# Patient Record
Sex: Male | Born: 1965 | Race: White | Hispanic: No | Marital: Married | Smoking: Former smoker
Health system: Southern US, Community
[De-identification: ages and names within clinical notes are randomized; demographics above are authoritative.]

## PROBLEM LIST (undated history)

## (undated) DIAGNOSIS — D649 Anemia, unspecified: Secondary | ICD-10-CM

## (undated) DIAGNOSIS — G4730 Sleep apnea, unspecified: Secondary | ICD-10-CM

## (undated) DIAGNOSIS — I10 Essential (primary) hypertension: Secondary | ICD-10-CM

## (undated) DIAGNOSIS — M549 Dorsalgia, unspecified: Secondary | ICD-10-CM

## (undated) DIAGNOSIS — M109 Gout, unspecified: Secondary | ICD-10-CM

## (undated) DIAGNOSIS — K5790 Diverticulosis of intestine, part unspecified, without perforation or abscess without bleeding: Secondary | ICD-10-CM

## (undated) DIAGNOSIS — F32A Depression, unspecified: Secondary | ICD-10-CM

## (undated) DIAGNOSIS — J449 Chronic obstructive pulmonary disease, unspecified: Secondary | ICD-10-CM

## (undated) DIAGNOSIS — K922 Gastrointestinal hemorrhage, unspecified: Secondary | ICD-10-CM

## (undated) DIAGNOSIS — R609 Edema, unspecified: Secondary | ICD-10-CM

## (undated) DIAGNOSIS — K219 Gastro-esophageal reflux disease without esophagitis: Secondary | ICD-10-CM

## (undated) DIAGNOSIS — E119 Type 2 diabetes mellitus without complications: Secondary | ICD-10-CM

## (undated) DIAGNOSIS — E785 Hyperlipidemia, unspecified: Secondary | ICD-10-CM

## (undated) DIAGNOSIS — D509 Iron deficiency anemia, unspecified: Secondary | ICD-10-CM

## (undated) DIAGNOSIS — F419 Anxiety disorder, unspecified: Secondary | ICD-10-CM

## (undated) DIAGNOSIS — M7989 Other specified soft tissue disorders: Secondary | ICD-10-CM

## (undated) DIAGNOSIS — F17200 Nicotine dependence, unspecified, uncomplicated: Secondary | ICD-10-CM

## (undated) HISTORY — PX: FOOT FASCIOTOMY: SHX1659

## (undated) HISTORY — DX: Dorsalgia, unspecified: M54.9

## (undated) HISTORY — DX: Edema, unspecified: R60.9

## (undated) HISTORY — DX: Hyperlipidemia, unspecified: E78.5

## (undated) HISTORY — DX: Other specified soft tissue disorders: M79.89

## (undated) HISTORY — DX: Anemia, unspecified: D64.9

## (undated) HISTORY — PX: COLONOSCOPY: SHX174

## (undated) HISTORY — DX: Depression, unspecified: F32.A

---

## 2005-06-02 HISTORY — PX: ORIF RADIUS & ULNA FRACTURES: SHX2129

## 2005-07-11 ENCOUNTER — Emergency Department (HOSPITAL_COMMUNITY): Admission: EM | Admit: 2005-07-11 | Discharge: 2005-07-11 | Payer: Self-pay | Admitting: Emergency Medicine

## 2005-08-21 ENCOUNTER — Emergency Department (HOSPITAL_COMMUNITY): Admission: EM | Admit: 2005-08-21 | Discharge: 2005-08-22 | Payer: Self-pay | Admitting: Emergency Medicine

## 2011-04-01 ENCOUNTER — Emergency Department (HOSPITAL_COMMUNITY): Payer: Self-pay

## 2011-04-01 ENCOUNTER — Emergency Department (HOSPITAL_COMMUNITY)
Admission: EM | Admit: 2011-04-01 | Discharge: 2011-04-01 | Disposition: A | Discharge status: Home / Self Care | Payer: Self-pay | Attending: Emergency Medicine | Admitting: Emergency Medicine

## 2011-04-01 DIAGNOSIS — R0989 Other specified symptoms and signs involving the circulatory and respiratory systems: Secondary | ICD-10-CM | POA: Insufficient documentation

## 2011-04-01 DIAGNOSIS — R141 Gas pain: Secondary | ICD-10-CM | POA: Insufficient documentation

## 2011-04-01 DIAGNOSIS — E119 Type 2 diabetes mellitus without complications: Secondary | ICD-10-CM | POA: Insufficient documentation

## 2011-04-01 DIAGNOSIS — F17200 Nicotine dependence, unspecified, uncomplicated: Secondary | ICD-10-CM | POA: Insufficient documentation

## 2011-04-01 DIAGNOSIS — Z79899 Other long term (current) drug therapy: Secondary | ICD-10-CM | POA: Insufficient documentation

## 2011-04-01 DIAGNOSIS — R142 Eructation: Secondary | ICD-10-CM | POA: Insufficient documentation

## 2011-04-01 DIAGNOSIS — I10 Essential (primary) hypertension: Secondary | ICD-10-CM | POA: Insufficient documentation

## 2011-04-01 DIAGNOSIS — R0609 Other forms of dyspnea: Secondary | ICD-10-CM | POA: Insufficient documentation

## 2011-04-01 DIAGNOSIS — R0602 Shortness of breath: Secondary | ICD-10-CM | POA: Insufficient documentation

## 2011-04-01 DIAGNOSIS — J449 Chronic obstructive pulmonary disease, unspecified: Secondary | ICD-10-CM | POA: Insufficient documentation

## 2011-04-01 DIAGNOSIS — J4489 Other specified chronic obstructive pulmonary disease: Secondary | ICD-10-CM | POA: Insufficient documentation

## 2011-04-01 LAB — COMPREHENSIVE METABOLIC PANEL
Albumin: 3.5 g/dL (ref 3.5–5.2)
BUN: 15 mg/dL (ref 6–23)
CO2: 23 mEq/L (ref 19–32)
Calcium: 9.2 mg/dL (ref 8.4–10.5)
Chloride: 93 mEq/L — ABNORMAL LOW (ref 96–112)
Creatinine, Ser: 0.71 mg/dL (ref 0.50–1.35)
GFR calc non Af Amer: >90 mL/min (ref >90)
Total Bilirubin: 0.2 mg/dL — ABNORMAL LOW (ref 0.3–1.2)

## 2011-04-01 LAB — DIFFERENTIAL
Basophils Absolute: 0.1 10*3/uL (ref 0.0–0.1)
Basophils Relative: 1 % (ref 0–1)
Eosinophils Absolute: 0.6 10*3/uL (ref 0.0–0.7)
Lymphocytes Relative: 21 % (ref 12–46)
Lymphs Abs: 2.3 10*3/uL (ref 0.7–4.0)
Monocytes Absolute: 0.7 10*3/uL (ref 0.1–1.0)
Monocytes Relative: 6 % (ref 3–12)
Neutro Abs: 7.3 10*3/uL (ref 1.7–7.7)
Neutrophils Relative %: 67 % (ref 43–77)

## 2011-04-01 LAB — CBC
Hemoglobin: 14.8 g/dL (ref 13.0–17.0)
MCV: 94.7 fL (ref 78.0–100.0)
WBC: 10.9 10*3/uL — ABNORMAL HIGH (ref 4.0–10.5)

## 2011-04-01 LAB — CK TOTAL AND CKMB (NOT AT ARMC): CK, MB: 2.3 ng/mL (ref 0.3–4.0)

## 2011-07-18 DIAGNOSIS — E119 Type 2 diabetes mellitus without complications: Secondary | ICD-10-CM | POA: Insufficient documentation

## 2014-03-01 ENCOUNTER — Emergency Department (HOSPITAL_COMMUNITY): Payer: 59

## 2014-03-01 ENCOUNTER — Emergency Department (HOSPITAL_COMMUNITY)
Admission: EM | Admit: 2014-03-01 | Discharge: 2014-03-01 | Disposition: A | Discharge status: Home / Self Care | Payer: 59 | Attending: Emergency Medicine | Admitting: Emergency Medicine

## 2014-03-01 ENCOUNTER — Encounter (HOSPITAL_COMMUNITY): Payer: Self-pay | Admitting: Emergency Medicine

## 2014-03-01 DIAGNOSIS — Z79899 Other long term (current) drug therapy: Secondary | ICD-10-CM | POA: Insufficient documentation

## 2014-03-01 DIAGNOSIS — F17200 Nicotine dependence, unspecified, uncomplicated: Secondary | ICD-10-CM | POA: Diagnosis not present

## 2014-03-01 DIAGNOSIS — I10 Essential (primary) hypertension: Secondary | ICD-10-CM | POA: Diagnosis not present

## 2014-03-01 DIAGNOSIS — E119 Type 2 diabetes mellitus without complications: Secondary | ICD-10-CM | POA: Insufficient documentation

## 2014-03-01 DIAGNOSIS — Z7982 Long term (current) use of aspirin: Secondary | ICD-10-CM | POA: Insufficient documentation

## 2014-03-01 DIAGNOSIS — M109 Gout, unspecified: Secondary | ICD-10-CM | POA: Diagnosis not present

## 2014-03-01 DIAGNOSIS — R51 Headache: Secondary | ICD-10-CM | POA: Insufficient documentation

## 2014-03-01 DIAGNOSIS — J0100 Acute maxillary sinusitis, unspecified: Secondary | ICD-10-CM | POA: Insufficient documentation

## 2014-03-01 HISTORY — DX: Essential (primary) hypertension: I10

## 2014-03-01 HISTORY — DX: Gout, unspecified: M10.9

## 2014-03-01 HISTORY — DX: Type 2 diabetes mellitus without complications: E11.9

## 2014-03-01 LAB — CBC WITH DIFFERENTIAL/PLATELET
BASOS ABS: 0.1 10*3/uL (ref 0.0–0.1)
BASOS PCT: 0 % (ref 0–1)
Eosinophils Absolute: 0.4 10*3/uL (ref 0.0–0.7)
Eosinophils Relative: 3 % (ref 0–5)
HEMATOCRIT: 40.1 % (ref 39.0–52.0)
Hemoglobin: 13.8 g/dL (ref 13.0–17.0)
Lymphocytes Relative: 12 % (ref 12–46)
Lymphs Abs: 1.8 10*3/uL (ref 0.7–4.0)
MCH: 30.9 pg (ref 26.0–34.0)
MCHC: 34.4 g/dL (ref 30.0–36.0)
MCV: 89.9 fL (ref 78.0–100.0)
MONOS PCT: 10 % (ref 3–12)
Monocytes Absolute: 1.4 10*3/uL — ABNORMAL HIGH (ref 0.1–1.0)
Neutro Abs: 11.1 10*3/uL — ABNORMAL HIGH (ref 1.7–7.7)
Neutrophils Relative %: 75 % (ref 43–77)
PLATELETS: 321 10*3/uL (ref 150–400)
RBC: 4.46 MIL/uL (ref 4.22–5.81)
RDW: 13.9 % (ref 11.5–15.5)
WBC: 14.8 10*3/uL — AB (ref 4.0–10.5)

## 2014-03-01 LAB — BASIC METABOLIC PANEL
ANION GAP: 16 — AB (ref 5–15)
BUN: 8 mg/dL (ref 6–23)
CALCIUM: 8.6 mg/dL (ref 8.4–10.5)
CHLORIDE: 95 meq/L — AB (ref 96–112)
CO2: 26 meq/L (ref 19–32)
Creatinine, Ser: 0.67 mg/dL (ref 0.50–1.35)
GFR calc Af Amer: >90 mL/min (ref >90)
GFR calc non Af Amer: >90 mL/min (ref >90)
Glucose, Bld: 166 mg/dL — ABNORMAL HIGH (ref 70–99)
POTASSIUM: 3.2 meq/L — AB (ref 3.7–5.3)
Sodium: 137 mEq/L (ref 137–147)

## 2014-03-01 MED ORDER — DIPHENHYDRAMINE HCL 50 MG/ML IJ SOLN
12.5000 mg | Freq: Once | INTRAMUSCULAR | Status: AC
  Administered 2014-03-01: 12.5 mg via INTRAVENOUS
  Filled 2014-03-01: qty 1

## 2014-03-01 MED ORDER — AMLODIPINE BESYLATE 5 MG PO TABS
5.0000 mg | ORAL_TABLET | Freq: Once | ORAL | Status: AC
  Administered 2014-03-01: 5 mg via ORAL
  Filled 2014-03-01: qty 1

## 2014-03-01 MED ORDER — PROCHLORPERAZINE EDISYLATE 5 MG/ML IJ SOLN
10.0000 mg | Freq: Once | INTRAMUSCULAR | Status: AC
  Administered 2014-03-01: 10 mg via INTRAVENOUS
  Filled 2014-03-01: qty 2

## 2014-03-01 MED ORDER — MORPHINE SULFATE 4 MG/ML IJ SOLN
4.0000 mg | Freq: Once | INTRAMUSCULAR | Status: AC
  Administered 2014-03-01: 4 mg via INTRAVENOUS
  Filled 2014-03-01: qty 1

## 2014-03-01 MED ORDER — HYDROCODONE-ACETAMINOPHEN 5-325 MG PO TABS: 1.0000 | ORAL_TABLET | ORAL | Status: DC | PRN

## 2014-03-01 MED ORDER — AMOXICILLIN-POT CLAVULANATE 875-125 MG PO TABS: 1.0000 | ORAL_TABLET | Freq: Two times a day (BID) | ORAL | Status: DC

## 2014-03-01 NOTE — ED Provider Notes (Signed)
CSN: [id number]     Arrival date & time 03/01/14  1907 History   First MD Initiated Contact with Patient 03/01/14 1941     Chief Complaint  Patient presents with  . Headache      HPI Patient presents to emergency room with complaints of a headache started several days ago. The headache is on the left side of his face. It was gradual in onset and has been increasing in severity. The pain is aching and intense like a toothache. Following his nose seems to make the pain worse. He has not had any fevers. He denies any neck pain. He denies any numbness or weakness. Patient does have a history of hypertension. He did take his medications today. Past Medical History  Diagnosis Date  . Hypertension   . Gout   . Diabetes mellitus without complication    History reviewed. No pertinent past surgical history. History reviewed. No pertinent family history. History  Substance Use Topics  . Smoking status: Current Every Day Smoker  . Smokeless tobacco: Not on file  . Alcohol Use: Yes    Review of Systems  All other systems reviewed and are negative.     Allergies  Review of patient's allergies indicates no known allergies.  Home Medications   Prior to Admission medications   Medication Sig Start Date End Date Taking? Authorizing Provider  acetaminophen-codeine (TYLENOL #3) 300-30 MG per tablet Take 1-2 tablets by mouth every 4 (four) hours as needed for moderate pain.   Yes Historical Provider, MD  allopurinol (ZYLOPRIM) 300 MG tablet Take 300 mg by mouth daily.   Yes Historical Provider, MD  ALPRAZolam Prudy Feeler) 0.5 MG tablet Take 0.5 mg by mouth at bedtime as needed for anxiety.   Yes Historical Provider, MD  amLODipine-atorvastatin (CADUET) 5-20 MG per tablet Take 1 tablet by mouth 2 (two) times daily.   Yes Historical Provider, MD  aspirin 81 MG tablet Take 81 mg by mouth daily.   Yes Historical Provider, MD  atorvastatin (LIPITOR) 10 MG tablet Take 10 mg by mouth daily.   Yes  Historical Provider, MD  azithromycin (ZITHROMAX) 250 MG tablet Take 250 mg by mouth See admin instructions. For 4 days. Completed on 02/25/14   Yes Historical Provider, MD  carvedilol (COREG) 25 MG tablet Take 25 mg by mouth 2 (two) times daily with a meal.   Yes Historical Provider, MD  furosemide (LASIX) 80 MG tablet Take 80 mg by mouth 2 (two) times daily.   Yes Historical Provider, MD  gabapentin (NEURONTIN) 600 MG tablet Take 600 mg by mouth at bedtime.   Yes Historical Provider, MD  glipiZIDE (GLUCOTROL) 5 MG tablet Take 5 mg by mouth 2 (two) times daily.   Yes Historical Provider, MD  losartan (COZAAR) 50 MG tablet Take 50 mg by mouth daily.   Yes Historical Provider, MD  metFORMIN (GLUCOPHAGE) 500 MG tablet Take 500 mg by mouth every morning.   Yes Historical Provider, MD  omeprazole (PRILOSEC) 40 MG capsule Take 40 mg by mouth daily.   Yes Historical Provider, MD  potassium chloride SA (K-DUR,KLOR-CON) 20 MEQ tablet Take 20 mEq by mouth 2 (two) times daily.   Yes Historical Provider, MD  amoxicillin-clavulanate (AUGMENTIN) 875-125 MG per tablet Take 1 tablet by mouth 2 (two) times daily. 03/01/14   Linwood Dibbles, MD  HYDROcodone-acetaminophen (NORCO/VICODIN) 5-325 MG per tablet Take 1-2 tablets by mouth every 4 (four) hours as needed. 03/01/14   Linwood Dibbles, MD   BP  208/87  Pulse 96  Temp(Src) 99.5 F (37.5 C) (Oral)  Resp 17  SpO2 97% Physical Exam  Nursing note and vitals reviewed. Constitutional: He appears well-developed and well-nourished. No distress.  Obese  HENT:  Head: Normocephalic and atraumatic.  Right Ear: External ear normal.  Left Ear: External ear normal.  No sinus tenderness  Eyes: Conjunctivae are normal. Right eye exhibits no discharge. Left eye exhibits no discharge. No scleral icterus.  Neck: Neck supple. No tracheal deviation present.  Cardiovascular: Normal rate, regular rhythm and intact distal pulses.   Pulmonary/Chest: Effort normal and breath sounds normal.  No stridor. No respiratory distress. He has no wheezes. He has no rales.  Abdominal: Soft. Bowel sounds are normal. He exhibits no distension. There is no tenderness. There is no rebound and no guarding.  Musculoskeletal: He exhibits no edema and no tenderness.  Neurological: He is alert. He has normal strength. No cranial nerve deficit (no facial droop, extraocular movements intact, no slurred speech) or sensory deficit. He exhibits normal muscle tone. He displays no seizure activity. Coordination normal.  Skin: Skin is warm and dry. No rash noted.  Psychiatric: He has a normal mood and affect.    ED Course  Procedures (including critical care time) Labs Review Labs Reviewed  CBC WITH DIFFERENTIAL - Abnormal; Notable for the following:    WBC 14.8 (*)    Neutro Abs 11.1 (*)    Monocytes Absolute 1.4 (*)    All other components within normal limits  BASIC METABOLIC PANEL - Abnormal; Notable for the following:    Potassium 3.2 (*)    Chloride 95 (*)    Glucose, Bld 166 (*)    Anion gap 16 (*)    All other components within normal limits    Imaging Review Ct Head Wo Contrast  03/01/2014   CLINICAL DATA:  Left-sided headache with sensation of facial pressure  EXAM: CT HEAD WITHOUT CONTRAST  TECHNIQUE: Contiguous axial images were obtained from the base of the skull through the vertex without intravenous contrast.  COMPARISON:  None.  FINDINGS: The ventricles are normal in size and position. There is no intracranial hemorrhage nor intracranial mass effect. There is subtle decreased density at the gray - white matter junction in the right frontal lobe demonstrated on images 21 through 23 which likely reflects chronic small vessel ischemic change. There is no acute intracranial hemorrhage. The cerebellum and brainstem are unremarkable.  There is a large amount of fluid filling the left maxillary and left ethmoid sinus cells. There is mucoperiosteal thickening within the left sphenoid sinus. The  frontal sinuses are clear. The mastoid air cells are well pneumatized. The calvarium is intact.  IMPRESSION: 1. Acute left maxillary, ethmoid, and sphenoid sinusitis. No bony erosive changes are demonstrated. 2. There is no acute intracranial abnormality. Hypodensity in the deep white matter of the right frontal lobe is consistent with chronic small vessel ischemic change which Vincelette be related to the patient's hypertension.   Electronically Signed   By: Arron  Swazil[place]   On: 03/01/2014 21:15    Medications  prochlorperazine (COMPAZINE) injection 10 mg (10 mg Intravenous Given 03/01/14 2027)  morphine 4 MG/ML injection 4 mg (4 mg Intravenous Given 03/01/14 2027)  amLODipine (NORVASC) tablet 5 mg (5 mg Oral Given 03/01/14 2038)  diphenhydrAMINE (BENADRYL) injection 12.5 mg (12.5 mg Intravenous Given 03/01/14 2130)     MDM   Final diagnoses:  Acute maxillary sinusitis, recurrence not specified    CT scan shows  acute sinusitis which correlates with his symptoms.  Pt has been on azithro and pcn recently for a dental infection.  Will dc home on augmentin and pain meds.    Linwood Dibbles, MD 03/01/14 2145

## 2014-03-01 NOTE — Discharge Instructions (Signed)

## 2014-03-01 NOTE — ED Notes (Signed)
Two weeks ago patient had an infected tooth, was treated with antibiotics and had a root canal, now he has head cold sx and is short of breath, pt's blood pressure is elevated in triage.

## 2014-03-01 NOTE — ED Notes (Signed)
Patient transported to CT 

## 2014-04-12 ENCOUNTER — Emergency Department (HOSPITAL_COMMUNITY)
Admission: EM | Admit: 2014-04-12 | Discharge: 2014-04-13 | Disposition: A | Discharge status: Home / Self Care | Payer: 59 | Attending: Emergency Medicine | Admitting: Emergency Medicine

## 2014-04-12 ENCOUNTER — Encounter (HOSPITAL_COMMUNITY): Payer: Self-pay

## 2014-04-12 DIAGNOSIS — E669 Obesity, unspecified: Secondary | ICD-10-CM | POA: Insufficient documentation

## 2014-04-12 DIAGNOSIS — M79605 Pain in left leg: Secondary | ICD-10-CM | POA: Diagnosis present

## 2014-04-12 DIAGNOSIS — Z720 Tobacco use: Secondary | ICD-10-CM | POA: Insufficient documentation

## 2014-04-12 DIAGNOSIS — I10 Essential (primary) hypertension: Secondary | ICD-10-CM | POA: Diagnosis not present

## 2014-04-12 DIAGNOSIS — M109 Gout, unspecified: Secondary | ICD-10-CM | POA: Insufficient documentation

## 2014-04-12 DIAGNOSIS — Z7982 Long term (current) use of aspirin: Secondary | ICD-10-CM | POA: Insufficient documentation

## 2014-04-12 DIAGNOSIS — I82402 Acute embolism and thrombosis of unspecified deep veins of left lower extremity: Secondary | ICD-10-CM | POA: Diagnosis not present

## 2014-04-12 DIAGNOSIS — Z79899 Other long term (current) drug therapy: Secondary | ICD-10-CM | POA: Diagnosis not present

## 2014-04-12 DIAGNOSIS — E119 Type 2 diabetes mellitus without complications: Secondary | ICD-10-CM | POA: Diagnosis not present

## 2014-04-12 DIAGNOSIS — Z792 Long term (current) use of antibiotics: Secondary | ICD-10-CM | POA: Insufficient documentation

## 2014-04-12 MED ORDER — ENOXAPARIN SODIUM 80 MG/0.8ML ~~LOC~~ SOLN
80.0000 mg | Freq: Once | SUBCUTANEOUS | Status: AC
  Administered 2014-04-13: 80 mg via SUBCUTANEOUS
  Filled 2014-04-12: qty 0.8

## 2014-04-12 MED ORDER — HYDROCODONE-ACETAMINOPHEN 5-325 MG PO TABS
2.0000 | ORAL_TABLET | Freq: Once | ORAL | Status: AC
  Administered 2014-04-12: 2 via ORAL
  Filled 2014-04-12: qty 2

## 2014-04-12 MED ORDER — HYDROCODONE-ACETAMINOPHEN 5-325 MG PO TABS: 1.0000 | ORAL_TABLET | Freq: Two times a day (BID) | ORAL | Status: DC | PRN

## 2014-04-12 MED ORDER — ENOXAPARIN SODIUM 150 MG/ML ~~LOC~~ SOLN: 1.0000 mg/kg | Freq: Once | SUBCUTANEOUS | Status: DC

## 2014-04-12 MED ORDER — ENOXAPARIN SODIUM 100 MG/ML ~~LOC~~ SOLN
85.0000 mg | Freq: Once | SUBCUTANEOUS | Status: AC
  Administered 2014-04-13: 85 mg via SUBCUTANEOUS
  Filled 2014-04-12: qty 1

## 2014-04-12 NOTE — ED Notes (Signed)
Pt complains of pain behind his left knee sown his leg, he's had a Baker's cyst before and says it feels the same, this am he states it was hard to apply pressure on that leg.

## 2014-04-12 NOTE — Discharge Instructions (Signed)
Raymond [name] Raymond [name], you were seen today for leg pain and swelling. You Wildey have a blood clot and need to follow-up with radiology tomorrow for an ultrasound. [facility] for the study. This [name] also be a Raymond's [name] which will be seen on ultrasound. If you have any worsening symptoms, chest pain or shortness of breath come back to emergency department and medially for repeat evaluation. Thank you. A Raymond [name] is a sac-like structure that forms in the back of the knee. It is filled with the same fluid that is located in your knee. This fluid lubricates the bones and cartilage of the knee and allows them to move over each other more easily. CAUSES  When the knee becomes injured or inflamed, increased fluid forms in the knee. When this happens, the joint lining is pushed out behind the knee and forms the Raymond [name]. This [name] also be caused by inflammation from arthritic conditions and infections. SIGNS AND SYMPTOMS  A Raymond [name] usually has no symptoms. When the [name] is substantially enlarged:  You [name] feel pressure behind the knee, stiffness in the knee, or a mass in the area behind the knee.  You [name] develop pain, redness, and swelling in the calf. This can suggest a blood clot and requires evaluation by your health care provider. DIAGNOSIS  A Raymond [name] is most often found during an ultrasound exam. This exam Eastmond have been performed for other reasons, and the [name] was found incidentally. Sometimes an MRI is used. This picks up other problems within a joint that an ultrasound exam [name] not. If the Raymond [name] developed immediately after an injury, X-ray exams [name] be used to diagnose the [name]. TREATMENT  The treatment depends on the cause of the [name]. Anti-inflammatory medicines and rest often will be prescribed. If the [name] is caused by a bacterial infection, antibiotic medicines [name] be prescribed.  HOME CARE INSTRUCTIONS   If the [name] was caused by an  injury, for the first 24 hours, keep the injured leg elevated on 2 pillows while lying down.  For the first 24 hours while you are awake, apply ice to the injured area:  Put ice in a plastic bag.  Place a towel between your skin and the bag.  Leave the ice on for 20 minutes, 2-3 times a day.  Only take over-the-counter or prescription medicines for pain, discomfort, or fever as directed by your health care provider.  Only take antibiotic medicine as directed. Make sure to finish it even if you start to feel better. MAKE SURE YOU:   Understand these instructions.  Will watch your condition.  Will get help right away if you are not doing well or get worse. Document Released: 05/19/2005 Document Revised: 03/09/2013 Document Reviewed: 12/29/2012 [facility] Patient Information 2015 [place], [place]. This information is not intended to replace advice given to you by your health care provider. Make sure you discuss any questions you have with your health care provider. Deep Vein Thrombosis A deep vein thrombosis (DVT) is a blood clot that develops in the deep, larger veins of the leg, arm, or pelvis. These are more dangerous than clots that might form in veins near the surface of the body. A DVT can lead to serious and even life-threatening complications if the clot breaks off and travels in the bloodstream to the lungs.  A DVT can damage the valves in your leg veins so that instead of flowing upward,  the blood pools in the lower leg. This is called post-thrombotic syndrome, and it can result in pain, swelling, discoloration, and sores on the leg. CAUSES Usually, several things contribute to the formation of blood clots. Contributing factors include:  The flow of blood slows down.  The inside of the vein is damaged in some way.  You have a condition that makes blood clot more easily. RISK FACTORS Some people are more likely than others to develop blood clots. Risk factors include:    Smoking.  Being overweight (obese).  Sitting or lying still for a long time. This includes long-distance travel, paralysis, or recovery from an illness or surgery. Other factors that increase risk are:   Older age, especially over 50 years of age.  Having a family history of blood clots or if you have already had a blot clot.  Having major or lengthy surgery. This is especially true for surgery on the hip, knee, or belly (abdomen). Hip surgery is particularly high risk.  Having a long, thin tube (catheter) placed inside a vein during a medical procedure.  Breaking a hip or leg.  Having cancer or cancer treatment.  Pregnancy and childbirth.  Hormone changes make the blood clot more easily during pregnancy.  The fetus puts pressure on the veins of the pelvis.  There is a risk of injury to veins during delivery or a caesarean delivery. The risk is highest just after childbirth.  Medicines containing the male hormone estrogen. This includes birth control pills and hormone replacement therapy.  Other circulation or heart problems.  SIGNS AND SYMPTOMS When a clot forms, it can either partially or totally block the blood flow in that vein. Symptoms of a DVT can include:  Swelling of the leg or arm, especially if one side is much worse.  Warmth and redness of the leg or arm, especially if one side is much worse.  Pain in an arm or leg. If the clot is in the leg, symptoms Deleonardis be more noticeable or worse when standing or walking. The symptoms of a DVT that has traveled to the lungs (pulmonary embolism, PE) usually start suddenly and include:  Shortness of breath.  Coughing.  Coughing up blood or blood-tinged mucus.  Chest pain. The chest pain is often worse with deep breaths.  Rapid heartbeat. Anyone with these symptoms should get emergency medical treatment right away. Do not wait to see if the symptoms will go away. Call your local emergency services (911 in the [place]  if you have these symptoms. Do not drive yourself to the hospital. DIAGNOSIS If a DVT is suspected, your health care provider will take a full medical history and perform a physical exam. Tests that also Towner be required include:  Blood tests, including studies of the clotting properties of the blood.  Ultrasound to see if you have clots in your legs or lungs.  X-rays to show the flow of blood when dye is injected into the veins (venogram).  Studies of your lungs if you have any chest symptoms. PREVENTION  Exercise the legs regularly. Take a brisk 30-minute walk every day.  Maintain a weight that is appropriate for your height.  Avoid sitting or lying in bed for long periods of time without moving your legs.  Women, particularly those over the age of 35 years, should consider the risks and benefits of taking estrogen medicines, including birth control pills.  Do not smoke, especially if you take estrogen medicines.  Long-distance travel can increase your  risk of DVT. You should exercise your legs by walking or pumping the muscles every hour.  Many of the risk factors above relate to situations that exist with hospitalization, either for illness, injury, or elective surgery. Prevention Jaggers include medical and nonmedical measures.  Your health care provider will assess you for the need for venous thromboembolism prevention when you are admitted to the hospital. If you are having surgery, your surgeon will assess you the day of or day after surgery. TREATMENT Once identified, a DVT can be treated. It can also be prevented in some circumstances. Once you have had a DVT, you Maler be at increased risk for a DVT in the future. The most common treatment for DVT is blood-thinning (anticoagulant) medicine, which reduces the blood's tendency to clot. Anticoagulants can stop new blood clots from forming and stop old clots from growing. They cannot dissolve existing clots. Your body does this by itself  over time. Anticoagulants can be given by mouth, through an IV tube, or by injection. Your health care provider will determine the best program for you. Other medicines or treatments that Rylander be used are:  Heparin or related medicines (low molecular weight heparin) are often the first treatment for a blood clot. They act quickly. However, they cannot be taken orally and must be given either in shot form or by IV tube.  Heparin can cause a fall in a component of blood that stops bleeding and forms blood clots (platelets). You will be monitored with blood tests to be sure this does not occur.  Warfarin is an anticoagulant that can be swallowed. It takes a few days to start working, so usually heparin or related medicines are used in combination. Once warfarin is working, heparin is usually stopped.  Factor Xa inhibitor medicines, such as rivaroxaban and apixaban, also reduce blood clotting. These medicines are taken orally and can often be used without heparin or related medicines.  Less commonly, clot dissolving drugs (thrombolytics) are used to dissolve a DVT. They carry a high risk of bleeding, so they are used mainly in severe cases where your life or a part of your body is threatened.  Very rarely, a blood clot in the leg needs to be removed surgically.  If you are unable to take anticoagulants, your health care provider Flener arrange for you to have a filter placed in a main vein in your abdomen. This filter prevents clots from traveling to your lungs. HOME CARE INSTRUCTIONS  Take all medicines as directed by your health care provider.  Learn as much as you can about DVT.  Wear a medical alert bracelet or carry a medical alert card.  Ask your health care provider how soon you can go back to normal activities. It is important to stay active to prevent blood clots. If you are on anticoagulant medicine, avoid contact sports.  It is very important to exercise. This is especially important  while traveling, sitting, or standing for long periods of time. Exercise your legs by walking or by tightening and relaxing your leg muscles regularly. Take frequent walks.  You Wrubel need to wear compression stockings. These are tight elastic stockings that apply pressure to the lower legs. This pressure can help keep the blood in the legs from clotting. Taking Warfarin Warfarin is a daily medicine that is taken by mouth. Your health care provider will advise you on the length of treatment (usually 3-6 months, sometimes lifelong). If you take warfarin:  Understand how to take warfarin  and foods that can affect how warfarin works in Public relations account executive.  Too much and too little warfarin are both dangerous. Too much warfarin increases the risk of bleeding. Too little warfarin continues to allow the risk for blood clots. Warfarin and Regular Blood Testing While taking warfarin, you will need to have regular blood tests to measure your blood clotting time. These blood tests usually include both the prothrombin time (PT) and international normalized ratio (INR) tests. The PT and INR results allow your health care provider to adjust your dose of warfarin. It is very important that you have your PT and INR tested as often as directed by your health care provider.  Warfarin and Your Diet Avoid major changes in your diet, or notify your health care provider before changing your diet. Arrange a visit with a registered dietitian to answer your questions. Many foods, especially foods high in vitamin K, can interfere with warfarin and affect the PT and INR results. You should eat a consistent amount of foods high in vitamin K. Foods high in vitamin K include:   Spinach, kale, broccoli, cabbage, collard and turnip greens, Brussels sprouts, peas, cauliflower, seaweed, and parsley.  Beef and pork liver.  Green tea.  Soybean oil. Warfarin with Other Medicines Many medicines can interfere with warfarin and affect the PT  and INR results. You must:  Tell your health care provider about any and all medicines, vitamins, and supplements you take, including aspirin and other over-the-counter anti-inflammatory medicines. Be especially cautious with aspirin and anti-inflammatory medicines. Ask your health care provider before taking these.  Do not take or discontinue any prescribed or over-the-counter medicine except on the advice of your health care provider or pharmacist. Warfarin Side Effects Warfarin can have side effects, such as easy bruising and difficulty stopping bleeding. Ask your health care provider or pharmacist about other side effects of warfarin. You will need to:  Hold pressure over cuts for longer than usual.  Notify your dentist and other health care providers that you are taking warfarin before you undergo any procedures where bleeding Krzywicki occur. Warfarin with Alcohol and Tobacco   Drinking alcohol frequently can increase the effect of warfarin, leading to excess bleeding. It is best to avoid alcoholic drinks or to consume only very small amounts while taking warfarin. Notify your health care provider if you change your alcohol intake.   Do not use any tobacco products including cigarettes, chewing tobacco, or electronic cigarettes. If you smoke, quit. Ask your health care provider for help with quitting smoking. Alternative Medicines to Warfarin: Factor Xa Inhibitor Medicines  These blood-thinning medicines are taken by mouth, usually for several weeks or longer. It is important to take the medicine every single day at the same time each day.  There are no regular blood tests required when using these medicines.  There are fewer food and drug interactions than with warfarin.  The side effects of this class of medicine are similar to those of warfarin, including excessive bruising or bleeding. Ask your health care provider or pharmacist about other potential side effects. SEEK MEDICAL CARE  IF:  You notice a rapid heartbeat.  You feel weaker or more tired than usual.  You feel faint.  You notice increased bruising.  You feel your symptoms are not getting better in the time expected.  You believe you are having side effects of medicine. SEEK IMMEDIATE MEDICAL CARE IF:  You have chest pain.  You have trouble breathing.  You have new or  increased swelling or pain in one leg.  You cough up blood.  You notice blood in vomit, in a bowel movement, or in urine. MAKE SURE YOU:  Understand these instructions.  Will watch your condition.  Will get help right away if you are not doing well or get worse. Document Released: 05/19/2005 Document Revised: 10/03/2013 Document Reviewed: 01/24/2013 [facility] Patient Information 2015 [place], [place]. This information is not intended to replace advice given to you by your health care provider. Make sure you discuss any questions you have with your health care provider.

## 2014-04-12 NOTE — ED Provider Notes (Signed)
CSN: [id number]     Arrival date & time 04/12/14  2217 History   First MD Initiated Contact with Patient 04/12/14 2258     Chief Complaint  Patient presents with  . Leg Pain     (Consider location/radiation/quality/duration/timing/severity/associated sxs/prior Treatment) HPI  Raymond [name] is a 48 y.o. male with past medical history of hypertension, diabetes coming in with left leg pain. Patient states it is gradually gotten worse over the past week. He describes pain in and around his knee. He denies any trauma. He states he's had a Baker's cyst 3 or 4 years ago and this feels similar. 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 He recently just returned from [place] this morning. He is tried Advil and Goody powder without any relief. Patient is denying any chest pain or shortness of breath. He has no further complaints.  10 Systems reviewed and are negative for acute change except as noted in the HPI.     Past Medical History  Diagnosis Date  . Hypertension   . Gout   . Diabetes mellitus without complication    History reviewed. No pertinent past surgical history. History reviewed. No pertinent family history. History  Substance Use Topics  . Smoking status: Current Every Day Smoker  . Smokeless tobacco: Not on file  . Alcohol Use: Yes    Review of Systems    Allergies  Review of patient's allergies indicates no known allergies.  Home Medications   Prior to Admission medications   Medication Sig Start Date End Date Taking? Authorizing Provider  acetaminophen-codeine (TYLENOL #3) 300-30 MG per tablet Take 1-2 tablets by mouth every 4 (four) hours as needed for moderate pain.    Historical Provider, MD  allopurinol (ZYLOPRIM) 300 MG tablet Take 300 mg by mouth daily.    Historical Provider, MD  ALPRAZolam Prudy Feeler(XANAX) 0.5 MG tablet Take 0.5 mg by mouth at bedtime as needed for anxiety.    Historical Provider, MD  amLODipine-atorvastatin  (CADUET) 5-20 MG per tablet Take 1 tablet by mouth 2 (two) times daily.    Historical Provider, MD  amoxicillin-clavulanate (AUGMENTIN) 875-125 MG per tablet Take 1 tablet by mouth 2 (two) times daily. 03/01/14   Linwood DibblesJon Knapp, MD  aspirin 81 MG tablet Take 81 mg by mouth daily.    Historical Provider, MD  atorvastatin (LIPITOR) 10 MG tablet Take 10 mg by mouth daily.    Historical Provider, MD  azithromycin (ZITHROMAX) 250 MG tablet Take 250 mg by mouth See admin instructions. For 4 days. Completed on 02/25/14    Historical Provider, MD  carvedilol (COREG) 25 MG tablet Take 25 mg by mouth 2 (two) times daily with a meal.    Historical Provider, MD  furosemide (LASIX) 80 MG tablet Take 80 mg by mouth 2 (two) times daily.    Historical Provider, MD  gabapentin (NEURONTIN) 600 MG tablet Take 600 mg by mouth at bedtime.    Historical Provider, MD  glipiZIDE (GLUCOTROL) 5 MG tablet Take 5 mg by mouth 2 (two) times daily.    Historical Provider, MD  HYDROcodone-acetaminophen (NORCO/VICODIN) 5-325 MG per tablet Take 1-2 tablets by mouth every 4 (four) hours as needed. 03/01/14   Linwood DibblesJon Knapp, MD  losartan (COZAAR) 50 MG tablet Take 50 mg by mouth daily.    Historical Provider, MD  metFORMIN (GLUCOPHAGE) 500 MG tablet Take 500 mg by mouth every morning.    Historical Provider, MD  omeprazole (PRILOSEC) 40  MG capsule Take 40 mg by mouth daily.    Historical Provider, MD  potassium chloride SA (K-DUR,KLOR-CON) 20 MEQ tablet Take 20 mEq by mouth 2 (two) times daily.    Historical Provider, MD   BP 173/87 mmHg  Pulse 80  Temp(Src) 98.9 F (37.2 C) (Oral)  Resp 16  Ht 5\' 11"  (1.803 m)  Wt 365 lb (165.563 kg)  BMI 50.93 kg/m2  SpO2 97% Physical Exam  Constitutional: He is oriented to person, place, and time. Vital signs are normal. He appears well-developed and well-nourished.  Non-toxic appearance. He does not appear ill. No distress.  Obese male  HENT:  Head: Normocephalic and atraumatic.  Nose: Nose normal.   Mouth/Throat: Oropharynx is clear and moist. No oropharyngeal exudate.  Eyes: Conjunctivae and EOM are normal. Pupils are equal, round, and reactive to light. No scleral icterus.  Neck: Normal range of motion. Neck supple. No tracheal deviation, no edema, no erythema and normal range of motion present. No thyroid mass and no thyromegaly present.  Cardiovascular: Normal rate, regular rhythm, S1 normal, S2 normal, normal heart sounds, intact distal pulses and normal pulses.  Exam reveals no gallop and no friction rub.   No murmur heard. Pulses:      Radial pulses are 2+ on the right side, and 2+ on the left side.       Dorsalis pedis pulses are 2+ on the right side, and 2+ on the left side.  Pulmonary/Chest: Effort normal and breath sounds normal. No respiratory distress. He has no wheezes. He has no rhonchi. He has no rales.  Abdominal: Soft. Normal appearance and bowel sounds are normal. He exhibits no distension, no ascites and no mass. There is no hepatosplenomegaly. There is no tenderness. There is no rebound, no guarding and no CVA tenderness.  Musculoskeletal: Normal range of motion. He exhibits edema and tenderness.  Left lower extremity with 2+ edema to the knee. Positive calf tenderness.  Left knee is not warm, swollen.  Lymphadenopathy:    He has no cervical adenopathy.  Neurological: He is alert and oriented to person, place, and time. He has normal strength. No cranial nerve deficit or sensory deficit. GCS eye subscore is 4. GCS verbal subscore is 5. GCS motor subscore is 6.  Skin: Skin is warm, dry and intact. No petechiae and no rash noted. He is not diaphoretic. No erythema. No pallor.  Psychiatric: He has a normal mood and affect. His behavior is normal. Judgment normal.  Nursing note and vitals reviewed.   ED Course  Procedures (including critical care time) Labs Review Labs Reviewed - No data to display  Imaging Review No results found.   EKG Interpretation None       MDM   Final diagnoses:  None    Patient since emergency department for left foot show any pain and swelling. Although this Mimnaugh be a Baker's cyst, DVT is in the differential as well. 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 He was given Lovenox in the emergency department and advised to follow-up tomorrow for an ultrasound study.  I have low concern for septic knee as the joint is not hot or swollen. There is no significant tenderness to palpation.  His vital signs remain within normal limits and he is safe for discharge.    Tomasita CrumbleAdeleke , MD 04/12/14 [phone]

## 2014-04-13 ENCOUNTER — Ambulatory Visit (HOSPITAL_COMMUNITY)
Admission: RE | Admit: 2014-04-13 | Discharge: 2014-04-13 | Disposition: A | Discharge status: Home / Self Care | Payer: 59 | Source: Ambulatory Visit | Attending: Surgery | Admitting: Surgery

## 2014-04-13 DIAGNOSIS — M7122 Synovial cyst of popliteal space [Baker], left knee: Secondary | ICD-10-CM | POA: Diagnosis not present

## 2014-04-13 DIAGNOSIS — M7989 Other specified soft tissue disorders: Secondary | ICD-10-CM | POA: Diagnosis present

## 2014-04-13 DIAGNOSIS — R609 Edema, unspecified: Secondary | ICD-10-CM

## 2014-04-13 NOTE — Progress Notes (Signed)
VASCULAR LAB PRELIMINARY  PRELIMINARY  PRELIMINARY  PRELIMINARY  Left lower extremity venous duplex completed.    Preliminary report:  Left:  No evidence of DVT or superficial thrombosis.  Bakers cyst noted in the popliteal fossa and into the proximal calf.  Lymph node noted in the left groin.  , , RVT 04/13/2014, 3:24 PM

## 2014-04-16 ENCOUNTER — Encounter (HOSPITAL_COMMUNITY): Payer: Self-pay | Admitting: Emergency Medicine

## 2014-04-16 ENCOUNTER — Emergency Department (HOSPITAL_COMMUNITY)
Admission: EM | Admit: 2014-04-16 | Discharge: 2014-04-16 | Disposition: A | Discharge status: Home / Self Care | Payer: 59 | Attending: Emergency Medicine | Admitting: Emergency Medicine

## 2014-04-16 DIAGNOSIS — E119 Type 2 diabetes mellitus without complications: Secondary | ICD-10-CM | POA: Diagnosis not present

## 2014-04-16 DIAGNOSIS — Z79899 Other long term (current) drug therapy: Secondary | ICD-10-CM | POA: Insufficient documentation

## 2014-04-16 DIAGNOSIS — Z7982 Long term (current) use of aspirin: Secondary | ICD-10-CM | POA: Diagnosis not present

## 2014-04-16 DIAGNOSIS — I10 Essential (primary) hypertension: Secondary | ICD-10-CM | POA: Insufficient documentation

## 2014-04-16 DIAGNOSIS — M7122 Synovial cyst of popliteal space [Baker], left knee: Secondary | ICD-10-CM

## 2014-04-16 DIAGNOSIS — Z792 Long term (current) use of antibiotics: Secondary | ICD-10-CM | POA: Diagnosis not present

## 2014-04-16 DIAGNOSIS — M109 Gout, unspecified: Secondary | ICD-10-CM | POA: Insufficient documentation

## 2014-04-16 DIAGNOSIS — M25562 Pain in left knee: Secondary | ICD-10-CM | POA: Diagnosis present

## 2014-04-16 DIAGNOSIS — Z720 Tobacco use: Secondary | ICD-10-CM | POA: Diagnosis not present

## 2014-04-16 MED ORDER — OXYCODONE-ACETAMINOPHEN 5-325 MG PO TABS: 2.0000 | ORAL_TABLET | ORAL | Status: DC | PRN

## 2014-04-16 NOTE — Discharge Instructions (Signed)
Raymond [name] °A Raymond [name] is a sac-like structure that forms in the back of the knee. It is filled with the same fluid that is located in your knee. This fluid lubricates the bones and cartilage of the knee and allows them to move over each other more easily. °CAUSES  °When the knee becomes injured or inflamed, increased fluid forms in the knee. When this happens, the joint lining is pushed out behind the knee and forms the Raymond [name]. This [name] Mechling also be caused by inflammation from arthritic conditions and infections. °SIGNS AND SYMPTOMS  °A Raymond [name] usually has no symptoms. When the [name] is substantially enlarged: °· You Grave feel pressure behind the knee, stiffness in the knee, or a mass in the area behind the knee. °· You Hanrahan develop pain, redness, and swelling in the calf.  This can suggest a blood clot and requires evaluation by your health care provider. °DIAGNOSIS  °A Raymond [name] is most often found during an ultrasound exam. This exam Deblanc have been performed for other reasons, and the [name] was found incidentally. Sometimes an MRI is used. This picks up other problems within a joint that an ultrasound exam Zeiss not. If the Raymond [name] developed immediately after an injury, X-ray exams Sivertsen be used to diagnose the [name]. °TREATMENT  °The treatment depends on the cause of the [name]. Anti-inflammatory medicines and rest often will be prescribed. If the [name] is caused by a bacterial infection, antibiotic medicines Man be prescribed.  °HOME CARE INSTRUCTIONS  °· If the [name] was caused by an injury, for the first 24 hours, keep the injured leg elevated on 2 pillows while lying down. °· For the first 24 hours while you are awake, apply ice to the injured area: °¨ Put ice in a plastic bag. °¨ Place a towel between your skin and the bag. °¨ Leave the ice on for 20 minutes, 2-3 times a day. °· Only take over-the-counter or prescription medicines for pain, discomfort, or fever as directed by your health care  provider. °· Only take antibiotic medicine as directed. Make sure to finish it even if you start to feel better. °MAKE SURE YOU:  °· Understand these instructions. °· Will watch your condition. °· Will get help right away if you are not doing well or get worse. °Document Released: 05/19/2005 Document Revised: 03/09/2013 Document Reviewed: 12/29/2012 °[employer]® Patient Information ©2015 [employer]. This information is not intended to replace advice given to you by your health care provider. Make sure you discuss any questions you have with your health care provider. ° °

## 2014-04-16 NOTE — ED Notes (Signed)
Pt reports that he had an ultrasound on r/leg. Dx of "Bakers Cyst" . Pt has been staying off the leg since Friday.

## 2014-04-16 NOTE — ED Notes (Signed)
Pt ambulatory with crutches upon DC. He was advised to follow up with orthopedics. Pt left with prescription in hand

## 2014-04-16 NOTE — ED Provider Notes (Signed)
CSN: [id number]     Arrival date & time 04/16/14  1136 History   First MD Initiated Contact with Patient 04/16/14 1141     Chief Complaint  Patient presents with  . Cyst    cyst on l/leg , behind the knee   HPI  Patient is a 48 y.o. Male with PMH of HTN, Gout, and DM who presents to the ED with left knee pain.  Patient states that the left knee and into the left calf has been bothering him for approximately 1.5 weeks.  He describes the pain as an 8/10 achey stabbing pain that is worse with walking and movement.  Patient states that this feels like when he had a bakers cyst in the past on his other leg.  Patient had an ultrasound of the left leg on 04/13/14 which diagnosed bakers cyst.  Patient had no evidence of DVT at that time.  Patient has been taking hydrocodone 2 5/325 mg hydrocodone every 4-6 hours.    Past Medical History  Diagnosis Date  . Hypertension   . Gout   . Diabetes mellitus without complication    Past Surgical History  Procedure Laterality Date  . Arm surgery     Family History  Problem Relation Age of Onset  . Diabetes Mother   . Hypertension Mother   . Diabetes Father   . Hypertension Father   . Cancer Other    History  Substance Use Topics  . Smoking status: Current Every Day Smoker  . Smokeless tobacco: Not on file  . Alcohol Use: Yes    Review of Systems  Constitutional: Negative for fever, chills and fatigue.  Gastrointestinal: Negative for nausea and vomiting.  Musculoskeletal: Positive for joint swelling, arthralgias and gait problem. Negative for myalgias, back pain, neck pain and neck stiffness.  Skin: Negative for rash.  All other systems reviewed and are negative.     Allergies  Review of patient's allergies indicates no known allergies.  Home Medications   Prior to Admission medications   Medication Sig Start Date End Date Taking? Authorizing Provider  acetaminophen-codeine (TYLENOL #3) 300-30 MG per tablet Take 1-2 tablets by mouth  every 4 (four) hours as needed for moderate pain.    Historical Provider, MD  allopurinol (ZYLOPRIM) 300 MG tablet Take 300 mg by mouth daily.    Historical Provider, MD  ALPRAZolam Prudy Feeler) 0.5 MG tablet Take 0.5 mg by mouth at bedtime as needed for anxiety.    Historical Provider, MD  amLODipine-atorvastatin (CADUET) 5-20 MG per tablet Take 1 tablet by mouth 2 (two) times daily.    Historical Provider, MD  amoxicillin-clavulanate (AUGMENTIN) 875-125 MG per tablet Take 1 tablet by mouth 2 (two) times daily. 03/01/14   Linwood Dibbles, MD  aspirin 81 MG tablet Take 81 mg by mouth daily.    Historical Provider, MD  Aspirin-Acetaminophen-Caffeine [phone] MG PACK Take 1 packet by mouth every 4 (four) hours as needed (pain).    Historical Provider, MD  atorvastatin (LIPITOR) 10 MG tablet Take 10 mg by mouth at bedtime.     Historical Provider, MD  azithromycin (ZITHROMAX) 250 MG tablet Take 250 mg by mouth See admin instructions. For 4 days. Completed on 02/25/14    Historical Provider, MD  carvedilol (COREG) 25 MG tablet Take 25 mg by mouth 2 (two) times daily with a meal.    Historical Provider, MD  furosemide (LASIX) 80 MG tablet Take 80 mg by mouth 2 (two) times daily.  Historical Provider, MD  gabapentin (NEURONTIN) 600 MG tablet Take 600 mg by mouth at bedtime.    Historical Provider, MD  glipiZIDE (GLUCOTROL) 5 MG tablet Take 5 mg by mouth 2 (two) times daily.    Historical Provider, MD  losartan (COZAAR) 50 MG tablet Take 50 mg by mouth daily.    Historical Provider, MD  metFORMIN (GLUCOPHAGE) 500 MG tablet Take 1,000 mg by mouth every morning.     Historical Provider, MD  omeprazole (PRILOSEC) 40 MG capsule Take 40 mg by mouth daily.    Historical Provider, MD  oxyCODONE-acetaminophen (PERCOCET) 5-325 MG per tablet Take 2 tablets by mouth every 4 (four) hours as needed. 04/16/14    A Forcucci, PA-C  potassium chloride SA (K-DUR,KLOR-CON) 20 MEQ tablet Take 20 mEq by mouth 2 (two) times  daily.    Historical Provider, MD   BP 152/84 mmHg  Pulse 70  Temp(Src) 98.1 F (36.7 C) (Oral)  Resp 20  Wt 365 lb (165.563 kg)  SpO2 96% Physical Exam  Constitutional: He is oriented to person, place, and time. He appears well-developed and well-nourished. No distress.  HENT:  Head: Normocephalic and atraumatic.  Mouth/Throat: Oropharynx is clear and moist. No oropharyngeal exudate.  Eyes: Conjunctivae and EOM are normal. Pupils are equal, round, and reactive to light. No scleral icterus.  Neck: Normal range of motion. Neck supple. No JVD present. No thyromegaly present.  Cardiovascular: Normal rate, regular rhythm, normal heart sounds and intact distal pulses.  Exam reveals no gallop and no friction rub.   No murmur heard. Pulses:      Dorsalis pedis pulses are 2+ on the right side, and 2+ on the left side.       Posterior tibial pulses are 2+ on the right side, and 2+ on the left side.  Pulmonary/Chest: Effort normal and breath sounds normal. No respiratory distress. He has no wheezes. He has no rales. He exhibits no tenderness.  Musculoskeletal:       Left knee: He exhibits swelling and effusion. He exhibits normal range of motion, no ecchymosis, no deformity, no laceration, no erythema, normal alignment, no LCL laxity, normal patellar mobility, no bony tenderness, normal meniscus and no MCL laxity. Tenderness found. Medial joint line and lateral joint line tenderness noted. No MCL, no LCL and no patellar tendon tenderness noted.       Left ankle: Normal.       Legs: Lymphadenopathy:    He has no cervical adenopathy.  Neurological: He is alert and oriented to person, place, and time.  Skin: Skin is warm and dry. He is not diaphoretic.  Psychiatric: He has a normal mood and affect. His behavior is normal. Judgment and thought content normal.  Nursing note and vitals reviewed.   ED Course  Procedures (including critical care time) Labs Review Labs Reviewed - No data to  display  Imaging Review No results found.   EKG Interpretation None      MDM   Final diagnoses:  Baker's cyst, left   Patient is a 48 y.o. Male who presents to the ED with left knee pain.  Physical exam reveals a non-toxic appearing male with normal vital signs.  Left knee has full painful ROM with joint line tenderness and posterior tenderness.  Doubt septic joint.  Patient does have diagnosis of bakers cyst from ultrasound on 04/13/14.  Suspect that this is the likely cause of pain.  There is no sign of infection at this time.  Will  discharge the patient home with percocet and crutches and will have him follow-up with Dr. Lequita HaltAluisio his orthopedist. Patient to return for septic joint symptoms.  Patient states understanding and agreement.  Patient is stable for discharge.       Eben Burowourtney A Forcucci, PA-C 04/16/14 1223  Vida RollerBrian D Miller, MD 04/16/14 [phone]

## 2014-06-02 ENCOUNTER — Emergency Department (HOSPITAL_COMMUNITY)
Admission: EM | Admit: 2014-06-02 | Discharge: 2014-06-02 | Disposition: A | Discharge status: Home / Self Care | Payer: PRIVATE HEALTH INSURANCE | Source: Home / Self Care | Attending: Emergency Medicine | Admitting: Emergency Medicine

## 2014-06-02 ENCOUNTER — Encounter (HOSPITAL_COMMUNITY): Payer: Self-pay | Admitting: Emergency Medicine

## 2014-06-02 ENCOUNTER — Ambulatory Visit (HOSPITAL_COMMUNITY): Payer: 59 | Attending: Emergency Medicine

## 2014-06-02 DIAGNOSIS — J209 Acute bronchitis, unspecified: Secondary | ICD-10-CM

## 2014-06-02 DIAGNOSIS — R05 Cough: Secondary | ICD-10-CM

## 2014-06-02 DIAGNOSIS — R059 Cough, unspecified: Secondary | ICD-10-CM

## 2014-06-02 DIAGNOSIS — R079 Chest pain, unspecified: Secondary | ICD-10-CM | POA: Insufficient documentation

## 2014-06-02 LAB — POCT I-STAT, CHEM 8
BUN: 10 mg/dL (ref 6–23)
CREATININE: 0.9 mg/dL (ref 0.50–1.35)
Calcium, Ion: 0.98 mmol/L — ABNORMAL LOW (ref 1.12–1.23)
Chloride: 99 mEq/L (ref 96–112)
GLUCOSE: 130 mg/dL — AB (ref 70–99)
HCT: 51 % (ref 39.0–52.0)
HEMOGLOBIN: 17.3 g/dL — AB (ref 13.0–17.0)
Potassium: 3.7 mmol/L (ref 3.5–5.1)
SODIUM: 139 mmol/L (ref 135–145)
TCO2: 25 mmol/L (ref 0–100)

## 2014-06-02 MED ORDER — PREDNISONE 20 MG PO TABS: 20.0000 mg | ORAL_TABLET | Freq: Two times a day (BID) | ORAL | Status: DC

## 2014-06-02 MED ORDER — DOXYCYCLINE HYCLATE 100 MG PO TABS: 100.0000 mg | ORAL_TABLET | Freq: Two times a day (BID) | ORAL | Status: DC

## 2014-06-02 MED ORDER — HYDROCOD POLST-CHLORPHEN POLST 10-8 MG/5ML PO LQCR: 5.0000 mL | Freq: Two times a day (BID) | ORAL | Status: DC | PRN

## 2014-06-02 MED ORDER — METHYLPREDNISOLONE ACETATE 80 MG/ML IJ SUSP
80.0000 mg | Freq: Once | INTRAMUSCULAR | Status: AC
  Administered 2014-06-02: 80 mg via INTRAMUSCULAR

## 2014-06-02 MED ORDER — ALBUTEROL SULFATE HFA 108 (90 BASE) MCG/ACT IN AERS: 1.0000 | INHALATION_SPRAY | Freq: Four times a day (QID) | RESPIRATORY_TRACT | Status: DC | PRN

## 2014-06-02 MED ORDER — METHYLPREDNISOLONE ACETATE 80 MG/ML IJ SUSP
INTRAMUSCULAR | Status: AC
  Filled 2014-06-02: qty 1

## 2014-06-02 NOTE — ED Notes (Signed)
Patient transported to X-ray 

## 2014-06-02 NOTE — ED Provider Notes (Signed)
Chief Complaint   URI   History of Present Illness   Raymond [name] is a 49 year old male who has had a 2 to three-week history of cough productive yellow-green sputum, tightness, wheezing, aching in his chest and upper back, chills, sweats, nasal congestion, and rhinorrhea. He's also had a headache, congested ears, watery eyes, posttussive vomiting, and diarrhea.  Review of Systems   Other than as noted above, the patient denies any of the following symptoms: Systemic:  No fevers, chills, sweats, or myalgias. Eye:  No redness or discharge. ENT:  No ear pain, headache, nasal congestion, drainage, sinus pressure, or sore throat. Neck:  No neck pain, stiffness, or swollen glands. Lungs:  No cough, sputum production, hemoptysis, wheezing, chest tightness, shortness of breath or chest pain. GI:  No abdominal pain, nausea, vomiting or diarrhea.  PMFSH   Past medical history, family history, social history, meds, and allergies were reviewed. He has no medication allergies. He has high blood pressure, diabetes, and sleep apnea. He smokes about 2 packs of cigarettes a day. Current meds include allopurinol, Xanax, aspirin, Lipitor, carvedilol, furosemide, gabapentin, glipizide, losartan, metformin, omeprazole, and potassium chloride.  Physical exam   Vital signs:  BP 158/81 mmHg  Pulse 92  Temp(Src) 98.5 F (36.9 C) (Oral)  Resp 18  SpO2 95% General:  Alert and oriented.  In no distress.  Skin warm and dry. Eye:  Slight conjunctival injection but no drainage. Lids were normal. ENT:  TMs and canals were normal, without erythema or inflammation.  Nasal mucosa was clear and uncongested, without drainage.  Mucous membranes were moist.  Pharynx was erythematous with no exudate or drainage.  There were no oral ulcerations or lesions. Neck:  Supple, no adenopathy, tenderness or mass. Lungs:  No respiratory distress.  Has scattered inspiratory wheezes bilaterally, no rales or rhonchi, good air  movement.  Heart:  Regular rhythm, without gallops, murmers or rubs. Skin:  Clear, warm, and dry, without rash or lesions.  Labs   Results for orders placed or performed during the hospital encounter of 06/02/14  I-STAT, chem 8  Result Value Ref Range   Sodium 139 135 - 145 mmol/L   Potassium 3.7 3.5 - 5.1 mmol/L   Chloride 99 96 - 112 mEq/L   BUN 10 6 - 23 mg/dL   Creatinine, Ser 1.61 0.50 - 1.35 mg/dL   Glucose, Bld 096 (H) 70 - 99 mg/dL   Calcium, Ion 0.45 (L) 1.12 - 1.23 mmol/L   TCO2 25 0 - 100 mmol/L   Hemoglobin 17.3 (H) 13.0 - 17.0 g/dL   HCT 40.9 81.1 - 91.4 %     Radiology   Dg Chest 2 View  06/02/2014   CLINICAL DATA:  Three week history of cough and intermittent left-sided chest pain.  EXAM: CHEST  2 VIEW  COMPARISON:  Portable chest x-ray 04/01/2011. Two-view chest x-ray 07/11/2005.  FINDINGS: Cardiomediastinal silhouette unremarkable, unchanged. Prominent bronchovascular markings diffusely and moderate to marked central peribronchial thickening, more so than on the prior examinations. Linear atelectasis or scarring in the lingula. Lungs otherwise clear. No localized airspace consolidation. No pleural effusions. No pneumothorax. Normal pulmonary vascularity. Visualized bony thorax intact.  IMPRESSION: Moderate to marked changes of acute bronchitis and/or asthma without focal airspace pneumonia. Linear atelectasis or scarring in the lingula.   Electronically Signed   By: Hulan Saas M.D.   On: 06/02/2014 18:43     Course in Urgent Care Center   The following medications were given:  Medications  methylPREDNISolone acetate (DEPO-MEDROL) injection 80 mg    Assessment     The primary encounter diagnosis was Acute bronchitis, unspecified organism. A diagnosis of Cough was also pertinent to this visit.  There is no evidence of pneumonia, strep throat, sinusitis, otitis media.    Plan    1.  Meds:  The following meds were prescribed:   New Prescriptions    ALBUTEROL (PROVENTIL HFA;VENTOLIN HFA) 108 (90 BASE) MCG/ACT INHALER    Inhale 1-2 puffs into the lungs every 6 (six) hours as needed for wheezing or shortness of breath.   CHLORPHENIRAMINE-HYDROCODONE (TUSSIONEX) 10-8 MG/5ML LQCR    Take 5 mLs by mouth every 12 (twelve) hours as needed for cough.   DOXYCYCLINE (VIBRA-TABS) 100 MG TABLET    Take 1 tablet (100 mg total) by mouth 2 (two) times daily.   PREDNISONE (DELTASONE) 20 MG TABLET    Take 1 tablet (20 mg total) by mouth 2 (two) times daily.    2.  Patient Education/Counseling:  The patient was given appropriate handouts, self care instructions, and instructed in symptomatic relief.  Instructed to get extra fluids and extra rest.  Strongly encouraged to quit cigarette smoking.  3.  Follow up:  The patient was told to follow up here if no better in 3 to 4 days, or sooner if becoming worse in any way, and given some red flag symptoms such as increasing fever, difficulty breathing, chest pain, or persistent vomiting which would prompt immediate return.       Reuben Likes, MD 06/02/14 [phone]

## 2014-06-02 NOTE — Discharge Instructions (Signed)
Acute Bronchitis °Bronchitis is inflammation of the airways that extend from the windpipe into the lungs (bronchi). The inflammation often causes mucus to develop. This leads to a cough, which is the most common symptom of bronchitis.  °In acute bronchitis, the condition usually develops suddenly and goes away over time, usually in a couple weeks. Smoking, allergies, and asthma can make bronchitis worse. Repeated episodes of bronchitis Antwine cause further lung problems.  °CAUSES °Acute bronchitis is most often caused by the same virus that causes a cold. The virus can spread from person to person (contagious) through coughing, sneezing, and touching contaminated objects. °SIGNS AND SYMPTOMS  °1. Cough.   °2. Fever.   °3. Coughing up mucus.   °4. Body aches.   °5. Chest congestion.   °6. Chills.   °7. Shortness of breath.   °8. Sore throat.   °DIAGNOSIS  °Acute bronchitis is usually diagnosed through a physical exam. Your health care provider will also ask you questions about your medical history. Tests, such as chest X-rays, are sometimes done to rule out other conditions.  °TREATMENT  °Acute bronchitis usually goes away in a couple weeks. Oftentimes, no medical treatment is necessary. Medicines are sometimes given for relief of fever or cough. Antibiotic medicines are usually not needed but Ollis be prescribed in certain situations. In some cases, an inhaler Dross be recommended to help reduce shortness of breath and control the cough. A cool mist vaporizer Haro also be used to help thin bronchial secretions and make it easier to clear the chest.  °HOME CARE INSTRUCTIONS °1. Get plenty of rest.   °2. Drink enough fluids to keep your urine clear or pale yellow (unless you have a medical condition that requires fluid restriction). Increasing fluids Denny help thin your respiratory secretions (sputum) and reduce chest congestion, and it will prevent dehydration.   °3. Take medicines only as directed by your health care  provider. °4. If you were prescribed an antibiotic medicine, finish it all even if you start to feel better. °5. Avoid smoking and secondhand smoke. Exposure to cigarette smoke or irritating chemicals will make bronchitis worse. If you are a smoker, consider using nicotine gum or skin patches to help control withdrawal symptoms. Quitting smoking will help your lungs heal faster.   °6. Reduce the chances of another bout of acute bronchitis by washing your hands frequently, avoiding people with cold symptoms, and trying not to touch your hands to your mouth, nose, or eyes.   °7. Keep all follow-up visits as directed by your health care provider.   °SEEK MEDICAL CARE IF: °Your symptoms do not improve after 1 week of treatment.  °SEEK IMMEDIATE MEDICAL CARE IF: °· You develop an increased fever or chills.   °· You have chest pain.   °· You have severe shortness of breath. °· You have bloody sputum.   °· You develop dehydration. °· You faint or repeatedly feel like you are going to pass out. °· You develop repeated vomiting. °· You develop a severe headache. °MAKE SURE YOU:  °· Understand these instructions. °· Will watch your condition. °· Will get help right away if you are not doing well or get worse. °Document Released: 06/26/2004 Document Revised: 10/03/2013 Document Reviewed: 11/09/2012 °ExitCare® Patient Information ©2015 [employer]. This information is not intended to replace advice given to you by your health care provider. Make sure you discuss any questions you have with your health care provider. ° °How to Use an Inhaler °Proper inhaler technique is very important. Good technique ensures that the medicine reaches the lungs. Poor technique results   in depositing the medicine on the tongue and back of the throat rather than in the airways. If you do not use the inhaler with good technique, the medicine will not help you. °STEPS TO FOLLOW IF USING AN INHALER WITHOUT AN EXTENSION TUBE °9. Remove the cap from  the inhaler. °10. If you are using the inhaler for the first time, you will need to prime it. Shake the inhaler for 5 seconds and release four puffs into the air, away from your face. Ask your health care provider or pharmacist if you have questions about priming your inhaler. °11. Shake the inhaler for 5 seconds before each breath in (inhalation). °12. Position the inhaler so that the top of the canister faces up. °13. Put your index finger on the top of the medicine canister. Your thumb supports the bottom of the inhaler. °14. Open your mouth. °15. Either place the inhaler between your teeth and place your lips tightly around the mouthpiece, or hold the inhaler 1-2 inches away from your open mouth. If you are unsure of which technique to use, ask your health care provider. °16. Breathe out (exhale) normally and as completely as possible. °17. Press the canister down with your index finger to release the medicine. °18. At the same time as the canister is pressed, inhale deeply and slowly until your lungs are completely filled. This should take 4-6 seconds. Keep your tongue down. °19. Hold the medicine in your lungs for 5-10 seconds (10 seconds is best). This helps the medicine get into the small airways of your lungs. °20. Breathe out slowly, through pursed lips. Whistling is an example of pursed lips. °21. Wait at least 15-30 seconds between puffs. Continue with the above steps until you have taken the number of puffs your health care provider has ordered. Do not use the inhaler more than your health care provider tells you. °22. Replace the cap on the inhaler. °23. Follow the directions from your health care provider or the inhaler insert for cleaning the inhaler. °STEPS TO FOLLOW IF USING AN INHALER WITH AN EXTENSION (SPACER) °8. Remove the cap from the inhaler. °9. If you are using the inhaler for the first time, you will need to prime it. Shake the inhaler for 5 seconds and release four puffs into the air,  away from your face. Ask your health care provider or pharmacist if you have questions about priming your inhaler. °10. Shake the inhaler for 5 seconds before each breath in (inhalation). °11. Place the open end of the spacer onto the mouthpiece of the inhaler. °12. Position the inhaler so that the top of the canister faces up and the spacer mouthpiece faces you. °13. Put your index finger on the top of the medicine canister. Your thumb supports the bottom of the inhaler and the spacer. °14. Breathe out (exhale) normally and as completely as possible. °15. Immediately after exhaling, place the spacer between your teeth and into your mouth. Close your lips tightly around the spacer. °16. Press the canister down with your index finger to release the medicine. °17. At the same time as the canister is pressed, inhale deeply and slowly until your lungs are completely filled. This should take 4-6 seconds. Keep your tongue down and out of the way. °18. Hold the medicine in your lungs for 5-10 seconds (10 seconds is best). This helps the medicine get into the small airways of your lungs. Exhale. °19. Repeat inhaling deeply through the spacer mouthpiece. Again hold that breath   for up to 10 seconds (10 seconds is best). Exhale slowly. If it is difficult to take this second deep breath through the spacer, breathe normally several times through the spacer. Remove the spacer from your mouth. °20. Wait at least 15-30 seconds between puffs. Continue with the above steps until you have taken the number of puffs your health care provider has ordered. Do not use the inhaler more than your health care provider tells you. °21. Remove the spacer from the inhaler, and place the cap on the inhaler. °22. Follow the directions from your health care provider or the inhaler insert for cleaning the inhaler and spacer. °If you are using different kinds of inhalers, use your quick relief medicine to open the airways 10-15 minutes before using a  steroid if instructed to do so by your health care provider. If you are unsure which inhalers to use and the order of using them, ask your health care provider, nurse, or respiratory therapist. °If you are using a steroid inhaler, always rinse your mouth with water after your last puff, then gargle and spit out the water. Do not swallow the water. °AVOID: °· Inhaling before or after starting the spray of medicine. It takes practice to coordinate your breathing with triggering the spray. °· Inhaling through the nose (rather than the mouth) when triggering the spray. °HOW TO DETERMINE IF YOUR INHALER IS FULL OR NEARLY EMPTY °You cannot know when an inhaler is empty by shaking it. A few inhalers are now being made with dose counters. Ask your health care provider for a prescription that has a dose counter if you feel you need that extra help. If your inhaler does not have a counter, ask your health care provider to help you determine the date you need to refill your inhaler. Write the refill date on a calendar or your inhaler canister. Refill your inhaler 7-10 days before it runs out. Be sure to keep an adequate supply of medicine. This includes making sure it is not expired, and that you have a spare inhaler.  °SEEK MEDICAL CARE IF:  °· Your symptoms are only partially relieved with your inhaler. °· You are having trouble using your inhaler. °· You have some increase in phlegm. °SEEK IMMEDIATE MEDICAL CARE IF:  °· You feel little or no relief with your inhalers. You are still wheezing and are feeling shortness of breath or tightness in your chest or both. °· You have dizziness, headaches, or a fast heart rate. °· You have chills, fever, or night sweats. °· You have a noticeable increase in phlegm production, or there is blood in the phlegm. °MAKE SURE YOU:  °· Understand these instructions. °· Will watch your condition. °· Will get help right away if you are not doing well or get worse. °Document Released: 05/16/2000  Document Revised: 03/09/2013 Document Reviewed: 12/16/2012 °ExitCare® Patient Information ©2015 [employer]. This information is not intended to replace advice given to you by your health care provider. Make sure you discuss any questions you have with your health care provider. ° °How to Quit Smoking ° °According to the U.[place]. Surgeon General, about 440,000 people in the [place] alone die from complications related to tobacco use.  More deaths occur due to cigarette smoking than illegal drug use, AIDS, car accidents, alcohol-related deaths, suicide and homicide combined.  Smoking accounts for about 30% of all cancer related deaths, including more than 80% of lung cancer deaths. Smoking has also been linked as the cause of   many other diseases like heart disease, bronchitis, emphysema, stroke, and complications of pneumonia as well as causing an increased risk of miscarriage, premature births, stillbirth, infant death, and low birth weight in infants. ° °For this reason, the U.[place]. Surgeon General recommends: ° °"Smoking cessation (stopping smoking) represents the single most important step that smokers can take to enhance the length and quality of their lives." ° °Why is it so hard to Quit? ° °Tobacco products contain Nicotine which is highly addictive - probably as addictive as heroin or cocaine.  Over time, your body becomes both physically and psychologically dependent on it. Finally, attempts to quit smoking are complicated by withdrawal reactions like depression, irritability, trouble sleeping, trouble concentrating, restlessness, headaches, weight gain, and excessive fatigue as well as a lack of support.  These symptoms can last from a few days to several weeks. ° °Why should you Quit? °· Live longer and healthier °· Can improves the health of your housemates (children, spouse) °· Increases your energy and breathing ability °· Lowers risk of heart attack, stroke and cancer °· Saves money - For example, if  you smoke a pack of cigarettes a day and each pack costs about $3.00, then you will save about $1,100.00 per year, about $5,500. in 5 years and $11,000.00 in 10 years. ° °What you can do: °1. Talk to your health care provider - There are many smoking cessations aids available, both prescription or over-the-counter.  Check with your doctor and pharmacist before taking any of these products to see which one is best for you.  Develop a plan with your healthcare provider which Alvidrez include nicotine replacement, prescription medication and/or counseling. ° °2. Get Started - Mark a start date on your calendar.  Remove cigarettes and ashtrays from your home, car, and office.  Don’t be around other smokers.  Stop smoking…not even a puff! ° °3. Support - Talk to family, friends, and co-workers about your plan to stop smoking.  Ask them not to smoke around you. ° °4. Coping Strategies ° °The four “A”s to help during tough times: ° °a. Avoid - Avoid other smokers or places where smoking is commonplace.  Avoid alcoholic beverages as these Blossom increase your desire to smoke °b. Alter - Change your routine.  Drink water/juices instead of alcohol or coffee.  Take a walk or visit with someone during your coffee break.  Change your route to work. °c. Alternatives - Substitute raw vegetables like carrot sticks or celery, sugarless candy or gum for the habit of having a cigarette. °d. Activities - Start an exercise program (talk to your doctor prior to beginning any exercise program). Try out a new “hands on” hobby to distract you from smoking and to keep your hands busy like woodworking or needlepoint. ° ° Additional tips for specific withdrawal symptoms: ° ° Cravings for tobacco:  — Distract yourself  °     — Deep-breathing exercises  °     — Remember that cravings are brief  ° ° Irritability:   — Take a few slow, deep breaths °     — Soak in a hot bath °  ° Insomnia:   — Take a walk several hours before bedtime °     — Avoid  caffeinated beverages after noon °     — Read a book °     — Take a warm bath °     — Banana or warm milk °  ° Increased appetite:  — Drink   water or low-calorie drinks °     — Make a survival Kit: Include straws, cinnamon        sticks,  °     — coffee stirrers, licorice, toothpicks, gum, or fresh °        vegetables °  ° Inability to concentrate: — Take a brisk walk °     — Lighten your schedule for a couple of days °     — Take more breaks ° ° Fatigue:   — Get a good night’s sleep °     — Take a nap °     — Don’t overdo it for 2-4 weeks °  ° Constipation, gas, ° stomach pain:   — Drink plenty of fluids °     — Increase fiber: fruit, raw vegetables, whole grain        cereals °     — Talk to your doctor about diet changes ° ° ° °5. Dealing with Relapses - Most relapses occur within the first 2-3 months.  This is common so don’t be discouraged.  Some people Hassey take several attempts before they can quit smoking completely. ° °6. Reward yourself - Set-up a rewards program for every milestone, like 1st month after quitting, 3rd month after quitting, and 6th month after quitting to keep you motivated. ° °What your doctor can do: °Perform a physical exam and order diagnostic tests like laboratory blood work and a chest X-ray.  This will help to identify health related conditions that might benefit from smoking cessation. °Review your health history to make sure there are no contraindications with specific smoking cessation aids like allergies to medications or ingredients in these medications or conflicts with your current medications. °Prescribe smoking cessation aids such as: °Over-the-counter aid:  Nicotine gum, Nicotine patch °Prescription aids:  Nicotine spray, Nicotine inhaler, or Bupropion SR (non-nicotine). °Offer or recommend individual or group counseling to support you during the initial quitting and maintenance phase of your smoking cessation program. °Offer or recommend other alternative treatments like  hypnosis or acupuncture. ° °What you can expect: °Benefits from quitting smoking: °· Improved Physical Appearance - Minimizes or stops premature wrinkling of skin, bad breath, stained teeth, gum disease, clothes/hair “smoke” odors, and yellow fingernails °· Improved Daily Activities - Food tastes better, sense of smell improves, and decreases shortness of breath during ordinary activities like climbing stairs, walking, and performing light housework °· Decreased Financial Cost - From both no longer purchasing cigarettes and the health care cost for medical treatment of conditions caused by smoking. °· Health of Others - Decreases risk of exposing others to the effects of second hand smoke.  Sets an example for youth. °Benefits of Quitting according to research from the U.S. Surgeon General: °· 20 minutes after:  Blood pressure lowers & Body temperature normalizes °· 8 hours after: Carbon monoxide levels begins to normalize  °· 24 hours after: Heart attack risk decreases °· 2 weeks to 3 months after:  Blood flow improves & lung function increases  °· 1 to 9 months after:  Coughing, sinus congestion, fatigue, shortness of breath decrease  °· 1 year after: Risk of developing coronary heart disease is half that of a smoker °· 5 years after: Risk of a stroke decreases to that of a nonsmoker °· 10 years after: Risk of death due to lung cancer death is halved.  Risk of oral, throat, esophagus, bladder, kidney, and pancreatic cancer decreases. °· 15 years after: Risk of developing   coronary heart disease is half that of a nonsmoker ° ° ° ° ° °References: ° °Here are references that can provide additional information and support: ° °American Cancer Society     American Heart Association °(800) ACS-2345       (800) 242-1793 or (800) 242-1793 °www.cancer.org       www.amhrt.org ° °American Lung Association    American Academy of Medical Acupuncture °(800) 586-4872 or (212) 315-8700  (800) 521-2262 or (323)  937-5514 °www.lungusa.org       www.medicalacupuncture.org ° °National Cancer Institute     Office on Smoking & Health °Cancer Information Service    Centers for Disease Control and Prevention °(800) 4-CANCER or (800) 422-6237  (770) 448-5705 °www.cancer.gov       www.cdc.gov/tobacco ° °Nicotine Anonymous      Smokefree.gov °(877) TRY-NICA (877-879-6422)   (877) 44U-QUIT (877-448-7848) °www.nicotine-anonymous.org   www.smokefree.gov ° °Contact your doctor or pharmacist if you have specific questions about starting a smoking cessation program. ° ° °

## 2014-06-02 NOTE — ED Notes (Signed)
C/o cold sx onset 2-3 weeks Sx include: prod cough, back pain due to cough, SOB, wheezing, v/d Denies fevers Taking OTC cold meds w/no relief Alert, no signs of acute distress.

## 2014-06-26 ENCOUNTER — Encounter (HOSPITAL_BASED_OUTPATIENT_CLINIC_OR_DEPARTMENT_OTHER): Payer: Self-pay | Admitting: *Deleted

## 2014-06-26 NOTE — Progress Notes (Signed)
To seeing pcp today for clearance-will see if they can do ekg-bmet-pt to bring meds and cpap

## 2014-06-26 NOTE — Progress Notes (Signed)
Called pt pcp-if they do labs ekg-give him copy-he has to come by for airway ck with bmi 50.9

## 2014-06-27 ENCOUNTER — Ambulatory Visit: Payer: Self-pay | Admitting: Physician Assistant

## 2014-06-27 NOTE — H&P (Signed)
Raymond [name] is an 48 y.o. male.   Chief Complaint: left knee pain  HPI: Patient is a 48 year-old male who we have seen previously with left knee pain.  He did have some medial compartment narrowing and we had elected to treat with Corticosteroid injection.  He states he received good relief, but it only lasted approximately one month.  Also now with right knee complaints.  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  Patient is at his wits end.  He describes occasional locking sensation with the left knee.  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  Past Medical History  Diagnosis Date  . Hypertension   . Gout   . Diabetes mellitus without complication   . Sleep apnea     uses a cpap  . Heavy smoker   . COPD (chronic obstructive pulmonary disease)   . GERD (gastroesophageal reflux disease)   . Anxiety     Past Surgical History  Procedure Laterality Date  . Orif radius & ulna fractures  2007    left  . Colonoscopy    . Foot fasciotomy      age 10-rt    Family History  Problem Relation Age of Onset  . Diabetes Mother   . Hypertension Mother   . Diabetes Father   . Hypertension Father   . Cancer Other    Social History:  reports that he has been smoking.  He does not have any smokeless tobacco history on file. He reports that he drinks alcohol. He reports that he does not use illicit drugs.  Allergies: No Known Allergies   (Not in a hospital admission)  No results found for this or any previous visit (from the past 48 hour(s)). No results found.  Review of Systems  Constitutional: Negative.   HENT: Positive for tinnitus. Negative for congestion, ear discharge, ear pain, hearing loss, nosebleeds and sore throat.   Eyes: Negative.   Respiratory: Negative.  Negative for stridor.    Cardiovascular: Negative.   Gastrointestinal: Negative.   Genitourinary: Negative.   Musculoskeletal: Positive for joint pain.  Skin: Negative.   Neurological: Negative.  Negative for headaches.  Endo/Heme/Allergies: Negative.   Psychiatric/Behavioral: Negative.     There were no vitals taken for this visit. Physical Exam  Constitutional: He is oriented to person, place, and time. He appears well-developed and well-nourished. No distress.  HENT:  Head: Normocephalic and atraumatic.  Nose: Nose normal.  Eyes: Conjunctivae and EOM are normal. Pupils are equal, round, and reactive to light.  Neck: Normal range of motion. Neck supple.  Cardiovascular: Normal rate and intact distal pulses.   Respiratory: Effort normal. No respiratory distress.  GI: Soft. He exhibits no distension. There is no tenderness.  obese  Musculoskeletal:       Left knee: He exhibits decreased range of motion and swelling. He exhibits no erythema, no LCL laxity and no MCL laxity. Tenderness found. Medial joint line and lateral joint line tenderness noted.  Lymphadenopathy:    He has no cervical adenopathy.  Neurological: He is alert and oriented to person, place, and time. No cranial nerve deficit.  Skin: Skin is warm and dry. No rash noted. No erythema.  Psychiatric: He has a normal mood and affect. His behavior is normal.       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  Will set up as soon as possible.  ,  06/27/2014, 3:01 PM    

## 2014-06-27 NOTE — Pre-Procedure Instructions (Signed)
Pt. was seen by Dr. Michelle Piperssey for anesthesia airway evaluation; pt. OK to come for surgery.

## 2014-06-28 ENCOUNTER — Ambulatory Visit (HOSPITAL_BASED_OUTPATIENT_CLINIC_OR_DEPARTMENT_OTHER): Payer: 59 | Admitting: Anesthesiology

## 2014-06-28 ENCOUNTER — Encounter (HOSPITAL_BASED_OUTPATIENT_CLINIC_OR_DEPARTMENT_OTHER)
Admission: RE | Disposition: A | Discharge status: Home / Self Care | Payer: Self-pay | Source: Ambulatory Visit | Attending: Orthopedic Surgery

## 2014-06-28 ENCOUNTER — Ambulatory Visit (HOSPITAL_BASED_OUTPATIENT_CLINIC_OR_DEPARTMENT_OTHER)
Admission: RE | Admit: 2014-06-28 | Discharge: 2014-06-28 | Disposition: A | Discharge status: Home / Self Care | Payer: 59 | Source: Ambulatory Visit | Attending: Orthopedic Surgery | Admitting: Orthopedic Surgery

## 2014-06-28 ENCOUNTER — Encounter (HOSPITAL_BASED_OUTPATIENT_CLINIC_OR_DEPARTMENT_OTHER): Payer: Self-pay | Admitting: *Deleted

## 2014-06-28 DIAGNOSIS — E119 Type 2 diabetes mellitus without complications: Secondary | ICD-10-CM | POA: Insufficient documentation

## 2014-06-28 DIAGNOSIS — Y998 Other external cause status: Secondary | ICD-10-CM | POA: Insufficient documentation

## 2014-06-28 DIAGNOSIS — S83282A Other tear of lateral meniscus, current injury, left knee, initial encounter: Secondary | ICD-10-CM | POA: Insufficient documentation

## 2014-06-28 DIAGNOSIS — F17210 Nicotine dependence, cigarettes, uncomplicated: Secondary | ICD-10-CM | POA: Diagnosis not present

## 2014-06-28 DIAGNOSIS — G4730 Sleep apnea, unspecified: Secondary | ICD-10-CM | POA: Insufficient documentation

## 2014-06-28 DIAGNOSIS — I10 Essential (primary) hypertension: Secondary | ICD-10-CM | POA: Insufficient documentation

## 2014-06-28 DIAGNOSIS — Y9389 Activity, other specified: Secondary | ICD-10-CM | POA: Insufficient documentation

## 2014-06-28 DIAGNOSIS — X58XXXA Exposure to other specified factors, initial encounter: Secondary | ICD-10-CM | POA: Insufficient documentation

## 2014-06-28 DIAGNOSIS — K219 Gastro-esophageal reflux disease without esophagitis: Secondary | ICD-10-CM | POA: Insufficient documentation

## 2014-06-28 DIAGNOSIS — J449 Chronic obstructive pulmonary disease, unspecified: Secondary | ICD-10-CM | POA: Insufficient documentation

## 2014-06-28 DIAGNOSIS — Y9289 Other specified places as the place of occurrence of the external cause: Secondary | ICD-10-CM | POA: Insufficient documentation

## 2014-06-28 DIAGNOSIS — F419 Anxiety disorder, unspecified: Secondary | ICD-10-CM | POA: Diagnosis not present

## 2014-06-28 DIAGNOSIS — M109 Gout, unspecified: Secondary | ICD-10-CM | POA: Insufficient documentation

## 2014-06-28 DIAGNOSIS — M2242 Chondromalacia patellae, left knee: Secondary | ICD-10-CM | POA: Diagnosis not present

## 2014-06-28 DIAGNOSIS — S83242A Other tear of medial meniscus, current injury, left knee, initial encounter: Secondary | ICD-10-CM | POA: Insufficient documentation

## 2014-06-28 HISTORY — DX: Nicotine dependence, unspecified, uncomplicated: F17.200

## 2014-06-28 HISTORY — DX: Chronic obstructive pulmonary disease, unspecified: J44.9

## 2014-06-28 HISTORY — DX: Anxiety disorder, unspecified: F41.9

## 2014-06-28 HISTORY — DX: Gastro-esophageal reflux disease without esophagitis: K21.9

## 2014-06-28 HISTORY — PX: KNEE ARTHROSCOPY WITH LATERAL MENISECTOMY: SHX6193

## 2014-06-28 HISTORY — DX: Sleep apnea, unspecified: G47.30

## 2014-06-28 HISTORY — PX: KNEE ARTHROSCOPY WITH MEDIAL MENISECTOMY: SHX5651

## 2014-06-28 HISTORY — PX: CHONDROPLASTY: SHX5177

## 2014-06-28 LAB — POCT HEMOGLOBIN-HEMACUE: Hemoglobin: 14.3 g/dL (ref 13.0–17.0)

## 2014-06-28 LAB — GLUCOSE, CAPILLARY
GLUCOSE-CAPILLARY: 155 mg/dL — AB (ref 70–99)
Glucose-Capillary: 137 mg/dL — ABNORMAL HIGH (ref 70–99)

## 2014-06-28 SURGERY — ARTHROSCOPY, KNEE, WITH MEDIAL MENISCECTOMY
Anesthesia: Regional | Site: Knee | Laterality: Left

## 2014-06-28 MED ORDER — PROPOFOL 10 MG/ML IV BOLUS
INTRAVENOUS | Status: AC
  Filled 2014-06-28: qty 20

## 2014-06-28 MED ORDER — ROPIVACAINE HCL 5 MG/ML IJ SOLN
INTRAMUSCULAR | Status: DC | PRN
  Administered 2014-06-28: 20 mL via PERINEURAL

## 2014-06-28 MED ORDER — BUPIVACAINE-EPINEPHRINE 0.5% -1:200000 IJ SOLN
INTRAMUSCULAR | Status: DC | PRN
  Administered 2014-06-28: 10 mL

## 2014-06-28 MED ORDER — FENTANYL CITRATE 0.05 MG/ML IJ SOLN
INTRAMUSCULAR | Status: AC
  Filled 2014-06-28: qty 2

## 2014-06-28 MED ORDER — LACTATED RINGERS IV SOLN
INTRAVENOUS | Status: DC
  Administered 2014-06-28 (×2): via INTRAVENOUS

## 2014-06-28 MED ORDER — METHOCARBAMOL 500 MG PO TABS: 500.0000 mg | ORAL_TABLET | Freq: Four times a day (QID) | ORAL | Status: DC | PRN

## 2014-06-28 MED ORDER — SODIUM CHLORIDE 0.9 % IV SOLN: INTRAVENOUS | Status: DC

## 2014-06-28 MED ORDER — DEXTROSE 5 % IV SOLN: 3.0000 g | INTRAVENOUS | Status: DC

## 2014-06-28 MED ORDER — BUPIVACAINE-EPINEPHRINE (PF) 0.25% -1:200000 IJ SOLN
INTRAMUSCULAR | Status: AC
  Filled 2014-06-28: qty 30

## 2014-06-28 MED ORDER — FENTANYL CITRATE 0.05 MG/ML IJ SOLN
INTRAMUSCULAR | Status: DC | PRN
  Administered 2014-06-28: 50 ug via INTRAVENOUS

## 2014-06-28 MED ORDER — DEXAMETHASONE SODIUM PHOSPHATE 10 MG/ML IJ SOLN
INTRAMUSCULAR | Status: DC | PRN
  Administered 2014-06-28: 5 mg via INTRAVENOUS

## 2014-06-28 MED ORDER — FENTANYL CITRATE 0.05 MG/ML IJ SOLN
50.0000 ug | INTRAMUSCULAR | Status: DC | PRN
  Administered 2014-06-28: 100 ug via INTRAVENOUS

## 2014-06-28 MED ORDER — MEPERIDINE HCL 25 MG/ML IJ SOLN: 6.2500 mg | INTRAMUSCULAR | Status: DC | PRN

## 2014-06-28 MED ORDER — PROMETHAZINE HCL 25 MG/ML IJ SOLN: 6.2500 mg | INTRAMUSCULAR | Status: DC | PRN

## 2014-06-28 MED ORDER — MIDAZOLAM HCL 2 MG/2ML IJ SOLN
INTRAMUSCULAR | Status: AC
  Filled 2014-06-28: qty 2

## 2014-06-28 MED ORDER — OXYCODONE HCL 5 MG/5ML PO SOLN: 5.0000 mg | Freq: Once | ORAL | Status: DC | PRN

## 2014-06-28 MED ORDER — LIDOCAINE HCL (CARDIAC) 20 MG/ML IV SOLN
INTRAVENOUS | Status: DC | PRN
  Administered 2014-06-28: 100 mg via INTRAVENOUS

## 2014-06-28 MED ORDER — ONDANSETRON HCL 4 MG/2ML IJ SOLN
INTRAMUSCULAR | Status: DC | PRN
  Administered 2014-06-28: 4 mg via INTRAVENOUS

## 2014-06-28 MED ORDER — OXYCODONE HCL 5 MG PO TABS: 5.0000 mg | ORAL_TABLET | Freq: Once | ORAL | Status: DC | PRN

## 2014-06-28 MED ORDER — METOCLOPRAMIDE HCL 5 MG/ML IJ SOLN: 5.0000 mg | Freq: Three times a day (TID) | INTRAMUSCULAR | Status: DC | PRN

## 2014-06-28 MED ORDER — DEXTROSE 5 % IV SOLN
3.0000 g | INTRAVENOUS | Status: DC | PRN
  Administered 2014-06-28: 3 g via INTRAVENOUS

## 2014-06-28 MED ORDER — CHLORHEXIDINE GLUCONATE 4 % EX LIQD: 60.0000 mL | Freq: Once | CUTANEOUS | Status: DC

## 2014-06-28 MED ORDER — ONDANSETRON HCL 4 MG/2ML IJ SOLN: 4.0000 mg | Freq: Four times a day (QID) | INTRAMUSCULAR | Status: DC | PRN

## 2014-06-28 MED ORDER — ONDANSETRON HCL 4 MG PO TABS: 4.0000 mg | ORAL_TABLET | Freq: Four times a day (QID) | ORAL | Status: DC | PRN

## 2014-06-28 MED ORDER — KETOROLAC TROMETHAMINE 30 MG/ML IJ SOLN
INTRAMUSCULAR | Status: DC | PRN
  Administered 2014-06-28: 30 mg via INTRAVENOUS

## 2014-06-28 MED ORDER — METHYLPREDNISOLONE ACETATE 80 MG/ML IJ SUSP
INTRAMUSCULAR | Status: AC
  Filled 2014-06-28: qty 1

## 2014-06-28 MED ORDER — FENTANYL CITRATE 0.05 MG/ML IJ SOLN
INTRAMUSCULAR | Status: AC
  Filled 2014-06-28: qty 4

## 2014-06-28 MED ORDER — CEFAZOLIN SODIUM 1-5 GM-% IV SOLN
INTRAVENOUS | Status: AC
  Filled 2014-06-28: qty 150

## 2014-06-28 MED ORDER — PROPOFOL 10 MG/ML IV BOLUS
INTRAVENOUS | Status: DC | PRN
  Administered 2014-06-28: 300 mg via INTRAVENOUS

## 2014-06-28 MED ORDER — METOCLOPRAMIDE HCL 5 MG PO TABS: 5.0000 mg | ORAL_TABLET | Freq: Three times a day (TID) | ORAL | Status: DC | PRN

## 2014-06-28 MED ORDER — SODIUM CHLORIDE 0.9 % IR SOLN
Status: DC | PRN
  Administered 2014-06-28: 3000 mL

## 2014-06-28 MED ORDER — METHYLPREDNISOLONE ACETATE 80 MG/ML IJ SUSP
INTRAMUSCULAR | Status: DC | PRN
  Administered 2014-06-28: 80 mg

## 2014-06-28 MED ORDER — OXYCODONE-ACETAMINOPHEN 5-325 MG PO TABS: 1.0000 | ORAL_TABLET | ORAL | Status: DC | PRN

## 2014-06-28 MED ORDER — DOCUSATE SODIUM 100 MG PO CAPS: 100.0000 mg | ORAL_CAPSULE | Freq: Two times a day (BID) | ORAL | Status: DC

## 2014-06-28 MED ORDER — HYDROMORPHONE HCL 1 MG/ML IJ SOLN: 0.2500 mg | INTRAMUSCULAR | Status: DC | PRN

## 2014-06-28 MED ORDER — HYDROMORPHONE HCL 1 MG/ML IJ SOLN: 0.5000 mg | INTRAMUSCULAR | Status: DC | PRN

## 2014-06-28 MED ORDER — METHOCARBAMOL 1000 MG/10ML IJ SOLN: 500.0000 mg | Freq: Four times a day (QID) | INTRAVENOUS | Status: DC | PRN

## 2014-06-28 MED ORDER — MIDAZOLAM HCL 2 MG/2ML IJ SOLN
1.0000 mg | INTRAMUSCULAR | Status: DC | PRN
  Administered 2014-06-28: 2 mg via INTRAVENOUS

## 2014-06-28 SURGICAL SUPPLY — 41 items
BANDAGE ELASTIC 6 VELCRO ST LF (GAUZE/BANDAGES/DRESSINGS) ×2 IMPLANT
BANDAGE ESMARK 6X9 LF (GAUZE/BANDAGES/DRESSINGS) IMPLANT
BLADE 4.2CUDA (BLADE) ×2 IMPLANT
BLADE CUDA GRT WHITE 3.5 (BLADE) ×2 IMPLANT
BNDG CMPR 9X6 STRL LF SNTH (GAUZE/BANDAGES/DRESSINGS)
BNDG ESMARK 6X9 LF (GAUZE/BANDAGES/DRESSINGS)
BNDG GAUZE ELAST 4 BULKY (GAUZE/BANDAGES/DRESSINGS) ×3 IMPLANT
BRUSH SCRUB EZ PLAIN DRY (MISCELLANEOUS) ×1 IMPLANT
CANISTER SUCT 3000ML (MISCELLANEOUS) IMPLANT
DRAPE ARTHROSCOPY W/POUCH 114 (DRAPES) ×3 IMPLANT
DRSG EMULSION OIL 3X3 NADH (GAUZE/BANDAGES/DRESSINGS) ×3 IMPLANT
DURAPREP 26ML APPLICATOR (WOUND CARE) ×3 IMPLANT
GAUZE SPONGE 4X4 12PLY STRL (GAUZE/BANDAGES/DRESSINGS) ×3 IMPLANT
GLOVE BIO SURGEON STRL SZ7.5 (GLOVE) ×3 IMPLANT
GLOVE BIOGEL PI IND STRL 7.0 (GLOVE) IMPLANT
GLOVE BIOGEL PI IND STRL 8 (GLOVE) ×2 IMPLANT
GLOVE BIOGEL PI INDICATOR 7.0 (GLOVE) ×4
GLOVE BIOGEL PI INDICATOR 8 (GLOVE) ×4
GLOVE ECLIPSE 6.5 STRL STRAW (GLOVE) ×4 IMPLANT
GLOVE EXAM NITRILE MD LF STRL (GLOVE) ×2 IMPLANT
GLOVE SURG ORTHO 8.0 STRL STRW (GLOVE) ×3 IMPLANT
GOWN STRL REUS W/ TWL LRG LVL3 (GOWN DISPOSABLE) ×1 IMPLANT
GOWN STRL REUS W/ TWL XL LVL3 (GOWN DISPOSABLE) ×1 IMPLANT
GOWN STRL REUS W/TWL LRG LVL3 (GOWN DISPOSABLE) ×6
GOWN STRL REUS W/TWL XL LVL3 (GOWN DISPOSABLE) ×3
HOLDER KNEE FOAM BLUE (MISCELLANEOUS) ×1 IMPLANT
KNEE WRAP E Z 3 GEL PACK (MISCELLANEOUS) ×3 IMPLANT
MANIFOLD NEPTUNE II (INSTRUMENTS) ×2 IMPLANT
NDL SAFETY ECLIPSE 18X1.5 (NEEDLE) ×1 IMPLANT
NEEDLE HYPO 18GX1.5 SHARP (NEEDLE)
PACK ARTHROSCOPY DSU (CUSTOM PROCEDURE TRAY) ×3 IMPLANT
PACK BASIN DAY SURGERY FS (CUSTOM PROCEDURE TRAY) ×3 IMPLANT
SET ARTHROSCOPY TUBING (MISCELLANEOUS) ×3
SET ARTHROSCOPY TUBING LN (MISCELLANEOUS) ×1 IMPLANT
SUT ETHILON 4 0 PS 2 18 (SUTURE) ×3 IMPLANT
SYR 5ML LL (SYRINGE) ×1 IMPLANT
TOWEL OR 17X24 6PK STRL BLUE (TOWEL DISPOSABLE) ×3 IMPLANT
WAND 3.0 CAPSURE 30 DEG W/CORD (SURGICAL WAND) IMPLANT
WAND 30 DEG SABER W/CORD (SURGICAL WAND) IMPLANT
WAND STAR VAC 90 (SURGICAL WAND) IMPLANT
WATER STERILE IRR 1000ML POUR (IV SOLUTION) ×3 IMPLANT

## 2014-06-28 NOTE — Anesthesia Preprocedure Evaluation (Signed)
Anesthesia Evaluation  Patient identified by MRN, date of birth, ID band Patient awake    Reviewed: Allergy & Precautions, NPO status , Patient's Chart, lab work & pertinent test results  Airway Mallampati: II  TM Distance: >3 FB Neck ROM: Full    Dental no notable dental hx.    Pulmonary sleep apnea , COPDCurrent Smoker,  breath sounds clear to auscultation  Pulmonary exam normal       Cardiovascular hypertension, Pt. on medications Rhythm:Regular Rate:Normal     Neuro/Psych negative neurological ROS  negative psych ROS   GI/Hepatic Neg liver ROS, GERD-  ,  Endo/Other  negative endocrine ROSdiabetes  Renal/GU negative Renal ROS     Musculoskeletal negative musculoskeletal ROS (+)   Abdominal   Peds  Hematology negative hematology ROS (+)   Anesthesia Other Findings   Reproductive/Obstetrics negative OB ROS                             Anesthesia Physical Anesthesia Plan  ASA: III  Anesthesia Plan: General and Regional   Post-op Pain Management:    Induction: Intravenous  Airway Management Planned: LMA  Additional Equipment: None  Intra-op Plan:   Post-operative Plan: Extubation in OR  Informed Consent: I have reviewed the patients History and Physical, chart, labs and discussed the procedure including the risks, benefits and alternatives for the proposed anesthesia with the patient or authorized representative who has indicated his/her understanding and acceptance.   Dental advisory given  Plan Discussed with: CRNA  Anesthesia Plan Comments:         Anesthesia Quick Evaluation

## 2014-06-28 NOTE — Interval H&P Note (Signed)
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 After consideration of risks, benefits and other options for treatment, the patient has consented to  Procedure(s): LEFT KNEE ARTHROSCOPY CHONDROPLASTY/WITH MEDIAL/LATERAL MENISECTOMIES (Left) as a surgical intervention .  The patient's history has been reviewed, patient examined, no change in status, stable for surgery.  I have reviewed the patient's chart and labs.  Questions were answered to the patient's satisfaction.      JR,W D

## 2014-06-28 NOTE — Progress Notes (Signed)
AssistedDr. Germeroth with left, ultrasound guided, adductor canal block. Side rails up, monitors on throughout procedure. See vital signs in flow sheet. Tolerated Procedure well.  

## 2014-06-28 NOTE — Brief Op Note (Signed)
06/28/2014  1:09 PM  PATIENT:  Raymond [name]  48 y.o. 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 CURRENT INJURY LEFT KNEE/OTHER TEAR OF LATERAL MENISCUS CURRENT INJURY LEFT KNEE  POST-OPERATIVE DIAGNOSIS:  Right Medial and Lateral Meniscus Tear,  PROCEDURE:  Procedure(s): LEFT KNEE ARTHROSCOPY CHONDROPLASTY/WITH MEDIAL/LATERAL MENISECTOMIES (Left) KNEE ARTHROSCOPY WITH LATERAL MENISECTOMY (Left) CHONDROPLASTY (Left)  SURGEON:  Surgeon(s) and Role:    * W D Carloyn Manneraffrey Jr., MD - Primary  PHYSICIAN ASSISTANT: Margart SicklesJoshua , PA-C  ASSISTANTS:   ANESTHESIA:   regional and IV sedation  EBL:  Total I/O In: 800 [I.V.:800] Out: -   BLOOD ADMINISTERED:none  DRAINS: none   LOCAL MEDICATIONS USED:  MARCAINE     SPECIMEN:  No Specimen  DISPOSITION OF SPECIMEN:  N/A  COUNTS:  YES  TOURNIQUET:  * No tourniquets in log *  DICTATION: .Other Dictation: Dictation Number unknown  PLAN OF CARE: per anesthesia rec's if stable [place] d/c home from ortho standpoint  PATIENT DISPOSITION:  PACU - hemodynamically stable.   Delay start of Pharmacological VTE agent (>24hrs) due to surgical blood loss or risk of bleeding: not applicable

## 2014-06-28 NOTE — Discharge Instructions (Signed)
Diet: As you were doing prior to hospitalization   Activity: Increase activity slowly as tolerated  No lifting or driving today  Shower: Oxley shower without a dressing on post op day #2, NO SOAKING in tub   Dressing: You Okun change your dressing on post op day #2.  Then change the dressing daily with sterile 4"x4"s gauze dressing or bandaids  Weight Bearing: weight bearing as tolerated  To prevent constipation: you [place]stone use a stool softener such as -  Colace ( over the counter) 100 mg by mouth twice a day  Drink plenty of fluids ( prune juice Farquhar be helpful) and high fiber foods  Miralax ( over the counter) for constipation as needed.   Precautions: If you experience chest pain or shortness of breath - call 911 immediately For transfer to the hospital emergency department!!  If you develop a fever greater that 101 F, purulent drainage from wound, increased redness or drainage from wound, or calf pain -- Call the office   Follow- Up Appointment: Please call for an appointment to be seen in 1 week  [place] - [phone]  Post Anesthesia Home Care Instructions  Activity: Get plenty of rest for the remainder of the day. A responsible adult should stay with you for 24 hours following the procedure.  For the next 24 hours, DO NOT: -Drive a car -Advertising copywriterperate machinery -Drink alcoholic beverages -Take any medication unless instructed by your physician -Make any legal decisions or sign important papers.  Meals: Start with liquid foods such as gelatin or soup. Progress to regular foods as tolerated. Avoid greasy, spicy, heavy foods. If nausea and/or vomiting occur, drink only clear liquids until the nausea and/or vomiting subsides. Call your physician if vomiting continues.  Special Instructions/Symptoms: Your throat Dimalanta feel dry or sore from the anesthesia or the breathing tube placed in your throat during surgery. If this causes discomfort, gargle with warm salt water. The discomfort  should disappear within 24 hours.   Regional Anesthesia Blocks  1. Numbness or the inability to move the "blocked" extremity [place] last from 3-48 hours after placement. The length of time depends on the medication injected and your individual response to the medication. If the numbness is not going away after 48 hours, call your surgeon.  2. The extremity that is blocked will need to be protected until the numbness is gone and the  Strength has returned. Because you cannot feel it, you will need to take extra care to avoid injury. Because it Senseney be weak, you [place] have difficulty moving it or using it. You Funez not know what position it is in without looking at it while the block is in effect.  3. For blocks in the legs and feet, returning to weight bearing and walking needs to be done carefully. You will need to wait until the numbness is entirely gone and the strength has returned. You should be able to move your leg and foot normally before you try and bear weight or walk. You will need someone to be with you when you first try to ensure you do not fall and possibly risk injury.  4. Bruising and tenderness at the needle site are common side effects and will resolve in a few days.  5. Persistent numbness or new problems with movement should be communicated to the surgeon or the [facility] [phone])/ [facility] [phone]).

## 2014-06-28 NOTE — Anesthesia Procedure Notes (Addendum)
Anesthesia Regional Block:  Adductor canal block  Pre-Anesthetic Checklist: ,, timeout performed, Correct Patient, Correct Site, Correct Laterality, Correct Procedure, Correct Position, site marked, Risks and benefits discussed,  Surgical consent,  Pre-op evaluation,  At surgeon's request and post-op pain management  Laterality: Left  Prep: chloraprep       Needles:  Injection technique: Single-shot  Needle Type: Stimulator Needle - 80     Needle Length: 9cm 9 cm Needle Gauge: 22 and 22 G  Needle insertion depth: 6 cm   Additional Needles:  Procedures: ultrasound guided (picture in chart) Adductor canal block  Nerve Stimulator or Paresthesia:  Response: Twitch elicited, 0.8 mA,   Additional Responses:   Narrative:   Events: blood aspirated  Performed by: Personally   Additional Notes: Target deep due to pt habitus. One positive aspiration. Redirected needle. Negative aspiration. Good perineural/subfascial spread. Pt tolerated well.    Procedure Name: LMA Insertion Date/Time: 06/28/2014 12:23 PM Performed by: Burna CashONRAD,  C Pre-anesthesia Checklist: Patient identified, Emergency Drugs available, Suction available and Patient being monitored Patient Re-evaluated:Patient Re-evaluated prior to inductionOxygen Delivery Method: Circle System Utilized Preoxygenation: Pre-oxygenation with 100% oxygen Intubation Type: IV induction Ventilation: Mask ventilation without difficulty LMA: LMA inserted LMA Size: 5.0 Number of attempts: 1 Airway Equipment and Method: Bite block Placement Confirmation: positive ETCO2 Tube secured with: Tape Dental Injury: Teeth and Oropharynx as per pre-operative assessment

## 2014-06-28 NOTE — Anesthesia Postprocedure Evaluation (Signed)
Anesthesia Post Note  Patient: Raymond [name]  Procedure(s) Performed: Procedure(s) (LRB): LEFT KNEE ARTHROSCOPY CHONDROPLASTY/WITH MEDIAL/LATERAL MENISECTOMIES (Left) KNEE ARTHROSCOPY WITH LATERAL MENISECTOMY (Left) CHONDROPLASTY (Left)  Anesthesia type: General  Patient location: PACU  Post pain: Pain level controlled  Post assessment: Post-op Vital signs reviewed  Last Vitals: BP 159/94 mmHg  Pulse 76  Temp(Src) 36.7 C (Oral)  Resp 16  Ht 5\' 11"  (1.803 m)  Wt 371 lb 4 oz (168.398 kg)  BMI 51.80 kg/m2  SpO2 96%  Post vital signs: Reviewed  Level of consciousness: sedated  Assessment: Good analgesia from adductor canal block. 5/5 strength in L quad. Ambulating well.   Complications: No apparent anesthesia complications

## 2014-06-28 NOTE — H&P (View-Only) (Signed)
Dineen KidDavid C Radabaugh is an 49 y.o. male.   Chief Complaint: left knee pain  HPI: Patient is a 49 year-old male who we have seen previously with left knee pain.  He did have some medial compartment narrowing and we had elected to treat with Corticosteroid injection.  He states he received good relief, but it only lasted approximately one month.  Also now with right knee complaints.  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  Patient is at his wits end.  He describes occasional locking sensation with the left knee.  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  Past Medical History  Diagnosis Date  . Hypertension   . Gout   . Diabetes mellitus without complication   . Sleep apnea     uses a cpap  . Heavy smoker   . COPD (chronic obstructive pulmonary disease)   . GERD (gastroesophageal reflux disease)   . Anxiety     Past Surgical History  Procedure Laterality Date  . Orif radius & ulna fractures  2007    left  . Colonoscopy    . Foot fasciotomy      age 49-rt    Family History  Problem Relation Age of Onset  . Diabetes Mother   . Hypertension Mother   . Diabetes Father   . Hypertension Father   . Cancer Other    Social History:  reports that he has been smoking.  He does not have any smokeless tobacco history on file. He reports that he drinks alcohol. He reports that he does not use illicit drugs.  Allergies: No Known Allergies   (Not in a hospital admission)  No results found for this or any previous visit (from the past 48 hour(s)). No results found.  Review of Systems  Constitutional: Negative.   HENT: Positive for tinnitus. Negative for congestion, ear discharge, ear pain, hearing loss, nosebleeds and sore throat.   Eyes: Negative.   Respiratory: Negative.  Negative for stridor.    Cardiovascular: Negative.   Gastrointestinal: Negative.   Genitourinary: Negative.   Musculoskeletal: Positive for joint pain.  Skin: Negative.   Neurological: Negative.  Negative for headaches.  Endo/Heme/Allergies: Negative.   Psychiatric/Behavioral: Negative.     There were no vitals taken for this visit. Physical Exam  Constitutional: He is oriented to person, place, and time. He appears well-developed and well-nourished. No distress.  HENT:  Head: Normocephalic and atraumatic.  Nose: Nose normal.  Eyes: Conjunctivae and EOM are normal. Pupils are equal, round, and reactive to light.  Neck: Normal range of motion. Neck supple.  Cardiovascular: Normal rate and intact distal pulses.   Respiratory: Effort normal. No respiratory distress.  GI: Soft. He exhibits no distension. There is no tenderness.  obese  Musculoskeletal:       Left knee: He exhibits decreased range of motion and swelling. He exhibits no erythema, no LCL laxity and no MCL laxity. Tenderness found. Medial joint line and lateral joint line tenderness noted.  Lymphadenopathy:    He has no cervical adenopathy.  Neurological: He is alert and oriented to person, place, and time. No cranial nerve deficit.  Skin: Skin is warm and dry. No rash noted. No erythema.  Psychiatric: He has a normal mood and affect. His behavior is normal.  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  Will set up as soon as possible.  Margart Sickles 06/27/2014, 3:01 PM

## 2014-06-28 NOTE — Transfer of Care (Signed)
Immediate Anesthesia Transfer of Care Note  Patient: Raymond [name]  Procedure(s) Performed: Procedure(s): LEFT KNEE ARTHROSCOPY CHONDROPLASTY/WITH MEDIAL/LATERAL MENISECTOMIES (Left) KNEE ARTHROSCOPY WITH LATERAL MENISECTOMY (Left) CHONDROPLASTY (Left)  Patient Location: PACU  Anesthesia Type:GA combined with regional for post-op pain  Level of Consciousness: awake, alert  and oriented  Airway & Oxygen Therapy: Patient Spontanous Breathing and Patient connected to face mask oxygen  Post-op Assessment: Report given to PACU RN and Post -op Vital signs reviewed and stable  Post vital signs: Reviewed and stable  Complications: No apparent anesthesia complications

## 2014-06-29 ENCOUNTER — Encounter (HOSPITAL_BASED_OUTPATIENT_CLINIC_OR_DEPARTMENT_OTHER): Payer: Self-pay | Admitting: Orthopedic Surgery

## 2014-06-29 NOTE — Op Note (Signed)
NAME:  Floria RavelingMAY, Ashur                   ACCOUNT NO.:  [id number]  MEDICAL RECORD NO.:  [id number]  LOCATION:                               FACILITY:  [facility]  PHYSICIAN:  Dyke BrackettW. D. , M.D.    DATE OF BIRTH:  03/19/1966  DATE OF PROCEDURE:  06/28/2014 DATE OF DISCHARGE:  06/28/2014                              OPERATIVE REPORT   INDICATIONS:  A 49 year old, MRI proven left knee symptomatic medial meniscus tear, lateral meniscus tear, OA of the knee, thought to be amenable to outpatient surgery.  PREOPERATIVE DIAGNOSES: 1. Torn medial lateral meniscus, left knee. 2. Osteoarthritis, tricompartmental.  POSTOPERATIVE DIAGNOSES: 1. Torn medial lateral meniscus, left knee. 2. Osteoarthritis, tricompartmental.  OPERATION: 1. Partial medial and lateral meniscectomies. 2. Debridement chondroplasty of the patellofemoral joint and medial     lateral compartment.  SURGEON:  Dyke BrackettW. D. , M.D.  ASSISTANT:  Margart SicklesJoshua Chadwell, PA-C.  DESCRIPTION OF PROCEDURE:  The patient's high BMI only necessitated the surgical assistance.  He had an adductor block, general anesthetic with local supplementation, inferomedial, inferolateral portals created, manipulation of the knee to obtain access.  In the patellofemoral area, mild degenerative changes of synovitis noted.  We debrided it.  Lateral compartment showed minimal change with a small radial tear of the lateral meniscus, currently a small amount of meniscus we resected.  The complex tear of the medial meniscus was encountered with resection of the posterior horn. Additionally, chondral irregularity particularly on the tibia; weightbearing axis was somewhat deviated towards the medial side of the knee leaving the chondral irregularity probably at 2 or 3 cm grade 3 lesion of the condyle with relatively sparing of the tibia.  This was aggressively debrided but no grade 4 changes were appreciated.  Again, extensive medial meniscectomy carried  out. Knee drained free of fluid.  Portals closed with nylon, infiltrated with 4 to 5 mL of Marcaine with 80 mg Depo-Medrol.  Taken to the recovery room in a stable condition.     Dyke BrackettW. D. , M.D.     WDC/MEDQ  D:  06/28/2014  T:  06/29/2014  Job:  [id number]

## 2014-07-19 ENCOUNTER — Encounter (HOSPITAL_COMMUNITY): Payer: Self-pay | Admitting: *Deleted

## 2014-07-19 ENCOUNTER — Emergency Department (HOSPITAL_COMMUNITY)
Admission: EM | Admit: 2014-07-19 | Discharge: 2014-07-20 | Disposition: A | Discharge status: Home / Self Care | Payer: 59 | Attending: Emergency Medicine | Admitting: Emergency Medicine

## 2014-07-19 ENCOUNTER — Emergency Department (HOSPITAL_COMMUNITY): Payer: 59

## 2014-07-19 DIAGNOSIS — E669 Obesity, unspecified: Secondary | ICD-10-CM | POA: Diagnosis not present

## 2014-07-19 DIAGNOSIS — M109 Gout, unspecified: Secondary | ICD-10-CM | POA: Insufficient documentation

## 2014-07-19 DIAGNOSIS — J449 Chronic obstructive pulmonary disease, unspecified: Secondary | ICD-10-CM | POA: Insufficient documentation

## 2014-07-19 DIAGNOSIS — Z720 Tobacco use: Secondary | ICD-10-CM | POA: Insufficient documentation

## 2014-07-19 DIAGNOSIS — I10 Essential (primary) hypertension: Secondary | ICD-10-CM | POA: Insufficient documentation

## 2014-07-19 DIAGNOSIS — L03115 Cellulitis of right lower limb: Secondary | ICD-10-CM

## 2014-07-19 DIAGNOSIS — Z7982 Long term (current) use of aspirin: Secondary | ICD-10-CM | POA: Diagnosis not present

## 2014-07-19 DIAGNOSIS — Z79899 Other long term (current) drug therapy: Secondary | ICD-10-CM | POA: Diagnosis not present

## 2014-07-19 DIAGNOSIS — F419 Anxiety disorder, unspecified: Secondary | ICD-10-CM | POA: Diagnosis not present

## 2014-07-19 DIAGNOSIS — K219 Gastro-esophageal reflux disease without esophagitis: Secondary | ICD-10-CM | POA: Diagnosis not present

## 2014-07-19 DIAGNOSIS — E119 Type 2 diabetes mellitus without complications: Secondary | ICD-10-CM | POA: Diagnosis not present

## 2014-07-19 DIAGNOSIS — G4730 Sleep apnea, unspecified: Secondary | ICD-10-CM | POA: Diagnosis not present

## 2014-07-19 DIAGNOSIS — Z9981 Dependence on supplemental oxygen: Secondary | ICD-10-CM | POA: Insufficient documentation

## 2014-07-19 LAB — COMPREHENSIVE METABOLIC PANEL
ALT: 32 U/L (ref 0–53)
AST: 27 U/L (ref 0–37)
Albumin: 3.7 g/dL (ref 3.5–5.2)
Alkaline Phosphatase: 100 U/L (ref 39–117)
Anion gap: 6 (ref 5–15)
BUN: 6 mg/dL (ref 6–23)
CO2: 31 mmol/L (ref 19–32)
CREATININE: 0.87 mg/dL (ref 0.50–1.35)
Calcium: 8.8 mg/dL (ref 8.4–10.5)
Chloride: 99 mmol/L (ref 96–112)
GFR calc Af Amer: >90 mL/min (ref >90)
Glucose, Bld: 151 mg/dL — ABNORMAL HIGH (ref 70–99)
POTASSIUM: 3.4 mmol/L — AB (ref 3.5–5.1)
Sodium: 136 mmol/L (ref 135–145)
TOTAL PROTEIN: 7.6 g/dL (ref 6.0–8.3)
Total Bilirubin: 0.4 mg/dL (ref 0.3–1.2)

## 2014-07-19 LAB — CBC WITH DIFFERENTIAL/PLATELET
Basophils Absolute: 0 10*3/uL (ref 0.0–0.1)
Basophils Relative: 1 % (ref 0–1)
Eosinophils Absolute: 0.5 10*3/uL (ref 0.0–0.7)
Eosinophils Relative: 5 % (ref 0–5)
HEMATOCRIT: 42.9 % (ref 39.0–52.0)
HEMOGLOBIN: 14.3 g/dL (ref 13.0–17.0)
LYMPHS ABS: 1.7 10*3/uL (ref 0.7–4.0)
Lymphocytes Relative: 19 % (ref 12–46)
MCH: 31 pg (ref 26.0–34.0)
MCHC: 33.3 g/dL (ref 30.0–36.0)
MCV: 92.9 fL (ref 78.0–100.0)
MONOS PCT: 8 % (ref 3–12)
Monocytes Absolute: 0.7 10*3/uL (ref 0.1–1.0)
NEUTROS PCT: 67 % (ref 43–77)
Neutro Abs: 5.9 10*3/uL (ref 1.7–7.7)
Platelets: 260 10*3/uL (ref 150–400)
RBC: 4.62 MIL/uL (ref 4.22–5.81)
RDW: 15.3 % (ref 11.5–15.5)
WBC: 8.9 10*3/uL (ref 4.0–10.5)

## 2014-07-19 MED ORDER — LORAZEPAM 1 MG PO TABS
1.0000 mg | ORAL_TABLET | Freq: Once | ORAL | Status: AC
  Administered 2014-07-19: 1 mg via ORAL
  Filled 2014-07-19: qty 1

## 2014-07-19 MED ORDER — VANCOMYCIN HCL 10 G IV SOLR
2000.0000 mg | Freq: Once | INTRAVENOUS | Status: AC
  Administered 2014-07-19: 2000 mg via INTRAVENOUS
  Filled 2014-07-19: qty 2000

## 2014-07-19 MED ORDER — VANCOMYCIN HCL 10 G IV SOLR: 1500.0000 mg | Freq: Two times a day (BID) | INTRAVENOUS | Status: DC

## 2014-07-19 MED ORDER — SULFAMETHOXAZOLE-TRIMETHOPRIM 800-160 MG PO TABS: 1.0000 | ORAL_TABLET | Freq: Two times a day (BID) | ORAL | Status: DC

## 2014-07-19 NOTE — ED Provider Notes (Signed)
CSN: [id number]     Arrival date & time 07/19/14  9562 History   First MD Initiated Contact with Patient 07/19/14 2138     Chief Complaint  Patient presents with  . Cellulitis     (Consider location/radiation/quality/duration/timing/severity/associated sxs/prior Treatment) HPI.... Patient complains of erythema in his right lower extremity for the past 2 weeks. He was seen by a his orthopedic surgeon 2 days ago and placed on Keflex. No fever, sweats, chills. Patient is obese and diabetic. No extension of rash proximally. Severity is moderate. Nothing makes symptoms better or worse.  Past Medical History  Diagnosis Date  . Hypertension   . Gout   . Diabetes mellitus without complication   . Sleep apnea     uses a cpap  . Heavy smoker   . COPD (chronic obstructive pulmonary disease)   . GERD (gastroesophageal reflux disease)   . Anxiety    Past Surgical History  Procedure Laterality Date  . Orif radius & ulna fractures  2007    left  . Colonoscopy    . Foot fasciotomy      age 49-rt  . Knee arthroscopy with medial menisectomy Left 06/28/2014    Procedure: LEFT KNEE ARTHROSCOPY CHONDROPLASTY/WITH MEDIAL/LATERAL MENISECTOMIES;  Surgeon: Thera Flake., MD;  Location: [facility];  Service: Orthopedics;  Laterality: Left;  . Knee arthroscopy with lateral menisectomy Left 06/28/2014    Procedure: KNEE ARTHROSCOPY WITH LATERAL MENISECTOMY;  Surgeon: Thera Flake., MD;  Location: [facility];  Service: Orthopedics;  Laterality: Left;  . Chondroplasty Left 06/28/2014    Procedure: CHONDROPLASTY;  Surgeon: Thera Flake., MD;  Location: [facility];  Service: Orthopedics;  Laterality: Left;   Family History  Problem Relation Age of Onset  . Diabetes Mother   . Hypertension Mother   . Diabetes Father   . Hypertension Father   . Cancer Other    History  Substance Use Topics  . Smoking status: Current Every Day Smoker -- 2.00 packs/day  .  Smokeless tobacco: Not on file  . Alcohol Use: Yes     Comment: occ    Review of Systems  All other systems reviewed and are negative.     Allergies  Review of patient's allergies indicates no known allergies.  Home Medications   Prior to Admission medications   Medication Sig Start Date End Date Taking? Authorizing Provider  albuterol (PROVENTIL HFA;VENTOLIN HFA) 108 (90 BASE) MCG/ACT inhaler Inhale 1-2 puffs into the lungs every 6 (six) hours as needed for wheezing or shortness of breath. 06/02/14   Reuben Likes, MD  allopurinol (ZYLOPRIM) 300 MG tablet Take 300 mg by mouth daily.    Historical Provider, MD  ALPRAZolam Prudy Feeler) 0.5 MG tablet Take 0.5 mg by mouth at bedtime as needed for anxiety.    Historical Provider, MD  amLODipine-atorvastatin (CADUET) 5-20 MG per tablet Take 1 tablet by mouth 2 (two) times daily.    Historical Provider, MD  aspirin 81 MG tablet Take 81 mg by mouth daily.    Historical Provider, MD  atorvastatin (LIPITOR) 10 MG tablet Take 10 mg by mouth at bedtime.     Historical Provider, MD  carvedilol (COREG) 25 MG tablet Take 25 mg by mouth 2 (two) times daily with a meal.    Historical Provider, MD  furosemide (LASIX) 80 MG tablet Take 80 mg by mouth 2 (two) times daily.    Historical Provider, MD  gabapentin (NEURONTIN)  600 MG tablet Take 600 mg by mouth at bedtime.    Historical Provider, MD  glipiZIDE (GLUCOTROL) 5 MG tablet Take 5 mg by mouth 2 (two) times daily.    Historical Provider, MD  losartan (COZAAR) 50 MG tablet Take 50 mg by mouth daily.    Historical Provider, MD  metFORMIN (GLUCOPHAGE) 500 MG tablet Take 1,000 mg by mouth every morning.     Historical Provider, MD  omeprazole (PRILOSEC) 40 MG capsule Take 40 mg by mouth daily.    Historical Provider, MD  oxyCODONE-acetaminophen (PERCOCET) 5-325 MG per tablet Take 2 tablets by mouth every 4 (four) hours as needed. 04/16/14   Courtney A Forcucci, PA-C  potassium chloride SA (K-DUR,KLOR-CON) 20  MEQ tablet Take 20 mEq by mouth 2 (two) times daily.    Historical Provider, MD  sulfamethoxazole-trimethoprim (SEPTRA DS) 800-160 MG per tablet Take 1 tablet by mouth 2 (two) times daily. 07/19/14   Donnetta HutchingBrian , MD   BP 116/47 mmHg  Pulse 75  Temp(Src) 98.1 F (36.7 C) (Oral)  Resp 18  SpO2 96% Physical Exam  Constitutional: He is oriented to person, place, and time. He appears well-developed and well-nourished.  Obese, no acute distress  HENT:  Head: Normocephalic and atraumatic.  Eyes: Conjunctivae and EOM are normal. Pupils are equal, round, and reactive to light.  Neck: Normal range of motion. Neck supple.  Cardiovascular: Normal rate and regular rhythm.   Pulmonary/Chest: Effort normal and breath sounds normal.  Abdominal: Soft. Bowel sounds are normal.  Musculoskeletal: Normal range of motion.  Neurological: He is alert and oriented to person, place, and time.  Skin:  Right lower extremity: Area of erythema in the middle third circumferentially of the tibia region  Psychiatric: He has a normal mood and affect. His behavior is normal.  Nursing note and vitals reviewed.   ED Course  Procedures (including critical care time) Labs Review Labs Reviewed  COMPREHENSIVE METABOLIC PANEL - Abnormal; Notable for the following:    Potassium 3.4 (*)    Glucose, Bld 151 (*)    All other components within normal limits  CBC WITH DIFFERENTIAL/PLATELET    Imaging Review Dg Tibia/fibula Right  07/19/2014   CLINICAL DATA:  Right lower leg pain for the past 3 weeks. No known injury.  EXAM: RIGHT TIBIA AND FIBULA - 2 VIEW  COMPARISON:  None.  FINDINGS: Diffuse subcutaneous edema. Moderate medial knee joint space narrowing and associated spur formation. Otherwise, unremarkable bones.  IMPRESSION: 1. Diffuse subcutaneous edema. 2. Medial knee degenerative changes.   Electronically Signed   By: Beckie SaltsSteven  Reid M.D.   On: 07/19/2014 19:39     EKG Interpretation None      MDM   Final  diagnoses:  Cellulitis of right lower extremity    Patient is nontoxic-appearing. White count normal. Glucose within reasonable control. IV vancomycin given in the emergency department. Will add Septra DS twice a day to his antibiotic regimen. Discussed with patient and his wife. He will return if worse.    Donnetta HutchingBrian , MD 07/19/14 [phone]

## 2014-07-19 NOTE — Discharge Instructions (Signed)
Start new antibiotic tomorrow morning. Continue with your other antibiotic. Can shower with your leg. Elevate. Do not put compression hose on. Return if worse.

## 2014-07-19 NOTE — ED Notes (Signed)
Pt states he has had a red place on his rt leg since feb 1st after dropping a log on his leg, was placed on medications 3 days ago for cellulitis but states pain has gotten worse and area has grown. Pt denies fever at home, alert, oriented, nad

## 2014-07-19 NOTE — ED Notes (Signed)
The pt ic c/o  Redness and swelling to his rt lower leg for 3 weeks.  He was diagnosed with cellulitis of that leg on Monday.  Huis keg pain has increased sine then and he has difficulty walking

## 2014-07-19 NOTE — Progress Notes (Signed)
ANTIBIOTIC CONSULT NOTE - INITIAL  Pharmacy Consult for vancomycin Indication: cellulitis  No Known Allergies  Patient Measurements:   Adjusted Body Weight:   Vital Signs: Temp: 98.1 F (36.7 C) (02/17 2049) Temp Source: Oral (02/17 2049) BP: 153/78 mmHg (02/17 2049) Pulse Rate: 73 (02/17 2049) Intake/Output from previous day:   Intake/Output from this shift:    Labs:  Recent Labs  07/19/14 1846  WBC 8.9  HGB 14.3  PLT 260  CREATININE 0.87   CrCl cannot be calculated (Unknown ideal weight.). No results for input(s): VANCOTROUGH, VANCOPEAK, VANCORANDOM, GENTTROUGH, GENTPEAK, GENTRANDOM, TOBRATROUGH, TOBRAPEAK, TOBRARND, AMIKACINPEAK, AMIKACINTROU, AMIKACIN in the last 72 hours.   Microbiology: No results found for this or any previous visit (from the past 720 hour(s)).  Medical History: Past Medical History  Diagnosis Date  . Hypertension   . Gout   . Diabetes mellitus without complication   . Sleep apnea     uses a cpap  . Heavy smoker   . COPD (chronic obstructive pulmonary disease)   . GERD (gastroesophageal reflux disease)   . Anxiety     Medications:  Anti-infectives    Start     Dose/Rate Route Frequency Ordered Stop   07/20/14 1100  vancomycin (VANCOCIN) 1,500 mg in sodium chloride 0.9 % 500 mL IVPB     1,500 mg 250 mL/hr over 120 Minutes Intravenous Every 12 hours 07/19/14 2204     07/19/14 2215  vancomycin (VANCOCIN) 2,000 mg in sodium chloride 0.9 % 500 mL IVPB     2,000 mg 250 mL/hr over 120 Minutes Intravenous  Once 07/19/14 2204       Assessment: 48 yom presented to the ED with redness and swelling of RLE x 3 weeks. To start IV vancomycin for cellulitis. Pt is afebrile, WBC is WNL and SCr is WNL.   Vanc 2/17>>  Goal of Therapy:  Vancomycin trough level 10-15 mcg/ml  Plan:  1. Vancomycin 2gm IV x 1 then 1500mg  IV Q12H 2. F/u renal fxn, C&S, clinical status and trough at [facility]  , Drake Leachachel Lynn 07/19/2014,10:05 PM

## 2014-08-25 ENCOUNTER — Ambulatory Visit: Payer: 59 | Admitting: Cardiology

## 2014-10-05 ENCOUNTER — Encounter: Payer: Self-pay | Admitting: Cardiology

## 2014-10-05 ENCOUNTER — Ambulatory Visit (INDEPENDENT_AMBULATORY_CARE_PROVIDER_SITE_OTHER): Payer: 59 | Admitting: Cardiology

## 2014-10-05 ENCOUNTER — Telehealth: Payer: Self-pay | Admitting: Cardiology

## 2014-10-05 VITALS — BP 147/83 | HR 82 | Ht 71.0 in | Wt 377.4 lb

## 2014-10-05 DIAGNOSIS — R079 Chest pain, unspecified: Secondary | ICD-10-CM

## 2014-10-05 DIAGNOSIS — R0602 Shortness of breath: Secondary | ICD-10-CM | POA: Diagnosis not present

## 2014-10-05 DIAGNOSIS — R600 Localized edema: Secondary | ICD-10-CM

## 2014-10-05 DIAGNOSIS — R609 Edema, unspecified: Secondary | ICD-10-CM | POA: Diagnosis not present

## 2014-10-05 DIAGNOSIS — I10 Essential (primary) hypertension: Secondary | ICD-10-CM | POA: Diagnosis not present

## 2014-10-05 LAB — CBC WITH DIFFERENTIAL/PLATELET
Basophils Absolute: 0 10*3/uL (ref 0.0–0.1)
Basophils Relative: 0.3 % (ref 0.0–3.0)
EOS PCT: 2.6 % (ref 0.0–5.0)
Eosinophils Absolute: 0.4 10*3/uL (ref 0.0–0.7)
HEMATOCRIT: 42.2 % (ref 39.0–52.0)
Hemoglobin: 14.3 g/dL (ref 13.0–17.0)
LYMPHS ABS: 2 10*3/uL (ref 0.7–4.0)
Lymphocytes Relative: 14.1 % (ref 12.0–46.0)
MCHC: 34 g/dL (ref 30.0–36.0)
MCV: 89 fl (ref 78.0–100.0)
MONO ABS: 0.4 10*3/uL (ref 0.1–1.0)
Monocytes Relative: 2.7 % — ABNORMAL LOW (ref 3.0–12.0)
Neutro Abs: 11.1 10*3/uL — ABNORMAL HIGH (ref 1.4–7.7)
Neutrophils Relative %: 80.3 % — ABNORMAL HIGH (ref 43.0–77.0)
Platelets: 311 10*3/uL (ref 150.0–400.0)
RBC: 4.74 Mil/uL (ref 4.22–5.81)
RDW: 15.9 % — ABNORMAL HIGH (ref 11.5–15.5)
WBC: 13.9 10*3/uL — ABNORMAL HIGH (ref 4.0–10.5)

## 2014-10-05 LAB — BASIC METABOLIC PANEL
BUN: 12 mg/dL (ref 6–23)
CO2: 30 mEq/L (ref 19–32)
CREATININE: 0.92 mg/dL (ref 0.40–1.50)
Calcium: 9.2 mg/dL (ref 8.4–10.5)
Chloride: 98 mEq/L (ref 96–112)
GFR: 92.19 mL/min (ref >60.00)
Glucose, Bld: 158 mg/dL — ABNORMAL HIGH (ref 70–99)
POTASSIUM: 4.4 meq/L (ref 3.5–5.1)
Sodium: 138 mEq/L (ref 135–145)

## 2014-10-05 LAB — TSH: TSH: 1.41 u[IU]/mL (ref 0.35–4.50)

## 2014-10-05 LAB — D-DIMER, QUANTITATIVE: D-Dimer, Quant: 1.48 ug/mL-FEU — ABNORMAL HIGH (ref 0.00–0.48)

## 2014-10-05 NOTE — Progress Notes (Signed)
Cardiology Office Note   Date:  10/05/2014   ID:  Raymond [name], DOB 03/09/1966, MRN [id number]  PCP:  Raymond [name],ANTHONY, Raymond [name]    Chief Complaint  Patient presents with  . New Evaluation    hypertension      History of Present Illness: Raymond [name] is a 49 y.o. male who presents for evaluation of LE edema and SOB.  The patient is morbidly obese with HTN, bout, DM and OSA on CPAP.  He has a long history of alcohol and tobacco use.  He drinks up to a case of beer in a day on the weekends.  He has smoked 3ppd of cigarettes since he was 13.  He says that for years he has had LE edema but since January has gotten worse.  He has LE venous dopplers by his PCP in February that were normal.  He has had SOB for 2-3 years with severity fluctuating depending on his weight.  It improved when his weight was down.  He has had problems with BP control as well.  His wife says his BP is better when he drinks ETOH.  He has been having some chest discomfort across his chest like a rubber band for about a year that is nonexertional and usually occurs when sitting.  He was placed on Lasix 80mg  2 tablets BID by his PCP which has helped.      Past Medical History  Diagnosis Date  . Hypertension   . Gout   . Diabetes mellitus without complication   . Sleep apnea     uses a cpap  . Heavy smoker   . COPD (chronic obstructive pulmonary disease)   . GERD (gastroesophageal reflux disease)   . Anxiety   . Edema   . Hyperlipidemia     Past Surgical History  Procedure Laterality Date  . Orif radius & ulna fractures  2007    left  . Colonoscopy    . Foot fasciotomy      age 8-rt  . Knee arthroscopy with medial menisectomy Left 06/28/2014    Procedure: LEFT KNEE ARTHROSCOPY CHONDROPLASTY/WITH MEDIAL/LATERAL MENISECTOMIES;  Surgeon: Thera FlakeW D Caffrey Jr., Raymond [name];  Location: [name];  Service: Orthopedics;  Laterality: Left;  . Knee arthroscopy with lateral menisectomy Left 06/28/2014    Procedure: KNEE  ARTHROSCOPY WITH LATERAL MENISECTOMY;  Surgeon: Thera FlakeW D Caffrey Jr., Raymond [name];  Location: [facility];  Service: Orthopedics;  Laterality: Left;  . Chondroplasty Left 06/28/2014    Procedure: CHONDROPLASTY;  Surgeon: Thera FlakeW D Caffrey Jr., Raymond [name];  Location: [facility];  Service: Orthopedics;  Laterality: Left;     Current Outpatient Prescriptions  Medication Sig Dispense Refill  . albuterol (PROVENTIL HFA;VENTOLIN HFA) 108 (90 BASE) MCG/ACT inhaler Inhale 1-2 puffs into the lungs every 6 (six) hours as needed for wheezing or shortness of breath. 1 Inhaler 0  . allopurinol (ZYLOPRIM) 300 MG tablet Take 300 mg by mouth daily.    [name]. ALPRAZolam (XANAX) 0.5 MG tablet Take 0.5 mg by mouth at bedtime as needed for anxiety.    [name]. amLODipine-benazepril (LOTREL) 5-20 MG per capsule Take 1 capsule by mouth 2 (two) times daily.    [name]. aspirin 81 MG tablet Take 81 mg by mouth daily.    [name]. atorvastatin (LIPITOR) 10 MG tablet Take 10 mg by mouth at bedtime.     . carvedilol (COREG) 25 MG tablet Take 25 mg by mouth 2 (two) times daily with a meal.    .  doxazosin (CARDURA) 2 MG tablet Take 2 mg by mouth at bedtime.    . furosemide (LASIX) 80 MG tablet Take 80 mg by mouth 2 (two) times daily. Take 2 tablets by mouth twice a day    . gabapentin (NEURONTIN) 600 MG tablet Take 600 mg by mouth at bedtime.    [name]. glipiZIDE (GLUCOTROL) 5 MG tablet Take 5 mg by mouth 2 (two) times daily.    [name]. LISINOPRIL PO Take by mouth.    . losartan (COZAAR) 50 MG tablet Take 50 mg by mouth daily.    . metFORMIN (GLUCOPHAGE-XR) 500 MG 24 hr tablet Take 500 mg by mouth daily. Take 2 tablets by mouth in the morning    . omeprazole (PRILOSEC) 40 MG capsule Take 40 mg by mouth daily.    . potassium chloride SA (K-DUR,KLOR-CON) 20 MEQ tablet Take 20 mEq by mouth 2 (two) times daily.    . traMADol (ULTRAM) 50 MG tablet Take 50 mg by mouth as needed. For pain     No current facility-administered medications for this visit.     Allergies:   Sulfa antibiotics    Social History:  The patient  reports that he has been smoking.  He does not have any smokeless tobacco history on file. He reports that he drinks alcohol. He reports that he does not use illicit drugs.   Family History:  The patient's family history includes Cancer in his other; Diabetes in his father and mother; Hypertension in his father and mother.    ROS:  Please see the history of present illness.   Otherwise, review of systems are positive for none.   All other systems are reviewed and negative.    PHYSICAL EXAM: VS:  BP 147/83 mmHg  Pulse 82  Ht 5\' 11"  (1.803 m)  Wt 377 lb 6.4 oz (171.188 kg)  BMI 52.66 kg/m2  SpO2 94% , BMI Body mass index is 52.66 kg/(m^2). GEN: Well nourished, well developed, in no acute distress HEENT: normal Neck: no JVD, carotid bruits, or masses Cardiac: RRR; no murmurs, rubs, or gallops,no edema  Respiratory:  clear to auscultation bilaterally, normal work of breathing GI: soft, nontender, nondistended, + BS MS: no deformity or atrophy Skin: warm and dry, no rash Neuro:  Strength and sensation are intact Psych: euthymic mood, full affect   EKG:  EKG was not  ordered today.    Recent Labs: 07/19/2014: ALT 32; BUN 6; Creatinine 0.87; Hemoglobin 14.3; Platelets 260; Potassium 3.4*; Sodium 136    Lipid Panel No results found for: CHOL, TRIG, HDL, CHOLHDL, VLDL, LDLCALC, LDLDIRECT    Wt Readings from Last 3 Encounters:  10/05/14 377 lb 6.4 oz (171.188 kg)  06/28/14 371 lb 4 oz (168.398 kg)  04/16/14 365 lb (165.563 kg)        ASSESSMENT AND PLAN:  1.  Chronic LE edema which is most likely related to chronic venous insufficiency from morbid obesity.  Given his significant history of alcohol abuse will get an echo to rule out alcoholic DCM.  Continue Lasix. 2.  Chronic SOB that seems to be related to weight gain.  Diff Dx includes obesity, COPD, CHF from DCM (ETOH induced or CAD) or chronic PE.   Will get a stat D-Dimer.  Will get a 2 day Lexiscan myoview to rule out ischemia.  Check 2D echo to assess LVF. 3.  Morbid obesity 4.  Chest pain somewhat atypical and is nonexertional - he has a long history of tobacco  abuse as well as HTN, obesity and DM.  I will get a 2 day Lexiscan myoview to rule out ischemia.     Current medicines are reviewed at length with the patient today.  The patient does not have concerns regarding medicines.  The following changes have been made:  no change  Labs/ tests ordered today include: see above assessment and plan No orders of the defined types were placed in this encounter.     Disposition:   FU with me PRN pending results of studies  Signed, Quintella Reichert, Raymond [name]  10/05/2014 2:11 PM    [facility] [address] [place], [place], [place]  [address] Phone: [phone]; Fax: [phone]

## 2014-10-05 NOTE — Patient Instructions (Signed)
Medication Instructions:  Your physician recommends that you continue on your current medications as directed. Please refer to the Current Medication list given to you today.   Labwork: TODAY: BMET, CBC, TSH, DDimer  Testing/Procedures: Your physician has requested that you have an echocardiogram. Echocardiography is a painless test that uses sound waves to create images of your heart. It provides your doctor with information about the size and shape of your heart and how well your heart's chambers and valves are working. This procedure takes approximately one hour. There are no restrictions for this procedure.  Dr. Mayford Knifeurner recommends you have an EXERCISE MYOVIEW.  Follow-Up: Your physician recommends that you schedule a follow-up appointment AS NEEDED with Dr. Mayford Knifeurner pending your test results.   Any Other Special Instructions Will Be Listed Below (If Applicable).

## 2014-10-05 NOTE — Telephone Encounter (Signed)
Patient notified that D-Dimer is elevated.  I have instructed the patient to [facility] to get Chest CT angio to rule out PE as etiology of SOB.  He agrees to proceed to ER tonight.

## 2014-10-06 ENCOUNTER — Ambulatory Visit (HOSPITAL_COMMUNITY)
Admission: RE | Admit: 2014-10-06 | Discharge: 2014-10-06 | Disposition: A | Discharge status: Home / Self Care | Payer: 59 | Source: Ambulatory Visit | Attending: Cardiology | Admitting: Cardiology

## 2014-10-06 ENCOUNTER — Encounter (HOSPITAL_COMMUNITY)
Admission: RE | Admit: 2014-10-06 | Discharge: 2014-10-06 | Disposition: A | Discharge status: Home / Self Care | Payer: 59 | Source: Ambulatory Visit | Attending: Cardiology | Admitting: Cardiology

## 2014-10-06 ENCOUNTER — Emergency Department (HOSPITAL_COMMUNITY)
Admission: EM | Admit: 2014-10-06 | Discharge: 2014-10-06 | Disposition: A | Discharge status: Home / Self Care | Payer: 59 | Attending: Emergency Medicine | Admitting: Emergency Medicine

## 2014-10-06 ENCOUNTER — Emergency Department (HOSPITAL_COMMUNITY): Payer: 59

## 2014-10-06 ENCOUNTER — Telehealth: Payer: Self-pay | Admitting: Cardiology

## 2014-10-06 ENCOUNTER — Ambulatory Visit (HOSPITAL_COMMUNITY)
Admission: RE | Admit: 2014-10-06 | Discharge: 2014-10-06 | Disposition: A | Discharge status: Home / Self Care | Payer: 59 | Source: Ambulatory Visit

## 2014-10-06 ENCOUNTER — Encounter (HOSPITAL_COMMUNITY): Payer: Self-pay | Admitting: *Deleted

## 2014-10-06 DIAGNOSIS — M109 Gout, unspecified: Secondary | ICD-10-CM | POA: Insufficient documentation

## 2014-10-06 DIAGNOSIS — K219 Gastro-esophageal reflux disease without esophagitis: Secondary | ICD-10-CM | POA: Diagnosis not present

## 2014-10-06 DIAGNOSIS — R2243 Localized swelling, mass and lump, lower limb, bilateral: Secondary | ICD-10-CM | POA: Diagnosis not present

## 2014-10-06 DIAGNOSIS — Z79899 Other long term (current) drug therapy: Secondary | ICD-10-CM | POA: Insufficient documentation

## 2014-10-06 DIAGNOSIS — E785 Hyperlipidemia, unspecified: Secondary | ICD-10-CM | POA: Diagnosis not present

## 2014-10-06 DIAGNOSIS — R0602 Shortness of breath: Secondary | ICD-10-CM

## 2014-10-06 DIAGNOSIS — Z9981 Dependence on supplemental oxygen: Secondary | ICD-10-CM | POA: Diagnosis not present

## 2014-10-06 DIAGNOSIS — R600 Localized edema: Secondary | ICD-10-CM

## 2014-10-06 DIAGNOSIS — F419 Anxiety disorder, unspecified: Secondary | ICD-10-CM | POA: Diagnosis not present

## 2014-10-06 DIAGNOSIS — I10 Essential (primary) hypertension: Secondary | ICD-10-CM | POA: Insufficient documentation

## 2014-10-06 DIAGNOSIS — E119 Type 2 diabetes mellitus without complications: Secondary | ICD-10-CM | POA: Insufficient documentation

## 2014-10-06 DIAGNOSIS — Z720 Tobacco use: Secondary | ICD-10-CM | POA: Diagnosis not present

## 2014-10-06 DIAGNOSIS — Z7982 Long term (current) use of aspirin: Secondary | ICD-10-CM | POA: Diagnosis not present

## 2014-10-06 DIAGNOSIS — G4730 Sleep apnea, unspecified: Secondary | ICD-10-CM | POA: Diagnosis not present

## 2014-10-06 DIAGNOSIS — R079 Chest pain, unspecified: Secondary | ICD-10-CM | POA: Diagnosis not present

## 2014-10-06 DIAGNOSIS — J441 Chronic obstructive pulmonary disease with (acute) exacerbation: Secondary | ICD-10-CM | POA: Insufficient documentation

## 2014-10-06 LAB — BRAIN NATRIURETIC PEPTIDE: B NATRIURETIC PEPTIDE 5: 60.3 pg/mL (ref 0.0–100.0)

## 2014-10-06 MED ORDER — IOHEXOL 350 MG/ML SOLN
100.0000 mL | Freq: Once | INTRAVENOUS | Status: AC | PRN
  Administered 2014-10-06: 100 mL via INTRAVENOUS

## 2014-10-06 MED ORDER — TECHNETIUM TC 99M DIETHYLENETRIAME-PENTAACETIC ACID: 40.0000 | Freq: Once | INTRAVENOUS | Status: AC | PRN

## 2014-10-06 MED ORDER — TECHNETIUM TO 99M ALBUMIN AGGREGATED
6.0000 | Freq: Once | INTRAVENOUS | Status: AC | PRN
  Administered 2014-10-06: 6 via INTRAVENOUS

## 2014-10-06 MED ORDER — ALBUTEROL SULFATE (2.5 MG/3ML) 0.083% IN NEBU
5.0000 mg | INHALATION_SOLUTION | Freq: Once | RESPIRATORY_TRACT | Status: AC
  Administered 2014-10-06: 5 mg via RESPIRATORY_TRACT
  Filled 2014-10-06: qty 6

## 2014-10-06 NOTE — Discharge Instructions (Signed)
Shortness of Breath  Shortness of breath means you have trouble breathing. It could also mean that you have a medical problem. You should get immediate medical care for shortness of breath.  CAUSES   · Not enough oxygen in the air such as with high altitudes or a smoke-filled room.  · Certain lung diseases, infections, or problems.  · Heart disease or conditions, such as angina or heart failure.  · Low red blood cells (anemia).  · Poor physical fitness, which can cause shortness of breath when you exercise.  · Chest or back injuries or stiffness.  · Being overweight.  · Smoking.  · Anxiety, which can make you feel like you are not getting enough air.  DIAGNOSIS   Serious medical problems can often be found during your physical exam. Tests Hoffman also be done to determine why you are having shortness of breath. Tests [place] include:  · Chest X-rays.  · Lung function tests.  · Blood tests.  · An electrocardiogram (ECG).  · An ambulatory electrocardiogram. An ambulatory ECG records your heartbeat patterns over a 24-hour period.  · Exercise testing.  · A transthoracic echocardiogram (TTE). During echocardiography, sound waves are used to evaluate how blood flows through your heart.  · A transesophageal echocardiogram (TEE).  · Imaging scans.  Your health care provider Halter not be able to find a cause for your shortness of breath after your exam. In this case, it is important to have a follow-up exam with your health care provider as directed.   TREATMENT   Treatment for shortness of breath depends on the cause of your symptoms and can vary greatly.  HOME CARE INSTRUCTIONS   · Do not smoke. Smoking is a common cause of shortness of breath. If you smoke, ask for help to quit.  · Avoid being around chemicals or things that Leib bother your breathing, such as paint fumes and dust.  · Rest as needed. Slowly resume your usual activities.  · If medicines were prescribed, take them as directed for the full length of time directed. This  includes oxygen and any inhaled medicines.  · Keep all follow-up appointments as directed by your health care provider.  SEEK MEDICAL CARE IF:   · Your condition does not improve in the time expected.  · You have a hard time doing your normal activities even with rest.  · You have any new symptoms.  SEEK IMMEDIATE MEDICAL CARE IF:   · Your shortness of breath gets worse.  · You feel light-headed, faint, or develop a cough not controlled with medicines.  · You start coughing up blood.  · You have pain with breathing.  · You have chest pain or pain in your arms, shoulders, or abdomen.  · You have a fever.  · You are unable to walk up stairs or exercise the way you normally do.  MAKE SURE YOU:  · Understand these instructions.  · Will watch your condition.  · Will get help right away if you are not doing well or get worse.  Document Released: 02/11/2001 Document Revised: 05/24/2013 Document Reviewed: 08/04/2011  ExitCare® Patient Information ©2015 [employer]. This information is not intended to replace advice given to you by your health care provider. Make sure you discuss any questions you have with your health care provider.

## 2014-10-06 NOTE — Addendum Note (Signed)
Addended by: Gunnar FusiKEMP,  A on: 10/06/2014 11:16 AM   Modules accepted: Orders

## 2014-10-06 NOTE — ED Provider Notes (Signed)
CSN: [id number]     Arrival date & time 10/06/14  0010 History   This chart was scribed for Pricilla LovelessScott , MD by Freida Busmaniana Omoyeni, ED Scribe. This patient was seen in room D35C/D35C and the patient's care was started 12:23 AM.    Chief Complaint  Patient presents with  . Shortness of Breath   The history is provided by the patient and the spouse. No language interpreter was used.     HPI Comments:  Raymond [name] is a 49 y.o. male with a h/o COPD, Sleep Apnea and HTN who presents to the Emergency Department complaining of chronic SOB and productive cough. He notes he has been experiencing these symptoms for years but they have gradually worsened over the course of the last 3 months. He reports associated chest tightness and  BLE swelling. He notes at this time the swelling to his lower extremities is better than usual. He denies CP. Pt saw Dr. Mayford Knifeurner today for regular check up and had blood work done. Pt received a phone call this evening advising the pt to come to the ED due to abnormal D-dimer and to get a CT scan. Pt is currently on lasix and compliant with dose. He is also currently a smoker. No alleviating factors noted.   Past Medical History  Diagnosis Date  . Hypertension   . Gout   . Diabetes mellitus without complication   . Sleep apnea     uses a cpap  . Heavy smoker   . COPD (chronic obstructive pulmonary disease)   . GERD (gastroesophageal reflux disease)   . Anxiety   . Edema   . Hyperlipidemia    Past Surgical History  Procedure Laterality Date  . Orif radius & ulna fractures  2007    left  . Colonoscopy    . Foot fasciotomy      age 35-rt  . Knee arthroscopy with medial menisectomy Left 06/28/2014    Procedure: LEFT KNEE ARTHROSCOPY CHONDROPLASTY/WITH MEDIAL/LATERAL MENISECTOMIES;  Surgeon: Thera FlakeW D Caffrey Jr., MD;  Location: [facility];  Service: Orthopedics;  Laterality: Left;  . Knee arthroscopy with lateral menisectomy Left 06/28/2014    Procedure: KNEE  ARTHROSCOPY WITH LATERAL MENISECTOMY;  Surgeon: Thera FlakeW D Caffrey Jr., MD;  Location: [facility];  Service: Orthopedics;  Laterality: Left;  . Chondroplasty Left 06/28/2014    Procedure: CHONDROPLASTY;  Surgeon: Thera FlakeW D Caffrey Jr., MD;  Location: [facility];  Service: Orthopedics;  Laterality: Left;   Family History  Problem Relation Age of Onset  . Diabetes Mother   . Hypertension Mother   . Diabetes Father   . Hypertension Father   . Cancer Other    History  Substance Use Topics  . Smoking status: Current Every Day Smoker -- 2.00 packs/day  . Smokeless tobacco: Not on file  . Alcohol Use: Yes     Comment: occ    Review of Systems  Constitutional: Negative for fever.  Respiratory: Positive for cough, chest tightness and shortness of breath.   Cardiovascular: Positive for leg swelling. Negative for chest pain.  Gastrointestinal: Negative for vomiting.  All other systems reviewed and are negative.     Allergies  Sulfa antibiotics  Home Medications   Prior to Admission medications   Medication Sig Start Date End Date Taking? Authorizing Provider  albuterol (PROVENTIL HFA;VENTOLIN HFA) 108 (90 BASE) MCG/ACT inhaler Inhale 1-2 puffs into the lungs every 6 (six) hours as needed for wheezing or shortness of  breath. 06/02/14   Reuben Likes, MD  allopurinol (ZYLOPRIM) 300 MG tablet Take 300 mg by mouth daily.    Historical Provider, MD  ALPRAZolam Prudy Feeler) 0.5 MG tablet Take 0.5 mg by mouth at bedtime as needed for anxiety.    Historical Provider, MD  amLODipine-benazepril (LOTREL) 5-20 MG per capsule Take 1 capsule by mouth 2 (two) times daily. 10/03/14   Historical Provider, MD  aspirin 81 MG tablet Take 81 mg by mouth daily.    Historical Provider, MD  atorvastatin (LIPITOR) 10 MG tablet Take 10 mg by mouth at bedtime.     Historical Provider, MD  carvedilol (COREG) 25 MG tablet Take 25 mg by mouth 2 (two) times daily with a meal.    Historical Provider, MD   doxazosin (CARDURA) 2 MG tablet Take 2 mg by mouth at bedtime. 07/25/14   Historical Provider, MD  furosemide (LASIX) 80 MG tablet Take 80 mg by mouth 2 (two) times daily. Take 2 tablets by mouth twice a day    Historical Provider, MD  gabapentin (NEURONTIN) 600 MG tablet Take 600 mg by mouth at bedtime.    Historical Provider, MD  glipiZIDE (GLUCOTROL) 5 MG tablet Take 5 mg by mouth 2 (two) times daily.    Historical Provider, MD  LISINOPRIL PO Take by mouth.    Historical Provider, MD  losartan (COZAAR) 50 MG tablet Take 50 mg by mouth daily.    Historical Provider, MD  metFORMIN (GLUCOPHAGE-XR) 500 MG 24 hr tablet Take 500 mg by mouth daily. Take 2 tablets by mouth in the morning 09/27/14   Historical Provider, MD  omeprazole (PRILOSEC) 40 MG capsule Take 40 mg by mouth daily.    Historical Provider, MD  potassium chloride SA (K-DUR,KLOR-CON) 20 MEQ tablet Take 20 mEq by mouth 2 (two) times daily.    Historical Provider, MD  traMADol (ULTRAM) 50 MG tablet Take 50 mg by mouth as needed. For pain 09/26/14   Historical Provider, MD   BP 173/88 mmHg  Pulse 97  Temp(Src) 97.9 F (36.6 C) (Oral)  Resp 26  SpO2 94% Physical Exam  Constitutional: He is oriented to person, place, and time. He appears well-developed and well-nourished. No distress.  Morbidly obese   HENT:  Head: Normocephalic and atraumatic.  Right Ear: External ear normal.  Left Ear: External ear normal.  Nose: Nose normal.  Eyes: Right eye exhibits no discharge. Left eye exhibits no discharge.  Neck: Neck supple.  Cardiovascular: Normal rate, regular rhythm, normal heart sounds and intact distal pulses.   Pulmonary/Chest: Effort normal. Wheezes: Mild expiratory; intermittent.  Abdominal: Soft. There is no tenderness.  Musculoskeletal: He exhibits edema (Bilateral pitting edema to lower extremities).  Neurological: He is alert and oriented to person, place, and time.  Skin: Skin is warm and dry. He is not diaphoretic.   Nursing note and vitals reviewed.   ED Course  Procedures   DIAGNOSTIC STUDIES:  Oxygen Saturation is 94% on RA, adequate by my interpretation.    COORDINATION OF CARE:  12:27 AM Discussed treatment plan with pt at bedside and pt agreed to plan.  Labs Review Labs Reviewed  BRAIN NATRIURETIC PEPTIDE    Imaging Review Ct Angio Chest Pe W/cm &/or Wo Cm  10/06/2014   CLINICAL DATA:  Chest pain with shortness of breath. Elevated D-dimer. Progressive worsening of shortness breath.  EXAM: CT ANGIOGRAPHY CHEST WITH CONTRAST  TECHNIQUE: Multidetector CT imaging of the chest was performed using the standard protocol during  bolus administration of intravenous contrast. Multiplanar CT image reconstructions and MIPs were obtained to evaluate the vascular anatomy.  CONTRAST:  100mL OMNIPAQUE IOHEXOL 350 MG/ML SOLN  COMPARISON:  Radiograph 09/16/2014  FINDINGS: Exam is suboptimal due to patient body habitus which results in attenuation of the CT beam.  Mediastinum/Nodes: No filling defects within the proximal pulmonary arteries to suggest acute pulmonary embolism. The subsegmental pulmonary arteries are poorly evaluated due to quantum mottle secondary patient body habitus.  No acute findings aorta great vessels. No pericardial fluid. Esophagus is normal. No mediastinal adenopathy.  Lungs/Pleura: Review of the lung parenchyma demonstrates no airspace disease or edema. No pleural fluid or pneumothorax.  Upper abdomen: Limited view of the liver, kidneys, pancreas are unremarkable. Normal adrenal glands.  Musculoskeletal: No aggressive osseous lesion.  Review of the MIP images confirms the above findings.  IMPRESSION: 1. No large central pulmonary embolism. The segmental pulmonary arteries are poorly evaluated secondary to patient body habitus. This patient Martenson be better assessed with V/Q scan for evaluation of pulmonary embolism in the future. 2. No acute pulmonary parenchymal findings.   Electronically Signed    By: Genevive BiStewart  Edmunds M.D.   On: 10/06/2014 02:33     EKG Interpretation   Date/Time:  Friday [place] 06 2016 00:31:54 EDT Ventricular Rate:  94 PR Interval:  176 QRS Duration: 104 QT Interval:  404 QTC Calculation: 505 R Axis:   73 Text Interpretation:  Normal sinus rhythm Low voltage QRS Cannot rule out  Anterior infarct , age undetermined Prolonged QT Abnormal ECG no  significant change since 2012 Confirmed by   MD,  ([id number]) on  10/06/2014 1:19:57 AM      MDM   Final diagnoses:  Shortness of breath    Patient CT scan shows no obvious pulmonary embolism. Limited due to obesity. However patient has been having these symptoms for quite some time and I have low suspicion for a pulmonary embolism. Has peripheral edema on exam but BNP is normal and no signs of pulmonary edema on CT. Patient's other labs from earlier in the day are reviewed and unremarkable. This seems to be a more chronic problem is likely more lung related given his smoking history. Will refer back to Dr. Mayford Knifeurner, stable for discharge at this time.   I personally performed the services described in this documentation, which was scribed in my presence. The recorded information has been reviewed and is accurate.     Pricilla LovelessScott , MD 10/06/14 [phone]

## 2014-10-06 NOTE — Telephone Encounter (Signed)
Left message to call back.  VQ scan ordered for scheduling.

## 2014-10-06 NOTE — Telephone Encounter (Signed)
Confirmed with patient orders and he agrees with treatment plan. [facility].

## 2014-10-06 NOTE — Telephone Encounter (Signed)
Patient had chest CT angio that did not show any central PE but could not evaluate adequately peripherally.  Please order a VQ scan to rule out PE today.

## 2014-10-06 NOTE — ED Notes (Signed)
Pt arrives from home. Pt went to see Dr Mayford Knifeurner today and had blood work done. Dr Mayford Knifeurner reported a d-dimer of 1.48 and sent pt to ED. Pt c/o SOB and denies CP.

## 2014-10-16 ENCOUNTER — Telehealth (HOSPITAL_COMMUNITY): Payer: Self-pay | Admitting: *Deleted

## 2014-10-16 NOTE — Telephone Encounter (Signed)
Patient given detailed instructions per Myocardial Perfusion Study Information Sheet for test on 10/17/14 at 0730. Patient verbalized understanding. , Adelene IdlerCynthia W

## 2014-10-17 ENCOUNTER — Ambulatory Visit (HOSPITAL_COMMUNITY): Payer: 59 | Attending: Cardiovascular Disease

## 2014-10-17 ENCOUNTER — Ambulatory Visit (HOSPITAL_COMMUNITY): Payer: 59

## 2014-10-17 DIAGNOSIS — R0602 Shortness of breath: Secondary | ICD-10-CM

## 2014-10-17 MED ORDER — REGADENOSON 0.4 MG/5ML IV SOLN
0.4000 mg | Freq: Once | INTRAVENOUS | Status: AC
  Administered 2014-10-17: 0.4 mg via INTRAVENOUS

## 2014-10-17 MED ORDER — TECHNETIUM TC 99M SESTAMIBI GENERIC - CARDIOLITE
33.0000 | Freq: Once | INTRAVENOUS | Status: AC | PRN
  Administered 2014-10-17: 33 via INTRAVENOUS

## 2014-10-19 ENCOUNTER — Telehealth: Payer: Self-pay | Admitting: Cardiology

## 2014-10-19 ENCOUNTER — Ambulatory Visit (HOSPITAL_COMMUNITY): Payer: 59 | Attending: Cardiology

## 2014-10-19 ENCOUNTER — Ambulatory Visit (HOSPITAL_BASED_OUTPATIENT_CLINIC_OR_DEPARTMENT_OTHER): Payer: 59

## 2014-10-19 ENCOUNTER — Other Ambulatory Visit: Payer: Self-pay

## 2014-10-19 ENCOUNTER — Other Ambulatory Visit: Payer: Self-pay | Admitting: Cardiology

## 2014-10-19 DIAGNOSIS — E119 Type 2 diabetes mellitus without complications: Secondary | ICD-10-CM | POA: Diagnosis not present

## 2014-10-19 DIAGNOSIS — R0602 Shortness of breath: Secondary | ICD-10-CM | POA: Diagnosis not present

## 2014-10-19 DIAGNOSIS — E785 Hyperlipidemia, unspecified: Secondary | ICD-10-CM | POA: Diagnosis not present

## 2014-10-19 DIAGNOSIS — J449 Chronic obstructive pulmonary disease, unspecified: Secondary | ICD-10-CM | POA: Insufficient documentation

## 2014-10-19 DIAGNOSIS — Z6841 Body Mass Index (BMI) 40.0 and over, adult: Secondary | ICD-10-CM | POA: Diagnosis not present

## 2014-10-19 DIAGNOSIS — I10 Essential (primary) hypertension: Secondary | ICD-10-CM | POA: Diagnosis not present

## 2014-10-19 DIAGNOSIS — R600 Localized edema: Secondary | ICD-10-CM | POA: Diagnosis not present

## 2014-10-19 LAB — MYOCARDIAL PERFUSION IMAGING
CHL CUP NUCLEAR SDS: 2
CHL CUP NUCLEAR SRS: 1
CHL CUP NUCLEAR SSS: 3
CHL CUP STRESS STAGE 1 HR: 78 {beats}/min
CHL CUP STRESS STAGE 2 GRADE: 0 %
CHL CUP STRESS STAGE 3 GRADE: 0 %
CHL CUP STRESS STAGE 3 HR: 88 {beats}/min
CHL CUP STRESS STAGE 3 SBP: 157 mmHg
CHL CUP STRESS STAGE 4 GRADE: 0 %
CHL CUP STRESS STAGE 5 GRADE: 0 %
CHL CUP STRESS STAGE 5 SBP: 159 mmHg
CHL CUP STRESS STAGE 5 SPEED: 0 mph
CHL CUP STRESS STAGE 6 DBP: 87 mmHg
CHL CUP STRESS STAGE 6 GRADE: 0 %
CSEPPHR: 94 {beats}/min
CSEPPMHR: 53 %
LV dias vol: 188 mL
LV sys vol: 76 mL
NUC STRESS TID: 1.03
Nuc Stress EF: 59 %
RATE: 0.36
Rest HR: 71 {beats}/min
Stage 1 DBP: 86 mmHg
Stage 1 Grade: 0 %
Stage 1 SBP: 149 mmHg
Stage 1 Speed: 0 mph
Stage 2 HR: 78 {beats}/min
Stage 2 Speed: 0 mph
Stage 3 DBP: 89 mmHg
Stage 3 Speed: 0 mph
Stage 4 HR: 90 {beats}/min
Stage 4 Speed: 0 mph
Stage 5 DBP: 88 mmHg
Stage 5 HR: 82 {beats}/min
Stage 6 HR: 83 {beats}/min
Stage 6 SBP: 151 mmHg
Stage 6 Speed: 0 mph

## 2014-10-19 MED ORDER — TECHNETIUM TC 99M SESTAMIBI GENERIC - CARDIOLITE
33.0000 | Freq: Once | INTRAVENOUS | Status: AC | PRN
  Administered 2014-10-19: 33 via INTRAVENOUS

## 2014-10-19 MED ORDER — PERFLUTREN LIPID MICROSPHERE
3.0000 mL | Freq: Once | INTRAVENOUS | Status: AC
  Administered 2014-10-19: 3 mL via INTRAVENOUS

## 2014-10-19 NOTE — Telephone Encounter (Signed)
New message    Patient calling wanted to know what time is his appt todaya    C/o not sleeping at night.  Up / down all night   145/100 this am blood pressure .    Everything hurts right now.

## 2014-10-19 NOTE — Telephone Encounter (Signed)
Confirmed with patient his test starts at 10:30 this AM. Patient st he is not sleeping since he came for his OV.  He only sleeps 3 or so hours a night and he is so exhausted he does not feel well.  Patient does not complain of chest pain - but does complain of generalized aching and fatigue and tiredness. Patient st he does not know if he wants to keep his scheduled testing today because he is too tired. Informed the patient that it seems he Forst not be sleeping because he is stressed out since his last OV.  Patient agrees that he has been really worried. Instructed the patient to keep his appointments because getting test results Breiner help him relieve some stress. Patient agrees with treatment plan and st he will be here for his appointments today.

## 2014-10-25 ENCOUNTER — Telehealth: Payer: Self-pay | Admitting: Cardiology

## 2014-10-25 NOTE — Telephone Encounter (Signed)
Called patient back about his recent test results and next step. In Dr. Norris Crossurner's note, patient is to follow up as needed, since test results were fine. Informed patient that diet and exercise can help with his blood pressure. Patient asked about drinking alcohol and smoking cigarettes. Informed patient that neither one of these things would help his health. Encouraged patient to cut back on alcohol and cigarettes.  Patient verbalized understanding.

## 2014-10-25 NOTE — Telephone Encounter (Signed)
New message    Patient calling stating he has not heard anything regarding test results or next step.  Would like a call back today

## 2015-08-12 ENCOUNTER — Encounter (HOSPITAL_COMMUNITY): Payer: Self-pay | Admitting: Emergency Medicine

## 2015-08-12 ENCOUNTER — Emergency Department (HOSPITAL_COMMUNITY)
Admission: EM | Admit: 2015-08-12 | Discharge: 2015-08-12 | Disposition: A | Discharge status: Home / Self Care | Payer: 59 | Attending: Emergency Medicine | Admitting: Emergency Medicine

## 2015-08-12 ENCOUNTER — Emergency Department (HOSPITAL_COMMUNITY): Payer: 59

## 2015-08-12 DIAGNOSIS — J441 Chronic obstructive pulmonary disease with (acute) exacerbation: Secondary | ICD-10-CM | POA: Diagnosis not present

## 2015-08-12 DIAGNOSIS — Z7984 Long term (current) use of oral hypoglycemic drugs: Secondary | ICD-10-CM | POA: Diagnosis not present

## 2015-08-12 DIAGNOSIS — Z791 Long term (current) use of non-steroidal anti-inflammatories (NSAID): Secondary | ICD-10-CM | POA: Insufficient documentation

## 2015-08-12 DIAGNOSIS — E119 Type 2 diabetes mellitus without complications: Secondary | ICD-10-CM | POA: Insufficient documentation

## 2015-08-12 DIAGNOSIS — G4730 Sleep apnea, unspecified: Secondary | ICD-10-CM | POA: Insufficient documentation

## 2015-08-12 DIAGNOSIS — Z9981 Dependence on supplemental oxygen: Secondary | ICD-10-CM | POA: Diagnosis not present

## 2015-08-12 DIAGNOSIS — F419 Anxiety disorder, unspecified: Secondary | ICD-10-CM | POA: Insufficient documentation

## 2015-08-12 DIAGNOSIS — L259 Unspecified contact dermatitis, unspecified cause: Secondary | ICD-10-CM | POA: Diagnosis not present

## 2015-08-12 DIAGNOSIS — M109 Gout, unspecified: Secondary | ICD-10-CM | POA: Diagnosis not present

## 2015-08-12 DIAGNOSIS — K219 Gastro-esophageal reflux disease without esophagitis: Secondary | ICD-10-CM | POA: Diagnosis not present

## 2015-08-12 DIAGNOSIS — F17210 Nicotine dependence, cigarettes, uncomplicated: Secondary | ICD-10-CM | POA: Insufficient documentation

## 2015-08-12 DIAGNOSIS — I10 Essential (primary) hypertension: Secondary | ICD-10-CM | POA: Diagnosis not present

## 2015-08-12 DIAGNOSIS — E785 Hyperlipidemia, unspecified: Secondary | ICD-10-CM | POA: Insufficient documentation

## 2015-08-12 DIAGNOSIS — R0602 Shortness of breath: Secondary | ICD-10-CM | POA: Diagnosis present

## 2015-08-12 DIAGNOSIS — Z79899 Other long term (current) drug therapy: Secondary | ICD-10-CM | POA: Diagnosis not present

## 2015-08-12 MED ORDER — PREDNISONE 20 MG PO TABS
60.0000 mg | ORAL_TABLET | Freq: Once | ORAL | Status: AC
  Administered 2015-08-12: 60 mg via ORAL
  Filled 2015-08-12: qty 3

## 2015-08-12 MED ORDER — PREDNISONE 20 MG PO TABS: ORAL_TABLET | ORAL | Status: DC

## 2015-08-12 MED ORDER — HYDROXYZINE HCL 25 MG PO TABS: 25.0000 mg | ORAL_TABLET | Freq: Four times a day (QID) | ORAL | Status: DC

## 2015-08-12 MED ORDER — HYDROXYZINE HCL 25 MG PO TABS
25.0000 mg | ORAL_TABLET | Freq: Once | ORAL | Status: AC
  Administered 2015-08-12: 25 mg via ORAL
  Filled 2015-08-12: qty 1

## 2015-08-12 MED ORDER — ALBUTEROL SULFATE (2.5 MG/3ML) 0.083% IN NEBU
2.5000 mg | INHALATION_SOLUTION | RESPIRATORY_TRACT | Status: DC | PRN
  Administered 2015-08-12: 2.5 mg via RESPIRATORY_TRACT
  Filled 2015-08-12: qty 3

## 2015-08-12 NOTE — ED Provider Notes (Addendum)
CSN: [id number]     Arrival date & time 08/12/15  1938 History   First MD Initiated Contact with Patient 08/12/15 2002     Chief Complaint  Patient presents with  . Shortness of Breath  . Numbness    bilateral, hx of same     HPI  Patient presents evaluation of a rash on his legs and abdomen.  He also states that he became short of breath and admits to get somewhat anxious at home before coming here tonight.  Patient is a lifelong smoker. Has a history of COPD. Has nebulizer, and inhalers at home. States he "never uses them".  Also history of hypertension, NIDDM-2.    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 He states he stays in hotels often. About 3 weeks ago he was "developed a rash on the buttocks. Seen by physician and prescribed permethrin for scabies. They cleaned there home cleaning the truck. Used the permethrin. He had no reaction on first application. They reapplied it about 8 days ago. 2-3 days later he started noticing redness and itching on his legs and arms abdominal wall. This is worsened. His legs are somewhat swollen.  Past Medical History  Diagnosis Date  . Hypertension   . Gout   . Diabetes mellitus without complication (HCC)   . Sleep apnea     uses a cpap  . Heavy smoker   . COPD (chronic obstructive pulmonary disease) (HCC)   . GERD (gastroesophageal reflux disease)   . Anxiety   . Edema   . Hyperlipidemia    Past Surgical History  Procedure Laterality Date  . Orif radius & ulna fractures  2007    left  . Colonoscopy    . Foot fasciotomy      age 36-rt  . Knee arthroscopy with medial menisectomy Left 06/28/2014    Procedure: LEFT KNEE ARTHROSCOPY CHONDROPLASTY/WITH MEDIAL/LATERAL MENISECTOMIES;  Surgeon: Thera FlakeW D Caffrey Jr., MD;  Location: [facility];  Service: Orthopedics;  Laterality: Left;  . Knee arthroscopy with lateral menisectomy Left 06/28/2014    Procedure: KNEE ARTHROSCOPY WITH LATERAL MENISECTOMY;  Surgeon: Thera FlakeW D Caffrey Jr.,  MD;  Location: [facility];  Service: Orthopedics;  Laterality: Left;  . Chondroplasty Left 06/28/2014    Procedure: CHONDROPLASTY;  Surgeon: Thera FlakeW D Caffrey Jr., MD;  Location: [facility];  Service: Orthopedics;  Laterality: Left;   Family History  Problem Relation Age of Onset  . Diabetes Mother   . Hypertension Mother   . Diabetes Father   . Hypertension Father   . Cancer Other    Social History  Substance Use Topics  . Smoking status: Current Every Day Smoker -- 2.00 packs/day    Types: Cigarettes  . Smokeless tobacco: None  . Alcohol Use: Yes     Comment: weekends     Review of Systems  Constitutional: Negative for fever, chills, diaphoresis, appetite change and fatigue.  HENT: Negative for mouth sores, sore throat and trouble swallowing.   Eyes: Negative for visual disturbance.  Respiratory: Positive for cough and shortness of breath. Negative for chest tightness and wheezing.   Cardiovascular: Negative for chest pain.  Gastrointestinal: Negative for nausea, vomiting, abdominal pain, diarrhea and abdominal distention.  Endocrine: Negative for polydipsia, polyphagia and polyuria.  Genitourinary: Negative for dysuria, frequency and hematuria.  Musculoskeletal: Negative for gait problem.  Skin: Positive for rash. Negative for color change and pallor.       Itching.  Neurological:  Negative for dizziness, syncope, light-headedness and headaches.  Hematological: Does not bruise/bleed easily.  Psychiatric/Behavioral: Negative for behavioral problems and confusion.      Allergies  Sulfa antibiotics  Home Medications   Prior to Admission medications   Medication Sig Start Date End Date Taking? Authorizing Provider  albuterol (PROVENTIL HFA;VENTOLIN HFA) 108 (90 BASE) MCG/ACT inhaler Inhale 1-2 puffs into the lungs every 6 (six) hours as needed for wheezing or shortness of breath. 06/02/14  Yes Reuben Likes, MD  albuterol (PROVENTIL) (2.5 MG/3ML)  0.083% nebulizer solution Inhale 3 mLs into the lungs daily as needed for wheezing or shortness of breath.  06/25/15  Yes Historical Provider, MD  allopurinol (ZYLOPRIM) 300 MG tablet Take 300 mg by mouth daily.   Yes Historical Provider, MD  ALPRAZolam Prudy Feeler) 0.5 MG tablet Take 0.5 mg by mouth at bedtime as needed for anxiety.   Yes Historical Provider, MD  amLODipine-benazepril (LOTREL) 5-20 MG per capsule Take 1 capsule by mouth 2 (two) times daily. 10/03/14  Yes Historical Provider, MD  Aspirin-Acetaminophen-Caffeine (GOODY HEADACHE PO) Take 1 packet by mouth 2 (two) times daily as needed (pain).   Yes Historical Provider, MD  atorvastatin (LIPITOR) 10 MG tablet Take 10 mg by mouth at bedtime.    Yes Historical Provider, MD  carvedilol (COREG) 25 MG tablet Take 25 mg by mouth 2 (two) times daily with a meal.   Yes Historical Provider, MD  doxazosin (CARDURA) 2 MG tablet Take 2 mg by mouth at bedtime. 07/25/14  Yes Historical Provider, MD  furosemide (LASIX) 80 MG tablet Take 160 mg by mouth 2 (two) times daily.    Yes Historical Provider, MD  gabapentin (NEURONTIN) 600 MG tablet Take 600 mg by mouth at bedtime.   Yes Historical Provider, MD  glipiZIDE (GLUCOTROL) 5 MG tablet Take 5 mg by mouth 2 (two) times daily.   Yes Historical Provider, MD  lisinopril (PRINIVIL,ZESTRIL) 20 MG tablet Take 20 mg by mouth 2 (two) times daily.   Yes Historical Provider, MD  losartan (COZAAR) 50 MG tablet Take 50 mg by mouth daily.   Yes Historical Provider, MD  meloxicam (MOBIC) 7.5 MG tablet Take 7.5 mg by mouth daily.   Yes Historical Provider, MD  metFORMIN (GLUCOPHAGE-XR) 500 MG 24 hr tablet Take 1,000 mg by mouth daily with breakfast.  09/27/14  Yes Historical Provider, MD  omeprazole (PRILOSEC) 40 MG capsule Take 40 mg by mouth daily.   Yes Historical Provider, MD  potassium chloride SA (K-DUR,KLOR-CON) 20 MEQ tablet Take 20 mEq by mouth 2 (two) times daily.   Yes Historical Provider, MD  pramipexole (MIRAPEX)  0.125 MG tablet Take 0.125 mg by mouth daily. 07/27/15  Yes Historical Provider, MD  traMADol (ULTRAM) 50 MG tablet Take 50 mg by mouth every 6 (six) hours as needed for moderate pain. For pain 09/26/14  Yes Historical Provider, MD  nystatin-triamcinolone (MYCOLOG II) cream Apply 1 application topically daily as needed. 08/06/15   Historical Provider, MD   BP 170/131 mmHg  Pulse 112  Temp(Src) 98.2 F (36.8 C) (Oral)  Resp 22  Ht  (1.803 m)  Wt 370 lb (167.831 kg)  BMI 51.63 kg/m2  SpO2 94% Physical Exam  Constitutional: He is oriented to person, place, and time. He appears well-developed and well-nourished. No distress.  HENT:  Head: Normocephalic.  Eyes: Conjunctivae are normal. Pupils are equal, round, and reactive to light. No scleral icterus.  Neck: Normal range of motion. Neck supple. No thyromegaly  present.  Cardiovascular: Normal rate and regular rhythm.  Exam reveals no gallop and no friction rub.   No murmur heard. Pulmonary/Chest: Effort normal. No respiratory distress. He has wheezes in the right upper field, the right middle field, the right lower field, the left upper field, the left middle field and the left lower field. He has no rales.  Wheezing and prolongation in all fields. No crackles. No JVD. No S3 or 4 gallop.  Abdominal: Soft. Bowel sounds are normal. He exhibits no distension. There is no tenderness. There is no rebound.  Musculoskeletal: Normal range of motion.  Neurological: He is alert and oriented to person, place, and time.  Skin: Skin is warm and dry. No rash noted.  Erythematous rash. Linear. Assume associated skin scaling. No sign of infection.  Psychiatric: He has a normal mood and affect. His behavior is normal.    ED Course  Procedures (including critical care time) Labs Review Labs Reviewed - No data to display  Imaging Review Dg Chest 2 View  08/12/2015  CLINICAL DATA:  Acute onset of shortness of breath and bilateral foot numbness.  Initial encounter. EXAM: CHEST  2 VIEW COMPARISON:  Chest radiograph performed 10/06/2014 FINDINGS: The lungs are well-aerated. Vascular congestion is noted, without significant pulmonary edema. There is no evidence of focal opacification, pleural effusion or pneumothorax. Chronic right lateral pleural thickening is noted. The heart is borderline enlarged. No acute osseous abnormalities are seen. IMPRESSION: Vascular congestion and borderline cardiomegaly. Lungs remain grossly clear. Electronically Signed   By: Roanna Raider M.D.   On: 08/12/2015 20:11   I have personally reviewed and evaluated these images and lab results as part of my medical decision-making.   EKG Interpretation None      MDM   Final diagnoses:  Contact dermatitis  COPD exacerbation (HCC)    Nebulized up ago. Symptoms are consistent with contact dermatitis secondary to his permethrin. Plan will be prednisone. Given albuterol neb here. He has albuterol at home. 10 day course prednisone. PCP follow up.    Rolland Porter, MD 08/12/15 1610  Rolland Porter, MD 08/12/15 2051

## 2015-08-12 NOTE — Discharge Instructions (Signed)
Contact Dermatitis Dermatitis is redness, soreness, and swelling (inflammation) of the skin. Contact dermatitis is a reaction to certain substances that touch the skin. You either touched something that irritated your skin, or you have allergies to something you touched.  HOME CARE  Skin Care  Moisturize your skin as needed.  Apply cool compresses to the affected areas.   Try taking a bath with:   Epsom salts. Follow the instructions on the package. You can get these at a pharmacy or grocery store.   Baking soda. Pour a small amount into the bath as told by your doctor.   Colloidal oatmeal. Follow the instructions on the package. You can get this at a pharmacy or grocery store.   Try applying baking soda paste to your skin. Stir water into baking soda until it looks like paste.  Do not scratch your skin.   Bathe less often.  Bathe in lukewarm water. Avoid using hot water.  Medicines  Take or apply over-the-counter and prescription medicines only as told by your doctor.   If you were prescribed an antibiotic medicine, take or apply your antibiotic as told by your doctor. Do not stop taking the antibiotic even if your condition starts to get better. General Instructions  Keep all follow-up visits as told by your doctor. This is important.   Avoid the substance that caused your reaction. If you do not know what caused it, keep a journal to try to track what caused it. Write down:   What you eat.   What cosmetic products you use.   What you drink.   What you wear in the affected area. This includes jewelry.   If you were given a bandage (dressing), take care of it as told by your doctor. This includes when to change and remove it.  GET HELP IF:   You do not get better with treatment.   Your condition gets worse.   You have signs of infection such as:  Swelling.  Tenderness.  Redness.  Soreness.  Warmth.   You have a fever.   You have new  symptoms.  GET HELP RIGHT AWAY IF:   You have a very bad headache.  You have neck pain.  Your neck is stiff.   You throw up (vomit).   You feel very sleepy.   You see red streaks coming from the affected area.   Your bone or joint underneath the affected area becomes painful after the skin has healed.   The affected area turns darker.   You have trouble breathing.  Chronic Obstructive Pulmonary Disease Exacerbation Chronic obstructive pulmonary disease (COPD) is a common lung condition in which airflow from the lungs is limited. COPD is a general term that can be used to describe many different lung problems that limit airflow, including chronic bronchitis and emphysema. COPD exacerbations are episodes when breathing symptoms become much worse and require extra treatment. Without treatment, COPD exacerbations can be life threatening, and frequent COPD exacerbations can cause further damage to your lungs. CAUSES Respiratory infections. Exposure to smoke. Exposure to air pollution, chemical fumes, or dust. Sometimes there is no apparent cause or trigger. RISK FACTORS Smoking cigarettes. Older age. Frequent prior COPD exacerbations. SIGNS AND SYMPTOMS Increased coughing. Increased thick spit (sputum) production. Increased wheezing. Increased shortness of breath. Rapid breathing. Chest tightness. DIAGNOSIS Your medical history, a physical exam, and tests will help your health care provider make a diagnosis. Tests Hilgers include: A chest X-ray. Basic lab tests. Sputum testing.  An arterial blood gas test. TREATMENT Depending on the severity of your COPD exacerbation, you Mcneal need to be admitted to a hospital for treatment. Some of the treatments commonly used to treat COPD exacerbations are:  Antibiotic medicines. Bronchodilators. These are drugs that expand the air passages. They Hagemeister be given with an inhaler or nebulizer. Spacer devices Gerard be needed to help improve  drug delivery. Corticosteroid medicines. Supplemental oxygen therapy. Airway clearing techniques, such as noninvasive ventilation (NIV) and positive expiratory pressure (PEP). These provide respiratory support through a mask or other noninvasive device. HOME CARE INSTRUCTIONS Do not smoke. Quitting smoking is very important to prevent COPD from getting worse and exacerbations from happening as often. Avoid exposure to all substances that irritate the airway, especially to tobacco smoke. If you were prescribed an antibiotic medicine, finish it all even if you start to feel better. Take all medicines as directed by your health care provider.It is important to use correct technique with inhaled medicines. Drink enough fluids to keep your urine clear or pale yellow (unless you have a medical condition that requires fluid restriction). Use a cool mist vaporizer. This makes it easier to clear your chest when you cough. If you have a home nebulizer and oxygen, continue to use them as directed. Maintain all necessary vaccinations to prevent infections. Exercise regularly. Eat a healthy diet. Keep all follow-up appointments as directed by your health care provider. SEEK IMMEDIATE MEDICAL CARE IF: You have worsening shortness of breath. You have trouble talking. You have severe chest pain. You have blood in your sputum. You have a fever. You have weakness, vomit repeatedly, or faint. You feel confused. You continue to get worse. MAKE SURE YOU: Understand these instructions. Will watch your condition. Will get help right away if you are not doing well or get worse.   This information is not intended to replace advice given to you by your health care provider. Make sure you discuss any questions you have with your health care provider.   Document Released: 03/16/2007 Document Revised: 06/09/2014 Document Reviewed: 01/21/2013 Elsevier Interactive Patient Education [employer]    This  information is not intended to replace advice given to you by your health care provider. Make sure you discuss any questions you have with your health care provider.   Document Released: 03/16/2009 Document Revised: 02/07/2015 Document Reviewed: 10/04/2014 Elsevier Interactive Patient Education [employer]

## 2015-08-12 NOTE — ED Notes (Signed)
Patient c/o SOB and numbness to his feet. Patient states this has been going on for about 6 weeks. Patient states he has scabies and was treated for that. Patient states he has swelling and numbness to feet at baseline. Patient able to speak in complete sentences. Patient with reddened and scaly patches and non pitting edema to both lower legs.

## 2016-05-14 DIAGNOSIS — G4733 Obstructive sleep apnea (adult) (pediatric): Secondary | ICD-10-CM | POA: Insufficient documentation

## 2016-05-14 DIAGNOSIS — J984 Other disorders of lung: Secondary | ICD-10-CM | POA: Insufficient documentation

## 2016-05-14 DIAGNOSIS — J449 Chronic obstructive pulmonary disease, unspecified: Secondary | ICD-10-CM | POA: Insufficient documentation

## 2016-05-14 DIAGNOSIS — F17210 Nicotine dependence, cigarettes, uncomplicated: Secondary | ICD-10-CM | POA: Insufficient documentation

## 2016-05-22 IMAGING — CT CT HEAD W/O CM
2 series · 15 of 30 positions shown, 19 images · non-contrast
Comparison: None.

CLINICAL DATA: Left-sided headache with sensation of facial
pressure

EXAM:
CT HEAD WITHOUT CONTRAST
TECHNIQUE: Contiguous axial images were obtained from the base of the skull
through the vertex without intravenous contrast.

[Series 2: head w/o · axial · non-contrast · 0.48mm/px · z∈[-157,-12]mm · 13 of 35 slices shown, 17 images]
[im 3/35  brain]
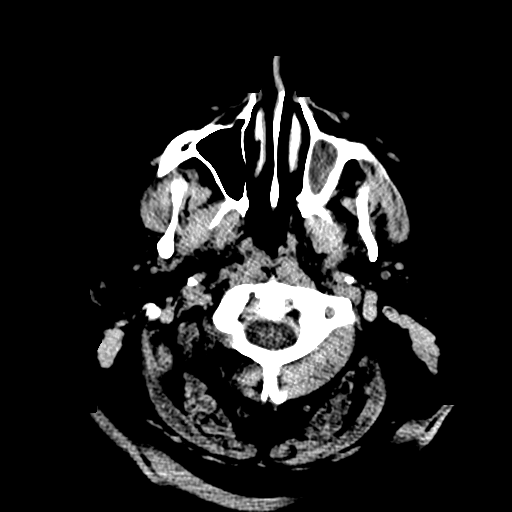
[im 3/35  bone]
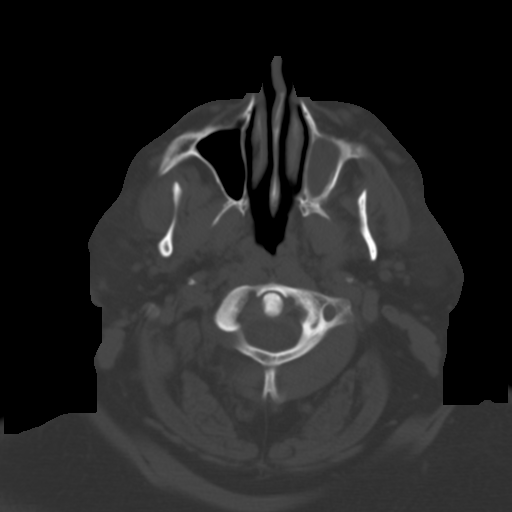
[im 5/35  brain]
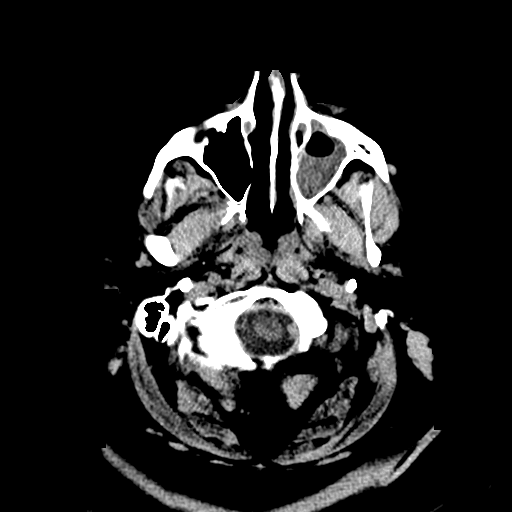
[im 8/35  brain]
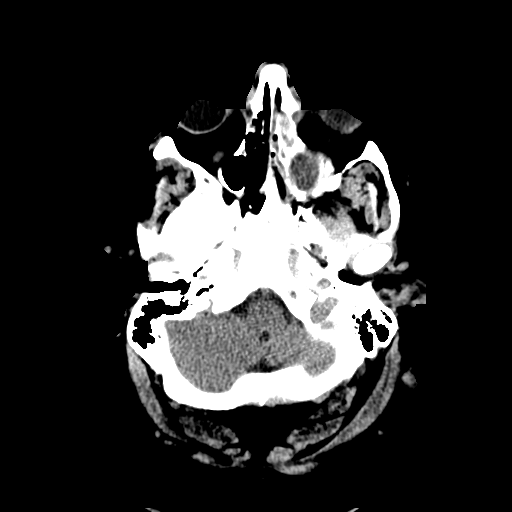
[im 10/35  brain]
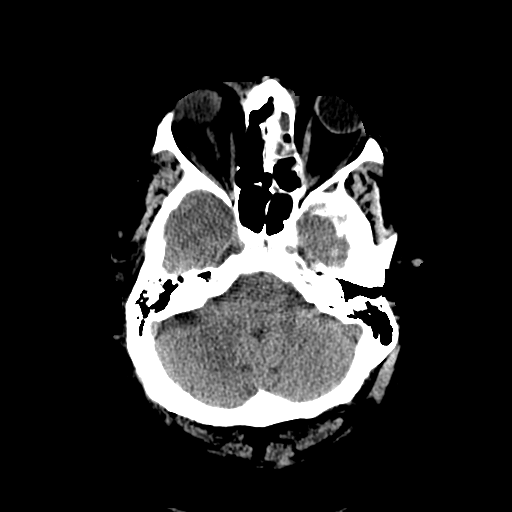
[im 13/35  brain]
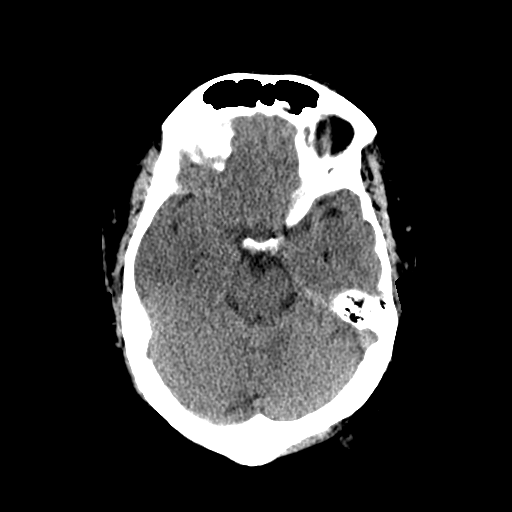
[im 13/35  bone]
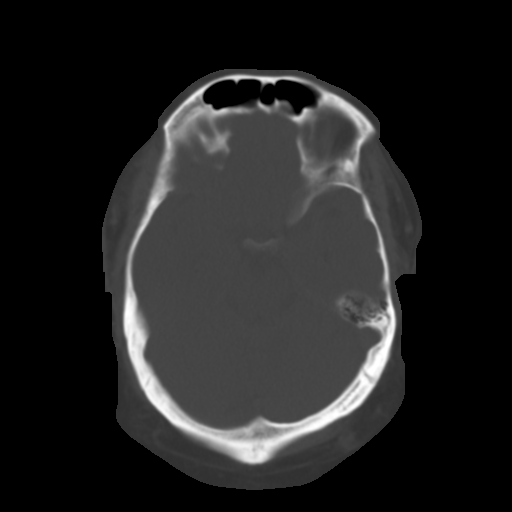
[im 15/35  brain]
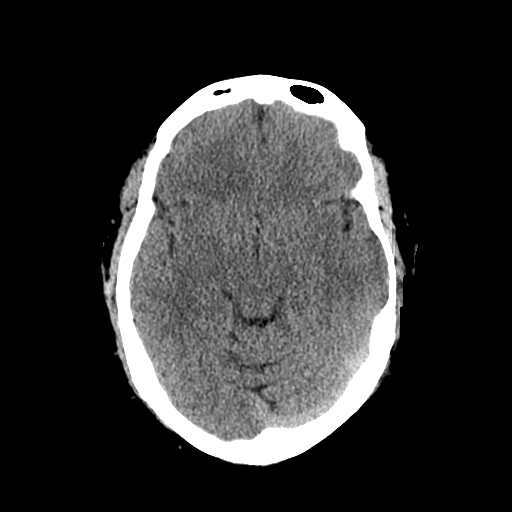
[im 18/35  brain]
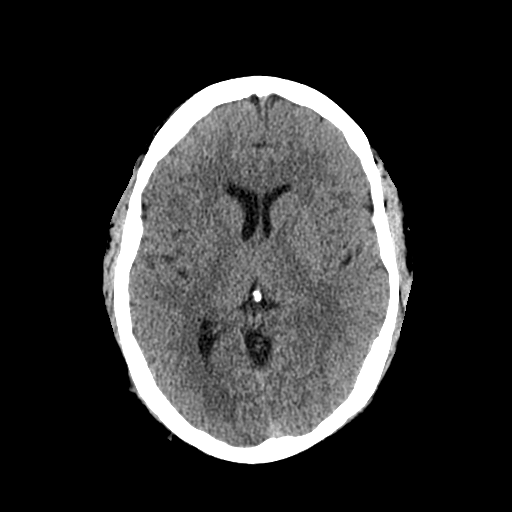
[im 20/35  brain]
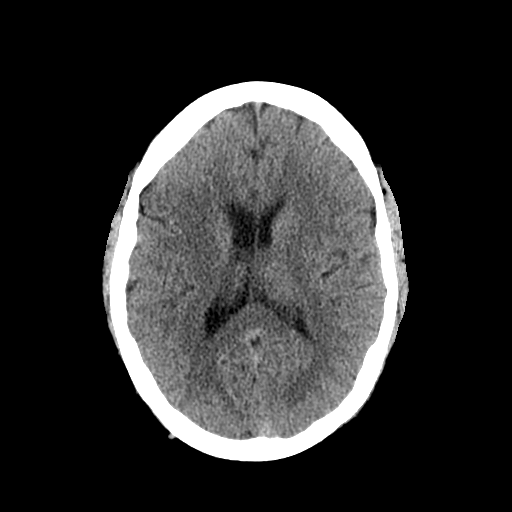
[im 22/35  brain]
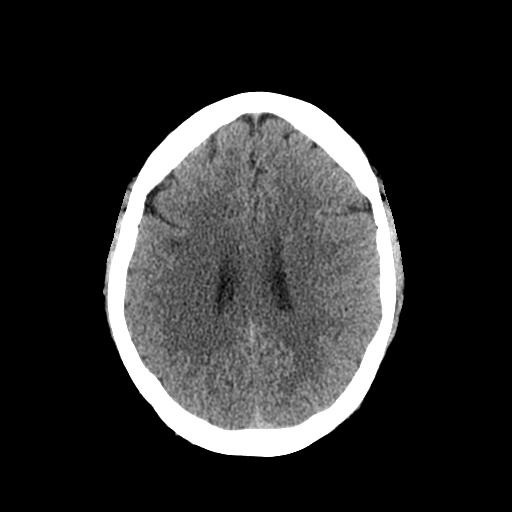
[im 22/35  bone]
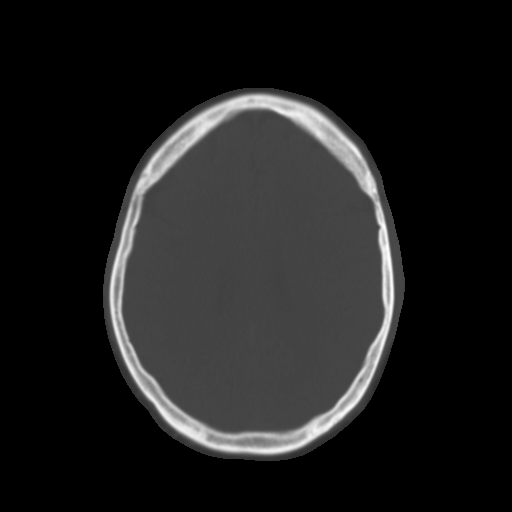
[im 25/35  brain]
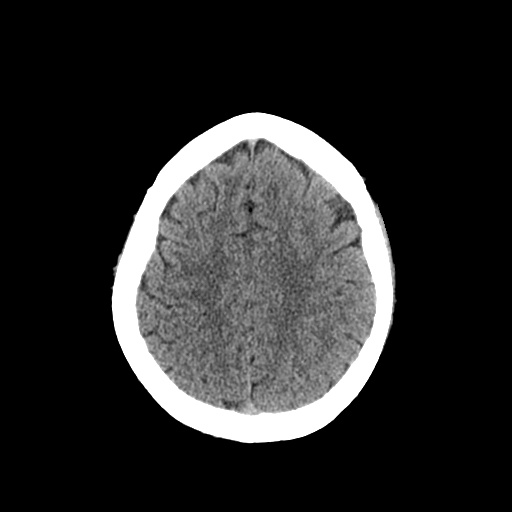
[im 27/35  brain]
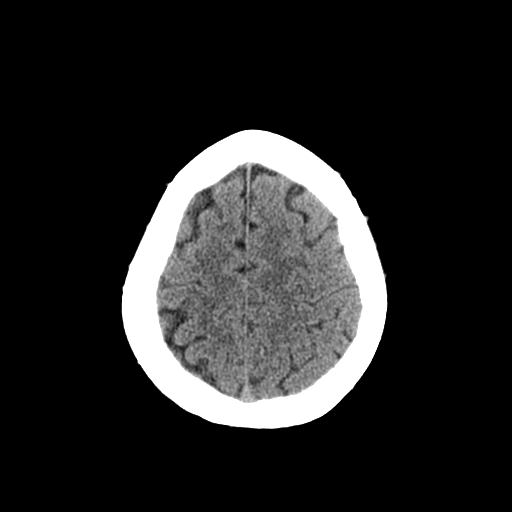
[im 30/35  brain]
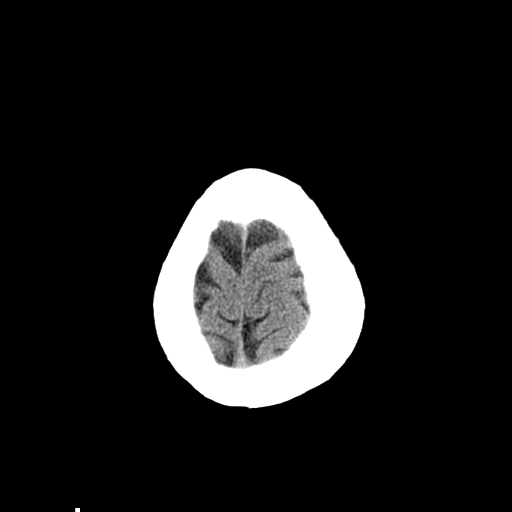
[im 32/35  brain]
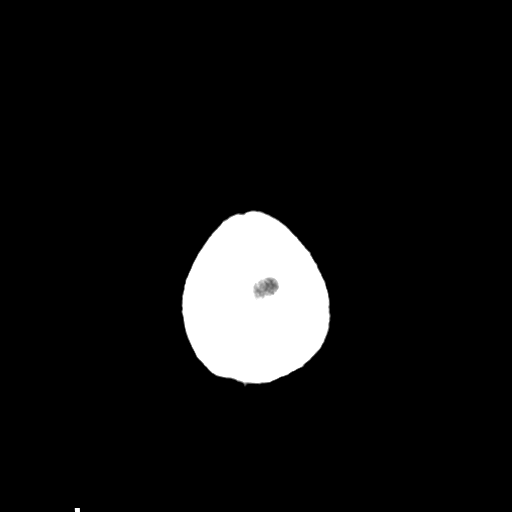
[im 32/35  bone]
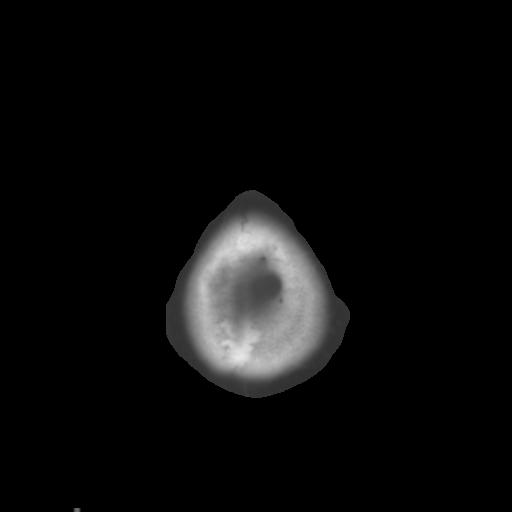

[Series 3: bone windows · axial · 0.48mm/px · z∈[-157,-132]mm · 2 of 35 slices shown]
[im 3/35  bone]
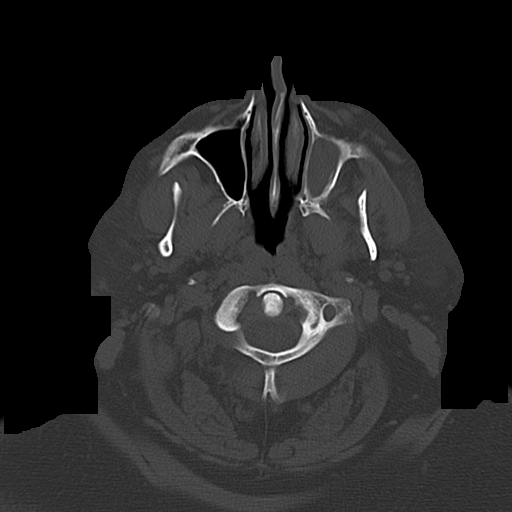
[im 8/35  bone]
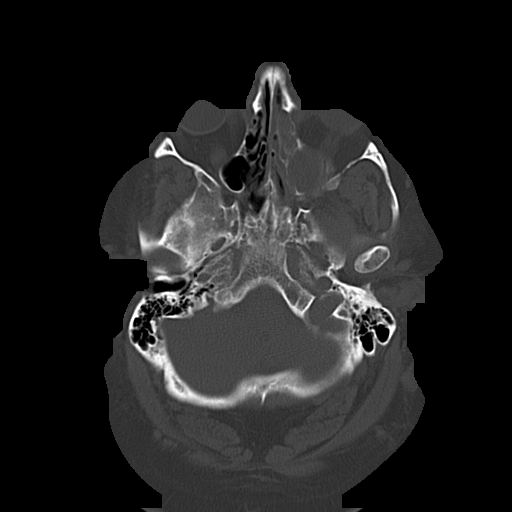

[15 of 30 positions shown; findings below may reference images not displayed]

FINDINGS: The ventricles are normal in size and position. There is no
intracranial hemorrhage nor intracranial mass effect. There is
subtle decreased density at the gray - white matter junction in the
right frontal lobe demonstrated on images 21 through 23 which likely
reflects chronic small vessel ischemic change. There is no acute
intracranial hemorrhage. The cerebellum and brainstem are
unremarkable.

There is a large amount of fluid filling the left maxillary and left
ethmoid sinus cells. There is mucoperiosteal thickening within the
left sphenoid sinus. The frontal sinuses are clear. The mastoid air
cells are well pneumatized. The calvarium is intact.
IMPRESSION: 1. Acute left maxillary, ethmoid, and sphenoid sinusitis. No bony
erosive changes are demonstrated.
2. There is no acute intracranial abnormality. Hypodensity in the
deep white matter of the right frontal lobe is consistent with
chronic small vessel ischemic change which may be related to the
patient's hypertension.

## 2016-06-16 ENCOUNTER — Emergency Department (HOSPITAL_COMMUNITY)
Admission: EM | Admit: 2016-06-16 | Discharge: 2016-06-16 | Disposition: A | Discharge status: Home / Self Care | Payer: 59 | Attending: Dermatology | Admitting: Dermatology

## 2016-06-16 ENCOUNTER — Encounter (HOSPITAL_COMMUNITY): Payer: Self-pay | Admitting: Emergency Medicine

## 2016-06-16 DIAGNOSIS — Z5321 Procedure and treatment not carried out due to patient leaving prior to being seen by health care provider: Secondary | ICD-10-CM | POA: Diagnosis not present

## 2016-06-16 DIAGNOSIS — R252 Cramp and spasm: Secondary | ICD-10-CM | POA: Insufficient documentation

## 2016-06-16 NOTE — ED Notes (Signed)
Name called to lobby with no response. 

## 2016-06-16 NOTE — ED Triage Notes (Signed)
Patient reports occasional muscle "jerking" for 3-4 years. Patient was seen by his PCP and got a "perfect report." Patient was dx with restless leg syndrome. Patient normally has involuntary muscle spasms at night, however, the patient is now having them during the day today. Patient is conscious, alert, oriented.

## 2016-06-16 NOTE — ED Notes (Signed)
Name called to lobby with no response x3. 

## 2016-06-16 NOTE — ED Notes (Addendum)
Name called to lobby with no response x2. 

## 2016-08-23 IMAGING — DX DG CHEST 2V
2 series · 2 of 2 positions shown · non-contrast
Comparison: Portable chest x-ray 04/01/2011. Two-view chest x-ray
07/11/2005.

CLINICAL DATA: Three week history of cough and intermittent
left-sided chest pain.

EXAM:
CHEST  2 VIEW

[chest pa]
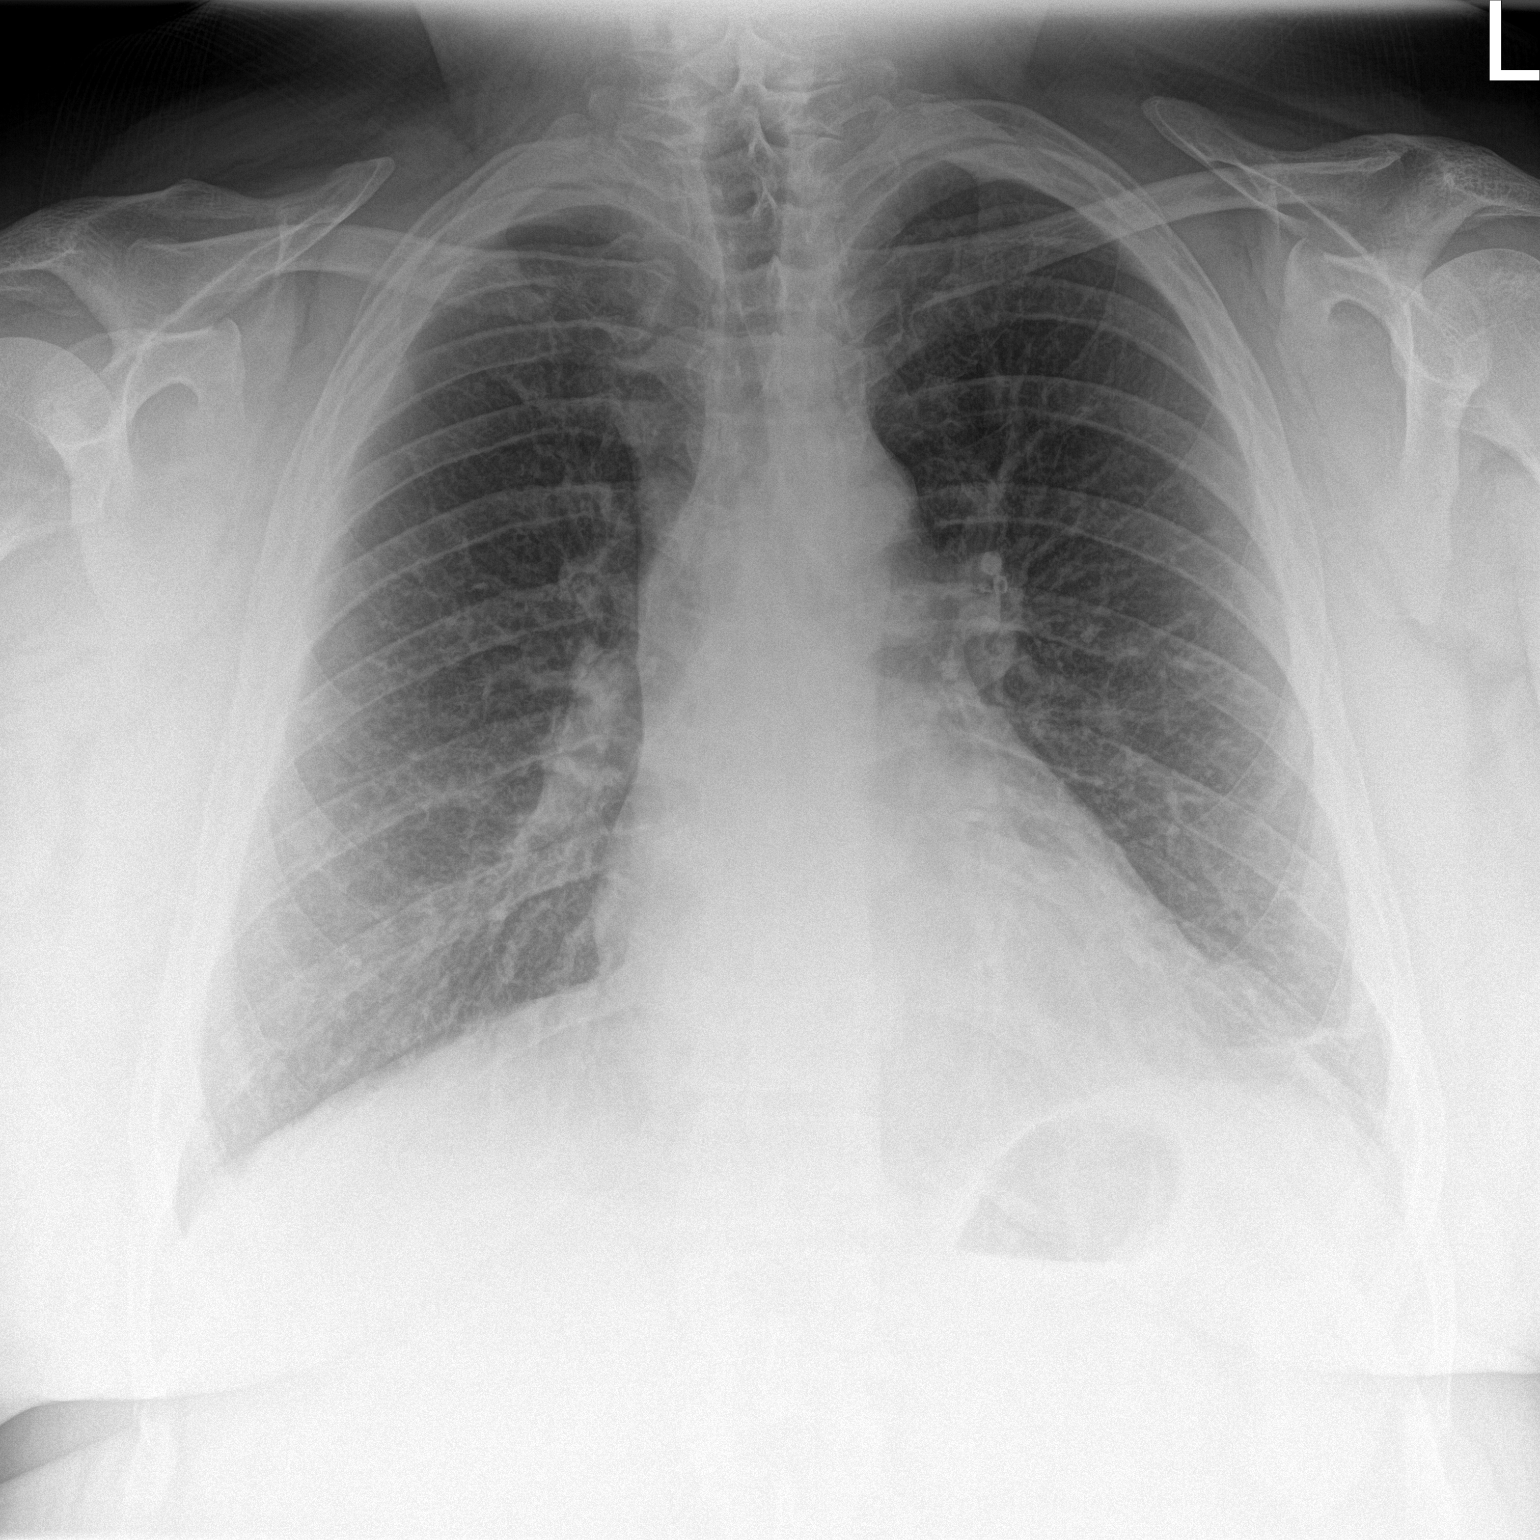

[chest lat]
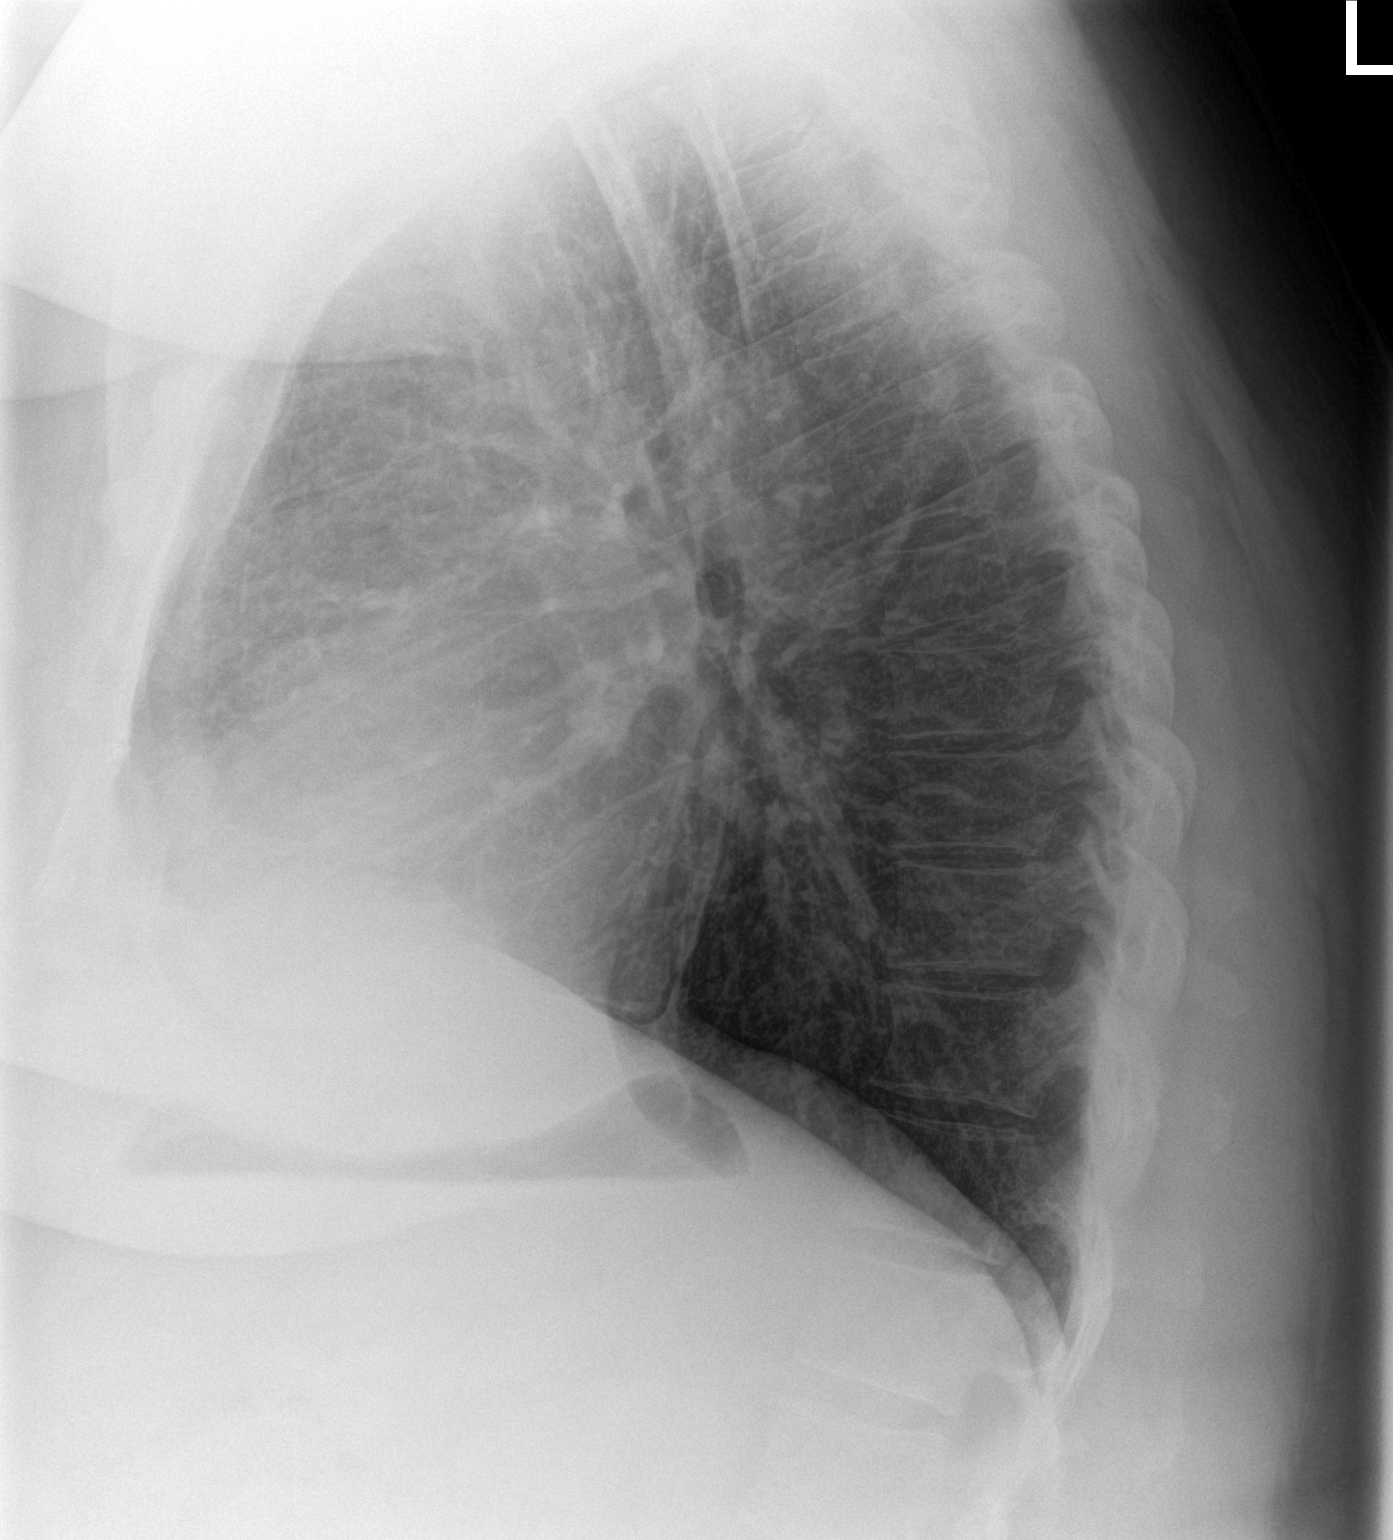

[2 of 2 positions shown; findings below may reference images not displayed]

FINDINGS: Cardiomediastinal silhouette unremarkable, unchanged. Prominent
bronchovascular markings diffusely and moderate to marked central
peribronchial thickening, more so than on the prior examinations.
Linear atelectasis or scarring in the lingula. Lungs otherwise
clear. No localized airspace consolidation. No pleural effusions. No
pneumothorax. Normal pulmonary vascularity. Visualized bony thorax
intact.
IMPRESSION: Moderate to marked changes of acute bronchitis and/or asthma without
focal airspace pneumonia. Linear atelectasis or scarring in the
lingula.

## 2016-09-06 ENCOUNTER — Emergency Department (HOSPITAL_BASED_OUTPATIENT_CLINIC_OR_DEPARTMENT_OTHER)
Admission: EM | Admit: 2016-09-06 | Discharge: 2016-09-06 | Disposition: A | Discharge status: Home / Self Care | Payer: 59 | Attending: Emergency Medicine | Admitting: Emergency Medicine

## 2016-09-06 ENCOUNTER — Emergency Department (HOSPITAL_BASED_OUTPATIENT_CLINIC_OR_DEPARTMENT_OTHER): Payer: 59

## 2016-09-06 ENCOUNTER — Encounter (HOSPITAL_BASED_OUTPATIENT_CLINIC_OR_DEPARTMENT_OTHER): Payer: Self-pay | Admitting: Emergency Medicine

## 2016-09-06 DIAGNOSIS — Z7984 Long term (current) use of oral hypoglycemic drugs: Secondary | ICD-10-CM | POA: Diagnosis not present

## 2016-09-06 DIAGNOSIS — J441 Chronic obstructive pulmonary disease with (acute) exacerbation: Secondary | ICD-10-CM | POA: Diagnosis not present

## 2016-09-06 DIAGNOSIS — F17210 Nicotine dependence, cigarettes, uncomplicated: Secondary | ICD-10-CM | POA: Insufficient documentation

## 2016-09-06 DIAGNOSIS — Z79899 Other long term (current) drug therapy: Secondary | ICD-10-CM | POA: Insufficient documentation

## 2016-09-06 DIAGNOSIS — J069 Acute upper respiratory infection, unspecified: Secondary | ICD-10-CM | POA: Diagnosis not present

## 2016-09-06 DIAGNOSIS — I10 Essential (primary) hypertension: Secondary | ICD-10-CM | POA: Diagnosis not present

## 2016-09-06 DIAGNOSIS — R05 Cough: Secondary | ICD-10-CM | POA: Diagnosis present

## 2016-09-06 DIAGNOSIS — E119 Type 2 diabetes mellitus without complications: Secondary | ICD-10-CM | POA: Diagnosis not present

## 2016-09-06 MED ORDER — DEXAMETHASONE SODIUM PHOSPHATE 10 MG/ML IJ SOLN
10.0000 mg | Freq: Once | INTRAMUSCULAR | Status: AC
  Administered 2016-09-06: 10 mg via INTRAMUSCULAR
  Filled 2016-09-06: qty 1

## 2016-09-06 MED ORDER — IPRATROPIUM-ALBUTEROL 0.5-2.5 (3) MG/3ML IN SOLN
3.0000 mL | Freq: Once | RESPIRATORY_TRACT | Status: AC
  Administered 2016-09-06: 3 mL via RESPIRATORY_TRACT
  Filled 2016-09-06: qty 3

## 2016-09-06 MED ORDER — ALBUTEROL SULFATE HFA 108 (90 BASE) MCG/ACT IN AERS
2.0000 | INHALATION_SPRAY | Freq: Once | RESPIRATORY_TRACT | Status: AC
  Administered 2016-09-06: 2 via RESPIRATORY_TRACT
  Filled 2016-09-06: qty 6.7

## 2016-09-06 MED ORDER — ALBUTEROL SULFATE (2.5 MG/3ML) 0.083% IN NEBU
2.5000 mg | INHALATION_SOLUTION | Freq: Once | RESPIRATORY_TRACT | Status: AC
  Administered 2016-09-06: 2.5 mg via RESPIRATORY_TRACT
  Filled 2016-09-06: qty 3

## 2016-09-06 MED ORDER — ALBUTEROL SULFATE (2.5 MG/3ML) 0.083% IN NEBU: 3.0000 mL | INHALATION_SOLUTION | RESPIRATORY_TRACT | 0 refills | Status: DC | PRN

## 2016-09-06 NOTE — ED Triage Notes (Signed)
Pt presents to ed with c/o cough for 3 weeks

## 2016-09-06 NOTE — ED Notes (Signed)
Pt given d/c instructions as per chart. Rx x 1. Verbalizes understanding. No questions. 

## 2016-09-06 NOTE — ED Provider Notes (Signed)
[facility] Provider Note   CSN: [id number] Arrival date & time: 09/06/16  7829  By signing my name below, I, Nelwyn Salisbury, attest that this documentation has been prepared under the direction and in the presence of Rolan Bucco, MD . Electronically Signed: Nelwyn Salisbury, Scribe. 09/06/2016. 8:46 PM.  History   Chief Complaint Chief Complaint  Patient presents with  . Cough   The history is provided by the patient. No language interpreter was used.    HPI Comments:  Raymond [name] is a 51 y.o. male with pmhx of COPD, DM, HTN and HLD who presents to the Emergency Department complaining of occasional, unchanged cough productive of sputum onset 3 weeks. He reports associated headache, sinus pressure, wheezes, congestion and diarrhea. He has tried dayquil and nyquil at home with no relief of symptoms. A breathing treatment was started in the ED which gave him some relief of symptoms. Pt is prescribed an inhaler for his symptoms but he has been out of his medications for a long time. He denies any vomiting or any other symptoms. Pt is an everyday smoker.   Past Medical History:  Diagnosis Date  . Anxiety   . COPD (chronic obstructive pulmonary disease) (HCC)   . Diabetes mellitus without complication (HCC)   . Edema   . GERD (gastroesophageal reflux disease)   . Gout   . Heavy smoker   . Hyperlipidemia   . Hypertension   . Sleep apnea    uses a cpap    Patient Active Problem List   Diagnosis Date Noted  . SOB (shortness of breath) 10/06/2014  . Edema extremities 10/06/2014  . Benign essential HTN 10/06/2014    Past Surgical History:  Procedure Laterality Date  . CHONDROPLASTY Left 06/28/2014   Procedure: CHONDROPLASTY;  Surgeon: Thera Flake., MD;  Location: [facility];  Service: Orthopedics;  Laterality: Left;  . COLONOSCOPY    . FOOT FASCIOTOMY     age 54-rt  . KNEE ARTHROSCOPY WITH LATERAL MENISECTOMY Left 06/28/2014   Procedure: KNEE  ARTHROSCOPY WITH LATERAL MENISECTOMY;  Surgeon: Thera Flake., MD;  Location: [facility];  Service: Orthopedics;  Laterality: Left;  . KNEE ARTHROSCOPY WITH MEDIAL MENISECTOMY Left 06/28/2014   Procedure: LEFT KNEE ARTHROSCOPY CHONDROPLASTY/WITH MEDIAL/LATERAL MENISECTOMIES;  Surgeon: Thera Flake., MD;  Location: [facility];  Service: Orthopedics;  Laterality: Left;  . ORIF RADIUS & ULNA FRACTURES  2007   left       Home Medications    Prior to Admission medications   Medication Sig Start Date End Date Taking? Authorizing Provider  albuterol (PROVENTIL) (2.5 MG/3ML) 0.083% nebulizer solution Inhale 3 mLs into the lungs every 4 (four) hours as needed for wheezing or shortness of breath. 09/06/16   Rolan Bucco, MD  allopurinol (ZYLOPRIM) 300 MG tablet Take 300 mg by mouth daily.    Historical Provider, MD  ALPRAZolam Prudy Feeler) 0.5 MG tablet Take 0.5 mg by mouth at bedtime as needed for anxiety.    Historical Provider, MD  amLODipine-benazepril (LOTREL) 5-20 MG per capsule Take 1 capsule by mouth 2 (two) times daily. 10/03/14   Historical Provider, MD  Aspirin-Acetaminophen-Caffeine (GOODY HEADACHE PO) Take 1 packet by mouth 2 (two) times daily as needed (pain).    Historical Provider, MD  atorvastatin (LIPITOR) 10 MG tablet Take 10 mg by mouth at bedtime.     Historical Provider, MD  carvedilol (COREG) 25 MG tablet Take 25 mg  by mouth 2 (two) times daily with a meal.    Historical Provider, MD  doxazosin (CARDURA) 2 MG tablet Take 2 mg by mouth at bedtime. 07/25/14   Historical Provider, MD  furosemide (LASIX) 80 MG tablet Take 160 mg by mouth 2 (two) times daily.     Historical Provider, MD  gabapentin (NEURONTIN) 600 MG tablet Take 600 mg by mouth at bedtime.    Historical Provider, MD  glipiZIDE (GLUCOTROL) 5 MG tablet Take 5 mg by mouth 2 (two) times daily.    Historical Provider, MD  hydrOXYzine (ATARAX/VISTARIL) 25 MG tablet Take 1 tablet (25 mg total) by  mouth every 6 (six) hours. 08/12/15   Rolland Porter, MD  lisinopril (PRINIVIL,ZESTRIL) 20 MG tablet Take 20 mg by mouth 2 (two) times daily.    Historical Provider, MD  losartan (COZAAR) 50 MG tablet Take 50 mg by mouth daily.    Historical Provider, MD  meloxicam (MOBIC) 7.5 MG tablet Take 7.5 mg by mouth daily.    Historical Provider, MD  metFORMIN (GLUCOPHAGE-XR) 500 MG 24 hr tablet Take 1,000 mg by mouth daily with breakfast.  09/27/14   Historical Provider, MD  nystatin-triamcinolone (MYCOLOG II) cream Apply 1 application topically daily as needed (itch).  08/06/15   Historical Provider, MD  omeprazole (PRILOSEC) 40 MG capsule Take 40 mg by mouth daily.    Historical Provider, MD  potassium chloride SA (K-DUR,KLOR-CON) 20 MEQ tablet Take 20 mEq by mouth 2 (two) times daily.    Historical Provider, MD  pramipexole (MIRAPEX) 0.125 MG tablet Take 0.125 mg by mouth daily. 07/27/15   Historical Provider, MD  predniSONE (DELTASONE) 20 MG tablet 2 by mouth daily 5 days, then one by mouth daily 5 days. 08/12/15   Rolland Porter, MD  traMADol (ULTRAM) 50 MG tablet Take 50 mg by mouth every 6 (six) hours as needed for moderate pain. For pain 09/26/14   Historical Provider, MD    Family History Family History  Problem Relation Age of Onset  . Diabetes Mother   . Hypertension Mother   . Diabetes Father   . Hypertension Father   . Cancer Other     Social History Social History  Substance Use Topics  . Smoking status: Current Every Day Smoker    Packs/day: 2.00    Types: Cigarettes  . Smokeless tobacco: Never Used  . Alcohol use Yes     Comment: weekends      Allergies   Sulfa antibiotics   Review of Systems Review of Systems  Constitutional: Negative for chills, diaphoresis, fatigue and fever.  HENT: Positive for congestion and sinus pressure. Negative for rhinorrhea and sneezing.   Eyes: Negative.   Respiratory: Positive for wheezing. Negative for cough, chest tightness and shortness of  breath.   Cardiovascular: Negative for chest pain and leg swelling.  Gastrointestinal: Positive for diarrhea. Negative for abdominal pain, blood in stool, nausea and vomiting.  Genitourinary: Negative for difficulty urinating, flank pain, frequency and hematuria.  Musculoskeletal: Negative for arthralgias and back pain.  Skin: Negative for rash.  Neurological: Positive for headaches. Negative for dizziness, speech difficulty, weakness and numbness.     Physical Exam Updated Vital Signs BP (!) 189/102 (BP Location: Left Arm)   Pulse 76   Temp 98.2 F (36.8 C) (Oral)   Resp 20   Ht  (1.803 m)   Wt (!) 339 lb (153.8 kg)   SpO2 100%   BMI 47.28 kg/m   Physical Exam  Constitutional: He is oriented to person, place, and time. He appears well-developed and well-nourished.  HENT:  Head: Normocephalic and atraumatic.  Right Ear: Tympanic membrane normal.  Left Ear: Tympanic membrane normal.  Eyes: Pupils are equal, round, and reactive to light.  Neck: Normal range of motion. Neck supple.  Cardiovascular: Normal rate, regular rhythm and normal heart sounds.   Pulmonary/Chest: Effort normal and breath sounds normal. No respiratory distress. He has no wheezes. He has no rales. He exhibits no tenderness.  Abdominal: Soft. Bowel sounds are normal. There is no tenderness. There is no rebound and no guarding.  Musculoskeletal: Normal range of motion. He exhibits no edema.  Trace edema in lower extremities  Lymphadenopathy:    He has no cervical adenopathy.  Neurological: He is alert and oriented to person, place, and time.  Skin: Skin is warm and dry. No rash noted.  Psychiatric: He has a normal mood and affect.     ED Treatments / Results  DIAGNOSTIC STUDIES:  Oxygen Saturation is 100% on RA, normal by my interpretation.    COORDINATION OF CARE:  8:55 PM Discussed treatment plan with pt at bedside which includes IM steroids and rescue inhaler and pt agreed to  plan.  Labs (all labs ordered are listed, but only abnormal results are displayed) Labs Reviewed - No data to display  EKG  EKG Interpretation None       Radiology Dg Chest 2 View  Result Date: 09/06/2016 CLINICAL DATA:  Cough x3 weeks EXAM: CHEST  2 VIEW COMPARISON:  08/12/2015 FINDINGS: Heart is top normal. No aortic aneurysm seen. Mild interstitial prominence is noted bilaterally which Kosar reflect chronic bronchitic change. No pneumonic consolidation, effusion or pneumothorax. Minimal scarring or atelectasis at the left costophrenic angle. IMPRESSION: Mild interstitial prominence which Ury reflect chronic bronchitic change. Minimal atelectasis and/or scarring at the left lung base. No pneumonic consolidation or CHF. Electronically Signed   By: Tollie Eth M.D.   On: 09/06/2016 19:43    Procedures Procedures (including critical care time)  Medications Ordered in ED Medications  albuterol (PROVENTIL HFA;VENTOLIN HFA) 108 (90 Base) MCG/ACT inhaler 2 puff (not administered)  dexamethasone (DECADRON) injection 10 mg (not administered)  ipratropium-albuterol (DUONEB) 0.5-2.5 (3) MG/3ML nebulizer solution 3 mL (3 mLs Nebulization Given 09/06/16 1940)  albuterol (PROVENTIL) (2.5 MG/3ML) 0.083% nebulizer solution 2.5 mg (2.5 mg Nebulization Given 09/06/16 1940)     Initial Impression / Assessment and Plan / ED Course  I have reviewed the triage vital signs and the nursing notes.  Pertinent labs & imaging results that were available during my care of the patient were reviewed by me and considered in my medical decision making (see chart for details).     Patient presents with worsening cough and wheezing. He has yellow-green sputum production but he says it's draining from the sinuses rather than coming from his lungs. He states he's had multiple sinus infections in the past and typically responds to a shot of steroids. He denies any fevers. There is no evidence of pneumonia. He was given a  nebulizer treatment in the ED and is feeling much better. On my exam his lungs are clear. He has no hypoxia. No increased work of breathing. He was given a shot of Decadron in the ED. He was dispensed an albuterol MDI as well as given a prescription for a refill on his nebulizer solution. He was encouraged to follow-up with his PCP. Return precautions were given.  Final Clinical Impressions(s) / ED  Diagnoses   Final diagnoses:  COPD exacerbation (HCC)  Viral upper respiratory tract infection    New Prescriptions Current Discharge Medication List    I personally performed the services described in this documentation, which was scribed in my presence.  The recorded information has been reviewed and considered.      Rolan Bucco, MD 09/06/16 2118

## 2016-09-06 NOTE — Progress Notes (Signed)
Patient states that he is out of his inhaler.

## 2017-03-11 DIAGNOSIS — F1911 Other psychoactive substance abuse, in remission: Secondary | ICD-10-CM | POA: Insufficient documentation

## 2017-03-11 DIAGNOSIS — N401 Enlarged prostate with lower urinary tract symptoms: Secondary | ICD-10-CM | POA: Insufficient documentation

## 2017-03-11 DIAGNOSIS — G2581 Restless legs syndrome: Secondary | ICD-10-CM | POA: Insufficient documentation

## 2017-07-16 ENCOUNTER — Encounter (INDEPENDENT_AMBULATORY_CARE_PROVIDER_SITE_OTHER): Payer: 59

## 2017-07-30 ENCOUNTER — Ambulatory Visit (INDEPENDENT_AMBULATORY_CARE_PROVIDER_SITE_OTHER): Payer: 59 | Admitting: Family Medicine

## 2018-03-18 DIAGNOSIS — Z720 Tobacco use: Secondary | ICD-10-CM | POA: Insufficient documentation

## 2021-06-01 ENCOUNTER — Emergency Department (HOSPITAL_COMMUNITY): Payer: Commercial Managed Care - PPO

## 2021-06-01 ENCOUNTER — Encounter (HOSPITAL_COMMUNITY): Payer: Self-pay | Admitting: Emergency Medicine

## 2021-06-01 ENCOUNTER — Emergency Department (HOSPITAL_COMMUNITY)
Admission: EM | Admit: 2021-06-01 | Discharge: 2021-06-01 | Disposition: A | Discharge status: Home / Self Care | Payer: Commercial Managed Care - PPO | Attending: Emergency Medicine | Admitting: Emergency Medicine

## 2021-06-01 ENCOUNTER — Other Ambulatory Visit: Payer: Self-pay

## 2021-06-01 DIAGNOSIS — U071 COVID-19: Secondary | ICD-10-CM | POA: Insufficient documentation

## 2021-06-01 DIAGNOSIS — Z5321 Procedure and treatment not carried out due to patient leaving prior to being seen by health care provider: Secondary | ICD-10-CM | POA: Diagnosis not present

## 2021-06-01 DIAGNOSIS — R0602 Shortness of breath: Secondary | ICD-10-CM | POA: Diagnosis present

## 2021-06-01 LAB — BASIC METABOLIC PANEL
Anion gap: 11 (ref 5–15)
BUN: 20 mg/dL (ref 6–20)
CO2: 23 mmol/L (ref 22–32)
Calcium: 8.5 mg/dL — ABNORMAL LOW (ref 8.9–10.3)
Chloride: 101 mmol/L (ref 98–111)
Creatinine, Ser: 1.17 mg/dL (ref 0.61–1.24)
GFR, Estimated: >60 mL/min (ref >60)
Glucose, Bld: 237 mg/dL — ABNORMAL HIGH (ref 70–99)
Potassium: 4.4 mmol/L (ref 3.5–5.1)
Sodium: 135 mmol/L (ref 135–145)

## 2021-06-01 LAB — BRAIN NATRIURETIC PEPTIDE: B Natriuretic Peptide: 58.4 pg/mL (ref 0.0–100.0)

## 2021-06-01 LAB — RESP PANEL BY RT-PCR (FLU A&B, COVID) ARPGX2
Influenza A by PCR: NEGATIVE
Influenza B by PCR: NEGATIVE
SARS Coronavirus 2 by RT PCR: POSITIVE — AB

## 2021-06-01 LAB — CBG MONITORING, ED: Glucose-Capillary: 247 mg/dL — ABNORMAL HIGH (ref 70–99)

## 2021-06-01 LAB — LACTIC ACID, PLASMA: Lactic Acid, Venous: 1.7 mmol/L (ref 0.5–1.9)

## 2021-06-01 NOTE — ED Triage Notes (Signed)
Pt was sent from UC w/ c/o SHOB, body aches, foot pain, pneumonia.  Pt reports having covid on christmas eve and has been worsening since. Was on a steroid and it made CBG high so stopped taking it. Pt reports "covid toes."  Hx diabetes.

## 2021-06-01 NOTE — ED Notes (Addendum)
CBG 247 

## 2021-06-01 NOTE — ED Provider Notes (Signed)
Emergency Medicine Provider Triage Evaluation Note  Raymond [name] , a 55 y.o. male  was evaluated in triage.  Pt complains of SOB.  Review of Systems  Positive: Cough, sob, foot/toes pain Negative: Fever, chills, abd pain  Physical Exam  BP (!) 162/92    Pulse (!) 112    Temp 98.1 F (36.7 C) (Oral)    Resp 18    SpO2 95%  Gen:   Awake, no distress   Resp:  Normal effort  MSK:   Moves extremities without difficulty  Other:    Medical Decision Making  Medically screening exam initiated at 10:49 AM.  Appropriate orders placed.  Raymond [name] was informed that the remainder of the evaluation will be completed by another provider, this initial triage assessment does not replace that evaluation, and the importance of remaining in the ED until their evaluation is complete.  Pt report being diagnosed with COVID a week ago, was placed on steroid.  However, endorse increase SOB, also having pain to several of his toes on both feet that was thought to be covid-toes.  Seen at [facility] today and had CXR which shows pna.  Sent here for further care. Was on steroid but it spiked his CBG.    Fayrene Helper, PA-C 06/01/21 1055    Lorre Nick, MD 06/02/21 [phone]

## 2021-07-09 ENCOUNTER — Emergency Department (HOSPITAL_BASED_OUTPATIENT_CLINIC_OR_DEPARTMENT_OTHER)
Admission: EM | Admit: 2021-07-09 | Discharge: 2021-07-09 | Disposition: A | Discharge status: Home / Self Care | Payer: Commercial Managed Care - PPO | Attending: Emergency Medicine | Admitting: Emergency Medicine

## 2021-07-09 ENCOUNTER — Emergency Department (HOSPITAL_BASED_OUTPATIENT_CLINIC_OR_DEPARTMENT_OTHER): Payer: Commercial Managed Care - PPO

## 2021-07-09 ENCOUNTER — Encounter (HOSPITAL_BASED_OUTPATIENT_CLINIC_OR_DEPARTMENT_OTHER): Payer: Self-pay | Admitting: *Deleted

## 2021-07-09 ENCOUNTER — Ambulatory Visit: Payer: Self-pay

## 2021-07-09 ENCOUNTER — Other Ambulatory Visit: Payer: Self-pay

## 2021-07-09 DIAGNOSIS — R609 Edema, unspecified: Secondary | ICD-10-CM

## 2021-07-09 DIAGNOSIS — F17200 Nicotine dependence, unspecified, uncomplicated: Secondary | ICD-10-CM | POA: Insufficient documentation

## 2021-07-09 DIAGNOSIS — Z79899 Other long term (current) drug therapy: Secondary | ICD-10-CM | POA: Insufficient documentation

## 2021-07-09 DIAGNOSIS — R600 Localized edema: Secondary | ICD-10-CM | POA: Diagnosis not present

## 2021-07-09 DIAGNOSIS — M79669 Pain in unspecified lower leg: Secondary | ICD-10-CM

## 2021-07-09 DIAGNOSIS — R0602 Shortness of breath: Secondary | ICD-10-CM | POA: Insufficient documentation

## 2021-07-09 DIAGNOSIS — I10 Essential (primary) hypertension: Secondary | ICD-10-CM | POA: Diagnosis not present

## 2021-07-09 DIAGNOSIS — Z20822 Contact with and (suspected) exposure to covid-19: Secondary | ICD-10-CM | POA: Insufficient documentation

## 2021-07-09 DIAGNOSIS — J449 Chronic obstructive pulmonary disease, unspecified: Secondary | ICD-10-CM | POA: Insufficient documentation

## 2021-07-09 DIAGNOSIS — Z7982 Long term (current) use of aspirin: Secondary | ICD-10-CM | POA: Diagnosis not present

## 2021-07-09 LAB — BASIC METABOLIC PANEL
Anion gap: 12 (ref 5–15)
BUN: 26 mg/dL — ABNORMAL HIGH (ref 6–20)
CO2: 28 mmol/L (ref 22–32)
Calcium: 8.9 mg/dL (ref 8.9–10.3)
Chloride: 98 mmol/L (ref 98–111)
Creatinine, Ser: 1.48 mg/dL — ABNORMAL HIGH (ref 0.61–1.24)
GFR, Estimated: 55 mL/min — ABNORMAL LOW (ref >60)
Glucose, Bld: 122 mg/dL — ABNORMAL HIGH (ref 70–99)
Potassium: 4 mmol/L (ref 3.5–5.1)
Sodium: 138 mmol/L (ref 135–145)

## 2021-07-09 LAB — BRAIN NATRIURETIC PEPTIDE: B Natriuretic Peptide: 85.5 pg/mL (ref 0.0–100.0)

## 2021-07-09 LAB — CBC WITH DIFFERENTIAL/PLATELET
Abs Immature Granulocytes: 0.06 10*3/uL (ref 0.00–0.07)
Basophils Absolute: 0.1 10*3/uL (ref 0.0–0.1)
Basophils Relative: 1 %
Eosinophils Absolute: 0.4 10*3/uL (ref 0.0–0.5)
Eosinophils Relative: 4 %
HCT: 41.3 % (ref 39.0–52.0)
Hemoglobin: 13.5 g/dL (ref 13.0–17.0)
Immature Granulocytes: 1 %
Lymphocytes Relative: 20 %
Lymphs Abs: 2.2 10*3/uL (ref 0.7–4.0)
MCH: 29.5 pg (ref 26.0–34.0)
MCHC: 32.7 g/dL (ref 30.0–36.0)
MCV: 90.2 fL (ref 80.0–100.0)
Monocytes Absolute: 0.8 10*3/uL (ref 0.1–1.0)
Monocytes Relative: 7 %
Neutro Abs: 7.6 10*3/uL (ref 1.7–7.7)
Neutrophils Relative %: 67 %
Platelets: 299 10*3/uL (ref 150–400)
RBC: 4.58 MIL/uL (ref 4.22–5.81)
RDW: 15.4 % (ref 11.5–15.5)
WBC: 11.2 10*3/uL — ABNORMAL HIGH (ref 4.0–10.5)
nRBC: 0 % (ref 0.0–0.2)

## 2021-07-09 LAB — RESP PANEL BY RT-PCR (FLU A&B, COVID) ARPGX2
Influenza A by PCR: NEGATIVE
Influenza B by PCR: NEGATIVE
SARS Coronavirus 2 by RT PCR: NEGATIVE

## 2021-07-09 LAB — PROTIME-INR
INR: 1 (ref 0.8–1.2)
Prothrombin Time: 12.8 seconds (ref 11.4–15.2)

## 2021-07-09 LAB — OCCULT BLOOD X 1 CARD TO LAB, STOOL: Fecal Occult Bld: NEGATIVE

## 2021-07-09 MED ORDER — IOHEXOL 350 MG/ML SOLN
100.0000 mL | Freq: Once | INTRAVENOUS | Status: AC | PRN
  Administered 2021-07-09: 100 mL via INTRAVENOUS

## 2021-07-09 NOTE — ED Notes (Signed)
Per RN Shaun pt can have ice water. Pt given the same and family requesting coke. Family given the same.

## 2021-07-09 NOTE — Discharge Instructions (Addendum)
You were seen in the emergency department for worsening leg pain and swelling along with increased shortness of breath.  You had lab work EKG chest x-ray and a CAT scan of your chest that did not show any serious findings.  No evidence of blood clot in your legs or chest.  Your renal function is slightly elevated at 1.48 and will need to be followed.  Please continue your diuretics.  Low-salt diet.  Leg elevation.  Follow-up with your primary care doctor.  Return to the emergency department if any worsening or concerning symptoms.

## 2021-07-09 NOTE — Telephone Encounter (Signed)
°  Chief Complaint: SOB Symptoms: SOB, heart flutters, swelling, black tarry stools, O2 95% Frequency: 2 weeks Pertinent Negatives: NA Disposition: [x] ED /[] Urgent Care (no appt availability in office) / [] Appointment(In office/virtual)/ []  [facility]/ [] Home Care/ [] Refused Recommended Disposition /[]  Mobile Bus/ []  Follow-up with PCP Additional Notes: Pt talked to PCP and was advised to go to ED last week for possible blood clot but pt refused and then called about black tarry stools and was advised by PCP Dado have bleeding ulcer. Advised pt with symptoms presenting to go to ED to be evaluated.    Reason for Disposition  [1] MILD difficulty breathing (e.g., minimal/no SOB at rest, SOB with walking, pulse <100) AND [2] NEW-onset or WORSE than normal  Answer Assessment - Initial Assessment Questions 1. RESPIRATORY STATUS: "Describe your breathing?" (e.g., wheezing, shortness of breath, unable to speak, severe coughing)      SOB 2. ONSET: "When did this breathing problem begin?"      2 weeks 3. PATTERN "Does the difficult breathing come and go, or has it been constant since it started?"      Comes and goes 4. SEVERITY: "How bad is your breathing?" (e.g., mild, moderate, severe)    - MILD: No SOB at rest, mild SOB with walking, speaks normally in sentences, can lie down, no retractions, pulse < 100.    - MODERATE: SOB at rest, SOB with minimal exertion and prefers to sit, cannot lie down flat, speaks in phrases, mild retractions, audible wheezing, pulse 100-120.    - SEVERE: Very SOB at rest, speaks in single words, struggling to breathe, sitting hunched forward, retractions, pulse > 120      moderate 6. CARDIAC HISTORY: "Do you have any history of heart disease?" (e.g., heart attack, angina, bypass surgery, angioplasty)      HTN DM 7. LUNG HISTORY: "Do you have any history of lung disease?"  (e.g., pulmonary embolus, asthma, emphysema)     Not yet 9. OTHER SYMPTOMS:  "Do you have any other symptoms? (e.g., dizziness, runny nose, cough, chest pain, fever)     Heart flutters 10. O2 SATURATION MONITOR:  "Do you use an oxygen saturation monitor (pulse oximeter) at home?" If Yes, "What is your reading (oxygen level) today?" "What is your usual oxygen saturation reading?" (e.g., 95%)       95  Protocols used: Breathing Difficulty-A-AH

## 2021-07-09 NOTE — ED Provider Notes (Signed)
TubesPatient briefly assessed in ER waiting room.  He has had bilateral leg swelling for the past 2 months seems that he is having worse swelling in his left leg and some pain in his calf.  Will obtain ultrasound   Gailen Shelter, [place] 07/09/21 1716    Terrilee Files, MD 07/10/21 [phone]

## 2021-07-09 NOTE — ED Notes (Signed)
Iv placement unsuccessful, attempted ultrasound iv, attempt unsuccessful, IV will not thread, blood obtained during unsuccessful attempt.

## 2021-07-09 NOTE — ED Triage Notes (Signed)
Swelling to his left leg since having Covid in December.  He broke his foots and wears a wooden shoe.  Edema per wife. His MD told him 4 days ago he has blood in his stools but did not give him a plan.

## 2021-07-09 NOTE — ED Provider Notes (Signed)
[facility] EMERGENCY DEPARTMENT Provider Note   CSN: [id number] Arrival date & time: 07/09/21  1634     History  Chief Complaint  Patient presents with   Leg Swelling    Raymond [name] is a 56 y.o. male.  He has a history of COPD and continues to smoke.  He is also had peripheral edema and is on Lasix.  He said he caught COVID at the end of December and is slowly getting better.  For the last 4 to 5 days he has had worsening shortness of breath.  Cough productive of clear sputum.  He is also noticed some increased swelling in his legs and pain in his feet when walking.[name]  He saw his PCP about 5 days ago and they found his blood count to be low.  They recommended him to get an ultrasound which he declined.  Since then he has been watching his stools and noticed some dark stool.  Doctor told him to stop using aspirin and Goody powders.  No chest pain or abdominal pain.  No fevers or chills.  The history is provided by the patient and the spouse.  Shortness of Breath Severity:  Moderate Onset quality:  Gradual Duration:  5 days Timing:  Constant Progression:  Unchanged Chronicity:  Recurrent Relieved by:  Nothing Worsened by:  Activity Ineffective treatments:  Rest Associated symptoms: cough and sputum production   Associated symptoms: no abdominal pain, no chest pain, no fever, no headaches, no hemoptysis, no sore throat and no vomiting   Risk factors: obesity and tobacco use       Home Medications Prior to Admission medications   Medication Sig Start Date End Date Taking? Authorizing Provider  albuterol (PROVENTIL) (2.5 MG/3ML) 0.083% nebulizer solution Inhale 3 mLs into the lungs every 4 (four) hours as needed for wheezing or shortness of breath. 09/06/16   Malvin Johns, MD  allopurinol (ZYLOPRIM) 300 MG tablet Take 300 mg by mouth daily.    [provider]  ALPRAZolam Duanne Moron) 0.5 MG tablet Take 0.5 mg by mouth at bedtime as needed for anxiety.    [provider]  amLODipine-benazepril (LOTREL) 5-20 MG per capsule Take 1 capsule by mouth 2 (two) times daily. 10/03/14   [provider]  Aspirin-Acetaminophen-Caffeine (GOODY HEADACHE PO) Take 1 packet by mouth 2 (two) times daily as needed (pain).    [provider]  atorvastatin (LIPITOR) 10 MG tablet Take 10 mg by mouth at bedtime.     [provider]  carvedilol (COREG) 25 MG tablet Take 25 mg by mouth 2 (two) times daily with a meal.    [provider]  doxazosin (CARDURA) 2 MG tablet Take 2 mg by mouth at bedtime. 07/25/14   [provider]  furosemide (LASIX) 80 MG tablet Take 160 mg by mouth 2 (two) times daily.     [provider]  gabapentin (NEURONTIN) 600 MG tablet Take 600 mg by mouth at bedtime.    [provider]  glipiZIDE (GLUCOTROL) 5 MG tablet Take 5 mg by mouth 2 (two) times daily.    [provider]  hydrOXYzine (ATARAX/VISTARIL) 25 MG tablet Take 1 tablet (25 mg total) by mouth every 6 (six) hours. 08/12/15   Tanna Furry, MD  lisinopril (PRINIVIL,ZESTRIL) 20 MG tablet Take 20 mg by mouth 2 (two) times daily.    [provider]  losartan (COZAAR) 50 MG tablet Take 50 mg by mouth daily.    [provider]  meloxicam (MOBIC) 7.5 MG tablet Take 7.5 mg by mouth daily.    [provider]  metFORMIN (GLUCOPHAGE-XR) 500 MG 24 hr tablet Take 1,000 mg by mouth daily with breakfast.  09/27/14   [provider]  nystatin-triamcinolone (MYCOLOG II) cream Apply 1 application topically daily as needed (itch).  08/06/15   [provider]  omeprazole (PRILOSEC) 40 MG capsule Take 40 mg by mouth daily.    [provider]  potassium chloride SA (K-DUR,KLOR-CON) 20 MEQ tablet Take 20 mEq by mouth 2 (two) times daily.    [provider]  pramipexole (MIRAPEX) 0.125 MG tablet Take 0.125 mg by mouth daily. 07/27/15   [provider]  predniSONE (DELTASONE) 20 MG  tablet 2 by mouth daily 5 days, then one by mouth daily 5 days. 08/12/15   Tanna Furry, MD  traMADol (ULTRAM) 50 MG tablet Take 50 mg by mouth every 6 (six) hours as needed for moderate pain. For pain 09/26/14   [provider]      Allergies    Sulfa antibiotics    Review of Systems   Review of Systems  Constitutional:  Negative for fever.  HENT:  Negative for sore throat.   Eyes:  Negative for visual disturbance.  Respiratory:  Positive for cough, sputum production and shortness of breath. Negative for hemoptysis.   Cardiovascular:  Positive for leg swelling. Negative for chest pain.  Gastrointestinal:  Negative for abdominal pain and vomiting.  Genitourinary:  Negative for dysuria.  Musculoskeletal:  Positive for gait problem.  Neurological:  Negative for headaches.   Physical Exam Updated Vital Signs BP (!) 227/105 (BP Location: Right Arm)    Pulse (!) 108    Temp 98.6 F (37 C) (Oral)    Resp 20    Ht 5\' 11"  (1.803 m)    Wt (!) 181.4 kg    SpO2 97%    BMI 55.79 kg/m  Physical Exam Vitals and nursing note reviewed.  Constitutional:      General: He is not in acute distress.    Appearance: Normal appearance. He is well-developed. He is obese.  HENT:     Head: Normocephalic and atraumatic.  Eyes:     Conjunctiva/sclera: Conjunctivae normal.  Cardiovascular:     Rate and Rhythm: Normal rate and regular rhythm.     Heart sounds: No murmur heard. Pulmonary:     Effort: Accessory muscle usage present. No respiratory distress.     Breath sounds: Wheezing (scattered) present.  Abdominal:     Palpations: Abdomen is soft.     Tenderness: There is no abdominal tenderness. There is no guarding or rebound.  Musculoskeletal:        General: No tenderness.     Cervical back: Neck supple.     Right lower leg: Edema present.     Left lower leg: Edema present.  Skin:    General: Skin is warm and dry.     Capillary Refill: Capillary refill takes less than 2 seconds.   Neurological:     General: No focal deficit present.     Mental Status: He is alert.    ED Results / Procedures / Treatments   Labs (all labs ordered are listed, but only abnormal results are displayed) Labs Reviewed  BASIC METABOLIC PANEL - Abnormal; Notable for the following components:      Result Value   Glucose, Bld 122 (*)    BUN 26 (*)    Creatinine, Ser  1.48 (*)    GFR, Estimated 55 (*)    All other components within normal limits  CBC WITH DIFFERENTIAL/PLATELET - Abnormal; Notable for the following components:   WBC 11.2 (*)    All other components within normal limits  RESP PANEL BY RT-PCR (FLU A&B, COVID) ARPGX2  BRAIN NATRIURETIC PEPTIDE  OCCULT BLOOD X 1 CARD TO LAB, STOOL  PROTIME-INR    EKG EKG Interpretation  Date/Time:  Tuesday July 09 2021 17:33:53 EST Ventricular Rate:  95 PR Interval:  156 QRS Duration: 94 QT Interval:  362 QTC Calculation: 454 R Axis:   74 Text Interpretation: Normal sinus rhythm with sinus arrhythmia Low voltage QRS Cannot rule out Anterior infarct , age undetermined Abnormal ECG When compared with ECG of 01-Jun-2021 10:47, No significant change since last tracing Confirmed by Aletta Edouard [phone]) on 07/09/2021 5:37:38 PM  Radiology CT Angio Chest PE W/Cm &/Or Wo Cm  Result Date: 07/09/2021 CLINICAL DATA:  Bilateral leg swelling EXAM: CT ANGIOGRAPHY CHEST WITH CONTRAST TECHNIQUE: Multidetector CT imaging of the chest was performed using the standard protocol during bolus administration of intravenous contrast. Multiplanar CT image reconstructions and MIPs were obtained to evaluate the vascular anatomy. RADIATION DOSE REDUCTION: This exam was performed according to the departmental dose-optimization program which includes automated exposure control, adjustment of the mA and/or kV according to patient size and/or use of iterative reconstruction technique. CONTRAST:  118mL OMNIPAQUE IOHEXOL 350 MG/ML SOLN COMPARISON:  Chest x-ray  07/09/2021, CT chest 10/06/2014 FINDINGS: Cardiovascular: Suboptimal opacification. Limited by habitus. No definite acute pulmonary embolus is seen. Nonaneurysmal aorta. Mild cardiomegaly. No pericardial effusion Mediastinum/Nodes: No enlarged mediastinal, hilar, or axillary lymph nodes. Thyroid gland, trachea, and esophagus demonstrate no significant findings. Lungs/Pleura: Lungs are clear. No pleural effusion or pneumothorax. Upper Abdomen: No acute abnormality. Musculoskeletal: No chest wall abnormality. No acute or significant osseous findings. Review of the MIP images confirms the above findings. IMPRESSION: 1. Slightly suboptimal study secondary to habitus and limited contrast bolus. No definite acute pulmonary embolus is seen. 2. Cardiomegaly 3. Clear lung fields Electronically Signed   By: Donavan Foil M.D.   On: 07/09/2021 20:33   US Venous Img Lower Bilateral (DVT)  Result Date: 07/09/2021 CLINICAL DATA:  Bilateral leg swell EXAM: BILATERAL LOWER EXTREMITY VENOUS DOPPLER ULTRASOUND TECHNIQUE: Gray-scale sonography with graded compression, as well as color Doppler and duplex ultrasound were performed to evaluate the lower extremity deep venous systems from the level of the common femoral vein and including the common femoral, femoral, profunda femoral, popliteal and calf veins including the posterior tibial, peroneal and gastrocnemius veins when visible. The superficial great saphenous vein was also interrogated. Spectral Doppler was utilized to evaluate flow at rest and with distal augmentation maneuvers in the common femoral, femoral and popliteal veins. COMPARISON:  None. FINDINGS: RIGHT LOWER EXTREMITY Common Femoral Vein: No evidence of thrombus. Normal compressibility, respiratory phasicity and response to augmentation. Saphenofemoral Junction: No evidence of thrombus. Normal compressibility and flow on color Doppler imaging. Profunda Femoral Vein: No evidence of thrombus. Normal compressibility  and flow on color Doppler imaging. Femoral Vein: No evidence of thrombus. Normal compressibility, respiratory phasicity and response to augmentation. Popliteal Vein: No evidence of thrombus. Normal compressibility, respiratory phasicity and response to augmentation. Calf Veins: No evidence of thrombus. Normal compressibility and flow on color Doppler imaging. Superficial Great Saphenous Vein: No evidence of thrombus. Normal compressibility. Venous Reflux:  None. Other Findings:  None. LEFT LOWER EXTREMITY Common Femoral Vein: No evidence of thrombus.  Normal compressibility, respiratory phasicity and response to augmentation. Saphenofemoral Junction: No evidence of thrombus. Normal compressibility and flow on color Doppler imaging. Profunda Femoral Vein: No evidence of thrombus. Normal compressibility and flow on color Doppler imaging. Femoral Vein: No evidence of thrombus. Normal compressibility, respiratory phasicity and response to augmentation. Popliteal Vein: No evidence of thrombus. Normal compressibility, respiratory phasicity and response to augmentation. Calf Veins: No evidence of thrombus. Normal compressibility and flow on color Doppler imaging. Superficial Great Saphenous Vein: No evidence of thrombus. Normal compressibility. Venous Reflux:  None. Other Findings:  None. IMPRESSION: No evidence of deep venous thrombosis in either lower extremity. Electronically Signed   By: Franchot Gallo M.D.   On: 07/09/2021 19:43   DG Chest Port 1 View  Result Date: 07/09/2021 CLINICAL DATA:  Shortness of breath, left leg swelling. EXAM: PORTABLE CHEST 1 VIEW COMPARISON:  June 01, 2021 FINDINGS: The heart size and mediastinal contours are within normal limits. No focal consolidation. No visible pleural effusion or pneumothorax. The visualized skeletal structures are unremarkable. IMPRESSION: No acute cardiopulmonary process. Electronically Signed   By: Dahlia Bailiff M.D.   On: 07/09/2021 18:35     Procedures Procedures    Medications Ordered in ED Medications  iohexol (OMNIPAQUE) 350 MG/ML injection 100 mL (100 mLs Intravenous Contrast Given 07/09/21 2009)    ED Course/ Medical Decision Making/ A&P Clinical Course as of 07/10/21 0916  Tue Jul 09, 2021  1818 Chest x-ray interpreted by me as cardiomegaly poor inspiratory effort.  No clear infiltrate seen.  Awaiting radiology reading. [MB]  [id number] Rectal exam done with tech as chaperone.  Normal tone no masses.  Sample sent to lab for guaiac. [MB]  1959 Patient's work-up so far has been fairly unremarkable.  No evidence of DVT COVID and flu negative.  BNP normal Bamba be falsely negative due to patient's size.  Does have a mild elevation in his creatinine.  Heme-negative from below.  We will put him in for a chest angio to exclude PE and get a better look at his lungs. [MB]  2054 CT does not show any signs of PE.  This Widen be left over remnant from his prior COVID.  Currently no indications for admission.  Recommending continuing his diuretic and close follow-up with his treating providers.  Explained that we will need repeat chemistry to follow renal function. [MB]    Clinical Course User Index [MB] Hayden Rasmussen, MD                           Medical Decision Making Amount and/or Complexity of Data Reviewed Labs: ordered. Radiology: ordered.  Risk Prescription drug management.  Raymond [name] was evaluated in Emergency Department on 07/09/2021 for the symptoms described in the history of present illness. He was evaluated in the context of the global COVID-19 pandemic, which necessitated consideration that the patient might be at risk for infection with the SARS-CoV-2 virus that causes COVID-19. Institutional protocols and algorithms that pertain to the evaluation of patients at risk for COVID-19 are in a state of rapid change based on information released by regulatory bodies including the CDC and federal and state organizations. These  policies and algorithms were followed during the patient's care in the ED.  This patient complains of leg swelling and pain, shortness of breath, dark stools; this involves an extensive number of treatment Options and is a complaint that carries with it a high risk of complications  and Morbidity. The differential includes PE, DVT, peripheral edema, CHF, cellulitis, AKI, GI bleed  I ordered, reviewed and interpreted labs, which included CBC with mildly elevated white count stable hemoglobin, chemistries with elevated BUN/creatinine, BNP not elevated, fecal occult negative, COVID and flu negative I ordered imaging studies which included chest x-ray, duplex bilateral lower extremities, CT angiogram and I independently    visualized and interpreted imaging which showed no acute findings Additional history obtained from patient's wife Previous records obtained and reviewed in epic including prior PCP visits  After the interventions stated above, I reevaluated the patient and found patient to be hypertensive but satting well on room air.  No indications for admission at this time.  Recommended continuing diuresis and close follow-up with his treating providers.  Return instructions discussed          Final Clinical Impression(s) / ED Diagnoses Final diagnoses:  Peripheral edema  SOB (shortness of breath)  Primary hypertension    Rx / DC Orders ED Discharge Orders     None         Hayden Rasmussen, MD 07/10/21 [phone]

## 2022-12-06 ENCOUNTER — Emergency Department (HOSPITAL_BASED_OUTPATIENT_CLINIC_OR_DEPARTMENT_OTHER)
Admission: EM | Admit: 2022-12-06 | Discharge: 2022-12-06 | Disposition: A | Discharge status: Home / Self Care | Payer: Managed Care, Other (non HMO) | Attending: Emergency Medicine | Admitting: Emergency Medicine

## 2022-12-06 ENCOUNTER — Emergency Department (HOSPITAL_BASED_OUTPATIENT_CLINIC_OR_DEPARTMENT_OTHER): Payer: Managed Care, Other (non HMO)

## 2022-12-06 ENCOUNTER — Encounter (HOSPITAL_BASED_OUTPATIENT_CLINIC_OR_DEPARTMENT_OTHER): Payer: Self-pay

## 2022-12-06 ENCOUNTER — Emergency Department (HOSPITAL_BASED_OUTPATIENT_CLINIC_OR_DEPARTMENT_OTHER): Payer: Managed Care, Other (non HMO) | Admitting: Radiology

## 2022-12-06 ENCOUNTER — Other Ambulatory Visit: Payer: Self-pay

## 2022-12-06 DIAGNOSIS — M170 Bilateral primary osteoarthritis of knee: Secondary | ICD-10-CM | POA: Diagnosis not present

## 2022-12-06 DIAGNOSIS — Z79899 Other long term (current) drug therapy: Secondary | ICD-10-CM | POA: Diagnosis not present

## 2022-12-06 DIAGNOSIS — M25561 Pain in right knee: Secondary | ICD-10-CM | POA: Diagnosis present

## 2022-12-06 DIAGNOSIS — M25461 Effusion, right knee: Secondary | ICD-10-CM

## 2022-12-06 DIAGNOSIS — Z7984 Long term (current) use of oral hypoglycemic drugs: Secondary | ICD-10-CM | POA: Diagnosis not present

## 2022-12-06 DIAGNOSIS — R52 Pain, unspecified: Secondary | ICD-10-CM

## 2022-12-06 DIAGNOSIS — N1832 Chronic kidney disease, stage 3b: Secondary | ICD-10-CM | POA: Insufficient documentation

## 2022-12-06 LAB — CBC WITH DIFFERENTIAL/PLATELET
Abs Immature Granulocytes: 0.08 10*3/uL — ABNORMAL HIGH (ref 0.00–0.07)
Basophils Absolute: 0.1 10*3/uL (ref 0.0–0.1)
Basophils Relative: 1 %
Eosinophils Absolute: 0.5 10*3/uL (ref 0.0–0.5)
Eosinophils Relative: 4 %
HCT: 36.1 % — ABNORMAL LOW (ref 39.0–52.0)
Hemoglobin: 12 g/dL — ABNORMAL LOW (ref 13.0–17.0)
Immature Granulocytes: 1 %
Lymphocytes Relative: 18 %
Lymphs Abs: 2.6 10*3/uL (ref 0.7–4.0)
MCH: 29.8 pg (ref 26.0–34.0)
MCHC: 33.2 g/dL (ref 30.0–36.0)
MCV: 89.6 fL (ref 80.0–100.0)
Monocytes Absolute: 1.4 10*3/uL — ABNORMAL HIGH (ref 0.1–1.0)
Monocytes Relative: 9 %
Neutro Abs: 9.7 10*3/uL — ABNORMAL HIGH (ref 1.7–7.7)
Neutrophils Relative %: 67 %
Platelets: 323 10*3/uL (ref 150–400)
RBC: 4.03 MIL/uL — ABNORMAL LOW (ref 4.22–5.81)
RDW: 15.2 % (ref 11.5–15.5)
WBC: 14.3 10*3/uL — ABNORMAL HIGH (ref 4.0–10.5)
nRBC: 0 % (ref 0.0–0.2)

## 2022-12-06 LAB — COMPREHENSIVE METABOLIC PANEL
ALT: 15 U/L (ref 0–44)
AST: 13 U/L — ABNORMAL LOW (ref 15–41)
Albumin: 4.1 g/dL (ref 3.5–5.0)
Alkaline Phosphatase: 88 U/L (ref 38–126)
Anion gap: 9 (ref 5–15)
BUN: 48 mg/dL — ABNORMAL HIGH (ref 6–20)
CO2: 36 mmol/L — ABNORMAL HIGH (ref 22–32)
Calcium: 10.1 mg/dL (ref 8.9–10.3)
Chloride: 92 mmol/L — ABNORMAL LOW (ref 98–111)
Creatinine, Ser: 1.97 mg/dL — ABNORMAL HIGH (ref 0.61–1.24)
GFR, Estimated: 38 mL/min — ABNORMAL LOW (ref >60)
Glucose, Bld: 96 mg/dL (ref 70–99)
Potassium: 3.3 mmol/L — ABNORMAL LOW (ref 3.5–5.1)
Sodium: 137 mmol/L (ref 135–145)
Total Bilirubin: 0.4 mg/dL (ref 0.3–1.2)
Total Protein: 7.7 g/dL (ref 6.5–8.1)

## 2022-12-06 MED ORDER — OXYCODONE HCL 5 MG PO TABS
5.0000 mg | ORAL_TABLET | Freq: Once | ORAL | Status: AC
  Administered 2022-12-06: 5 mg via ORAL
  Filled 2022-12-06: qty 1

## 2022-12-06 MED ORDER — OXYCODONE HCL 5 MG PO TABS: ORAL_TABLET | ORAL | 0 refills | Status: DC

## 2022-12-06 NOTE — ED Provider Notes (Signed)
[place] EMERGENCY DEPARTMENT AT [facility] Provider Note   CSN: [id number] Arrival date & time: 12/06/22  1158     History {Add pertinent medical, surgical, social history, OB history to HPI:1} Chief Complaint  Patient presents with   Knee Pain    Raymond [name] is a 57 y.o. male who presents emergency department for bilateral knee pain.  Patient has a longstanding history of bilateral osteoarthritis and is due for knee replacement.  He has been on Mounjaro for weight loss and still has about 40 pounds to get which is preventing his surgery.  Patient reports that yesterday he had increased pain in both knees and his knees "just gave out from underneath me."  His wife who is at bedside states he can even stand to pull his own pants up we had to use a cat litter box as a bedpan just to help and today he could not even move to get himself on the box to use the bathroom.  She states that she is unable to help him because he is too heavy for her to lift when he falls.  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  His right is in greater pain than the left.  He has not had any injections in the knees in several months and is due for 1 in September.  He rates his pain at 10 out of 10 when he tries to bear weight.  He called his orthopedist, Dr. Greig Right office today and they told him to come to the emergency room.  He feels like his knees are unstable.  He also had an increase in his peripheral edema and denies a history of heart failure.  He denies shortness of breath.  He has been taking Tylenol without relief of his pain   Knee Pain      Home Medications Prior to Admission medications   Medication Sig Start Date End Date Taking? Authorizing Provider  albuterol (PROVENTIL) (2.5 MG/3ML) 0.083% nebulizer solution Inhale 3 mLs into the lungs every 4 (four) hours as needed for wheezing or shortness of breath. 09/06/16   Rolan Bucco, MD   allopurinol (ZYLOPRIM) 300 MG tablet Take 300 mg by mouth daily.    [provider]  ALPRAZolam Prudy Feeler) 0.5 MG tablet Take 0.5 mg by mouth at bedtime as needed for anxiety.    [provider]  amLODipine-benazepril (LOTREL) 5-20 MG per capsule Take 1 capsule by mouth 2 (two) times daily. 10/03/14   [provider]  Aspirin-Acetaminophen-Caffeine (GOODY HEADACHE PO) Take 1 packet by mouth 2 (two) times daily as needed (pain).    [provider]  atorvastatin (LIPITOR) 10 MG tablet Take 10 mg by mouth at bedtime.     [provider]  carvedilol (COREG) 25 MG tablet Take 25 mg by mouth 2 (two) times daily with a meal.    [provider]  doxazosin (CARDURA) 2 MG tablet Take 2 mg by mouth at bedtime. 07/25/14   [provider]  furosemide (LASIX) 80 MG tablet Take 160 mg by mouth 2 (two) times daily.     [provider]  gabapentin (NEURONTIN) 600 MG tablet Take 600 mg by mouth at bedtime.    [provider]  glipiZIDE (GLUCOTROL) 5 MG tablet Take 5 mg by mouth 2 (two) times daily.    [provider]  hydrOXYzine (ATARAX/VISTARIL) 25 MG tablet Take 1 tablet (25 mg  total) by mouth every 6 (six) hours. 08/12/15   Rolland Porter, MD  lisinopril (PRINIVIL,ZESTRIL) 20 MG tablet Take 20 mg by mouth 2 (two) times daily.    [provider]  losartan (COZAAR) 50 MG tablet Take 50 mg by mouth daily.    [provider]  meloxicam (MOBIC) 7.5 MG tablet Take 7.5 mg by mouth daily.    [provider]  metFORMIN (GLUCOPHAGE-XR) 500 MG 24 hr tablet Take 1,000 mg by mouth daily with breakfast.  09/27/14   [provider]  nystatin-triamcinolone (MYCOLOG II) cream Apply 1 application topically daily as needed (itch).  08/06/15   [provider]  omeprazole (PRILOSEC) 40 MG capsule Take 40 mg by mouth daily.    [provider]  potassium chloride SA (K-DUR,KLOR-CON) 20 MEQ tablet Take 20  mEq by mouth 2 (two) times daily.    [provider]  pramipexole (MIRAPEX) 0.125 MG tablet Take 0.125 mg by mouth daily. 07/27/15   [provider]  predniSONE (DELTASONE) 20 MG tablet 2 by mouth daily 5 days, then one by mouth daily 5 days. 08/12/15   Rolland Porter, MD  traMADol (ULTRAM) 50 MG tablet Take 50 mg by mouth every 6 (six) hours as needed for moderate pain. For pain 09/26/14   [provider]      Allergies    Sulfa antibiotics    Review of Systems   Review of Systems  Physical Exam Updated Vital Signs BP (!) 141/78   Pulse 92   Temp 98.4 F (36.9 C) (Oral)   Resp 18   Wt (!) 155.6 kg   SpO2 98%   BMI 47.84 kg/m  Physical Exam Vitals and nursing note reviewed.  Constitutional:      General: He is not in acute distress.    Appearance: He is well-developed. He is not diaphoretic.  HENT:     Head: Normocephalic and atraumatic.  Eyes:     General: No scleral icterus.    Conjunctiva/sclera: Conjunctivae normal.  Cardiovascular:     Rate and Rhythm: Normal rate and regular rhythm.     Heart sounds: Normal heart sounds.  Pulmonary:     Effort: Pulmonary effort is normal. No respiratory distress.     Breath sounds: Normal breath sounds.  Abdominal:     Palpations: Abdomen is soft.     Tenderness: There is no abdominal tenderness.  Musculoskeletal:     Cervical back: Normal range of motion and neck supple.     Comments: Bilateral knee effusions, tender to palpation.  Range of motion is limited due to pain.  Skin:    General: Skin is warm and dry.  Neurological:     Mental Status: He is alert.  Psychiatric:        Behavior: Behavior normal.     ED Results / Procedures / Treatments   Labs (all labs ordered are listed, but only abnormal results are displayed) Labs Reviewed  COMPREHENSIVE METABOLIC PANEL  CBC WITH DIFFERENTIAL/PLATELET    EKG None  Radiology No results found.  Procedures Procedures  {Document cardiac  monitor, telemetry assessment procedure when appropriate:1}  Medications Ordered in ED Medications  oxyCODONE (Oxy IR/ROXICODONE) immediate release tablet 5 mg (has no administration in time range)    ED Course/ Medical Decision Making/ A&P   {   Click here for ABCD2, HEART and other calculatorsREFRESH Note before signing :1}  Medical Decision Making Amount and/or Complexity of Data Reviewed Labs: ordered. Radiology: ordered.  Risk Prescription drug management.   ***  {Document critical care time when appropriate:1} {Document review of labs and clinical decision tools ie heart score, Chads2Vasc2 etc:1}  {Document your independent review of radiology images, and any outside records:1} {Document your discussion with family members, caretakers, and with consultants:1} {Document social determinants of health affecting pt's care:1} {Document your decision making why or why not admission, treatments were needed:1} Final Clinical Impression(s) / ED Diagnoses Final diagnoses:  None    Rx / DC Orders ED Discharge Orders     None

## 2022-12-06 NOTE — ED Triage Notes (Signed)
Pt BIBA from home. Pt took a fall yesterday due to knees giving out. Pt denies hitting head. Pt states he needs double knee replacement. Advised to come by ortho.  251 CBG 120 palp BP 93% RA

## 2022-12-06 NOTE — Discharge Instructions (Addendum)
1) stop taking your Mobic until you speak with your primary care doctor. You Pawelski take tylenol up to 3000 mg/day 2) Call Dr. Greig Right office [facility] Monday morning to schedule a follow up appointment in the office. 3) make sure to take a bowel regimen including daily Colace MiraLAX and 25 to 30 g of fiber daily along with 64 ounces of water Ice your knees up to 5 times a day. Use your knee sleeves Use your walker at all times when trying to walk.  Contact a health care provider if: You have redness, swelling, or a feeling of warmth in a joint that gets worse. You have a fever along with joint or muscle aches. You develop a rash. You have trouble doing your normal activities. You have pain that gets worse and is not relieved by pain medicine.  IMPORTANT INFORMATION ABOUT YOUR OPIOID PRESCRIPTION    What are Opioids? How Can I Safely Take Opioids? Where Can I Get Help?  Opioids are narcotic pain medications used for moderate-to-severe pain, often after surgery or injury. Evidence shows there Desmarais be better and safer options for long term pain from back pain, fibromyalgia, and nerve pain other than opioids. Do not take more tablets or capsules than prescribed by your doctor or more often than prescribed. Do not crush long-acting tablets. Do not share or sell your prescription. Store medication in a safe, secure, dry location away from pets, children, family, and visitors. Naloxone, a medication that can reverse opioid-induced oversedation, can be purchased at some pharmacies and kept on hand for emergency use. IF YOU NEED HELP for substance abuse problems, talk with your primary care provider and contact a local resource: [facility] [phone] [facility] [phone] CURE (Community United in Response to an Epidemic)     [facility] [phone] Orlean Bradford [facility] Solution to the Opioid Problem)   [phone] [facility] [phone] Narcotics Anonymous [email or web address]  [facility] [phone] [facility]  [phone] [facility] National Helpline 1-800-662-HELP or visit [email or web address] to find a treatment center near you [facility] [phone] [facility] [phone] Updated 12/22/2017  What are the Major  Risks of Opioid Use?    Opioid use can have serious risk of addiction and overdose. Overdose is often seen as slow breathing and can cause sudden death. Other risks include tolerance (needing more for the same relief) and dependence (withdrawal when stopped).     How Do I Get Rid of Unused Opioids?    Safely dispose of all opioids as soon as you no longer need them. To dispose at home, mix the drug with an undesirable substance like coffee grounds or cat litter then throw them away. Opioids can be dropped off at local sheriff's offices or police departments. If unable to safely dispose of them as above, opioids Auten be flushed.   What are Some Ways to Help Decrease Major Risk for Overdose?    Avoid alcohol, other opioids, sedating antihistamines (Benadryl, etc.) drugs for sleep/anxiety (Xanax, Ativan, Valium, Ambien, etc.), and muscle relaxants (Flexeril, Soma, Skelaxin, etc.).

## 2022-12-21 ENCOUNTER — Emergency Department (HOSPITAL_COMMUNITY)
Admission: EM | Admit: 2022-12-21 | Discharge: 2022-12-21 | Disposition: A | Discharge status: Home / Self Care | Payer: Managed Care, Other (non HMO) | Source: Home / Self Care

## 2022-12-21 ENCOUNTER — Emergency Department (HOSPITAL_COMMUNITY): Payer: Managed Care, Other (non HMO)

## 2022-12-21 DIAGNOSIS — I130 Hypertensive heart and chronic kidney disease with heart failure and stage 1 through stage 4 chronic kidney disease, or unspecified chronic kidney disease: Secondary | ICD-10-CM | POA: Diagnosis not present

## 2022-12-21 DIAGNOSIS — Z79899 Other long term (current) drug therapy: Secondary | ICD-10-CM | POA: Insufficient documentation

## 2022-12-21 DIAGNOSIS — R2243 Localized swelling, mass and lump, lower limb, bilateral: Secondary | ICD-10-CM | POA: Insufficient documentation

## 2022-12-21 DIAGNOSIS — G8929 Other chronic pain: Secondary | ICD-10-CM | POA: Insufficient documentation

## 2022-12-21 DIAGNOSIS — E119 Type 2 diabetes mellitus without complications: Secondary | ICD-10-CM | POA: Insufficient documentation

## 2022-12-21 DIAGNOSIS — W06XXXA Fall from bed, initial encounter: Secondary | ICD-10-CM | POA: Insufficient documentation

## 2022-12-21 DIAGNOSIS — M25562 Pain in left knee: Secondary | ICD-10-CM | POA: Insufficient documentation

## 2022-12-21 DIAGNOSIS — M25561 Pain in right knee: Secondary | ICD-10-CM | POA: Insufficient documentation

## 2022-12-21 DIAGNOSIS — J449 Chronic obstructive pulmonary disease, unspecified: Secondary | ICD-10-CM | POA: Insufficient documentation

## 2022-12-21 DIAGNOSIS — I5022 Chronic systolic (congestive) heart failure: Secondary | ICD-10-CM | POA: Diagnosis not present

## 2022-12-21 DIAGNOSIS — M25571 Pain in right ankle and joints of right foot: Secondary | ICD-10-CM | POA: Insufficient documentation

## 2022-12-21 DIAGNOSIS — Z7984 Long term (current) use of oral hypoglycemic drugs: Secondary | ICD-10-CM | POA: Insufficient documentation

## 2022-12-21 DIAGNOSIS — I10 Essential (primary) hypertension: Secondary | ICD-10-CM | POA: Insufficient documentation

## 2022-12-21 DIAGNOSIS — S8264XD Nondisplaced fracture of lateral malleolus of right fibula, subsequent encounter for closed fracture with routine healing: Secondary | ICD-10-CM | POA: Insufficient documentation

## 2022-12-21 DIAGNOSIS — S8261XD Displaced fracture of lateral malleolus of right fibula, subsequent encounter for closed fracture with routine healing: Secondary | ICD-10-CM

## 2022-12-21 LAB — CBC
HCT: 35.8 % — ABNORMAL LOW (ref 39.0–52.0)
Hemoglobin: 11.3 g/dL — ABNORMAL LOW (ref 13.0–17.0)
MCH: 29.4 pg (ref 26.0–34.0)
MCHC: 31.6 g/dL (ref 30.0–36.0)
MCV: 93.2 fL (ref 80.0–100.0)
Platelets: 296 10*3/uL (ref 150–400)
RBC: 3.84 MIL/uL — ABNORMAL LOW (ref 4.22–5.81)
RDW: 15.7 % — ABNORMAL HIGH (ref 11.5–15.5)
WBC: 10.1 10*3/uL (ref 4.0–10.5)
nRBC: 0 % (ref 0.0–0.2)

## 2022-12-21 LAB — BASIC METABOLIC PANEL
Anion gap: 7 (ref 5–15)
BUN: 29 mg/dL — ABNORMAL HIGH (ref 6–20)
CO2: 27 mmol/L (ref 22–32)
Calcium: 8.6 mg/dL — ABNORMAL LOW (ref 8.9–10.3)
Chloride: 103 mmol/L (ref 98–111)
Creatinine, Ser: 1.52 mg/dL — ABNORMAL HIGH (ref 0.61–1.24)
GFR, Estimated: 52 mL/min — ABNORMAL LOW (ref >60)
Glucose, Bld: 90 mg/dL (ref 70–99)
Potassium: 3.7 mmol/L (ref 3.5–5.1)
Sodium: 137 mmol/L (ref 135–145)

## 2022-12-21 MED ORDER — OXYCODONE-ACETAMINOPHEN 5-325 MG PO TABS
1.0000 | ORAL_TABLET | Freq: Once | ORAL | Status: AC
  Administered 2022-12-21: 1 via ORAL
  Filled 2022-12-21: qty 1

## 2022-12-21 MED ORDER — FUROSEMIDE 10 MG/ML IJ SOLN
40.0000 mg | Freq: Once | INTRAMUSCULAR | Status: AC
  Administered 2022-12-21: 40 mg via INTRAVENOUS
  Filled 2022-12-21: qty 4

## 2022-12-21 MED ORDER — OXYCODONE-ACETAMINOPHEN 5-325 MG PO TABS: 1.0000 | ORAL_TABLET | Freq: Three times a day (TID) | ORAL | 0 refills | Status: AC | PRN

## 2022-12-21 MED ORDER — NAPROXEN 500 MG PO TABS
500.0000 mg | ORAL_TABLET | Freq: Once | ORAL | Status: AC
  Administered 2022-12-21: 500 mg via ORAL
  Filled 2022-12-21: qty 1

## 2022-12-21 NOTE — ED Provider Notes (Signed)
[facility] EMERGENCY DEPARTMENT AT [facility] Provider Note   CSN: [id number] Arrival date & time: 12/21/22  1340     History  Chief Complaint  Patient presents with   Knee Pain    Raymond [name] is a 57 y.o. male with PMHx COPD, DM, LE edema, HLD, HTN who presents to ED concerned for right ankle and right knee pain. Patient was getting out of bed earlier today when he tripped and fell onto the right ankle. Also complaining of his LE edema today. Patient denies hx of CHF but states that he has been on lasix x10years for LE edema.   Denies fever, chest pain, dyspnea, abdominal pain, nausea, vomiting.   Knee Pain      Home Medications Prior to Admission medications   Medication Sig Start Date End Date Taking? Authorizing Provider  oxyCODONE-acetaminophen (PERCOCET/ROXICET) 5-325 MG tablet Take 1 tablet by mouth every 8 (eight) hours as needed for up to 2 days for severe pain. 12/21/22 12/23/22 Yes , Charlotte Sanes F, PA-C  albuterol (PROVENTIL) (2.5 MG/3ML) 0.083% nebulizer solution Inhale 3 mLs into the lungs every 4 (four) hours as needed for wheezing or shortness of breath. 09/06/16   Rolan Bucco, MD  allopurinol (ZYLOPRIM) 300 MG tablet Take 300 mg by mouth daily.    [provider]  ALPRAZolam Prudy Feeler) 0.5 MG tablet Take 0.5 mg by mouth at bedtime as needed for anxiety.    [provider]  amLODipine-benazepril (LOTREL) 5-20 MG per capsule Take 1 capsule by mouth 2 (two) times daily. 10/03/14   [provider]  atorvastatin (LIPITOR) 10 MG tablet Take 10 mg by mouth at bedtime.     [provider]  carvedilol (COREG) 25 MG tablet Take 25 mg by mouth 2 (two) times daily with a meal.    [provider]  doxazosin (CARDURA) 2 MG tablet Take 2 mg by mouth at bedtime. 07/25/14   [provider]  furosemide (LASIX) 80 MG tablet Take 160 mg by mouth 2 (two) times daily.     [provider]  gabapentin (NEURONTIN)  600 MG tablet Take 600 mg by mouth at bedtime.    [provider]  glipiZIDE (GLUCOTROL) 5 MG tablet Take 5 mg by mouth 2 (two) times daily.    [provider]  hydrOXYzine (ATARAX/VISTARIL) 25 MG tablet Take 1 tablet (25 mg total) by mouth every 6 (six) hours. 08/12/15   Rolland Porter, MD  lisinopril (PRINIVIL,ZESTRIL) 20 MG tablet Take 20 mg by mouth 2 (two) times daily.    [provider]  losartan (COZAAR) 50 MG tablet Take 50 mg by mouth daily.    [provider]  meloxicam (MOBIC) 7.5 MG tablet Take 7.5 mg by mouth daily.    [provider]  metFORMIN (GLUCOPHAGE-XR) 500 MG 24 hr tablet Take 1,000 mg by mouth daily with breakfast.  09/27/14   [provider]  nystatin-triamcinolone (MYCOLOG II) cream Apply 1 application topically daily as needed (itch).  08/06/15   [provider]  omeprazole (PRILOSEC) 40 MG capsule Take 40 mg by mouth daily.    [provider]  oxyCODONE (ROXICODONE) 5 MG immediate release tablet Take 0.5 or 1 tablet (2.5-5 mg) every 4 to 6 hours as needed for severe pain 12/06/22   Arthor Captain, PA-C  potassium chloride SA (K-DUR,KLOR-CON) 20 MEQ tablet Take 20 mEq by mouth 2 (two) times daily.    [provider]  pramipexole (MIRAPEX) 0.125 MG  tablet Take 0.125 mg by mouth daily. 07/27/15   [provider]  predniSONE (DELTASONE) 20 MG tablet 2 by mouth daily 5 days, then one by mouth daily 5 days. 08/12/15   Rolland Porter, MD  traMADol (ULTRAM) 50 MG tablet Take 50 mg by mouth every 6 (six) hours as needed for moderate pain. For pain 09/26/14   [provider]      Allergies    Sulfa antibiotics    Review of Systems   Review of Systems  Musculoskeletal:        Ankle/knee pain    Physical Exam Updated Vital Signs BP (!) 185/78 (BP Location: Right Wrist)   Pulse 80   Temp 97.6 F (36.4 C) (Oral)   Resp 18   SpO2 99%  Physical Exam Vitals and nursing note reviewed.   Constitutional:      General: He is not in acute distress. HENT:     Head: Normocephalic and atraumatic.     Mouth/Throat:     Mouth: Mucous membranes are moist.     Pharynx: No oropharyngeal exudate or posterior oropharyngeal erythema.  Eyes:     General: No scleral icterus.       Right eye: No discharge.        Left eye: No discharge.     Conjunctiva/sclera: Conjunctivae normal.  Cardiovascular:     Rate and Rhythm: Normal rate.     Pulses: Normal pulses.     Heart sounds: No murmur heard. Pulmonary:     Effort: Pulmonary effort is normal. No respiratory distress.     Breath sounds: No wheezing, rhonchi or rales.  Abdominal:     Tenderness: There is no abdominal tenderness.  Musculoskeletal:     Right lower leg: No edema.     Left lower leg: No edema.     Comments: Tenderness to palpation of right knee and right ankle. No unilateral swelling, warmth, erythema, or fluctuance. +3 pitting edema of BL LE. Pedal pulse difficult to palpate bilaterally d/t swelling, but both feet with brisk capillary refill and sensation to light touch intact. Legs and feet non-tense bilaterally. Normal active ROM of right knee. Active ROM of right ankle mildly restricted d/t pain.  Skin:    General: Skin is warm and dry.     Findings: No rash.  Neurological:     General: No focal deficit present.     Mental Status: He is alert. Mental status is at baseline.  Psychiatric:        Mood and Affect: Mood normal.     ED Results / Procedures / Treatments   Labs (all labs ordered are listed, but only abnormal results are displayed) Labs Reviewed  CBC - Abnormal; Notable for the following components:      Result Value   RBC 3.84 (*)    Hemoglobin 11.3 (*)    HCT 35.8 (*)    RDW 15.7 (*)    All other components within normal limits  BASIC METABOLIC PANEL - Abnormal; Notable for the following components:   BUN 29 (*)    Creatinine, Ser 1.52 (*)    Calcium 8.6 (*)    GFR, Estimated 52 (*)    All  other components within normal limits    EKG None  Radiology DG Ankle 2 Views Right  Result Date: 12/21/2022 CLINICAL DATA:  Ankle pain. EXAM: RIGHT ANKLE - 2 VIEW COMPARISON:  None Available. FINDINGS: There is an acute fracture of the lateral malleolus. No  ankle joint dislocation. There is associated soft tissue swelling. IMPRESSION: Acute fracture of the lateral malleolus (Weber type b). Electronically Signed   By: Romona Curls M.D.   On: 12/21/2022 15:47   DG Knee 2 Views Right  Result Date: 12/21/2022 CLINICAL DATA:  Ankle pain and knee pain. EXAM: RIGHT KNEE - 1-2 VIEW COMPARISON:  None Available. FINDINGS: No evidence of fracture or dislocation. There is a moderate to large knee joint effusion. Severe tricompartmental osteoarthritis is noted. No radiopaque foreign body. There is soft tissue swelling around the knee. IMPRESSION: Moderate to large knee joint effusion. No acute osseous injury. Electronically Signed   By: Romona Curls M.D.   On: 12/21/2022 15:46    Procedures Procedures    Medications Ordered in ED Medications  oxyCODONE-acetaminophen (PERCOCET/ROXICET) 5-325 MG per tablet 1 tablet (1 tablet Oral Given 12/21/22 1443)  furosemide (LASIX) injection 40 mg (40 mg Intravenous Given 12/21/22 1748)    ED Course/ Medical Decision Making/ A&P                             Medical Decision Making Amount and/or Complexity of Data Reviewed Labs: ordered. Radiology: ordered.  Risk Prescription drug management.   This patient presents to the ED for concern of right knee/ankle pain, this involves an extensive number of treatment options, and is a complaint that carries with it a high risk of complications and morbidity.  The differential diagnosis includes hemarthrosis, gout, septic joint, fracture, compartment syndrome   Co morbidities that complicate the patient evaluation  COPD, DM, LE edema, HLD, HTN    Lab Tests:  I Ordered, and personally interpreted labs.   The pertinent results include:   - BMP: BUN/Cr mildly elevated but improved from past values CBC: mild anemia; no leukocytosis   Imaging Studies ordered:  I ordered imaging studies including Xray - knee xray: negative - ankle xray: acute fracture of lateral malleolus I independently visualized and interpreted imaging Shared findings with patient I agree with the radiologist interpretation    Problem List / ED Course / Critical interventions / Medication management  Patient presents to emergency room concern for right knee and ankle pain after mechanical fall earlier today.  Diagnosed with ankle fracture approximately 1 week ago and has been following up with orthopedics.  Patient stating that orthopedics has not yet decided if they want to perform surgery on patient's ankle.  Patient discharged from outpatient orthopedics with a boot. Patient afebrile with stable vitals.  Provided patient with Percocet which helped his pain.  Will provide patient with 2 days worth of percocet since he fell on his broken ankle today. Physical exam showing that the right leg is neurovascularly intact.  Brisk capillary refill and sensation to light touch intact.  Area is nontense.  No erythema, unilateral swelling, or warmth. Small abrasion on later right lower leg from patient's boot. No bleeding or purulence appreciated. Placed pressure dressing over abrasion. Ankle x-ray showing nondisplaced fracture of the lateral malleolus.  No changes from last ankle x-ray.  Knee x-ray without concern for fracture or dislocation.  Knee X-ray showing joint effusion-patient stating that this is chronic for him and that he is following up with orthopedics for this concern. Upon discharging patient, wife expressed concerns for patient's worsening bilateral lower extremity edema.  Patient is taking 80 mg Lasix twice daily.  I provided patient with 40 mg Lasix IV to help with diuresis of his lower  extremity edema.  Patient denying  any alarming symptoms of CHF such as chest pain or dyspnea. Educated patient on symptoms of hypotension. I have reviewed the patients home medicines and have made adjustments as needed Patient afebrile with stable vitals.  Provided return precautions.  Patient and family verbally endorsed understanding of plan.  Discharged in good condition.   Ddx these are considered less likely due to history of present illness and physical exam -hemarthrosis: joint without swelling; ROM intact -gout: no warmth or erythema; ROM intact  -septic joint: afebrile; no warmth or erythema; no skin changes; ROM intact  -compartment syndrome: area not tense; neurovascularly intact   Social Determinants of Health:  none          Final Clinical Impression(s) / ED Diagnoses Final diagnoses:  Chronic pain of both knees  Closed fracture of proximal lateral malleolus of right fibula with routine healing, subsequent encounter    Rx / DC Orders ED Discharge Orders          Ordered    PR EXTRA HEAVY DUTY WHEELCHAIR        12/21/22 1710    oxyCODONE-acetaminophen (PERCOCET/ROXICET) 5-325 MG tablet  Every 8 hours PRN        12/21/22 1755              Dorthy Cooler, PA-C 12/21/22 1758    Coral Spikes, DO 12/21/22 2025

## 2022-12-21 NOTE — Discharge Instructions (Addendum)
It was a pleasure caring for you today.  Ankle x-ray showed your right ankle fracture. You will need to follow up with orthopedics during your appointment later this week.  Seek emergency care if experiencing any new or worsening symptoms.

## 2022-12-21 NOTE — ED Triage Notes (Signed)
Pt arrived via [facility] from home for knee and ankle pain. He said that he was getting up and tripped and fell on the already bruised right ankle. He also has an open sore on right ankle from the rubbing of the boot. He has a hx of CHF and is on lasix.  Patient states pain is a 9 on a scale of 0-10   BP 153/69 HR 78 O2 100RA

## 2022-12-21 NOTE — ED Notes (Signed)
Scanned Bladder and got a volume of 288 and 

## 2022-12-24 ENCOUNTER — Inpatient Hospital Stay (HOSPITAL_COMMUNITY): Payer: Managed Care, Other (non HMO)

## 2022-12-24 ENCOUNTER — Encounter (HOSPITAL_COMMUNITY): Payer: Self-pay | Admitting: Internal Medicine

## 2022-12-24 ENCOUNTER — Other Ambulatory Visit: Payer: Self-pay

## 2022-12-24 ENCOUNTER — Encounter: Payer: Self-pay | Admitting: Internal Medicine

## 2022-12-24 ENCOUNTER — Inpatient Hospital Stay (HOSPITAL_COMMUNITY)
Admission: RE | Admit: 2022-12-24 | Discharge: 2023-01-12 | DRG: 292 | Disposition: A | Discharge status: Skilled Nursing Facility | Payer: Managed Care, Other (non HMO) | Attending: Internal Medicine | Admitting: Internal Medicine

## 2022-12-24 ENCOUNTER — Encounter (HOSPITAL_COMMUNITY): Payer: Self-pay

## 2022-12-24 DIAGNOSIS — M170 Bilateral primary osteoarthritis of knee: Secondary | ICD-10-CM | POA: Diagnosis present

## 2022-12-24 DIAGNOSIS — M7989 Other specified soft tissue disorders: Secondary | ICD-10-CM | POA: Diagnosis present

## 2022-12-24 DIAGNOSIS — N1831 Chronic kidney disease, stage 3a: Secondary | ICD-10-CM | POA: Diagnosis present

## 2022-12-24 DIAGNOSIS — Z79899 Other long term (current) drug therapy: Secondary | ICD-10-CM

## 2022-12-24 DIAGNOSIS — R627 Adult failure to thrive: Secondary | ICD-10-CM | POA: Diagnosis present

## 2022-12-24 DIAGNOSIS — R600 Localized edema: Secondary | ICD-10-CM | POA: Diagnosis present

## 2022-12-24 DIAGNOSIS — Z833 Family history of diabetes mellitus: Secondary | ICD-10-CM

## 2022-12-24 DIAGNOSIS — E876 Hypokalemia: Secondary | ICD-10-CM | POA: Diagnosis not present

## 2022-12-24 DIAGNOSIS — W010XXA Fall on same level from slipping, tripping and stumbling without subsequent striking against object, initial encounter: Secondary | ICD-10-CM | POA: Diagnosis present

## 2022-12-24 DIAGNOSIS — R601 Generalized edema: Secondary | ICD-10-CM | POA: Diagnosis not present

## 2022-12-24 DIAGNOSIS — M48061 Spinal stenosis, lumbar region without neurogenic claudication: Secondary | ICD-10-CM | POA: Diagnosis present

## 2022-12-24 DIAGNOSIS — Z882 Allergy status to sulfonamides status: Secondary | ICD-10-CM

## 2022-12-24 DIAGNOSIS — E1122 Type 2 diabetes mellitus with diabetic chronic kidney disease: Secondary | ICD-10-CM | POA: Diagnosis present

## 2022-12-24 DIAGNOSIS — R292 Abnormal reflex: Secondary | ICD-10-CM | POA: Diagnosis present

## 2022-12-24 DIAGNOSIS — G4730 Sleep apnea, unspecified: Secondary | ICD-10-CM | POA: Diagnosis present

## 2022-12-24 DIAGNOSIS — K219 Gastro-esophageal reflux disease without esophagitis: Secondary | ICD-10-CM | POA: Diagnosis present

## 2022-12-24 DIAGNOSIS — F17210 Nicotine dependence, cigarettes, uncomplicated: Secondary | ICD-10-CM | POA: Diagnosis present

## 2022-12-24 DIAGNOSIS — Z6841 Body Mass Index (BMI) 40.0 and over, adult: Secondary | ICD-10-CM

## 2022-12-24 DIAGNOSIS — K5900 Constipation, unspecified: Secondary | ICD-10-CM | POA: Diagnosis not present

## 2022-12-24 DIAGNOSIS — F419 Anxiety disorder, unspecified: Secondary | ICD-10-CM | POA: Diagnosis present

## 2022-12-24 DIAGNOSIS — I5021 Acute systolic (congestive) heart failure: Secondary | ICD-10-CM | POA: Diagnosis not present

## 2022-12-24 DIAGNOSIS — R200 Anesthesia of skin: Secondary | ICD-10-CM | POA: Diagnosis present

## 2022-12-24 DIAGNOSIS — M109 Gout, unspecified: Secondary | ICD-10-CM | POA: Diagnosis present

## 2022-12-24 DIAGNOSIS — E559 Vitamin D deficiency, unspecified: Secondary | ICD-10-CM | POA: Diagnosis present

## 2022-12-24 DIAGNOSIS — J449 Chronic obstructive pulmonary disease, unspecified: Secondary | ICD-10-CM | POA: Diagnosis present

## 2022-12-24 DIAGNOSIS — I130 Hypertensive heart and chronic kidney disease with heart failure and stage 1 through stage 4 chronic kidney disease, or unspecified chronic kidney disease: Principal | ICD-10-CM | POA: Diagnosis present

## 2022-12-24 DIAGNOSIS — S8261XA Displaced fracture of lateral malleolus of right fibula, initial encounter for closed fracture: Secondary | ICD-10-CM | POA: Diagnosis present

## 2022-12-24 DIAGNOSIS — I5022 Chronic systolic (congestive) heart failure: Secondary | ICD-10-CM | POA: Diagnosis present

## 2022-12-24 DIAGNOSIS — G8929 Other chronic pain: Secondary | ICD-10-CM | POA: Diagnosis present

## 2022-12-24 DIAGNOSIS — Z791 Long term (current) use of non-steroidal anti-inflammatories (NSAID): Secondary | ICD-10-CM

## 2022-12-24 DIAGNOSIS — E119 Type 2 diabetes mellitus without complications: Secondary | ICD-10-CM

## 2022-12-24 DIAGNOSIS — I10 Essential (primary) hypertension: Principal | ICD-10-CM

## 2022-12-24 DIAGNOSIS — R0989 Other specified symptoms and signs involving the circulatory and respiratory systems: Secondary | ICD-10-CM | POA: Diagnosis not present

## 2022-12-24 DIAGNOSIS — Z7401 Bed confinement status: Secondary | ICD-10-CM

## 2022-12-24 DIAGNOSIS — Z8249 Family history of ischemic heart disease and other diseases of the circulatory system: Secondary | ICD-10-CM

## 2022-12-24 DIAGNOSIS — E785 Hyperlipidemia, unspecified: Secondary | ICD-10-CM | POA: Diagnosis present

## 2022-12-24 DIAGNOSIS — M62838 Other muscle spasm: Secondary | ICD-10-CM | POA: Diagnosis present

## 2022-12-24 DIAGNOSIS — Z7984 Long term (current) use of oral hypoglycemic drugs: Secondary | ICD-10-CM

## 2022-12-24 DIAGNOSIS — Z720 Tobacco use: Secondary | ICD-10-CM | POA: Diagnosis present

## 2022-12-24 LAB — URINALYSIS, COMPLETE (UACMP) WITH MICROSCOPIC
Bacteria, UA: NONE SEEN
Bilirubin Urine: NEGATIVE
Glucose, UA: NEGATIVE mg/dL
Hgb urine dipstick: NEGATIVE
Ketones, ur: NEGATIVE mg/dL
Leukocytes,Ua: NEGATIVE
Nitrite: NEGATIVE
Protein, ur: 30 mg/dL — AB
Specific Gravity, Urine: 1.01 (ref 1.005–1.030)
pH: 6 (ref 5.0–8.0)

## 2022-12-24 LAB — COMPREHENSIVE METABOLIC PANEL
ALT: 20 U/L (ref 0–44)
AST: 17 U/L (ref 15–41)
Albumin: 3.2 g/dL — ABNORMAL LOW (ref 3.5–5.0)
Alkaline Phosphatase: 101 U/L (ref 38–126)
Anion gap: 11 (ref 5–15)
BUN: 23 mg/dL — ABNORMAL HIGH (ref 6–20)
CO2: 27 mmol/L (ref 22–32)
Calcium: 9 mg/dL (ref 8.9–10.3)
Chloride: 100 mmol/L (ref 98–111)
Creatinine, Ser: 1.62 mg/dL — ABNORMAL HIGH (ref 0.61–1.24)
GFR, Estimated: 49 mL/min — ABNORMAL LOW (ref >60)
Glucose, Bld: 113 mg/dL — ABNORMAL HIGH (ref 70–99)
Potassium: 3.8 mmol/L (ref 3.5–5.1)
Sodium: 138 mmol/L (ref 135–145)
Total Bilirubin: 0.5 mg/dL (ref 0.3–1.2)
Total Protein: 7.3 g/dL (ref 6.5–8.1)

## 2022-12-24 LAB — CBC WITH DIFFERENTIAL/PLATELET
Abs Immature Granulocytes: 0.09 10*3/uL — ABNORMAL HIGH (ref 0.00–0.07)
Basophils Absolute: 0.1 10*3/uL (ref 0.0–0.1)
Basophils Relative: 1 %
Eosinophils Absolute: 0.5 10*3/uL (ref 0.0–0.5)
Eosinophils Relative: 4 %
HCT: 35.1 % — ABNORMAL LOW (ref 39.0–52.0)
Hemoglobin: 11.2 g/dL — ABNORMAL LOW (ref 13.0–17.0)
Immature Granulocytes: 1 %
Lymphocytes Relative: 14 %
Lymphs Abs: 1.7 10*3/uL (ref 0.7–4.0)
MCH: 30.4 pg (ref 26.0–34.0)
MCHC: 31.9 g/dL (ref 30.0–36.0)
MCV: 95.1 fL (ref 80.0–100.0)
Monocytes Absolute: 0.9 10*3/uL (ref 0.1–1.0)
Monocytes Relative: 8 %
Neutro Abs: 8.7 10*3/uL — ABNORMAL HIGH (ref 1.7–7.7)
Neutrophils Relative %: 72 %
Platelets: 292 10*3/uL (ref 150–400)
RBC: 3.69 MIL/uL — ABNORMAL LOW (ref 4.22–5.81)
RDW: 16.1 % — ABNORMAL HIGH (ref 11.5–15.5)
WBC: 12.1 10*3/uL — ABNORMAL HIGH (ref 4.0–10.5)
nRBC: 0 % (ref 0.0–0.2)

## 2022-12-24 LAB — SODIUM, URINE, RANDOM: Sodium, Ur: 61 mmol/L

## 2022-12-24 LAB — APTT: aPTT: 28 seconds (ref 24–36)

## 2022-12-24 LAB — GLUCOSE, CAPILLARY
Glucose-Capillary: 131 mg/dL — ABNORMAL HIGH (ref 70–99)
Glucose-Capillary: 156 mg/dL — ABNORMAL HIGH (ref 70–99)

## 2022-12-24 LAB — ABO/RH: ABO/RH(D): O POS

## 2022-12-24 LAB — TYPE AND SCREEN
ABO/RH(D): O POS
Antibody Screen: NEGATIVE

## 2022-12-24 LAB — PROTIME-INR
INR: 1 (ref 0.8–1.2)
Prothrombin Time: 13.8 seconds (ref 11.4–15.2)

## 2022-12-24 LAB — TROPONIN I (HIGH SENSITIVITY): Troponin I (High Sensitivity): 8 ng/L (ref <18)

## 2022-12-24 LAB — PROTEIN / CREATININE RATIO, URINE
Creatinine, Urine: 47 mg/dL
Protein Creatinine Ratio: 1.06 mg/mg{Cre} — ABNORMAL HIGH (ref 0.00–0.15)
Total Protein, Urine: 50 mg/dL

## 2022-12-24 LAB — BRAIN NATRIURETIC PEPTIDE: B Natriuretic Peptide: 179 pg/mL — ABNORMAL HIGH (ref 0.0–100.0)

## 2022-12-24 MED ORDER — ALLOPURINOL 300 MG PO TABS
300.0000 mg | ORAL_TABLET | Freq: Every day | ORAL | Status: DC
  Administered 2022-12-24 – 2023-01-12 (×20): 300 mg via ORAL
  Filled 2022-12-24 (×20): qty 1

## 2022-12-24 MED ORDER — LOSARTAN POTASSIUM 50 MG PO TABS
50.0000 mg | ORAL_TABLET | Freq: Every day | ORAL | Status: DC
  Administered 2022-12-24 – 2023-01-12 (×20): 50 mg via ORAL
  Filled 2022-12-24 (×20): qty 1

## 2022-12-24 MED ORDER — INSULIN ASPART 100 UNIT/ML IJ SOLN
0.0000 [IU] | Freq: Three times a day (TID) | INTRAMUSCULAR | Status: DC
  Administered 2022-12-25 (×2): 2 [IU] via SUBCUTANEOUS
  Administered 2022-12-25: 3 [IU] via SUBCUTANEOUS
  Administered 2022-12-26 (×3): 2 [IU] via SUBCUTANEOUS
  Administered 2022-12-27: 5 [IU] via SUBCUTANEOUS
  Administered 2022-12-27 – 2022-12-28 (×2): 2 [IU] via SUBCUTANEOUS
  Administered 2022-12-28 (×2): 3 [IU] via SUBCUTANEOUS
  Administered 2022-12-29 – 2022-12-30 (×6): 2 [IU] via SUBCUTANEOUS
  Administered 2022-12-31 (×2): 3 [IU] via SUBCUTANEOUS
  Administered 2022-12-31 – 2023-01-02 (×5): 2 [IU] via SUBCUTANEOUS
  Administered 2023-01-02 – 2023-01-03 (×3): 3 [IU] via SUBCUTANEOUS
  Administered 2023-01-03: 2 [IU] via SUBCUTANEOUS
  Administered 2023-01-03 – 2023-01-04 (×2): 3 [IU] via SUBCUTANEOUS
  Administered 2023-01-04: 2 [IU] via SUBCUTANEOUS
  Administered 2023-01-04 – 2023-01-05 (×2): 3 [IU] via SUBCUTANEOUS
  Administered 2023-01-05 (×2): 2 [IU] via SUBCUTANEOUS
  Administered 2023-01-06 (×2): 3 [IU] via SUBCUTANEOUS
  Administered 2023-01-07 (×2): 2 [IU] via SUBCUTANEOUS
  Administered 2023-01-07 – 2023-01-08 (×2): 3 [IU] via SUBCUTANEOUS
  Administered 2023-01-08: 5 [IU] via SUBCUTANEOUS
  Administered 2023-01-09 – 2023-01-10 (×3): 3 [IU] via SUBCUTANEOUS
  Administered 2023-01-10 – 2023-01-11 (×4): 2 [IU] via SUBCUTANEOUS
  Administered 2023-01-11: 5 [IU] via SUBCUTANEOUS
  Administered 2023-01-12: 2 [IU] via SUBCUTANEOUS
  Administered 2023-01-12: 3 [IU] via SUBCUTANEOUS

## 2022-12-24 MED ORDER — OXYCODONE HCL 5 MG PO TABS
5.0000 mg | ORAL_TABLET | ORAL | Status: AC
  Administered 2022-12-24: 5 mg via ORAL
  Filled 2022-12-24: qty 1

## 2022-12-24 MED ORDER — ATORVASTATIN CALCIUM 10 MG PO TABS
10.0000 mg | ORAL_TABLET | Freq: Every day | ORAL | Status: DC
  Administered 2022-12-24 – 2023-01-11 (×19): 10 mg via ORAL
  Filled 2022-12-24 (×19): qty 1

## 2022-12-24 MED ORDER — FUROSEMIDE 40 MG PO TABS
40.0000 mg | ORAL_TABLET | Freq: Every day | ORAL | Status: DC
  Administered 2022-12-25: 40 mg via ORAL
  Filled 2022-12-24: qty 1

## 2022-12-24 MED ORDER — FUROSEMIDE 10 MG/ML IJ SOLN: 40.0000 mg | Freq: Once | INTRAMUSCULAR | Status: DC

## 2022-12-24 MED ORDER — INSULIN ASPART 100 UNIT/ML IJ SOLN
0.0000 [IU] | Freq: Every day | INTRAMUSCULAR | Status: DC
  Administered 2023-01-06: 2 [IU] via SUBCUTANEOUS

## 2022-12-24 MED ORDER — SODIUM CHLORIDE 0.9% FLUSH
3.0000 mL | Freq: Two times a day (BID) | INTRAVENOUS | Status: DC
  Administered 2022-12-25 – 2023-01-11 (×36): 3 mL via INTRAVENOUS

## 2022-12-24 MED ORDER — ALBUTEROL SULFATE (2.5 MG/3ML) 0.083% IN NEBU: 3.0000 mL | INHALATION_SOLUTION | RESPIRATORY_TRACT | Status: DC | PRN

## 2022-12-24 MED ORDER — SENNOSIDES-DOCUSATE SODIUM 8.6-50 MG PO TABS
1.0000 | ORAL_TABLET | Freq: Two times a day (BID) | ORAL | Status: DC
  Administered 2022-12-25 – 2023-01-12 (×36): 1 via ORAL
  Filled 2022-12-24 (×37): qty 1

## 2022-12-24 MED ORDER — ENOXAPARIN SODIUM 40 MG/0.4ML IJ SOSY: 40.0000 mg | PREFILLED_SYRINGE | INTRAMUSCULAR | Status: DC

## 2022-12-24 MED ORDER — DOXAZOSIN MESYLATE 1 MG PO TABS
2.0000 mg | ORAL_TABLET | Freq: Every day | ORAL | Status: DC
  Administered 2022-12-24 – 2023-01-11 (×19): 2 mg via ORAL
  Filled 2022-12-24 (×19): qty 2

## 2022-12-24 MED ORDER — NICOTINE 14 MG/24HR TD PT24
14.0000 mg | MEDICATED_PATCH | Freq: Every day | TRANSDERMAL | Status: DC
  Administered 2022-12-29: 14 mg via TRANSDERMAL
  Filled 2022-12-24 (×11): qty 1

## 2022-12-24 MED ORDER — PANTOPRAZOLE SODIUM 20 MG PO TBEC
20.0000 mg | DELAYED_RELEASE_TABLET | Freq: Every day | ORAL | Status: DC
  Administered 2022-12-24 – 2023-01-12 (×20): 20 mg via ORAL
  Filled 2022-12-24 (×20): qty 1

## 2022-12-24 MED ORDER — ACETAMINOPHEN 650 MG RE SUPP: 650.0000 mg | Freq: Four times a day (QID) | RECTAL | Status: DC | PRN

## 2022-12-24 MED ORDER — ACETAMINOPHEN 325 MG PO TABS
650.0000 mg | ORAL_TABLET | ORAL | Status: AC
  Administered 2022-12-24 – 2022-12-27 (×14): 650 mg via ORAL
  Filled 2022-12-24 (×14): qty 2

## 2022-12-24 MED ORDER — POLYETHYLENE GLYCOL 3350 17 G PO PACK
17.0000 g | PACK | Freq: Every day | ORAL | Status: DC | PRN
  Administered 2022-12-29: 17 g via ORAL
  Filled 2022-12-24: qty 1

## 2022-12-24 MED ORDER — ALPRAZOLAM 0.5 MG PO TABS
0.5000 mg | ORAL_TABLET | Freq: Two times a day (BID) | ORAL | Status: DC | PRN
  Administered 2022-12-24 – 2023-01-11 (×13): 0.5 mg via ORAL
  Filled 2022-12-24 (×15): qty 1

## 2022-12-24 MED ORDER — NICOTINE POLACRILEX 2 MG MT GUM: 2.0000 mg | CHEWING_GUM | OROMUCOSAL | Status: DC | PRN

## 2022-12-24 MED ORDER — ACETAMINOPHEN 325 MG PO TABS
650.0000 mg | ORAL_TABLET | Freq: Four times a day (QID) | ORAL | Status: DC | PRN
  Administered 2022-12-24: 650 mg via ORAL
  Filled 2022-12-24: qty 2

## 2022-12-24 MED ORDER — FUROSEMIDE 40 MG PO TABS: 40.0000 mg | ORAL_TABLET | Freq: Every day | ORAL | Status: DC

## 2022-12-24 MED ORDER — FUROSEMIDE 10 MG/ML IJ SOLN
80.0000 mg | Freq: Once | INTRAMUSCULAR | Status: AC
  Administered 2022-12-24: 80 mg via INTRAVENOUS
  Filled 2022-12-24: qty 8

## 2022-12-24 MED ORDER — DULOXETINE HCL 60 MG PO CPEP
60.0000 mg | ORAL_CAPSULE | Freq: Every day | ORAL | Status: DC
  Administered 2022-12-24 – 2023-01-12 (×20): 60 mg via ORAL
  Filled 2022-12-24 (×20): qty 1

## 2022-12-24 MED ORDER — ENOXAPARIN SODIUM 40 MG/0.4ML IJ SOSY
40.0000 mg | PREFILLED_SYRINGE | INTRAMUSCULAR | Status: DC
  Administered 2022-12-24: 40 mg via SUBCUTANEOUS
  Filled 2022-12-24: qty 0.4

## 2022-12-24 MED ORDER — OXYCODONE HCL 5 MG PO TABS
5.0000 mg | ORAL_TABLET | ORAL | Status: DC | PRN
  Administered 2022-12-24 – 2023-01-12 (×49): 5 mg via ORAL
  Filled 2022-12-24 (×49): qty 1

## 2022-12-24 NOTE — Assessment & Plan Note (Signed)
This is manifesting mainly as decline in patient's functional status.  Based on my evaluation my principal concern is that the patient is having so much lower extremity edema that the weight of his extremities is impairing his gait.,  See lower extremity edema problem below.  Aside from this patient is reporting right lateral thigh numbness.  At this time I will spot start with radiologic survey of the patient's spine to see if we are missing an occult fracture, there is no focal tenderness there however.  Similarly patient is reporting chronic right knee pain and his knee was x-rayed as recently as July 21 after a fall.  However an acute fracture can possibly be based missed on x-ray and therefore I will repeat an x-ray now that it has been over 72 hours to see and ensure that there is no fracture there.  Please follow-up x-rays as ordered of the right lower extremity.  Patient will need physical therapy evaluation as well

## 2022-12-24 NOTE — Assessment & Plan Note (Signed)
sMocking cessation counseling provided, RN to provide smoking cessation education material.  Nicotine replacement therapy ordered

## 2022-12-24 NOTE — Assessment & Plan Note (Signed)
Insulin therapy ordered. Hold oral agents

## 2022-12-24 NOTE — H&P (Signed)
History and Physical    Patient: Raymond [name]:[id number] DOB: [birth year] DOA: 12/24/2022 DOS: the patient was seen and examined on 12/24/2022 PCP: Venetia Constable, MD  Patient coming from:  sent from outpatinet clinic.  Chief Complaint: No chief complaint on file.  HPI: Raymond [name] is a 57 y.o. male with medical history significant of obesity and other medical problems as listed below.  Patient was in his usual state of health till approximately December 06, 2022 when patient describes having chronic leg weakness with buckling of legs below him.  Patient actually presented to ER affiliated with [facility] health.  An x-ray of the right ankle revealed acute fracture of the lateral malleolus.  I am advised by the wife and patient that there is no skin injury overlying the fracture.  Patient was treated in the ER and discharged home with a brace.  Patient subsequently followed up with outpatient orthopedic surgeon on December 14, 2022.  I do not have access to office records from the orthopedic visit on December 14, 2022.  However the wife advises that the plan was for serial x-ray monitoring of the fracture, conservative management with a brace for about 6 to 8 weeks.  Unfortunately on December 21, 2022, 3 days ago, patient had another fall that is attributed to instability of gait due to right ankle fracture.  Patient has since then been reporting numbness on the right lateral thigh.  Patient does not report any new focal weakness since then nor any back pain or bladder or bowel changes.  However patient does report having progressive leg swelling bilateral since then.  Which is worse than his chronic leg swelling.  Patient was evaluated at ER of [facility] on December 21, 2022 subsequent to his fall.  Patient is also evaluated at the same time on December 21, 2022 for right knee pain.  Patient had x-ray testing done of areas of concern, no new acute fracture was found, patient was discharged home.  However wife describes that  the patient has pretty much been bedbound since his discharge from [facility] ER on December 21, 2022.  Patient is not able to bear weight on the right lower extremity.  Patient ascribes this to right knee pain.  Patient was seen at outpatient orthopedic clinic today, and given his poor functional status, transferred to [facility] inpatient at [facility] long.  Patient is now seen at [facility] long hospital in the inpatient unit on a telemetry bed.  As mentioned above patient has developed worsening lower extremity swelling over the last 2 to 3 weeks.  There is no chest pain or shortness of breath.  Patient has developed progressive worsening gait instability with pretty much bedbound status for the last 3 days.  Patient's only complaint of pain is really in the right knee that he attributes as chronic.  Patient claims that the right ankle is not really hurting that much.  Patient does not report any back pain or any bladder or bowel changes.  No nausea vomiting diarrhea syncope or new focal weakness is reported by patient.  Patient does report of numbness sensation of the right lateral thigh. Review of Systems: As mentioned in the history of present illness. All other systems reviewed and are negative. Past Medical History:  Diagnosis Date   Anxiety    COPD (chronic obstructive pulmonary disease) (HCC)    Diabetes mellitus without complication (HCC)    Edema    GERD (gastroesophageal reflux disease)    Gout  Heavy smoker    Hyperlipidemia    Hypertension    Sleep apnea    uses a cpap   Past Surgical History:  Procedure Laterality Date   CHONDROPLASTY Left 06/28/2014   Procedure: CHONDROPLASTY;  Surgeon: Thera Flake., MD;  Location: [facility];  Service: Orthopedics;  Laterality: Left;   COLONOSCOPY     FOOT FASCIOTOMY     age 66-rt   KNEE ARTHROSCOPY WITH LATERAL MENISECTOMY Left 06/28/2014   Procedure: KNEE ARTHROSCOPY WITH LATERAL MENISECTOMY;  Surgeon: Thera Flake., MD;  Location: [facility];  Service: Orthopedics;  Laterality: Left;   KNEE ARTHROSCOPY WITH MEDIAL MENISECTOMY Left 06/28/2014   Procedure: LEFT KNEE ARTHROSCOPY CHONDROPLASTY/WITH MEDIAL/LATERAL MENISECTOMIES;  Surgeon: Thera Flake., MD;  Location: [facility];  Service: Orthopedics;  Laterality: Left;   ORIF RADIUS & ULNA FRACTURES  2007   left   Social History:  reports that he has been smoking cigarettes. He has never used smokeless tobacco. He reports current alcohol use. He reports that he does not use drugs. Advied to quit tobacco by auther Allergies  Allergen Reactions   Sulfa Antibiotics Hives    Family History  Problem Relation Age of Onset   Diabetes Mother    Hypertension Mother    Diabetes Father    Hypertension Father    Cancer Other     Prior to Admission medications   Medication Sig Start Date End Date Taking? Authorizing Provider  albuterol (PROVENTIL) (2.5 MG/3ML) 0.083% nebulizer solution Inhale 3 mLs into the lungs every 4 (four) hours as needed for wheezing or shortness of breath. 09/06/16   Rolan Bucco, MD  allopurinol (ZYLOPRIM) 300 MG tablet Take 300 mg by mouth daily.    [provider]  ALPRAZolam Prudy Feeler) 0.5 MG tablet Take 0.5 mg by mouth at bedtime as needed for anxiety.    [provider]  amLODipine-benazepril (LOTREL) 5-20 MG per capsule Take 1 capsule by mouth 2 (two) times daily. 10/03/14   [provider]  atorvastatin (LIPITOR) 10 MG tablet Take 10 mg by mouth at bedtime.     [provider]  carvedilol (COREG) 25 MG tablet Take 25 mg by mouth 2 (two) times daily with a meal.    [provider]  doxazosin (CARDURA) 2 MG tablet Take 2 mg by mouth at bedtime. 07/25/14   [provider]  furosemide (LASIX) 80 MG tablet Take 160 mg by mouth 2 (two) times daily.     [provider]  gabapentin (NEURONTIN) 600 MG tablet Take 600 mg by mouth at bedtime.    [provider]   glipiZIDE (GLUCOTROL) 5 MG tablet Take 5 mg by mouth 2 (two) times daily.    [provider]  hydrOXYzine (ATARAX/VISTARIL) 25 MG tablet Take 1 tablet (25 mg total) by mouth every 6 (six) hours. 08/12/15   Rolland Porter, MD  lisinopril (PRINIVIL,ZESTRIL) 20 MG tablet Take 20 mg by mouth 2 (two) times daily.    [provider]  losartan (COZAAR) 50 MG tablet Take 50 mg by mouth daily.    [provider]  meloxicam (MOBIC) 7.5 MG tablet Take 7.5 mg by mouth daily.    [provider]  metFORMIN (GLUCOPHAGE-XR) 500 MG 24 hr tablet Take 1,000 mg by mouth daily with breakfast.  09/27/14   [provider]  nystatin-triamcinolone (MYCOLOG II) cream Apply 1 application topically daily as needed (itch).  08/06/15  [provider]  omeprazole (PRILOSEC) 40 MG capsule Take 40 mg by mouth daily.    [provider]  oxyCODONE (ROXICODONE) 5 MG immediate release tablet Take 0.5 or 1 tablet (2.5-5 mg) every 4 to 6 hours as needed for severe pain 12/06/22   Arthor Captain, PA-C  potassium chloride SA (K-DUR,KLOR-CON) 20 MEQ tablet Take 20 mEq by mouth 2 (two) times daily.    [provider]  pramipexole (MIRAPEX) 0.125 MG tablet Take 0.125 mg by mouth daily. 07/27/15   [provider]  predniSONE (DELTASONE) 20 MG tablet 2 by mouth daily 5 days, then one by mouth daily 5 days. 08/12/15   Rolland Porter, MD  traMADol (ULTRAM) 50 MG tablet Take 50 mg by mouth every 6 (six) hours as needed for moderate pain. For pain 09/26/14   [provider]   Wife is not 100% sure about patient's home medications, I have done a verbal history with the wife and patient and ordered medications according to the best understanding.  However I have encouraged the wife to bring the actual pill bottles from home tomorrow and we will proceed accordingly Physical Exam: Vitals:   12/24/22 1519  Weight: (!) 164.4 kg  Height: 5\' 11"  (1.803 m)   general: Obese  gentleman, does not appear to be in any immediate distress patient is tolerating oral diet, cooperative with exam and pleasant overall Respiratory exam: Bilateral intravesicular Cardiovascular exam S1-S2 normal Abdomen all quadrants soft nontender Extremities: Right lower extremity is in immobilization brace from the knee down.  There is 2+ edema up to the mid thigh bilaterally.  Distal function is intact.  I did not remove the brace, I did not appreciate any focal tenderness of the right knee movement is preserved, on the right knee, on the hip joint on the right side with flexion and extension being pain-free.  Similarly no focal deficit on the left lower extremity or either upper extremities noted.  Although patient is reporting numbness, on the right lateral thigh, there is not any skin changes or complete anesthesia of that site Data Reviewed:  Labs on Admission:  No results found for this or any previous visit (from the past 24 hour(s)). Basic Metabolic Panel: Recent Labs  Lab 12/21/22 1500  NA 137  K 3.7  CL 103  CO2 27  GLUCOSE 90  BUN 29*  CREATININE 1.52*  CALCIUM 8.6*   Liver Function Tests: No results for input(s): "AST", "ALT", "ALKPHOS", "BILITOT", "PROT", "ALBUMIN" in the last 168 hours. No results for input(s): "LIPASE", "AMYLASE" in the last 168 hours. No results for input(s): "AMMONIA" in the last 168 hours. CBC: Recent Labs  Lab 12/21/22 1500  WBC 10.1  HGB 11.3*  HCT 35.8*  MCV 93.2  PLT 296   Cardiac Enzymes: No results for input(s): "CKTOTAL", "CKMB", "CKMBINDEX", "TROPONINIHS" in the last 168 hours.  BNP (last 3 results) No results for input(s): "PROBNP" in the last 8760 hours. CBG: No results for input(s): "GLUCAP" in the last 168 hours.  Radiological Exams on Admission:  No results found.  EKG: pending   Assessment and Plan: * Failure to thrive in adult This is manifesting mainly as decline in patient's functional status.  Based on my  evaluation my principal concern is that the patient is having so much lower extremity edema that the weight of his extremities is impairing his gait.,  See lower extremity edema problem below.  Aside from this patient is reporting right lateral thigh numbness.  At this time I will spot start with radiologic survey of the patient's spine to see if we are missing an occult fracture, there is no focal tenderness there however.  Similarly patient is reporting chronic right knee pain and his knee was x-rayed as recently as July 21 after a fall.  However an acute fracture can possibly be based missed on x-ray and therefore I will repeat an x-ray now that it has been over 72 hours to see and ensure that there is no fracture there.  Please follow-up x-rays as ordered of the right lower extremity.  Patient will need physical therapy evaluation as well  Tobacco abuse sMocking cessation counseling provided, RN to provide smoking cessation education material.  Nicotine replacement therapy ordered  Type 2 diabetes mellitus, without long-term current use of insulin (HCC) Insulin therapy ordered. Hold oral agents  Edema of extremities Patient's edema is reportedly worse over the last 3 weeks as per wife and patient.  It is most consistent in a pattern of fluid overload, however we will we will do lower extremity Dopplers to make sure were not dealing with a occult DVT situation.  I will give empiric Lasix 40 mg today, check daily weights intake and output, start the patient on p.o. Lasix daily.  Please note apparently patient is on 160 mg of Lasix twice a day, which is a little bit of an atypical dose, therefore I will start with 40 till we actually verify what the patient takes at home with pill bottles.  I will check urine protein with creatinine ratio, congestive heart failure screening with echo, troponin, EKG.  Check LFTs and BMP today   DVT prophylaxis with Lovenox ordered, please follow-up with patient's wife  who plans to bring patient's pill bottles to the hospital soon.    Advance Care Planning:   Code Status: Full Code   Consults: Patient sent into the hospital from the orthopedic office, I anticipate orthopedic will follow-up in the morning with the patient.  However plan from orthopedic thus far has been conservative management of patient's right ankle fracture.  Family Communication: Wife at the bedside for this encounter  Severity of Illness: The appropriate patient status for this patient is INPATIENT. Inpatient status is judged to be reasonable and necessary in order to provide the required intensity of service to ensure the patient's safety. The patient's presenting symptoms, physical exam findings, and initial radiographic and laboratory data in the context of their chronic comorbidities is felt to place them at high risk for further clinical deterioration. Furthermore, it is not anticipated that the patient will be medically stable for discharge from the hospital within 2 midnights of admission.   * I certify that at the point of admission it is my clinical judgment that the patient will require inpatient hospital care spanning beyond 2 midnights from the point of admission due to high intensity of service, high risk for further deterioration and high frequency of surveillance required.*  Author: Nolberto Hanlon, MD 12/24/2022 4:50 PM  For on call review [email or web address].

## 2022-12-24 NOTE — Progress Notes (Signed)
Set pt up on CPAP for night rest tolerating well.

## 2022-12-24 NOTE — Assessment & Plan Note (Signed)
Patient's edema is reportedly worse over the last 3 weeks as per wife and patient.  It is most consistent in a pattern of fluid overload, however we will we will do lower extremity Dopplers to make sure were not dealing with a occult DVT situation.  I will give empiric Lasix 40 mg today, check daily weights intake and output, start the patient on p.o. Lasix daily.  Please note apparently patient is on 160 mg of Lasix twice a day, which is a little bit of an atypical dose, therefore I will start with 40 till we actually verify what the patient takes at home with pill bottles.  I will check urine protein with creatinine ratio, congestive heart failure screening with echo, troponin, EKG.  Check LFTs and BMP today

## 2022-12-25 ENCOUNTER — Inpatient Hospital Stay (HOSPITAL_COMMUNITY): Payer: Managed Care, Other (non HMO)

## 2022-12-25 DIAGNOSIS — M7989 Other specified soft tissue disorders: Secondary | ICD-10-CM

## 2022-12-25 DIAGNOSIS — I5021 Acute systolic (congestive) heart failure: Secondary | ICD-10-CM | POA: Diagnosis not present

## 2022-12-25 DIAGNOSIS — R627 Adult failure to thrive: Secondary | ICD-10-CM

## 2022-12-25 LAB — GLUCOSE, CAPILLARY
Glucose-Capillary: 121 mg/dL — ABNORMAL HIGH (ref 70–99)
Glucose-Capillary: 152 mg/dL — ABNORMAL HIGH (ref 70–99)
Glucose-Capillary: 131 mg/dL — ABNORMAL HIGH (ref 70–99)
Glucose-Capillary: 92 mg/dL (ref 70–99)

## 2022-12-25 LAB — ECHOCARDIOGRAM COMPLETE
S' Lateral: 4.9 cm
Weight: 5520 oz

## 2022-12-25 LAB — HIV ANTIBODY (ROUTINE TESTING W REFLEX): HIV Screen 4th Generation wRfx: NONREACTIVE

## 2022-12-25 LAB — BASIC METABOLIC PANEL
Anion gap: 9 (ref 5–15)
BUN: 24 mg/dL — ABNORMAL HIGH (ref 6–20)
CO2: 31 mmol/L (ref 22–32)
Calcium: 9 mg/dL (ref 8.9–10.3)
Chloride: 97 mmol/L — ABNORMAL LOW (ref 98–111)
Creatinine, Ser: 1.38 mg/dL — ABNORMAL HIGH (ref 0.61–1.24)
Glucose, Bld: 122 mg/dL — ABNORMAL HIGH (ref 70–99)
Potassium: 3.5 mmol/L (ref 3.5–5.1)
Sodium: 137 mmol/L (ref 135–145)

## 2022-12-25 LAB — HEMOGLOBIN A1C
Hgb A1c MFr Bld: 6.3 % — ABNORMAL HIGH (ref 4.8–5.6)
Mean Plasma Glucose: 134.11 mg/dL

## 2022-12-25 MED ORDER — PERFLUTREN LIPID MICROSPHERE
1.0000 mL | INTRAVENOUS | Status: AC | PRN
  Administered 2022-12-25: 4 mL via INTRAVENOUS

## 2022-12-25 MED ORDER — FUROSEMIDE 10 MG/ML IJ SOLN
40.0000 mg | Freq: Two times a day (BID) | INTRAMUSCULAR | Status: DC
  Administered 2022-12-26 – 2022-12-28 (×6): 40 mg via INTRAVENOUS
  Filled 2022-12-25 (×6): qty 4

## 2022-12-25 MED ORDER — ENOXAPARIN SODIUM 40 MG/0.4ML IJ SOSY
40.0000 mg | PREFILLED_SYRINGE | Freq: Two times a day (BID) | INTRAMUSCULAR | Status: DC
  Administered 2022-12-25 – 2023-01-12 (×36): 40 mg via SUBCUTANEOUS
  Filled 2022-12-25 (×35): qty 0.4

## 2022-12-25 NOTE — Progress Notes (Signed)
   12/25/22 2100  BiPAP/CPAP/SIPAP  BiPAP/CPAP/SIPAP Pt Type Adult (Patient prefers self placement when ready for bed.)  BiPAP/CPAP/SIPAP DREAMSTATIOND  Mask Type Full face mask  Mask Size Large  FiO2 (%) 21 %  Patient Home Equipment No  Auto Titrate Yes

## 2022-12-25 NOTE — Consult Note (Signed)
ORTHOPAEDIC CONSULTATION  REQUESTING PHYSICIAN: Jerald Kief, MD  Chief Complaint: failure to thrive in adult  HPI: Raymond [name] is a 57 y.o. male who complains of an insidious onset of worsening bilateral knee weakness for 1 month. He has known severe OA of both knees and has been treated with repeated cortisone and gel injections in our office which typically help with his pain. This weakness is new however. It has caused multiple falls in the past month and several ER visits. A fall last week resulted in a right distal fibula fracture. Since it appeared minimally displaced and he has multiple high risk co-morbidities, we would like to treat this non-operatively at all costs. He presented to our office yesterday with his wife but was unable to get out of the car and come inside. He reports he hasn't been able to walk for over a week now. Has been very immobile. He hasn't been able to get up and use the bathroom or care for himself. His wife is unable to physically lift him and feels very overwhelmed.   Imaging shows slight worsening of the alignment of the distal fibula fracture components on 12/24/22 when compared to previous images.     No history of MI, CVA, DVT, PE.  Previously ambulatory before this weakness began 1 month ago.  The patient is living at home with his wife. .    Past Medical History:  Diagnosis Date   Anxiety    COPD (chronic obstructive pulmonary disease) (HCC)    Diabetes mellitus without complication (HCC)    Edema    GERD (gastroesophageal reflux disease)    Gout    Heavy smoker    Hyperlipidemia    Hypertension    Sleep apnea    uses a cpap   Past Surgical History:  Procedure Laterality Date   CHONDROPLASTY Left 06/28/2014   Procedure: CHONDROPLASTY;  Surgeon: Thera Flake., MD;  Location: [facility];  Service: Orthopedics;  Laterality: Left;   COLONOSCOPY     FOOT FASCIOTOMY     age 6-rt   KNEE ARTHROSCOPY WITH LATERAL  MENISECTOMY Left 06/28/2014   Procedure: KNEE ARTHROSCOPY WITH LATERAL MENISECTOMY;  Surgeon: Thera Flake., MD;  Location: [facility];  Service: Orthopedics;  Laterality: Left;   KNEE ARTHROSCOPY WITH MEDIAL MENISECTOMY Left 06/28/2014   Procedure: LEFT KNEE ARTHROSCOPY CHONDROPLASTY/WITH MEDIAL/LATERAL MENISECTOMIES;  Surgeon: Thera Flake., MD;  Location: [facility];  Service: Orthopedics;  Laterality: Left;   ORIF RADIUS & ULNA FRACTURES  2007   left   Social History   Socioeconomic History   Marital status: Single    Spouse name: Not on file   Number of children: Not on file   Years of education: Not on file   Highest education level: Not on file  Occupational History   Not on file  Tobacco Use   Smoking status: Every Day    Current packs/day: 2.00    Types: Cigarettes   Smokeless tobacco: Never  Vaping Use   Vaping status: Not on file  Substance and Sexual Activity   Alcohol use: Yes    Comment: rare   Drug use: No   Sexual activity: Not on file  Other Topics Concern   Not on file  Social History Narrative   Not on file   Social Determinants of Health   Financial Resource Strain: Not on file  Food Insecurity: No Food Insecurity (  12/24/2022)   Hunger Vital Sign    Worried About Running Out of Food in the Last Year: Never true    Ran Out of Food in the Last Year: Never true  Transportation Needs: No Transportation Needs (12/24/2022)   PRAPARE - Administrator, Civil Service (Medical): No    Lack of Transportation (Non-Medical): No  Physical Activity: Not on file  Stress: No Stress Concern Present (03/21/2022)   Received from [facility], [facility] of Occupational Health - Occupational Stress Questionnaire    Feeling of Stress : Not at all  Social Connections: Unknown (01/10/2022)   Received from [facility], [facility]   Social Network    Social Network: Not on file   Family History   Problem Relation Age of Onset   Diabetes Mother    Hypertension Mother    Diabetes Father    Hypertension Father    Cancer Other    Allergies  Allergen Reactions   Sulfa Antibiotics Hives   Prior to Admission medications   Medication Sig Start Date End Date Taking? Authorizing Provider  albuterol (PROVENTIL) (2.5 MG/3ML) 0.083% nebulizer solution Inhale 3 mLs into the lungs every 4 (four) hours as needed for wheezing or shortness of breath. 09/06/16  Yes Rolan Bucco, MD  allopurinol (ZYLOPRIM) 300 MG tablet Take 300 mg by mouth daily.   Yes [provider]  ALPRAZolam Prudy Feeler) 0.5 MG tablet Take 0.5 mg by mouth at bedtime as needed for anxiety.   Yes [provider]  amLODipine-benazepril (LOTREL) 5-20 MG per capsule Take 1 capsule by mouth 2 (two) times daily. 10/03/14  Yes [provider]  atorvastatin (LIPITOR) 40 MG tablet Take 40 mg by mouth daily.   Yes [provider]  bumetanide (BUMEX) 2 MG tablet Take 4 mg by mouth 2 (two) times daily.   Yes [provider]  carvedilol (COREG) 25 MG tablet Take 25 mg by mouth 2 (two) times daily with a meal.   Yes [provider]  doxazosin (CARDURA) 2 MG tablet Take 2 mg by mouth at bedtime. 07/25/14  Yes [provider]  DULoxetine (CYMBALTA) 60 MG capsule Take 60 mg by mouth daily.   Yes [provider]  glipiZIDE (GLUCOTROL) 10 MG tablet Take 10 mg by mouth daily before breakfast.   Yes [provider]  lisinopril (PRINIVIL,ZESTRIL) 20 MG tablet Take 20 mg by mouth 2 (two) times daily.   Yes [provider]  losartan (COZAAR) 50 MG tablet Take 50 mg by mouth daily.   Yes [provider]  meloxicam (MOBIC) 15 MG tablet Take 15 mg by mouth daily.   Yes [provider]  metFORMIN (GLUCOPHAGE-XR) 500 MG 24 hr tablet Take 1,000 mg by mouth daily with breakfast.  09/27/14  Yes [provider]  nystatin-triamcinolone (MYCOLOG II) cream  Apply 1 application topically daily as needed (itch).  08/06/15  Yes [provider]  omeprazole (PRILOSEC) 40 MG capsule Take 40 mg by mouth daily.   Yes [provider]  oxyCODONE (ROXICODONE) 5 MG immediate release tablet Take 0.5 or 1 tablet (2.5-5 mg) every 4 to 6 hours as needed for severe pain Patient taking differently: Take 5 mg by mouth every 6 (six) hours as needed for moderate pain. 12/06/22  Yes Harris, Abigail, PA-C  potassium chloride SA (K-DUR,KLOR-CON) 20 MEQ tablet Take 30 mEq by mouth 2 (two) times daily.   Yes [provider]  pramipexole (  MIRAPEX) 0.5 MG tablet Take 0.5 mg by mouth daily.   Yes [provider]  hydrOXYzine (ATARAX/VISTARIL) 25 MG tablet Take 1 tablet (25 mg total) by mouth every 6 (six) hours. Patient not taking: Reported on 12/24/2022 08/12/15   Rolland Porter, MD  predniSONE (DELTASONE) 20 MG tablet 2 by mouth daily 5 days, then one by mouth daily 5 days. Patient not taking: Reported on 12/24/2022 08/12/15   Rolland Porter, MD   ECHOCARDIOGRAM COMPLETE  Result Date: 12/25/2022    ECHOCARDIOGRAM REPORT   Patient Name:   [place] Salena Saner Prouse Date of Exam: 12/25/2022 Medical Rec #:  [id number]   Height:       71.0 in Accession #:    [id number]  Weight:       345.0 lb Date of Birth:  11/06/1965    BSA:          2.658 m Patient Age:    57 years    BP:           171/94 mmHg Patient Gender: M           HR:           100 bpm. Exam Location:  Inpatient Procedure: 2D Echo, Cardiac Doppler, Color Doppler and Intracardiac            Opacification Agent Indications:    I50.21 Acute systolic (congestive) heart failure  History:        Patient has prior history of Echocardiogram examinations, most                 recent 10/19/2014. COPD; Risk Factors:Diabetes, Dyslipidemia and                 Hypertension.  Sonographer:    Harriette Bouillon RDCS Referring Phys: [id number] [facility] GOEL IMPRESSIONS  1. Technically difficult study with very limited visualization of cardiac  structures.  2. Left ventricular ejection fraction, by estimation, is 55 to 60%. The left ventricle has normal function. The left ventricle has no regional wall motion abnormalities. Left ventricular diastolic parameters were normal.  3. Right ventricular systolic function is normal. The right ventricular size is normal.  4. The mitral valve was not well visualized. No evidence of mitral valve regurgitation. No evidence of mitral stenosis.  5. The aortic valve was not well visualized. Aortic valve regurgitation is not visualized. No aortic stenosis is present.  6. The inferior vena cava is dilated in size with <50% respiratory variability, suggesting right atrial pressure of 15 mmHg. FINDINGS  Left Ventricle: Left ventricular ejection fraction, by estimation, is 55 to 60%. The left ventricle has normal function. The left ventricle has no regional wall motion abnormalities. Definity contrast agent was given IV to delineate the left ventricular  endocardial borders. The left ventricular internal cavity size was normal in size. There is no left ventricular hypertrophy. Left ventricular diastolic parameters were normal. Right Ventricle: The right ventricular size is normal. No increase in right ventricular wall thickness. Right ventricular systolic function is normal. Left Atrium: Left atrial size was normal in size. Right Atrium: Right atrial size was normal in size. Pericardium: There is no evidence of pericardial effusion. Mitral Valve: The mitral valve was not well visualized. No evidence of mitral valve regurgitation. No evidence of mitral valve stenosis. Tricuspid Valve: The tricuspid valve is not well visualized. Tricuspid valve regurgitation is not demonstrated. No evidence of tricuspid stenosis. Aortic Valve: The aortic valve was not well visualized. Aortic valve regurgitation is not visualized.  No aortic stenosis is present. Pulmonic Valve: The pulmonic valve was not assessed. Pulmonic valve regurgitation is  not visualized. No evidence of pulmonic stenosis. Aorta: The aortic root is normal in size and structure. Venous: The inferior vena cava is dilated in size with less than 50% respiratory variability, suggesting right atrial pressure of 15 mmHg. IAS/Shunts: No atrial level shunt detected by color flow Doppler.  LEFT VENTRICLE PLAX 2D LVIDd:         6.10 cm LVIDs:         4.90 cm LV PW:         1.10 cm LV IVS:        1.10 cm LVOT diam:     2.30 cm LV SV:         106 LV SV Index:   40 LVOT Area:     4.15 cm  IVC IVC diam: 3.00 cm LEFT ATRIUM              Index        RIGHT ATRIUM           Index LA diam:        4.60 cm  1.73 cm/m   RA Area:     12.50 cm LA Vol (A2C):   75.8 ml  28.51 ml/m  RA Volume:   25.90 ml  9.74 ml/m LA Vol (A4C):   112.0 ml 42.13 ml/m LA Biplane Vol: 97.3 ml  36.60 ml/m  AORTIC VALVE LVOT Vmax:   124.00 cm/s LVOT Vmean:  78.200 cm/s LVOT VTI:    0.256 m  AORTA Ao Root diam: 3.10 cm Ao Asc diam:  3.00 cm  SHUNTS Systemic VTI:  0.26 m Systemic Diam: 2.30 cm Aditya Sabharwal Electronically signed by Dorthula Nettles Signature Date/Time: 12/25/2022/3:43:25 PM    Final    CT HEAD WO CONTRAST ( )  Result Date: 12/25/2022 CLINICAL DATA:  Head trauma, abnormal mental status (Age 72-64y) EXAM: CT HEAD WITHOUT CONTRAST TECHNIQUE: Contiguous axial images were obtained from the base of the skull through the vertex without intravenous contrast. RADIATION DOSE REDUCTION: This exam was performed according to the departmental dose-optimization program which includes automated exposure control, adjustment of the mA and/or kV according to patient size and/or use of iterative reconstruction technique. COMPARISON:  None Available. FINDINGS: Brain: No evidence of acute infarction, hemorrhage, hydrocephalus, extra-axial collection or mass lesion/mass effect. Vascular: No hyperdense vessel. Skull: No acute fracture. Sinuses/Orbits: Moderate left mastoid effusion. Otherwise, clear sinuses. No acute findings.  Other: No mastoid effusions. IMPRESSION: No evidence of acute intracranial abnormality. Electronically Signed   By: Feliberto Harts M.D.   On: 12/25/2022 15:37   VAS [place] LOWER EXTREMITY VENOUS (DVT)  Result Date: 12/25/2022  Lower Venous DVT Study Patient Name:  Jamall Salena Saner Signer  Date of Exam:   12/25/2022 Medical Rec #: [id number]    Accession #:    [id number] Date of Birth: [birth year]     Patient Gender: M Patient Age:   60 years Exam Location[facility]  [facility] Procedure:      VAS [place] LOWER EXTREMITY VENOUS (DVT) Referring Phys: [facility] GOEL --------------------------------------------------------------------------------  Indications: Bilateral lower extremity edema, acute on chronic, s/p falls and right ankle fracture.  Limitations: Poor ultrasound/tissue interface, body habitus and involuntary patient movement. Comparison Study: 07-09-2021 Bilateral lower extremity venous study was negative                   for DVT. Performing Technologist: Jean Rosenthal RDMS,  RVT  Examination Guidelines: A complete evaluation includes B-mode imaging, spectral Doppler, color Doppler, and power Doppler as needed of all accessible portions of each vessel. Bilateral testing is considered an integral part of a complete examination. Limited examinations for reoccurring indications Heuring be performed as noted. The reflux portion of the exam is performed with the patient in reverse Trendelenburg.  +---------+---------------+---------+-----------+----------+-------------------+ RIGHT    CompressibilityPhasicitySpontaneityPropertiesThrombus Aging      +---------+---------------+---------+-----------+----------+-------------------+ CFV      Full           Yes      Yes                                      +---------+---------------+---------+-----------+----------+-------------------+ SFJ      Full                                                              +---------+---------------+---------+-----------+----------+-------------------+ FV Prox  Full                                                             +---------+---------------+---------+-----------+----------+-------------------+ FV Mid   Full                                                             +---------+---------------+---------+-----------+----------+-------------------+ FV Distal               Yes      Yes                                      +---------+---------------+---------+-----------+----------+-------------------+ PFV      Full                                                             +---------+---------------+---------+-----------+----------+-------------------+ POP      Full           Yes      Yes                                      +---------+---------------+---------+-----------+----------+-------------------+ PTV      Full                                                             +---------+---------------+---------+-----------+----------+-------------------+ PERO  Not well visualized +---------+---------------+---------+-----------+----------+-------------------+   +---------+---------------+---------+-----------+----------+--------------+ LEFT     CompressibilityPhasicitySpontaneityPropertiesThrombus Aging +---------+---------------+---------+-----------+----------+--------------+ CFV      Full           Yes      Yes                                 +---------+---------------+---------+-----------+----------+--------------+ SFJ      Full                                                        +---------+---------------+---------+-----------+----------+--------------+ FV Prox  Full                                                        +---------+---------------+---------+-----------+----------+--------------+ FV Mid   Full                                                         +---------+---------------+---------+-----------+----------+--------------+ FV DistalFull                                                        +---------+---------------+---------+-----------+----------+--------------+ PFV      Full                                                        +---------+---------------+---------+-----------+----------+--------------+ POP      Full           Yes      Yes                                 +---------+---------------+---------+-----------+----------+--------------+ PTV      Full                                                        +---------+---------------+---------+-----------+----------+--------------+ PERO     Full                                                        +---------+---------------+---------+-----------+----------+--------------+    Summary: RIGHT: - There is no evidence of deep vein thrombosis in the lower extremity. However, portions of this examination were limited- see technologist comments above.  - A heterogenous collection is found in the popliteal fossa.  LEFT: - There is no evidence of deep vein thrombosis in the lower extremity.  - No cystic structure found in the popliteal fossa.  *See table(s) above for measurements and observations.    Preliminary    DG FEMUR PORT, MIN 2 VIEWS RIGHT  Result Date: 12/24/2022 CLINICAL DATA:  Trauma, frequent falls EXAM: RIGHT FEMUR PORTABLE 2 VIEW COMPARISON:  None Available. FINDINGS: No recent displaced fracture is seen in right femur. Degenerative changes are noted in right knee. Effusion is seen in suprapatellar bursa in the right knee. IMPRESSION: No recent fracture or dislocation is seen in right femur. Degenerative changes and effusion are noted in right knee. Electronically Signed   By: Ernie Avena M.D.   On: 12/24/2022 20:11   DG Foot 2 Views Right  Result Date: 12/24/2022 CLINICAL DATA:  Trauma, frequent falls EXAM: RIGHT  FOOT - 2 VIEW COMPARISON:  None Available. FINDINGS: Fracture is seen distal fibula. No other definite recent fracture is noted. Deformity in the shaft of fifth metatarsal suggests old healed fracture. There is soft tissue swelling over the dorsum. IMPRESSION: Fracture is seen in distal right fibula. No other recent fractures are seen. Old healed fracture is seen in right fifth metatarsal. Electronically Signed   By: Ernie Avena M.D.   On: 12/24/2022 20:10   DG KNEE 3 VIEW RIGHT  Result Date: 12/24/2022 CLINICAL DATA:  Trauma, frequent falls EXAM: RIGHT KNEE - 3 VIEW COMPARISON:  None Available. FINDINGS: No recent fracture or dislocation is seen. Marked degenerative changes are noted with bony spurs in medial, lateral and patellofemoral compartments. Medial tibial plateau is slightly lower in comparison to the lateral tibial plateau. Sclerotic changes are noted in the medial tibial plateau. There is no demonstrable break in the cortical margins. There is soft tissue fullness in suprapatellar bursa suggesting effusion. There is edema in subcutaneous plane. IMPRESSION: No definite recent fracture or dislocation is seen. Medial tibial plateau shows sclerosis and is slightly lower in position in relation to the lateral tibial plateau. These changes Montminy be due to previous injury and degenerative arthritis. If there are continued symptoms, follow-up CT [place] be considered. Possible moderate effusion is seen in suprapatellar bursa. Electronically Signed   By: Ernie Avena M.D.   On: 12/24/2022 20:08   DG Tibia/Fibula Right  Result Date: 12/24/2022 CLINICAL DATA:  Trauma, frequent falls EXAM: RIGHT TIBIA AND FIBULA - 2 VIEW COMPARISON:  None Available. FINDINGS: There is oblique fracture in the distal metaphyseal region and distal shaft of right fibula. There is 3 mm offset in alignment of lateral cortical margins of the fracture the no other fracture is seen. Degenerative changes are noted in right  knee. There is diffuse edema in subcutaneous plane in right lower leg. IMPRESSION: Recent, slightly displaced fracture is seen in the distal right fibula. Electronically Signed   By: Ernie Avena M.D.   On: 12/24/2022 20:05   DG Thoracic Spine 2 View  Result Date: 12/24/2022 CLINICAL DATA:  Frequent falls EXAM: THORACIC SPINE 2 VIEWS COMPARISON:  None Available. FINDINGS: Lateral view is technically less than optimal, possibly due to patient's body habitus. In the AP view, there is no definite decrease in height of thoracic vertebral bodies. Degenerative changes are noted with disc space narrowing and bony spurs in lower thoracic spine. Paraspinal soft tissues are unremarkable. IMPRESSION: Technically limited study.  No definite recent fracture is seen. Electronically Signed   By: Ernie Avena M.D.   On: 12/24/2022 20:03   DG  Lumbar Spine 2-3 Views  Result Date: 12/24/2022 CLINICAL DATA:  Frequent falls EXAM: LUMBAR SPINE - 2-3 VIEW COMPARISON:  None Available. FINDINGS: Examination is technically difficult due to patient's body habitus. Lateral views are less than optimal. As far as seen, no definite recent fracture is seen. Alignment of posterior margins of vertebral bodies appears intact. Paraspinal soft tissues are unremarkable. IMPRESSION: Technically limited study.  No definite recent fracture is seen. Electronically Signed   By: Ernie Avena M.D.   On: 12/24/2022 20:02    Positive ROS: All other systems have been reviewed and were otherwise negative with the exception of those mentioned in the HPI and as above.  Objective: Labs cbc Recent Labs    12/24/22 1647  WBC 12.1*  HGB 11.2*  HCT 35.1*  PLT 292    Labs inflam No results for input(s): "CRP" in the last 72 hours.  Invalid input(s): "ESR"  Labs coag Recent Labs    12/24/22 1647  INR 1.0    Recent Labs    12/24/22 1647 12/25/22 0525  NA 138 137  K 3.8 3.5  CL 100 97*  CO2 27 31  GLUCOSE 113*  122*  BUN 23* 24*  CREATININE 1.62* 1.38*  CALCIUM 9.0 9.0    Physical Exam: Vitals:   12/25/22 0457 12/25/22 1337  BP: (!) 171/94 (!) 198/119  Pulse: 70 (!) 58  Resp: 18 13  Temp: 97.8 F (36.6 C) 97.8 F (36.6 C)  SpO2: 93% 96%   General: Alert, no acute distress.  Morbidly obese male. Laying supine in bed, calm. Legs not elevated Mental status: Alert and Oriented x3 Neurologic: Speech Clear and organized, no gross focal findings or movement disorder appreciated. Respiratory: No cyanosis, no use of accessory musculature Cardiovascular: no m/r/g GI: Abdomen is soft and non-tender, non-distended. Skin: Warm, B/L lower leg erythema and wounds to the lateral compartment of right lower leg, one spot has some serosanguinous drainage Extremities: severe pitting edema B/L legs Psychiatric: Patient is competent for consent with normal mood and affect  MUSCULOSKELETAL:  TTP lateral malleolus of right ankle, very limited ankle ROM tolerated d/t pain, can move toes some, ecchymosis present to ankle and foot, severe pitting edema present  Other extremities are atraumatic with painless ROM and NVI.  Assessment / Plan: Principal Problem:   Failure to thrive in adult Active Problems:   Edema of extremities   Type 2 diabetes mellitus, without long-term current use of insulin (HCC)   Tobacco abuse    Difficult situation as patient has severe B/L knee OA but has not lost enough weight to meet the BMI cut off of 40 to qualify for surgery. New knee weakness causing falls resulting in the right ankle fracture.   Although it has become slightly more displaced, we are still wanting to treat this non-operatively because of the extreme risks of doing surgery on him in this state.   His lower leg edema does not seem to be improving despite the use of Lasix. He has no SCDs or compression socks on currently and his legs are not currently elevated at all. None of this is helpful to his situation.  Recommend elevating the legs especially the right ankle to help with pain and swelling. Might be worth giving antibiotics for possible cellulitis of the lower legs too as his redness is very TTP and he has a wound oozing slightly.   We encourage working with PT/OT to get stronger and able to mobilize again. He certainly  cannot return home in this same state as he will continue to rapidly decline.     Weightbearing: WBAT RLE in CAM boot, NWB RLE if out of boot Orthopedic device(s): CAM boot Showering: ok VTE prophylaxis:  per primary   , needs some kind of compression on B/L lower legs to help with swelling, ambulation Pain control: PRN Follow - up plan: 2 weeks to check ankle Contact information:  Margarita Rana MD, [facility] PA-C   Jenne Pane PA-C Office [phone] 12/25/2022 4:30 PM

## 2022-12-25 NOTE — Progress Notes (Signed)
Progress Note   Patient: Raymond [name] HKV:[id number] DOB: [birth year] DOA: 12/24/2022     1 DOS: the patient was seen and examined on 12/25/2022   Brief hospital course: 57 y.o. male with medical history significant of obesity and other medical problems as listed below.  Patient was in his usual state of health till approximately December 06, 2022 when patient describes having chronic leg weakness with buckling of legs below him.  Patient actually presented to ER affiliated with [facility] health.  An x-ray of the right ankle revealed acute fracture of the lateral malleolus.  I am advised by the wife and patient that there is no skin injury overlying the fracture.  Patient was treated in the ER and discharged home with a brace.  Patient subsequently followed up with outpatient orthopedic surgeon on December 14, 2022.  I do not have access to office records from the orthopedic visit on December 14, 2022.  However the wife advises that the plan was for serial x-ray monitoring of the fracture, conservative management with a brace for about 6 to 8 weeks.   Unfortunately on December 21, 2022, 3 days ago, patient had another fall that is attributed to instability of gait due to right ankle fracture.  Patient has since then been reporting numbness on the right lateral thigh.  Patient does not report any new focal weakness since then nor any back pain or bladder or bowel changes.  However patient does report having progressive leg swelling bilateral since then.  Which is worse than his chronic leg swelling.  Patient was evaluated at ER of [facility] on December 21, 2022 subsequent to his fall.  Patient is also evaluated at the same time on December 21, 2022 for right knee pain.  Patient had x-ray testing done of areas of concern, no new acute fracture was found, patient was discharged home.   However wife describes that the patient has pretty much been bedbound since his discharge from [facility] ER on December 21, 2022.  Patient is not able to bear weight on the  right lower extremity.  Patient ascribes this to right knee pain.  Patient was seen at outpatient orthopedic clinic today, and given his poor functional status, transferred to [facility] inpatient at [facility] long.   Patient is now seen at [facility] long hospital in the inpatient unit on a telemetry bed.  As mentioned above patient has developed worsening lower extremity swelling over the last 2 to 3 weeks.  There is no chest pain or shortness of breath.  Patient has developed progressive worsening gait instability with pretty much bedbound status for the last 3 days.  Assessment and Plan: Displaced R Fibular fracture -recent fall with lateral malleolar fracture now with slighly displaced fibular fx -Cont with analgesia as tolerated -Have consulted Orthopedic Surgery. Recommendations for conservative management and PT noted -Pt did report fall with injury to head and back. Ordered and reviewed CT thoracic, lumbar spine and CT head. Unremarkable -PT/OT following  Tobacco abuse -cessation done this visit -Continue with nicotine patch as needed   Type 2 diabetes mellitus, without long-term current use of insulin (HCC) -cont hold oral meds while in hospital -cont with SSI as needed   Anasarca with BLE edema -Patient's edema is reportedly worse over the last 3 weeks as per wife and patient.   -LE dopplers reviewed, neg for DVT -Currently on PO lasix. Will transition to 40mg  BID IV lasix -Monitor I/O and daily wts -2d echo reviewed, normal LVEF with normal  RF function      Subjective: Complaining of back pain and difficulty ambulating  Physical Exam: Vitals:   12/24/22 2227 12/24/22 2341 12/25/22 0457 12/25/22 1337  BP: (!) 185/87 (!) 177/63 (!) 171/94 (!) 198/119  Pulse: 98 92 70 (!) 58  Resp: 16 18 18 13   Temp:  98.3 F (36.8 C) 97.8 F (36.6 C) 97.8 F (36.6 C)  TempSrc:  Oral Oral Oral  SpO2: 94% 94% 93% 96%  Weight:      Height:       General exam: Awake, laying in bed, in  nad Respiratory system: Normal respiratory effort, no wheezing Cardiovascular system: regular rate, s1, s2 Gastrointestinal system: Soft, nondistended, positive BS Central nervous system: CN2-12 grossly intact, strength intact Extremities: BLE edema L>R Skin: Normal skin turgor, no notable skin lesions seen Psychiatry: Mood normal // no visual hallucinations   Data Reviewed:  Labs reviewed: Na 137, K 3.5, Cr 1.38,   Family Communication: Pt in room, family at bedside  Disposition: Status is: Inpatient Remains inpatient appropriate because: severity of illness  Planned Discharge Destination:  Pending PT eval     Author: Rickey Barbara, MD 12/25/2022 7:19 PM  For on call review [email or web address].

## 2022-12-25 NOTE — Progress Notes (Signed)
Lower extremity venous bilateral study completed.   Please see CV Proc for preliminary results.   Rachel Hodge, RDMS, RVT  

## 2022-12-25 NOTE — Progress Notes (Signed)
  Echocardiogram 2D Echocardiogram has been performed.  Leda Roys RDCS 12/25/2022, 11:16 AM

## 2022-12-25 NOTE — Hospital Course (Addendum)
Raymond [name] is a 57 y.o. male with a history of obesity, hypertension, CKD, tobacco use, diabetes mellitus.  Patient presented secondary to poor functional status and worsening lower extremity edema.  Patient seen by orthopedic surgery with recommendation for conservative management of right fibril fracture.  Patient underwent diuresis for lower extremity edema.  Patient evaluated by physical and Occupational Therapy with recommendation for SNF on discharge.

## 2022-12-25 NOTE — Progress Notes (Signed)
   12/25/22 1624  TOC Brief Assessment  Insurance and Status Reviewed  Patient has primary care physician Yes  Home environment has been reviewed Yes  Prior level of function: Independent  Prior/Current Home Services No current home services  Social Determinants of Health Reivew SDOH reviewed no interventions necessary  Readmission risk has been reviewed Yes  Transition of care needs no transition of care needs at this time

## 2022-12-25 NOTE — Progress Notes (Signed)
PT Cancellation Note  Patient Details Name: Raymond [name] MRN: [id number] DOB: 12-20-1965   Cancelled Treatment:     PT order received but eval deferred at request of hospitalist pending ortho consult.  Will follow   , 12/25/2022, 12:46 PM

## 2022-12-26 DIAGNOSIS — R627 Adult failure to thrive: Secondary | ICD-10-CM | POA: Diagnosis not present

## 2022-12-26 LAB — GLUCOSE, CAPILLARY
Glucose-Capillary: 135 mg/dL — ABNORMAL HIGH (ref 70–99)
Glucose-Capillary: 130 mg/dL — ABNORMAL HIGH (ref 70–99)
Glucose-Capillary: 129 mg/dL — ABNORMAL HIGH (ref 70–99)
Glucose-Capillary: 136 mg/dL — ABNORMAL HIGH (ref 70–99)

## 2022-12-26 LAB — MAGNESIUM: Magnesium: 1.8 mg/dL (ref 1.7–2.4)

## 2022-12-26 MED ORDER — METHOCARBAMOL 500 MG PO TABS
500.0000 mg | ORAL_TABLET | Freq: Once | ORAL | Status: AC
  Administered 2022-12-26: 500 mg via ORAL
  Filled 2022-12-26: qty 1

## 2022-12-26 MED ORDER — POTASSIUM CHLORIDE CRYS ER 20 MEQ PO TBCR
40.0000 meq | EXTENDED_RELEASE_TABLET | ORAL | Status: AC
  Administered 2022-12-26 (×2): 40 meq via ORAL
  Filled 2022-12-26 (×2): qty 2

## 2022-12-26 NOTE — Plan of Care (Signed)

## 2022-12-26 NOTE — Progress Notes (Signed)
Pt c/o 10/10 pain and cramping in lower extremities, states that his legs are moving out of his control, legs remain very swollen and edematous, warm to touch.  Liana Crocker NP made aware, new orders received and implemented.  Pt found to have legs down and continuing to state that is legs are going to "bust open" with fluid.  Explained to patient pathophysiology of fluid overload and importance to keep legs elevated to encourage fluid to return to the heart and circulation.  Pt very frustrated and refuses to put legs up in bed, even after explaining that this will make his leg pain worst.  Will continue to monitor.

## 2022-12-26 NOTE — Progress Notes (Signed)
Progress Note   Patient: Raymond [name]:[id number] DOB: Tague 17, 1967 DOA: 12/24/2022     2 DOS: the patient was seen and examined on 12/26/2022   Brief hospital course: 57 y.o. male with medical history significant of obesity and other medical problems as listed below.  Patient was in his usual state of health till approximately December 06, 2022 when patient describes having chronic leg weakness with buckling of legs below him.  Patient actually presented to ER affiliated with [facility] health.  An x-ray of the right ankle revealed acute fracture of the lateral malleolus.  I am advised by the wife and patient that there is no skin injury overlying the fracture.  Patient was treated in the ER and discharged home with a brace.  Patient subsequently followed up with outpatient orthopedic surgeon on December 14, 2022.  I do not have access to office records from the orthopedic visit on December 14, 2022.  However the wife advises that the plan was for serial x-ray monitoring of the fracture, conservative management with a brace for about 6 to 8 weeks.   Unfortunately on December 21, 2022, 3 days ago, patient had another fall that is attributed to instability of gait due to right ankle fracture.  Patient has since then been reporting numbness on the right lateral thigh.  Patient does not report any new focal weakness since then nor any back pain or bladder or bowel changes.  However patient does report having progressive leg swelling bilateral since then.  Which is worse than his chronic leg swelling.  Patient was evaluated at ER of [facility] on December 21, 2022 subsequent to his fall.  Patient is also evaluated at the same time on December 21, 2022 for right knee pain.  Patient had x-ray testing done of areas of concern, no new acute fracture was found, patient was discharged home.   However wife describes that the patient has pretty much been bedbound since his discharge from [facility] ER on December 21, 2022.  Patient is not able to bear weight on the  right lower extremity.  Patient ascribes this to right knee pain.  Patient was seen at outpatient orthopedic clinic today, and given his poor functional status, transferred to [facility] inpatient at [facility] long.   Patient is now seen at [facility] long hospital in the inpatient unit on a telemetry bed.  As mentioned above patient has developed worsening lower extremity swelling over the last 2 to 3 weeks.  There is no chest pain or shortness of breath.  Patient has developed progressive worsening gait instability with pretty much bedbound status for the last 3 days.  Assessment and Plan: Displaced R Fibular fracture -recent fall with lateral malleolar fracture now with slighly displaced fibular fx -Cont with analgesia as tolerated -Have consulted Orthopedic Surgery. Recommendations for conservative management and PT noted -Pt did report fall with injury to head and back. Ordered and reviewed CT thoracic, lumbar spine and CT head. Unremarkable -PT/OT is following. Recs for SNF noted   Tobacco abuse -cessation done this visit -Continue with nicotine patch as needed   Type 2 diabetes mellitus, without long-term current use of insulin (HCC) -cont hold oral meds while in hospital -cont with SSI as needed   Anasarca with BLE edema -Patient's edema is reportedly worse over the last 3 weeks as per wife and patient.   -LE dopplers reviewed, neg for DVT -Monitor I/O and daily wts -2d echo reviewed, normal LVEF with normal RF function -very good results  with transition to 40mg  IV BID lasix -ordered TEDS  Hypokalemia -replaced     Subjective: Reports feeling somewhat better today  Physical Exam: Vitals:   12/25/22 2109 12/26/22 0511 12/26/22 1001 12/26/22 1432  BP: (!) 175/92 (!) 169/82  (!) 168/130  Pulse: 84 (!) 59  76  Resp: 18 18  20   Temp: 97.8 F (36.6 C) 97.9 F (36.6 C)  97.6 F (36.4 C)  TempSrc: Oral Oral  Oral  SpO2: 95% 95%  96%  Weight:   (!) 140.8 kg   Height:       General  exam: Conversant, in no acute distress Respiratory system: normal chest rise, clear, no audible wheezing Cardiovascular system: regular rhythm, s1-s2 Gastrointestinal system: Nondistended, nontender, pos BS Central nervous system: No seizures, no tremors Extremities: No cyanosis, no joint deformities, BLE edema Skin: No rashes, no pallor Psychiatry: Affect normal // no auditory hallucinations   Data Reviewed:  Labs reviewed: Na 135, K 3.1, Cr 1.25, WBC 8.8, hgb 10.8, Plts 264   Family Communication: Pt in room, family at bedside  Disposition: Status is: Inpatient Remains inpatient appropriate because: severity of illness  Planned Discharge Destination: Skilled nursing facility     Author: Rickey Barbara, MD 12/26/2022 4:07 PM  For on call review [email or web address].

## 2022-12-26 NOTE — Evaluation (Signed)
Physical Therapy Evaluation Patient Details Name: Raymond [name] MRN: [id number] DOB: 06-30-1965 Today's Date: 12/26/2022  History of Present Illness  57 yo male presents to therapy following hospital admission on 12/24/2022 due to progressive R LE pain and B LE weakness and edema impacting functional mobility. Pt sustained several recent falls and once of which resulted in R lateral malleolus fx 12/06/2022 pt presented to ED and d/c home with brace. Pt had follow up OP with orthopedic 7/14 and continued recommendation for conservative management and R LE WBAT with CAM boot donned for 6-8 weeks. Pt subsequently fell again on 7/21 resulting in increased LE pain and edema and presented to ED no acute fx and d/c home. Pt returned to ED on 7/24 indicating he has been bed bound since 7/21 due to inability to WB through R LE with worsening B knee pain R > L.and new onset of LE weakness. Pt R lateral malleolar fx is now slightly displaced distal fibular fx and orthopedics consulted with ongoing recommendation for conservative management. DVT negative. Pt also found to have anasarca with B LE edema and suspect for cellulitis with wound weeping. CT of head and spine negative for acute findings. Pt has PMH of DM II, tobacco abuse, COPD, gout, HLD, HTN, OSA on CPAP.  Clinical Impression     Pt admitted with above diagnosis.  Pt currently with functional limitations due to the deficits listed below (see PT Problem List). Pt had orthopedic consultation 7/25 and continues to recommend conservative management of R ankle fx with R LE WBAT with CAM boot donned. Pt in bed when PT arrived. Wife present. Pt indicated that his R ankle was not hurting him and reports B knee pain. Pt and wife reported significant fluid loss over the past 24 hrs and improving B LE edema. Pt required total assist to don R CAM boot and L standard shoe. Pt is S for supine <> sit. Pt required A x 2 for safety for sit to stand  from elevated EOB and exhibited  progression with standing tolerance and initiated gait tasks with side step to the L with RW and A x 2. Pt returned to bed, shoe and CAM boot doffed, visitors present and all needs in place. Wife indicated she is unable to assist pt in home setting and required assist from fire department for pt to enter/exit home prior to most recent hospitalization. Patient will benefit from continued inpatient follow up therapy, <3 hours/day. Pt will benefit from acute skilled PT to increase their independence and safety with mobility to allow discharge.         Assistance Recommended at Discharge Intermittent Supervision/Assistance  If plan is discharge home, recommend the following:  Can travel by private vehicle  Two people to help with walking and/or transfers;A lot of help with bathing/dressing/bathroom;Assistance with cooking/housework;Assist for transportation        Equipment Recommendations None recommended by PT (pt reports DME in home setting)  Recommendations for Other Services       Functional Status Assessment Patient has had a recent decline in their functional status and demonstrates the ability to make significant improvements in function in a reasonable and predictable amount of time.     Precautions / Restrictions Precautions Precautions: Fall Restrictions Weight Bearing Restrictions: Yes RLE Weight Bearing: Weight bearing as tolerated Other Position/Activity Restrictions: RLE WBAT with CAM boot donned      Mobility  Bed Mobility Overal bed mobility: Needs Assistance Bed Mobility: Supine to Sit, Sit  to Supine     Supine to sit: Supervision Sit to supine: Supervision   General bed mobility comments: HOB elvated and use of bed rails    Transfers Overall transfer level: Needs assistance Equipment used: Rolling walker (2 wheels) Transfers: Sit to/from Stand Sit to Stand: +2 physical assistance, +2 safety/equipment, From elevated surface, Mod assist            General transfer comment: PT donned R CAM boot and L standard shoe. first trail with sit to stand from EOB with pt electing to pull to stand PT unable to assist pt to upright standing, tech arrived and mod A x 1 and min A x 1 for sit to stand from elevated EOB and min A x 2 to maintain balance with cues for extension posture and encouragement, pt able to progress from 7s standing tolerance to 20 s standing tolerance and final sit to stand from EOB min A x 2.    Ambulation/Gait               General Gait Details: pt able to demonstrate ability to side step to L toward [facility] with heavy reliance on RW and min A x 2 ~ 2 feet with multimodal cues no apparent LE instability noted  Stairs            Wheelchair Mobility     Tilt Bed    Modified Rankin (Stroke Patients Only)       Balance Overall balance assessment: History of Falls, Needs assistance Sitting-balance support: Feet supported Sitting balance-Leahy Scale: Good     Standing balance support: Bilateral upper extremity supported, During functional activity, Reliant on assistive device for balance Standing balance-Leahy Scale: Poor                               Pertinent Vitals/Pain Pain Assessment Pain Assessment: 0-10 Pain Score: 6  Pain Location: B LE, knees no reports of R ankle pain Pain Descriptors / Indicators: Constant, Discomfort Pain Intervention(s): Limited activity within patient's tolerance, Monitored during session, Repositioned    Home Living Family/patient expects to be discharged to:: Private residence Living Arrangements: Spouse/significant other Available Help at Discharge: Family Type of Home: House Home Access: Ramped entrance       Home Layout: One level Home Equipment: Agricultural consultant (2 wheels);BSC/3in1;Wheelchair - manual      Prior Function Prior Level of Function : Driving;Working/employed;History of Falls (last six months);Independent/Modified Independent (pt reports  a minimum of 10 falls in the past 6 months)             Mobility Comments: 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 following R ankle fx pt has become progressivly weaker an has been unable to get OOB since 7/21. ADLs Comments: pt reports taking sponge baths     Hand Dominance        Extremity/Trunk Assessment        Lower Extremity Assessment Lower Extremity Assessment: Generalized weakness;RLE deficits/detail RLE Deficits / Details: ankle NT due to fx RLE Sensation: WNL (pt denies abn sensation B LE)    Cervical / Trunk Assessment Cervical / Trunk Assessment:  (head forward)  Communication   Communication: No difficulties  Cognition Arousal/Alertness: Awake/alert Behavior During Therapy: WFL for tasks assessed/performed Overall Cognitive Status: Within Functional Limits for tasks assessed  General Comments General comments (skin integrity, edema, etc.): B LE edema, pitting L dorsal surface of foot 15s    Exercises     Assessment/Plan    PT Assessment Patient needs continued PT services  PT Problem List Decreased strength;Decreased range of motion;Decreased activity tolerance;Decreased balance;Decreased mobility;Decreased coordination;Pain       PT Treatment Interventions DME instruction;Gait training;Functional mobility training;Therapeutic activities;Therapeutic exercise;Balance training;Neuromuscular re-education;Patient/family education    PT Goals (Current goals can be found in the Care Plan section)  Acute Rehab PT Goals 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 PT Goal Formulation: With patient Time For Goal Achievement: 01/16/23 Potential to Achieve Goals: Good    Frequency Min 1X/week     Co-evaluation               AM-PAC PT "6 Clicks" Mobility  Outcome Measure Help needed turning from your back to your side while in a flat bed without  using bedrails?: None Help needed moving from lying on your back to sitting on the side of a flat bed without using bedrails?: None Help needed moving to and from a bed to a chair (including a wheelchair)?: A Lot Help needed standing up from a chair using your arms (e.g., wheelchair or bedside chair)?: Total Help needed to walk in hospital room?: Total Help needed climbing 3-5 steps with a railing? : Total 6 Click Score: 13    End of Session Equipment Utilized During Treatment: Gait belt Activity Tolerance: Patient limited by fatigue Patient left: in bed;with call bell/phone within reach;with family/visitor present Nurse Communication: Mobility status;Need for lift equipment (total lift to get OOB at this time) PT Visit Diagnosis: Unsteadiness on feet (R26.81);Other abnormalities of gait and mobility (R26.89);Repeated falls (R29.6);Difficulty in walking, not elsewhere classified (R26.2);Pain Pain - Right/Left:  (B) Pain - part of body: Knee;Leg    Time: 6045-4098 PT Time Calculation (min) (ACUTE ONLY): 26 min   Charges:   PT Evaluation $PT Eval Low Complexity: 1 Low PT Treatments $Therapeutic Activity: 8-22 mins PT General Charges $$ ACUTE PT VISIT: 1 Visit         Johnny Bridge, PT Acute Rehab   Jacqualyn Posey 12/26/2022, 2:22 PM

## 2022-12-27 DIAGNOSIS — R627 Adult failure to thrive: Secondary | ICD-10-CM | POA: Diagnosis not present

## 2022-12-27 LAB — COMPREHENSIVE METABOLIC PANEL
ALT: 26 U/L (ref 0–44)
AST: 24 U/L (ref 15–41)
Albumin: 3.2 g/dL — ABNORMAL LOW (ref 3.5–5.0)
Alkaline Phosphatase: 84 U/L (ref 38–126)
Anion gap: 11 (ref 5–15)
BUN: 22 mg/dL — ABNORMAL HIGH (ref 6–20)
CO2: 27 mmol/L (ref 22–32)
Calcium: 9 mg/dL (ref 8.9–10.3)
Chloride: 100 mmol/L (ref 98–111)
Creatinine, Ser: 1.36 mg/dL — ABNORMAL HIGH (ref 0.61–1.24)
GFR, Estimated: 60 mL/min — ABNORMAL LOW (ref >60)
Glucose, Bld: 139 mg/dL — ABNORMAL HIGH (ref 70–99)
Potassium: 4 mmol/L (ref 3.5–5.1)
Sodium: 138 mmol/L (ref 135–145)
Total Bilirubin: 0.4 mg/dL (ref 0.3–1.2)
Total Protein: 6.7 g/dL (ref 6.5–8.1)

## 2022-12-27 LAB — GLUCOSE, CAPILLARY
Glucose-Capillary: 140 mg/dL — ABNORMAL HIGH (ref 70–99)
Glucose-Capillary: 207 mg/dL — ABNORMAL HIGH (ref 70–99)
Glucose-Capillary: 106 mg/dL — ABNORMAL HIGH (ref 70–99)
Glucose-Capillary: 139 mg/dL — ABNORMAL HIGH (ref 70–99)

## 2022-12-27 NOTE — Progress Notes (Signed)
Mobility Specialist - Progress Note   12/27/22 1500  Mobility  Activity Stood at bedside  Level of Assistance +2 (takes two people) (Min A)  Location manager Ambulated (ft) 3 ft  Range of Motion/Exercises Active  Mobility Referral Yes  $Mobility charge 1 Mobility  Mobility Specialist Start Time (ACUTE ONLY) 1450  Mobility Specialist Stop Time (ACUTE ONLY) 1505  Mobility Specialist Time Calculation (min) (ACUTE ONLY) 15 min   Pt received in bed and assisted with OT Sit to stands.   Min A using momentum to stand up, stood up twice for ~20 seconds each, second time took side steps towards head of bed where pt returned to bed with all needs met.  Marilynne Halsted Mobility Specialist

## 2022-12-27 NOTE — Progress Notes (Signed)
   12/26/22 2200  BiPAP/CPAP/SIPAP  BiPAP/CPAP/SIPAP Pt Type Adult  BiPAP/CPAP/SIPAP DREAMSTATIOND  Mask Type Full face mask  Mask Size Large  FiO2 (%) 21 %  Patient Home Equipment No  Auto Titrate Yes

## 2022-12-27 NOTE — Progress Notes (Signed)
Mobility Specialist - Progress Note   12/27/22 0945  Mobility  Activity Stood at bedside  Level of Assistance Maximum assist, patient does 25-49%  Assistive Device Front wheel walker  Distance Ambulated (ft) 0 ft  Range of Motion/Exercises Active  RLE Weight Bearing WBAT  Activity Response Tolerated well  Mobility Referral Yes  $Mobility charge 1 Mobility  Mobility Specialist Start Time (ACUTE ONLY) 0945  Mobility Specialist Stop Time (ACUTE ONLY) 0955  Mobility Specialist Time Calculation (min) (ACUTE ONLY) 10 min   Pt received in bed and agreed to mobility. Was already EOB, pt and I attempted two sit to stands, both unsuccessful. After 2 minute break attempted again.  Pt stood for ~25 seconds than sat back down, stood again for ~25 seconds and sat back down.   Pt was left EOB with all needs met. will return this afternoon if time permits.   Marilynne Halsted Mobility Specialist

## 2022-12-27 NOTE — Plan of Care (Signed)

## 2022-12-27 NOTE — Progress Notes (Signed)
   12/27/22 2206  BiPAP/CPAP/SIPAP  BiPAP/CPAP/SIPAP Pt Type Adult  BiPAP/CPAP/SIPAP DREAMSTATIOND  Mask Type Full face mask  Mask Size Large  FiO2 (%) 21 %  Patient Home Equipment No  Auto Titrate Yes

## 2022-12-27 NOTE — Progress Notes (Addendum)
Progress Note   Patient: Raymond [name] UEA:[id number] DOB: Aug 11, 1965 DOA: 12/24/2022     3 DOS: the patient was seen and examined on 12/27/2022   Brief hospital course: 57 y.o. male with medical history significant of obesity and other medical problems as listed below.  Patient was in his usual state of health till approximately December 06, 2022 when patient describes having chronic leg weakness with buckling of legs below him.  Patient actually presented to ER affiliated with [facility] health.  An x-ray of the right ankle revealed acute fracture of the lateral malleolus.  I am advised by the wife and patient that there is no skin injury overlying the fracture.  Patient was treated in the ER and discharged home with a brace.  Patient subsequently followed up with outpatient orthopedic surgeon on December 14, 2022.  I do not have access to office records from the orthopedic visit on December 14, 2022.  However the wife advises that the plan was for serial x-ray monitoring of the fracture, conservative management with a brace for about 6 to 8 weeks.   Unfortunately on December 21, 2022, 3 days ago, patient had another fall that is attributed to instability of gait due to right ankle fracture.  Patient has since then been reporting numbness on the right lateral thigh.  Patient does not report any new focal weakness since then nor any back pain or bladder or bowel changes.  However patient does report having progressive leg swelling bilateral since then.  Which is worse than his chronic leg swelling.  Patient was evaluated at ER of [facility] on December 21, 2022 subsequent to his fall.  Patient is also evaluated at the same time on December 21, 2022 for right knee pain.  Patient had x-ray testing done of areas of concern, no new acute fracture was found, patient was discharged home.   However wife describes that the patient has pretty much been bedbound since his discharge from [facility] ER on December 21, 2022.  Patient is not able to bear weight on the  right lower extremity.  Patient ascribes this to right knee pain.  Patient was seen at outpatient orthopedic clinic today, and given his poor functional status, transferred to [facility] inpatient at [facility] long.   Patient is now seen at [facility] long hospital in the inpatient unit on a telemetry bed.  As mentioned above patient has developed worsening lower extremity swelling over the last 2 to 3 weeks.  There is no chest pain or shortness of breath.  Patient has developed progressive worsening gait instability with pretty much bedbound status for the last 3 days.  Assessment and Plan: Displaced R Fibular fracture -recent fall with lateral malleolar fracture now with slighly displaced fibular fx -Cont with analgesia as tolerated -Have consulted Orthopedic Surgery. Recommendations for conservative management and PT noted -Pt did report fall with injury to head and back. Ordered and reviewed CT thoracic, lumbar spine and CT head. Unremarkable -PT/OT is following. Recs for SNF noted. TOC following  Tobacco abuse -cessation done this visit -Continue with nicotine patch as needed   Type 2 diabetes mellitus, without long-term current use of insulin (HCC) -cont hold oral meds while in hospital -cont with SSI as needed -glycemic trends stable   Anasarca with BLE edema -Patient's edema is reportedly worse over the last 3 weeks as per wife and patient.   -LE dopplers reviewed, neg for DVT -Monitor I/O and daily wts -2d echo reviewed, normal LVEF with normal RF  function -continues to have very good results with transition to 40mg  IV BID lasix -Peak wt 164.4kg -Wt today 138.8kg  Hypokalemia -normalized     Subjective: Feeling better today, feels less swollen  Physical Exam: Vitals:   12/26/22 2055 12/27/22 0449 12/27/22 0500 12/27/22 1324  BP: (!) 156/97 (!) 152/90  (!) 182/81  Pulse: (!) 110 (!) 103  (!) 108  Resp: 20 18  18   Temp: 97.8 F (36.6 C) 98.2 F (36.8 C)  98.4 F (36.9 C)   TempSrc: Oral Oral  Oral  SpO2: 95% 95%  95%  Weight:   (!) 138.8 kg   Height:       .trhs  Data Reviewed:  Labs reviewed: Na 138, K 4.0, Cr 1.36   Family Communication: Pt in room, family at bedside  Disposition: Status is: Inpatient Remains inpatient appropriate because: severity of illness  Planned Discharge Destination: Skilled nursing facility     Author: Rickey Barbara, MD 12/27/2022 4:50 PM  For on call review [email or web address].

## 2022-12-27 NOTE — Evaluation (Signed)
Occupational Therapy Evaluation Patient Details Name: Raymond [name] MRN: [id number] DOB: 04-Feb-1966 Today's Date: 12/27/2022   History of Present Illness 57 yo male presents to therapy following hospital admission on 12/24/2022 due to progressive R LE pain and B LE weakness and edema impacting functional mobility. Pt sustained several recent falls and once of which resulted in R lateral malleolus fx 12/06/2022 pt presented to ED and d/c home with brace. Pt had follow up OP with orthopedic 7/14 and continued recommendation for conservative management and R LE WBAT with CAM boot donned for 6-8 weeks. Pt subsequently fell again on 7/21 resulting in increased LE pain and edema and presented to ED no acute fx and d/c home. Pt returned to ED on 7/24 indicating he has been bed bound since 7/21 due to inability to WB through R LE with worsening B knee pain R > L.and new onset of LE weakness. Pt R lateral malleolar fx is now slightly displaced distal fibular fx and orthopedics consulted with ongoing recommendation for conservative management. DVT negative. Pt also found to have anasarca with B LE edema and suspect for cellulitis with wound weeping. CT of head and spine negative for acute findings. Pt has PMH of DM II, tobacco abuse, COPD, gout, HLD, HTN, OSA on CPAP.   Clinical Impression   Prior to early July, pt was functioning independently and working as a Naval architect. He has become increasingly weaker requiring extensive assistance for standing and ADLs by his wife and friends. Pt presents with generalized weakness, decreased activity tolerance and impaired standing balance. Pt is mod I in bed mobility with use of rail. He is able to stand with momentum from elevated surface with + min assist and take 2 side steps along EOB with RW. Decreased control of descent when sitting. Pt needs set up to total assist for ADLs. Patient will benefit from continued inpatient follow up therapy, <3 hours/day.     Recommendations  for follow up therapy are one component of a multi-disciplinary discharge planning process, led by the attending physician.  Recommendations Trainer be updated based on patient status, additional functional criteria and insurance authorization.   Assistance Recommended at Discharge Frequent or constant Supervision/Assistance  Patient can return home with the following Two people to help with walking and/or transfers;Two people to help with bathing/dressing/bathroom;Assist for transportation;Help with stairs or ramp for entrance    Functional Status Assessment  Patient has had a recent decline in their functional status and demonstrates the ability to make significant improvements in function in a reasonable and predictable amount of time.  Equipment Recommendations  Other (comment) (defer)    Recommendations for Other Services       Precautions / Restrictions Precautions Precautions: Fall Required Braces or Orthoses: Other Brace Other Brace: R CAM boot Restrictions Weight Bearing Restrictions: Yes RLE Weight Bearing: Weight bearing as tolerated      Mobility Bed Mobility Overal bed mobility: Modified Independent             General bed mobility comments: uses rail and momentum    Transfers Overall transfer level: Needs assistance Equipment used: Rolling walker (2 wheels) Transfers: Sit to/from Stand Sit to Stand: +2 physical assistance, Min assist, From elevated surface           General transfer comment: use of momentum, assist to rise and steady      Balance Overall balance assessment: History of Falls, Needs assistance Sitting-balance support: Feet supported Sitting balance-Leahy Scale: Good  Standing balance support: Bilateral upper extremity supported, During functional activity, Reliant on assistive device for balance Standing balance-Leahy Scale: Poor                             ADL either performed or assessed with clinical judgement    ADL Overall ADL's : Needs assistance/impaired Eating/Feeding: Independent;Sitting;Bed level   Grooming: Set up;Sitting   Upper Body Bathing: Minimal assistance;Sitting   Lower Body Bathing: Total assistance;Sit to/from stand;+2 for physical assistance   Upper Body Dressing : Set up;Sitting   Lower Body Dressing: +2 for physical assistance;Total assistance;Sit to/from stand;Bed level Lower Body Dressing Details (indicate cue type and reason): donned cam boot and shoe in supine     Toileting- Clothing Manipulation and Hygiene: Total assistance;+2 for physical assistance;Sit to/from stand       Functional mobility during ADLs: Minimal assistance;+2 for physical assistance;Rolling walker (2 wheels) (took 2 side step along EOB)       Vision Baseline Vision/History: 0 No visual deficits Ability to See in Adequate Light: 0 Adequate Patient Visual Report: No change from baseline       Perception     Praxis      Pertinent Vitals/Pain Pain Assessment Pain Assessment: Faces Faces Pain Scale: Hurts little more Pain Location: B LEs Pain Descriptors / Indicators: Cramping Pain Intervention(s): Monitored during session, Repositioned     Hand Dominance Right   Extremity/Trunk Assessment Upper Extremity Assessment Upper Extremity Assessment: Overall WFL for tasks assessed   Lower Extremity Assessment Lower Extremity Assessment: Defer to PT evaluation   Cervical / Trunk Assessment Cervical / Trunk Assessment: Other exceptions (increased body habitus)   Communication Communication Communication: No difficulties   Cognition Arousal/Alertness: Awake/alert Behavior During Therapy: WFL for tasks assessed/performed Overall Cognitive Status: Within Functional Limits for tasks assessed                                       General Comments       Exercises     Shoulder Instructions      Home Living Family/patient expects to be discharged to:: Private  residence Living Arrangements: Spouse/significant other Available Help at Discharge: Family;Available 24 hours/day Type of Home: House Home Access: Ramped entrance     Home Layout: One level     Bathroom Shower/Tub: Chief Strategy Officer: Standard     Home Equipment: Agricultural consultant (2 wheels);BSC/3in1;Wheelchair - Careers adviser (comment) (lift chair)          Prior Functioning/Environment Prior Level of Function : Driving;Working/employed;History of Falls (last six months);Independent/Modified Independent (truck driver)             Mobility Comments: 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 following R ankle fx pt has become progressively weaker an has been unable to get OOB since 7/21. ADLs Comments: pt reports taking sponge baths, using BSC, assist for pericare of wife        OT Problem List: Decreased strength;Impaired balance (sitting and/or standing);Decreased knowledge of use of DME or AE;Obesity      OT Treatment/Interventions: Self-care/ADL training;DME and/or AE instruction;Therapeutic activities;Patient/family education;Balance training    OT Goals(Current goals can be found in the care plan section) Acute Rehab OT Goals OT Goal Formulation: With patient/family Time For Goal Achievement: 01/10/23 Potential to Achieve Goals: Good ADL Goals Pt  Will Perform Grooming: with min assist;standing (one activity) Pt Will Perform Lower Body Bathing: with mod assist;sit to/from stand;with adaptive equipment Pt Will Perform Lower Body Dressing: with mod assist;sit to/from stand;sitting/lateral leans;with adaptive equipment Pt Will Transfer to Toilet: with min assist;with +2 assist;ambulating;bedside commode Pt Will Perform Toileting - Clothing Manipulation and hygiene: with mod assist;sit to/from stand  OT Frequency: Min 1X/week    Co-evaluation              AM-PAC OT "6 Clicks" Daily Activity     Outcome Measure Help from  another person eating meals?: None Help from another person taking care of personal grooming?: A Little Help from another person toileting, which includes using toliet, bedpan, or urinal?: Total Help from another person bathing (including washing, rinsing, drying)?: A Lot Help from another person to put on and taking off regular upper body clothing?: A Little Help from another person to put on and taking off regular lower body clothing?: Total 6 Click Score: 14   End of Session Equipment Utilized During Treatment: Rolling walker (2 wheels);Gait belt  Activity Tolerance: Patient tolerated treatment well Patient left: in bed;with call bell/phone within reach;with family/visitor present  OT Visit Diagnosis: Unsteadiness on feet (R26.81);Other abnormalities of gait and mobility (R26.89);Muscle weakness (generalized) (M62.81)                Time: 3244-0102 OT Time Calculation (min): 26 min Charges:  OT General Charges $OT Visit: 1 Visit OT Evaluation $OT Eval Moderate Complexity: 1 Mod OT Treatments $Therapeutic Activity: 8-22 mins  Berna Spare, OTR/L Acute Rehabilitation Services Office: [phone]  Shlok, Eno 12/27/2022, 3:28 PM

## 2022-12-28 DIAGNOSIS — R627 Adult failure to thrive: Secondary | ICD-10-CM | POA: Diagnosis not present

## 2022-12-28 LAB — GLUCOSE, CAPILLARY
Glucose-Capillary: 127 mg/dL — ABNORMAL HIGH (ref 70–99)
Glucose-Capillary: 182 mg/dL — ABNORMAL HIGH (ref 70–99)
Glucose-Capillary: 153 mg/dL — ABNORMAL HIGH (ref 70–99)
Glucose-Capillary: 122 mg/dL — ABNORMAL HIGH (ref 70–99)

## 2022-12-28 LAB — COMPREHENSIVE METABOLIC PANEL WITH GFR
ALT: 27 U/L (ref 0–44)
AST: 25 U/L (ref 15–41)
Albumin: 3 g/dL — ABNORMAL LOW (ref 3.5–5.0)
Alkaline Phosphatase: 73 U/L (ref 38–126)
Anion gap: 9 (ref 5–15)
BUN: 19 mg/dL (ref 6–20)
CO2: 30 mmol/L (ref 22–32)
Calcium: 8.9 mg/dL (ref 8.9–10.3)
Chloride: 97 mmol/L — ABNORMAL LOW (ref 98–111)
Creatinine, Ser: 1.33 mg/dL — ABNORMAL HIGH (ref 0.61–1.24)
GFR, Estimated: >60 mL/min (ref >60)
Glucose, Bld: 120 mg/dL — ABNORMAL HIGH (ref 70–99)
Potassium: 3 mmol/L — ABNORMAL LOW (ref 3.5–5.1)
Sodium: 136 mmol/L (ref 135–145)
Total Bilirubin: 0.6 mg/dL (ref 0.3–1.2)
Total Protein: 6.8 g/dL (ref 6.5–8.1)

## 2022-12-28 MED ORDER — POTASSIUM CHLORIDE CRYS ER 20 MEQ PO TBCR
60.0000 meq | EXTENDED_RELEASE_TABLET | ORAL | Status: AC
  Administered 2022-12-28 (×2): 60 meq via ORAL
  Filled 2022-12-28 (×2): qty 3

## 2022-12-28 MED ORDER — FUROSEMIDE 10 MG/ML IJ SOLN
60.0000 mg | Freq: Two times a day (BID) | INTRAMUSCULAR | Status: DC
  Administered 2022-12-29 – 2022-12-30 (×3): 60 mg via INTRAVENOUS
  Filled 2022-12-28 (×3): qty 6

## 2022-12-28 MED ORDER — METHOCARBAMOL 500 MG PO TABS
500.0000 mg | ORAL_TABLET | Freq: Four times a day (QID) | ORAL | Status: DC | PRN
  Administered 2022-12-28 – 2022-12-31 (×5): 500 mg via ORAL
  Filled 2022-12-28 (×6): qty 1

## 2022-12-28 NOTE — Progress Notes (Signed)
Mobility Specialist - Progress Note   12/28/22 1023  Mobility  Activity Stood at bedside  Level of Assistance +2 (takes two people)  Nurse, learning disability of Motion/Exercises Active Assistive  Activity Response Tolerated well  Mobility Referral Yes  $Mobility charge 1 Mobility  Mobility Specialist Start Time (ACUTE ONLY) 1005  Mobility Specialist Stop Time (ACUTE ONLY) 1023  Mobility Specialist Time Calculation (min) (ACUTE ONLY) 18 min   Pt was found in bed and agreeable to practice standing. NT assisted with session. Placed R boot on pt for session. Pt had x1 failed STS and x3 successful with times listed below.  After x1 failed STS cued pt on feet and body placement. At EOS was left in bed with wife and call bell in reach.  35 seconds 15 seconds 17 seconds  Billey Chang Mobility Specialist

## 2022-12-28 NOTE — Progress Notes (Signed)
Orthopaedic Trauma Progress Note  SUBJECTIVE: Doing okay today.  Feeling somewhat better over the last few days.  Patient with swelling throughout bilateral lower extremities, ultrasound was negative for DVT.  Patient was able to stand at bedside with mobility specialist 2 times yesterday for about 25 seconds each.  This is a big improvement in terms of mobility from when he was initially admitted.  Biggest concern currently is the continued swelling throughout bilateral legs.  Also notes some muscle spasms throughout the right knee and lower leg.  Will add a muscle relaxer today.  Right knee is bothering him more than the right ankle at this point.  OBJECTIVE:  Vitals:   12/27/22 2012 12/28/22 0518  BP: (!) 180/81 (!) 151/79  Pulse: (!) 104 (!) 101  Resp: 16 15  Temp: 99 F (37.2 C) 98.1 F (36.7 C)  SpO2: 91% (!) 87%    General: Sitting up on edge of bed, no acute distress.  Eating breakfast Respiratory: No increased work of breathing.  Right lower extremity: Swelling throughout extremity, equal to contralateral limb.  SCD in place.  Tenderness over the medial and lateral joint line of the knee.  No significant tenderness with palpation over the distal fibula or medial malleolus.  Notes discomfort with knee motion due to baseline arthritis.  Tolerates gentle ankle range of motion.  Able to wiggle toes.  Decreased sensation through the foot and toes, this is baseline.  Foot warm and well-perfused.  IMAGING: AP and lateral views of the left tibia/fibula show slightly displaced fracture of the right distal fibula.  Overall imaging stable.  LABS:  Results for orders placed or performed during the hospital encounter of 12/24/22 (from the past 24 hour(s))  Glucose, capillary     Status: Abnormal   Collection Time: 12/27/22 11:15 AM  Result Value Ref Range   Glucose-Capillary 207 (H) 70 - 99 mg/dL  Glucose, capillary     Status: Abnormal   Collection Time: 12/27/22  4:25 PM  Result Value Ref  Range   Glucose-Capillary 106 (H) 70 - 99 mg/dL  Glucose, capillary     Status: Abnormal   Collection Time: 12/27/22  8:13 PM  Result Value Ref Range   Glucose-Capillary 139 (H) 70 - 99 mg/dL  Glucose, capillary     Status: Abnormal   Collection Time: 12/28/22  8:00 AM  Result Value Ref Range   Glucose-Capillary 127 (H) 70 - 99 mg/dL    ASSESSMENT: Raymond [name] is a 57 y.o. male with right distal fibula fracture, admitted for failure to thrive  CV/Blood loss: Hemoglobin 10.8 on 12/26/2022.  PLAN: Weightbearing: WBAT RLE in CAM boot ROM: Unrestricted motion as tolerated Showering: Okay to shower Orthopedic device(s): CAM boot RLE when OOB Pain management: Continue current pain control.  Will add muscle relaxer today as needed VTE prophylaxis: Lovenox, SCDs Foley/Lines:  No foley, KVO IVFs Impediments to Fracture Healing: DM. Vit D level pending, will start supplementation as indicated. Dispo: Therapy evaluations ongoing.  Patient will require SNF.  Continue working on swelling bilateral lower extremities.  Okay for discharge from ortho standpoint once cleared by medicine team and therapies  Follow - up plan: 2 weeks with Dr. Eulah Pont for repeat x-rays   Contact information:  Truitt Merle MD, Thyra Breed PA-C. After hours and holidays please check [email or web address] for group call information for [facility]   Thompson Caul, PA-C [phone] (office) [email or web address]

## 2022-12-28 NOTE — Plan of Care (Signed)

## 2022-12-28 NOTE — Progress Notes (Signed)
   12/28/22 2300  BiPAP/CPAP/SIPAP  $ Non-Invasive Home Ventilator  Subsequent  BiPAP/CPAP/SIPAP Pt Type Adult  BiPAP/CPAP/SIPAP DREAMSTATIOND  Mask Type Full face mask  Mask Size Large  FiO2 (%) 21 %  Patient Home Equipment No  Auto Titrate Yes

## 2022-12-28 NOTE — Progress Notes (Signed)
Progress Note   Patient: Raymond [name] ZOX:[id number] DOB: 31-May-1966 DOA: 12/24/2022     4 DOS: the patient was seen and examined on 12/28/2022   Brief hospital course: 57 y.o. male with medical history significant of obesity and other medical problems as listed below.  Patient was in his usual state of health till approximately December 06, 2022 when patient describes having chronic leg weakness with buckling of legs below him.  Patient actually presented to ER affiliated with [facility] health.  An x-ray of the right ankle revealed acute fracture of the lateral malleolus.  I am advised by the wife and patient that there is no skin injury overlying the fracture.  Patient was treated in the ER and discharged home with a brace.  Patient subsequently followed up with outpatient orthopedic surgeon on December 14, 2022.  I do not have access to office records from the orthopedic visit on December 14, 2022.  However the wife advises that the plan was for serial x-ray monitoring of the fracture, conservative management with a brace for about 6 to 8 weeks.   Unfortunately on December 21, 2022, 3 days ago, patient had another fall that is attributed to instability of gait due to right ankle fracture.  Patient has since then been reporting numbness on the right lateral thigh.  Patient does not report any new focal weakness since then nor any back pain or bladder or bowel changes.  However patient does report having progressive leg swelling bilateral since then.  Which is worse than his chronic leg swelling.  Patient was evaluated at ER of [facility] on December 21, 2022 subsequent to his fall.  Patient is also evaluated at the same time on December 21, 2022 for right knee pain.  Patient had x-ray testing done of areas of concern, no new acute fracture was found, patient was discharged home.   However wife describes that the patient has pretty much been bedbound since his discharge from [facility] ER on December 21, 2022.  Patient is not able to bear weight on the  right lower extremity.  Patient ascribes this to right knee pain.  Patient was seen at outpatient orthopedic clinic today, and given his poor functional status, transferred to [facility] inpatient at [facility] long.   Patient is now seen at [facility] long hospital in the inpatient unit on a telemetry bed.  As mentioned above patient has developed worsening lower extremity swelling over the last 2 to 3 weeks.  There is no chest pain or shortness of breath.  Patient has developed progressive worsening gait instability with pretty much bedbound status for the last 3 days.  Assessment and Plan: Displaced R Fibular fracture -recent fall with lateral malleolar fracture now with slighly displaced fibular fx -Cont with analgesia as tolerated -Have consulted Orthopedic Surgery. Recommendations for conservative management and PT noted -Pt did report fall with injury to head and back. Ordered and reviewed CT thoracic, lumbar spine and CT head. Unremarkable -PT/OT is following. Recs for SNF noted. TOC was consulted  Tobacco abuse -cessation done this visit -Continue with nicotine patch as needed   Type 2 diabetes mellitus, without long-term current use of insulin (HCC) -cont hold oral meds while in hospital -cont with SSI as needed -glycemic trends stable   Anasarca with BLE edema -Patient's edema is reportedly worse over the last 3 weeks as per wife and patient.   -LE dopplers reviewed, neg for DVT -Monitor I/O and daily wts -recommend elevate legs at rest -2d echo  reviewed, normal LVEF with normal RF function -continues to have very good results with IV lasix, increase dose to 60mg  today -Peak wt 164.4kg -Wt today 139.4kg  Hypokalemia -will replace     Subjective: States feeling better  Physical Exam: Vitals:   12/28/22 0518 12/28/22 0700 12/28/22 1322 12/28/22 1511  BP: (!) 151/79  (!) 165/77 (!) 156/89  Pulse: (!) 101  97 73  Resp: 15  18   Temp: 98.1 F (36.7 C)  98 F (36.7 C)   TempSrc:  Oral  Oral   SpO2: (!) 87%  95%   Weight:  (!) 139.4 kg    Height:       General exam: Conversant, in no acute distress Respiratory system: normal chest rise, clear, no audible wheezing Cardiovascular system: regular rhythm, s1-s2 Gastrointestinal system: Nondistended, nontender, pos BS Central nervous system: No seizures, no tremors Extremities: No cyanosis, no joint deformities, BLE edema Skin: No rashes, no pallor Psychiatry: Affect normal // no auditory hallucinations   Data Reviewed:  Labs reviewed: Na 136, K 3.0, Cr 1.33   Family Communication: Pt in room, family at bedside  Disposition: Status is: Inpatient Remains inpatient appropriate because: severity of illness  Planned Discharge Destination: Skilled nursing facility     Author: Rickey Barbara, MD 12/28/2022 5:33 PM  For on call review [email or web address].

## 2022-12-29 DIAGNOSIS — R627 Adult failure to thrive: Secondary | ICD-10-CM | POA: Diagnosis not present

## 2022-12-29 LAB — GLUCOSE, CAPILLARY
Glucose-Capillary: 152 mg/dL — ABNORMAL HIGH (ref 70–99)
Glucose-Capillary: 139 mg/dL — ABNORMAL HIGH (ref 70–99)
Glucose-Capillary: 136 mg/dL — ABNORMAL HIGH (ref 70–99)
Glucose-Capillary: 161 mg/dL — ABNORMAL HIGH (ref 70–99)

## 2022-12-29 MED ORDER — VITAMIN D 25 MCG (1000 UNIT) PO TABS
1000.0000 [IU] | ORAL_TABLET | Freq: Every day | ORAL | Status: DC
  Administered 2022-12-29 – 2023-01-12 (×15): 1000 [IU] via ORAL
  Filled 2022-12-29 (×15): qty 1

## 2022-12-29 NOTE — Progress Notes (Signed)
Physical Therapy Treatment Patient Details Name: Raymond [name] MRN: [id number] DOB: [birth year] Today's Date: 12/29/2022   History of Present Illness 57 yo male presents to therapy following hospital admission on 12/24/2022 due to progressive R LE pain and B LE weakness and edema impacting functional mobility. Pt sustained several recent falls and once of which resulted in R lateral malleolus fx 12/06/2022 pt presented to ED and d/c home with brace. Pt had follow up OP with orthopedic 7/14 and continued recommendation for conservative management and R LE WBAT with CAM boot donned for 6-8 weeks. Pt subsequently fell again on 7/21 resulting in increased LE pain and edema and presented to ED no acute fx and d/c home. Pt returned to ED on 7/24 indicating he has been bed bound since 7/21 due to inability to WB through R LE with worsening B knee pain R > L.and new onset of LE weakness. Pt R lateral malleolar fx is now slightly displaced distal fibular fx and orthopedics consulted with ongoing recommendation for conservative management. DVT negative. Pt also found to have anasarca with B LE edema and suspect for cellulitis with wound weeping. CT of head and spine negative for acute findings. Pt has PMH of DM II, tobacco abuse, COPD, gout, HLD, HTN, OSA on CPAP.    PT Comments   Pt admitted with above diagnosis.  Pt currently with functional limitations due to the deficits listed below (see PT Problem List). PT reviewed orthopedic noted from 7/28 indicating pt is able to perform gentle R ankle ROM to pt tolerance. Pt seated EOB when PT arrived, pt did not have R CAM boot or L standard shoe donned. PT assisted pt. Noted decreased B LE edema vs at time of eval R < L. Pt reported R knee pain > R ankle pain. PT attempted to assist pt to standing from EOB and unable with A x 1. Rehab tech arrived and mod A x 2 for sit to stand  from elevated EOB, pt requires min A for static standing balance and encouragement for increasing  standing tolerance 35s, 40s and 60s. Pt exhibits poor eccentric control to EOB reporting that his knees just give out on him. Pt demonstrated ability to side step to L toward [facility] with min A x 1 and min guard x 1 with improved management of RW. Pt has not been able to progress at this time toward SPT or anterior ambulation tasks and is unsafe to return home with wife at this time. Patient will benefit from continued inpatient follow up therapy, <3 hours/day. Pt will benefit from acute skilled PT to increase their independence and safety with mobility to allow discharge.      If plan is discharge home, recommend the following: Two people to help with walking and/or transfers;A lot of help with bathing/dressing/bathroom;Assistance with cooking/housework;Assist for transportation   Can travel by private vehicle        Equipment Recommendations  None recommended by PT (pt reports DME in home setting)    Recommendations for Other Services       Precautions / Restrictions Precautions Precautions: Fall Required Braces or Orthoses: Other Brace Other Brace: R CAM boot Restrictions Weight Bearing Restrictions: Yes RLE Weight Bearing: Weight bearing as tolerated Other Position/Activity Restrictions: RLE WBAT with CAM boot donned     Mobility  Bed Mobility Overal bed mobility: Modified Independent             General bed mobility comments: pt seated EOB when PT arrived  Transfers Overall transfer level: Needs assistance Equipment used: Rolling walker (2 wheels) Transfers: Sit to/from Stand Sit to Stand: +2 physical assistance, From elevated surface, Mod assist           General transfer comment: use of momentum, assist to rise and steady with cues for extension posture in standing and pt able to progress to min A to maintain standing balance with B UE support at RW, pt exhibits poor eccentric control to return to EOB    Ambulation/Gait               General Gait  Details: pt able to demonstrate ability to side step to L toward [facility] with heavy reliance on RW and min A x 1 and min gaurd x 1 ~ 2 feet with multimodal cues no apparent LE instability noted and not reports of increased pain   Stairs             Wheelchair Mobility     Tilt Bed    Modified Rankin (Stroke Patients Only)       Balance Overall balance assessment: History of Falls, Needs assistance Sitting-balance support: Feet supported Sitting balance-Leahy Scale: Good     Standing balance support: Bilateral upper extremity supported, During functional activity, Reliant on assistive device for balance Standing balance-Leahy Scale: Poor                              Cognition Arousal/Alertness: Awake/alert Behavior During Therapy: WFL for tasks assessed/performed Overall Cognitive Status: Within Functional Limits for tasks assessed                                          Exercises      General Comments General comments (skin integrity, edema, etc.): B LE edema L >R at time of intervention      Pertinent Vitals/Pain Pain Assessment Pain Assessment: Faces Faces Pain Scale: Hurts a little bit Breathing: normal Pain Location: B LEs, R knee > R ankle Pain Descriptors / Indicators: Cramping, Constant, Discomfort Pain Intervention(s): Limited activity within patient's tolerance, Monitored during session, Repositioned    Home Living Family/patient expects to be discharged to:: Private residence Living Arrangements: Spouse/significant other Available Help at Discharge: Family;Available 24 hours/day Type of Home: House Home Access: Ramped entrance       Home Layout: One level Home Equipment: Agricultural consultant (2 wheels);BSC/3in1;Wheelchair - Careers adviser (comment) (lift chair)      Prior Function            PT Goals (current goals can now be found in the care plan section) Acute Rehab PT Goals 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 PT Goal Formulation: With patient Time For Goal Achievement: 01/16/23 Potential to Achieve Goals: Good Progress towards PT goals: Progressing toward goals    Frequency    Min 1X/week      PT Plan Current plan remains appropriate    Co-evaluation              AM-PAC PT "6 Clicks" Mobility   Outcome Measure  Help needed turning from your back to your side while in a flat bed without using bedrails?: None Help needed moving from lying on your back to sitting on the side of a flat bed without using bedrails?: None Help needed moving to and  from a bed to a chair (including a wheelchair)?: A Lot Help needed standing up from a chair using your arms (e.g., wheelchair or bedside chair)?: Total Help needed to walk in hospital room?: Total Help needed climbing 3-5 steps with a railing? : Total 6 Click Score: 13    End of Session Equipment Utilized During Treatment: Gait belt Activity Tolerance: Patient limited by fatigue;No increased pain Patient left: in bed;with call bell/phone within reach;with family/visitor present (seated EOB for lunch) Nurse Communication: Mobility status;Need for lift equipment (total lift to get OOB at this time) PT Visit Diagnosis: Unsteadiness on feet (R26.81);Other abnormalities of gait and mobility (R26.89);Repeated falls (R29.6);Difficulty in walking, not elsewhere classified (R26.2);Pain Pain - Right/Left:  (B) Pain - part of body: Knee;Leg     Time: 1610-9604 PT Time Calculation (min) (ACUTE ONLY): 29 min  Charges:    $Therapeutic Activity: 23-37 mins PT General Charges $$ ACUTE PT VISIT: 1 Visit                     Johnny Bridge, PT Acute Rehab    Jacqualyn Posey 12/29/2022, 2:55 PM

## 2022-12-29 NOTE — Progress Notes (Signed)
Progress Note   Patient: Raymond [name] QVZ:[id number] DOB: 1965/06/10 DOA: 12/24/2022     5 DOS: the patient was seen and examined on 12/29/2022   Brief hospital course: 57 y.o. male with medical history significant of obesity and other medical problems as listed below.  Patient was in his usual state of health till approximately December 06, 2022 when patient describes having chronic leg weakness with buckling of legs below him.  Patient actually presented to ER affiliated with [facility] health.  An x-ray of the right ankle revealed acute fracture of the lateral malleolus.  I am advised by the wife and patient that there is no skin injury overlying the fracture.  Patient was treated in the ER and discharged home with a brace.  Patient subsequently followed up with outpatient orthopedic surgeon on December 14, 2022.  I do not have access to office records from the orthopedic visit on December 14, 2022.  However the wife advises that the plan was for serial x-ray monitoring of the fracture, conservative management with a brace for about 6 to 8 weeks.   Unfortunately on December 21, 2022, 3 days ago, patient had another fall that is attributed to instability of gait due to right ankle fracture.  Patient has since then been reporting numbness on the right lateral thigh.  Patient does not report any new focal weakness since then nor any back pain or bladder or bowel changes.  However patient does report having progressive leg swelling bilateral since then.  Which is worse than his chronic leg swelling.  Patient was evaluated at ER of [facility] on December 21, 2022 subsequent to his fall.  Patient is also evaluated at the same time on December 21, 2022 for right knee pain.  Patient had x-ray testing done of areas of concern, no new acute fracture was found, patient was discharged home.   However wife describes that the patient has pretty much been bedbound since his discharge from [facility] ER on December 21, 2022.  Patient is not able to bear weight on the  right lower extremity.  Patient ascribes this to right knee pain.  Patient was seen at outpatient orthopedic clinic today, and given his poor functional status, transferred to [facility] inpatient at [facility] long.   Patient is now seen at [facility] long hospital in the inpatient unit on a telemetry bed.  As mentioned above patient has developed worsening lower extremity swelling over the last 2 to 3 weeks.  There is no chest pain or shortness of breath.  Patient has developed progressive worsening gait instability with pretty much bedbound status for the last 3 days.  Assessment and Plan: Displaced R Fibular fracture -recent fall with lateral malleolar fracture now with slighly displaced fibular fx -Cont with analgesia as tolerated -Have consulted Orthopedic Surgery. Recommendations for conservative management and PT noted -Pt did report fall with injury to head and back. Ordered and reviewed CT thoracic, lumbar spine and CT head. Unremarkable -PT/OT is following. Recs for SNF noted. TOC following  Tobacco abuse -cessation done this visit -Continue with nicotine patch as needed   Type 2 diabetes mellitus, without long-term current use of insulin (HCC) -cont hold oral meds while in hospital -cont with SSI as needed -glycemic trends stable   Anasarca with BLE edema -Patient's edema is reportedly worse over the last 3 weeks as per wife and patient.   -LE dopplers reviewed, neg for DVT -Monitor I/O and daily wts -recommend elevate legs at rest -2d echo reviewed,  normal LVEF with normal RF function -continues to have very good results with IV lasix, continue lasix -recheck bmet in AM -Peak wt 164.4kg  Hypokalemia -normalized  Low vitamin D -vit d of 13.9 -will order supplement     Subjective Reports feeling better  Physical Exam: Vitals:   12/28/22 1511 12/28/22 2142 12/29/22 0538 12/29/22 1414  BP: (!) 156/89 (!) 158/80 (!) 154/67 (!) 170/82  Pulse: 73 92 99 100  Resp:  14 14 16    Temp:  98.3 F (36.8 C) 97.7 F (36.5 C) 97.9 F (36.6 C)  TempSrc:  Oral Oral Oral  SpO2:  94% (!) 89% 97%  Weight:      Height:       General exam: Awake, laying in bed, in nad Respiratory system: Normal respiratory effort, no wheezing Cardiovascular system: regular rate, s1, s2 Gastrointestinal system: Soft, nondistended, positive BS Central nervous system: CN2-12 grossly intact, strength intact Extremities: Perfused, no clubbing Skin: Normal skin turgor, no notable skin lesions seen Psychiatry: Mood normal // no visual hallucinations   Data Reviewed:  Labs reviewed: Na 135, K 3.6, Cr 1.30   Family Communication: Pt in room, family at bedside  Disposition: Status is: Inpatient Remains inpatient appropriate because: severity of illness  Planned Discharge Destination: Skilled nursing facility     Author: Rickey Barbara, MD 12/29/2022 5:56 PM  For on call review [email or web address].

## 2022-12-29 NOTE — Progress Notes (Signed)
   12/29/22 2008  BiPAP/CPAP/SIPAP  BiPAP/CPAP/SIPAP Pt Type Adult  BiPAP/CPAP/SIPAP DREAMSTATIOND  Mask Type Full face mask  Mask Size Large  FiO2 (%) 21 %  Patient Home Equipment No  Auto Titrate Yes    Pt stated he can place self on/off cpap

## 2022-12-30 DIAGNOSIS — R627 Adult failure to thrive: Secondary | ICD-10-CM | POA: Diagnosis not present

## 2022-12-30 LAB — GLUCOSE, CAPILLARY
Glucose-Capillary: 146 mg/dL — ABNORMAL HIGH (ref 70–99)
Glucose-Capillary: 149 mg/dL — ABNORMAL HIGH (ref 70–99)
Glucose-Capillary: 144 mg/dL — ABNORMAL HIGH (ref 70–99)
Glucose-Capillary: 130 mg/dL — ABNORMAL HIGH (ref 70–99)

## 2022-12-30 MED ORDER — FUROSEMIDE 10 MG/ML IJ SOLN
80.0000 mg | Freq: Two times a day (BID) | INTRAMUSCULAR | Status: DC
  Administered 2022-12-30 – 2023-01-06 (×14): 80 mg via INTRAVENOUS
  Filled 2022-12-30 (×14): qty 8

## 2022-12-30 MED ORDER — POLYETHYLENE GLYCOL 3350 17 G PO PACK
17.0000 g | PACK | Freq: Two times a day (BID) | ORAL | Status: DC
  Administered 2022-12-30 – 2023-01-12 (×23): 17 g via ORAL
  Filled 2022-12-30 (×25): qty 1

## 2022-12-30 MED ORDER — POTASSIUM CHLORIDE CRYS ER 20 MEQ PO TBCR
60.0000 meq | EXTENDED_RELEASE_TABLET | ORAL | Status: DC
Start: 2022-12-30 — End: 2022-12-30

## 2022-12-30 MED ORDER — DOCUSATE SODIUM 100 MG PO CAPS
100.0000 mg | ORAL_CAPSULE | Freq: Two times a day (BID) | ORAL | Status: DC
  Administered 2022-12-30 – 2023-01-12 (×25): 100 mg via ORAL
  Filled 2022-12-30 (×25): qty 1

## 2022-12-30 MED ORDER — POTASSIUM CHLORIDE CRYS ER 20 MEQ PO TBCR
60.0000 meq | EXTENDED_RELEASE_TABLET | Freq: Once | ORAL | Status: AC
  Administered 2022-12-30: 60 meq via ORAL
  Filled 2022-12-30: qty 3

## 2022-12-30 NOTE — Plan of Care (Signed)

## 2022-12-30 NOTE — Progress Notes (Signed)
Occupational Therapy Treatment Patient Details Name: Raymond [name] MRN: [id number] DOB: 05/17/1966 Today's Date: 12/30/2022   History of present illness 57 yo male presents to therapy following hospital admission on 12/24/2022 due to progressive R LE pain and B LE weakness and edema impacting functional mobility. Pt sustained several recent falls and once of which resulted in R lateral malleolus fx 12/06/2022 pt presented to ED and d/c home with brace. Pt had follow up OP with orthopedic 7/14 and continued recommendation for conservative management and R LE WBAT with CAM boot donned for 6-8 weeks. Pt subsequently fell again on 7/21 resulting in increased LE pain and edema and presented to ED no acute fx and d/c home. Pt returned to ED on 7/24 indicating he has been bed bound since 7/21 due to inability to WB through R LE with worsening B knee pain R > L.and new onset of LE weakness. Pt R lateral malleolar fx is now slightly displaced distal fibular fx and orthopedics consulted with ongoing recommendation for conservative management. DVT negative. Pt also found to have anasarca with B LE edema and suspect for cellulitis with wound weeping. CT of head and spine negative for acute findings. Pt has PMH of DM II, tobacco abuse, COPD, gout, HLD, HTN, OSA on CPAP.   OT comments  Patient was noted to have pain of 8/10 in R ankle with patient declining to take pain medications. Patient and wife were educated on importance of pain management in the therapeutic process. Patient verbalized understanding. Patient unable to stand longer than 30 seconds with +2 A with multiple attempts made during session. Patient was able to engage in grooming and UB hygiene tasks sitting EOB with increased time and encouragement. Patient's discharge plan remains appropriate at this time. OT will continue to follow acutely.     Recommendations for follow up therapy are one component of a multi-disciplinary discharge planning process, led by  the attending physician.  Recommendations [place] be updated based on patient status, additional functional criteria and insurance authorization.    Assistance Recommended at Discharge Frequent or constant Supervision/Assistance  Patient can return home with the following  Two people to help with walking and/or transfers;Two people to help with bathing/dressing/bathroom;Assist for transportation;Help with stairs or ramp for entrance   Equipment Recommendations  None recommended by OT       Precautions / Restrictions Precautions Precautions: Fall Required Braces or Orthoses: Other Brace Other Brace: R CAM boot Restrictions Weight Bearing Restrictions: Yes RLE Weight Bearing: Weight bearing as tolerated Other Position/Activity Restrictions: RLE WBAT with CAM boot donned, R ankle ROM as tolerated       Mobility Bed Mobility               General bed mobility comments: patient was sitting EOB upon arrival and left in same position at end of session            Balance Overall balance assessment: History of Falls, Needs assistance Sitting-balance support: Feet supported Sitting balance-Leahy Scale: Good     Standing balance support: Bilateral upper extremity supported, During functional activity, Reliant on assistive device for balance Standing balance-Leahy Scale: Zero Standing balance comment: pt exhibited minimal abilty to assist to maintain static standing today                           ADL either performed or assessed with clinical judgement   ADL Overall ADL's : Needs assistance/impaired  Grooming: Set up;Sitting;Wash/dry face;Wash/dry hands;Applying deodorant;Brushing hair Grooming Details (indicate cue type and reason): EOB completing shampoo cap Upper Body Bathing: Minimal assistance;Sitting Upper Body Bathing Details (indicate cue type and reason): EOB     Upper Body Dressing : Set up;Sitting       Toilet Transfer: +2 for physical  assistance;+2 for safety/equipment Toilet Transfer Details (indicate cue type and reason): attempts to stand to complete weight on scale v.s. bed scale. patient unable to stand past 30 seconds to complete task. Toileting- Clothing Manipulation and Hygiene: Total assistance;+2 for physical assistance;Sit to/from stand Toileting - Clothing Manipulation Details (indicate cue type and reason): with multiple standing attempts made to complete hygiene and linen change.              Cognition Arousal/Alertness: Awake/alert Behavior During Therapy: WFL for tasks assessed/performed Overall Cognitive Status: Within Functional Limits for tasks assessed         General Comments: patients wife was present during session as well.              General Comments B LE edema    Pertinent Vitals/ Pain       Pain Assessment Pain Assessment: 0-10 Pain Score: 8  Pain Location: B LEs, R knee > R ankle Pain Descriptors / Indicators: Cramping, Constant, Discomfort Pain Intervention(s): Limited activity within patient's tolerance, Monitored during session (education on importance of pain management for progress in therapy.)         Frequency  Min 1X/week        Progress Toward Goals  OT Goals(current goals can now be found in the care plan section)  Progress towards OT goals: Progressing toward goals     Plan Discharge plan remains appropriate    Co-evaluation    PT/OT/SLP Co-Evaluation/Treatment: Yes Reason for Co-Treatment: Complexity of the patient's impairments (multi-system involvement);For patient/therapist safety;To address functional/ADL transfers PT goals addressed during session: Mobility/safety with mobility;Balance;Proper use of DME;Strengthening/ROM OT goals addressed during session: ADL's and self-care;Proper use of Adaptive equipment and DME      AM-PAC OT "6 Clicks" Daily Activity     Outcome Measure   Help from another person eating meals?: None Help from  another person taking care of personal grooming?: A Little Help from another person toileting, which includes using toliet, bedpan, or urinal?: Total Help from another person bathing (including washing, rinsing, drying)?: A Lot Help from another person to put on and taking off regular upper body clothing?: A Little Help from another person to put on and taking off regular lower body clothing?: Total 6 Click Score: 14    End of Session Equipment Utilized During Treatment: Rolling walker (2 wheels);Gait belt  OT Visit Diagnosis: Unsteadiness on feet (R26.81);Other abnormalities of gait and mobility (R26.89);Muscle weakness (generalized) (M62.81)   Activity Tolerance Patient tolerated treatment well;Patient limited by pain   Patient Left with call bell/phone within reach;with family/visitor present;in bed   Nurse Communication Mobility status        Time: 4098-1191 OT Time Calculation (min): 32 min  Charges: OT General Charges $OT Visit: 1 Visit OT Treatments $Self Care/Home Management : 8-22 mins  Rosalio Loud, MS Acute Rehabilitation Department Office# [phone]   Selinda Flavin 12/30/2022, 12:52 PM

## 2022-12-30 NOTE — Progress Notes (Signed)
Physical Therapy Treatment Patient Details Name: Raymond [name] Weare MRN: [id number] DOB: September 11, 1965 Today's Date: 12/30/2022   History of Present Illness 57 yo male presents to therapy following hospital admission on 12/24/2022 due to progressive R LE pain and B LE weakness and edema impacting functional mobility. Pt sustained several recent falls and once of which resulted in R lateral malleolus fx 12/06/2022 pt presented to ED and d/c home with brace. Pt had follow up OP with orthopedic 7/14 and continued recommendation for conservative management and R LE WBAT with CAM boot donned for 6-8 weeks. Pt subsequently fell again on 7/21 resulting in increased LE pain and edema and presented to ED no acute fx and d/c home. Pt returned to ED on 7/24 indicating he has been bed bound since 7/21 due to inability to WB through R LE with worsening B knee pain R > L.and new onset of LE weakness. Pt R lateral malleolar fx is now slightly displaced distal fibular fx and orthopedics consulted with ongoing recommendation for conservative management. DVT negative. Pt also found to have anasarca with B LE edema and suspect for cellulitis with wound weeping. CT of head and spine negative for acute findings. Pt has PMH of DM II, tobacco abuse, COPD, gout, HLD, HTN, OSA on CPAP.    PT Comments   Pt admitted with above diagnosis.  Pt currently with functional limitations due to the deficits listed below (see PT Problem List). Pt seated EOB with therapist arrived. Pt agreeable to therapy intervention. Pt indicated increased R LE pain today R knee > R ankle. Pt required max A x 2 for all sit to stand  trails from elevated EOB with limited standing tolerance. Pt and family ed provided on recommendation for need for increased supervision and assist at time of d/c and patient will benefit from continued inpatient follow up therapy, <3 hours/day. Pt left seated EOB per pt preference and all needs in place. PT able to provide pt, family and NT ed  on R LE WBAT with CAM boot donned only. Pt will benefit from acute skilled PT to increase their independence and safety with mobility to allow discharge.      If plan is discharge home, recommend the following: Two people to help with walking and/or transfers;A lot of help with bathing/dressing/bathroom;Assistance with cooking/housework;Assist for transportation   Can travel by private vehicle        Equipment Recommendations  None recommended by PT (pt reports DME in home setting)    Recommendations for Other Services       Precautions / Restrictions Precautions Precautions: Fall Required Braces or Orthoses: Other Brace Other Brace: R CAM boot Restrictions Weight Bearing Restrictions: Yes RLE Weight Bearing: Weight bearing as tolerated Other Position/Activity Restrictions: RLE WBAT with CAM boot donned, R ankle ROM as tolerated     Mobility  Bed Mobility Overal bed mobility: Modified Independent             General bed mobility comments: pt seated EOB when PT arrived    Transfers Overall transfer level: Needs assistance Equipment used: Rolling walker (2 wheels) Transfers: Sit to/from Stand Sit to Stand: +2 physical assistance, From elevated surface, Max assist           General transfer comment: pt indicated feeling like his knees were crunchy today R > L. L standard shoe and R CAM boot donned with total assist. PT and OT and NT attempted to have pt stand on weight scale and with 2  attepmts and max A x 2 and pull to stand unable to obtian standing weight. pt required therapist to block R knee due to instabiltiy and indciated 8/10 pain, subsequent trails with pull to stand to RW from elevated EOB with max A x 2 and with 2 trials pt unable to maintain static assisted standing wtih mod A x 2 for . 30s exhibiting a decline from prior interventions. PT able to remove RW and stand anteriorly to pt and OT to R affected side blocking R knee able to assist to standing for NT to  perfom hygiene ~ 45s    Ambulation/Gait               General Gait Details: NT pt was requriing increased physical assist and not safe for pt or therapist to trail gait tasks   Stairs             Wheelchair Mobility     Tilt Bed    Modified Rankin (Stroke Patients Only)       Balance Overall balance assessment: History of Falls, Needs assistance Sitting-balance support: Feet supported Sitting balance-Leahy Scale: Good     Standing balance support: Bilateral upper extremity supported, During functional activity, Reliant on assistive device for balance Standing balance-Leahy Scale: Zero Standing balance comment: pt exhibited minimal abilty to assist to maintain static standing today                            Cognition Arousal/Alertness: Awake/alert Behavior During Therapy: WFL for tasks assessed/performed Overall Cognitive Status: Within Functional Limits for tasks assessed                                          Exercises General Exercises - Lower Extremity Ankle Circles/Pumps: AROM, Right, Seated, 10 reps (PT guided pt through R ankle ROM DF/PF and inversion and eversion to pt tolerance)    General Comments General comments (skin integrity, edema, etc.): B LE edema      Pertinent Vitals/Pain Pain Assessment Pain Assessment: 0-10 Pain Score: 8  Pain Location: B LEs, R knee > R ankle Pain Descriptors / Indicators: Cramping, Constant, Discomfort Pain Intervention(s): Limited activity within patient's tolerance, Monitored during session (ed provided on importance of pain management)    Home Living                          Prior Function            PT Goals (current goals can now be found in the care plan section) Acute Rehab PT Goals 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 PT Goal Formulation: With patient Time For Goal Achievement: 01/16/23 Potential to Achieve Goals:  Good Progress towards PT goals: Progressing toward goals (limited progress demonstrated today pt limited by pain and fatigue)    Frequency    Min 1X/week      PT Plan Current plan remains appropriate    Co-evaluation PT/OT/SLP Co-Evaluation/Treatment: Yes Reason for Co-Treatment: Complexity of the patient's impairments (multi-system involvement);For patient/therapist safety;To address functional/ADL transfers PT goals addressed during session: Mobility/safety with mobility;Balance;Proper use of DME;Strengthening/ROM OT goals addressed during session: ADL's and self-care;Proper use of Adaptive equipment and DME      AM-PAC PT "6 Clicks" Mobility   Outcome Measure  Help needed  turning from your back to your side while in a flat bed without using bedrails?: None Help needed moving from lying on your back to sitting on the side of a flat bed without using bedrails?: None Help needed moving to and from a bed to a chair (including a wheelchair)?: Total Help needed standing up from a chair using your arms (e.g., wheelchair or bedside chair)?: Total Help needed to walk in hospital room?: Total Help needed climbing 3-5 steps with a railing? : Total 6 Click Score: 12    End of Session Equipment Utilized During Treatment: Gait belt Activity Tolerance: Patient limited by fatigue;No increased pain Patient left: in bed;with call bell/phone within reach;with family/visitor present (seated EOB for lunch) Nurse Communication: Mobility status;Need for lift equipment (total lift to get OOB at this time) PT Visit Diagnosis: Unsteadiness on feet (R26.81);Other abnormalities of gait and mobility (R26.89);Repeated falls (R29.6);Difficulty in walking, not elsewhere classified (R26.2);Pain Pain - Right/Left:  (B) Pain - part of body: Knee;Leg     Time: 6063-0160 PT Time Calculation (min) (ACUTE ONLY): 33 min  Charges:    $Therapeutic Activity: 8-22 mins PT General Charges $$ ACUTE PT VISIT:  1 Visit                     Johnny Bridge, PT Acute Rehab    Jacqualyn Posey 12/30/2022, 12:05 PM

## 2022-12-30 NOTE — Progress Notes (Signed)
Progress Note   Patient: Raymond [name] VOJ:[id number] DOB: 1966-04-09 DOA: 12/24/2022     6 DOS: the patient was seen and examined on 12/30/2022   Brief hospital course: 57 y.o. male with medical history significant of obesity and other medical problems as listed below.  Patient was in his usual state of health till approximately December 06, 2022 when patient describes having chronic leg weakness with buckling of legs below him.  Patient actually presented to ER affiliated with [facility] health.  An x-ray of the right ankle revealed acute fracture of the lateral malleolus.  I am advised by the wife and patient that there is no skin injury overlying the fracture.  Patient was treated in the ER and discharged home with a brace.  Patient subsequently followed up with outpatient orthopedic surgeon on December 14, 2022.  I do not have access to office records from the orthopedic visit on December 14, 2022.  However the wife advises that the plan was for serial x-ray monitoring of the fracture, conservative management with a brace for about 6 to 8 weeks.   Unfortunately on December 21, 2022, 3 days ago, patient had another fall that is attributed to instability of gait due to right ankle fracture.  Patient has since then been reporting numbness on the right lateral thigh.  Patient does not report any new focal weakness since then nor any back pain or bladder or bowel changes.  However patient does report having progressive leg swelling bilateral since then.  Which is worse than his chronic leg swelling.  Patient was evaluated at ER of [facility] on December 21, 2022 subsequent to his fall.  Patient is also evaluated at the same time on December 21, 2022 for right knee pain.  Patient had x-ray testing done of areas of concern, no new acute fracture was found, patient was discharged home.   However wife describes that the patient has pretty much been bedbound since his discharge from [facility] ER on December 21, 2022.  Patient is not able to bear weight on the  right lower extremity.  Patient ascribes this to right knee pain.  Patient was seen at outpatient orthopedic clinic today, and given his poor functional status, transferred to [facility] inpatient at [facility] long.   Patient is now seen at [facility] long hospital in the inpatient unit on a telemetry bed.  As mentioned above patient has developed worsening lower extremity swelling over the last 2 to 3 weeks.  There is no chest pain or shortness of breath.  Patient has developed progressive worsening gait instability with pretty much bedbound status for the last 3 days.  Assessment and Plan: Displaced R Fibular fracture -recent fall with lateral malleolar fracture now with slighly displaced fibular fx -Cont with analgesia as tolerated -Have consulted Orthopedic Surgery. Recommendations for conservative management and PT noted -Pt did report fall with injury to head and back. Ordered and reviewed CT thoracic, lumbar spine and CT head. Unremarkable -PT/OT is following. Recs for SNF noted. TOC following  Tobacco abuse -cessation done this visit -Continue with nicotine patch as needed   Type 2 diabetes mellitus, without long-term current use of insulin (HCC) -cont hold oral meds while in hospital -cont with SSI as needed -glycemic trends   Anasarca with BLE edema -Patient's edema is reportedly worse over the last 3 weeks as per wife and patient.   -LE dopplers reviewed, neg for DVT -Monitor I/O and daily wts -recommend elevate legs at rest -2d echo reviewed, normal  LVEF with normal RF function -continues to have very good results with IV lasix, LE remains edematous, albeit improved -Increase lasix to 80mg  IV bid -Peak wt 164.4kg  Hypokalemia -will replace -recheck bmet in AM  Low vitamin D -vit d of 13.9 -continue vit D supplement     Subjective States feeling better today  Physical Exam: Vitals:   12/29/22 0538 12/29/22 1414 12/29/22 2036 12/30/22 1312  BP: (!) 154/67 (!) 170/82 (!)  169/87 (!) 148/87  Pulse: 99 100 99 94  Resp: 14 16 16 18   Temp: 97.7 F (36.5 C) 97.9 F (36.6 C) 98.3 F (36.8 C) 99.1 F (37.3 C)  TempSrc: Oral Oral Oral Oral  SpO2: (!) 89% 97% 96% 96%  Weight:      Height:       General exam: Conversant, in no acute distress Respiratory system: normal chest rise, clear, no audible wheezing Cardiovascular system: regular rhythm, s1-s2 Gastrointestinal system: Nondistended, nontender, pos BS Central nervous system: No seizures, no tremors Extremities: No cyanosis, no joint deformities Skin: No rashes, no pallor Psychiatry: Affect normal // no auditory hallucinations   Data Reviewed:  Labs reviewed: Na 136, K 3.3, Cr 1.48, WBC 10.4, Hgb 9.5   Family Communication: Pt in room, family at bedside  Disposition: Status is: Inpatient Remains inpatient appropriate because: severity of illness  Planned Discharge Destination: Skilled nursing facility     Author: Rickey Barbara, MD 12/30/2022 3:46 PM  For on call review [email or web address].

## 2022-12-30 NOTE — TOC Progression Note (Signed)
Transition of Care [facility]) - Progression Note    Patient Details  Name: Shawntel Linde Moise MRN: [id number] Date of Birth: September 14, 1965  Transition of Care [facility]) CM/SW Contact  Beckie Busing, RN Phone [phone]  12/30/2022, 4:01 PM  Clinical Narrative:    [facility] acknowledges recommendations for SNF. Workup in progress. Note to follow.      Barriers to Discharge: Continued Medical Work up  Expected Discharge Plan and Services                                               Social Determinants of Health (SDOH) Interventions SDOH Screenings   Food Insecurity: No Food Insecurity (12/24/2022)  Housing: Low Risk  (12/24/2022)  Transportation Needs: No Transportation Needs (12/24/2022)  Utilities: Not At Risk (12/24/2022)  Social Connections: Unknown (01/10/2022)   Received from [facility], [facility]  Stress: No Stress Concern Present (03/21/2022)   Received from [facility], [facility]  Tobacco Use: High Risk (12/24/2022)    Readmission Risk Interventions    12/25/2022    4:24 PM  Readmission Risk Prevention Plan  Post Dischage Appt Complete  Medication Screening Complete  Transportation Screening Complete

## 2022-12-31 ENCOUNTER — Inpatient Hospital Stay (HOSPITAL_COMMUNITY): Payer: Managed Care, Other (non HMO)

## 2022-12-31 DIAGNOSIS — R627 Adult failure to thrive: Secondary | ICD-10-CM | POA: Diagnosis not present

## 2022-12-31 LAB — GLUCOSE, CAPILLARY
Glucose-Capillary: 139 mg/dL — ABNORMAL HIGH (ref 70–99)
Glucose-Capillary: 166 mg/dL — ABNORMAL HIGH (ref 70–99)
Glucose-Capillary: 167 mg/dL — ABNORMAL HIGH (ref 70–99)
Glucose-Capillary: 148 mg/dL — ABNORMAL HIGH (ref 70–99)
Glucose-Capillary: 153 mg/dL — ABNORMAL HIGH (ref 70–99)

## 2022-12-31 MED ORDER — SORBITOL 70 % SOLN
960.0000 mL | TOPICAL_OIL | Freq: Once | ORAL | Status: AC
  Administered 2023-01-01: 960 mL via RECTAL
  Filled 2022-12-31: qty 240

## 2022-12-31 MED ORDER — METHOCARBAMOL 500 MG PO TABS
750.0000 mg | ORAL_TABLET | Freq: Four times a day (QID) | ORAL | Status: DC | PRN
  Administered 2022-12-31 – 2023-01-12 (×22): 750 mg via ORAL
  Filled 2022-12-31 (×22): qty 2

## 2022-12-31 NOTE — Progress Notes (Signed)
Subjective: Patient reports pain as severe, worse in the knees than R ankle that is fractured. Tolerating diet. Fluid is being restricted. Measuring I&Os. Getting IV Lasix to try and decrease B/L leg edema. Somewhat better but still very swollen. Urinating. No CP, SOB.  Has tried to work with Pt/OT on mobilizing OOB but very limited and trouble standing >30 secs. Says R leg has felt numb since his fall in the beginning of July when he hit his head and broke his ankle.  Objective:   VITALS:   Vitals:   12/31/22 0500 12/31/22 0516 12/31/22 1144 12/31/22 1327  BP:  (!) 154/77  (!) 174/93  Pulse:  82  65  Resp:  20  18  Temp:  98.3 F (36.8 C)    TempSrc:  Oral    SpO2:  94%  93%  Weight: (!) 159.9 kg  (!) 155 kg   Height:          Latest Ref Rng & Units 12/31/2022    6:08 AM 12/30/2022    5:39 AM 12/26/2022    5:32 AM  CBC  WBC 4.0 - 10.5 K/uL 9.5  10.4  8.8   Hemoglobin 13.0 - 17.0 g/dL 9.9  9.5  29.5   Hematocrit 39.0 - 52.0 % 30.7  29.6  33.3   Platelets 150 - 400 K/uL 305  277  264       Latest Ref Rng & Units 12/31/2022    6:08 AM 12/30/2022    5:39 AM 12/29/2022    7:42 AM  BMP  Glucose 70 - 99 mg/dL 621  308  657   BUN 6 - 20 mg/dL 23  22  20    Creatinine 0.61 - 1.24 mg/dL 8.46  9.62  9.52   Sodium 135 - 145 mmol/L 139  136  135   Potassium 3.5 - 5.1 mmol/L 3.6  3.3  3.6   Chloride 98 - 111 mmol/L 100  98  98   CO2 22 - 32 mmol/L 28  27  25    Calcium 8.9 - 10.3 mg/dL 9.5  9.3  9.3    Intake/Output      07/30 0701 07/31 0700 07/31 0701 08/01 0700   P.O. 240 600   Total Intake(mL/kg) 240 (1.5) 600 (3.9)   Urine (mL/kg/hr) 2000 (0.5) 1450 (1.2)   Total Output 2000 1450   Net -1760 -850           Physical Exam: General: NAD.  Sitting up on edge of bed, calm Resp: No increased wob Cardio: regular rate and rhythm ABD soft Neurologically intact MSK Neurovascularly intact Sensation decreased distally Pitting edema B/L lower legs L>R with areas of erythema  and evidence of venostasis with skin wounds Dorsiflexion/Plantar flexion decreased in R ankle  TTP lateral aspect of R ankle Several areas of ecchymosis and scabs on upper arms Decreased B/L knee ROM d/t pain    Assessment:    RIGHT distal fibula fracture Severe B/L knee OA  Principal Problem:   Failure to thrive in adult Active Problems:   Edema of extremities   Type 2 diabetes mellitus, without long-term current use of insulin (HCC)   Tobacco abuse    Plan: B/L knee injections  Up with therapy Incentive Spirometry Elevate and Apply ice  Weightbearing: WBAT RLE in CAM boot Orthopedic device(s): CAM boot Showering: Keep dressing dry VTE prophylaxis: Lovenox 40mg  qd , SCDs, ambulation Pain control: PRN Follow - up plan:  TBD Contact  information:  Margarita Rana MD, Levester Fresh PA-C  Dispo:  TBD. PT hasn't been able to do much with him because of pain in the knees and leg swelling. I will inject his B/L knees today with 2cc Marcaine and 1cc Kenolog  to each knee to hopefully help decrease the knee pain and help him mobilize better. While the Laxis is helping, I think he needs even more aggressive measure to get the swelling down as it is holding him back from progressing with PT. Leg wrapping for severe edema Florea be an option. Unsure if wound care does that.      Jenne Pane, PA-C Office [phone] 12/31/2022, 2:39 PM

## 2022-12-31 NOTE — NC FL2 (Signed)
[place] MEDICAID [id number] LEVEL OF CARE FORM     IDENTIFICATION  Patient Name: Raymond [name] Birthdate: 05-21-1966 Sex: male Admission Date (Current Location): 12/24/2022  [facility] and [place] Number:  Producer, television/film/video and Address:  [facility]  [address] [place], [place] [address]      Provider Number: [id number]  Attending Physician Name and Address:  Narda Bonds, MD  Relative Name and Phone Number:  Tiney Rouge [phone]    Current Level of Care: Hospital Recommended Level of Care: Skilled Nursing Facility Prior Approval Number:    Date Approved/Denied:   PASRR Number:    Discharge Plan: SNF    Current Diagnoses: Patient Active Problem List   Diagnosis Date Noted   Hyperlipidemia 12/24/2022   Failure to thrive in adult 12/24/2022   Tobacco abuse 03/18/2018   BPH associated with nocturia 03/11/2017   History of substance abuse (HCC) 03/11/2017   Restless leg syndrome 03/11/2017   Restrictive lung disease 05/14/2016   OSA on CPAP 05/14/2016   Moderate COPD (chronic obstructive pulmonary disease) (HCC) 05/14/2016   Smoking greater than 40 pack years 05/14/2016   SOB (shortness of breath) 10/06/2014   Edema of extremities 10/06/2014   Benign essential HTN 10/06/2014   Type 2 diabetes mellitus, without long-term current use of insulin (HCC) 07/18/2011    Orientation RESPIRATION BLADDER Height & Weight     Self, Time, Situation, Place  Normal, Other (Comment) (Dyspnea with exertion) Continent Weight: (!) 159.9 kg Height:  5\' 11"  (180.3 cm)  BEHAVIORAL SYMPTOMS/MOOD NEUROLOGICAL BOWEL NUTRITION STATUS      Continent Diet  AMBULATORY STATUS COMMUNICATION OF NEEDS Skin   Extensive Assist   Normal                       Personal Care Assistance Level of Assistance  Bathing, Feeding, Dressing, Total care Bathing Assistance: Maximum assistance Feeding assistance: Independent Dressing Assistance: Limited assistance Total Care Assistance:  Limited assistance   Functional Limitations Info  Sight, Hearing, Speech Sight Info: Adequate Hearing Info: Adequate Speech Info: Adequate    SPECIAL CARE FACTORS FREQUENCY  PT (By licensed PT), OT (By licensed OT)     PT Frequency: PT X 5 per week OT Frequency: OT x 5 per week            Contractures Contractures Info: Not present    Additional Factors Info  Allergies   Allergies Info: Sulfa antibiotics           Current Medications (12/31/2022):  This is the current hospital active medication list Current Facility-Administered Medications  Medication Dose Route Frequency Provider Last Rate Last Admin   albuterol (PROVENTIL) (2.5 MG/3ML) 0.083% nebulizer solution 3 mL  3 mL Inhalation Q4H PRN Nolberto Hanlon, MD       allopurinol (ZYLOPRIM) tablet 300 mg  300 mg Oral Daily Nolberto Hanlon, MD   300 mg at 12/31/22 0272   ALPRAZolam Prudy Feeler) tablet 0.5 mg  0.5 mg Oral BID PRN Nolberto Hanlon, MD   0.5 mg at 12/30/22 2011   atorvastatin (LIPITOR) tablet 10 mg  10 mg Oral Sherene Sires, MD   10 mg at 12/30/22 2125   cholecalciferol (VITAMIN D3) 25 MCG (1000 UNIT) tablet 1,000 Units  1,000 Units Oral Daily Jerald Kief, MD   1,000 Units at 12/31/22 5366   docusate sodium (COLACE) capsule 100 mg  100 mg Oral BID Jerald Kief, MD   100 mg  at 12/31/22 0811   doxazosin (CARDURA) tablet 2 mg  2 mg Oral QHS Nolberto Hanlon, MD   2 mg at 12/30/22 2125   DULoxetine (CYMBALTA) DR capsule 60 mg  60 mg Oral Daily Nolberto Hanlon, MD   60 mg at 12/31/22 0813   enoxaparin (LOVENOX) injection 40 mg  40 mg Subcutaneous Q12H Jerald Kief, MD   40 mg at 12/31/22 1610   furosemide (LASIX) injection 80 mg  80 mg Intravenous BID Jerald Kief, MD   80 mg at 12/31/22 0810   insulin aspart (novoLOG) injection 0-15 Units  0-15 Units Subcutaneous TID WC Nolberto Hanlon, MD   2 Units at 12/31/22 0810   insulin aspart (novoLOG) injection 0-5 Units  0-5 Units Subcutaneous QHS Nolberto Hanlon, MD       losartan (COZAAR)  tablet 50 mg  50 mg Oral Daily Nolberto Hanlon, MD   50 mg at 12/31/22 9604   methocarbamol (ROBAXIN) tablet 500 mg  500 mg Oral Q6H PRN West Bali, PA-C   500 mg at 12/31/22 0130   nicotine (NICODERM CQ - dosed in mg/24 hours) patch 14 mg  14 mg Transdermal Daily Nolberto Hanlon, MD   14 mg at 12/29/22 1005   nicotine polacrilex (NICORETTE) gum 2 mg  2 mg Oral PRN Nolberto Hanlon, MD       oxyCODONE (Oxy IR/ROXICODONE) immediate release tablet 5 mg  5 mg Oral Q4H PRN Nolberto Hanlon, MD   5 mg at 12/31/22 0811   pantoprazole (PROTONIX) EC tablet 20 mg  20 mg Oral Daily Nolberto Hanlon, MD   20 mg at 12/31/22 5409   polyethylene glycol (MIRALAX / GLYCOLAX) packet 17 g  17 g Oral BID Jerald Kief, MD   17 g at 12/31/22 8119   senna-docusate (Senokot-S) tablet 1 tablet  1 tablet Oral BID Nolberto Hanlon, MD   1 tablet at 12/31/22 1478   sodium chloride flush (NS) 0.9 % injection 3 mL  3 mL Intravenous Conchita Paris, MD   3 mL at 12/31/22 0813     Discharge Medications: Please see discharge summary for a list of discharge medications.  Relevant Imaging Results:  Relevant Lab Results:   Additional Information    Harriett Sine, RN

## 2022-12-31 NOTE — Plan of Care (Signed)

## 2022-12-31 NOTE — TOC Initial Note (Signed)
Transition of Care [facility]) - Initial/Assessment Note    Patient Details  Name: Raymond [name] MRN: [id number] Date of Birth: 04-09-1966  Transition of Care [facility]) CM/SW Contact:    Beckie Busing, RN Phone [phone]  12/31/2022, 4:01 PM  Clinical Narrative:                 TOC following patient with SNF recommendations. CM at bedside to discuss disposition plan and to offer choice. Patient states that he is from home with his significant other where he normally functions independently. Patient states that he is eager to get back on his feet and to his baseline. Patient states that he does have a PCP and he follows up with on a regular basis. Patient does have access to medications and states that they are affordable. Patient states that he has transportation to appointments. Patient states that he has DME at home (wheelchair, walker). Patient is agreeable to SNF for short term rehab and does not have a preference at this time. CM made patient aware that CM would provide a list once we have bed offers. FL2 has been completed  and faxed out for bed offers. TOC will continue to follow for disposition planning.     Barriers to Discharge: Continued Medical Work up   Patient Goals and CMS Choice            Expected Discharge Plan and Services                                              Prior Living Arrangements/Services                       Activities of Daily Living Home Assistive Devices/Equipment: Blood pressure cuff, CBG Meter ADL Screening (condition at time of admission) Patient's cognitive ability adequate to safely complete daily activities?: Yes Is the patient deaf or have difficulty hearing?: No Does the patient have difficulty seeing, even when wearing glasses/contacts?: No Does the patient have difficulty concentrating, remembering, or making decisions?: No Patient able to express need for assistance with ADLs?: Yes Does the patient have  difficulty dressing or bathing?: Yes Independently performs ADLs?: No Communication: Independent Dressing (OT): Needs assistance Is this a change from baseline?: Change from baseline, expected to last >3 days Grooming: Independent Feeding: Independent Bathing: Needs assistance Is this a change from baseline?: Change from baseline, expected to last <3 days Toileting: Needs assistance Is this a change from baseline?: Change from baseline, expected to last <3 days In/Out Bed: Needs assistance Is this a change from baseline?: Change from baseline, expected to last <3 days Walks in Home: Dependent, Needs assistance Is this a change from baseline?: Change from baseline, expected to last <3 days Does the patient have difficulty walking or climbing stairs?: Yes Weakness of Legs: Both Weakness of Arms/Hands: None  Permission Sought/Granted                  Emotional Assessment              Admission diagnosis:  Failure to thrive in adult [R62.7] Patient Active Problem List   Diagnosis Date Noted   Hyperlipidemia 12/24/2022   Failure to thrive in adult 12/24/2022   Tobacco abuse 03/18/2018   BPH associated with nocturia 03/11/2017   History of substance abuse (HCC) 03/11/2017   Restless  leg syndrome 03/11/2017   Restrictive lung disease 05/14/2016   OSA on CPAP 05/14/2016   Moderate COPD (chronic obstructive pulmonary disease) (HCC) 05/14/2016   Smoking greater than 40 pack years 05/14/2016   SOB (shortness of breath) 10/06/2014   Edema of extremities 10/06/2014   Benign essential HTN 10/06/2014   Type 2 diabetes mellitus, without long-term current use of insulin (HCC) 07/18/2011   PCP:  Venetia Constable, MD Pharmacy:   [facility] [address], [place] - [address] [place] [address] Phone: [phone] Fax: [phone]     Social Determinants of Health (SDOH) Social History: SDOH Screenings   Food  Insecurity: No Food Insecurity (12/24/2022)  Housing: Low Risk  (12/24/2022)  Transportation Needs: No Transportation Needs (12/24/2022)  Utilities: Not At Risk (12/24/2022)  Social Connections: Unknown (01/10/2022)   Received from [facility]  Stress: No Stress Concern Present (03/21/2022)   Received from [facility], [facility]  Tobacco Use: High Risk (12/24/2022)   SDOH Interventions:     Readmission Risk Interventions    12/25/2022    4:24 PM  Readmission Risk Prevention Plan  Post Dischage Appt Complete  Medication Screening Complete  Transportation Screening Complete

## 2022-12-31 NOTE — Plan of Care (Signed)

## 2022-12-31 NOTE — Progress Notes (Signed)
PROGRESS NOTE    Raymond [name]  WUJ:[id number] DOB: 07/15/1965 DOA: 12/24/2022 PCP: Venetia Constable, MD   Brief Narrative: Raymond [name] is a 57 y.o. male with a history of obesity, hypertension, CKD, tobacco use, diabetes mellitus.  Patient presented secondary to poor functional status and worsening lower extremity edema.  Patient seen by orthopedic surgery with recommendation for conservative management of right fibril fracture.  Patient underwent diuresis for lower extremity edema.  Patient evaluated by physical and Occupational Therapy with recommendation for SNF on discharge.   Assessment and Plan:  Displaced right fibular fracture Slightly displaced fibular fracture noted on imaging in setting of recent fall.  Orthopedic surgery was consulted with recommendation for conservative management.  Per orthopedic surgery, patient is weightbearing as tolerated to right lower extremity in cam boot and is nonweightbearing to right lower extremity out of cam boot.  Anasarca Progressively worsening.  Significant lower extremity edema.  No extremity venous duplex negative for DVT.  Transthoracic echocardiogram significant for normal LVEF with normal RV function.  Patient started on IV Lasix for diuresis with good response.  Creatinine appears to be trending upwards, although baseline appears to be around 1.5. -Continue Lasix IV -BMP in a.m.  Diabetes mellitus type 2 -Continue SSI  Tobacco use Patient received cessation counseling this admission. -Continue nicotine patch  Hypokalemia Likely related to IV diuresis.  Potassium repleted as needed.  Hypertension -Continue losartan for now, Graffeo need to discontinue while diuresing  CKD stage IIIa Baseline creatinine appears to be around 1.5.  Right lower extremity numbness Per patient and his wife, patient started having symptoms after a fall involving a fall on his back.  No associated back pain.  CT scan of lumbar spine significant  for foraminal stenosis greatest at L4-5 with right greater than left in addition to moderate foraminal stenosis bilaterally at L3-4.  Right leg is hyperreflexic. -Check MRI lumbar spine  Vitamin D deficiency -Continue vitamin D supplementation  Constipation -Continue -SMOG enema   DVT prophylaxis: Lovenox Code Status:   Code Status: Full Code Family Communication: Wife at bedside Disposition Plan: Discharge to SNF once transition to oral regimen for diuresis if needed   Consultants:  Orthopedic surgery  Procedures:  7/25: Transthoracic echocardiogram  Antimicrobials: None   Subjective: Patient reports feeling very well today.  [name] is felt this admission.  He is eager to have his knees injected by orthopedic surgery today and he thinks this will help with his ambulation.  Patient reports right lower extremity numbness that starts from his thigh and goes down past his knee mostly anteriorly no associated focal weakness noted.  Per wife, she has noticed this issue since a fall prior to admission.  Objective: BP (!) 174/93 (BP Location: Left Arm)   Pulse 65   Temp 98.3 F (36.8 C) (Oral)   Resp 18   Ht 5\' 11"  (1.803 m)   Wt (!) 155 kg   SpO2 93%   BMI 47.66 kg/m   Examination:  General exam: Appears calm and comfortable Respiratory system: Clear to auscultation. Respiratory effort normal. Cardiovascular system: S1 & S2 heard, RRR. Gastrointestinal system: Abdomen is nondistended, soft and nontender. Normal bowel sounds heard. Central nervous system: Alert and oriented.  Equal bilateral lower extremity strength.  2+ left patellar reflex compared to 3+ right patellar reflex without clonus Musculoskeletal: Significant bilateral lower extremity 2+ edema. No calf tenderness Psychiatry: Judgement and insight appear normal. Mood & affect appropriate.    Data  Reviewed: I have personally reviewed following labs and imaging studies  CBC Lab Results  Component  Value Date   WBC 9.5 12/31/2022   RBC 3.22 (L) 12/31/2022   HGB 9.9 (L) 12/31/2022   HCT 30.7 (L) 12/31/2022   MCV 95.3 12/31/2022   MCH 30.7 12/31/2022   PLT 305 12/31/2022   MCHC 32.2 12/31/2022   RDW 16.0 (H) 12/31/2022   LYMPHSABS 1.7 12/24/2022   MONOABS 0.9 12/24/2022   EOSABS 0.5 12/24/2022   BASOSABS 0.1 12/24/2022     Last metabolic panel Lab Results  Component Value Date   NA 139 12/31/2022   K 3.6 12/31/2022   CL 100 12/31/2022   CO2 28 12/31/2022   BUN 23 (H) 12/31/2022   CREATININE 1.53 (H) 12/31/2022   GLUCOSE 147 (H) 12/31/2022   GFRNONAA 52 (L) 12/31/2022   GFRAA >90 07/19/2014   CALCIUM 9.5 12/31/2022   PROT 7.2 12/31/2022   ALBUMIN 3.3 (L) 12/31/2022   BILITOT 0.7 12/31/2022   ALKPHOS 81 12/31/2022   AST 20 12/31/2022   ALT 28 12/31/2022   ANIONGAP 11 12/31/2022    GFR: Estimated Creatinine Clearance: 78.8 mL/min (A) (by C-G formula based on SCr of 1.53 mg/dL (H)).  No results found for this or any previous visit (from the past 240 hour(s)).    Radiology Studies: No results found.    LOS: 7 days    Jacquelin Hawking, MD [facility] 12/31/2022, 3:51 PM   If 7PM-7AM, please contact night-coverage [email or web address]

## 2023-01-01 DIAGNOSIS — R627 Adult failure to thrive: Secondary | ICD-10-CM | POA: Diagnosis not present

## 2023-01-01 LAB — GLUCOSE, CAPILLARY
Glucose-Capillary: 141 mg/dL — ABNORMAL HIGH (ref 70–99)
Glucose-Capillary: 146 mg/dL — ABNORMAL HIGH (ref 70–99)
Glucose-Capillary: 142 mg/dL — ABNORMAL HIGH (ref 70–99)
Glucose-Capillary: 144 mg/dL — ABNORMAL HIGH (ref 70–99)

## 2023-01-01 LAB — BASIC METABOLIC PANEL
Anion gap: 11 (ref 5–15)
BUN: 26 mg/dL — ABNORMAL HIGH (ref 6–20)
CO2: 27 mmol/L (ref 22–32)
Calcium: 9.4 mg/dL (ref 8.9–10.3)
Chloride: 97 mmol/L — ABNORMAL LOW (ref 98–111)
Creatinine, Ser: 1.49 mg/dL — ABNORMAL HIGH (ref 0.61–1.24)
GFR, Estimated: 54 mL/min — ABNORMAL LOW (ref >60)
Glucose, Bld: 150 mg/dL — ABNORMAL HIGH (ref 70–99)
Potassium: 3.3 mmol/L — ABNORMAL LOW (ref 3.5–5.1)
Sodium: 135 mmol/L (ref 135–145)

## 2023-01-01 MED ORDER — AMLODIPINE BESYLATE 5 MG PO TABS
5.0000 mg | ORAL_TABLET | Freq: Every day | ORAL | Status: DC
  Administered 2023-01-01 – 2023-01-03 (×3): 5 mg via ORAL
  Filled 2023-01-01 (×3): qty 1

## 2023-01-01 NOTE — Plan of Care (Signed)

## 2023-01-01 NOTE — Plan of Care (Signed)
  Problem: Education: Goal: Knowledge of General Education information will improve Description Including pain rating scale, medication(s)/side effects and non-pharmacologic comfort measures Outcome: Progressing   Problem: Health Behavior/Discharge Planning: Goal: Ability to manage health-related needs will improve Outcome: Progressing   

## 2023-01-01 NOTE — Progress Notes (Signed)
   01/01/23 2105  BiPAP/CPAP/SIPAP  BiPAP/CPAP/SIPAP Pt Type Adult (Patient prefers self placement when ready for his CPAP and was already wearing his CPAP when I checked on him.)  BiPAP/CPAP/SIPAP DREAMSTATIOND  Mask Type Full face mask  Mask Size Large  FiO2 (%) 21 %  Patient Home Equipment No  Auto Titrate Yes

## 2023-01-01 NOTE — Progress Notes (Signed)
PROGRESS NOTE    Raymond [name]  BJY:[id number] DOB: 04-13-1966 DOA: 12/24/2022 PCP: Venetia Constable, MD   Brief Narrative: Raymond [name] is a 57 y.o. male with a history of obesity, hypertension, CKD, tobacco use, diabetes mellitus.  Patient presented secondary to poor functional status and worsening lower extremity edema.  Patient seen by orthopedic surgery with recommendation for conservative management of right fibril fracture.  Patient underwent diuresis for lower extremity edema.  Patient evaluated by physical and Occupational Therapy with recommendation for SNF on discharge.   Assessment and Plan:  Displaced right fibular fracture Slightly displaced fibular fracture noted on imaging in setting of recent fall.  Orthopedic surgery was consulted with recommendation for conservative management.  Per orthopedic surgery, patient is weightbearing as tolerated to right lower extremity in cam boot and is nonweightbearing to right lower extremity out of cam boot.  Anasarca Progressively worsening.  Significant lower extremity edema.  No extremity venous duplex negative for DVT.  Transthoracic echocardiogram significant for normal LVEF with normal RV function.  Patient started on IV Lasix for diuresis with good response.  Creatinine appears to be trending upwards, although baseline appears to be around 1.5. -Continue Lasix IV -BMP in a.m.  Diabetes mellitus type 2 -Continue SSI  Tobacco use Patient received cessation counseling this admission. -Continue nicotine patch  Hypokalemia Likely related to IV diuresis.  Potassium repleted as needed.  Hypertension Uncontrolled. Asymptomatic.  -Continue losartan -Add home amlodipine  CKD stage IIIa Baseline creatinine appears to be around 1.5.  Right lower extremity numbness Per patient and his wife, patient started having symptoms after a fall involving a fall on his back.  No associated back pain.  CT scan of lumbar spine significant  for foraminal stenosis greatest at L4-5 with right greater than left in addition to moderate foraminal stenosis bilaterally at L3-4.  Right leg is hyperreflexic. MRI without critical pathology. Patient should follow-up outpatient with orthopedic surgery.  Vitamin D deficiency -Continue vitamin D supplementation  Constipation -Continue MiraLAX   DVT prophylaxis: Lovenox Code Status:   Code Status: Full Code Family Communication: Friend at bedside Disposition Plan: Discharge to SNF once bed is available and transition to oral regimen for diuresis if needed   Consultants:  Orthopedic surgery  Procedures:  7/25: Transthoracic echocardiogram  Antimicrobials: None   Subjective: Patient with no specific issues overnight.  Patient with continued diuresis.  Objective: BP (!) 173/109 (BP Location: Right Arm)   Pulse 79   Temp 98.2 F (36.8 C) (Oral)   Resp 18   Ht 5\' 11"  (1.803 m)   Wt 111.2 kg   SpO2 95%   BMI 34.19 kg/m   Examination:  General exam: Appears calm and comfortable Respiratory system: Clear to auscultation. Respiratory effort normal. Cardiovascular system: S1 & S2 heard, RRR. Gastrointestinal system: Abdomen is nondistended, soft and nontender. Normal bowel sounds heard. Central nervous system: Alert and oriented. No focal neurological deficits. Musculoskeletal: Significant bilateral lower extremity pitting edema. No calf tenderness Psychiatry: Judgement and insight appear normal. Mood & affect appropriate.    Data Reviewed: I have personally reviewed following labs and imaging studies  CBC Lab Results  Component Value Date   WBC 9.5 12/31/2022   RBC 3.22 (L) 12/31/2022   HGB 9.9 (L) 12/31/2022   HCT 30.7 (L) 12/31/2022   MCV 95.3 12/31/2022   MCH 30.7 12/31/2022   PLT 305 12/31/2022   MCHC 32.2 12/31/2022   RDW 16.0 (H) 12/31/2022   LYMPHSABS 1.7  12/24/2022   MONOABS 0.9 12/24/2022   EOSABS 0.5 12/24/2022   BASOSABS 0.1 12/24/2022     Last  metabolic panel Lab Results  Component Value Date   NA 135 01/01/2023   K 3.3 (L) 01/01/2023   CL 97 (L) 01/01/2023   CO2 27 01/01/2023   BUN 26 (H) 01/01/2023   CREATININE 1.49 (H) 01/01/2023   GLUCOSE 150 (H) 01/01/2023   GFRNONAA 54 (L) 01/01/2023   GFRAA >90 07/19/2014   CALCIUM 9.4 01/01/2023   PROT 7.2 12/31/2022   ALBUMIN 3.3 (L) 12/31/2022   BILITOT 0.7 12/31/2022   ALKPHOS 81 12/31/2022   AST 20 12/31/2022   ALT 28 12/31/2022   ANIONGAP 11 01/01/2023    GFR: Estimated Creatinine Clearance: 67.7 mL/min (A) (by C-G formula based on SCr of 1.49 mg/dL (H)).  No results found for this or any previous visit (from the past 240 hour(s)).    Radiology Studies: MR LUMBAR SPINE WO CONTRAST  Result Date: 01/01/2023 CLINICAL DATA:  Initial evaluation for lumbar radiculopathy. Trauma. EXAM: MRI LUMBAR SPINE WITHOUT CONTRAST TECHNIQUE: Multiplanar, multisequence MR imaging of the lumbar spine was performed. No intravenous contrast was administered. COMPARISON:  Prior CT from 12/25/2022. FINDINGS: Segmentation:  Examination moderately degraded by motion. Standard segmentation. Lowest well-formed disc space labeled the L5-S1 level. Alignment: Vertebral bodies normally aligned with preservation of the normal lumbar lordosis. Trace degenerative retrolisthesis of L2 on L3. Vertebrae: Vertebral body height maintained without acute or chronic fracture. Bone marrow signal intensity within normal limits. Small benign hemangiomata noted within the L2 and L3 vertebral bodies. No worrisome osseous lesions. No abnormal marrow edema. Conus medullaris and cauda equina: Conus extends to the T12-L1 level. Conus and cauda equina appear normal. Paraspinal and other soft tissues: Paraspinous soft tissues demonstrate no significant finding. Apparent mildly increased STIR signal intensity within the posterior paraspinous region noted, favored to be technical and/or artifactual on this exam. Disc levels: T12-L1:  Disc desiccation with mild disc bulge. No canal or foraminal stenosis. L1-2: Disc desiccation without significant disc bulge. Mild facet hypertrophy. No stenosis. L2-3: Disc desiccation without significant disc bulge. Minimal facet spurring. No spinal stenosis. Foramina remain patent. L3-4: Disc desiccation with mild disc bulge. Mild bilateral facet hypertrophy. No significant spinal stenosis. Mild bilateral L3 foraminal narrowing. L4-5: Disc desiccation with diffuse disc bulge. Superimposed endplate Schmorl's node deformities with mild reactive endplate spurring noted. Superimposed left subarticular to foraminal disc protrusion (series 8, image 31). Mild to moderate bilateral facet hypertrophy. Associated trace joint effusions noted. Resultant mild right with moderate left lateral recess stenosis. Central canal remains patent. Moderate right with severe left L4 foraminal narrowing. L5-S1: Tiny central disc protrusion minimally indents the ventral thecal sac. Mild bilateral facet hypertrophy with associated trace joint effusions. No spinal stenosis. Mild bilateral L5 foraminal narrowing. IMPRESSION: 1. No acute abnormality within the lumbar spine. 2. Degenerative spondylosis with left subarticular to foraminal disc protrusion at L4-5, resulting in moderate left lateral recess stenosis, with severe left and moderate right L4 foraminal narrowing. 3. Additional mild noncompressive disc bulging and facet hypertrophy at L3-4 and L5-S1 with resultant mild bilateral L3 and L5 foraminal stenosis. Electronically Signed   By: Rise Mu M.D.   On: 01/01/2023 06:31      LOS: 8 days    Jacquelin Hawking, MD [facility] 01/01/2023, 2:02 PM   If 7PM-7AM, please contact night-coverage [email or web address]

## 2023-01-01 NOTE — Progress Notes (Signed)
   01/01/23 0130  BiPAP/CPAP/SIPAP  BiPAP/CPAP/SIPAP Pt Type Adult  BiPAP/CPAP/SIPAP DREAMSTATIOND  Mask Type Full face mask  Mask Size Large  FiO2 (%) 21 %  Patient Home Equipment No  Auto Titrate Yes

## 2023-01-02 DIAGNOSIS — R627 Adult failure to thrive: Secondary | ICD-10-CM | POA: Diagnosis not present

## 2023-01-02 LAB — GLUCOSE, CAPILLARY
Glucose-Capillary: 167 mg/dL — ABNORMAL HIGH (ref 70–99)
Glucose-Capillary: 198 mg/dL — ABNORMAL HIGH (ref 70–99)
Glucose-Capillary: 123 mg/dL — ABNORMAL HIGH (ref 70–99)
Glucose-Capillary: 125 mg/dL — ABNORMAL HIGH (ref 70–99)

## 2023-01-02 LAB — BASIC METABOLIC PANEL
Anion gap: 11 (ref 5–15)
BUN: 24 mg/dL — ABNORMAL HIGH (ref 6–20)
CO2: 28 mmol/L (ref 22–32)
Calcium: 9.3 mg/dL (ref 8.9–10.3)
Chloride: 96 mmol/L — ABNORMAL LOW (ref 98–111)
Creatinine, Ser: 1.45 mg/dL — ABNORMAL HIGH (ref 0.61–1.24)
GFR, Estimated: 56 mL/min — ABNORMAL LOW (ref >60)
Glucose, Bld: 164 mg/dL — ABNORMAL HIGH (ref 70–99)
Potassium: 3.1 mmol/L — ABNORMAL LOW (ref 3.5–5.1)
Sodium: 135 mmol/L (ref 135–145)

## 2023-01-02 MED ORDER — POTASSIUM CHLORIDE CRYS ER 20 MEQ PO TBCR
40.0000 meq | EXTENDED_RELEASE_TABLET | ORAL | Status: AC
  Administered 2023-01-02 (×2): 40 meq via ORAL
  Filled 2023-01-02 (×2): qty 2

## 2023-01-02 NOTE — Progress Notes (Signed)
PROGRESS NOTE    Raymond [name]  WGN:[id number] DOB: 07/09/1965 DOA: 12/24/2022 PCP: Venetia Constable, MD   Brief Narrative: Raymond [name] is a 57 y.o. male with a history of obesity, hypertension, CKD, tobacco use, diabetes mellitus.  Patient presented secondary to poor functional status and worsening lower extremity edema.  Patient seen by orthopedic surgery with recommendation for conservative management of right fibril fracture.  Patient underwent diuresis for lower extremity edema.  Patient evaluated by physical and Occupational Therapy with recommendation for SNF on discharge.   Assessment and Plan:  Displaced right fibular fracture Slightly displaced fibular fracture noted on imaging in setting of recent fall.  Orthopedic surgery was consulted with recommendation for conservative management.  Per orthopedic surgery, patient is weightbearing as tolerated to right lower extremity in cam boot and is nonweightbearing to right lower extremity out of cam boot.  Anasarca Significant lower extremity edema.  No extremity venous duplex negative for DVT.  Transthoracic echocardiogram significant for normal LVEF with normal RV function.  Patient started on IV Lasix for diuresis with good response.  Creatinine appears to be stable. -Continue Lasix IV -BMP in a.m. while on Lasix IV  Diabetes mellitus type 2 -Continue SSI  Tobacco use Patient received cessation counseling this admission. -Continue nicotine patch  Hypokalemia Likely related to IV diuresis.  Potassium repleted as needed.  Hypertension Uncontrolled. Asymptomatic.  -Continue losartan -Continue home amlodipine; Koper increase to amlodipine 10 mg daily, although this Debois contribute to LE swelling  CKD stage IIIa Baseline creatinine appears to be around 1.5.  Right lower extremity numbness Per patient and his wife, patient started having symptoms after a fall involving a fall on his back.  No associated back pain.  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  Right leg is hyperreflexic. MRI without critical pathology. Patient should follow-up outpatient with orthopedic surgery.  Vitamin D deficiency -Continue vitamin D supplementation  Constipation -Continue MiraLAX   DVT prophylaxis: Lovenox Code Status:   Code Status: Full Code Family Communication: Wife at bedside Disposition Plan: Discharge to SNF once bed is available. Can transition to oral Lasix on discharge.   Consultants:  Orthopedic surgery  Procedures:  7/25: Transthoracic echocardiogram  Antimicrobials: None   Subjective: No concerns this morning. Swelling is slowly improving.  Objective: BP (!) 178/83 (BP Location: Right Arm)   Pulse 78   Temp 97.8 F (36.6 C) (Oral)   Resp 19   Ht 5\' 11"  (1.803 m)   Wt 111.1 kg   SpO2 96%   BMI 34.17 kg/m   Examination:  General exam: Appears calm and comfortable Respiratory system: Clear to auscultation. Respiratory effort normal. Cardiovascular system: S1 & S2 heard, RRR Gastrointestinal system: Abdomen is nondistended, soft and nontender. Normal bowel sounds heard. Central nervous system: Alert and oriented. No focal neurological deficits. Musculoskeletal: BLE edema. No calf tenderness Psychiatry: Judgement and insight appear normal. Mood & affect appropriate.    Data Reviewed: I have personally reviewed following labs and imaging studies  CBC Lab Results  Component Value Date   WBC 9.5 12/31/2022   RBC 3.22 (L) 12/31/2022   HGB 9.9 (L) 12/31/2022   HCT 30.7 (L) 12/31/2022   MCV 95.3 12/31/2022   MCH 30.7 12/31/2022   PLT 305 12/31/2022   MCHC 32.2 12/31/2022   RDW 16.0 (H) 12/31/2022   LYMPHSABS 1.7 12/24/2022  MONOABS 0.9 12/24/2022   EOSABS 0.5 12/24/2022   BASOSABS 0.1 12/24/2022     Last metabolic panel Lab Results  Component Value  Date   NA 135 01/02/2023   K 3.1 (L) 01/02/2023   CL 96 (L) 01/02/2023   CO2 28 01/02/2023   BUN 24 (H) 01/02/2023   CREATININE 1.45 (H) 01/02/2023   GLUCOSE 164 (H) 01/02/2023   GFRNONAA 56 (L) 01/02/2023   GFRAA >90 07/19/2014   CALCIUM 9.3 01/02/2023   PROT 7.2 12/31/2022   ALBUMIN 3.3 (L) 12/31/2022   BILITOT 0.7 12/31/2022   ALKPHOS 81 12/31/2022   AST 20 12/31/2022   ALT 28 12/31/2022   ANIONGAP 11 01/02/2023    GFR: Estimated Creatinine Clearance: 69.5 mL/min (A) (by C-G formula based on SCr of 1.45 mg/dL (H)).  No results found for this or any previous visit (from the past 240 hour(s)).    Radiology Studies: MR LUMBAR SPINE WO CONTRAST  Result Date: 01/01/2023 CLINICAL DATA:  Initial evaluation for lumbar radiculopathy. Trauma. EXAM: MRI LUMBAR SPINE WITHOUT CONTRAST TECHNIQUE: Multiplanar, multisequence MR imaging of the lumbar spine was performed. No intravenous contrast was administered. COMPARISON:  Prior CT from 12/25/2022. FINDINGS: Segmentation:  Examination moderately degraded by motion. Standard segmentation. Lowest well-formed disc space labeled the L5-S1 level. Alignment: Vertebral bodies normally aligned with preservation of the normal lumbar lordosis. Trace degenerative retrolisthesis of L2 on L3. Vertebrae: Vertebral body height maintained without acute or chronic fracture. Bone marrow signal intensity within normal limits. Small benign hemangiomata noted within the L2 and L3 vertebral bodies. No worrisome osseous lesions. No abnormal marrow edema. Conus medullaris and cauda equina: Conus extends to the T12-L1 level. Conus and cauda equina appear normal. Paraspinal and other soft tissues: Paraspinous soft tissues demonstrate no significant finding. Apparent mildly increased STIR signal intensity within the posterior paraspinous region noted, favored to be technical and/or artifactual on this exam. Disc levels: T12-L1: Disc desiccation with mild disc bulge. No canal  or foraminal stenosis. L1-2: Disc desiccation without significant disc bulge. Mild facet hypertrophy. No stenosis. L2-3: Disc desiccation without significant disc bulge. Minimal facet spurring. No spinal stenosis. Foramina remain patent. L3-4: Disc desiccation with mild disc bulge. Mild bilateral facet hypertrophy. No significant spinal stenosis. Mild bilateral L3 foraminal narrowing. L4-5: Disc desiccation with diffuse disc bulge. Superimposed endplate Schmorl's node deformities with mild reactive endplate spurring noted. Superimposed left subarticular to foraminal disc protrusion (series 8, image 31). Mild to moderate bilateral facet hypertrophy. Associated trace joint effusions noted. Resultant mild right with moderate left lateral recess stenosis. Central canal remains patent. Moderate right with severe left L4 foraminal narrowing. L5-S1: Tiny central disc protrusion minimally indents the ventral thecal sac. Mild bilateral facet hypertrophy with associated trace joint effusions. No spinal stenosis. Mild bilateral L5 foraminal narrowing. IMPRESSION: 1. No acute abnormality within the lumbar spine. 2. Degenerative spondylosis with left subarticular to foraminal disc protrusion at L4-5, resulting in moderate left lateral recess stenosis, with severe left and moderate right L4 foraminal narrowing. 3. Additional mild noncompressive disc bulging and facet hypertrophy at L3-4 and L5-S1 with resultant mild bilateral L3 and L5 foraminal stenosis. Electronically Signed   By: Rise Mu M.D.   On: 01/01/2023 06:31      LOS: 9 days    Jacquelin Hawking, MD [facility] 01/02/2023, 3:20 PM   If 7PM-7AM, please contact night-coverage [email or web address]

## 2023-01-02 NOTE — Progress Notes (Signed)
Physical Therapy Treatment Patient Details Name: Raymond [name] MRN: [id number] DOB: Dec 19, 1965 Today's Date: 01/02/2023   History of Present Illness 57 yo male presents to therapy following hospital admission on 12/24/2022 due to progressive R LE pain and B LE weakness and edema impacting functional mobility. Pt sustained several recent falls and once of which resulted in R lateral malleolus fx 12/06/2022 pt presented to ED and d/c home with brace. Pt had follow up OP with orthopedic 7/14 and continued recommendation for conservative management and R LE WBAT with CAM boot donned for 6-8 weeks. Pt subsequently fell again on 7/21 resulting in increased LE pain and edema and presented to ED no acute fx and d/c home. Pt returned to ED on 7/24 indicating he has been bed bound since 7/21 due to inability to WB through R LE with worsening B knee pain R > L.and new onset of LE weakness. Pt R lateral malleolar fx is now slightly displaced distal fibular fx and orthopedics consulted with ongoing recommendation for conservative management. DVT negative. Pt also found to have anasarca with B LE edema and suspect for cellulitis with wound weeping. CT of head and spine negative for acute findings. Pt has PMH of DM II, tobacco abuse, COPD, gout, HLD, HTN, OSA on CPAP.    PT Comments   Pt admitted with above diagnosis.  Pt currently with functional limitations due to the deficits listed below (see PT Problem List). PT arrived and pt seated in hospital wc. Pt reports he was able to get out of bed with the "stand lift" and it felt so go to be able to get out of bed and the room. Pt reported NT was to return and assist him back to bed. PT able to utilize steady lift for therapy intervention today. Pt required min A x 2 for sit to stand  from wc and cues for extension posture once in standing, pt reported R LE instability and knee pain and noted mm spasms requiring pt to sit in lift. Pt able to stand from lift with assist as above and  maintain standing for 30s in lift with min guard. Focus on eccentric control to EOB. Pt is mod I for supine <> sit. Pt reported he was going to transition to rehab Monday. Patient will benefit from continued inpatient follow up therapy, <3 hours/day. Pt left seated EOB, all needs in place, CAM boot and standard shoe doffed per pt preference and wife present. Pt will benefit from acute skilled PT to increase their independence and safety with mobility to allow discharge.      If plan is discharge home, recommend the following: Two people to help with walking and/or transfers;A lot of help with bathing/dressing/bathroom;Assistance with cooking/housework;Assist for transportation   Can travel by private vehicle        Equipment Recommendations  None recommended by PT (pt reports DME in home setting)    Recommendations for Other Services       Precautions / Restrictions Precautions Precautions: Fall Required Braces or Orthoses: Other Brace Other Brace: R CAM boot Restrictions Weight Bearing Restrictions: Yes RLE Weight Bearing: Weight bearing as tolerated Other Position/Activity Restrictions: RLE WBAT with CAM boot donned, R ankle ROM as tolerated     Mobility  Bed Mobility Overal bed mobility: Modified Independent Bed Mobility: Supine to Sit, Sit to Supine                Transfers Overall transfer level: Needs assistance Equipment used:  (Steady)  Transfers: Sit to/from Stand Sit to Stand: Min assist, +2 safety/equipment           General transfer comment: noted R knee instabilty when transitioning into standing with use of steady lift. pt able to maintain static standing with min guard in lift for 30s. pt required min A x 2 for sit to stand in lift, pt continues to exhibit difficuty with eccentric controll to sitting surfaces. R CAM boot and L standard shoe donned    Ambulation/Gait               General Gait Details: NT pt was requriing increased physical  assist and not safe for pt or therapist to trail gait tasks   Stairs             Wheelchair Mobility     Tilt Bed    Modified Rankin (Stroke Patients Only)       Balance Overall balance assessment: History of Falls, Needs assistance Sitting-balance support: Feet supported Sitting balance-Leahy Scale: Good     Standing balance support: Bilateral upper extremity supported, During functional activity, Reliant on assistive device for balance Standing balance-Leahy Scale: Poor Standing balance comment: improved static standing in lift                            Cognition Arousal/Alertness: Awake/alert Behavior During Therapy: WFL for tasks assessed/performed Overall Cognitive Status: Within Functional Limits for tasks assessed                                 General Comments: patients wife was present during session        Exercises      General Comments General comments (skin integrity, edema, etc.): B LE edema noteably improving      Pertinent Vitals/Pain Pain Assessment Pain Assessment: 0-10 Pain Score: 8  Pain Location: B LEs, R knee > R ankle Pain Descriptors / Indicators: Cramping, Constant, Discomfort Pain Intervention(s): Limited activity within patient's tolerance, Monitored during session, Repositioned    Home Living                          Prior Function            PT Goals (current goals can now be found in the care plan section) Acute Rehab PT Goals 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 PT Goal Formulation: With patient Time For Goal Achievement: 01/16/23 Potential to Achieve Goals: Good Progress towards PT goals: Progressing toward goals    Frequency    Min 1X/week      PT Plan Current plan remains appropriate    Co-evaluation              AM-PAC PT "6 Clicks" Mobility   Outcome Measure  Help needed turning from your back to your side while in a flat bed  without using bedrails?: None Help needed moving from lying on your back to sitting on the side of a flat bed without using bedrails?: None Help needed moving to and from a bed to a chair (including a wheelchair)?: Total Help needed standing up from a chair using your arms (e.g., wheelchair or bedside chair)?: Total Help needed to walk in hospital room?: Total Help needed climbing 3-5 steps with a railing? : Total 6 Click Score: 12    End  of Session Equipment Utilized During Treatment: Gait belt (steady lift) Activity Tolerance: Patient limited by fatigue;No increased pain Patient left: in bed;with call bell/phone within reach;with family/visitor present (seated EOB) Nurse Communication: Mobility status;Need for lift equipment (steady to get OOB at this time) PT Visit Diagnosis: Unsteadiness on feet (R26.81);Other abnormalities of gait and mobility (R26.89);Repeated falls (R29.6);Difficulty in walking, not elsewhere classified (R26.2);Pain Pain - Right/Left:  (B) Pain - part of body: Knee;Leg     Time: 1140-1153 PT Time Calculation (min) (ACUTE ONLY): 13 min  Charges:    $Therapeutic Activity: 8-22 mins PT General Charges $$ ACUTE PT VISIT: 1 Visit                     Johnny Bridge, PT Acute Rehab    Jacqualyn Posey 01/02/2023, 12:52 PM

## 2023-01-02 NOTE — Progress Notes (Signed)
   01/02/23 2132  BiPAP/CPAP/SIPAP  BiPAP/CPAP/SIPAP Pt Type Adult (Patient prefers self placement when ready to sleep.  Machine plugged in and ready for use.)  BiPAP/CPAP/SIPAP DREAMSTATIOND  Mask Type Full face mask  Mask Size Large  FiO2 (%) 21 %  Patient Home Equipment No  Auto Titrate Yes

## 2023-01-03 DIAGNOSIS — R601 Generalized edema: Secondary | ICD-10-CM | POA: Diagnosis not present

## 2023-01-03 DIAGNOSIS — R627 Adult failure to thrive: Secondary | ICD-10-CM | POA: Diagnosis not present

## 2023-01-03 LAB — GLUCOSE, CAPILLARY
Glucose-Capillary: 154 mg/dL — ABNORMAL HIGH (ref 70–99)
Glucose-Capillary: 177 mg/dL — ABNORMAL HIGH (ref 70–99)
Glucose-Capillary: 124 mg/dL — ABNORMAL HIGH (ref 70–99)

## 2023-01-03 MED ORDER — LACTULOSE 10 GM/15ML PO SOLN: 10.0000 g | Freq: Every day | ORAL | Status: DC | PRN

## 2023-01-03 MED ORDER — AMLODIPINE BESYLATE 10 MG PO TABS
10.0000 mg | ORAL_TABLET | Freq: Every day | ORAL | Status: DC
  Administered 2023-01-04 – 2023-01-12 (×9): 10 mg via ORAL
  Filled 2023-01-03 (×9): qty 1

## 2023-01-03 NOTE — Progress Notes (Signed)
PROGRESS NOTE    Raymond [name]  WJX:[id number] DOB: 1965/10/16 DOA: 12/24/2022 PCP: Venetia Constable, MD   Brief Narrative: Raymond [name] is a 57 y.o. male with a history of obesity, hypertension, CKD, tobacco use, diabetes mellitus.  Patient presented secondary to poor functional status and worsening lower extremity edema.  Patient seen by orthopedic surgery with recommendation for conservative management of right fibril fracture.  Patient underwent diuresis for lower extremity edema.  Patient evaluated by physical and Occupational Therapy with recommendation for SNF on discharge.   Assessment and Plan:  Displaced right fibular fracture Slightly displaced fibular fracture noted on imaging in setting of recent fall.  Orthopedic surgery was consulted with recommendation for conservative management.  Per orthopedic surgery, patient is weightbearing as tolerated to right lower extremity in cam boot and is nonweightbearing to right lower extremity out of cam boot.  Anasarca Significant lower extremity edema.  No extremity venous duplex negative for DVT.  Transthoracic echocardiogram significant for normal LVEF with normal RV function.  Patient started on IV Lasix for diuresis with good response.  Creatinine appears to be stable. -Continue Lasix IV -BMP in a.m. while on Lasix IV  Diabetes mellitus type 2 -Continue SSI  Tobacco use Patient received cessation counseling this admission. -Continue nicotine patch  Hypokalemia Likely related to IV diuresis.  Potassium repleted as needed.  Hypertension Uncontrolled. Asymptomatic.  -Continue losartan -Continue home amlodipine; Locastro increase to amlodipine 10 mg daily, although this Riede contribute to LE swelling  CKD stage IIIa Baseline creatinine appears to be around 1.5.  Right lower extremity numbness Per patient and his wife, patient started having symptoms after a fall involving a fall on his back.  No associated back pain.  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  Right leg is hyperreflexic. MRI without critical pathology. Patient should follow-up outpatient with orthopedic surgery.  Vitamin D deficiency -Continue vitamin D supplementation  Constipation -Continue MiraLAX, Senokot, Colace -Add lactulose PRN   DVT prophylaxis: Lovenox Code Status:   Code Status: Full Code Family Communication: Wife at nurses station Disposition Plan: Discharge to SNF once bed is available. Can transition to oral Lasix on discharge.   Consultants:  Orthopedic surgery  Procedures:  7/25: Transthoracic echocardiogram  Antimicrobials: None   Subjective: Hoping to lose weight. No other concerns.  Objective: BP (!) 174/68 (BP Location: Left Arm)   Pulse (!) 57   Temp 98.2 F (36.8 C) (Oral)   Resp 18   Ht 5\' 11"  (1.803 m)   Wt (!) 157 kg   SpO2 99%   BMI 48.27 kg/m   Examination:  General exam: Appears calm and comfortable  Respiratory system: Clear to auscultation. Respiratory effort normal. Cardiovascular system: S1 & S2 heard, RRR. Gastrointestinal system: Abdomen is nondistended, soft and nontender. Normal bowel sounds heard. Central nervous system: Alert and oriented. No focal neurological deficits. Musculoskeletal: BLE pitting edema. No calf tenderness Psychiatry: Judgement and insight appear normal. Mood & affect appropriate.    Data Reviewed: I have personally reviewed following labs and imaging studies  CBC Lab Results  Component Value Date   WBC 9.5 12/31/2022   RBC 3.22 (L) 12/31/2022   HGB 9.9 (L) 12/31/2022   HCT 30.7 (L) 12/31/2022   MCV 95.3 12/31/2022   MCH 30.7 12/31/2022   PLT 305 12/31/2022   MCHC 32.2 12/31/2022   RDW  16.0 (H) 12/31/2022   LYMPHSABS 1.7 12/24/2022   MONOABS 0.9 12/24/2022   EOSABS 0.5 12/24/2022   BASOSABS 0.1 12/24/2022     Last  metabolic panel Lab Results  Component Value Date   NA 136 01/03/2023   K 3.5 01/03/2023   CL 98 01/03/2023   CO2 26 01/03/2023   BUN 24 (H) 01/03/2023   CREATININE 1.34 (H) 01/03/2023   GLUCOSE 154 (H) 01/03/2023   GFRNONAA >60 01/03/2023   GFRAA >90 07/19/2014   CALCIUM 9.6 01/03/2023   PROT 7.2 12/31/2022   ALBUMIN 3.3 (L) 12/31/2022   BILITOT 0.7 12/31/2022   ALKPHOS 81 12/31/2022   AST 20 12/31/2022   ALT 28 12/31/2022   ANIONGAP 12 01/03/2023    GFR: Estimated Creatinine Clearance: 90.7 mL/min (A) (by C-G formula based on SCr of 1.34 mg/dL (H)).  No results found for this or any previous visit (from the past 240 hour(s)).    Radiology Studies: No results found.    LOS: 10 days    Jacquelin Hawking, MD [facility] 01/03/2023, 1:04 PM   If 7PM-7AM, please contact night-coverage [email or web address]

## 2023-01-03 NOTE — Plan of Care (Signed)
  Problem: Education: Goal: Knowledge of General Education information will improve Description: Including pain rating scale, medication(s)/side effects and non-pharmacologic comfort measures Outcome: Progressing   Problem: Health Behavior/Discharge Planning: Goal: Ability to manage health-related needs will improve Outcome: Progressing   Problem: Clinical Measurements: Goal: Ability to maintain clinical measurements within normal limits will improve Outcome: Progressing Goal: Will remain free from infection Outcome: Progressing Goal: Diagnostic test results will improve Outcome: Progressing Goal: Respiratory complications will improve Outcome: Progressing Goal: Cardiovascular complication will be avoided Outcome: Progressing   Problem: Activity: Goal: Risk for activity intolerance will decrease Outcome: Progressing   Problem: Nutrition: Goal: Adequate nutrition will be maintained Outcome: Progressing   Problem: Coping: Goal: Level of anxiety will decrease Outcome: Progressing   Problem: Elimination: Goal: Will not experience complications related to bowel motility Outcome: Progressing Goal: Will not experience complications related to urinary retention Outcome: Progressing   Problem: Pain Managment: Goal: General experience of comfort will improve Outcome: Progressing   Problem: Safety: Goal: Ability to remain free from injury will improve Outcome: Progressing   Problem: Skin Integrity: Goal: Risk for impaired skin integrity will decrease Outcome: Progressing   Problem: Education: Goal: Ability to describe self-care measures that  prevent or decrease complications (Diabetes Survival Skills Education) will improve Outcome: Progressing Goal: Individualized Educational Video(s) Outcome: Progressing   Problem: Coping: Goal: Ability to adjust to condition or change in health will improve Outcome: Progressing   Problem: Fluid Volume: Goal: Ability to  maintain a balanced intake and output will improve Outcome: Progressing   Problem: Health Behavior/Discharge Planning: Goal: Ability to identify and utilize available resources and services will improve Outcome: Progressing Goal: Ability to manage health-related needs will improve Outcome: Progressing   Problem: Metabolic: Goal: Ability to maintain appropriate glucose levels will improve Outcome: Progressing   Problem: Nutritional: Goal: Maintenance of adequate nutrition will improve Outcome: Progressing Goal: Progress toward achieving an optimal weight will improve Outcome: Progressing   Problem: Skin Integrity: Goal: Risk for impaired skin integrity will decrease Outcome: Progressing   Problem: Tissue Perfusion: Goal: Adequacy of tissue perfusion will improve Outcome: Progressing   

## 2023-01-03 NOTE — TOC Progression Note (Signed)
Transition of Care [facility]) - Progression Note    Patient Details  Name: Kane Kusek Spranger MRN: [id number] Date of Birth: 1966-02-04  [facility] CM/SW Contact  Georgie Chard, LCSW Phone Number: 01/03/2023, 2:42 PM  Clinical Narrative:    CSW presented bed offers to the family/ patient again. At this time the patient has chosen [facility]. Whitney from intake was  informed. Facility has started [facility]. Family is aware. TOC will continue to follow up.   Expected Discharge Plan: Skilled Nursing Facility Barriers to Discharge: Continued Medical Work up  Expected Discharge Plan and Services                                               Social Determinants of Health (SDOH) Interventions SDOH Screenings   Food Insecurity: No Food Insecurity (12/24/2022)  Housing: Low Risk  (12/24/2022)  Transportation Needs: No Transportation Needs (12/24/2022)  Utilities: Not At Risk (12/24/2022)  Social Connections: Unknown (01/10/2022)   Received from [facility]  Stress: No Stress Concern Present (03/21/2022)   Received from [facility], [facility]  Tobacco Use: High Risk (12/24/2022)    Readmission Risk Interventions    12/25/2022    4:24 PM  Readmission Risk Prevention Plan  Post Dischage Appt Complete  Medication Screening Complete  Transportation Screening Complete

## 2023-01-04 DIAGNOSIS — R627 Adult failure to thrive: Secondary | ICD-10-CM | POA: Diagnosis not present

## 2023-01-04 DIAGNOSIS — R601 Generalized edema: Secondary | ICD-10-CM | POA: Diagnosis not present

## 2023-01-04 LAB — BASIC METABOLIC PANEL WITH GFR
Anion gap: 12 (ref 5–15)
BUN: 25 mg/dL — ABNORMAL HIGH (ref 6–20)
CO2: 27 mmol/L (ref 22–32)
Calcium: 9.6 mg/dL (ref 8.9–10.3)
Chloride: 97 mmol/L — ABNORMAL LOW (ref 98–111)
Creatinine, Ser: 1.39 mg/dL — ABNORMAL HIGH (ref 0.61–1.24)
GFR, Estimated: 58 mL/min — ABNORMAL LOW (ref >60)
Glucose, Bld: 192 mg/dL — ABNORMAL HIGH (ref 70–99)
Potassium: 3.3 mmol/L — ABNORMAL LOW (ref 3.5–5.1)
Sodium: 136 mmol/L (ref 135–145)

## 2023-01-04 LAB — GLUCOSE, CAPILLARY
Glucose-Capillary: 174 mg/dL — ABNORMAL HIGH (ref 70–99)
Glucose-Capillary: 135 mg/dL — ABNORMAL HIGH (ref 70–99)

## 2023-01-04 MED ORDER — POTASSIUM CHLORIDE CRYS ER 20 MEQ PO TBCR
40.0000 meq | EXTENDED_RELEASE_TABLET | Freq: Once | ORAL | Status: AC
  Administered 2023-01-04: 40 meq via ORAL
  Filled 2023-01-04: qty 2

## 2023-01-04 NOTE — Plan of Care (Signed)
  Problem: Education: Goal: Knowledge of General Education information will improve Description: Including pain rating scale, medication(s)/side effects and non-pharmacologic comfort measures Outcome: Progressing   Problem: Health Behavior/Discharge Planning: Goal: Ability to manage health-related needs will improve Outcome: Progressing   Problem: Clinical Measurements: Goal: Ability to maintain clinical measurements within normal limits will improve Outcome: Progressing Goal: Will remain free from infection Outcome: Progressing Goal: Diagnostic test results will improve Outcome: Progressing Goal: Respiratory complications will improve Outcome: Progressing Goal: Cardiovascular complication will be avoided Outcome: Progressing   Problem: Activity: Goal: Risk for activity intolerance will decrease Outcome: Progressing   Problem: Nutrition: Goal: Adequate nutrition will be maintained Outcome: Progressing   Problem: Coping: Goal: Level of anxiety will decrease Outcome: Progressing   Problem: Elimination: Goal: Will not experience complications related to bowel motility Outcome: Progressing Goal: Will not experience complications related to urinary retention Outcome: Progressing   Problem: Pain Managment: Goal: General experience of comfort will improve Outcome: Progressing   Problem: Safety: Goal: Ability to remain free from injury will improve Outcome: Progressing   Problem: Skin Integrity: Goal: Risk for impaired skin integrity will decrease Outcome: Progressing   Problem: Education: Goal: Ability to describe self-care measures that  prevent or decrease complications (Diabetes Survival Skills Education) will improve Outcome: Progressing Goal: Individualized Educational Video(s) Outcome: Progressing   Problem: Coping: Goal: Ability to adjust to condition or change in health will improve Outcome: Progressing   Problem: Fluid Volume: Goal: Ability to  maintain a balanced intake and output will improve Outcome: Progressing   Problem: Health Behavior/Discharge Planning: Goal: Ability to identify and utilize available resources and services will improve Outcome: Progressing Goal: Ability to manage health-related needs will improve Outcome: Progressing   Problem: Metabolic: Goal: Ability to maintain appropriate glucose levels will improve Outcome: Progressing   Problem: Nutritional: Goal: Maintenance of adequate nutrition will improve Outcome: Progressing Goal: Progress toward achieving an optimal weight will improve Outcome: Progressing   Problem: Skin Integrity: Goal: Risk for impaired skin integrity will decrease Outcome: Progressing   Problem: Tissue Perfusion: Goal: Adequacy of tissue perfusion will improve Outcome: Progressing   

## 2023-01-04 NOTE — Progress Notes (Signed)
PROGRESS NOTE    Raymond [name]  YQM:[id number] DOB: 11/06/1965 DOA: 12/24/2022 PCP: Venetia Constable, MD   Brief Narrative: Raymond [name] is a 57 y.o. male with a history of obesity, hypertension, CKD, tobacco use, diabetes mellitus.  Patient presented secondary to poor functional status and worsening lower extremity edema.  Patient seen by orthopedic surgery with recommendation for conservative management of right fibril fracture.  Patient underwent diuresis for lower extremity edema.  Patient evaluated by physical and Occupational Therapy with recommendation for SNF on discharge.   Assessment and Plan:  Displaced right fibular fracture Slightly displaced fibular fracture noted on imaging in setting of recent fall.  Orthopedic surgery was consulted with recommendation for conservative management.  Per orthopedic surgery, patient is weightbearing as tolerated to right lower extremity in cam boot and is nonweightbearing to right lower extremity out of cam boot.  Anasarca Significant lower extremity edema.  No extremity venous duplex negative for DVT.  Transthoracic echocardiogram significant for normal LVEF with normal RV function.  Patient started on IV Lasix for diuresis with good response.  Creatinine appears to be stable. -Continue Lasix IV -Daily BMP while on Lasix IV  Diabetes mellitus type 2 -Continue SSI  Tobacco use Patient received cessation counseling this admission. -Continue nicotine patch  Hypokalemia Likely related to IV diuresis.  Potassium repleted as needed.  Hypertension Uncontrolled. Asymptomatic.  -Continue losartan -Continue home amlodipine; Dettinger increase to amlodipine 10 mg daily, although this Karan contribute to LE swelling  CKD stage IIIa Baseline creatinine appears to be around 1.5.  Right lower extremity numbness Per patient and his wife, patient started having symptoms after a fall involving a fall on his back.  No associated back pain.  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  Right leg is hyperreflexic. MRI without critical pathology. Patient should follow-up outpatient with orthopedic surgery.  Vitamin D deficiency -Continue vitamin D supplementation  Constipation -Continue MiraLAX, Senokot, Colace -Lactulose PRN   DVT prophylaxis: Lovenox Code Status:   Code Status: Full Code Family Communication: None at bedside Disposition Plan: Discharge to SNF once bed is available. Can transition to oral Lasix on discharge.   Consultants:  Orthopedic surgery  Procedures:  7/25: Transthoracic echocardiogram  Antimicrobials: None   Subjective: No concerns today. Feeling better and better.  Objective: BP (!) 161/53 (BP Location: Left Arm)   Pulse 60   Temp 98.6 F (37 C) (Oral)   Resp 18   Ht 5\' 11"  (1.803 m)   Wt (!) 157 kg   SpO2 95%   BMI 48.27 kg/m   Examination:  General exam: Appears calm and comfortable Respiratory system: Clear to auscultation. Respiratory effort normal. Cardiovascular system: S1 & S2 heard, RRR. Gastrointestinal system: Abdomen is soft and nontender. No organomegaly or masses felt. Normal bowel sounds heard. Central nervous system: Alert and oriented. No focal neurological deficits. Musculoskeletal: Bilateral 2+ pitting edema. Skin: No cyanosis. No rashes Psychiatry: Judgement and insight appear normal. Mood & affect appropriate.    Data Reviewed: I have personally reviewed following labs and imaging studies  CBC Lab Results  Component Value Date   WBC 9.5 12/31/2022   RBC 3.22 (L) 12/31/2022   HGB 9.9 (L) 12/31/2022   HCT 30.7 (L) 12/31/2022   MCV 95.3 12/31/2022   MCH 30.7 12/31/2022   PLT 305 12/31/2022   MCHC 32.2 12/31/2022  RDW 16.0 (H) 12/31/2022   LYMPHSABS 1.7 12/24/2022   MONOABS 0.9 12/24/2022   EOSABS 0.5 12/24/2022   BASOSABS 0.1  12/24/2022     Last metabolic panel Lab Results  Component Value Date   NA 136 01/04/2023   K 3.3 (L) 01/04/2023   CL 97 (L) 01/04/2023   CO2 27 01/04/2023   BUN 25 (H) 01/04/2023   CREATININE 1.39 (H) 01/04/2023   GLUCOSE 192 (H) 01/04/2023   GFRNONAA 58 (L) 01/04/2023   GFRAA >90 07/19/2014   CALCIUM 9.6 01/04/2023   PROT 7.2 12/31/2022   ALBUMIN 3.3 (L) 12/31/2022   BILITOT 0.7 12/31/2022   ALKPHOS 81 12/31/2022   AST 20 12/31/2022   ALT 28 12/31/2022   ANIONGAP 12 01/04/2023    GFR: Estimated Creatinine Clearance: 87.4 mL/min (A) (by C-G formula based on SCr of 1.39 mg/dL (H)).  No results found for this or any previous visit (from the past 240 hour(s)).    Radiology Studies: No results found.    LOS: 11 days    Jacquelin Hawking, MD [facility] 01/04/2023, 1:21 PM   If 7PM-7AM, please contact night-coverage [email or web address]

## 2023-01-04 NOTE — Progress Notes (Signed)
Occupational Therapy Treatment Patient Details Name: Raymond [name] Pate MRN: [id number] DOB: 07-26-1965 Today's Date: 01/04/2023   History of present illness 57 yo male presents to therapy following hospital admission on 12/24/2022 due to progressive R LE pain and B LE weakness and edema impacting functional mobility. Pt sustained several recent falls and once of which resulted in R lateral malleolus fx 12/06/2022 pt presented to ED and d/c home with brace. Pt had follow up OP with orthopedic 7/14 and continued recommendation for conservative management and R LE WBAT with CAM boot donned for 6-8 weeks. Pt subsequently fell again on 7/21 resulting in increased LE pain and edema and presented to ED no acute fx and d/c home. Pt returned to ED on 7/24 indicating he has been bed bound since 7/21 due to inability to WB through R LE with worsening B knee pain R > L.and new onset of LE weakness. Pt R lateral malleolar fx is now slightly displaced distal fibular fx and orthopedics consulted with ongoing recommendation for conservative management. DVT negative. Pt also found to have anasarca with B LE edema and suspect for cellulitis with wound weeping. CT of head and spine negative for acute findings. Pt has PMH of DM II, tobacco abuse, COPD, gout, HLD, HTN, OSA on CPAP.   OT comments  Patient was pleasant and motivated to participate in session. Patient was able to scoot transfer to recliner in room with min A+2 with blocking knees to be able to scoot into recliner. Patient noted to have  continued buckling BLE with R >L with standing attempts with audible crepitus and pain. Patient inquired about diet changes to make to promote weight loss. Patient was recommended to speak with nutritionist. MD was messaged. Patient's discharge plan remains appropriate at this time. OT will continue to follow acutely.     Recommendations for follow up therapy are one component of a multi-disciplinary discharge planning process, led by the  attending physician.  Recommendations Pasion be updated based on patient status, additional functional criteria and insurance authorization.    Assistance Recommended at Discharge Frequent or constant Supervision/Assistance  Patient can return home with the following  Two people to help with walking and/or transfers;Two people to help with bathing/dressing/bathroom;Assist for transportation;Help with stairs or ramp for entrance   Equipment Recommendations  None recommended by OT       Precautions / Restrictions Precautions Precautions: Fall Required Braces or Orthoses: Other Brace Other Brace: R CAM boot Restrictions Weight Bearing Restrictions: No RLE Weight Bearing: Weight bearing as tolerated Other Position/Activity Restrictions: RLE WBAT with CAM boot donned, R ankle ROM as tolerated       Mobility Bed Mobility               General bed mobility comments: patient was sitting EOB upon arrival                      ADL either performed or assessed with clinical judgement   ADL Overall ADL's : Needs assistance/impaired   Eating/Feeding Details (indicate cue type and reason): patient inquired about diet changes to promote weight loss. patient was educated on speaking to nutritionist to ensure proper nutritional avenues during weight loss journey. message was sent to MD to ask if nutritionist can speak with patient while in hospital.                       Toilet Transfer Details (indicate cue type and reason): patient  was +2 for standing attempt with patient able to stand for about 45 seconds with R knee noted to buckle with increased pain with standing. patient had audible crepitus in shoulders with standing tasks.           General ADL Comments: patient was able to scoot transfer from edge of bed to recliner in room with increased time and +2 for sfaety with min A +2. patient needed BLE blocked to be able to push self into recliner. patient's nurse was  made aware of scooting to recliner. nurse verbalized understanding. patient was educated on keeping feet elevated to prevent further edema build up. patient was educated on alternatives from recliner foot rest to elevate feet with abducted positioning of BLE with resting.      Cognition Arousal/Alertness: Awake/alert Behavior During Therapy: WFL for tasks assessed/performed Overall Cognitive Status: Within Functional Limits for tasks assessed       General Comments: patients wife was present during session        Exercises Other Exercises Other Exercises: patient was educated on weight shifting sitting EOB to reduce risk of pressure build up on bottom with patient able to preform sidelying bilatearlly and return to sitting with no LOB or physical A. patient reported he would continue this while sitting EOB. patient was educated not to complete this while sitting in recliner with space limitations. patient verbalized understanding. Other Exercises: patient was educated on BUE and BLE movements to preform to reduce edema during rest times and time sitting EOB. patient veralized and demonstrated understanding.            Pertinent Vitals/ Pain       Pain Assessment Pain Assessment: Faces Faces Pain Scale: Hurts a little bit Pain Location: B LEs, R knee > R ankle Pain Descriptors / Indicators: Cramping, Constant, Discomfort Pain Intervention(s): Limited activity within patient's tolerance, Monitored during session         Frequency  Min 1X/week        Progress Toward Goals  OT Goals(current goals can now be found in the care plan section)  Progress towards OT goals: Progressing toward goals     Plan Discharge plan remains appropriate       AM-PAC OT "6 Clicks" Daily Activity     Outcome Measure   Help from another person eating meals?: None Help from another person taking care of personal grooming?: A Little Help from another person toileting, which includes using  toliet, bedpan, or urinal?: A Lot Help from another person bathing (including washing, rinsing, drying)?: A Lot Help from another person to put on and taking off regular upper body clothing?: A Little Help from another person to put on and taking off regular lower body clothing?: Total 6 Click Score: 15    End of Session Equipment Utilized During Treatment: Rolling walker (2 wheels);Gait belt  OT Visit Diagnosis: Unsteadiness on feet (R26.81);Other abnormalities of gait and mobility (R26.89);Muscle weakness (generalized) (M62.81)   Activity Tolerance Patient tolerated treatment well   Patient Left with call bell/phone within reach;with family/visitor present;in chair   Nurse Communication Mobility status        Time: 2952-8413 OT Time Calculation (min): 32 min  Charges: OT General Charges $OT Visit: 1 Visit OT Treatments $Self Care/Home Management : 23-37 mins  Rosalio Loud, MS Acute Rehabilitation Department Office# [phone]   Selinda Flavin 01/04/2023, 2:58 PM

## 2023-01-04 NOTE — Progress Notes (Signed)
   01/04/23 2216  BiPAP/CPAP/SIPAP  BiPAP/CPAP/SIPAP Pt Type Adult  BiPAP/CPAP/SIPAP DREAMSTATIOND  Mask Type Full face mask  Mask Size Large  FiO2 (%) 21 %  Patient Home Equipment No  Auto Titrate Yes

## 2023-01-05 ENCOUNTER — Inpatient Hospital Stay (HOSPITAL_COMMUNITY): Payer: Managed Care, Other (non HMO)

## 2023-01-05 DIAGNOSIS — R627 Adult failure to thrive: Secondary | ICD-10-CM | POA: Diagnosis not present

## 2023-01-05 DIAGNOSIS — R0989 Other specified symptoms and signs involving the circulatory and respiratory systems: Secondary | ICD-10-CM | POA: Diagnosis not present

## 2023-01-05 DIAGNOSIS — R601 Generalized edema: Secondary | ICD-10-CM | POA: Diagnosis not present

## 2023-01-05 LAB — GLUCOSE, CAPILLARY
Glucose-Capillary: 129 mg/dL — ABNORMAL HIGH (ref 70–99)
Glucose-Capillary: 167 mg/dL — ABNORMAL HIGH (ref 70–99)
Glucose-Capillary: 131 mg/dL — ABNORMAL HIGH (ref 70–99)
Glucose-Capillary: 143 mg/dL — ABNORMAL HIGH (ref 70–99)
Glucose-Capillary: 177 mg/dL — ABNORMAL HIGH (ref 70–99)

## 2023-01-05 MED ORDER — POTASSIUM CHLORIDE CRYS ER 20 MEQ PO TBCR
40.0000 meq | EXTENDED_RELEASE_TABLET | ORAL | Status: AC
  Administered 2023-01-05 (×2): 40 meq via ORAL
  Filled 2023-01-05 (×2): qty 2

## 2023-01-05 MED ORDER — HYDRALAZINE HCL 25 MG PO TABS
25.0000 mg | ORAL_TABLET | Freq: Four times a day (QID) | ORAL | Status: DC | PRN
  Administered 2023-01-05 – 2023-01-06 (×2): 25 mg via ORAL
  Filled 2023-01-05 (×2): qty 1

## 2023-01-05 NOTE — Progress Notes (Signed)
ABI's have been completed. Preliminary results can be found in CV Proc through chart review.   01/05/23 11:10 AM Olen Cordial RVT

## 2023-01-05 NOTE — Progress Notes (Signed)
   01/03/23 1935  BiPAP/CPAP/SIPAP  BiPAP/CPAP/SIPAP Pt Type Adult  BiPAP/CPAP/SIPAP DREAMSTATIOND  Mask Type Full face mask  Mask Size Large  FiO2 (%) 21 %  Patient Home Equipment No  Auto Titrate Yes  BiPAP/CPAP /SiPAP Vitals  Bilateral Breath Sounds Clear  MEWS Score/Color  MEWS Score 0  MEWS Score Color Green

## 2023-01-05 NOTE — Progress Notes (Signed)
PROGRESS NOTE    Raymond [name]  WGN:[id number] DOB: 02-25-1966 DOA: 12/24/2022 PCP: Venetia Constable, MD   Brief Narrative: Raymond [name] is a 57 y.o. male with a history of obesity, hypertension, CKD, tobacco use, diabetes mellitus.  Patient presented secondary to poor functional status and worsening lower extremity edema.  Patient seen by orthopedic surgery with recommendation for conservative management of right fibril fracture.  Patient underwent diuresis for lower extremity edema.  Patient evaluated by physical and Occupational Therapy with recommendation for SNF on discharge.   Assessment and Plan:  Displaced right fibular fracture Slightly displaced fibular fracture noted on imaging in setting of recent fall.  Orthopedic surgery was consulted with recommendation for conservative management.  Per orthopedic surgery, patient is weightbearing as tolerated to right lower extremity in cam boot and is nonweightbearing to right lower extremity out of cam boot.  Anasarca Significant lower extremity edema.  Lower extremity venous duplex negative for DVT.  Transthoracic echocardiogram significant for normal LVEF with normal RV function.  Patient started on IV Lasix for diuresis with good response.  Creatinine appears to be slightly increasing. -Continue Lasix IV -Daily BMP while on Lasix IV -Check ABIs and if normal, will add on LE compression hose  Diabetes mellitus type 2 -Continue SSI  Tobacco use Patient received cessation counseling this admission. -Continue nicotine patch  Hypokalemia Likely related to IV diuresis.  Potassium repleted as needed.  Hypertension Uncontrolled. Asymptomatic.  -Continue losartan, amlodipine, doxazosin -Add hydralazine PRN  CKD stage IIIa Baseline creatinine appears to be around 1.5.  Right lower extremity numbness Per patient and his wife, patient started having symptoms after a fall involving a fall on his back.  No associated back pain.   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  Right leg is hyperreflexic. MRI without critical pathology. Patient should follow-up outpatient with orthopedic surgery.  Vitamin D deficiency -Continue vitamin D supplementation  Constipation -Continue MiraLAX, Senokot, Colace -Lactulose PRN  Morbid obesity Estimated body mass index is 47.14 kg/m as calculated from the following:   Height as of this encounter: 5\' 11"  (1.803 m).   Weight as of this encounter: 153.3 kg. -Dietitian consult per patient request   DVT prophylaxis: Lovenox Code Status:   Code Status: Full Code Family Communication: None at bedside. Wife on telephone Disposition Plan: Discharge to SNF once bed is available. Can transition to oral Lasix on discharge.   Consultants:  Orthopedic surgery  Procedures:  7/25: Transthoracic echocardiogram  Antimicrobials: None   Subjective: Concerned about his blood pressure being high. No other concerns.  Objective: BP (!) 184/99 (BP Location: Right Arm) Comment: MD notified (). Day RN forwarded.  Pulse 68   Temp 98.8 F (37.1 C) (Oral)   Resp (!) 22   Ht 5\' 11"  (1.803 m)   Wt (!) 153.3 kg   SpO2 95%   BMI 47.14 kg/m   Examination:  General exam: Appears calm and comfortable Respiratory system: Clear to auscultation. Respiratory effort normal. Cardiovascular system: S1 & S2 heard, Normal rate with regular rhythm Gastrointestinal system: Abdomen is nondistended, soft and nontender.  Normal bowel sounds heard. Central nervous system: Alert and oriented. No focal neurological deficits. Musculoskeletal: 2+ left lower extremity edema. Psychiatry: Judgement and insight appear normal. Mood & affect appropriate.    Data Reviewed: I have personally reviewed following labs and imaging studies  CBC  Lab Results  Component Value Date   WBC  9.5 12/31/2022   RBC 3.22 (L) 12/31/2022   HGB 9.9 (L) 12/31/2022   HCT 30.7 (L) 12/31/2022   MCV 95.3 12/31/2022   MCH 30.7 12/31/2022   PLT 305 12/31/2022   MCHC 32.2 12/31/2022   RDW 16.0 (H) 12/31/2022   LYMPHSABS 1.7 12/24/2022   MONOABS 0.9 12/24/2022   EOSABS 0.5 12/24/2022   BASOSABS 0.1 12/24/2022     Last metabolic panel Lab Results  Component Value Date   NA 135 01/05/2023   K 3.3 (L) 01/05/2023   CL 99 01/05/2023   CO2 26 01/05/2023   BUN 26 (H) 01/05/2023   CREATININE 1.46 (H) 01/05/2023   GLUCOSE 136 (H) 01/05/2023   GFRNONAA 55 (L) 01/05/2023   GFRAA >90 07/19/2014   CALCIUM 9.2 01/05/2023   PROT 7.2 12/31/2022   ALBUMIN 3.3 (L) 12/31/2022   BILITOT 0.7 12/31/2022   ALKPHOS 81 12/31/2022   AST 20 12/31/2022   ALT 28 12/31/2022   ANIONGAP 10 01/05/2023    GFR: Estimated Creatinine Clearance: 82.1 mL/min (A) (by C-G formula based on SCr of 1.46 mg/dL (H)).  No results found for this or any previous visit (from the past 240 hour(s)).    Radiology Studies: No results found.    LOS: 12 days    Jacquelin Hawking, MD [facility] 01/05/2023, 9:41 AM   If 7PM-7AM, please contact night-coverage [email or web address]

## 2023-01-05 NOTE — Progress Notes (Signed)
Physical Therapy Treatment Patient Details Name: Raymond [name] MRN: [id number] DOB: 11-15-1965 Today's Date: 01/05/2023   History of Present Illness 57 yo male presents to therapy following hospital admission on 12/24/2022 due to progressive R LE pain and B LE weakness and edema impacting functional mobility. Pt sustained several recent falls and once of which resulted in R lateral malleolus fx 12/06/2022 pt presented to ED and d/c home with brace. Pt had follow up OP with orthopedic 7/14 and continued recommendation for conservative management and R LE WBAT with CAM boot donned for 6-8 weeks. Pt subsequently fell again on 7/21 resulting in increased LE pain and edema and presented to ED no acute fx and d/c home. Pt returned to ED on 7/24 indicating he has been bed bound since 7/21 due to inability to WB through R LE with worsening B knee pain R > L.and new onset of LE weakness. Pt R lateral malleolar fx is now slightly displaced distal fibular fx and orthopedics consulted with ongoing recommendation for conservative management. DVT negative. Pt also found to have anasarca with B LE edema and suspect for cellulitis with wound weeping. CT of head and spine negative for acute findings. Pt has PMH of DM II, tobacco abuse, COPD, gout, HLD, HTN, OSA on CPAP.    PT Comments   Pt admitted with above diagnosis.  Pt currently with functional limitations due to the deficits listed below (see PT Problem List). Pt seated EOB when PT arrived. Pt agreeable to therapy intervention, pt indicated that he thought he would be gone by now at at the SNF for rehab. Pt awaiting results of ABI, however due to mm spasm and LE movement possibly inconclusive. Pt able to progress with standing trials from elevated EOB with min A and nursing staff present to 41s with CGA to maintain standing with B UE support. Pt left seated EOB and all needs in place, PT encouraged pt to request pain medication and importance of pain management with  progression of therapy. Pt demonstrated verbal understanding. PT continues to recommend continued inpatient follow up therapy, <3 hours/day. Pt will benefit from acute skilled PT to increase their independence and safety with mobility to allow discharge.      If plan is discharge home, recommend the following: Two people to help with walking and/or transfers;A lot of help with bathing/dressing/bathroom;Assistance with cooking/housework;Assist for transportation   Can travel by private vehicle        Equipment Recommendations  None recommended by PT (pt reports DME in home setting)    Recommendations for Other Services       Precautions / Restrictions Precautions Precautions: Fall Required Braces or Orthoses: Other Brace Other Brace: R CAM boot Restrictions Weight Bearing Restrictions: No RLE Weight Bearing: Weight bearing as tolerated Other Position/Activity Restrictions: RLE WBAT with CAM boot donned, R ankle ROM as tolerated     Mobility  Bed Mobility Overal bed mobility: Modified Independent             General bed mobility comments: patient was sitting EOB upon arrival    Transfers Overall transfer level: Needs assistance Equipment used: Rolling walker (2 wheels) (Steady) Transfers: Sit to/from Stand Sit to Stand: Min assist, +2 safety/equipment, From elevated surface           General transfer comment: R CAM boot donned when PT arrived, PT assisted with L standard shoe, pt indicated difficulty this am with trials with standing weight. PT assisted pt with sit to stand with  use of Steady x 3 with min A and pt progressing to static standing wtih CGA on lift for 33s, nurse arrived and indicated that pt has been able to perform SPT bed <> BSC over the weekend, standing trial to RW with min A and cues with pt maintaining static standing with CGA for 41s  PT able to collaborate with nursing staff to obtain standing weight on scale with min A and cues. pt indicates R LE  pain limiting standing tolerance with instabiltiy noted    Ambulation/Gait               General Gait Details: NT pt was requriing increased physical assist and not safe for pt or therapist to trail gait tasks   Stairs             Wheelchair Mobility     Tilt Bed    Modified Rankin (Stroke Patients Only)       Balance Overall balance assessment: History of Falls, Needs assistance Sitting-balance support: Feet supported Sitting balance-Leahy Scale: Good     Standing balance support: Bilateral upper extremity supported, During functional activity, Reliant on assistive device for balance Standing balance-Leahy Scale: Poor Standing balance comment: improved static standing extension posture and increased IND with CGA                            Cognition Arousal/Alertness: Awake/alert Behavior During Therapy: WFL for tasks assessed/performed Overall Cognitive Status: Within Functional Limits for tasks assessed                                          Exercises      General Comments        Pertinent Vitals/Pain Pain Assessment Pain Assessment: 0-10 Pain Score: 8  Pain Location: B LEs, R knee > R ankle Pain Descriptors / Indicators: Cramping, Constant, Discomfort Pain Intervention(s): Limited activity within patient's tolerance, Monitored during session, Repositioned, Patient requesting pain meds-RN notified    Home Living Family/patient expects to be discharged to:: Private residence Living Arrangements: Spouse/significant other Available Help at Discharge: Family;Available 24 hours/day Type of Home: House Home Access: Ramped entrance       Home Layout: One level Home Equipment: Agricultural consultant (2 wheels);BSC/3in1;Wheelchair - Careers adviser (comment) (lift chair)      Prior Function            PT Goals (current goals can now be found in the care plan section) Acute Rehab PT Goals 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 PT Goal Formulation: With patient Time For Goal Achievement: 01/16/23 Potential to Achieve Goals: Good Progress towards PT goals: Progressing toward goals    Frequency    Min 1X/week      PT Plan Current plan remains appropriate    Co-evaluation PT/OT/SLP Co-Evaluation/Treatment: Yes            AM-PAC PT "6 Clicks" Mobility   Outcome Measure  Help needed turning from your back to your side while in a flat bed without using bedrails?: None Help needed moving from lying on your back to sitting on the side of a flat bed without using bedrails?: None Help needed moving to and from a bed to a chair (including a wheelchair)?: A Lot Help needed standing up from a chair using your arms (e.g., wheelchair  or bedside chair)?: A Lot Help needed to walk in hospital room?: Total Help needed climbing 3-5 steps with a railing? : Total 6 Click Score: 14    End of Session Equipment Utilized During Treatment: Gait belt (steady lift) Activity Tolerance: Patient limited by fatigue;No increased pain Patient left: in bed;with call bell/phone within reach (seated EOB) Nurse Communication: Mobility status;Patient requests pain meds (steady to get OOB at this time) PT Visit Diagnosis: Unsteadiness on feet (R26.81);Other abnormalities of gait and mobility (R26.89);Repeated falls (R29.6);Difficulty in walking, not elsewhere classified (R26.2);Pain Pain - Right/Left:  (B) Pain - part of body: Knee;Leg     Time: 1191-4782 PT Time Calculation (min) (ACUTE ONLY): 23 min  Charges:    $Therapeutic Activity: 23-37 mins PT General Charges $$ ACUTE PT VISIT: 1 Visit                    Johnny Bridge, PT Acute Rehab   Jacqualyn Posey 01/05/2023, 5:07 PM

## 2023-01-05 NOTE — Progress Notes (Signed)
Nutrition Note  RD consulted for nutrition education. Pt requested. Would like weight loss.  Lab Results  Component Value Date   HGBA1C 6.3 (H) 12/25/2022  Good control with history of diabetes.  RD provided "Low Sodium Nutrition Therapy" and  "Carbohydrate Counting for People with Diabetes", "Plate Method" handout from the Academy of [employer] and Dietetics. Discussed different food groups and their effects on blood sugar, emphasizing carbohydrate-containing foods. Provided list of carbohydrates and recommended serving sizes of common foods.  Discussed importance of controlled and consistent carbohydrate intake throughout the day. Provided examples of ways to balance meals/snacks and encouraged intake of high-fiber, whole grain complex carbohydrates. Teach back method used.  Expect good compliance. Pt seems very motivated to make any necessary dietary changes as well as stop smoking. Pt with fluid accumulation so discussed low sodium diet. Pt reports he would like to improve his mobility with weight loss and would like to eat enough to still support healing and recovery from his right fibular fracture.  Pt open to outpatient diet education as well. Will place referral. NT came and checked blood sugar following lunch, CBG ~131    Body mass index is 47.14 kg/m. Pt meets criteria for morbid obesity based on current BMI.  Current diet order is CHO modified, patient is consuming approximately 95% of meals at this time. Labs and medications reviewed. No further nutrition interventions warranted at this time. If additional nutrition issues arise, please re-consult RD.  Tilda Franco, MS, RD, LDN Inpatient Clinical Dietitian Contact information available via Amion

## 2023-01-06 DIAGNOSIS — R601 Generalized edema: Secondary | ICD-10-CM | POA: Diagnosis not present

## 2023-01-06 DIAGNOSIS — R627 Adult failure to thrive: Secondary | ICD-10-CM | POA: Diagnosis not present

## 2023-01-06 LAB — GLUCOSE, CAPILLARY
Glucose-Capillary: 169 mg/dL — ABNORMAL HIGH (ref 70–99)
Glucose-Capillary: 179 mg/dL — ABNORMAL HIGH (ref 70–99)
Glucose-Capillary: 120 mg/dL — ABNORMAL HIGH (ref 70–99)
Glucose-Capillary: 232 mg/dL — ABNORMAL HIGH (ref 70–99)
Glucose-Capillary: 216 mg/dL — ABNORMAL HIGH (ref 70–99)

## 2023-01-06 MED ORDER — HYDRALAZINE HCL 25 MG PO TABS
25.0000 mg | ORAL_TABLET | Freq: Three times a day (TID) | ORAL | Status: DC
  Administered 2023-01-06 – 2023-01-10 (×13): 25 mg via ORAL
  Filled 2023-01-06 (×13): qty 1

## 2023-01-06 MED ORDER — BUMETANIDE 1 MG PO TABS
4.0000 mg | ORAL_TABLET | Freq: Two times a day (BID) | ORAL | Status: DC
  Administered 2023-01-07 – 2023-01-12 (×11): 4 mg via ORAL
  Filled 2023-01-06 (×11): qty 4

## 2023-01-06 NOTE — Progress Notes (Signed)
   01/06/23 0555  Assess: MEWS Score  Temp 98.3 F (36.8 C)  BP (!) 202/89 (NP notified of BP. waiting on orders)  MAP (mmHg) 114  Pulse Rate 79  Resp 15  SpO2 97 %  Assess: MEWS Score  MEWS Temp 0  MEWS Systolic 2  MEWS Pulse 0  MEWS RR 0  MEWS LOC 0  MEWS Score 2  MEWS Score Color Yellow  Assess: if the MEWS score is Yellow or Red  Were vital signs accurate and taken at a resting state? Yes  Does the patient meet 2 or more of the SIRS criteria? No  MEWS guidelines implemented  Yes, yellow  Treat  MEWS Interventions Considered administering scheduled or prn medications/treatments as ordered  Take Vital Signs  Increase Vital Sign Frequency  Yellow: Q2hr x1, continue Q4hrs until patient remains green for 12hrs  Escalate  MEWS: Escalate Yellow: Discuss with charge nurse and consider notifying provider and/or RRT  Notify: Charge Nurse/RN  Name of Charge Nurse/RN Notified Renita, RN  Provider Notification  Provider Name/Title Garner Nash  Date Provider Notified 01/06/23  Time Provider Notified 0600  Method of Notification Page  Notification Reason Other (Comment) (BP elevated causing yellow mews)  Provider response Other (Comment) (said to give hydralazine)  Date of Provider Response 01/06/23  Time of Provider Response 0600  Notify: Rapid Response  Name of Rapid Response RN Notified n/a; will notify if BP does not improve after interventions.  Assess: SIRS CRITERIA  SIRS Temperature  0  SIRS Pulse 0  SIRS Respirations  0  SIRS WBC 0  SIRS Score Sum  0

## 2023-01-06 NOTE — Progress Notes (Signed)
   01/06/23 2102  BiPAP/CPAP/SIPAP  BiPAP/CPAP/SIPAP Pt Type Adult  BiPAP/CPAP/SIPAP DREAMSTATIOND  Mask Type Full face mask  Mask Size Large  FiO2 (%) 21 %  Patient Home Equipment No  Auto Titrate Yes  CPAP/SIPAP surface wiped down Yes   Pt. able to place on Independently, aware to notify if help needed, remains on R/A.

## 2023-01-06 NOTE — Progress Notes (Signed)
PROGRESS NOTE    Raymond [name]  WUJ:[id number] DOB: [birth year] DOA: 12/24/2022 PCP: Venetia Constable, MD   Brief Narrative: Raymond [name] is a 57 y.o. male with a history of obesity, hypertension, CKD, tobacco use, diabetes mellitus.  Patient presented secondary to poor functional status and worsening lower extremity edema.  Patient seen by orthopedic surgery with recommendation for conservative management of right fibril fracture.  Patient underwent diuresis for lower extremity edema.  Patient evaluated by physical and Occupational Therapy with recommendation for SNF on discharge.   Assessment and Plan:  Displaced right fibular fracture Slightly displaced fibular fracture noted on imaging in setting of recent fall.  Orthopedic surgery was consulted with recommendation for conservative management.  Per orthopedic surgery, patient is weightbearing as tolerated to right lower extremity in cam boot and is nonweightbearing to right lower extremity out of cam boot.  Anasarca Significant lower extremity edema.  Lower extremity venous duplex negative for DVT.  Transthoracic echocardiogram significant for normal LVEF with normal RV function.  Patient started on IV Lasix for diuresis with good response.  Creatinine appears to be slightly increasing. ABIs normal, complicated by movement during exam. -Continue Lasix IV today -Daily BMP while on Lasix IV -TED hose  Diabetes mellitus type 2 -Continue SSI  Tobacco use Patient received cessation counseling this admission. -Continue nicotine patch  Hypokalemia Likely related to IV diuresis.  Potassium repleted as needed.  Hypertension Uncontrolled. Asymptomatic.  -Continue losartan, amlodipine, doxazosin -Add hydralazine scheduled -Continue hydralazine PRN  CKD stage IIIa Baseline creatinine appears to be around 1.5.  Right lower extremity numbness Per patient and his wife, patient started having symptoms after a fall involving a fall  on his back.  No associated back pain.  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  Right leg is hyperreflexic. MRI without critical pathology. Patient should follow-up outpatient with orthopedic surgery.  Vitamin D deficiency -Continue vitamin D supplementation  Constipation -Continue MiraLAX, Senokot, Colace -Lactulose PRN  Morbid obesity Estimated body mass index is 47.14 kg/m as calculated from the following:   Height as of this encounter: 5\' 11"  (1.803 m).   Weight as of this encounter: 153.3 kg. -Dietitian consulted per patient request; outpatient dietitian follow-up   DVT prophylaxis: Lovenox Code Status:   Code Status: Full Code Family Communication: Wife at bedside. Disposition Plan: Discharge to SNF once bed is available. Can transition to oral Lasix on discharge.   Consultants:  Orthopedic surgery  Procedures:  7/25: Transthoracic echocardiogram  Antimicrobials: None   Subjective: No issues overnight. Finding it easier to move.  Objective: BP (!) 202/89 (BP Location: Left Arm) Comment: NP notified of BP. waiting on orders  Pulse 79   Temp 98.3 F (36.8 C) (Oral)   Resp 15   Ht 5\' 11"  (1.803 m)   Wt (!) 153.3 kg   SpO2 97%   BMI 47.14 kg/m   Examination:  General exam: Appears calm and comfortable Respiratory system: Respiratory effort normal. Central nervous system: Alert and oriented. Musculoskeletal: 2+ RLE pitting edema. Right leg in CAM boot Psychiatry: Judgement and insight appear normal. Mood & affect appropriate.    Data Reviewed: I have personally reviewed following labs and imaging studies  CBC Lab Results  Component Value Date   WBC 9.5 12/31/2022   RBC 3.22 (L) 12/31/2022   HGB 9.9 (L) 12/31/2022   HCT 30.7 (  L) 12/31/2022   MCV 95.3 12/31/2022   MCH 30.7 12/31/2022   PLT 305 12/31/2022   MCHC 32.2  12/31/2022   RDW 16.0 (H) 12/31/2022   LYMPHSABS 1.7 12/24/2022   MONOABS 0.9 12/24/2022   EOSABS 0.5 12/24/2022   BASOSABS 0.1 12/24/2022     Last metabolic panel Lab Results  Component Value Date   NA 136 01/06/2023   K 3.8 01/06/2023   CL 96 (L) 01/06/2023   CO2 28 01/06/2023   BUN 26 (H) 01/06/2023   CREATININE 1.55 (H) 01/06/2023   GLUCOSE 160 (H) 01/06/2023   GFRNONAA 51 (L) 01/06/2023   GFRAA >90 07/19/2014   CALCIUM 9.4 01/06/2023   PROT 7.2 12/31/2022   ALBUMIN 3.3 (L) 12/31/2022   BILITOT 0.7 12/31/2022   ALKPHOS 81 12/31/2022   AST 20 12/31/2022   ALT 28 12/31/2022   ANIONGAP 12 01/06/2023    GFR: Estimated Creatinine Clearance: 77.3 mL/min (A) (by C-G formula based on SCr of 1.55 mg/dL (H)).  No results found for this or any previous visit (from the past 240 hour(s)).    Radiology Studies: VAS [place] ABI WITH/WO TBI  Result Date: 01/05/2023  LOWER EXTREMITY DOPPLER STUDY Patient Name:  Raymond [name]  Date of Exam:   01/05/2023 Medical Rec #: [id number]    Accession #:    [id number] Date of Birth: 06/11/1965     Patient Gender: M Patient Age:   76 years Exam Location:  [facility] Procedure:      VAS [place] ABI WITH/WO TBI Referring Phys:   --------------------------------------------------------------------------------  Indications: Decreased pedal pulses High Risk Factors: Hypertension, hyperlipidemia, Diabetes, current smoker.  Limitations: Today's exam was limited due to Constant patient movement, leg              spasms and patient positioning. Comparison Study: No prior studies. Performing Technologist: Olen Cordial RVT  Examination Guidelines: A complete evaluation includes at minimum, Doppler waveform signals and systolic blood pressure reading at the level of bilateral brachial, anterior tibial, and posterior tibial arteries, when vessel segments are accessible. Bilateral testing is considered an integral part of a complete examination. Photoelectric  Plethysmograph (PPG) waveforms and toe systolic pressure readings are included as required and additional duplex testing as needed. Limited examinations for reoccurring indications Cortina be performed as noted.  ABI Findings: +---------+------------------+-----+---------+--------+ Right    Rt Pressure (mmHg)IndexWaveform Comment  +---------+------------------+-----+---------+--------+ Brachial 185                    triphasic         +---------+------------------+-----+---------+--------+ PTA      193               1.04 triphasic         +---------+------------------+-----+---------+--------+ DP       164               0.89 triphasic         +---------+------------------+-----+---------+--------+ Great Toe153               0.83                   +---------+------------------+-----+---------+--------+ +--------+------------------+-----+---------+-------+ Left    Lt Pressure (mmHg)IndexWaveform Comment +--------+------------------+-----+---------+-------+ [id number]                    triphasic        +--------+------------------+-----+---------+-------+ PTA     191  1.03 triphasic        +--------+------------------+-----+---------+-------+ DP      186               1.01 triphasic        +--------+------------------+-----+---------+-------+ +-------+-----------+-----------+------------+------------+ ABI/TBIToday's ABIToday's TBIPrevious ABIPrevious TBI +-------+-----------+-----------+------------+------------+ Right  1.04       0.83                                +-------+-----------+-----------+------------+------------+ Left   1.03                                           +-------+-----------+-----------+------------+------------+   Summary: Right: Resting right ankle-brachial index is within normal range. The right toe-brachial index is normal. ABI's are likely inaccurate due to patient movement/leg spasms. Left: Resting left  ankle-brachial index is within normal range. Unable to obtain TBI due to patient movement/leg spasms. ABI's are likely inaccurate due to patient movement/leg spasms. *See table(s) above for measurements and observations.  Electronically signed by Gerarda Fraction on 01/05/2023 at 7:45:50 PM.    Final       LOS: 13 days    Jacquelin Hawking, MD [facility] 01/06/2023, 11:37 AM   If 7PM-7AM, please contact night-coverage [email or web address]

## 2023-01-06 NOTE — Progress Notes (Signed)
Occupational Therapy Treatment Patient Details Name: Raymond [name] MRN: [id number] DOB: 08-Sep-1965 Today's Date: 01/06/2023   History of present illness 57 yo male presents to therapy following hospital admission on 12/24/2022 due to progressive R LE pain and B LE weakness and edema impacting functional mobility. Pt sustained several recent falls and once of which resulted in R lateral malleolus fx 12/06/2022 pt presented to ED and d/c home with brace. Pt had follow up OP with orthopedic 7/14 and continued recommendation for conservative management and R LE WBAT with CAM boot donned for 6-8 weeks. Pt subsequently fell again on 7/21 resulting in increased LE pain and edema and presented to ED no acute fx and d/c home. Pt returned to ED on 7/24 indicating he has been bed bound since 7/21 due to inability to WB through R LE with worsening B knee pain R > L.and new onset of LE weakness. Pt R lateral malleolar fx is now slightly displaced distal fibular fx and orthopedics consulted with ongoing recommendation for conservative management. DVT negative. Pt also found to have anasarca with B LE edema and suspect for cellulitis with wound weeping. CT of head and spine negative for acute findings. Pt has PMH of DM II, tobacco abuse, COPD, gout, HLD, HTN, OSA on CPAP.   OT comments  Patient was able to make progress towards goals with patient able to stand and participate in transfer with RW and +2 for safety. Patient was educated on strategies for anxiousness about BLE and how to slowly build confidence in self. Patient will benefit from continued inpatient follow up therapy, <3 hours/day    Recommendations for follow up therapy are one component of a multi-disciplinary discharge planning process, led by the attending physician.  Recommendations [place] be updated based on patient status, additional functional criteria and insurance authorization.    Assistance Recommended at Discharge Frequent or constant  Supervision/Assistance  Patient can return home with the following  Two people to help with walking and/or transfers;Two people to help with bathing/dressing/bathroom;Assist for transportation;Help with stairs or ramp for entrance   Equipment Recommendations  None recommended by OT    Recommendations for Other Services      Precautions / Restrictions Precautions Precautions: Fall Required Braces or Orthoses: Other Brace Other Brace: R CAM boot Restrictions Weight Bearing Restrictions: No RLE Weight Bearing: Weight bearing as tolerated Other Position/Activity Restrictions: RLE WBAT with CAM boot donned, R ankle ROM as tolerated          Balance Overall balance assessment: History of Falls, Needs assistance Sitting-balance support: Feet supported Sitting balance-Leahy Scale: Good     Standing balance support: Bilateral upper extremity supported, During functional activity, Reliant on assistive device for balance Standing balance-Leahy Scale: Poor           ADL either performed or assessed with clinical judgement   ADL Overall ADL's : Needs assistance/impaired       General ADL Comments: patient was able to transfer in standing to and from reclienr in room with +2 min A with education on breathing and verbalizing statements of how he is feeling. during transfer to recliner patient reported that "im going down" with no evidence of falling. patient noted to be anxious and untrusting of BLE with h/o bucking. patient was educated on slowing down breathing and taking it one step at at a time while communicating to therapist what was occuring not what he was fearful of happening in the moment to better be able to support patient.  patient veralized understanding. patient was able to take few steps forwards then turn to bed with increased time with noted buckling of BLE upon arrival of BLE at EOB>      Cognition Arousal/Alertness: Awake/alert Behavior During Therapy: WFL for tasks  assessed/performed Overall Cognitive Status: Within Functional Limits for tasks assessed                         Pertinent Vitals/ Pain       Pain Assessment Pain Assessment: 0-10 Pain Score: 4  Pain Location: B LEs, R knee > R ankle Pain Descriptors / Indicators: Cramping, Constant, Discomfort Pain Intervention(s): Limited activity within patient's tolerance, Monitored during session, Repositioned  Home Living Family/patient expects to be discharged to:: Private residence Living Arrangements: Spouse/significant other Available Help at Discharge: Family;Available 24 hours/day Type of Home: House Home Access: Ramped entrance     Home Layout: One level     Bathroom Shower/Tub: Chief Strategy Officer: Standard Bathroom Accessibility: Yes   Home Equipment: Agricultural consultant (2 wheels);BSC/3in1;Wheelchair - Careers adviser (comment) (lift chair)              Frequency  Min 1X/week        Progress Toward Goals  OT Goals(current goals can now be found in the care plan section)  Progress towards OT goals: Progressing toward goals     Plan Discharge plan remains appropriate    Co-evaluation    PT/OT/SLP Co-Evaluation/Treatment: Yes Reason for Co-Treatment: Complexity of the patient's impairments (multi-system involvement);For patient/therapist safety;To address functional/ADL transfers PT goals addressed during session: Mobility/safety with mobility;Balance;Proper use of DME;Strengthening/ROM OT goals addressed during session: ADL's and self-care;Proper use of Adaptive equipment and DME      AM-PAC OT "6 Clicks" Daily Activity     Outcome Measure   Help from another person eating meals?: None Help from another person taking care of personal grooming?: A Little Help from another person toileting, which includes using toliet, bedpan, or urinal?: A Lot Help from another person bathing (including washing, rinsing, drying)?: A Lot Help from another  person to put on and taking off regular upper body clothing?: A Little Help from another person to put on and taking off regular lower body clothing?: Total 6 Click Score: 15    End of Session Equipment Utilized During Treatment: Rolling walker (2 wheels);Gait belt  OT Visit Diagnosis: Unsteadiness on feet (R26.81);Other abnormalities of gait and mobility (R26.89);Muscle weakness (generalized) (M62.81)   Activity Tolerance Patient tolerated treatment well   Patient Left with call bell/phone within reach;in bed   Nurse Communication Mobility status        Time: 8315-1761 OT Time Calculation (min): 26 min  Charges: OT General Charges $OT Visit: 1 Visit OT Treatments $Therapeutic Activity: 8-22 mins  Rosalio Loud, MS Acute Rehabilitation Department Office# [phone]   Selinda Flavin 01/06/2023, 4:14 PM

## 2023-01-06 NOTE — Progress Notes (Signed)
Physical Therapy Treatment Patient Details Name: Raymond [name] MRN: [id number] DOB: 1965/09/23 Today's Date: 01/06/2023   History of Present Illness 57 yo male presents to therapy following hospital admission on 12/24/2022 due to progressive R LE pain and B LE weakness and edema impacting functional mobility. Pt sustained several recent falls and once of which resulted in R lateral malleolus fx 12/06/2022 pt presented to ED and d/c home with brace. Pt had follow up OP with orthopedic 7/14 and continued recommendation for conservative management and R LE WBAT with CAM boot donned for 6-8 weeks. Pt subsequently fell again on 7/21 resulting in increased LE pain and edema and presented to ED no acute fx and d/c home. Pt returned to ED on 7/24 indicating he has been bed bound since 7/21 due to inability to WB through R LE with worsening B knee pain R > L.and new onset of LE weakness. Pt R lateral malleolar fx is now slightly displaced distal fibular fx and orthopedics consulted with ongoing recommendation for conservative management. DVT negative. Pt also found to have anasarca with B LE edema and suspect for cellulitis with wound weeping. CT of head and spine negative for acute findings. Pt has PMH of DM II, tobacco abuse, COPD, gout, HLD, HTN, OSA on CPAP.    PT Comments   Pt admitted with above diagnosis.  Pt currently with functional limitations due to the deficits listed below (see PT Problem List). Pt seated EOB when PT arrived. Pt agreeable to therapy intervention. Focus on progression with SPT at RW level with min A x 2 for safety, pt able to demonstrate ability to amb 2 feet ~ 4 steps with RW min A x 2 and recliner close. Ongoing instability noted R LE impacting gait tolerance and safety. Pt elected to sit EOB and all needs in place with anticipation of transitioning to SNF for continued inpatient follow up therapy, <3 hours/day. Pt will benefit from acute skilled PT to increase their independence and safety  with mobility to allow discharge.      If plan is discharge home, recommend the following: Two people to help with walking and/or transfers;A lot of help with bathing/dressing/bathroom;Assistance with cooking/housework;Assist for transportation   Can travel by private vehicle        Equipment Recommendations  None recommended by PT (pt reports DME in home setting)    Recommendations for Other Services       Precautions / Restrictions Precautions Precautions: Fall Required Braces or Orthoses: Other Brace Other Brace: R CAM boot Restrictions Weight Bearing Restrictions: No RLE Weight Bearing: Weight bearing as tolerated (with boot only.) Other Position/Activity Restrictions: RLE WBAT with CAM boot donned, R ankle ROM as tolerated     Mobility  Bed Mobility Overal bed mobility: Modified Independent             General bed mobility comments: patient was sitting EOB upon arrival    Transfers Overall transfer level: Needs assistance Equipment used: Rolling walker (2 wheels) (Steady) Transfers: Sit to/from Stand, Bed to chair/wheelchair/BSC Sit to Stand: +2 safety/equipment, From elevated surface, Mod assist           General transfer comment: R CAM boot donned when PT arrived and L standard shoe donned. trail with STS from elevated EOB to RW pull to stand with min A x 1 and mod A x 1 pt indicated feeling like his R leg was going to buckle, pt returned to EOB, subsequent standing trial min A x 2  for sit to stand and pt able to progress with SPT at RW level to recliner, mod A x 2 for sit to stand from recliner to RW and pt able to progress with 4 steps toward HOB, pivot and sit EOB with min A x 2 and recliner close, multimodal cues for encouragement and technique. pt continues to demonstrate defficits with eccentric control to sitting surface and proper hand placement    Ambulation/Gait Ambulation/Gait assistance: Min assist, +2 physical assistance, +2  safety/equipment Gait Distance (Feet): 2 Feet Assistive device: Rolling walker (2 wheels) Gait Pattern/deviations: Step-to pattern, Knees buckling, Trunk flexed       General Gait Details: pt able to progress with 4 steps today with RW and cues, noted R LE instabiltiy with CAM boot donned   Stairs             Wheelchair Mobility     Tilt Bed    Modified Rankin (Stroke Patients Only)       Balance Overall balance assessment: History of Falls, Needs assistance Sitting-balance support: Feet supported Sitting balance-Leahy Scale: Good     Standing balance support: Bilateral upper extremity supported, During functional activity, Reliant on assistive device for balance Standing balance-Leahy Scale: Poor Standing balance comment: improved static standing extension posture and increased IND with CGA                            Cognition Arousal/Alertness: Awake/alert Behavior During Therapy: WFL for tasks assessed/performed Overall Cognitive Status: Within Functional Limits for tasks assessed                                          Exercises      General Comments        Pertinent Vitals/Pain Pain Assessment Pain Assessment: 0-10 Pain Score: 4  Pain Location: B LEs, R knee > R ankle Pain Descriptors / Indicators: Cramping, Constant, Discomfort Pain Intervention(s): Limited activity within patient's tolerance, Monitored during session, Repositioned    Home Living Family/patient expects to be discharged to:: Private residence Living Arrangements: Spouse/significant other Available Help at Discharge: Family;Available 24 hours/day Type of Home: House Home Access: Ramped entrance       Home Layout: One level Home Equipment: Agricultural consultant (2 wheels);BSC/3in1;Wheelchair - Careers adviser (comment) (lift chair)      Prior Function            PT Goals (current goals can now be found in the care plan section) Acute Rehab PT  Goals 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 PT Goal Formulation: With patient Time For Goal Achievement: 01/16/23 Potential to Achieve Goals: Good    Frequency    Min 1X/week      PT Plan Current plan remains appropriate    Co-evaluation PT/OT/SLP Co-Evaluation/Treatment: Yes Reason for Co-Treatment: Complexity of the patient's impairments (multi-system involvement);For patient/therapist safety;To address functional/ADL transfers PT goals addressed during session: Mobility/safety with mobility;Balance;Proper use of DME;Strengthening/ROM OT goals addressed during session: ADL's and self-care;Proper use of Adaptive equipment and DME      AM-PAC PT "6 Clicks" Mobility   Outcome Measure  Help needed turning from your back to your side while in a flat bed without using bedrails?: None Help needed moving from lying on your back to sitting on the side of a flat bed without using bedrails?:  None Help needed moving to and from a bed to a chair (including a wheelchair)?: A Lot Help needed standing up from a chair using your arms (e.g., wheelchair or bedside chair)?: A Lot Help needed to walk in hospital room?: Total Help needed climbing 3-5 steps with a railing? : Total 6 Click Score: 14    End of Session Equipment Utilized During Treatment: Gait belt Activity Tolerance: Patient limited by fatigue;No increased pain Patient left: in bed;with call bell/phone within reach (seated EOB) Nurse Communication: Mobility status PT Visit Diagnosis: Unsteadiness on feet (R26.81);Other abnormalities of gait and mobility (R26.89);Repeated falls (R29.6);Difficulty in walking, not elsewhere classified (R26.2);Pain Pain - Right/Left:  (B) Pain - part of body: Knee;Leg     Time: 7062-3762 PT Time Calculation (min) (ACUTE ONLY): 24 min  Charges:    $Therapeutic Activity: 8-22 mins PT General Charges $$ ACUTE PT VISIT: 1 Visit                     Johnny Bridge, PT Acute  Rehab    Jacqualyn Posey 01/06/2023, 4:09 PM

## 2023-01-06 NOTE — Progress Notes (Signed)
Mobility Specialist - Progress Note   01/06/23 1044  Mobility  Activity Transferred to/from BSC;Stood at bedside  Level of Assistance +2 (takes two people)  Location manager Ambulated (ft) 2 ft  Range of Motion/Exercises Active Assistive  Activity Response Tolerated well  Mobility Referral Yes  $Mobility charge 1 Mobility  Mobility Specialist Start Time (ACUTE ONLY) 1015  Mobility Specialist Stop Time (ACUTE ONLY) 1044  Mobility Specialist Time Calculation (min) (ACUTE ONLY) 29 min   Pt was found sitting EOB wanting to get to [facility]. NT in room to assist with session. Pt stood and pivoted to [facility] able to follow cues. Afterwards stood and pivoted to bedside and was able to stand for ~2.5 min. Attempte transfer to recliner chair but unsuccessful due to fatigue and L knee buckling. At EOS was left sitting EOB with all needs met. Call bell in reach.  Billey Chang Mobility Specialist

## 2023-01-07 DIAGNOSIS — R627 Adult failure to thrive: Secondary | ICD-10-CM | POA: Diagnosis not present

## 2023-01-07 LAB — GLUCOSE, CAPILLARY
Glucose-Capillary: 169 mg/dL — ABNORMAL HIGH (ref 70–99)
Glucose-Capillary: 132 mg/dL — ABNORMAL HIGH (ref 70–99)
Glucose-Capillary: 139 mg/dL — ABNORMAL HIGH (ref 70–99)
Glucose-Capillary: 160 mg/dL — ABNORMAL HIGH (ref 70–99)

## 2023-01-07 NOTE — TOC Progression Note (Signed)
Transition of Care [facility]) - Progression Note    Patient Details  Name: Raymond [name] MRN: [id number] Date of Birth: 01/16/1966  [facility]) CM/SW Contact  Beckie Busing, RN Phone Number:[phone]  01/07/2023, 11:41 AM  Clinical Narrative:    Patient will discharge to [facility]. CM called Raymond [name] with admissions at [facility] to get update on insurance auth. Per Colin Mulders  the case has been assigned for nurse to review but no determination yet. Will continue to follow for insurance auth.    Expected Discharge Plan: Skilled Nursing Facility Barriers to Discharge: Continued Medical Work up  Expected Discharge Plan and Services                                               Social Determinants of Health (SDOH) Interventions SDOH Screenings   Food Insecurity: No Food Insecurity (12/24/2022)  Housing: Low Risk  (12/24/2022)  Transportation Needs: No Transportation Needs (12/24/2022)  Utilities: Not At Risk (12/24/2022)  Social Connections: Unknown (01/10/2022)   Received from [facility], [facility]  Stress: No Stress Concern Present (03/21/2022)   Received from [facility], [facility]  Tobacco Use: High Risk (12/24/2022)    Readmission Risk Interventions    12/25/2022    4:24 PM  Readmission Risk Prevention Plan  Post Dischage Appt Complete  Medication Screening Complete  Transportation Screening Complete

## 2023-01-07 NOTE — Progress Notes (Signed)
PROGRESS NOTE    Raymond [name]  LKG:[id number] DOB: 1966/02/28 DOA: 12/24/2022 PCP: Venetia Constable, MD   Brief Narrative: Raymond [name] is a 57 y.o. male with a history of obesity, hypertension, CKD, tobacco use, diabetes mellitus. Patient presented secondary to poor functional status and worsening lower extremity edema. Patient seen by orthopedic surgery with recommendation for conservative management of right fibular fracture. Patient underwent diuresis for lower extremity edema. Patient evaluated by PT/OT with recommendation for SNF on discharge   Assessment and Plan:  Anasarca Significant lower extremity edema Lower extremity venous duplex negative for DVT ABIs normal, complicated by movement during exam ECHO with normal LVEF with normal RV function S/p IV Lasix with good diuresis, switched to home bumex, continue TED hose Daily BMP  Displaced right fibular fracture Slightly displaced fibular fracture noted on imaging in setting of recent fall Orthopedic surgery was consulted with recommendation for conservative management, weightbearing as tolerated in cam boot and is nonweightbearing to right lower extremity out of cam boot.  Diabetes mellitus type 2 Continue SSI, accuchecks, hypoglycemic protocol  Tobacco use Patient received cessation counseling this admission. Continue nicotine patch  Hypokalemia Likely related to IV diuresis.  Potassium repleted as needed.  Hypertension Uncontrolled Continue losartan, amlodipine, doxazosin, added PO hydralazine Continue IV hydralazine PRN  CKD stage IIIa Baseline creatinine appears to be around 1.5  Right lower extremity numbness Per patient and his wife, patient started having symptoms after a fall involving a fall on his back No associated back pain CT scan of lumbar spine significant for foraminal stenosis greatest at L4-5 with right greater than left in addition to moderate foraminal stenosis bilaterally at L3-4.   Right leg is hyperreflexic MRI without critical pathology Patient should follow-up outpatient with orthopedic surgery  Vitamin D deficiency Continue vitamin D supplementation  Constipation Continue MiraLAX, Senokot, Colace Lactulose PRN  Morbid obesity Estimated body mass index is 48.12 kg/m as calculated from the following:   Height as of this encounter: 5\' 11"  (1.803 m).   Weight as of this encounter: 156.5 kg. Dietitian consulted per patient request; outpatient dietitian follow-up Lifestyle modification advised     DVT prophylaxis: Lovenox Code Status:   Code Status: Full Code Family Communication: Wife at bedside. Disposition Plan: Discharge to SNF once bed is available   Consultants:  Orthopedic surgery  Procedures:  None  Antimicrobials: None   Subjective: Denies any new complaints, just reporting persistent numbness.     Objective: BP (!) 175/87 (BP Location: Left Arm)   Pulse 98   Temp 98 F (36.7 C) (Oral)   Resp 18   Ht 5\' 11"  (1.803 m)   Wt (!) 156.5 kg   SpO2 95%   BMI 48.12 kg/m    Examination: General: NAD  Cardiovascular: S1, S2 present Respiratory: CTAB Abdomen: Soft, nontender, nondistended, bowel sounds present Musculoskeletal: 2+ bilateral pedal edema noted, RLE in CAM boot Skin: Normal Psychiatry: Normal mood     Data Reviewed: I have personally reviewed following labs and imaging studies  CBC Lab Results  Component Value Date   WBC 9.5 12/31/2022   RBC 3.22 (L) 12/31/2022   HGB 9.9 (L) 12/31/2022   HCT 30.7 (L) 12/31/2022   MCV 95.3 12/31/2022   MCH 30.7 12/31/2022   PLT 305 12/31/2022   MCHC 32.2 12/31/2022   RDW 16.0 (H) 12/31/2022   LYMPHSABS 1.7 12/24/2022   MONOABS 0.9 12/24/2022   EOSABS 0.5 12/24/2022   BASOSABS 0.1 12/24/2022  Last metabolic panel Lab Results  Component Value Date   NA 132 (L) 01/07/2023   K 3.8 01/07/2023   CL 97 (L) 01/07/2023   CO2 25 01/07/2023   BUN 30 (H) 01/07/2023    CREATININE 1.53 (H) 01/07/2023   GLUCOSE 196 (H) 01/07/2023   GFRNONAA 52 (L) 01/07/2023   GFRAA >90 07/19/2014   CALCIUM 8.7 (L) 01/07/2023   PROT 7.2 12/31/2022   ALBUMIN 3.3 (L) 12/31/2022   BILITOT 0.7 12/31/2022   ALKPHOS 81 12/31/2022   AST 20 12/31/2022   ALT 28 12/31/2022   ANIONGAP 10 01/07/2023    GFR: Estimated Creatinine Clearance: 79.3 mL/min (A) (by C-G formula based on SCr of 1.53 mg/dL (H)).  No results found for this or any previous visit (from the past 240 hour(s)).    Radiology Studies: No results found.    LOS: 14 days    Briant Cedar, MD [facility] 01/07/2023, 3:40 PM   If 7PM-7AM, please contact night-coverage [email or web address]

## 2023-01-07 NOTE — Progress Notes (Signed)
Physical Therapy Treatment Patient Details Name: Raymond [name] MRN: [id number] DOB: 10-11-1965 Today's Date: 01/07/2023   History of Present Illness 57 yo male presents to therapy following hospital admission on 12/24/2022 due to progressive R LE pain and B LE weakness and edema impacting functional mobility. Pt sustained several recent falls and once of which resulted in R lateral malleolus fx 12/06/2022 pt presented to ED and d/c home with brace. Pt had follow up OP with orthopedic 7/14 and continued recommendation for conservative management and R LE WBAT with CAM boot donned for 6-8 weeks. Pt subsequently fell again on 7/21 resulting in increased LE pain and edema and presented to ED no acute fx and d/c home. Pt returned to ED on 7/24 indicating he has been bed bound since 7/21 due to inability to WB through R LE with worsening B knee pain R > L.and new onset of LE weakness. Pt R lateral malleolar fx is now slightly displaced distal fibular fx and orthopedics consulted with ongoing recommendation for conservative management. DVT negative. Pt also found to have anasarca with B LE edema and suspect for cellulitis with wound weeping. CT of head and spine negative for acute findings. Pt has PMH of DM II, tobacco abuse, COPD, gout, HLD, HTN, OSA on CPAP.    PT Comments   Pt admitted with above diagnosis.  Pt currently with functional limitations due to the deficits listed below (see PT Problem List). Pt supine in bed when therapist arrived. Visitors and wife present whom elected to exit. Pt reported feeling like his R knee has rocks grinding and the leg is cramping and hard to straighten. CAM boot donned prior to tx session. A for donning L shoe.  Pt has be demonstrating progress with increased activity with pt performing SPT bed <> BSC with staff. Pt agreeable to therapy intervention and reported that he did have pain medication today. Pt required CGA x 2 for pull to stand from elevated EOB x 6 with a focus on use  of L UE support to improve eccentric control to EOB,  Due to pain and instability with R knee unable to progress with gait tasks today.  Pt d/c plan remains appropriate and pt will benefit from continued inpatient follow up therapy, <3 hours/day. Pt will benefit from acute skilled PT to increase their independence and safety with mobility to allow discharge.      If plan is discharge home, recommend the following: Two people to help with walking and/or transfers;A lot of help with bathing/dressing/bathroom;Assistance with cooking/housework;Assist for transportation   Can travel by private vehicle        Equipment Recommendations  None recommended by PT (pt reports DME in home setting)    Recommendations for Other Services       Precautions / Restrictions Precautions Precautions: Fall Required Braces or Orthoses: Other Brace Other Brace: R CAM boot Restrictions Weight Bearing Restrictions: No RLE Weight Bearing: Weight bearing as tolerated Other Position/Activity Restrictions: RLE WBAT with CAM boot donned, R ankle ROM as tolerated     Mobility  Bed Mobility Overal bed mobility: Modified Independent             General bed mobility comments: pt supine when PT arrived    Transfers Overall transfer level: Needs assistance Equipment used: Rolling walker (2 wheels) Transfers: Sit to/from Stand Sit to Stand: +2 safety/equipment, Contact guard assist           General transfer comment: for 6 sit to stands  with education on proper hand placement for improved eccentric control. patient pulling up on RW but no physical A, once in standing CGA and cues for posture and encouragement with pt limited standing tolernce ~30s per trial    Ambulation/Gait                   Stairs             Wheelchair Mobility     Tilt Bed    Modified Rankin (Stroke Patients Only)       Balance               Standing balance comment: improved static standing  extension posture and increased IND with CGA                            Cognition Arousal: Alert Behavior During Therapy: WFL for tasks assessed/performed Overall Cognitive Status: Within Functional Limits for tasks assessed                                 General Comments: patients wife was present during session        Exercises General Exercises - Lower Extremity Ankle Circles/Pumps:  (PT guided pt through R ankle ROM DF/PF and inversion and eversion to pt tolerance)    General Comments        Pertinent Vitals/Pain Pain Assessment Pain Assessment: 0-10 Pain Score: 10-Worst pain ever (2000) Pain Location: B LEs, R knee > R ankle Pain Descriptors / Indicators: Cramping, Constant, Discomfort (pt reported R knee feels like rocks grinding) Pain Intervention(s): Limited activity within patient's tolerance, Monitored during session, Premedicated before session, Repositioned    Home Living                          Prior Function            PT Goals (current goals can now be found in the care plan section) Acute Rehab PT Goals 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 PT Goal Formulation: With patient Time For Goal Achievement: 01/16/23 Potential to Achieve Goals: Good Progress towards PT goals: Progressing toward goals    Frequency    Min 1X/week      PT Plan Current plan remains appropriate    Co-evaluation PT/OT/SLP Co-Evaluation/Treatment: Yes Reason for Co-Treatment: Complexity of the patient's impairments (multi-system involvement);For patient/therapist safety;To address functional/ADL transfers PT goals addressed during session: Mobility/safety with mobility;Balance;Proper use of DME;Strengthening/ROM OT goals addressed during session: ADL's and self-care;Proper use of Adaptive equipment and DME      AM-PAC PT "6 Clicks" Mobility   Outcome Measure  Help needed turning from your back to your side  while in a flat bed without using bedrails?: None Help needed moving from lying on your back to sitting on the side of a flat bed without using bedrails?: None Help needed moving to and from a bed to a chair (including a wheelchair)?: A Lot Help needed standing up from a chair using your arms (e.g., wheelchair or bedside chair)?: A Lot Help needed to walk in hospital room?: Total Help needed climbing 3-5 steps with a railing? : Total 6 Click Score: 14    End of Session Equipment Utilized During Treatment: Gait belt Activity Tolerance: Patient limited by fatigue;No increased pain Patient left: in bed;with call bell/phone  within reach (seated EOB) Nurse Communication: Mobility status PT Visit Diagnosis: Unsteadiness on feet (R26.81);Other abnormalities of gait and mobility (R26.89);Repeated falls (R29.6);Difficulty in walking, not elsewhere classified (R26.2);Pain Pain - Right/Left:  (B) Pain - part of body: Knee;Leg     Time: 0981-1914 PT Time Calculation (min) (ACUTE ONLY): 22 min  Charges:    $Therapeutic Activity: 8-22 mins PT General Charges $$ ACUTE PT VISIT: 1 Visit                     Johnny Bridge, PT Acute Rehab    Jacqualyn Posey 01/07/2023, 4:05 PM

## 2023-01-07 NOTE — Progress Notes (Signed)
   01/07/23 1930  BiPAP/CPAP/SIPAP  BiPAP/CPAP/SIPAP Pt Type Adult (prefers self placement)  BiPAP/CPAP/SIPAP DREAMSTATIOND  Mask Type Full face mask (from home)  IPAP 18 cmH20  EPAP 6 cmH2O  FiO2 (%) 21 %  Patient Home Equipment No (mask and tubing from home)  Auto Titrate No  CPAP/SIPAP surface wiped down Yes  BiPAP/CPAP /SiPAP Vitals  Pulse Rate 94  Resp 17  SpO2 96 %  Bilateral Breath Sounds Diminished  MEWS Score/Color  MEWS Score 0  MEWS Score Color Green

## 2023-01-07 NOTE — Progress Notes (Signed)
Occupational Therapy Treatment Patient Details Name: Raymond [name] MRN: [id number] DOB: Dec 29, 1965 Today's Date: 01/07/2023   History of present illness 57 yo male presents to therapy following hospital admission on 12/24/2022 due to progressive R LE pain and B LE weakness and edema impacting functional mobility. Pt sustained several recent falls and once of which resulted in R lateral malleolus fx 12/06/2022 pt presented to ED and d/c home with brace. Pt had follow up OP with orthopedic 7/14 and continued recommendation for conservative management and R LE WBAT with CAM boot donned for 6-8 weeks. Pt subsequently fell again on 7/21 resulting in increased LE pain and edema and presented to ED no acute fx and d/c home. Pt returned to ED on 7/24 indicating he has been bed bound since 7/21 due to inability to WB through R LE with worsening B knee pain R > L.and new onset of LE weakness. Pt R lateral malleolar fx is now slightly displaced distal fibular fx and orthopedics consulted with ongoing recommendation for conservative management. DVT negative. Pt also found to have anasarca with B LE edema and suspect for cellulitis with wound weeping. CT of head and spine negative for acute findings. Pt has PMH of DM II, tobacco abuse, COPD, gout, HLD, HTN, OSA on CPAP.   OT comments  Patient was noted to make progress with min guard +2 for safety for sit to stands with noted buckling of BLE with standing for longer than 30 seconds. Patient noted to have increased discomfort in BLE with patient having taken pain meds prior to this session. Patient's discharge plan remains appropriate at this time. OT will continue to follow acutely.        If plan is discharge home, recommend the following:  Two people to help with walking and/or transfers;Two people to help with bathing/dressing/bathroom;Assist for transportation;Help with stairs or ramp for entrance   Equipment Recommendations  None recommended by OT        Precautions / Restrictions Precautions Precautions: Fall Required Braces or Orthoses: Other Brace Other Brace: R CAM boot Restrictions Weight Bearing Restrictions: No RLE Weight Bearing: Weight bearing as tolerated Other Position/Activity Restrictions: RLE WBAT with CAM boot donned, R ankle ROM as tolerated       Mobility Bed Mobility Overal bed mobility: Modified Independent                  Transfers Overall transfer level: Needs assistance Equipment used: Rolling walker (2 wheels) Transfers: Sit to/from Stand Sit to Stand: +2 safety/equipment, Contact guard assist           General transfer comment: for 6 sit to stands with education on proper hand placement. patient pulling up on RW but no physical A     Balance Overall balance assessment: History of Falls, Needs assistance Sitting-balance support: Feet supported Sitting balance-Leahy Scale: Good     Standing balance support: Bilateral upper extremity supported, During functional activity, Reliant on assistive device for balance Standing balance-Leahy Scale: Poor         ADL either performed or assessed with clinical judgement   ADL Overall ADL's : Needs assistance/impaired       General ADL Comments: patient reported having increased spasm like feeling in bilateral legs but still agreeable to participate in session. patient was educated on importance of trying through pain. patient was able to compelte 6 sit to stands with min guard +2 for stafety with no physical A from EOB. patient was educated on goal to  transition hands to pushing up v.s. pulling up on walker. patient was able to transition one hand back for about 4 of stand to sits with control to sit down with increased time. patient was educated on how this was progress in session and to keep trying at rehab placement. patient verbalized understanding.      Cognition Arousal: Alert Behavior During Therapy: WFL for tasks  assessed/performed Overall Cognitive Status: Within Functional Limits for tasks assessed                       Pertinent Vitals/ Pain       Pain Assessment Pain Assessment: Faces Faces Pain Scale: Hurts whole lot Pain Location: B LEs, R knee > R ankle Pain Descriptors / Indicators: Cramping, Constant, Discomfort Pain Intervention(s): Limited activity within patient's tolerance, Monitored during session, Premedicated before session, Repositioned         Frequency  Min 1X/week        Progress Toward Goals  OT Goals(current goals can now be found in the care plan section)  Progress towards OT goals: Progressing toward goals     Plan Discharge plan remains appropriate    Co-evaluation      Reason for Co-Treatment: Complexity of the patient's impairments (multi-system involvement);For patient/therapist safety;To address functional/ADL transfers PT goals addressed during session: Mobility/safety with mobility;Balance;Proper use of DME;Strengthening/ROM OT goals addressed during session: ADL's and self-care;Proper use of Adaptive equipment and DME      AM-PAC OT "6 Clicks" Daily Activity     Outcome Measure   Help from another person eating meals?: None Help from another person taking care of personal grooming?: A Little Help from another person toileting, which includes using toliet, bedpan, or urinal?: A Lot Help from another person bathing (including washing, rinsing, drying)?: A Lot Help from another person to put on and taking off regular upper body clothing?: A Little Help from another person to put on and taking off regular lower body clothing?: A Lot 6 Click Score: 16    End of Session Equipment Utilized During Treatment: Rolling walker (2 wheels);Gait belt  OT Visit Diagnosis: Unsteadiness on feet (R26.81);Other abnormalities of gait and mobility (R26.89);Muscle weakness (generalized) (M62.81)   Activity Tolerance Patient tolerated treatment well    Patient Left with call bell/phone within reach;in bed   Nurse Communication Mobility status        Time: 1610-9604 OT Time Calculation (min): 21 min  Charges: OT General Charges $OT Visit: 1 Visit  Rosalio Loud, MS Acute Rehabilitation Department Office# [phone]   Selinda Flavin 01/07/2023, 3:56 PM

## 2023-01-08 DIAGNOSIS — R627 Adult failure to thrive: Secondary | ICD-10-CM | POA: Diagnosis not present

## 2023-01-08 LAB — GLUCOSE, CAPILLARY
Glucose-Capillary: 159 mg/dL — ABNORMAL HIGH (ref 70–99)
Glucose-Capillary: 117 mg/dL — ABNORMAL HIGH (ref 70–99)
Glucose-Capillary: 212 mg/dL — ABNORMAL HIGH (ref 70–99)
Glucose-Capillary: 136 mg/dL — ABNORMAL HIGH (ref 70–99)

## 2023-01-08 MED ORDER — POTASSIUM CHLORIDE CRYS ER 20 MEQ PO TBCR
40.0000 meq | EXTENDED_RELEASE_TABLET | Freq: Two times a day (BID) | ORAL | Status: AC
  Administered 2023-01-08 (×2): 40 meq via ORAL
  Filled 2023-01-08 (×2): qty 2

## 2023-01-08 NOTE — Plan of Care (Signed)
  Problem: Education: Goal: Knowledge of General Education information will improve Description: Including pain rating scale, medication(s)/side effects and non-pharmacologic comfort measures Outcome: Progressing   Problem: Health Behavior/Discharge Planning: Goal: Ability to manage health-related needs will improve Outcome: Progressing   Problem: Clinical Measurements: Goal: Ability to maintain clinical measurements within normal limits will improve Outcome: Progressing Goal: Will remain free from infection Outcome: Progressing Goal: Diagnostic test results will improve Outcome: Progressing Goal: Respiratory complications will improve Outcome: Progressing Goal: Cardiovascular complication will be avoided Outcome: Progressing   Problem: Activity: Goal: Risk for activity intolerance will decrease Outcome: Progressing   Problem: Nutrition: Goal: Adequate nutrition will be maintained Outcome: Progressing   Problem: Coping: Goal: Level of anxiety will decrease Outcome: Progressing   Problem: Elimination: Goal: Will not experience complications related to bowel motility Outcome: Progressing Goal: Will not experience complications related to urinary retention Outcome: Progressing   Problem: Pain Managment: Goal: General experience of comfort will improve Outcome: Progressing   Problem: Safety: Goal: Ability to remain free from injury will improve Outcome: Progressing   Problem: Skin Integrity: Goal: Risk for impaired skin integrity will decrease Outcome: Progressing   Problem: Education: Goal: Ability to describe self-care measures that  prevent or decrease complications (Diabetes Survival Skills Education) will improve Outcome: Progressing Goal: Individualized Educational Video(s) Outcome: Progressing   Problem: Coping: Goal: Ability to adjust to condition or change in health will improve Outcome: Progressing   Problem: Fluid Volume: Goal: Ability to  maintain a balanced intake and output will improve Outcome: Progressing   Problem: Health Behavior/Discharge Planning: Goal: Ability to identify and utilize available resources and services will improve Outcome: Progressing Goal: Ability to manage health-related needs will improve Outcome: Progressing   Problem: Metabolic: Goal: Ability to maintain appropriate glucose levels will improve Outcome: Progressing   Problem: Nutritional: Goal: Maintenance of adequate nutrition will improve Outcome: Progressing Goal: Progress toward achieving an optimal weight will improve Outcome: Progressing   Problem: Skin Integrity: Goal: Risk for impaired skin integrity will decrease Outcome: Progressing   Problem: Tissue Perfusion: Goal: Adequacy of tissue perfusion will improve Outcome: Progressing   

## 2023-01-08 NOTE — Progress Notes (Signed)
PROGRESS NOTE    Raymond [name]  GMW:[id number] DOB: [birth year] DOA: 12/24/2022 PCP: Venetia Constable, MD   Brief Narrative: Raymond [name] is a 57 y.o. male with a history of obesity, hypertension, CKD, tobacco use, diabetes mellitus. Patient presented secondary to poor functional status and worsening lower extremity edema. Patient seen by orthopedic surgery with recommendation for conservative management of right fibular fracture. Patient underwent diuresis for lower extremity edema. Patient evaluated by PT/OT with recommendation for SNF on discharge   Assessment and Plan:  Anasarca Significant lower extremity edema Lower extremity venous duplex negative for DVT ABIs normal, complicated by movement during exam ECHO with normal LVEF with normal RV function S/p IV Lasix with good diuresis, switched to home bumex, continue TED hose Daily BMP  Displaced right fibular fracture Slightly displaced fibular fracture noted on imaging in setting of recent fall Orthopedic surgery was consulted with recommendation for conservative management, weightbearing as tolerated in cam boot and is nonweightbearing to right lower extremity out of cam boot.  Diabetes mellitus type 2 Continue SSI, accuchecks, hypoglycemic protocol  Tobacco use Patient received cessation counseling this admission. Continue nicotine patch  Hypokalemia Likely related to IV diuresis.  Potassium repleted as needed.  Hypertension Continue losartan, amlodipine, doxazosin, added PO hydralazine Continue IV hydralazine PRN  CKD stage IIIa Baseline creatinine appears to be around 1.5  Right lower extremity numbness Per patient and his wife, patient started having symptoms after a fall involving a fall on his back No associated back pain CT scan of lumbar spine significant for foraminal stenosis greatest at L4-5 with right greater than left in addition to moderate foraminal stenosis bilaterally at L3-4.  Right leg is  hyperreflexic MRI without critical pathology Patient should follow-up outpatient with orthopedic surgery  Vitamin D deficiency Continue vitamin D supplementation  Constipation Continue MiraLAX, Senokot, Colace Lactulose PRN  Morbid obesity Estimated body mass index is 46.8 kg/m as calculated from the following:   Height as of this encounter: 5\' 11"  (1.803 m).   Weight as of this encounter: 152.2 kg. Dietitian consulted per patient request; outpatient dietitian follow-up Lifestyle modification advised     DVT prophylaxis: Lovenox Code Status:   Code Status: Full Code Family Communication: Wife at bedside. Disposition Plan: Discharge to SNF once bed is available and insurance auth is approved   Consultants:  Orthopedic surgery  Procedures:  None  Antimicrobials: None   Subjective: Denies any new complaints. Wears CPAP most of the time     Objective: BP (!) 164/106 (BP Location: Right Arm)   Pulse 84   Temp 97.9 F (36.6 C) (Oral)   Resp 20   Ht 5\' 11"  (1.803 m)   Wt (!) 152.2 kg   SpO2 98%   BMI 46.80 kg/m    Examination: General: NAD  Cardiovascular: S1, S2 present Respiratory: CTAB Abdomen: Soft, nontender, nondistended, bowel sounds present Musculoskeletal: 2+ bilateral pedal edema noted, RLE in CAM boot Skin: Normal Psychiatry: Normal mood     Data Reviewed: I have personally reviewed following labs and imaging studies  CBC Lab Results  Component Value Date   WBC 10.1 01/08/2023   RBC 3.36 (L) 01/08/2023   HGB 10.0 (L) 01/08/2023   HCT 31.4 (L) 01/08/2023   MCV 93.5 01/08/2023   MCH 29.8 01/08/2023   PLT 355 01/08/2023   MCHC 31.8 01/08/2023   RDW 15.7 (H) 01/08/2023   LYMPHSABS 1.8 01/08/2023   MONOABS 0.9 01/08/2023   EOSABS 0.4 01/08/2023  BASOSABS 0.1 01/08/2023     Last metabolic panel Lab Results  Component Value Date   NA 137 01/08/2023   K 3.1 (L) 01/08/2023   CL 100 01/08/2023   CO2 27 01/08/2023   BUN 23 (H)  01/08/2023   CREATININE 1.32 (H) 01/08/2023   GLUCOSE 169 (H) 01/08/2023   GFRNONAA >60 01/08/2023   GFRAA >90 07/19/2014   CALCIUM 9.1 01/08/2023   PROT 7.2 12/31/2022   ALBUMIN 3.3 (L) 12/31/2022   BILITOT 0.7 12/31/2022   ALKPHOS 81 12/31/2022   AST 20 12/31/2022   ALT 28 12/31/2022   ANIONGAP 10 01/08/2023    GFR: Estimated Creatinine Clearance: 90.4 mL/min (A) (by C-G formula based on SCr of 1.32 mg/dL (H)).  No results found for this or any previous visit (from the past 240 hour(s)).    Radiology Studies: No results found.    LOS: 15 days    Briant Cedar, MD [facility] 01/08/2023, 2:45 PM   If 7PM-7AM, please contact night-coverage [email or web address]

## 2023-01-08 NOTE — Progress Notes (Signed)
   01/08/23 1958  BiPAP/CPAP/SIPAP  BiPAP/CPAP/SIPAP Pt Type  (prefers self placement)  BiPAP/CPAP/SIPAP DREAMSTATIOND  Mask Type Full face mask  IPAP 18 cmH20  EPAP 6 cmH2O  FiO2 (%) 21 %  Auto Titrate No  CPAP/SIPAP surface wiped down Yes  BiPAP/CPAP /SiPAP Vitals  Pulse Rate 67  Resp 19  Bilateral Breath Sounds Diminished

## 2023-01-08 NOTE — Progress Notes (Signed)
   01/08/23 2125  BiPAP/CPAP/SIPAP  BiPAP/CPAP/SIPAP Pt Type Adult  BiPAP/CPAP/SIPAP DREAMSTATIOND (pt self places mask)  Mask Type Full face mask  Mask Size Large  IPAP 18 cmH20  EPAP 6 cmH2O  FiO2 (%) 21 %

## 2023-01-08 NOTE — TOC Progression Note (Signed)
Transition of Care [facility]) - Progression Note    Patient Details  Name: Keyonta Livolsi Steele MRN: [id number] Date of Birth: 10/08/1965  Transition of [facility]) CM/SW Contact  Beckie Busing, RN Phone Number:[phone]  01/08/2023, 12:39 PM  Clinical Narrative:    CM has reached out to [place] at [facility] for update on insurance auth. Per [employer]ley Harvey is still pending for nurse review.    Expected Discharge Plan: Skilled Nursing Facility Barriers to Discharge: Continued Medical Work up  Expected Discharge Plan and Services                                               Social Determinants of Health (SDOH) Interventions SDOH Screenings   Food Insecurity: No Food Insecurity (12/24/2022)  Housing: Low Risk  (12/24/2022)  Transportation Needs: No Transportation Needs (12/24/2022)  Utilities: Not At Risk (12/24/2022)  Social Connections: Unknown (01/10/2022)   Received from [facility], [facility]  Stress: No Stress Concern Present (03/21/2022)   Received from [facility], [facility]  Tobacco Use: High Risk (12/24/2022)    Readmission Risk Interventions    12/25/2022    4:24 PM  Readmission Risk Prevention Plan  Post Dischage Appt Complete  Medication Screening Complete  Transportation Screening Complete

## 2023-01-09 DIAGNOSIS — R627 Adult failure to thrive: Secondary | ICD-10-CM | POA: Diagnosis not present

## 2023-01-09 LAB — GLUCOSE, CAPILLARY
Glucose-Capillary: 177 mg/dL — ABNORMAL HIGH (ref 70–99)
Glucose-Capillary: 183 mg/dL — ABNORMAL HIGH (ref 70–99)
Glucose-Capillary: 114 mg/dL — ABNORMAL HIGH (ref 70–99)
Glucose-Capillary: 163 mg/dL — ABNORMAL HIGH (ref 70–99)

## 2023-01-09 NOTE — Progress Notes (Addendum)
Physical Therapy Treatment Patient Details Name: Raymond [name] MRN: [id number] DOB: Aug 01, 1965 Today's Date: 01/09/2023   History of Present Illness 57 yo male presents to therapy following hospital admission on 12/24/2022 due to progressive R LE pain and B LE weakness and edema impacting functional mobility. Pt sustained several recent falls and once of which resulted in R lateral malleolus fx 12/06/2022 pt presented to ED and d/c home with brace. Pt had follow up OP with orthopedic 7/14 and continued recommendation for conservative management and R LE WBAT with CAM boot donned for 6-8 weeks. Pt subsequently fell again on 7/21 resulting in increased LE pain and edema and presented to ED no acute fx and d/c home. Pt returned to ED on 7/24 indicating he has been bed bound since 7/21 due to inability to WB through R LE with worsening B knee pain R > L.and new onset of LE weakness. Pt R lateral malleolar fx is now slightly displaced distal fibular fx and orthopedics consulted with ongoing recommendation for conservative management. DVT negative. Pt also found to have anasarca with B LE edema and suspect for cellulitis with wound weeping. CT of head and spine negative for acute findings. Pt has PMH of DM II, tobacco abuse, COPD, gout, HLD, HTN, OSA on CPAP.    PT Comments   Pt admitted with above diagnosis.  Pt currently with functional limitations due to the deficits listed below (see PT Problem List). PT arrived in am and discussed pain medication prior to therapy with spouse due to pt sleeping, however pt did not request pain medication. PT returned later in the day, pt in bed and agreeable to therapy. Spouse donned R CAM boot and L standard chair. Pt is mod I with supine <> sit. 5 standing trials from elevated EOB pull to stand with mod A for sit to stand. Pt required cues for posture and coordinated breathing with CGA once in standing, pt limited with standing tolerance < 40s due to R LE pain, instability and R  hamstring cramping. Pt able to side step toward L to HOB x 2 steps with min A and RW. Pt guided though bridging and R hamstring stretch as well as self hamstring stretch with use of blanket/sheet with pt performing return demonstration. Pt instructed to perform hamstring stretch to address cramping and spasms. PT communicated with nurse whom provided pain medication and mm relaxer during tx session. PT provided pt with CP and instruction on proper use and educated on requesting pain medication, mm relaxer as well as CP to address pain/spasms. Pt is apparently awaiting insurance authorization to d/c to SNF for short term rehab and current plan remains appropriate. Pt left in bed all needs in place and spouse present. Pt will benefit from acute skilled PT to increase their independence and safety with mobility to allow discharge.      If plan is discharge home, recommend the following: Two people to help with walking and/or transfers;A lot of help with bathing/dressing/bathroom;Assistance with cooking/housework;Assist for transportation   Can travel by private vehicle        Equipment Recommendations  None recommended by PT (pt reports DME in home setting)    Recommendations for Other Services       Precautions / Restrictions Precautions Precautions: Fall Required Braces or Orthoses: Other Brace Other Brace: R CAM boot Restrictions Weight Bearing Restrictions: No RLE Weight Bearing: Weight bearing as tolerated Other Position/Activity Restrictions: RLE WBAT with CAM boot donned, R ankle ROM as tolerated  Mobility  Bed Mobility Overal bed mobility: Modified Independent Bed Mobility: Supine to Sit, Sit to Supine           General bed mobility comments: pt in bed when PT arrived    Transfers Overall transfer level: Needs assistance Equipment used: Rolling walker (2 wheels) Transfers: Sit to/from Stand Sit to Stand: From elevated surface, Mod assist           General  transfer comment: 5 x sit to stand from elevated EOB with pt pull to stand and min to mod A, limited standing tolerance < 40s due to R LE instabilty and hamstring spasm/cramp pt requires cues for coordinated breathing and trunk extension once in standing. R CAM boot and L standard shoe donned    Ambulation/Gait Ambulation/Gait assistance: Min assist Gait Distance (Feet): 1 Feet (2 steps lateraly toward HOB to L) Assistive device: Rolling walker (2 wheels) Gait Pattern/deviations: Step-to pattern, Knees buckling, Trunk flexed       General Gait Details: pt limited today with gait trials due to pain, LE instabilty and R hamstring cramp/spasm   Stairs             Wheelchair Mobility     Tilt Bed    Modified Rankin (Stroke Patients Only)       Balance Overall balance assessment: History of Falls, Needs assistance Sitting-balance support: Feet supported Sitting balance-Leahy Scale: Good     Standing balance support: Bilateral upper extremity supported, During functional activity, Reliant on assistive device for balance Standing balance-Leahy Scale: Poor Standing balance comment: improved static standing extension posture and increased IND with CGA                            Cognition Arousal: Alert Behavior During Therapy: WFL for tasks assessed/performed Overall Cognitive Status: Within Functional Limits for tasks assessed                                 General Comments: patients wife was present during session        Exercises      General Comments General comments (skin integrity, edema, etc.): B LE edema      Pertinent Vitals/Pain Pain Assessment Pain Assessment: 0-10 Pain Score: 8  Faces Pain Scale: Hurts whole lot Breathing: normal Pain Location: B LEs, R knee > R ankle Pain Descriptors / Indicators: Cramping, Constant, Discomfort Pain Intervention(s): Limited activity within patient's tolerance, Monitored during session,  Premedicated before session, Repositioned, Patient requesting pain meds-RN notified, RN gave pain meds during session, Ice applied    Home Living Family/patient expects to be discharged to:: Private residence Living Arrangements: Spouse/significant other Available Help at Discharge: Family;Available 24 hours/day Type of Home: House Home Access: Ramped entrance       Home Layout: One level Home Equipment: Agricultural consultant (2 wheels);BSC/3in1;Wheelchair - Careers adviser (comment) (lift chair)      Prior Function            PT Goals (current goals can now be found in the care plan section) Acute Rehab PT Goals 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 PT Goal Formulation: With patient Time For Goal Achievement: 01/16/23 Potential to Achieve Goals: Good Progress towards PT goals: Progressing toward goals    Frequency    Min 1X/week      PT Plan      Co-evaluation  AM-PAC PT "6 Clicks" Mobility   Outcome Measure  Help needed turning from your back to your side while in a flat bed without using bedrails?: None Help needed moving from lying on your back to sitting on the side of a flat bed without using bedrails?: None Help needed moving to and from a bed to a chair (including a wheelchair)?: A Lot Help needed standing up from a chair using your arms (e.g., wheelchair or bedside chair)?: A Lot Help needed to walk in hospital room?: Total Help needed climbing 3-5 steps with a railing? : Total 6 Click Score: 14    End of Session Equipment Utilized During Treatment: Gait belt Activity Tolerance: Patient limited by fatigue;Patient limited by pain Patient left: in bed;with call bell/phone within reach;with family/visitor present Nurse Communication: Mobility status PT Visit Diagnosis: Unsteadiness on feet (R26.81);Other abnormalities of gait and mobility (R26.89);Repeated falls (R29.6);Difficulty in walking, not elsewhere classified  (R26.2);Pain Pain - Right/Left:  (B) Pain - part of body: Knee;Leg     Time: 6440-3474 PT Time Calculation (min) (ACUTE ONLY): 31 min  Charges:    $Therapeutic Exercise: 8-22 mins $Therapeutic Activity: 8-22 mins PT General Charges $$ ACUTE PT VISIT: 1 Visit                     Johnny Bridge, PT Acute Rehab    Jacqualyn Posey 01/09/2023, 4:52 PM

## 2023-01-09 NOTE — Progress Notes (Signed)
PROGRESS NOTE    Raymond [name]  WUJ:[id number] DOB: 1966-02-14 DOA: 12/24/2022 PCP: Venetia Constable, MD   Brief Narrative: Raymond [name] is a 57 y.o. male with a history of obesity, hypertension, CKD, tobacco use, diabetes mellitus. Patient presented secondary to poor functional status and worsening lower extremity edema. Patient seen by orthopedic surgery with recommendation for conservative management of right fibular fracture. Patient underwent diuresis for lower extremity edema. Patient evaluated by PT/OT with recommendation for SNF on discharge   Assessment and Plan:  Anasarca Significant lower extremity edema Lower extremity venous duplex negative for DVT ABIs normal, complicated by movement during exam ECHO with normal LVEF with normal RV function S/p IV Lasix with good diuresis, switched to home bumex, continue TED hose Daily BMP  Displaced right fibular fracture Slightly displaced fibular fracture noted on imaging in setting of recent fall Orthopedic surgery was consulted with recommendation for conservative management, weightbearing as tolerated in cam boot and is nonweightbearing to right lower extremity out of cam boot.  Diabetes mellitus type 2 Continue SSI, accuchecks, hypoglycemic protocol  Tobacco use Patient received cessation counseling this admission. Continue nicotine patch  Hypokalemia Likely related to IV diuresis.  Potassium repleted as needed.  Hypertension Continue losartan, amlodipine, doxazosin, added PO hydralazine Continue IV hydralazine PRN  CKD stage IIIa Baseline creatinine appears to be around 1.5  Right lower extremity numbness Per patient and his wife, patient started having symptoms after a fall involving a fall on his back No associated back pain CT scan of lumbar spine significant for foraminal stenosis greatest at L4-5 with right greater than left in addition to moderate foraminal stenosis bilaterally at L3-4.  Right leg is  hyperreflexic MRI without critical pathology Patient should follow-up outpatient with orthopedic surgery  Vitamin D deficiency Continue vitamin D supplementation  Constipation Continue MiraLAX, Senokot, Colace Lactulose PRN  Morbid obesity Estimated body mass index is 47.17 kg/m as calculated from the following:   Height as of this encounter: 5\' 11"  (1.803 m).   Weight as of this encounter: 153.4 kg. Dietitian consulted per patient request; outpatient dietitian follow-up Lifestyle modification advised     DVT prophylaxis: Lovenox Code Status:   Code Status: Full Code Family Communication: Wife at bedside. Disposition Plan: Discharge to SNF once bed is available and insurance auth is approved   Consultants:  Orthopedic surgery  Procedures:  None  Antimicrobials: None   Subjective: Denies any new complaints     Objective: BP (!) 140/53 (BP Location: Right Arm)   Pulse 75   Temp 98.6 F (37 C) (Oral)   Resp 20   Ht 5\' 11"  (1.803 m)   Wt (!) 153.4 kg   SpO2 96%   BMI 47.17 kg/m    Examination: General: NAD  Cardiovascular: S1, S2 present Respiratory: CTAB Abdomen: Soft, nontender, nondistended, bowel sounds present Musculoskeletal: 2+ bilateral pedal edema noted, RLE in CAM boot Skin: Normal Psychiatry: Normal mood     Data Reviewed: I have personally reviewed following labs and imaging studies  CBC Lab Results  Component Value Date   WBC 10.4 01/09/2023   RBC 3.27 (L) 01/09/2023   HGB 9.7 (L) 01/09/2023   HCT 30.6 (L) 01/09/2023   MCV 93.6 01/09/2023   MCH 29.7 01/09/2023   PLT 362 01/09/2023   MCHC 31.7 01/09/2023   RDW 15.7 (H) 01/09/2023   LYMPHSABS 1.6 01/09/2023   MONOABS 1.1 (H) 01/09/2023   EOSABS 0.4 01/09/2023   BASOSABS 0.1 01/09/2023  Last metabolic panel Lab Results  Component Value Date   NA 137 01/09/2023   K 3.7 01/09/2023   CL 100 01/09/2023   CO2 25 01/09/2023   BUN 21 (H) 01/09/2023   CREATININE 1.26  (H) 01/09/2023   GLUCOSE 132 (H) 01/09/2023   GFRNONAA >60 01/09/2023   GFRAA >90 07/19/2014   CALCIUM 9.0 01/09/2023   PROT 7.2 12/31/2022   ALBUMIN 3.3 (L) 12/31/2022   BILITOT 0.7 12/31/2022   ALKPHOS 81 12/31/2022   AST 20 12/31/2022   ALT 28 12/31/2022   ANIONGAP 12 01/09/2023    GFR: Estimated Creatinine Clearance: 95.1 mL/min (A) (by C-G formula based on SCr of 1.26 mg/dL (H)).  No results found for this or any previous visit (from the past 240 hour(s)).    Radiology Studies: No results found.    LOS: 16 days    Briant Cedar, MD [facility] 01/09/2023, 4:08 PM   If 7PM-7AM, please contact night-coverage [email or web address]

## 2023-01-09 NOTE — Progress Notes (Signed)
   01/09/23 1918  BiPAP/CPAP/SIPAP  BiPAP/CPAP/SIPAP Pt Type Adult  BiPAP/CPAP/SIPAP DREAMSTATIOND  Mask Type Full face mask  Mask Size Large  FiO2 (%) 21 %  Patient Home Equipment No  Auto Titrate Yes (Imax 25 Emin 6 min/max PS 8/8)  CPAP/SIPAP surface wiped down Yes  BiPAP/CPAP /SiPAP Vitals  Pulse Rate 78  Resp 18  SpO2 96 %  Bilateral Breath Sounds Clear;Diminished  MEWS Score/Color  MEWS Score 0  MEWS Score Color Green   Patient complains that he often feel as if his breath is "cut off" and he "can't get air" during use of BiPAP. Settings changed to auto titration as documented above, for patient comfort.

## 2023-01-09 NOTE — TOC Progression Note (Signed)
Transition of Care [facility]) - Progression Note    Patient Details  Name: Raymond [name] MRN: [id number] Date of Birth: Jun 18, 1965  Transition of Care [facility]) CM/SW Contact  Beckie Busing, RN Phone Number:[phone]  01/09/2023, 3:04 PM  Clinical Narrative:    Cm reached out to [facility] admissions director for [facility] to determine if insurance was authorized. Per [employer] is still in review. Patients significant other at nurses station stating that she was told by insurance that patient has been approved. CM reached back out to [place] to verify. Per [employer] Harvey is being reviewed by nurse.    Expected Discharge Plan: Skilled Nursing Facility Barriers to Discharge: Continued Medical Work up  Expected Discharge Plan and Services                                               Social Determinants of Health (SDOH) Interventions SDOH Screenings   Food Insecurity: No Food Insecurity (12/24/2022)  Housing: Low Risk  (12/24/2022)  Transportation Needs: No Transportation Needs (12/24/2022)  Utilities: Not At Risk (12/24/2022)  Social Connections: Unknown (01/10/2022)   Received from [facility], [facility]  Stress: No Stress Concern Present (03/21/2022)   Received from [facility], [facility]  Tobacco Use: High Risk (12/24/2022)    Readmission Risk Interventions    12/25/2022    4:24 PM  Readmission Risk Prevention Plan  Post Dischage Appt Complete  Medication Screening Complete  Transportation Screening Complete

## 2023-01-10 DIAGNOSIS — R627 Adult failure to thrive: Secondary | ICD-10-CM | POA: Diagnosis not present

## 2023-01-10 LAB — GLUCOSE, CAPILLARY
Glucose-Capillary: 138 mg/dL — ABNORMAL HIGH (ref 70–99)
Glucose-Capillary: 198 mg/dL — ABNORMAL HIGH (ref 70–99)
Glucose-Capillary: 137 mg/dL — ABNORMAL HIGH (ref 70–99)
Glucose-Capillary: 160 mg/dL — ABNORMAL HIGH (ref 70–99)

## 2023-01-10 MED ORDER — PRAMIPEXOLE DIHYDROCHLORIDE 0.25 MG PO TABS
0.5000 mg | ORAL_TABLET | Freq: Every day | ORAL | Status: DC
  Administered 2023-01-10 – 2023-01-12 (×3): 0.5 mg via ORAL
  Filled 2023-01-10 (×3): qty 2

## 2023-01-10 MED ORDER — CARVEDILOL 25 MG PO TABS
25.0000 mg | ORAL_TABLET | Freq: Two times a day (BID) | ORAL | Status: DC
  Administered 2023-01-10 – 2023-01-12 (×4): 25 mg via ORAL
  Filled 2023-01-10 (×4): qty 1

## 2023-01-10 MED ORDER — POTASSIUM CHLORIDE CRYS ER 20 MEQ PO TBCR
40.0000 meq | EXTENDED_RELEASE_TABLET | Freq: Two times a day (BID) | ORAL | Status: AC
  Administered 2023-01-10 (×2): 40 meq via ORAL
  Filled 2023-01-10 (×2): qty 2

## 2023-01-10 MED ORDER — DULOXETINE HCL 60 MG PO CPEP: 60.0000 mg | ORAL_CAPSULE | Freq: Every day | ORAL | Status: DC

## 2023-01-10 NOTE — TOC Progression Note (Signed)
Transition of Care [facility]) - Progression Note    Patient Details  Name: Raymond [name] MRN: [id number] Date of Birth: 1965-10-19  Transition of Care [facility]) CM/SW Contact  Georgie Chard, LCSW Phone Number: 01/10/2023, 12:32 PM  Clinical Narrative:    CSW has reached out to the facility at this time AUTH is still pending. Facility will update TOC once approved.   Expected Discharge Plan: Skilled Nursing Facility Barriers to Discharge: Continued Medical Work up  Expected Discharge Plan and Services                                               Social Determinants of Health (SDOH) Interventions SDOH Screenings   Food Insecurity: No Food Insecurity (12/24/2022)  Housing: Low Risk  (12/24/2022)  Transportation Needs: No Transportation Needs (12/24/2022)  Utilities: Not At Risk (12/24/2022)  Social Connections: Unknown (01/10/2022)   Received from [facility], [facility]  Stress: No Stress Concern Present (03/21/2022)   Received from [facility], [facility]  Tobacco Use: High Risk (12/24/2022)    Readmission Risk Interventions    12/25/2022    4:24 PM  Readmission Risk Prevention Plan  Post Dischage Appt Complete  Medication Screening Complete  Transportation Screening Complete

## 2023-01-10 NOTE — Progress Notes (Signed)
Mobility Specialist - Progress Note   01/10/23 1145  Mobility  Activity Stood at bedside  Level of Assistance Moderate assist, patient does 50-74%  Assistive Device Front wheel walker  RLE Weight Bearing WBAT  Activity Response Tolerated well  Mobility Referral Yes  $Mobility charge 1 Mobility  Mobility Specialist Start Time (ACUTE ONLY) 1121  Mobility Specialist Stop Time (ACUTE ONLY) 1144  Mobility Specialist Time Calculation (min) (ACUTE ONLY) 23 min   Pt received in bed and agreeable to get OOB. Pt tolerated x2 STS. See below for times. No complaints during session. Pt to EOB after session with all needs met.    Stand #1: ~45sec Stand #2: ~65min 2sec  Schuyler Amor Mobility Specialist

## 2023-01-10 NOTE — Progress Notes (Signed)
PROGRESS NOTE    Raymond [name]  YQM:[id number] DOB: 1966-01-15 DOA: 12/24/2022 PCP: Venetia Constable, MD   Brief Narrative: Raymond [name] is a 57 y.o. male with a history of obesity, hypertension, CKD, tobacco use, diabetes mellitus. Patient presented secondary to poor functional status and worsening lower extremity edema. Patient seen by orthopedic surgery with recommendation for conservative management of right fibular fracture. Patient underwent diuresis for lower extremity edema. Patient evaluated by PT/OT with recommendation for SNF on discharge   Assessment and Plan:  Anasarca Significant lower extremity edema Lower extremity venous duplex negative for DVT ABIs normal, complicated by movement during exam ECHO with normal LVEF with normal RV function S/p IV Lasix with good diuresis, switched to home bumex, continue TED hose Daily BMP  Displaced right fibular fracture Slightly displaced fibular fracture noted on imaging in setting of recent fall Orthopedic surgery was consulted with recommendation for conservative management, weightbearing as tolerated in cam boot and is nonweightbearing to right lower extremity out of cam boot.  Diabetes mellitus type 2 Continue SSI, accuchecks, hypoglycemic protocol  Tobacco use Patient received cessation counseling this admission. Continue nicotine patch  Hypokalemia Likely related to IV diuresis.  Potassium repleted as needed.  Hypertension Continue losartan, amlodipine, doxazosin, restarted PTA coreg  Continue IV hydralazine PRN  CKD stage IIIa Baseline creatinine appears to be around 1.5  Right lower extremity numbness Per patient and his wife, patient started having symptoms after a fall involving a fall on his back No associated back pain CT scan of lumbar spine significant for foraminal stenosis greatest at L4-5 with right greater than left in addition to moderate foraminal stenosis bilaterally at L3-4.  Right leg is  hyperreflexic MRI without critical pathology Patient should follow-up outpatient with orthopedic surgery  Vitamin D deficiency Continue vitamin D supplementation  Constipation Continue MiraLAX, Senokot, Colace Lactulose PRN  Morbid obesity Estimated body mass index is 47.17 kg/m as calculated from the following:   Height as of this encounter: 5\' 11"  (1.803 m).   Weight as of this encounter: 153.4 kg. Dietitian consulted per patient request; outpatient dietitian follow-up Lifestyle modification advised     DVT prophylaxis: Lovenox Code Status:   Code Status: Full Code Family Communication: Wife at bedside. Disposition Plan: Discharge to SNF once bed is available and insurance auth is approved   Consultants:  Orthopedic surgery  Procedures:  None  Antimicrobials: None   Subjective: Denies any new complaints     Objective: BP 116/66 (BP Location: Right Arm)   Pulse 81   Temp 97.9 F (36.6 C) (Oral)   Resp 12   Ht 5\' 11"  (1.803 m)   Wt (!) 153.4 kg   SpO2 97%   BMI 47.17 kg/m    Examination: General: NAD  Cardiovascular: S1, S2 present Respiratory: CTAB Abdomen: Soft, nontender, nondistended, bowel sounds present Musculoskeletal: 2+ bilateral pedal edema noted, RLE in CAM boot Skin: Normal Psychiatry: Normal mood     Data Reviewed: I have personally reviewed following labs and imaging studies  CBC Lab Results  Component Value Date   WBC 10.0 01/10/2023   RBC 3.37 (L) 01/10/2023   HGB 9.9 (L) 01/10/2023   HCT 31.7 (L) 01/10/2023   MCV 94.1 01/10/2023   MCH 29.4 01/10/2023   PLT 350 01/10/2023   MCHC 31.2 01/10/2023   RDW 15.5 01/10/2023   LYMPHSABS 1.9 01/10/2023   MONOABS 1.0 01/10/2023   EOSABS 0.5 01/10/2023   BASOSABS 0.1 01/10/2023  Last metabolic panel Lab Results  Component Value Date   NA 137 01/10/2023   K 3.4 (L) 01/10/2023   CL 99 01/10/2023   CO2 28 01/10/2023   BUN 26 (H) 01/10/2023   CREATININE 1.50 (H)  01/10/2023   GLUCOSE 142 (H) 01/10/2023   GFRNONAA 53 (L) 01/10/2023   GFRAA >90 07/19/2014   CALCIUM 8.9 01/10/2023   PROT 7.2 12/31/2022   ALBUMIN 3.3 (L) 12/31/2022   BILITOT 0.7 12/31/2022   ALKPHOS 81 12/31/2022   AST 20 12/31/2022   ALT 28 12/31/2022   ANIONGAP 10 01/10/2023    GFR: Estimated Creatinine Clearance: 79.9 mL/min (A) (by C-G formula based on SCr of 1.5 mg/dL (H)).  No results found for this or any previous visit (from the past 240 hour(s)).    Radiology Studies: No results found.    LOS: 17 days    Briant Cedar, MD [facility] 01/10/2023, 2:52 PM   If 7PM-7AM, please contact night-coverage [email or web address]

## 2023-01-10 NOTE — Progress Notes (Signed)
   01/10/23 2115  BiPAP/CPAP/SIPAP  BiPAP/CPAP/SIPAP Pt Type Adult (Pt already on the machine prefer self placment)  BiPAP/CPAP/SIPAP DREAMSTATIOND  Mask Type Full face mask  Mask Size Large  FiO2 (%) 21 %  Patient Home Equipment No  Auto Titrate Yes (imax 25 emin 6 PS 8/8)  CPAP/SIPAP surface wiped down Yes  BiPAP/CPAP /SiPAP Vitals  SpO2 97 %  Bilateral Breath Sounds Clear;Diminished

## 2023-01-11 DIAGNOSIS — R627 Adult failure to thrive: Secondary | ICD-10-CM | POA: Diagnosis not present

## 2023-01-11 LAB — CBC WITH DIFFERENTIAL/PLATELET
Abs Immature Granulocytes: 0.06 10*3/uL (ref 0.00–0.07)
Basophils Absolute: 0.1 10*3/uL (ref 0.0–0.1)
Basophils Relative: 1 %
Eosinophils Absolute: 0.5 10*3/uL (ref 0.0–0.5)
Eosinophils Relative: 5 %
HCT: 32.2 % — ABNORMAL LOW (ref 39.0–52.0)
Hemoglobin: 10.4 g/dL — ABNORMAL LOW (ref 13.0–17.0)
Immature Granulocytes: 1 %
Lymphocytes Relative: 20 %
Lymphs Abs: 2 10*3/uL (ref 0.7–4.0)
MCH: 29.9 pg (ref 26.0–34.0)
MCHC: 32.3 g/dL (ref 30.0–36.0)
MCV: 92.5 fL (ref 80.0–100.0)
Monocytes Absolute: 0.9 10*3/uL (ref 0.1–1.0)
Monocytes Relative: 9 %
Neutro Abs: 6.6 10*3/uL (ref 1.7–7.7)
Neutrophils Relative %: 64 %
Platelets: 354 10*3/uL (ref 150–400)
RBC: 3.48 MIL/uL — ABNORMAL LOW (ref 4.22–5.81)
RDW: 15.7 % — ABNORMAL HIGH (ref 11.5–15.5)
WBC: 10.2 10*3/uL (ref 4.0–10.5)
nRBC: 0 % (ref 0.0–0.2)

## 2023-01-11 LAB — BASIC METABOLIC PANEL WITH GFR
Anion gap: 10 (ref 5–15)
BUN: 25 mg/dL — ABNORMAL HIGH (ref 6–20)
CO2: 27 mmol/L (ref 22–32)
Calcium: 9.3 mg/dL (ref 8.9–10.3)
Chloride: 99 mmol/L (ref 98–111)
Creatinine, Ser: 1.38 mg/dL — ABNORMAL HIGH (ref 0.61–1.24)
GFR, Estimated: 59 mL/min — ABNORMAL LOW (ref >60)
Glucose, Bld: 141 mg/dL — ABNORMAL HIGH (ref 70–99)
Potassium: 3.7 mmol/L (ref 3.5–5.1)
Sodium: 136 mmol/L (ref 135–145)

## 2023-01-11 LAB — GLUCOSE, CAPILLARY
Glucose-Capillary: 136 mg/dL — ABNORMAL HIGH (ref 70–99)
Glucose-Capillary: 211 mg/dL — ABNORMAL HIGH (ref 70–99)
Glucose-Capillary: 141 mg/dL — ABNORMAL HIGH (ref 70–99)
Glucose-Capillary: 149 mg/dL — ABNORMAL HIGH (ref 70–99)

## 2023-01-11 NOTE — Plan of Care (Signed)

## 2023-01-11 NOTE — Progress Notes (Signed)
PROGRESS NOTE    Raymond [name]  ZOX:[id number] DOB: 1966-04-06 DOA: 12/24/2022 PCP: Venetia Constable, MD   Brief Narrative: Raymond [name] is a 57 y.o. male with a history of obesity, hypertension, CKD, tobacco use, diabetes mellitus. Patient presented secondary to poor functional status and worsening lower extremity edema. Patient seen by orthopedic surgery with recommendation for conservative management of right fibular fracture. Patient underwent diuresis for lower extremity edema. Patient evaluated by PT/OT with recommendation for SNF on discharge   Assessment and Plan:  Anasarca Significant lower extremity edema Lower extremity venous duplex negative for DVT ABIs normal, complicated by movement during exam ECHO with normal LVEF with normal RV function S/p IV Lasix with good diuresis, switched to home bumex, continue TED hose Daily BMP  Displaced right fibular fracture Slightly displaced fibular fracture noted on imaging in setting of recent fall Orthopedic surgery was consulted with recommendation for conservative management, weightbearing as tolerated in cam boot and is nonweightbearing to right lower extremity out of cam boot.  Diabetes mellitus type 2 Continue SSI, accuchecks, hypoglycemic protocol  Tobacco use Patient received cessation counseling this admission. Continue nicotine patch  Hypokalemia Likely related to IV diuresis.  Potassium repleted as needed.  Hypertension Continue losartan, amlodipine, doxazosin, restarted PTA coreg  Continue IV hydralazine PRN  CKD stage IIIa Baseline creatinine appears to be around 1.5  Right lower extremity numbness Per patient and his wife, patient started having symptoms after a fall involving a fall on his back No associated back pain CT scan of lumbar spine significant for foraminal stenosis greatest at L4-5 with right greater than left in addition to moderate foraminal stenosis bilaterally at L3-4.  Right leg is  hyperreflexic MRI without critical pathology Patient should follow-up outpatient with orthopedic surgery  Vitamin D deficiency Continue vitamin D supplementation  Constipation Continue MiraLAX, Senokot, Colace Lactulose PRN  Morbid obesity Estimated body mass index is 47.17 kg/m as calculated from the following:   Height as of this encounter: 5\' 11"  (1.803 m).   Weight as of this encounter: 153.4 kg. Dietitian consulted per patient request; outpatient dietitian follow-up Lifestyle modification advised     DVT prophylaxis: Lovenox Code Status:   Code Status: Full Code Family Communication: Wife at bedside. Disposition Plan: Discharge to SNF once bed is available and insurance auth is approved   Consultants:  Orthopedic surgery  Procedures:  None  Antimicrobials: None   Subjective: Denies any new complaints.     Objective: BP (!) 148/62 (BP Location: Right Arm)   Pulse 61   Temp 97.8 F (36.6 C) (Oral)   Resp 16   Ht 5\' 11"  (1.803 m)   Wt (!) 153.4 kg   SpO2 96%   BMI 47.17 kg/m    Examination: General: NAD  Cardiovascular: S1, S2 present Respiratory: CTAB Abdomen: Soft, nontender, nondistended, bowel sounds present Musculoskeletal: 2+ bilateral pedal edema noted, RLE in CAM boot Skin: Normal Psychiatry: Normal mood     Data Reviewed: I have personally reviewed following labs and imaging studies  CBC Lab Results  Component Value Date   WBC 10.2 01/11/2023   RBC 3.48 (L) 01/11/2023   HGB 10.4 (L) 01/11/2023   HCT 32.2 (L) 01/11/2023   MCV 92.5 01/11/2023   MCH 29.9 01/11/2023   PLT 354 01/11/2023   MCHC 32.3 01/11/2023   RDW 15.7 (H) 01/11/2023   LYMPHSABS 2.0 01/11/2023   MONOABS 0.9 01/11/2023   EOSABS 0.5 01/11/2023   BASOSABS 0.1 01/11/2023  Last metabolic panel Lab Results  Component Value Date   NA 136 01/11/2023   K 3.7 01/11/2023   CL 99 01/11/2023   CO2 27 01/11/2023   BUN 25 (H) 01/11/2023   CREATININE 1.38 (H)  01/11/2023   GLUCOSE 141 (H) 01/11/2023   GFRNONAA 59 (L) 01/11/2023   GFRAA >90 07/19/2014   CALCIUM 9.3 01/11/2023   PROT 7.2 12/31/2022   ALBUMIN 3.3 (L) 12/31/2022   BILITOT 0.7 12/31/2022   ALKPHOS 81 12/31/2022   AST 20 12/31/2022   ALT 28 12/31/2022   ANIONGAP 10 01/11/2023    GFR: Estimated Creatinine Clearance: 86.8 mL/min (A) (by C-G formula based on SCr of 1.38 mg/dL (H)).  No results found for this or any previous visit (from the past 240 hour(s)).    Radiology Studies: No results found.    LOS: 18 days    Briant Cedar, MD [facility] 01/11/2023, 5:05 PM   If 7PM-7AM, please contact night-coverage [email or web address]

## 2023-01-11 NOTE — Progress Notes (Signed)
   01/11/23 2238  BiPAP/CPAP/SIPAP  BiPAP/CPAP/SIPAP Pt Type Adult (Pt prefer self placement)  BiPAP/CPAP/SIPAP DREAMSTATIOND  Mask Type Full face mask  Mask Size Large  FiO2 (%) 21 %  Patient Home Equipment No  Auto Titrate Yes (imax 25 emin 6)  CPAP/SIPAP surface wiped down Yes

## 2023-01-12 DIAGNOSIS — R627 Adult failure to thrive: Secondary | ICD-10-CM | POA: Diagnosis not present

## 2023-01-12 LAB — GLUCOSE, CAPILLARY
Glucose-Capillary: 147 mg/dL — ABNORMAL HIGH (ref 70–99)
Glucose-Capillary: 199 mg/dL — ABNORMAL HIGH (ref 70–99)

## 2023-01-12 MED ORDER — METHOCARBAMOL 750 MG PO TABS: 750.0000 mg | ORAL_TABLET | Freq: Four times a day (QID) | ORAL | 0 refills | Status: AC | PRN

## 2023-01-12 MED ORDER — VITAMIN D3 25 MCG PO TABS: 1000.0000 [IU] | ORAL_TABLET | Freq: Every day | ORAL | Status: DC

## 2023-01-12 MED ORDER — DOCUSATE SODIUM 100 MG PO CAPS: 100.0000 mg | ORAL_CAPSULE | Freq: Two times a day (BID) | ORAL | Status: DC

## 2023-01-12 MED ORDER — AMLODIPINE BESYLATE 10 MG PO TABS: 10.0000 mg | ORAL_TABLET | Freq: Every day | ORAL | Status: DC

## 2023-01-12 MED ORDER — ALPRAZOLAM 0.5 MG PO TABS: 0.5000 mg | ORAL_TABLET | Freq: Every evening | ORAL | 0 refills | Status: AC | PRN

## 2023-01-12 MED ORDER — ALPRAZOLAM 0.5 MG PO TABS: 0.5000 mg | ORAL_TABLET | Freq: Every evening | ORAL | 0 refills | Status: DC | PRN

## 2023-01-12 MED ORDER — SENNOSIDES-DOCUSATE SODIUM 8.6-50 MG PO TABS: 1.0000 | ORAL_TABLET | Freq: Two times a day (BID) | ORAL | Status: DC

## 2023-01-12 MED ORDER — POLYETHYLENE GLYCOL 3350 17 G PO PACK: 17.0000 g | PACK | Freq: Two times a day (BID) | ORAL | Status: DC

## 2023-01-12 MED ORDER — OXYCODONE HCL 5 MG PO TABS: 5.0000 mg | ORAL_TABLET | Freq: Four times a day (QID) | ORAL | 0 refills | Status: DC | PRN

## 2023-01-12 NOTE — Discharge Summary (Signed)
Physician Discharge Summary   Patient: Raymond [name] MRN: [id number] DOB: 11-02-1965  Admit date:     12/24/2022  Discharge date: 01/12/23  Discharge Physician: Briant Cedar   PCP: Venetia Constable, MD   Recommendations at discharge:   Follow-up with PCP in 1 week Follow-up with orthopedics as scheduled  Discharge Diagnoses: Principal Problem:   Failure to thrive in adult Active Problems:   Edema of extremities   Type 2 diabetes mellitus, without long-term current use of insulin (HCC)   Tobacco abuse    Hospital Course: Remo Cofone Choe is a 57 y.o. male with a history of obesity, hypertension, CKD, tobacco use, diabetes mellitus. Patient presented secondary to poor functional status and worsening lower extremity edema. Patient seen by orthopedic surgery with recommendation for conservative management of right fibular fracture. Patient underwent diuresis for lower extremity edema. Patient evaluated by PT/OT with recommendation for SNF on discharge.   Today, patient denied any new complaints.  Stable to discharge to SNF for further rehab needs.  Follow-up with PCP with repeat labs, orthopedics as scheduled.   Assessment and Plan:  Anasarca Significant lower extremity edema Lower extremity venous duplex negative for DVT ABIs normal, complicated by movement during exam ECHO with normal LVEF with normal RV function, with technically difficult study with very limited visualization of cardiac structures S/p IV Lasix with good diuresis, switched to bumex Encourage TED hose Follow-up with PCP with repeat labs   Displaced right fibular fracture Slightly displaced fibular fracture noted on imaging in setting of recent fall Orthopedic surgery was consulted with recommendation for conservative management, weightbearing as tolerated in cam boot and is nonweightbearing to right lower extremity out of cam boot Follow-up with orthopedics for further recommendation   Diabetes  mellitus type 2 Continue home regimen with metformin and glipizide   Tobacco use Patient received cessation counseling this admission. Refused nicotine patch   Hypokalemia Likely related to IV diuresis.  Potassium repleted as needed.   Hypertension Continue losartan, amlodipine, doxazosin, coreg    CKD stage IIIa Baseline creatinine appears to be around 1.5   Right lower extremity numbness No associated back pain CT scan of lumbar spine significant for foraminal stenosis greatest at L4-5 with right greater than left in addition to moderate foraminal stenosis bilaterally at L3-4.  Right leg is hyperreflexic MRI without critical pathology Patient should follow-up outpatient with orthopedic surgery   Vitamin D deficiency Continue vitamin D supplementation   Constipation Continue MiraLAX, Senokot, Colace   Morbid obesity Estimated body mass index is 47.17 kg/m as calculated from the following:   Height as of this encounter: 5\' 11"  (1.803 m).   Weight as of this encounter: 153.4 kg. Dietitian consulted per patient request; outpatient dietitian follow-up Lifestyle modification advised       Pain control - [facility] Controlled Substance Reporting System database was reviewed. and patient was instructed, not to drive, operate heavy machinery, perform activities at heights, swimming or participation in water activities or provide baby-sitting services while on Pain, Sleep and Anxiety Medications; until their outpatient Physician has advised to do so again. Also recommended to not to take more than prescribed Pain, Sleep and Anxiety Medications.    Consultants: Orthopedics Procedures performed: None Disposition: Skilled nursing facility Diet recommendation:  Cardiac and Carb modified diet    DISCHARGE MEDICATION: Allergies as of 01/12/2023       Reactions   Sulfa Antibiotics Hives        Medication List  STOP taking these medications     amLODipine-benazepril 5-20 MG capsule Commonly known as: LOTREL   hydrOXYzine 25 MG tablet Commonly known as: ATARAX   lisinopril 20 MG tablet Commonly known as: ZESTRIL   meloxicam 15 MG tablet Commonly known as: MOBIC   predniSONE 20 MG tablet Commonly known as: DELTASONE       TAKE these medications    albuterol (2.5 MG/3ML) 0.083% nebulizer solution Commonly known as: PROVENTIL Inhale 3 mLs into the lungs every 4 (four) hours as needed for wheezing or shortness of breath.   allopurinol 300 MG tablet Commonly known as: ZYLOPRIM Take 300 mg by mouth daily.   ALPRAZolam 0.5 MG tablet Commonly known as: XANAX Take 1 tablet (0.5 mg total) by mouth at bedtime as needed for up to 10 days for anxiety.   amLODipine 10 MG tablet Commonly known as: NORVASC Take 1 tablet (10 mg total) by mouth daily. Start taking on: January 13, 2023   atorvastatin 40 MG tablet Commonly known as: LIPITOR Take 40 mg by mouth daily.   bumetanide 2 MG tablet Commonly known as: BUMEX Take 4 mg by mouth 2 (two) times daily.   carvedilol 25 MG tablet Commonly known as: COREG Take 25 mg by mouth 2 (two) times daily with a meal.   Cozaar 50 MG tablet Generic drug: losartan Take 50 mg by mouth daily.   docusate sodium 100 MG capsule Commonly known as: COLACE Take 1 capsule (100 mg total) by mouth 2 (two) times daily.   doxazosin 2 MG tablet Commonly known as: CARDURA Take 2 mg by mouth at bedtime.   DULoxetine 60 MG capsule Commonly known as: CYMBALTA Take 60 mg by mouth daily.   glipiZIDE 10 MG tablet Commonly known as: GLUCOTROL Take 10 mg by mouth daily before breakfast.   metFORMIN 500 MG 24 hr tablet Commonly known as: GLUCOPHAGE-XR Take 1,000 mg by mouth daily with breakfast.   methocarbamol 750 MG tablet Commonly known as: ROBAXIN Take 1 tablet (750 mg total) by mouth every 6 (six) hours as needed for up to 10 days for muscle spasms.   nystatin-triamcinolone  cream Commonly known as: MYCOLOG II Apply 1 application topically daily as needed (itch).   omeprazole 40 MG capsule Commonly known as: PRILOSEC Take 40 mg by mouth daily.   oxyCODONE 5 MG immediate release tablet Commonly known as: Roxicodone Take 1 tablet (5 mg total) by mouth every 6 (six) hours as needed for moderate pain.   polyethylene glycol 17 g packet Commonly known as: MIRALAX / GLYCOLAX Take 17 g by mouth 2 (two) times daily.   potassium chloride SA 20 MEQ tablet Commonly known as: KLOR-CON M Take 30 mEq by mouth 2 (two) times daily.   pramipexole 0.5 MG tablet Commonly known as: MIRAPEX Take 0.5 mg by mouth daily.   senna-docusate 8.6-50 MG tablet Commonly known as: Senokot-S Take 1 tablet by mouth 2 (two) times daily.   vitamin D3 25 MCG tablet Commonly known as: CHOLECALCIFEROL Take 1 tablet (1,000 Units total) by mouth daily. Start taking on: January 13, 2023        Follow-up Information     Venetia Constable, MD. Schedule an appointment as soon as possible for a visit in 1 week(s).   Specialty: Internal Medicine Contact information: [address] [place] [address] [phone]                Discharge Exam: Filed Weights   01/09/23 0520 01/10/23  1610 01/12/23 0713  Weight: (!) 153.4 kg (!) 153.4 kg (!) 155 kg   General: NAD, morbidly obese Cardiovascular: S1, S2 present Respiratory: CTAB Abdomen: Soft, nontender, nondistended, bowel sounds present Musculoskeletal: 2+ bilateral pedal edema noted Skin: Normal Psychiatry: Normal mood   Condition at discharge: stable  The results of significant diagnostics from this hospitalization (including imaging, microbiology, ancillary and laboratory) are listed below for reference.   Imaging Studies: VAS [place] ABI WITH/WO TBI  Result Date: 01/05/2023  LOWER EXTREMITY DOPPLER STUDY Patient Name:  Jeret Berl Donati  Date of Exam:   01/05/2023 Medical Rec #: [id number]    Accession #:     [id number] Date of Birth: January 14, 1966     Patient Gender: M Patient Age:   67 years Exam Location:  [facility] Procedure:      VAS [place] ABI WITH/WO TBI Referring Phys: RALPH NETTEY --------------------------------------------------------------------------------  Indications: Decreased pedal pulses High Risk Factors: Hypertension, hyperlipidemia, Diabetes, current smoker.  Limitations: Today's exam was limited due to Constant patient movement, leg              spasms and patient positioning. Comparison Study: No prior studies. Performing Technologist: Olen Cordial RVT  Examination Guidelines: A complete evaluation includes at minimum, Doppler waveform signals and systolic blood pressure reading at the level of bilateral brachial, anterior tibial, and posterior tibial arteries, when vessel segments are accessible. Bilateral testing is considered an integral part of a complete examination. Photoelectric Plethysmograph (PPG) waveforms and toe systolic pressure readings are included as required and additional duplex testing as needed. Limited examinations for reoccurring indications [place] be performed as noted.  ABI Findings: +---------+------------------+-----+---------+--------+ Right    Rt Pressure (mmHg)IndexWaveform Comment  +---------+------------------+-----+---------+--------+ Brachial 185                    triphasic         +---------+------------------+-----+---------+--------+ PTA      193               1.04 triphasic         +---------+------------------+-----+---------+--------+ DP       164               0.89 triphasic         +---------+------------------+-----+---------+--------+ Great Toe153               0.83                   +---------+------------------+-----+---------+--------+ +--------+------------------+-----+---------+-------+ Left    Lt Pressure (mmHg)IndexWaveform Comment +--------+------------------+-----+---------+-------+ [id number]                     triphasic        +--------+------------------+-----+---------+-------+ PTA     191               1.03 triphasic        +--------+------------------+-----+---------+-------+ DP      186               1.01 triphasic        +--------+------------------+-----+---------+-------+ +-------+-----------+-----------+------------+------------+ ABI/TBIToday's ABIToday's TBIPrevious ABIPrevious TBI +-------+-----------+-----------+------------+------------+ Right  1.04       0.83                                +-------+-----------+-----------+------------+------------+ Left   1.03                                           +-------+-----------+-----------+------------+------------+  Summary: Right: Resting right ankle-brachial index is within normal range. The right toe-brachial index is normal. ABI's are likely inaccurate due to patient movement/leg spasms. Left: Resting left ankle-brachial index is within normal range. Unable to obtain TBI due to patient movement/leg spasms. ABI's are likely inaccurate due to patient movement/leg spasms. *See table(s) above for measurements and observations.  Electronically signed by Gerarda Fraction on 01/05/2023 at 7:45:50 PM.    Final    MR LUMBAR SPINE WO CONTRAST  Result Date: 01/01/2023 CLINICAL DATA:  Initial evaluation for lumbar radiculopathy. Trauma. EXAM: MRI LUMBAR SPINE WITHOUT CONTRAST TECHNIQUE: Multiplanar, multisequence MR imaging of the lumbar spine was performed. No intravenous contrast was administered. COMPARISON:  Prior CT from 12/25/2022. FINDINGS: Segmentation:  Examination moderately degraded by motion. Standard segmentation. Lowest well-formed disc space labeled the L5-S1 level. Alignment: Vertebral bodies normally aligned with preservation of the normal lumbar lordosis. Trace degenerative retrolisthesis of L2 on L3. Vertebrae: Vertebral body height maintained without acute or chronic fracture. Bone marrow signal intensity within  normal limits. Small benign hemangiomata noted within the L2 and L3 vertebral bodies. No worrisome osseous lesions. No abnormal marrow edema. Conus medullaris and cauda equina: Conus extends to the T12-L1 level. Conus and cauda equina appear normal. Paraspinal and other soft tissues: Paraspinous soft tissues demonstrate no significant finding. Apparent mildly increased STIR signal intensity within the posterior paraspinous region noted, favored to be technical and/or artifactual on this exam. Disc levels: T12-L1: Disc desiccation with mild disc bulge. No canal or foraminal stenosis. L1-2: Disc desiccation without significant disc bulge. Mild facet hypertrophy. No stenosis. L2-3: Disc desiccation without significant disc bulge. Minimal facet spurring. No spinal stenosis. Foramina remain patent. L3-4: Disc desiccation with mild disc bulge. Mild bilateral facet hypertrophy. No significant spinal stenosis. Mild bilateral L3 foraminal narrowing. L4-5: Disc desiccation with diffuse disc bulge. Superimposed endplate Schmorl's node deformities with mild reactive endplate spurring noted. Superimposed left subarticular to foraminal disc protrusion (series 8, image 31). Mild to moderate bilateral facet hypertrophy. Associated trace joint effusions noted. Resultant mild right with moderate left lateral recess stenosis. Central canal remains patent. Moderate right with severe left L4 foraminal narrowing. L5-S1: Tiny central disc protrusion minimally indents the ventral thecal sac. Mild bilateral facet hypertrophy with associated trace joint effusions. No spinal stenosis. Mild bilateral L5 foraminal narrowing. IMPRESSION: 1. No acute abnormality within the lumbar spine. 2. Degenerative spondylosis with left subarticular to foraminal disc protrusion at L4-5, resulting in moderate left lateral recess stenosis, with severe left and moderate right L4 foraminal narrowing. 3. Additional mild noncompressive disc bulging and facet  hypertrophy at L3-4 and L5-S1 with resultant mild bilateral L3 and L5 foraminal stenosis. Electronically Signed   By: Rise Mu M.D.   On: 01/01/2023 06:31   CT THORACIC SPINE WO CONTRAST  Result Date: 12/25/2022 CLINICAL DATA:  Neck trauma.  Pain. EXAM: CT THORACIC SPINE WITHOUT CONTRAST TECHNIQUE: Multidetector CT images of the thoracic were obtained using the standard protocol without intravenous contrast. RADIATION DOSE REDUCTION: This exam was performed according to the departmental dose-optimization program which includes automated exposure control, adjustment of the mA and/or kV according to patient size and/or use of iterative reconstruction technique. COMPARISON:  Thoracic spine radiographs 12/24/2022 FINDINGS: Alignment: No significant listhesis is present. Straightening of the normal thoracic kyphosis is present. Mild rightward curvature is centered at T9-10. Vertebrae: No acute or healing fractures are present. Paraspinal and other soft tissues: Paraspinous soft tissues are within normal limits. The visualized lung fields are clear.  Disc levels: Chronic endplate changes are present at T9-10 and to lesser extent at T11-12. Vacuum disc is present at both these levels. IMPRESSION: 1. No acute fracture or traumatic subluxation. 2. Chronic endplate changes at T9-10 and to lesser extent at T11-12. 3. Mild rightward curvature of the thoracic spine is centered at T9-10. Electronically Signed   By: Marin Roberts M.D.   On: 12/25/2022 18:28   CT PELVIS WO CONTRAST  Result Date: 12/25/2022 CLINICAL DATA:  Pelvic pain EXAM: CT PELVIS WITHOUT CONTRAST TECHNIQUE: Multidetector CT imaging of the pelvis was performed following the standard protocol without intravenous contrast. RADIATION DOSE REDUCTION: This exam was performed according to the departmental dose-optimization program which includes automated exposure control, adjustment of the mA and/or kV according to patient size and/or use of  iterative reconstruction technique. COMPARISON:  None Available. FINDINGS: Urinary Tract:  No abnormality visualized. Bowel: There is sigmoid colon diverticulosis. No acute inflammation identified. Appendix is within normal limits. No dilated bowel loops or free air. Vascular/Lymphatic: There are mildly enlarged bilateral inguinal lymph nodes measuring up to 16 mm short axis. No enlarged pelvic lymph nodes are seen. Visualized vascular structures are within normal limits. Reproductive:  Prostate gland is within normal limits. Other: There are small fat containing bilateral inguinal hernias. There is no free fluid. Musculoskeletal: No acute osseous findings. No suspicious bony lesion. No soft tissue fluid collection or hematoma. There is vacuum disc phenomena at L4-L5, degenerative. IMPRESSION: 1. No acute localizing process in the pelvis. 2. Bilateral inguinal lymphadenopathy of uncertain etiology. 3. Sigmoid colon diverticulosis. 4. Small fat containing bilateral inguinal hernias. Electronically Signed   By: Darliss Cheney M.D.   On: 12/25/2022 18:28   CT LUMBAR SPINE WO CONTRAST  Result Date: 12/25/2022 CLINICAL DATA:  Back trauma. EXAM: CT LUMBAR SPINE WITHOUT CONTRAST TECHNIQUE: Multidetector CT imaging of the lumbar spine was performed without intravenous contrast administration. Multiplanar CT image reconstructions were also generated. RADIATION DOSE REDUCTION: This exam was performed according to the departmental dose-optimization program which includes automated exposure control, adjustment of the mA and/or kV according to patient size and/or use of iterative reconstruction technique. COMPARISON:  Lumbar spine radiographs 12/24/2022 FINDINGS: Segmentation: 5 non rib-bearing lumbar type vertebral bodies are present. The lowest fully formed vertebral body is L5. Alignment: No significant listhesis is present. Slight leftward curvature is centered at L3-4. Straightening of the normal lumbar lordosis is  present. Vertebrae: Schmorl's nodes are present at L4-5 with chronic endplate changes. Vertebral body heights are otherwise normal. No other focal osseous lesions are present. No acute or healing fractures are present. Paraspinal and other soft tissues: Mid atherosclerotic changes are present in the aorta branch vessels. No aneurysm is present. No adenopathy is present. Disc levels: Vacuum disc is present at L3-4 and L4-5. Foraminal stenosis is greatest at L4-5, right greater than left. Moderate foraminal stenosis is present bilaterally at L3-4 as well, left greater than right. More mild foraminal narrowing is present bilaterally at L5-S1. IMPRESSION: 1. No acute fracture or traumatic subluxation. 2. Foraminal stenosis is greatest at L4-5, right greater than left. 3. Moderate foraminal stenosis bilaterally at L3-4 as well, left greater than right. 4. More mild foraminal narrowing bilaterally at L5-S1. 5.  Aortic Atherosclerosis (ICD10-I70.0). Electronically Signed   By: Marin Roberts M.D.   On: 12/25/2022 18:24   VAS [place] LOWER EXTREMITY VENOUS (DVT)  Result Date: 12/25/2022  Lower Venous DVT Study Patient Name:  Juandavid Salena Saner [place]  Date of Exam:   12/25/2022  Medical Rec #: [id number]    Accession #:    [id number] Date of Birth: [birth year]     Patient Gender: M Patient Age:   39 years Exam Location:  [facility] Procedure:      VAS [place] LOWER EXTREMITY VENOUS (DVT) Referring Phys: [facility] --------------------------------------------------------------------------------  Indications: Bilateral lower extremity edema, acute on chronic, s/p falls and right ankle fracture.  Limitations: Poor ultrasound/tissue interface, body habitus and involuntary patient movement. Comparison Study: 07-09-2021 Bilateral lower extremity venous study was negative                   for DVT. Performing Technologist: Jean Rosenthal RDMS, RVT  Examination Guidelines: A complete evaluation includes B-mode imaging, spectral Doppler, color  Doppler, and power Doppler as needed of all accessible portions of each vessel. Bilateral testing is considered an integral part of a complete examination. Limited examinations for reoccurring indications [place] be performed as noted. The reflux portion of the exam is performed with the patient in reverse Trendelenburg.  +---------+---------------+---------+-----------+----------+-------------------+ RIGHT    CompressibilityPhasicitySpontaneityPropertiesThrombus Aging      +---------+---------------+---------+-----------+----------+-------------------+ CFV      Full           Yes      Yes                                      +---------+---------------+---------+-----------+----------+-------------------+ SFJ      Full                                                             +---------+---------------+---------+-----------+----------+-------------------+ FV Prox  Full                                                             +---------+---------------+---------+-----------+----------+-------------------+ FV Mid   Full                                                             +---------+---------------+---------+-----------+----------+-------------------+ FV Distal               Yes      Yes                                      +---------+---------------+---------+-----------+----------+-------------------+ PFV      Full                                                             +---------+---------------+---------+-----------+----------+-------------------+ POP      Full           Yes  Yes                                      +---------+---------------+---------+-----------+----------+-------------------+ PTV      Full                                                             +---------+---------------+---------+-----------+----------+-------------------+ PERO                                                  Not well visualized  +---------+---------------+---------+-----------+----------+-------------------+   +---------+---------------+---------+-----------+----------+--------------+ LEFT     CompressibilityPhasicitySpontaneityPropertiesThrombus Aging +---------+---------------+---------+-----------+----------+--------------+ CFV      Full           Yes      Yes                                 +---------+---------------+---------+-----------+----------+--------------+ SFJ      Full                                                        +---------+---------------+---------+-----------+----------+--------------+ FV Prox  Full                                                        +---------+---------------+---------+-----------+----------+--------------+ FV Mid   Full                                                        +---------+---------------+---------+-----------+----------+--------------+ FV DistalFull                                                        +---------+---------------+---------+-----------+----------+--------------+ PFV      Full                                                        +---------+---------------+---------+-----------+----------+--------------+ POP      Full           Yes      Yes                                 +---------+---------------+---------+-----------+----------+--------------+ PTV      Full                                                        +---------+---------------+---------+-----------+----------+--------------+  PERO     Full                                                        +---------+---------------+---------+-----------+----------+--------------+     Summary: RIGHT: - There is no evidence of deep vein thrombosis in the lower extremity. However, portions of this examination were limited- see technologist comments above.  - A heterogenous collection is found in the popliteal fossa.  LEFT: - There is no evidence of  deep vein thrombosis in the lower extremity.  - No cystic structure found in the popliteal fossa.  *See table(s) above for measurements and observations. Electronically signed by Gerarda Fraction on 12/25/2022 at 6:10:51 PM.    Final    ECHOCARDIOGRAM COMPLETE  Result Date: 12/25/2022    ECHOCARDIOGRAM REPORT   Patient Name:   Latrelle Salena Saner [place] Date of Exam: 12/25/2022 Medical Rec #:  [id number]   Height:       71.0 in Accession #:    [id number]  Weight:       345.0 lb Date of Birth:  1965-08-12    BSA:          2.658 m Patient Age:    57 years    BP:           171/94 mmHg Patient Gender: M           HR:           100 bpm. Exam Location:  Inpatient Procedure: 2D Echo, Cardiac Doppler, Color Doppler and Intracardiac            Opacification Agent Indications:    I50.21 Acute systolic (congestive) heart failure  History:        Patient has prior history of Echocardiogram examinations, most                 recent 10/19/2014. COPD; Risk Factors:Diabetes, Dyslipidemia and                 Hypertension.  Sonographer:    Harriette Bouillon RDCS Referring Phys: [id number] [facility] GOEL IMPRESSIONS  1. Technically difficult study with very limited visualization of cardiac structures.  2. Left ventricular ejection fraction, by estimation, is 55 to 60%. The left ventricle has normal function. The left ventricle has no regional wall motion abnormalities. Left ventricular diastolic parameters were normal.  3. Right ventricular systolic function is normal. The right ventricular size is normal.  4. The mitral valve was not well visualized. No evidence of mitral valve regurgitation. No evidence of mitral stenosis.  5. The aortic valve was not well visualized. Aortic valve regurgitation is not visualized. No aortic stenosis is present.  6. The inferior vena cava is dilated in size with <50% respiratory variability, suggesting right atrial pressure of 15 mmHg. FINDINGS  Left Ventricle: Left ventricular ejection fraction, by estimation, is 55 to 60%. The  left ventricle has normal function. The left ventricle has no regional wall motion abnormalities. Definity contrast agent was given IV to delineate the left ventricular  endocardial borders. The left ventricular internal cavity size was normal in size. There is no left ventricular hypertrophy. Left ventricular diastolic parameters were normal. Right Ventricle: The right ventricular size is normal. No increase in right ventricular wall thickness. Right ventricular systolic function is normal. Left Atrium: Left atrial size was normal  in size. Right Atrium: Right atrial size was normal in size. Pericardium: There is no evidence of pericardial effusion. Mitral Valve: The mitral valve was not well visualized. No evidence of mitral valve regurgitation. No evidence of mitral valve stenosis. Tricuspid Valve: The tricuspid valve is not well visualized. Tricuspid valve regurgitation is not demonstrated. No evidence of tricuspid stenosis. Aortic Valve: The aortic valve was not well visualized. Aortic valve regurgitation is not visualized. No aortic stenosis is present. Pulmonic Valve: The pulmonic valve was not assessed. Pulmonic valve regurgitation is not visualized. No evidence of pulmonic stenosis. Aorta: The aortic root is normal in size and structure. Venous: The inferior vena cava is dilated in size with less than 50% respiratory variability, suggesting right atrial pressure of 15 mmHg. IAS/Shunts: No atrial level shunt detected by color flow Doppler.  LEFT VENTRICLE PLAX 2D LVIDd:         6.10 cm LVIDs:         4.90 cm LV PW:         1.10 cm LV IVS:        1.10 cm LVOT diam:     2.30 cm LV SV:         106 LV SV Index:   40 LVOT Area:     4.15 cm  IVC IVC diam: 3.00 cm LEFT ATRIUM              Index        RIGHT ATRIUM           Index LA diam:        4.60 cm  1.73 cm/m   RA Area:     12.50 cm LA Vol (A2C):   75.8 ml  28.51 ml/m  RA Volume:   25.90 ml  9.74 ml/m LA Vol (A4C):   112.0 ml 42.13 ml/m LA Biplane Vol:  97.3 ml  36.60 ml/m  AORTIC VALVE LVOT Vmax:   124.00 cm/s LVOT Vmean:  78.200 cm/s LVOT VTI:    0.256 m  AORTA Ao Root diam: 3.10 cm Ao Asc diam:  3.00 cm  SHUNTS Systemic VTI:  0.26 m Systemic Diam: 2.30 cm Aditya Sabharwal Electronically signed by Dorthula Nettles Signature Date/Time: 12/25/2022/3:43:25 PM    Final    CT HEAD WO CONTRAST ( )  Result Date: 12/25/2022 CLINICAL DATA:  Head trauma, abnormal mental status (Age 74-64y) EXAM: CT HEAD WITHOUT CONTRAST TECHNIQUE: Contiguous axial images were obtained from the base of the skull through the vertex without intravenous contrast. RADIATION DOSE REDUCTION: This exam was performed according to the departmental dose-optimization program which includes automated exposure control, adjustment of the mA and/or kV according to patient size and/or use of iterative reconstruction technique. COMPARISON:  None Available. FINDINGS: Brain: No evidence of acute infarction, hemorrhage, hydrocephalus, extra-axial collection or mass lesion/mass effect. Vascular: No hyperdense vessel. Skull: No acute fracture. Sinuses/Orbits: Moderate left mastoid effusion. Otherwise, clear sinuses. No acute findings. Other: No mastoid effusions. IMPRESSION: No evidence of acute intracranial abnormality. Electronically Signed   By: Feliberto Harts M.D.   On: 12/25/2022 15:37   DG FEMUR PORT, MIN 2 VIEWS RIGHT  Result Date: 12/24/2022 CLINICAL DATA:  Trauma, frequent falls EXAM: RIGHT FEMUR PORTABLE 2 VIEW COMPARISON:  None Available. FINDINGS: No recent displaced fracture is seen in right femur. Degenerative changes are noted in right knee. Effusion is seen in suprapatellar bursa in the right knee. IMPRESSION: No recent fracture or dislocation is seen in right femur. Degenerative changes and  effusion are noted in right knee. Electronically Signed   By: Ernie Avena M.D.   On: 12/24/2022 20:11   DG Foot 2 Views Right  Result Date: 12/24/2022 CLINICAL DATA:  Trauma, frequent  falls EXAM: RIGHT FOOT - 2 VIEW COMPARISON:  None Available. FINDINGS: Fracture is seen distal fibula. No other definite recent fracture is noted. Deformity in the shaft of fifth metatarsal suggests old healed fracture. There is soft tissue swelling over the dorsum. IMPRESSION: Fracture is seen in distal right fibula. No other recent fractures are seen. Old healed fracture is seen in right fifth metatarsal. Electronically Signed   By: Ernie Avena M.D.   On: 12/24/2022 20:10   DG KNEE 3 VIEW RIGHT  Result Date: 12/24/2022 CLINICAL DATA:  Trauma, frequent falls EXAM: RIGHT KNEE - 3 VIEW COMPARISON:  None Available. FINDINGS: No recent fracture or dislocation is seen. Marked degenerative changes are noted with bony spurs in medial, lateral and patellofemoral compartments. Medial tibial plateau is slightly lower in comparison to the lateral tibial plateau. Sclerotic changes are noted in the medial tibial plateau. There is no demonstrable break in the cortical margins. There is soft tissue fullness in suprapatellar bursa suggesting effusion. There is edema in subcutaneous plane. IMPRESSION: No definite recent fracture or dislocation is seen. Medial tibial plateau shows sclerosis and is slightly lower in position in relation to the lateral tibial plateau. These changes Michelsen be due to previous injury and degenerative arthritis. If there are continued symptoms, follow-up CT Ebright be considered. Possible moderate effusion is seen in suprapatellar bursa. Electronically Signed   By: Ernie Avena M.D.   On: 12/24/2022 20:08   DG Tibia/Fibula Right  Result Date: 12/24/2022 CLINICAL DATA:  Trauma, frequent falls EXAM: RIGHT TIBIA AND FIBULA - 2 VIEW COMPARISON:  None Available. FINDINGS: There is oblique fracture in the distal metaphyseal region and distal shaft of right fibula. There is 3 mm offset in alignment of lateral cortical margins of the fracture the no other fracture is seen. Degenerative changes are  noted in right knee. There is diffuse edema in subcutaneous plane in right lower leg. IMPRESSION: Recent, slightly displaced fracture is seen in the distal right fibula. Electronically Signed   By: Ernie Avena M.D.   On: 12/24/2022 20:05   DG Thoracic Spine 2 View  Result Date: 12/24/2022 CLINICAL DATA:  Frequent falls EXAM: THORACIC SPINE 2 VIEWS COMPARISON:  None Available. FINDINGS: Lateral view is technically less than optimal, possibly due to patient's body habitus. In the AP view, there is no definite decrease in height of thoracic vertebral bodies. Degenerative changes are noted with disc space narrowing and bony spurs in lower thoracic spine. Paraspinal soft tissues are unremarkable. IMPRESSION: Technically limited study.  No definite recent fracture is seen. Electronically Signed   By: Ernie Avena M.D.   On: 12/24/2022 20:03   DG Lumbar Spine 2-3 Views  Result Date: 12/24/2022 CLINICAL DATA:  Frequent falls EXAM: LUMBAR SPINE - 2-3 VIEW COMPARISON:  None Available. FINDINGS: Examination is technically difficult due to patient's body habitus. Lateral views are less than optimal. As far as seen, no definite recent fracture is seen. Alignment of posterior margins of vertebral bodies appears intact. Paraspinal soft tissues are unremarkable. IMPRESSION: Technically limited study.  No definite recent fracture is seen. Electronically Signed   By: Ernie Avena M.D.   On: 12/24/2022 20:02   DG Ankle 2 Views Right  Result Date: 12/21/2022 CLINICAL DATA:  Ankle pain. EXAM: RIGHT ANKLE -  2 VIEW COMPARISON:  None Available. FINDINGS: There is an acute fracture of the lateral malleolus. No ankle joint dislocation. There is associated soft tissue swelling. IMPRESSION: Acute fracture of the lateral malleolus (Weber type b). Electronically Signed   By: Romona Curls M.D.   On: 12/21/2022 15:47   DG Knee 2 Views Right  Result Date: 12/21/2022 CLINICAL DATA:  Ankle pain and knee pain.  EXAM: RIGHT KNEE - 1-2 VIEW COMPARISON:  None Available. FINDINGS: No evidence of fracture or dislocation. There is a moderate to large knee joint effusion. Severe tricompartmental osteoarthritis is noted. No radiopaque foreign body. There is soft tissue swelling around the knee. IMPRESSION: Moderate to large knee joint effusion. No acute osseous injury. Electronically Signed   By: Romona Curls M.D.   On: 12/21/2022 15:46    Microbiology: Results for orders placed or performed during the hospital encounter of 07/09/21  Resp Panel by RT-PCR (Flu A&B, Covid) Nasopharyngeal Swab     Status: None   Collection Time: 07/09/21  6:35 PM   Specimen: Nasopharyngeal Swab; Nasopharyngeal(NP) swabs in vial transport medium  Result Value Ref Range Status   SARS Coronavirus 2 by RT PCR NEGATIVE NEGATIVE Final    Comment: (NOTE) SARS-CoV-2 target nucleic acids are NOT DETECTED.  The SARS-CoV-2 RNA is generally detectable in upper respiratory specimens during the acute phase of infection. The lowest concentration of SARS-CoV-2 viral copies this assay can detect is 138 copies/mL. A negative result does not preclude SARS-Cov-2 infection and should not be used as the sole basis for treatment or other patient management decisions. A negative result Klayman occur with  improper specimen collection/handling, submission of specimen other than nasopharyngeal swab, presence of viral mutation(s) within the areas targeted by this assay, and inadequate number of viral copies(<138 copies/mL). A negative result must be combined with clinical observations, patient history, and epidemiological information. The expected result is Negative.  Fact Sheet for Patients:  BloggerCourse.com  Fact Sheet for Healthcare Providers:  SeriousBroker.it  This test is no t yet approved or cleared by the [place] FDA and  has been authorized for detection and/or diagnosis of  SARS-CoV-2 by FDA under an Emergency Use Authorization (EUA). This EUA will remain  in effect (meaning this test can be used) for the duration of the COVID-19 declaration under Section 564(b)(1) of the Act, 21 U.S.C.section 360bbb-3(b)(1), unless the authorization is terminated  or revoked sooner.       Influenza A by PCR NEGATIVE NEGATIVE Final   Influenza B by PCR NEGATIVE NEGATIVE Final    Comment: (NOTE) The Xpert Xpress SARS-CoV-2/FLU/RSV plus assay is intended as an aid in the diagnosis of influenza from Nasopharyngeal swab specimens and should not be used as a sole basis for treatment. Nasal washings and aspirates are unacceptable for Xpert Xpress SARS-CoV-2/FLU/RSV testing.  Fact Sheet for Patients: BloggerCourse.com  Fact Sheet for Healthcare Providers: SeriousBroker.it  This test is not yet approved or cleared by the [place] FDA and has been authorized for detection and/or diagnosis of SARS-CoV-2 by FDA under an Emergency Use Authorization (EUA). This EUA will remain in effect (meaning this test can be used) for the duration of the COVID-19 declaration under Section 564(b)(1) of the Act, 21 U.S.C. section 360bbb-3(b)(1), unless the authorization is terminated or revoked.  Performed at [facility], [address] [place], [place] [address]     Labs: CBC: Recent Labs  Lab 01/08/23 [phone] 01/09/23 0625 01/10/23 0636 01/11/23 0618  WBC  10.1 10.4 10.0 10.2  NEUTROABS 6.9 7.1 6.5 6.6  HGB 10.0* 9.7* 9.9* 10.4*  HCT 31.4* 30.6* 31.7* 32.2*  MCV 93.5 93.6 94.1 92.5  PLT 355 362 350 354   Basic Metabolic Panel: Recent Labs  Lab 01/08/23 0707 01/09/23 0625 01/10/23 0636 01/11/23 0618 01/12/23 0633  NA 137 137 137 136 134*  K 3.1* 3.7 3.4* 3.7 3.6  CL 100 100 99 99 98  CO2 27 25 28 27 28   GLUCOSE 169* 132* 142* 141* 143*  BUN 23* 21* 26* 25* 29*  CREATININE 1.32* 1.26* 1.50* 1.38* 1.53*   CALCIUM 9.1 9.0 8.9 9.3 9.0   Liver Function Tests: No results for input(s): "AST", "ALT", "ALKPHOS", "BILITOT", "PROT", "ALBUMIN" in the last 168 hours. CBG: Recent Labs  Lab 01/11/23 0732 01/11/23 1142 01/11/23 1633 01/11/23 2047 01/12/23 0718  GLUCAP 136* 211* 141* 149* 147*    Discharge time spent: greater than 30 minutes.  Signed: Briant Cedar, MD [facility] 01/12/2023

## 2023-01-12 NOTE — TOC Progression Note (Signed)
Transition of Care [facility]) - Progression Note    Patient Details  Name: Raymond [name] MRN: [id number] Date of Birth: 1966/04/19  Transition of Care [facility]) CM/SW Contact  Beckie Busing, RN Phone Number:[phone]  01/12/2023, 10:34 AM  Clinical Narrative:    Patient has insurance auth.    Expected Discharge Plan: Skilled Nursing Facility Barriers to Discharge: Continued Medical Work up  Expected Discharge Plan and Services                                               Social Determinants of Health (SDOH) Interventions SDOH Screenings   Food Insecurity: No Food Insecurity (12/24/2022)  Housing: Low Risk  (12/24/2022)  Transportation Needs: No Transportation Needs (12/24/2022)  Utilities: Not At Risk (12/24/2022)  Social Connections: Unknown (01/10/2022)   Received from [facility], [facility]  Stress: No Stress Concern Present (03/21/2022)   Received from [facility], [facility]  Tobacco Use: High Risk (12/24/2022)    Readmission Risk Interventions    12/25/2022    4:24 PM  Readmission Risk Prevention Plan  Post Dischage Appt Complete  Medication Screening Complete  Transportation Screening Complete

## 2023-01-12 NOTE — TOC Transition Note (Addendum)
Transition of Care [facility]) - CM/SW Discharge Note   Patient Details  Name: Raymond [name] MRN: [id number] Date of Birth: 07-12-1965  Transition [facility]) CM/SW Contact:  Beckie Busing, RN Phone Number: 01/12/2023, 11:08 AM   Clinical Narrative:    Per Alphonzo Lemmings patient can be discharged to [facility]. Patient has been updated on plan. Transportation has been arranged per PTAR. No other needs noted TOC will sign off.    Please call report to  Faythe Casa  [phone] Room # 146     Barriers to Discharge: Continued Medical Work up   Patient Goals and CMS Choice      Discharge Placement                         Discharge Plan and Services Additional resources added to the After Visit Summary for                                       Social Determinants of Health (SDOH) Interventions SDOH Screenings   Food Insecurity: No Food Insecurity (12/24/2022)  Housing: Low Risk  (12/24/2022)  Transportation Needs: No Transportation Needs (12/24/2022)  Utilities: Not At Risk (12/24/2022)  Social Connections: Unknown (01/10/2022)   Received from [facility], [facility]  Stress: No Stress Concern Present (03/21/2022)   Received from [facility], [facility]  Tobacco Use: High Risk (12/24/2022)     Readmission Risk Interventions    12/25/2022    4:24 PM  Readmission Risk Prevention Plan  Post Dischage Appt Complete  Medication Screening Complete  Transportation Screening Complete

## 2023-01-12 NOTE — Progress Notes (Signed)
[facility] transferring Raymond [name] to [facility]. Discharge packet sent with him. Report called to [place]. Wife at bedside.

## 2023-01-12 NOTE — Progress Notes (Signed)
Physical Therapy Treatment Patient Details Name: Raymond [name] MRN: [id number] DOB: 1966/04/14 Today's Date: 01/12/2023   History of Present Illness 57 yo male presents to therapy following hospital admission on 12/24/2022 due to progressive R LE pain and B LE weakness and edema impacting functional mobility. Pt sustained several recent falls and once of which resulted in R lateral malleolus fx 12/06/2022 pt presented to ED and d/c home with brace. Pt had follow up OP with orthopedic 7/14 and continued recommendation for conservative management and R LE WBAT with CAM boot donned for 6-8 weeks. Pt subsequently fell again on 7/21 resulting in increased LE pain and edema and presented to ED no acute fx and d/c home. Pt returned to ED on 7/24 indicating he has been bed bound since 7/21 due to inability to WB through R LE with worsening B knee pain R > L.and new onset of LE weakness. Pt R lateral malleolar fx is now slightly displaced distal fibular fx and orthopedics consulted with ongoing recommendation for conservative management. DVT negative. Pt also found to have anasarca with B LE edema and suspect for cellulitis with wound weeping. CT of head and spine negative for acute findings. Pt has PMH of DM II, tobacco abuse, COPD, gout, HLD, HTN, OSA on CPAP.    PT Comments   Pt admitted with above diagnosis.  Pt currently with functional limitations due to the deficits listed below (see PT Problem List). Pt seated EOB when PT arrived. Pt agreeable to therapy intervention. Pt states he is going to d/c from the hospital one way or another today. PT anticipates d/c to SNF and pt will benefit from continued inpatient follow up therapy, <3 hours/day. Pt able to progress with sit to stand from EOB to RW with CGA for 2/4 bouts and improved standing tolerance and balance with B UE support at RW and CGA. R CAM boot donned and L standard shoe. Progression with pregait tasks including weight shifting, anterior/posterior stepping  patterns and side step to R and L with min A, RW and cues. Pt continues to report R knee pain > R ankle pain. No apparent R hamstring cramps today. Pt left seated EOB and all needs in place with nurse entering room. Pt will benefit from acute skilled PT to increase their independence and safety with mobility to allow discharge.      If plan is discharge home, recommend the following: Two people to help with walking and/or transfers;A lot of help with bathing/dressing/bathroom;Assistance with cooking/housework;Assist for transportation   Can travel by private vehicle        Equipment Recommendations  None recommended by PT (pt reports DME in home setting)    Recommendations for Other Services       Precautions / Restrictions Precautions Precautions: Fall Required Braces or Orthoses: Other Brace Other Brace: R CAM boot Restrictions Weight Bearing Restrictions: No RLE Weight Bearing: Weight bearing as tolerated Other Position/Activity Restrictions: RLE WBAT with CAM boot donned, R ankle ROM as tolerated     Mobility  Bed Mobility Overal bed mobility: Modified Independent Bed Mobility: Supine to Sit, Sit to Supine           General bed mobility comments: pt seated EOB when PT arrived    Transfers Overall transfer level: Needs assistance Equipment used: Rolling walker (2 wheels) Transfers: Sit to/from Stand Sit to Stand: From elevated surface, Contact guard assist           General transfer comment: 4 standing bouts  with min A to CGA for pull to stand to RW. Pt standing tolerance 42s, 1:51, 1:24 and 1:17. PT able to engage pt with pregait tasks with weight shifting and anteror/posterior stepping patterns limited to one trial per LE and side stepping to the R with increased ease and trials to the L    Ambulation/Gait Ambulation/Gait assistance: Contact guard assist Gait Distance (Feet): 2 Feet Assistive device: Rolling walker (2 wheels) Gait Pattern/deviations: Step-to  pattern, Knees buckling, Trunk flexed (side step only L and R)       General Gait Details: pt limited today with gait trials due to pain   Stairs             Wheelchair Mobility     Tilt Bed    Modified Rankin (Stroke Patients Only)       Balance Overall balance assessment: History of Falls, Needs assistance Sitting-balance support: Feet supported Sitting balance-Leahy Scale: Good     Standing balance support: Bilateral upper extremity supported, During functional activity, Reliant on assistive device for balance Standing balance-Leahy Scale: Poor Standing balance comment: improved static standing extension posture and increased IND with CGA                            Cognition Arousal: Alert Behavior During Therapy: WFL for tasks assessed/performed Overall Cognitive Status: Within Functional Limits for tasks assessed                                          Exercises      General Comments        Pertinent Vitals/Pain Pain Assessment Pain Assessment: 0-10 Pain Score: 7  Pain Location: B LEs, R knee > R ankle Pain Descriptors / Indicators: Cramping, Constant, Discomfort Pain Intervention(s): Limited activity within patient's tolerance, Monitored during session, Repositioned    Home Living                          Prior Function            PT Goals (current goals can now be found in the care plan section) Acute Rehab PT Goals 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 PT Goal Formulation: With patient Time For Goal Achievement: 01/16/23 Potential to Achieve Goals: Good Progress towards PT goals: Progressing toward goals    Frequency    Min 1X/week      PT Plan Current plan remains appropriate    Co-evaluation              AM-PAC PT "6 Clicks" Mobility   Outcome Measure  Help needed turning from your back to your side while in a flat bed without using bedrails?:  None Help needed moving from lying on your back to sitting on the side of a flat bed without using bedrails?: None Help needed moving to and from a bed to a chair (including a wheelchair)?: A Lot Help needed standing up from a chair using your arms (e.g., wheelchair or bedside chair)?: A Lot Help needed to walk in hospital room?: Total Help needed climbing 3-5 steps with a railing? : Total 6 Click Score: 14    End of Session Equipment Utilized During Treatment: Gait belt Activity Tolerance: Patient limited by fatigue;Patient limited by pain Patient left: in bed;with call  bell/phone within reach Nurse Communication: Mobility status PT Visit Diagnosis: Unsteadiness on feet (R26.81);Other abnormalities of gait and mobility (R26.89);Repeated falls (R29.6);Difficulty in walking, not elsewhere classified (R26.2);Pain Pain - Right/Left:  (B) Pain - part of body: Knee;Leg     Time: 8657-8469 PT Time Calculation (min) (ACUTE ONLY): 23 min  Charges:    $Therapeutic Activity: 23-37 mins PT General Charges $$ ACUTE PT VISIT: 1 Visit                     Johnny Bridge, PT Acute Rehab    Jacqualyn Posey 01/12/2023, 11:27 AM

## 2023-02-11 ENCOUNTER — Ambulatory Visit: Payer: Managed Care, Other (non HMO) | Attending: Orthopedic Surgery

## 2023-03-11 ENCOUNTER — Emergency Department (HOSPITAL_COMMUNITY): Payer: Managed Care, Other (non HMO)

## 2023-03-11 ENCOUNTER — Inpatient Hospital Stay (HOSPITAL_COMMUNITY)
Admission: EM | Admit: 2023-03-11 | Discharge: 2023-03-23 | DRG: 519 | Disposition: A | Discharge status: Rehabilitation Facility | Payer: Managed Care, Other (non HMO) | Attending: Neurosurgery | Admitting: Neurosurgery

## 2023-03-11 ENCOUNTER — Encounter (HOSPITAL_COMMUNITY): Payer: Self-pay

## 2023-03-11 ENCOUNTER — Other Ambulatory Visit: Payer: Self-pay

## 2023-03-11 DIAGNOSIS — R531 Weakness: Secondary | ICD-10-CM

## 2023-03-11 DIAGNOSIS — E785 Hyperlipidemia, unspecified: Secondary | ICD-10-CM | POA: Diagnosis present

## 2023-03-11 DIAGNOSIS — Z791 Long term (current) use of non-steroidal anti-inflammatories (NSAID): Secondary | ICD-10-CM

## 2023-03-11 DIAGNOSIS — W19XXXA Unspecified fall, initial encounter: Secondary | ICD-10-CM | POA: Diagnosis present

## 2023-03-11 DIAGNOSIS — F419 Anxiety disorder, unspecified: Secondary | ICD-10-CM | POA: Diagnosis present

## 2023-03-11 DIAGNOSIS — E119 Type 2 diabetes mellitus without complications: Secondary | ICD-10-CM | POA: Diagnosis present

## 2023-03-11 DIAGNOSIS — Z833 Family history of diabetes mellitus: Secondary | ICD-10-CM

## 2023-03-11 DIAGNOSIS — E876 Hypokalemia: Secondary | ICD-10-CM | POA: Diagnosis present

## 2023-03-11 DIAGNOSIS — J449 Chronic obstructive pulmonary disease, unspecified: Secondary | ICD-10-CM | POA: Diagnosis present

## 2023-03-11 DIAGNOSIS — G4730 Sleep apnea, unspecified: Secondary | ICD-10-CM | POA: Diagnosis present

## 2023-03-11 DIAGNOSIS — F17210 Nicotine dependence, cigarettes, uncomplicated: Secondary | ICD-10-CM | POA: Diagnosis present

## 2023-03-11 DIAGNOSIS — Z7985 Long-term (current) use of injectable non-insulin antidiabetic drugs: Secondary | ICD-10-CM

## 2023-03-11 DIAGNOSIS — Z6841 Body Mass Index (BMI) 40.0 and over, adult: Secondary | ICD-10-CM

## 2023-03-11 DIAGNOSIS — Z993 Dependence on wheelchair: Secondary | ICD-10-CM

## 2023-03-11 DIAGNOSIS — M5441 Lumbago with sciatica, right side: Principal | ICD-10-CM

## 2023-03-11 DIAGNOSIS — I10 Essential (primary) hypertension: Secondary | ICD-10-CM | POA: Diagnosis present

## 2023-03-11 DIAGNOSIS — R32 Unspecified urinary incontinence: Secondary | ICD-10-CM

## 2023-03-11 DIAGNOSIS — M5104 Intervertebral disc disorders with myelopathy, thoracic region: Secondary | ICD-10-CM | POA: Diagnosis not present

## 2023-03-11 DIAGNOSIS — K219 Gastro-esophageal reflux disease without esophagitis: Secondary | ICD-10-CM | POA: Diagnosis present

## 2023-03-11 DIAGNOSIS — M5442 Lumbago with sciatica, left side: Principal | ICD-10-CM | POA: Diagnosis present

## 2023-03-11 DIAGNOSIS — Z7984 Long term (current) use of oral hypoglycemic drugs: Secondary | ICD-10-CM

## 2023-03-11 DIAGNOSIS — Z8249 Family history of ischemic heart disease and other diseases of the circulatory system: Secondary | ICD-10-CM

## 2023-03-11 DIAGNOSIS — D72829 Elevated white blood cell count, unspecified: Secondary | ICD-10-CM | POA: Diagnosis present

## 2023-03-11 DIAGNOSIS — Z79899 Other long term (current) drug therapy: Secondary | ICD-10-CM

## 2023-03-11 DIAGNOSIS — G4733 Obstructive sleep apnea (adult) (pediatric): Secondary | ICD-10-CM | POA: Diagnosis present

## 2023-03-11 DIAGNOSIS — M109 Gout, unspecified: Secondary | ICD-10-CM | POA: Diagnosis present

## 2023-03-11 DIAGNOSIS — R159 Full incontinence of feces: Secondary | ICD-10-CM

## 2023-03-11 DIAGNOSIS — Z882 Allergy status to sulfonamides status: Secondary | ICD-10-CM

## 2023-03-11 HISTORY — DX: Diverticulosis of intestine, part unspecified, without perforation or abscess without bleeding: K57.90

## 2023-03-11 HISTORY — DX: Gastrointestinal hemorrhage, unspecified: K92.2

## 2023-03-11 HISTORY — DX: Iron deficiency anemia, unspecified: D50.9

## 2023-03-11 LAB — BASIC METABOLIC PANEL
Anion gap: 6 (ref 5–15)
BUN: 40 mg/dL — ABNORMAL HIGH (ref 6–20)
CO2: 27 mmol/L (ref 22–32)
Calcium: 9.1 mg/dL (ref 8.9–10.3)
Chloride: 107 mmol/L (ref 98–111)
Creatinine, Ser: 1.63 mg/dL — ABNORMAL HIGH (ref 0.61–1.24)
GFR, Estimated: 48 mL/min — ABNORMAL LOW (ref >60)
Glucose, Bld: 153 mg/dL — ABNORMAL HIGH (ref 70–99)
Potassium: 3.3 mmol/L — ABNORMAL LOW (ref 3.5–5.1)
Sodium: 140 mmol/L (ref 135–145)

## 2023-03-11 LAB — CBC WITH DIFFERENTIAL/PLATELET
Abs Immature Granulocytes: 0.09 10*3/uL — ABNORMAL HIGH (ref 0.00–0.07)
Basophils Absolute: 0.1 10*3/uL (ref 0.0–0.1)
Basophils Relative: 1 %
Eosinophils Absolute: 0.3 10*3/uL (ref 0.0–0.5)
Eosinophils Relative: 3 %
HCT: 37.1 % — ABNORMAL LOW (ref 39.0–52.0)
Hemoglobin: 12.3 g/dL — ABNORMAL LOW (ref 13.0–17.0)
Immature Granulocytes: 1 %
Lymphocytes Relative: 16 %
Lymphs Abs: 2 10*3/uL (ref 0.7–4.0)
MCH: 30.5 pg (ref 26.0–34.0)
MCHC: 33.2 g/dL (ref 30.0–36.0)
MCV: 92.1 fL (ref 80.0–100.0)
Monocytes Absolute: 0.8 10*3/uL (ref 0.1–1.0)
Monocytes Relative: 7 %
Neutro Abs: 9.3 10*3/uL — ABNORMAL HIGH (ref 1.7–7.7)
Neutrophils Relative %: 72 %
Platelets: 290 10*3/uL (ref 150–400)
RBC: 4.03 MIL/uL — ABNORMAL LOW (ref 4.22–5.81)
RDW: 16.6 % — ABNORMAL HIGH (ref 11.5–15.5)
WBC: 12.6 10*3/uL — ABNORMAL HIGH (ref 4.0–10.5)
nRBC: 0 % (ref 0.0–0.2)

## 2023-03-11 MED ORDER — LORAZEPAM 2 MG/ML IJ SOLN
1.0000 mg | INTRAMUSCULAR | Status: AC | PRN
  Administered 2023-03-11: 1 mg via INTRAVENOUS
  Filled 2023-03-11: qty 1

## 2023-03-11 MED ORDER — CYCLOBENZAPRINE HCL 10 MG PO TABS
5.0000 mg | ORAL_TABLET | Freq: Once | ORAL | Status: AC
  Administered 2023-03-11: 5 mg via ORAL
  Filled 2023-03-11: qty 1

## 2023-03-11 MED ORDER — HYDROMORPHONE HCL 1 MG/ML IJ SOLN
1.0000 mg | INTRAMUSCULAR | Status: AC | PRN
  Administered 2023-03-11 – 2023-03-12 (×2): 1 mg via INTRAVENOUS
  Filled 2023-03-11 (×2): qty 1

## 2023-03-11 MED ORDER — HYDROCODONE-ACETAMINOPHEN 5-325 MG PO TABS
2.0000 | ORAL_TABLET | Freq: Once | ORAL | Status: AC
  Administered 2023-03-11: 2 via ORAL
  Filled 2023-03-11: qty 2

## 2023-03-11 MED ORDER — GADOBUTROL 1 MMOL/ML IV SOLN
10.0000 mL | Freq: Once | INTRAVENOUS | Status: AC | PRN
  Administered 2023-03-11: 10 mL via INTRAVENOUS

## 2023-03-11 MED ORDER — LORAZEPAM 0.5 MG PO TABS: 0.5000 mg | ORAL_TABLET | ORAL | Status: DC | PRN

## 2023-03-11 NOTE — ED Triage Notes (Addendum)
Pt reports fall on Sunday, since fall, he states both legs have been numb and he has worsening lower back pain. C/o issues urinating and wife states pt defecated while he was urinating and pt states he could tell he was defecating, but it did not feel like normal. Pt has PT at home, hx of bulging discs.

## 2023-03-11 NOTE — ED Provider Notes (Incomplete)
[facility] AT [facility] Provider Note   CSN: [id number] Arrival date & time: 03/11/23  1052     History  Chief Complaint  Patient presents with   Back Pain    Raymond [name] is a 57 y.o. male.   Back Pain    57 year old male with medical history significant for HTN, DM 2, OSA, COPD, GERD, anxiety, HLD, history of degenerative disc disease in the lower lumbar spine, morbid obesity who presents to the emergency department after a fall on Sunday.  The patient states that he is wheelchair-bound but does frequently at up to stand from his wheelchair as part of his home PT.  He states that he went up to stand and fell backwards landing on his lower bottom.  Since then, he has had urinary and fecal incontinence as well as low back pain radiating down his legs.  He also endorses bilateral lower extremity weakness.  No fevers.  His pain is 8 out of 10 in severity. He denies any head trauma or loss of consciousness, is not on anticoagulation.  He arrives GCS 15, ABC intact.  Home Medications Prior to Admission medications   Medication Sig Start Date End Date Taking? Authorizing Provider  albuterol (PROVENTIL) (2.5 MG/3ML) 0.083% nebulizer solution Inhale 3 mLs into the lungs every 4 (four) hours as needed for wheezing or shortness of breath. 09/06/16   Rolan Bucco, MD  allopurinol (ZYLOPRIM) 300 MG tablet Take 300 mg by mouth daily.    [provider]  amLODipine (NORVASC) 10 MG tablet Take 1 tablet (10 mg total) by mouth daily. 01/13/23   Briant Cedar, MD  atorvastatin (LIPITOR) 40 MG tablet Take 40 mg by mouth daily.    [provider]  bumetanide (BUMEX) 2 MG tablet Take 4 mg by mouth 2 (two) times daily.    [provider]  carvedilol (COREG) 25 MG tablet Take 25 mg by mouth 2 (two) times daily with a meal.    [provider]  cholecalciferol (CHOLECALCIFEROL) 25 MCG tablet Take 1 tablet (1,000 Units total) by mouth  daily. 01/13/23   Briant Cedar, MD  docusate sodium (COLACE) 100 MG capsule Take 1 capsule (100 mg total) by mouth 2 (two) times daily. 01/12/23   Briant Cedar, MD  doxazosin (CARDURA) 2 MG tablet Take 2 mg by mouth at bedtime. 07/25/14   [provider]  DULoxetine (CYMBALTA) 60 MG capsule Take 60 mg by mouth daily.    [provider]  glipiZIDE (GLUCOTROL) 10 MG tablet Take 10 mg by mouth daily before breakfast.    [provider]  losartan (COZAAR) 50 MG tablet Take 50 mg by mouth daily.    [provider]  metFORMIN (GLUCOPHAGE-XR) 500 MG 24 hr tablet Take 1,000 mg by mouth daily with breakfast.  09/27/14   [provider]  nystatin-triamcinolone (MYCOLOG II) cream Apply 1 application topically daily as needed (itch).  08/06/15   [provider]  omeprazole (PRILOSEC) 40 MG capsule Take 40 mg by mouth daily.    [provider]  oxyCODONE (ROXICODONE) 5 MG immediate release tablet Take 1 tablet (5 mg total) by mouth every 6 (six) hours as needed for moderate pain. 01/12/23   Briant Cedar, MD  polyethylene glycol (MIRALAX / GLYCOLAX) 17 g packet Take 17 g by mouth 2 (two) times daily. 01/12/23   Briant Cedar, MD  potassium chloride SA (K-DUR,KLOR-CON) 20 MEQ tablet Take 30  mEq by mouth 2 (two) times daily.    [provider]  pramipexole (MIRAPEX) 0.5 MG tablet Take 0.5 mg by mouth daily.    [provider]  senna-docusate (SENOKOT-S) 8.6-50 MG tablet Take 1 tablet by mouth 2 (two) times daily. 01/12/23   Briant Cedar, MD      Allergies    Sulfa antibiotics    Review of Systems   Review of Systems  Musculoskeletal:  Positive for back pain.  All other systems reviewed and are negative.   Physical Exam Updated Vital Signs BP (!) 190/71 (BP Location: Right Arm)   Pulse 81   Temp 98 F (36.7 C) (Oral)   Resp 18   Ht 5\' 11"  (1.803 m)   Wt (!) 144.2 kg   SpO2 99%   BMI 44.35  kg/m  Physical Exam Vitals and nursing note reviewed.  Constitutional:      Appearance: He is well-developed. He is obese.     Comments: GCS 15, ABC intact  HENT:     Head: Normocephalic.  Eyes:     Conjunctiva/sclera: Conjunctivae normal.  Neck:     Comments: No midline tenderness to palpation of the cervical spine. ROM intact. Cardiovascular:     Rate and Rhythm: Normal rate and regular rhythm.  Pulmonary:     Effort: Pulmonary effort is normal. No respiratory distress.     Breath sounds: Normal breath sounds.  Chest:     Comments: Chest wall stable and non-tender to AP and lateral compression. Clavicles stable and non-tender to AP compression Abdominal:     Palpations: Abdomen is soft.     Tenderness: There is no abdominal tenderness.     Comments: Pelvis stable to lateral compression.  Musculoskeletal:     Cervical back: Neck supple.     Comments: Tenderness to palpation of the midline of the thoracic and lumbar spine extremities atraumatic with intact ROM.   Skin:    General: Skin is warm and dry.  Neurological:     Mental Status: He is alert.     Comments: CN II-XII grossly intact. Moving all four extremities spontaneously and sensation grossly intact however 4-5 strength noted in the bilateral lower extremities.     ED Results / Procedures / Treatments   Labs (all labs ordered are listed, but only abnormal results are displayed) Labs Reviewed - No data to display  EKG None  Radiology No results found.  Procedures Procedures    Medications Ordered in ED Medications - No data to display  ED Course/ Medical Decision Making/ A&P                                 Medical Decision Making Amount and/or Complexity of Data Reviewed Labs: ordered. Radiology: ordered.  Risk Prescription drug management.     57 year old male with medical history significant for HTN, DM 2, OSA, COPD, GERD, anxiety, HLD, history of degenerative disc disease in the lower  lumbar spine, morbid obesity who presents to the emergency department after a fall on Sunday.  The patient states that he is wheelchair-bound but does frequently at up to stand from his wheelchair as part of his home PT.  He states that he went up to stand and fell backwards landing on his lower bottom.  Since then, he has had urinary and fecal incontinence as well as low back pain radiating down his legs.  He also endorses  bilateral lower extremity weakness.  No fevers.  His pain is 8 out of 10 in severity. He denies any head trauma or loss of consciousness, is not on anticoagulation.  He arrives GCS 15, ABC intact.  ***  {Document critical care time when appropriate:1} {Document review of labs and clinical decision tools ie heart score, Chads2Vasc2 etc:1}  {Document your independent review of radiology images, and any outside records:1} {Document your discussion with family members, caretakers, and with consultants:1} {Document social determinants of health affecting pt's care:1} {Document your decision making why or why not admission, treatments were needed:1} Final Clinical Impression(s) / ED Diagnoses Final diagnoses:  None    Rx / DC Orders ED Discharge Orders     None

## 2023-03-11 NOTE — ED Notes (Signed)
Pt called out c/o still being in pain. RN notified.

## 2023-03-12 ENCOUNTER — Other Ambulatory Visit: Payer: Self-pay

## 2023-03-12 ENCOUNTER — Inpatient Hospital Stay (HOSPITAL_COMMUNITY): Payer: Managed Care, Other (non HMO) | Admitting: Anesthesiology

## 2023-03-12 ENCOUNTER — Inpatient Hospital Stay (HOSPITAL_COMMUNITY)
Admission: EM | Disposition: A | Discharge status: Rehabilitation Facility | Payer: Self-pay | Source: Home / Self Care | Attending: Neurosurgery

## 2023-03-12 ENCOUNTER — Inpatient Hospital Stay (HOSPITAL_COMMUNITY): Payer: Managed Care, Other (non HMO)

## 2023-03-12 ENCOUNTER — Encounter (HOSPITAL_COMMUNITY): Payer: Self-pay | Admitting: Neurosurgery

## 2023-03-12 DIAGNOSIS — M4714 Other spondylosis with myelopathy, thoracic region: Secondary | ICD-10-CM | POA: Diagnosis not present

## 2023-03-12 DIAGNOSIS — W19XXXA Unspecified fall, initial encounter: Secondary | ICD-10-CM | POA: Diagnosis present

## 2023-03-12 DIAGNOSIS — R159 Full incontinence of feces: Secondary | ICD-10-CM | POA: Diagnosis present

## 2023-03-12 DIAGNOSIS — Z6841 Body Mass Index (BMI) 40.0 and over, adult: Secondary | ICD-10-CM | POA: Diagnosis not present

## 2023-03-12 DIAGNOSIS — R5381 Other malaise: Secondary | ICD-10-CM | POA: Diagnosis not present

## 2023-03-12 DIAGNOSIS — Z7984 Long term (current) use of oral hypoglycemic drugs: Secondary | ICD-10-CM | POA: Diagnosis not present

## 2023-03-12 DIAGNOSIS — E876 Hypokalemia: Secondary | ICD-10-CM | POA: Diagnosis present

## 2023-03-12 DIAGNOSIS — F54 Psychological and behavioral factors associated with disorders or diseases classified elsewhere: Secondary | ICD-10-CM | POA: Diagnosis not present

## 2023-03-12 DIAGNOSIS — R0789 Other chest pain: Secondary | ICD-10-CM | POA: Diagnosis not present

## 2023-03-12 DIAGNOSIS — Z882 Allergy status to sulfonamides status: Secondary | ICD-10-CM | POA: Diagnosis not present

## 2023-03-12 DIAGNOSIS — M5124 Other intervertebral disc displacement, thoracic region: Secondary | ICD-10-CM | POA: Diagnosis not present

## 2023-03-12 DIAGNOSIS — E119 Type 2 diabetes mellitus without complications: Secondary | ICD-10-CM | POA: Diagnosis present

## 2023-03-12 DIAGNOSIS — M5442 Lumbago with sciatica, left side: Secondary | ICD-10-CM | POA: Diagnosis present

## 2023-03-12 DIAGNOSIS — R531 Weakness: Secondary | ICD-10-CM | POA: Diagnosis present

## 2023-03-12 DIAGNOSIS — N319 Neuromuscular dysfunction of bladder, unspecified: Secondary | ICD-10-CM | POA: Diagnosis not present

## 2023-03-12 DIAGNOSIS — I10 Essential (primary) hypertension: Secondary | ICD-10-CM | POA: Diagnosis not present

## 2023-03-12 DIAGNOSIS — G4730 Sleep apnea, unspecified: Secondary | ICD-10-CM | POA: Diagnosis present

## 2023-03-12 DIAGNOSIS — R252 Cramp and spasm: Secondary | ICD-10-CM | POA: Diagnosis not present

## 2023-03-12 DIAGNOSIS — M109 Gout, unspecified: Secondary | ICD-10-CM | POA: Diagnosis present

## 2023-03-12 DIAGNOSIS — J449 Chronic obstructive pulmonary disease, unspecified: Secondary | ICD-10-CM | POA: Diagnosis present

## 2023-03-12 DIAGNOSIS — R32 Unspecified urinary incontinence: Secondary | ICD-10-CM | POA: Diagnosis present

## 2023-03-12 DIAGNOSIS — M5104 Intervertebral disc disorders with myelopathy, thoracic region: Secondary | ICD-10-CM | POA: Diagnosis present

## 2023-03-12 DIAGNOSIS — K219 Gastro-esophageal reflux disease without esophagitis: Secondary | ICD-10-CM | POA: Diagnosis present

## 2023-03-12 DIAGNOSIS — Z79899 Other long term (current) drug therapy: Secondary | ICD-10-CM | POA: Diagnosis not present

## 2023-03-12 DIAGNOSIS — D72829 Elevated white blood cell count, unspecified: Secondary | ICD-10-CM | POA: Diagnosis present

## 2023-03-12 DIAGNOSIS — E785 Hyperlipidemia, unspecified: Secondary | ICD-10-CM | POA: Diagnosis present

## 2023-03-12 DIAGNOSIS — F419 Anxiety disorder, unspecified: Secondary | ICD-10-CM | POA: Diagnosis present

## 2023-03-12 DIAGNOSIS — Z8249 Family history of ischemic heart disease and other diseases of the circulatory system: Secondary | ICD-10-CM | POA: Diagnosis not present

## 2023-03-12 DIAGNOSIS — K5900 Constipation, unspecified: Secondary | ICD-10-CM | POA: Diagnosis not present

## 2023-03-12 DIAGNOSIS — F17210 Nicotine dependence, cigarettes, uncomplicated: Secondary | ICD-10-CM | POA: Diagnosis present

## 2023-03-12 DIAGNOSIS — M5441 Lumbago with sciatica, right side: Secondary | ICD-10-CM | POA: Diagnosis present

## 2023-03-12 DIAGNOSIS — G4733 Obstructive sleep apnea (adult) (pediatric): Secondary | ICD-10-CM | POA: Diagnosis not present

## 2023-03-12 DIAGNOSIS — N189 Chronic kidney disease, unspecified: Secondary | ICD-10-CM | POA: Diagnosis not present

## 2023-03-12 DIAGNOSIS — Z833 Family history of diabetes mellitus: Secondary | ICD-10-CM | POA: Diagnosis not present

## 2023-03-12 DIAGNOSIS — K592 Neurogenic bowel, not elsewhere classified: Secondary | ICD-10-CM | POA: Diagnosis not present

## 2023-03-12 DIAGNOSIS — N179 Acute kidney failure, unspecified: Secondary | ICD-10-CM | POA: Diagnosis not present

## 2023-03-12 HISTORY — PX: THORACIC DISCECTOMY: SHX6113

## 2023-03-12 LAB — CBC
HCT: 36.8 % — ABNORMAL LOW (ref 39.0–52.0)
Hemoglobin: 11.9 g/dL — ABNORMAL LOW (ref 13.0–17.0)
MCH: 29.9 pg (ref 26.0–34.0)
MCHC: 32.3 g/dL (ref 30.0–36.0)
MCV: 92.5 fL (ref 80.0–100.0)
Platelets: 289 10*3/uL (ref 150–400)
RBC: 3.98 MIL/uL — ABNORMAL LOW (ref 4.22–5.81)
RDW: 16.8 % — ABNORMAL HIGH (ref 11.5–15.5)
WBC: 13.3 10*3/uL — ABNORMAL HIGH (ref 4.0–10.5)
nRBC: 0 % (ref 0.0–0.2)

## 2023-03-12 LAB — HEMOGLOBIN A1C
Hgb A1c MFr Bld: 6 % — ABNORMAL HIGH (ref 4.8–5.6)
Mean Plasma Glucose: 125.5 mg/dL

## 2023-03-12 LAB — CBG MONITORING, ED
Glucose-Capillary: 112 mg/dL — ABNORMAL HIGH (ref 70–99)
Glucose-Capillary: 110 mg/dL — ABNORMAL HIGH (ref 70–99)

## 2023-03-12 LAB — GLUCOSE, CAPILLARY
Glucose-Capillary: 112 mg/dL — ABNORMAL HIGH (ref 70–99)
Glucose-Capillary: 137 mg/dL — ABNORMAL HIGH (ref 70–99)
Glucose-Capillary: 150 mg/dL — ABNORMAL HIGH (ref 70–99)
Glucose-Capillary: 157 mg/dL — ABNORMAL HIGH (ref 70–99)

## 2023-03-12 LAB — SURGICAL PCR SCREEN
MRSA, PCR: NEGATIVE
Staphylococcus aureus: NEGATIVE

## 2023-03-12 LAB — CREATININE, SERUM
Creatinine, Ser: 1.35 mg/dL — ABNORMAL HIGH (ref 0.61–1.24)
GFR, Estimated: >60 mL/min (ref >60)

## 2023-03-12 LAB — APTT: aPTT: 24 s (ref 24–36)

## 2023-03-12 LAB — PROTIME-INR
INR: 1 (ref 0.8–1.2)
Prothrombin Time: 13.2 s (ref 11.4–15.2)

## 2023-03-12 SURGERY — THORACIC DISCECTOMY
Anesthesia: General

## 2023-03-12 MED ORDER — HYDROMORPHONE HCL 1 MG/ML IJ SOLN
0.2500 mg | INTRAMUSCULAR | Status: DC | PRN
  Administered 2023-03-12 (×2): 0.5 mg via INTRAVENOUS

## 2023-03-12 MED ORDER — BUPIVACAINE HCL (PF) 0.5 % IJ SOLN
INTRAMUSCULAR | Status: AC
  Filled 2023-03-12: qty 30

## 2023-03-12 MED ORDER — ONDANSETRON HCL 4 MG/2ML IJ SOLN
INTRAMUSCULAR | Status: AC
  Filled 2023-03-12: qty 2

## 2023-03-12 MED ORDER — THROMBIN (RECOMBINANT) 5000 UNITS EX SOLR
CUTANEOUS | Status: DC | PRN
  Administered 2023-03-12: 1 via TOPICAL

## 2023-03-12 MED ORDER — HYDRALAZINE HCL 20 MG/ML IJ SOLN
INTRAMUSCULAR | Status: AC
  Filled 2023-03-12: qty 1

## 2023-03-12 MED ORDER — PROPOFOL 10 MG/ML IV BOLUS
INTRAVENOUS | Status: AC
  Filled 2023-03-12: qty 20

## 2023-03-12 MED ORDER — PANTOPRAZOLE SODIUM 40 MG PO TBEC
80.0000 mg | DELAYED_RELEASE_TABLET | Freq: Every day | ORAL | Status: DC
  Administered 2023-03-12 – 2023-03-23 (×12): 80 mg via ORAL
  Filled 2023-03-12 (×13): qty 2

## 2023-03-12 MED ORDER — OXYCODONE HCL 5 MG PO TABS
5.0000 mg | ORAL_TABLET | ORAL | Status: DC | PRN
  Administered 2023-03-13 – 2023-03-23 (×28): 5 mg via ORAL
  Filled 2023-03-12 (×31): qty 1

## 2023-03-12 MED ORDER — FENTANYL CITRATE (PF) 250 MCG/5ML IJ SOLN
INTRAMUSCULAR | Status: DC | PRN
  Administered 2023-03-12: 100 ug via INTRAVENOUS
  Administered 2023-03-12 (×3): 50 ug via INTRAVENOUS

## 2023-03-12 MED ORDER — ROCURONIUM BROMIDE 10 MG/ML (PF) SYRINGE
PREFILLED_SYRINGE | INTRAVENOUS | Status: AC
  Filled 2023-03-12: qty 10

## 2023-03-12 MED ORDER — SENNOSIDES-DOCUSATE SODIUM 8.6-50 MG PO TABS
1.0000 | ORAL_TABLET | Freq: Two times a day (BID) | ORAL | Status: DC
  Administered 2023-03-12 – 2023-03-23 (×22): 1 via ORAL
  Filled 2023-03-12 (×23): qty 1

## 2023-03-12 MED ORDER — POTASSIUM CHLORIDE CRYS ER 10 MEQ PO TBCR
30.0000 meq | EXTENDED_RELEASE_TABLET | Freq: Two times a day (BID) | ORAL | Status: DC
  Administered 2023-03-12 – 2023-03-23 (×22): 30 meq via ORAL
  Filled 2023-03-12 (×26): qty 3

## 2023-03-12 MED ORDER — TAMSULOSIN HCL 0.4 MG PO CAPS
0.4000 mg | ORAL_CAPSULE | Freq: Every day | ORAL | Status: DC
  Administered 2023-03-12 – 2023-03-22 (×11): 0.4 mg via ORAL
  Filled 2023-03-12 (×11): qty 1

## 2023-03-12 MED ORDER — SUCCINYLCHOLINE CHLORIDE 200 MG/10ML IV SOSY
PREFILLED_SYRINGE | INTRAVENOUS | Status: DC | PRN
  Administered 2023-03-12: 200 mg via INTRAVENOUS

## 2023-03-12 MED ORDER — LOSARTAN POTASSIUM 50 MG PO TABS
50.0000 mg | ORAL_TABLET | Freq: Every day | ORAL | Status: DC
  Administered 2023-03-12 – 2023-03-23 (×12): 50 mg via ORAL
  Filled 2023-03-12 (×12): qty 1

## 2023-03-12 MED ORDER — HYDRALAZINE HCL 20 MG/ML IJ SOLN
INTRAMUSCULAR | Status: DC | PRN
Start: 2023-03-12 — End: 2023-03-12
  Administered 2023-03-12 (×4): 5 mg via INTRAVENOUS

## 2023-03-12 MED ORDER — DOXAZOSIN MESYLATE 2 MG PO TABS
2.0000 mg | ORAL_TABLET | Freq: Every day | ORAL | Status: DC
  Administered 2023-03-12 – 2023-03-22 (×11): 2 mg via ORAL
  Filled 2023-03-12 (×14): qty 1

## 2023-03-12 MED ORDER — LIDOCAINE 2% (20 MG/ML) 5 ML SYRINGE
INTRAMUSCULAR | Status: AC
  Filled 2023-03-12: qty 5

## 2023-03-12 MED ORDER — DEXAMETHASONE SODIUM PHOSPHATE 10 MG/ML IJ SOLN
INTRAMUSCULAR | Status: AC
  Filled 2023-03-12: qty 1

## 2023-03-12 MED ORDER — FUROSEMIDE 40 MG PO TABS
80.0000 mg | ORAL_TABLET | Freq: Two times a day (BID) | ORAL | Status: DC
  Administered 2023-03-12 – 2023-03-23 (×23): 80 mg via ORAL
  Filled 2023-03-12 (×23): qty 2

## 2023-03-12 MED ORDER — ALBUTEROL SULFATE HFA 108 (90 BASE) MCG/ACT IN AERS
INHALATION_SPRAY | RESPIRATORY_TRACT | Status: DC | PRN
Start: 2023-03-12 — End: 2023-03-12
  Administered 2023-03-12: 2 via RESPIRATORY_TRACT
  Administered 2023-03-12: 6 via RESPIRATORY_TRACT

## 2023-03-12 MED ORDER — LACTATED RINGERS IV SOLN
INTRAVENOUS | Status: DC | PRN
Start: 2023-03-12 — End: 2023-03-12

## 2023-03-12 MED ORDER — MIDAZOLAM HCL 2 MG/2ML IJ SOLN
INTRAMUSCULAR | Status: DC | PRN
  Administered 2023-03-12: 2 mg via INTRAVENOUS

## 2023-03-12 MED ORDER — PROPOFOL 10 MG/ML IV BOLUS
INTRAVENOUS | Status: DC | PRN
  Administered 2023-03-12: 20 mg via INTRAVENOUS
  Administered 2023-03-12: 300 mg via INTRAVENOUS
  Administered 2023-03-12 (×2): 50 mg via INTRAVENOUS

## 2023-03-12 MED ORDER — PRAMIPEXOLE DIHYDROCHLORIDE 0.25 MG PO TABS
0.5000 mg | ORAL_TABLET | Freq: Two times a day (BID) | ORAL | Status: DC
  Administered 2023-03-12 – 2023-03-23 (×22): 0.5 mg via ORAL
  Filled 2023-03-12 (×24): qty 2

## 2023-03-12 MED ORDER — THROMBIN 5000 UNITS EX SOLR
CUTANEOUS | Status: AC
  Filled 2023-03-12: qty 10000

## 2023-03-12 MED ORDER — FENTANYL CITRATE (PF) 250 MCG/5ML IJ SOLN
INTRAMUSCULAR | Status: AC
  Filled 2023-03-12: qty 5

## 2023-03-12 MED ORDER — DEXAMETHASONE SODIUM PHOSPHATE 10 MG/ML IJ SOLN
INTRAMUSCULAR | Status: DC | PRN
  Administered 2023-03-12: 8 mg via INTRAVENOUS

## 2023-03-12 MED ORDER — INSULIN ASPART 100 UNIT/ML IJ SOLN
0.0000 [IU] | Freq: Three times a day (TID) | INTRAMUSCULAR | Status: DC
  Administered 2023-03-12: 3 [IU] via SUBCUTANEOUS
  Administered 2023-03-12: 4 [IU] via SUBCUTANEOUS
  Administered 2023-03-13 – 2023-03-15 (×3): 3 [IU] via SUBCUTANEOUS

## 2023-03-12 MED ORDER — ALPRAZOLAM 0.5 MG PO TABS
0.5000 mg | ORAL_TABLET | Freq: Every evening | ORAL | Status: DC | PRN
  Administered 2023-03-14 – 2023-03-18 (×4): 0.5 mg via ORAL
  Filled 2023-03-12 (×4): qty 1

## 2023-03-12 MED ORDER — CEFAZOLIN SODIUM-DEXTROSE 2-3 GM-%(50ML) IV SOLR
INTRAVENOUS | Status: DC | PRN
Start: 2023-03-12 — End: 2023-03-12
  Administered 2023-03-12: 2 g via INTRAVENOUS

## 2023-03-12 MED ORDER — BUMETANIDE 1 MG PO TABS: 4.0000 mg | ORAL_TABLET | Freq: Two times a day (BID) | ORAL | Status: DC

## 2023-03-12 MED ORDER — ONDANSETRON HCL 4 MG/2ML IJ SOLN
4.0000 mg | Freq: Four times a day (QID) | INTRAMUSCULAR | Status: DC | PRN
  Filled 2023-03-12: qty 2

## 2023-03-12 MED ORDER — DULOXETINE HCL 60 MG PO CPEP
60.0000 mg | ORAL_CAPSULE | Freq: Every day | ORAL | Status: DC
  Administered 2023-03-12 – 2023-03-23 (×12): 60 mg via ORAL
  Filled 2023-03-12 (×12): qty 1

## 2023-03-12 MED ORDER — INSULIN ASPART 100 UNIT/ML IJ SOLN
0.0000 [IU] | INTRAMUSCULAR | Status: DC
  Filled 2023-03-12: qty 0.2

## 2023-03-12 MED ORDER — CARVEDILOL 25 MG PO TABS
25.0000 mg | ORAL_TABLET | Freq: Two times a day (BID) | ORAL | Status: DC
  Administered 2023-03-12 – 2023-03-23 (×24): 25 mg via ORAL
  Filled 2023-03-12 (×21): qty 1
  Filled 2023-03-12: qty 2
  Filled 2023-03-12 (×2): qty 1

## 2023-03-12 MED ORDER — CYCLOBENZAPRINE HCL 10 MG PO TABS
10.0000 mg | ORAL_TABLET | Freq: Three times a day (TID) | ORAL | Status: DC | PRN
  Administered 2023-03-12 – 2023-03-23 (×17): 10 mg via ORAL
  Filled 2023-03-12 (×18): qty 1

## 2023-03-12 MED ORDER — DEXMEDETOMIDINE HCL IN NACL 80 MCG/20ML IV SOLN
INTRAVENOUS | Status: DC | PRN
Start: 2023-03-12 — End: 2023-03-12
  Administered 2023-03-12 (×2): 4 ug via INTRAVENOUS
  Administered 2023-03-12: 8 ug via INTRAVENOUS
  Administered 2023-03-12: 4 ug via INTRAVENOUS

## 2023-03-12 MED ORDER — BUPIVACAINE HCL (PF) 0.5 % IJ SOLN
INTRAMUSCULAR | Status: DC | PRN
Start: 2023-03-12 — End: 2023-03-12
  Administered 2023-03-12: 20 mL

## 2023-03-12 MED ORDER — POTASSIUM CHLORIDE IN NACL 20-0.9 MEQ/L-% IV SOLN
INTRAVENOUS | Status: AC
  Filled 2023-03-12 (×2): qty 1000

## 2023-03-12 MED ORDER — ALBUTEROL SULFATE (2.5 MG/3ML) 0.083% IN NEBU: 3.0000 mL | INHALATION_SOLUTION | RESPIRATORY_TRACT | Status: DC | PRN

## 2023-03-12 MED ORDER — HYDROMORPHONE HCL 1 MG/ML IJ SOLN
INTRAMUSCULAR | Status: AC
  Filled 2023-03-12: qty 1

## 2023-03-12 MED ORDER — OXYCODONE HCL 5 MG PO TABS
5.0000 mg | ORAL_TABLET | Freq: Once | ORAL | Status: AC | PRN
  Administered 2023-03-12: 5 mg via ORAL

## 2023-03-12 MED ORDER — METFORMIN HCL ER 500 MG PO TB24
1000.0000 mg | ORAL_TABLET | Freq: Every day | ORAL | Status: DC
  Administered 2023-03-13 – 2023-03-23 (×11): 1000 mg via ORAL
  Filled 2023-03-12 (×13): qty 2

## 2023-03-12 MED ORDER — ONDANSETRON HCL 4 MG/2ML IJ SOLN: 4.0000 mg | Freq: Once | INTRAMUSCULAR | Status: DC | PRN

## 2023-03-12 MED ORDER — LIDOCAINE-EPINEPHRINE 0.5 %-1:200000 IJ SOLN
INTRAMUSCULAR | Status: DC | PRN
  Administered 2023-03-12: 10 mL

## 2023-03-12 MED ORDER — ORAL CARE MOUTH RINSE: 15.0000 mL | Freq: Once | OROMUCOSAL | Status: AC

## 2023-03-12 MED ORDER — INSULIN ASPART 100 UNIT/ML IJ SOLN: 0.0000 [IU] | Freq: Three times a day (TID) | INTRAMUSCULAR | Status: DC

## 2023-03-12 MED ORDER — ACETAMINOPHEN 650 MG RE SUPP: 650.0000 mg | Freq: Four times a day (QID) | RECTAL | Status: DC | PRN

## 2023-03-12 MED ORDER — ACETAMINOPHEN 500 MG PO TABS
1000.0000 mg | ORAL_TABLET | Freq: Once | ORAL | Status: AC
  Administered 2023-03-12: 1000 mg via ORAL
  Filled 2023-03-12: qty 2

## 2023-03-12 MED ORDER — GLIPIZIDE 10 MG PO TABS
10.0000 mg | ORAL_TABLET | Freq: Every day | ORAL | Status: DC
  Administered 2023-03-13 – 2023-03-23 (×11): 10 mg via ORAL
  Filled 2023-03-12 (×13): qty 1

## 2023-03-12 MED ORDER — ALBUTEROL SULFATE HFA 108 (90 BASE) MCG/ACT IN AERS
INHALATION_SPRAY | RESPIRATORY_TRACT | Status: AC
  Filled 2023-03-12: qty 6.7

## 2023-03-12 MED ORDER — METOPROLOL TARTRATE 5 MG/5ML IV SOLN
INTRAVENOUS | Status: AC
  Filled 2023-03-12: qty 5

## 2023-03-12 MED ORDER — ALBUTEROL SULFATE (2.5 MG/3ML) 0.083% IN NEBU: 2.5000 mg | INHALATION_SOLUTION | RESPIRATORY_TRACT | Status: DC | PRN

## 2023-03-12 MED ORDER — FENTANYL CITRATE (PF) 100 MCG/2ML IJ SOLN
INTRAMUSCULAR | Status: AC
  Filled 2023-03-12: qty 2

## 2023-03-12 MED ORDER — ATORVASTATIN CALCIUM 40 MG PO TABS
40.0000 mg | ORAL_TABLET | Freq: Every day | ORAL | Status: DC
  Administered 2023-03-12 – 2023-03-23 (×12): 40 mg via ORAL
  Filled 2023-03-12 (×12): qty 1

## 2023-03-12 MED ORDER — HEPARIN SODIUM (PORCINE) 5000 UNIT/ML IJ SOLN
5000.0000 [IU] | Freq: Three times a day (TID) | INTRAMUSCULAR | Status: DC
  Administered 2023-03-12 – 2023-03-23 (×34): 5000 [IU] via SUBCUTANEOUS
  Filled 2023-03-12 (×34): qty 1

## 2023-03-12 MED ORDER — DEXMEDETOMIDINE HCL IN NACL 80 MCG/20ML IV SOLN
INTRAVENOUS | Status: AC
  Filled 2023-03-12: qty 20

## 2023-03-12 MED ORDER — ONDANSETRON HCL 4 MG/2ML IJ SOLN
INTRAMUSCULAR | Status: DC | PRN
  Administered 2023-03-12: 4 mg via INTRAVENOUS

## 2023-03-12 MED ORDER — OXYCODONE HCL 5 MG/5ML PO SOLN: 5.0000 mg | Freq: Once | ORAL | Status: AC | PRN

## 2023-03-12 MED ORDER — KETAMINE HCL 50 MG/5ML IJ SOSY
PREFILLED_SYRINGE | INTRAMUSCULAR | Status: AC
  Filled 2023-03-12: qty 5

## 2023-03-12 MED ORDER — HYDROMORPHONE HCL 1 MG/ML IJ SOLN
0.5000 mg | INTRAMUSCULAR | Status: DC | PRN
  Administered 2023-03-12 – 2023-03-23 (×34): 1 mg via INTRAVENOUS
  Filled 2023-03-12 (×36): qty 1

## 2023-03-12 MED ORDER — MAGNESIUM CITRATE PO SOLN: 1.0000 | Freq: Once | ORAL | Status: DC | PRN

## 2023-03-12 MED ORDER — MIDAZOLAM HCL 2 MG/2ML IJ SOLN
INTRAMUSCULAR | Status: AC
  Filled 2023-03-12: qty 2

## 2023-03-12 MED ORDER — CHLORHEXIDINE GLUCONATE 0.12 % MT SOLN
15.0000 mL | Freq: Once | OROMUCOSAL | Status: AC
  Administered 2023-03-12: 15 mL via OROMUCOSAL
  Filled 2023-03-12: qty 15

## 2023-03-12 MED ORDER — ROCURONIUM BROMIDE 10 MG/ML (PF) SYRINGE
PREFILLED_SYRINGE | INTRAVENOUS | Status: DC | PRN
  Administered 2023-03-12: 50 mg via INTRAVENOUS
  Administered 2023-03-12: 20 mg via INTRAVENOUS
  Administered 2023-03-12 (×2): 10 mg via INTRAVENOUS

## 2023-03-12 MED ORDER — AMLODIPINE BESYLATE 10 MG PO TABS
10.0000 mg | ORAL_TABLET | Freq: Every day | ORAL | Status: DC
  Administered 2023-03-12 – 2023-03-23 (×12): 10 mg via ORAL
  Filled 2023-03-12: qty 1
  Filled 2023-03-12: qty 2
  Filled 2023-03-12 (×11): qty 1

## 2023-03-12 MED ORDER — METOPROLOL TARTRATE 5 MG/5ML IV SOLN
INTRAVENOUS | Status: DC | PRN
Start: 2023-03-12 — End: 2023-03-12
  Administered 2023-03-12: 1 mg via INTRAVENOUS
  Administered 2023-03-12: 2 mg via INTRAVENOUS

## 2023-03-12 MED ORDER — LABETALOL HCL 5 MG/ML IV SOLN
INTRAVENOUS | Status: DC | PRN
Start: 2023-03-12 — End: 2023-03-12
  Administered 2023-03-12 (×3): 5 mg via INTRAVENOUS

## 2023-03-12 MED ORDER — LIDOCAINE-EPINEPHRINE 0.5 %-1:200000 IJ SOLN
INTRAMUSCULAR | Status: AC
  Filled 2023-03-12: qty 50

## 2023-03-12 MED ORDER — ACETAMINOPHEN 325 MG PO TABS
650.0000 mg | ORAL_TABLET | Freq: Four times a day (QID) | ORAL | Status: DC | PRN
  Administered 2023-03-13 – 2023-03-19 (×5): 650 mg via ORAL
  Filled 2023-03-12 (×5): qty 2

## 2023-03-12 MED ORDER — LACTATED RINGERS IV SOLN: INTRAVENOUS | Status: DC

## 2023-03-12 MED ORDER — 0.9 % SODIUM CHLORIDE (POUR BTL) OPTIME
TOPICAL | Status: DC | PRN
  Administered 2023-03-12: 1000 mL

## 2023-03-12 MED ORDER — AMISULPRIDE (ANTIEMETIC) 5 MG/2ML IV SOLN: 10.0000 mg | Freq: Once | INTRAVENOUS | Status: DC | PRN

## 2023-03-12 MED ORDER — OXYCODONE HCL 5 MG PO TABS
ORAL_TABLET | ORAL | Status: AC
  Filled 2023-03-12: qty 1

## 2023-03-12 MED ORDER — ALLOPURINOL 300 MG PO TABS
300.0000 mg | ORAL_TABLET | Freq: Every day | ORAL | Status: DC
  Administered 2023-03-12 – 2023-03-23 (×12): 300 mg via ORAL
  Filled 2023-03-12 (×13): qty 1

## 2023-03-12 MED ORDER — ONDANSETRON HCL 4 MG PO TABS: 4.0000 mg | ORAL_TABLET | Freq: Four times a day (QID) | ORAL | Status: DC | PRN

## 2023-03-12 MED ORDER — KETAMINE HCL 10 MG/ML IJ SOLN
INTRAMUSCULAR | Status: DC | PRN
Start: 2023-03-12 — End: 2023-03-12
  Administered 2023-03-12 (×2): 10 mg via INTRAVENOUS
  Administered 2023-03-12: 20 mg via INTRAVENOUS
  Administered 2023-03-12: 10 mg via INTRAVENOUS

## 2023-03-12 MED ORDER — SUGAMMADEX SODIUM 200 MG/2ML IV SOLN
INTRAVENOUS | Status: DC | PRN
  Administered 2023-03-12: 300 mg via INTRAVENOUS

## 2023-03-12 MED ORDER — LIDOCAINE 2% (20 MG/ML) 5 ML SYRINGE
INTRAMUSCULAR | Status: DC | PRN
  Administered 2023-03-12: 100 mg via INTRAVENOUS

## 2023-03-12 MED ORDER — INSULIN ASPART 100 UNIT/ML IJ SOLN: 0.0000 [IU] | INTRAMUSCULAR | Status: DC | PRN

## 2023-03-12 MED ORDER — LABETALOL HCL 5 MG/ML IV SOLN
INTRAVENOUS | Status: AC
  Filled 2023-03-12: qty 4

## 2023-03-12 SURGICAL SUPPLY — 47 items
ADH SKN CLS APL DERMABOND .7 (GAUZE/BANDAGES/DRESSINGS) ×1
ADH SKN CLS LQ APL DERMABOND (GAUZE/BANDAGES/DRESSINGS) ×1
APL SKNCLS STERI-STRIP NONHPOA (GAUZE/BANDAGES/DRESSINGS) ×1
BAG COUNTER SPONGE SURGICOUNT (BAG) ×1 IMPLANT
BAG SPNG CNTER NS LX DISP (BAG) ×1
BENZOIN TINCTURE PRP APPL 2/3 (GAUZE/BANDAGES/DRESSINGS) ×1 IMPLANT
BLADE CLIPPER SURG (BLADE) IMPLANT
BUR MATCHSTICK NEURO 3.0 LAGG (BURR) ×1 IMPLANT
BUR PRECISION FLUTE 5.0 (BURR) ×1 IMPLANT
CANISTER SUCT 3000ML PPV (MISCELLANEOUS) ×1 IMPLANT
DERMABOND ADVANCED .7 DNX12 (GAUZE/BANDAGES/DRESSINGS) ×1 IMPLANT
DERMABOND ADVANCED .7 DNX6 (GAUZE/BANDAGES/DRESSINGS) IMPLANT
DRAPE HALF SHEET 40X57 (DRAPES) IMPLANT
DRAPE LAPAROTOMY 100X72X124 (DRAPES) ×1 IMPLANT
DRAPE MICROSCOPE SLANT 54X150 (MISCELLANEOUS) ×1 IMPLANT
DRAPE SURG 17X23 STRL (DRAPES) ×4 IMPLANT
DRSG OPSITE POSTOP 4X6 (GAUZE/BANDAGES/DRESSINGS) IMPLANT
ELECT BLADE 4.0 EZ CLEAN MEGAD (MISCELLANEOUS) ×1
ELECT REM PT RETURN 9FT ADLT (ELECTROSURGICAL) ×1
ELECTRODE BLDE 4.0 EZ CLN MEGD (MISCELLANEOUS) ×1 IMPLANT
ELECTRODE REM PT RTRN 9FT ADLT (ELECTROSURGICAL) ×1 IMPLANT
GAUZE 4X4 16PLY ~~LOC~~+RFID DBL (SPONGE) IMPLANT
GLOVE ECLIPSE 6.5 STRL STRAW (GLOVE) ×1 IMPLANT
GLOVE EXAM NITRILE XL STR (GLOVE) IMPLANT
GOWN STRL REUS W/ TWL LRG LVL3 (GOWN DISPOSABLE) IMPLANT
GOWN STRL REUS W/ TWL XL LVL3 (GOWN DISPOSABLE) ×1 IMPLANT
GOWN STRL REUS W/TWL 2XL LVL3 (GOWN DISPOSABLE) IMPLANT
GOWN STRL REUS W/TWL LRG LVL3 (GOWN DISPOSABLE) ×2
GOWN STRL REUS W/TWL XL LVL3 (GOWN DISPOSABLE)
KIT BASIN OR (CUSTOM PROCEDURE TRAY) ×1 IMPLANT
KIT TURNOVER KIT B (KITS) ×1 IMPLANT
NDL HYPO 21X1.5 SAFETY (NEEDLE) IMPLANT
NDL HYPO 22X1.5 SAFETY MO (MISCELLANEOUS) ×1 IMPLANT
NEEDLE HYPO 21X1.5 SAFETY (NEEDLE) IMPLANT
NEEDLE HYPO 22X1.5 SAFETY MO (MISCELLANEOUS) ×1 IMPLANT
NS IRRIG 1000ML POUR BTL (IV SOLUTION) ×1 IMPLANT
PACK LAMINECTOMY NEURO (CUSTOM PROCEDURE TRAY) ×1 IMPLANT
PAD ARMBOARD 7.5X6 YLW CONV (MISCELLANEOUS) ×3 IMPLANT
PATTIES SURGICAL .5 X1 (DISPOSABLE) IMPLANT
SPONGE SURGIFOAM ABS GEL SZ50 (HEMOSTASIS) ×1 IMPLANT
SUT VIC AB 1 CT1 18XBRD ANBCTR (SUTURE) ×2 IMPLANT
SUT VIC AB 1 CT1 8-18 (SUTURE) ×1
SUT VIC AB 3-0 SH 8-18 (SUTURE) ×2 IMPLANT
TOWEL GREEN STERILE (TOWEL DISPOSABLE) ×1 IMPLANT
TOWEL GREEN STERILE FF (TOWEL DISPOSABLE) ×1 IMPLANT
WATER STERILE IRR 1000ML POUR (IV SOLUTION) ×1 IMPLANT
WIPE CHG 2% 2PK PREOPERATIVE (MISCELLANEOUS) IMPLANT

## 2023-03-12 NOTE — Progress Notes (Signed)
Pt spouse requesting Lasix dose to be increased to home dose - spouse stated pt takes 2 - 80 mg tablets (160mg )  BID and xanax 0.5 PRN.  PTA medications reviewed.  Pharmacy contacted and requested for [facility] MD to be contacted.  [facility] PA contacted - new orders placed.  Will follow up with Dr. Franky Macho tomorrow regarding Lasix dose.

## 2023-03-12 NOTE — Op Note (Signed)
03/12/2023  8:35 PM  PATIENT:  Raymond [name]  57 y.o. 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 He was positioned prone on a jackson table with all pressure points properly padded. His thoracic region was prepped and draped in a sterile manner. I opened the skin with a 10 blade and exposed with sharp dissection the T10, and 11 lamina.  I performed a near complete Thoracic ten laminectomy to provide adequate decompression of the spinal canal and allow for lateral exposure of the disc space. The disc herniation was on the left side.  I used the drill to expose the T10 left nerve root, and the disc. Dr. Jake Samples and I opened the disc space and removed the disc herniation with microscopic dissection. We used microinstruments to remove the disc. By going lateral we were able to not disturb or retract the thecal sac and spinal cord. Once good decompression was obtained we irrigated then closed the wound in layers. I approximated the thoracolumbar fascia, subcutaneous  and subcuticular planes with sutures. He was rolled supine on the stretcher and extubated.   PLAN OF CARE: Admit to inpatient   PATIENT DISPOSITION:  PACU - hemodynamically stable.   Delay start of Pharmacological VTE agent (>24hrs) due to surgical  blood loss or risk of bleeding:  yes

## 2023-03-12 NOTE — H&P (Signed)
Raymond [name] is an 57 y.o. male.   Chief Complaint: urinary incontinence, lower extremity weakness, profound gait difficulties, poor coordination lower extremities HPI: with a long history of back pain. Larey Seat on past Sunday and since has had increased pain in his lower back, problems walking, controlling his urination and bowel movements. 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 Called for evaluation.   Past Medical History:  Diagnosis Date   Anxiety    COPD (chronic obstructive pulmonary disease) (HCC)    Diabetes mellitus without complication (HCC)    Edema    GERD (gastroesophageal reflux disease)    Gout    Heavy smoker    Hyperlipidemia    Hypertension    Sleep apnea    uses a cpap    Past Surgical History:  Procedure Laterality Date   CHONDROPLASTY Left 06/28/2014   Procedure: CHONDROPLASTY;  Surgeon: Thera Flake., MD;  Location: [facility];  Service: Orthopedics;  Laterality: Left;   COLONOSCOPY     FOOT FASCIOTOMY     age 48-rt   KNEE ARTHROSCOPY WITH LATERAL MENISECTOMY Left 06/28/2014   Procedure: KNEE ARTHROSCOPY WITH LATERAL MENISECTOMY;  Surgeon: Thera Flake., MD;  Location: [facility];  Service: Orthopedics;  Laterality: Left;   KNEE ARTHROSCOPY WITH MEDIAL MENISECTOMY Left 06/28/2014   Procedure: LEFT KNEE ARTHROSCOPY CHONDROPLASTY/WITH MEDIAL/LATERAL MENISECTOMIES;  Surgeon: Thera Flake., MD;  Location: [facility];  Service: Orthopedics;  Laterality: Left;   ORIF RADIUS & ULNA FRACTURES  2007   left    Family History  Problem Relation Age of Onset   Diabetes Mother    Hypertension Mother    Diabetes Father    Hypertension Father    Cancer Other    Social History:  reports that he has been smoking cigarettes. He has never used smokeless tobacco. He reports current alcohol use. He reports that he does not use drugs.  Allergies:  Allergies  Allergen Reactions   Sulfa Antibiotics Hives    (Not in a hospital  admission)   Results for orders placed or performed during the hospital encounter of 03/11/23 (from the past 48 hour(s))  CBC with Differential     Status: Abnormal   Collection Time: 03/11/23  4:20 PM  Result Value Ref Range   WBC 12.6 (H) 4.0 - 10.5 K/uL   RBC 4.03 (L) 4.22 - 5.81 MIL/uL   Hemoglobin 12.3 (L) 13.0 - 17.0 g/dL   HCT 30.8 (L) 65.7 - 84.6 %   MCV 92.1 80.0 - 100.0 fL   MCH 30.5 26.0 - 34.0 pg   MCHC 33.2 30.0 - 36.0 g/dL   RDW 96.2 (H) 95.2 - 84.1 %   Platelets 290 150 - 400 K/uL   nRBC 0.0 0.0 - 0.2 %   Neutrophils Relative % 72 %   Neutro Abs 9.3 (H) 1.7 - 7.7 K/uL   Lymphocytes Relative 16 %   Lymphs Abs 2.0 0.7 - 4.0 K/uL   Monocytes Relative 7 %   Monocytes Absolute 0.8 0.1 - 1.0 K/uL   Eosinophils Relative 3 %   Eosinophils Absolute 0.3 0.0 - 0.5 K/uL   Basophils Relative 1 %   Basophils Absolute 0.1 0.0 - 0.1 K/uL   Immature Granulocytes 1 %   Abs Immature Granulocytes 0.09 (H) 0.00 - 0.07 K/uL    Comment: Performed at [facility], [address], [place], [place] [address]  Basic metabolic panel  Status: Abnormal   Collection Time: 03/11/23  4:20 PM  Result Value Ref Range   Sodium 140 135 - 145 mmol/L   Potassium 3.3 (L) 3.5 - 5.1 mmol/L   Chloride 107 98 - 111 mmol/L   CO2 27 22 - 32 mmol/L   Glucose, Bld 153 (H) 70 - 99 mg/dL    Comment: Glucose reference range applies only to samples taken after fasting for at least 8 hours.   BUN 40 (H) 6 - 20 mg/dL   Creatinine, Ser 1.61 (H) 0.61 - 1.24 mg/dL   Calcium 9.1 8.9 - 09.6 mg/dL   GFR, Estimated 48 (L) >60 mL/min    Comment: (NOTE) Calculated using the CKD-EPI Creatinine Equation (2021)    Anion gap 6 5 - 15    Comment: Performed at [facility], [address] [place], [place] [address]   MR Lumbar Spine W Wo Contrast  Result Date: 03/12/2023 CLINICAL DATA:  Initial evaluation for acute myelopathy, incontinence, recent fall. EXAM: MRI THORACIC AND  LUMBAR SPINE WITHOUT AND WITH CONTRAST TECHNIQUE: Multiplanar and multiecho pulse sequences of the thoracic and lumbar spine were obtained without and with intravenous contrast. CONTRAST:  10mL GADAVIST GADOBUTROL 1 MMOL/ML IV SOLN COMPARISON:  Prior CT from 12/25/2022 as well as prior MRI. FINDINGS: MRI THORACIC SPINE FINDINGS Alignment: Vertebral bodies normally aligned with preservation of the normal thoracic kyphosis. Trace 2 mm anterolisthesis of T10 on T11. Vertebrae: Vertebral body height maintained without acute or chronic fracture. Bone marrow signal intensity within normal limits. No worrisome osseous lesions. Degenerative reactive endplate changes present about the T8-9 and T10-11 interspaces. No other abnormal marrow edema. 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 Otherwise normal signal and morphology. Paraspinal and other soft tissues: Unremarkable. Disc levels: T8-9: Advanced degenerative intervertebral disc space narrowing with disc desiccation and diffuse disc bulge. Mild facet hypertrophy. No significant spinal stenosis. Moderate to severe bilateral foraminal narrowing. T9-10: Disc desiccation with minimal disc bulge. Bilateral facet hypertrophy. No significant spinal stenosis. Moderate bilateral foraminal narrowing. T10-11: Trace anterolisthesis. Diffuse disc bulge with disc desiccation. Associated reactive endplate changes. Moderate sized lobulated left subarticular to foraminal disc protrusion (series 20, image 33). Superimposed bilateral facet arthrosis. Resultant severe spinal stenosis. Cord is compressed and deviated to the right. Associated cord signal changes concerning for edema and/or myelomalacia. T11-12: Degenerate intervertebral disc space narrowing with diffuse disc bulge. Mild reactive endplate change. Mild facet hypertrophy. No significant spinal stenosis. Mild bilateral  foraminal narrowing. Otherwise, minor for age spondylosis elsewhere within the thoracic spine. No other significant stenosis or impingement. From 12/31/2022 MRI LUMBAR SPINE FINDINGS Segmentation:  Standard. Alignment: 3 mm retrolisthesis of L3 on L4. Alignment otherwise normal preservation of the normal lumbar lordosis. Vertebrae: Vertebral body height maintained without acute or interval fracture. Bone marrow signal intensity within normal limits. Few small benign hemangiomata noted. No worrisome osseous lesions. Endplate Schmorl's node deformities with mild reactive endplate change noted about the L4-5 interspace. Mild reactive marrow edema about the L4-5 facets due to facet arthritis. Conus medullaris: Extends to the T12 level and appears normal. Minimal soft tissue edema about the L4-5 facets, reactive in nature due to facet arthritis. 7 mm simple left renal cyst, benign in appearance, no follow-up imaging recommended. Paraspinal and other soft tissues: Mild soft tissue edema adjacent to the L4-5 facets, likely reactive due to facet arthritis. 6 mm simple left renal cyst,  benign in appearance, no follow-up imaging recommended. Disc levels: L1-2: Normal interspace. Mild right greater left facet hypertrophy. No spinal stenosis. Mild left L1 foraminal narrowing. Right neural foramen remains patent. L2-3: Mild disc bulge with bilateral facet spurring. No spinal stenosis. No significant foraminal narrowing. L3-4: Diffuse disc bulge with disc desiccation. Mild bilateral facet hypertrophy. No significant spinal stenosis. Mild bilateral L3 foraminal narrowing. L4-5: Diffuse disc bulge with disc desiccation. Superimposed central to left subarticular disc protrusion (series 4, images 30, 31). Moderately advanced facet arthrosis with associated small joint effusions. Probable small associated synovial cysts noted at the posterior aspects of the L4-5 facets bilaterally. Resultant mild canal, with severe left and moderate  right lateral recess stenosis. Severe bilateral L4 foraminal narrowing. L5-S1: Minimal disc bulge. Moderate facet hypertrophy. Mild bilateral L5 foraminal stenosis. No spinal stenosis. IMPRESSION: MRI THORACIC SPINE IMPRESSION: 1. Moderate-sized lobulated left subarticular to foraminal disc protrusion at T10-11 with resultant severe spinal stenosis. Cord is compressed and deviated to the right at this level. Associated cord signal changes concerning for edema and/or myelomalacia. 2. No other acute abnormality within the thoracic spine. 3. Degenerative spondylosis at T8-9 through T11-12 without significant spinal stenosis. Associated moderate to severe bilateral foraminal narrowing at these levels as above. MRI LUMBAR SPINE IMPRESSION: 1. No acute abnormality within the lumbar spine. 2. Multifactorial degenerative changes at L4-5 with resultant mild canal with severe left and moderate right lateral recess stenosis, with severe bilateral L4 foraminal narrowing. 3. Additional mild noncompressive disc bulging and facet hypertrophy elsewhere within the lumbar spine as above. No other significant stenosis or frank neural impingement. Critical Value/emergent results were called by telephone at the time of interpretation on 03/12/2023 at 12:41 am to provider Dr. Madilyn Hook, who verbally acknowledged these results. Electronically Signed   By: Rise Mu M.D.   On: 03/12/2023 00:46   MR THORACIC SPINE W WO CONTRAST  Result Date: 03/12/2023 CLINICAL DATA:  Initial evaluation for acute myelopathy, incontinence, recent fall. EXAM: MRI THORACIC AND LUMBAR SPINE WITHOUT AND WITH CONTRAST TECHNIQUE: Multiplanar and multiecho pulse sequences of the thoracic and lumbar spine were obtained without and with intravenous contrast. CONTRAST:  10mL GADAVIST GADOBUTROL 1 MMOL/ML IV SOLN COMPARISON:  Prior CT from 12/25/2022 as well as prior MRI. FINDINGS: MRI THORACIC SPINE FINDINGS Alignment: Vertebral bodies normally aligned with  preservation of the normal thoracic kyphosis. Trace 2 mm anterolisthesis of T10 on T11. Vertebrae: Vertebral body height maintained without acute or chronic fracture. Bone marrow signal intensity within normal limits. No worrisome osseous lesions. Degenerative reactive endplate changes present about the T8-9 and T10-11 interspaces. No other abnormal marrow edema. 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 Otherwise normal signal and morphology. Paraspinal and other soft tissues: Unremarkable. Disc levels: T8-9: Advanced degenerative intervertebral disc space narrowing with disc desiccation and diffuse disc bulge. Mild facet hypertrophy. No significant spinal stenosis. Moderate to severe bilateral foraminal narrowing. T9-10: Disc desiccation with minimal disc bulge. Bilateral facet hypertrophy. No significant spinal stenosis. Moderate bilateral foraminal narrowing. T10-11: Trace anterolisthesis. Diffuse disc bulge with disc desiccation. Associated reactive endplate changes. Moderate sized lobulated left subarticular to foraminal disc protrusion (series 20, image 33). Superimposed bilateral facet arthrosis. Resultant severe spinal stenosis. Cord is compressed and deviated to the right. Associated cord signal changes concerning for edema and/or myelomalacia. T11-12: Degenerate intervertebral disc space narrowing with diffuse disc bulge. Mild reactive endplate change. Mild facet hypertrophy. No  significant spinal stenosis. Mild bilateral foraminal narrowing. Otherwise, minor for age spondylosis elsewhere within the thoracic spine. No other significant stenosis or impingement. From 12/31/2022 MRI LUMBAR SPINE FINDINGS Segmentation:  Standard. Alignment: 3 mm retrolisthesis of L3 on L4. Alignment otherwise normal preservation of the normal lumbar lordosis. Vertebrae: Vertebral body height maintained without acute or  interval fracture. Bone marrow signal intensity within normal limits. Few small benign hemangiomata noted. No worrisome osseous lesions. Endplate Schmorl's node deformities with mild reactive endplate change noted about the L4-5 interspace. Mild reactive marrow edema about the L4-5 facets due to facet arthritis. Conus medullaris: Extends to the T12 level and appears normal. Minimal soft tissue edema about the L4-5 facets, reactive in nature due to facet arthritis. 7 mm simple left renal cyst, benign in appearance, no follow-up imaging recommended. Paraspinal and other soft tissues: Mild soft tissue edema adjacent to the L4-5 facets, likely reactive due to facet arthritis. 6 mm simple left renal cyst, benign in appearance, no follow-up imaging recommended. Disc levels: L1-2: Normal interspace. Mild right greater left facet hypertrophy. No spinal stenosis. Mild left L1 foraminal narrowing. Right neural foramen remains patent. L2-3: Mild disc bulge with bilateral facet spurring. No spinal stenosis. No significant foraminal narrowing. L3-4: Diffuse disc bulge with disc desiccation. Mild bilateral facet hypertrophy. No significant spinal stenosis. Mild bilateral L3 foraminal narrowing. L4-5: Diffuse disc bulge with disc desiccation. Superimposed central to left subarticular disc protrusion (series 4, images 30, 31). Moderately advanced facet arthrosis with associated small joint effusions. Probable small associated synovial cysts noted at the posterior aspects of the L4-5 facets bilaterally. Resultant mild canal, with severe left and moderate right lateral recess stenosis. Severe bilateral L4 foraminal narrowing. L5-S1: Minimal disc bulge. Moderate facet hypertrophy. Mild bilateral L5 foraminal stenosis. No spinal stenosis. IMPRESSION: MRI THORACIC SPINE IMPRESSION: 1. Moderate-sized lobulated left subarticular to foraminal disc protrusion at T10-11 with resultant severe spinal stenosis. Cord is compressed and deviated  to the right at this level. Associated cord signal changes concerning for edema and/or myelomalacia. 2. No other acute abnormality within the thoracic spine. 3. Degenerative spondylosis at T8-9 through T11-12 without significant spinal stenosis. Associated moderate to severe bilateral foraminal narrowing at these levels as above. MRI LUMBAR SPINE IMPRESSION: 1. No acute abnormality within the lumbar spine. 2. Multifactorial degenerative changes at L4-5 with resultant mild canal with severe left and moderate right lateral recess stenosis, with severe bilateral L4 foraminal narrowing. 3. Additional mild noncompressive disc bulging and facet hypertrophy elsewhere within the lumbar spine as above. No other significant stenosis or frank neural impingement. Critical Value/emergent results were called by telephone at the time of interpretation on 03/12/2023 at 12:41 am to provider Dr. Madilyn Hook, who verbally acknowledged these results. Electronically Signed   By: Rise Mu M.D.   On: 03/12/2023 00:46    Review of Systems  Constitutional:  Positive for fatigue.  HENT: Negative.    Eyes: Negative.   Respiratory:  Positive for shortness of breath and wheezing.   Cardiovascular:  Positive for leg swelling.  Gastrointestinal: Negative.        States he is incontinent of stool  Endocrine: Negative.   Genitourinary:  Positive for difficulty urinating.  Musculoskeletal:  Positive for back pain, gait problem, joint swelling and myalgias.  Skin: Negative.   Neurological:  Positive for weakness.  Hematological: Negative.   Psychiatric/Behavioral: Negative.      Blood pressure (!) 156/76, pulse 83, temperature 98.2 F (36.8 C), temperature source Oral, resp. rate 16,  height 5\' 11"  (1.803 m), weight (!) 144.2 kg, SpO2 92%. Physical Exam Constitutional:      General: He is not in acute distress.    Appearance: He is obese.  HENT:     Head: Normocephalic and atraumatic.     Right Ear: External ear normal.      Left Ear: External ear normal.     Nose: Nose normal.     Mouth/Throat:     Mouth: Mucous membranes are moist.     Pharynx: Oropharynx is clear.  Eyes:     Extraocular Movements: Extraocular movements intact.     Conjunctiva/sclera: Conjunctivae normal.     Pupils: Pupils are equal, round, and reactive to light.  Cardiovascular:     Rate and Rhythm: Normal rate and regular rhythm.  Pulmonary:     Effort: Pulmonary effort is normal.  Abdominal:     General: There is no distension.     Palpations: Abdomen is soft.  Musculoskeletal:        General: Normal range of motion.     Cervical back: Normal range of motion.     Right lower leg: Edema present.     Left lower leg: Edema present.  Skin:    General: Skin is warm and dry.  Neurological:     Mental Status: He is alert and oriented to person, place, and time.     Cranial Nerves: Cranial nerves 2-12 are intact.     Sensory: Sensory deficit present.     Motor: Weakness present.     Coordination: Coordination abnormal. Heel to Shin Test abnormal.     Deep Tendon Reflexes: Babinski sign present on the right side. Babinski sign present on the left side.     Reflex Scores:      Patellar reflexes are 4+ on the right side and 4+ on the left side.      Achilles reflexes are 4+ on the right side and 4+ on the left side.    Comments: 4/5 left hip flexors, quads, hams, dorsiflexion, plantar flexion 5-/5 right hip flexors, quads, hams, dorsiflexors, plantar flexors Decreased proprioception at feet, left worse than right Gait not assessed Sustained clonus both feet       Assessment/Plan Kizer Nobbe Cooks is a 57 y.o. male With worsened pain, motor, and sensory deficits since falling Sunday. He is incontinent of urine with overflow dribbling, and very poor control of bowel movements since the fall. MRI thoracic spine shows hnp at T10/11 with signal changes in the cord. Will admit and take to the OR for laminectomy and discetomy at T10/11.  Risks and benefits bleeding, infection, paralysis, bowel and bladder permanent dysfunction, increased weakness, no neurological improvement have been explained along with other risks. He agrees and wishes to proceed.   Coletta Memos, MD 03/12/2023, 3:01 AM

## 2023-03-12 NOTE — Plan of Care (Signed)
  Problem: Education: Goal: Knowledge of disease or condition will improve Outcome: Progressing Goal: Knowledge of the prescribed therapeutic regimen will improve Outcome: Progressing   Problem: Activity: Goal: Ability to tolerate increased activity will improve Outcome: Progressing   Problem: Respiratory: Goal: Ability to maintain a clear airway will improve Outcome: Progressing Goal: Ability to maintain adequate ventilation will improve Outcome: Progressing

## 2023-03-12 NOTE — Transfer of Care (Signed)
Immediate Anesthesia Transfer of Care Note  Patient: Raymond [name]  Procedure(s) Performed: THORACIC LAMINECTOMY AND  DISCECTOMY  Patient Location: PACU  Anesthesia Type:General  Level of Consciousness: awake, alert , and oriented  Airway & Oxygen Therapy: Patient Spontanous Breathing and Patient connected to face mask oxygen  Post-op Assessment: Report given to RN and Post -op Vital signs reviewed and stable  Post vital signs: Reviewed  Last Vitals:  Vitals Value Taken Time  BP 170/77 03/12/23 1430  Temp    Pulse 88 03/12/23 1434  Resp 20 03/12/23 1434  SpO2 93 % 03/12/23 1434  Vitals shown include unfiled device data.  Last Pain:  Vitals:   03/12/23 1007  TempSrc: Oral  PainSc: 6          Complications: No notable events documented.

## 2023-03-12 NOTE — ED Notes (Signed)
ED TO INPATIENT HANDOFF REPORT  Name/Age/Gender Raymond [name] 57 y.o. male  Code Status    Code Status Orders  (From admission, onward)           Start     Ordered   03/12/23 0257  Full code  Continuous       Question:  By:  Answer:  Consent: discussion documented in EHR   03/12/23 0300           Code Status History     Date Active Date Inactive Code Status Order ID Comments User Context   12/24/2022 1508 01/12/2023 1804 Full Code [id number]  Nolberto Hanlon, MD Inpatient   06/28/2014 1317 06/28/2014 1740 Full Code [id number]  Margart Sickles, PA-C Inpatient       Home/SNF/Other Home  Chief Complaint HNP (herniated nucleus pulposus with myelopathy), thoracic [M51.04]  Level of Care/Admitting Diagnosis ED Disposition     ED Disposition  Admit   Condition  --   Comment  Hospital Area: [facility] [[id number]]  Level of Care: Med-Surg [16]  [facility] admit patient to [facility] or [facility] if equivalent level of care is available:: No  Covid Evaluation: Asymptomatic - no recent exposure (last 10 days) testing not required  Diagnosis: HNP (herniated nucleus pulposus with myelopathy), thoracic [[id number]]  Admitting Physician: Coletta Memos [[id number]]  Attending Physician: Coletta Memos [[id number]]  Bed request comments: 4np  Certification:: I certify this patient will need inpatient services for at least 2 midnights  Expected Medical Readiness: 03/18/2023          Medical History Past Medical History:  Diagnosis Date   Anxiety    COPD (chronic obstructive pulmonary disease) (HCC)    Diabetes mellitus without complication (HCC)    Edema    GERD (gastroesophageal reflux disease)    Gout    Heavy smoker    Hyperlipidemia    Hypertension    Sleep apnea    uses a cpap    Allergies Allergies  Allergen Reactions   Sulfa Antibiotics Hives    IV Location/Drains/Wounds Patient Lines/Drains/Airways Status     Active Line/Drains/Airways     Name  Placement date Placement time Site Days   Peripheral IV 03/11/23 20 G Posterior;Right Forearm 03/11/23  2057  Forearm  1   Urethral Catheter Lanora Manis, RN Latex 16 Fr. 03/12/23  0102  Latex  less than 1            Labs/Imaging Results for orders placed or performed during the hospital encounter of 03/11/23 (from the past 48 hour(s))  CBC with Differential     Status: Abnormal   Collection Time: 03/11/23  4:20 PM  Result Value Ref Range   WBC 12.6 (H) 4.0 - 10.5 K/uL   RBC 4.03 (L) 4.22 - 5.81 MIL/uL   Hemoglobin 12.3 (L) 13.0 - 17.0 g/dL   HCT 66.4 (L) 40.3 - 47.4 %   MCV 92.1 80.0 - 100.0 fL   MCH 30.5 26.0 - 34.0 pg   MCHC 33.2 30.0 - 36.0 g/dL   RDW 25.9 (H) 56.3 - 87.5 %   Platelets 290 150 - 400 K/uL   nRBC 0.0 0.0 - 0.2 %   Neutrophils Relative % 72 %   Neutro Abs 9.3 (H) 1.7 - 7.7 K/uL   Lymphocytes Relative 16 %   Lymphs Abs 2.0 0.7 - 4.0 K/uL   Monocytes Relative 7 %   Monocytes Absolute 0.8 0.1 - 1.0 K/uL   Eosinophils Relative  3 %   Eosinophils Absolute 0.3 0.0 - 0.5 K/uL   Basophils Relative 1 %   Basophils Absolute 0.1 0.0 - 0.1 K/uL   Immature Granulocytes 1 %   Abs Immature Granulocytes 0.09 (H) 0.00 - 0.07 K/uL    Comment: Performed at [facility], [address], [place], [place] [address]  Basic metabolic panel     Status: Abnormal   Collection Time: 03/11/23  4:20 PM  Result Value Ref Range   Sodium 140 135 - 145 mmol/L   Potassium 3.3 (L) 3.5 - 5.1 mmol/L   Chloride 107 98 - 111 mmol/L   CO2 27 22 - 32 mmol/L   Glucose, Bld 153 (H) 70 - 99 mg/dL    Comment: Glucose reference range applies only to samples taken after fasting for at least 8 hours.   BUN 40 (H) 6 - 20 mg/dL   Creatinine, Ser 6.43 (H) 0.61 - 1.24 mg/dL   Calcium 9.1 8.9 - 32.9 mg/dL   GFR, Estimated 48 (L) >60 mL/min    Comment: (NOTE) Calculated using the CKD-EPI Creatinine Equation (2021)    Anion gap 6 5 - 15    Comment: Performed at [facility], [address] [place], [place] [address]  CBG monitoring, ED     Status: Abnormal   Collection Time: 03/12/23  4:05 AM  Result Value Ref Range   Glucose-Capillary 112 (H) 70 - 99 mg/dL    Comment: Glucose reference range applies only to samples taken after fasting for at least 8 hours.  CBC     Status: Abnormal   Collection Time: 03/12/23  6:20 AM  Result Value Ref Range   WBC 13.3 (H) 4.0 - 10.5 K/uL   RBC 3.98 (L) 4.22 - 5.81 MIL/uL   Hemoglobin 11.9 (L) 13.0 - 17.0 g/dL   HCT 16.6 (L) 06.3 - 01.6 %   MCV 92.5 80.0 - 100.0 fL   MCH 29.9 26.0 - 34.0 pg   MCHC 32.3 30.0 - 36.0 g/dL   RDW 01.0 (H) 93.2 - 35.5 %   Platelets 289 150 - 400 K/uL   nRBC 0.0 0.0 - 0.2 %    Comment: Performed at [facility], [address] [place], [place] [address]  Creatinine, serum     Status: Abnormal   Collection Time: 03/12/23  6:20 AM  Result Value Ref Range   Creatinine, Ser 1.35 (H) 0.61 - 1.24 mg/dL   GFR, Estimated >25 >42 mL/min    Comment: (NOTE) Calculated using the CKD-EPI Creatinine Equation (2021) Performed at [facility], [address], [place], [place] [address]   APTT     Status: None   Collection Time: 03/12/23  6:20 AM  Result Value Ref Range   aPTT 24 24 - 36 seconds    Comment: Performed at [facility], [address] [place], [place] [address]  Protime-INR     Status: None   Collection Time: 03/12/23  6:20 AM  Result Value Ref Range   Prothrombin Time 13.2 11.4 - 15.2 seconds   INR 1.0 0.8 - 1.2    Comment: (NOTE) INR goal varies based on device and disease states. Performed at [facility], [address] [place], [place] [address]    MR Lumbar Spine W Wo Contrast  Result Date: 03/12/2023 CLINICAL DATA:  Initial evaluation for acute myelopathy, incontinence, recent fall. EXAM: MRI THORACIC AND LUMBAR SPINE WITHOUT AND  WITH CONTRAST TECHNIQUE: Multiplanar and multiecho  pulse sequences of the thoracic and lumbar spine were obtained without and with intravenous contrast. CONTRAST:  10mL GADAVIST GADOBUTROL 1 MMOL/ML IV SOLN COMPARISON:  Prior CT from 12/25/2022 as well as prior MRI. FINDINGS: MRI THORACIC SPINE FINDINGS Alignment: Vertebral bodies normally aligned with preservation of the normal thoracic kyphosis. Trace 2 mm anterolisthesis of T10 on T11. Vertebrae: Vertebral body height maintained without acute or chronic fracture. Bone marrow signal intensity within normal limits. No worrisome osseous lesions. Degenerative reactive endplate changes present about the T8-9 and T10-11 interspaces. No other abnormal marrow edema. 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 Otherwise normal signal and morphology. Paraspinal and other soft tissues: Unremarkable. Disc levels: T8-9: Advanced degenerative intervertebral disc space narrowing with disc desiccation and diffuse disc bulge. Mild facet hypertrophy. No significant spinal stenosis. Moderate to severe bilateral foraminal narrowing. T9-10: Disc desiccation with minimal disc bulge. Bilateral facet hypertrophy. No significant spinal stenosis. Moderate bilateral foraminal narrowing. T10-11: Trace anterolisthesis. Diffuse disc bulge with disc desiccation. Associated reactive endplate changes. Moderate sized lobulated left subarticular to foraminal disc protrusion (series 20, image 33). Superimposed bilateral facet arthrosis. Resultant severe spinal stenosis. Cord is compressed and deviated to the right. Associated cord signal changes concerning for edema and/or myelomalacia. T11-12: Degenerate intervertebral disc space narrowing with diffuse disc bulge. Mild reactive endplate change. Mild facet hypertrophy. No significant spinal stenosis. Mild bilateral foraminal narrowing. Otherwise, minor for age spondylosis elsewhere within the  thoracic spine. No other significant stenosis or impingement. From 12/31/2022 MRI LUMBAR SPINE FINDINGS Segmentation:  Standard. Alignment: 3 mm retrolisthesis of L3 on L4. Alignment otherwise normal preservation of the normal lumbar lordosis. Vertebrae: Vertebral body height maintained without acute or interval fracture. Bone marrow signal intensity within normal limits. Few small benign hemangiomata noted. No worrisome osseous lesions. Endplate Schmorl's node deformities with mild reactive endplate change noted about the L4-5 interspace. Mild reactive marrow edema about the L4-5 facets due to facet arthritis. Conus medullaris: Extends to the T12 level and appears normal. Minimal soft tissue edema about the L4-5 facets, reactive in nature due to facet arthritis. 7 mm simple left renal cyst, benign in appearance, no follow-up imaging recommended. Paraspinal and other soft tissues: Mild soft tissue edema adjacent to the L4-5 facets, likely reactive due to facet arthritis. 6 mm simple left renal cyst, benign in appearance, no follow-up imaging recommended. Disc levels: L1-2: Normal interspace. Mild right greater left facet hypertrophy. No spinal stenosis. Mild left L1 foraminal narrowing. Right neural foramen remains patent. L2-3: Mild disc bulge with bilateral facet spurring. No spinal stenosis. No significant foraminal narrowing. L3-4: Diffuse disc bulge with disc desiccation. Mild bilateral facet hypertrophy. No significant spinal stenosis. Mild bilateral L3 foraminal narrowing. L4-5: Diffuse disc bulge with disc desiccation. Superimposed central to left subarticular disc protrusion (series 4, images 30, 31). Moderately advanced facet arthrosis with associated small joint effusions. Probable small associated synovial cysts noted at the posterior aspects of the L4-5 facets bilaterally. Resultant mild canal, with severe left and moderate right lateral recess stenosis. Severe bilateral L4 foraminal narrowing. L5-S1:  Minimal disc bulge. Moderate facet hypertrophy. Mild bilateral L5 foraminal stenosis. No spinal stenosis. IMPRESSION: MRI THORACIC SPINE IMPRESSION: 1. Moderate-sized lobulated left subarticular to foraminal disc protrusion at T10-11 with resultant severe spinal stenosis. Cord is compressed and deviated to the right at this level. Associated cord signal changes concerning for edema  and/or myelomalacia. 2. No other acute abnormality within the thoracic spine. 3. Degenerative spondylosis at T8-9 through T11-12 without significant spinal stenosis. Associated moderate to severe bilateral foraminal narrowing at these levels as above. MRI LUMBAR SPINE IMPRESSION: 1. No acute abnormality within the lumbar spine. 2. Multifactorial degenerative changes at L4-5 with resultant mild canal with severe left and moderate right lateral recess stenosis, with severe bilateral L4 foraminal narrowing. 3. Additional mild noncompressive disc bulging and facet hypertrophy elsewhere within the lumbar spine as above. No other significant stenosis or frank neural impingement. Critical Value/emergent results were called by telephone at the time of interpretation on 03/12/2023 at 12:41 am to provider Dr. Madilyn Hook, who verbally acknowledged these results. Electronically Signed   By: Rise Mu M.D.   On: 03/12/2023 00:46   MR THORACIC SPINE W WO CONTRAST  Result Date: 03/12/2023 CLINICAL DATA:  Initial evaluation for acute myelopathy, incontinence, recent fall. EXAM: MRI THORACIC AND LUMBAR SPINE WITHOUT AND WITH CONTRAST TECHNIQUE: Multiplanar and multiecho pulse sequences of the thoracic and lumbar spine were obtained without and with intravenous contrast. CONTRAST:  10mL GADAVIST GADOBUTROL 1 MMOL/ML IV SOLN COMPARISON:  Prior CT from 12/25/2022 as well as prior MRI. FINDINGS: MRI THORACIC SPINE FINDINGS Alignment: Vertebral bodies normally aligned with preservation of the normal thoracic kyphosis. Trace 2 mm anterolisthesis of  T10 on T11. Vertebrae: Vertebral body height maintained without acute or chronic fracture. Bone marrow signal intensity within normal limits. No worrisome osseous lesions. Degenerative reactive endplate changes present about the T8-9 and T10-11 interspaces. No other abnormal marrow edema. 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 Otherwise normal signal and morphology. Paraspinal and other soft tissues: Unremarkable. Disc levels: T8-9: Advanced degenerative intervertebral disc space narrowing with disc desiccation and diffuse disc bulge. Mild facet hypertrophy. No significant spinal stenosis. Moderate to severe bilateral foraminal narrowing. T9-10: Disc desiccation with minimal disc bulge. Bilateral facet hypertrophy. No significant spinal stenosis. Moderate bilateral foraminal narrowing. T10-11: Trace anterolisthesis. Diffuse disc bulge with disc desiccation. Associated reactive endplate changes. Moderate sized lobulated left subarticular to foraminal disc protrusion (series 20, image 33). Superimposed bilateral facet arthrosis. Resultant severe spinal stenosis. Cord is compressed and deviated to the right. Associated cord signal changes concerning for edema and/or myelomalacia. T11-12: Degenerate intervertebral disc space narrowing with diffuse disc bulge. Mild reactive endplate change. Mild facet hypertrophy. No significant spinal stenosis. Mild bilateral foraminal narrowing. Otherwise, minor for age spondylosis elsewhere within the thoracic spine. No other significant stenosis or impingement. From 12/31/2022 MRI LUMBAR SPINE FINDINGS Segmentation:  Standard. Alignment: 3 mm retrolisthesis of L3 on L4. Alignment otherwise normal preservation of the normal lumbar lordosis. Vertebrae: Vertebral body height maintained without acute or interval fracture. Bone marrow signal intensity within normal limits. Few small  benign hemangiomata noted. No worrisome osseous lesions. Endplate Schmorl's node deformities with mild reactive endplate change noted about the L4-5 interspace. Mild reactive marrow edema about the L4-5 facets due to facet arthritis. Conus medullaris: Extends to the T12 level and appears normal. Minimal soft tissue edema about the L4-5 facets, reactive in nature due to facet arthritis. 7 mm simple left renal cyst, benign in appearance, no follow-up imaging recommended. Paraspinal and other soft tissues: Mild soft tissue edema adjacent to the L4-5 facets, likely reactive due to facet arthritis. 6 mm simple left renal cyst, benign in appearance, no follow-up imaging recommended. Disc levels: L1-2: Normal interspace. Mild right greater  left facet hypertrophy. No spinal stenosis. Mild left L1 foraminal narrowing. Right neural foramen remains patent. L2-3: Mild disc bulge with bilateral facet spurring. No spinal stenosis. No significant foraminal narrowing. L3-4: Diffuse disc bulge with disc desiccation. Mild bilateral facet hypertrophy. No significant spinal stenosis. Mild bilateral L3 foraminal narrowing. L4-5: Diffuse disc bulge with disc desiccation. Superimposed central to left subarticular disc protrusion (series 4, images 30, 31). Moderately advanced facet arthrosis with associated small joint effusions. Probable small associated synovial cysts noted at the posterior aspects of the L4-5 facets bilaterally. Resultant mild canal, with severe left and moderate right lateral recess stenosis. Severe bilateral L4 foraminal narrowing. L5-S1: Minimal disc bulge. Moderate facet hypertrophy. Mild bilateral L5 foraminal stenosis. No spinal stenosis. IMPRESSION: MRI THORACIC SPINE IMPRESSION: 1. Moderate-sized lobulated left subarticular to foraminal disc protrusion at T10-11 with resultant severe spinal stenosis. Cord is compressed and deviated to the right at this level. Associated cord signal changes concerning for edema  and/or myelomalacia. 2. No other acute abnormality within the thoracic spine. 3. Degenerative spondylosis at T8-9 through T11-12 without significant spinal stenosis. Associated moderate to severe bilateral foraminal narrowing at these levels as above. MRI LUMBAR SPINE IMPRESSION: 1. No acute abnormality within the lumbar spine. 2. Multifactorial degenerative changes at L4-5 with resultant mild canal with severe left and moderate right lateral recess stenosis, with severe bilateral L4 foraminal narrowing. 3. Additional mild noncompressive disc bulging and facet hypertrophy elsewhere within the lumbar spine as above. No other significant stenosis or frank neural impingement. Critical Value/emergent results were called by telephone at the time of interpretation on 03/12/2023 at 12:41 am to provider Dr. Madilyn Hook, who verbally acknowledged these results. Electronically Signed   By: Rise Mu M.D.   On: 03/12/2023 00:46    Pending Labs Unresulted Labs (From admission, onward)     Start     Ordered   03/12/23 0255  Hemoglobin A1c  Once,   R       Comments: To assess prior glycemic control    03/12/23 0300            Vitals/Pain Today's Vitals   03/12/23 0200 03/12/23 0400 03/12/23 0433 03/12/23 0511  BP: (!) 156/76 (!) 160/85  (!) 148/73  Pulse: 83 96  73  Resp:    16  Temp:    98.8 F (37.1 C)  TempSrc:    Oral  SpO2: 92% 94%  98%  Weight:      Height:      PainSc:   Asleep     Isolation Precautions No active isolations  Medications Medications  allopurinol (ZYLOPRIM) tablet 300 mg (has no administration in time range)  amLODipine (NORVASC) tablet 10 mg (has no administration in time range)  atorvastatin (LIPITOR) tablet 40 mg (has no administration in time range)  bumetanide (BUMEX) tablet 4 mg (has no administration in time range)  carvedilol (COREG) tablet 25 mg (has no administration in time range)  doxazosin (CARDURA) tablet 2 mg (has no administration in time range)   furosemide (LASIX) tablet 80 mg (has no administration in time range)  losartan (COZAAR) tablet 50 mg (has no administration in time range)  DULoxetine (CYMBALTA) DR capsule 60 mg (has no administration in time range)  glipiZIDE (GLUCOTROL) tablet 10 mg (has no administration in time range)  metFORMIN (GLUCOPHAGE-XR) 24 hr tablet 1,000 mg (has no administration in time range)  pantoprazole (PROTONIX) EC tablet 80 mg (has no administration in time range)  tamsulosin (FLOMAX) capsule 0.4 mg (  has no administration in time range)  pramipexole (MIRAPEX) tablet 0.5 mg (has no administration in time range)  potassium chloride (KLOR-CON) CR tablet 30 mEq (0 mEq Oral Hold 03/12/23 0625)  insulin aspart (novoLOG) injection 0-20 Units ( Subcutaneous Not Given 03/12/23 0407)  heparin injection 5,000 Units (5,000 Units Subcutaneous Given 03/12/23 0600)  0.9 % NaCl with KCl 20 mEq/ L  infusion ( Intravenous New Bag/Given 03/12/23 0445)  acetaminophen (TYLENOL) tablet 650 mg (has no administration in time range)    Or  acetaminophen (TYLENOL) suppository 650 mg (has no administration in time range)  oxyCODONE (Oxy IR/ROXICODONE) immediate release tablet 5 mg (has no administration in time range)  HYDROmorphone (DILAUDID) injection 0.5-1 mg (1 mg Intravenous Given 03/12/23 0336)  magnesium citrate solution 1 Bottle (has no administration in time range)  ondansetron (ZOFRAN) tablet 4 mg (has no administration in time range)    Or  ondansetron (ZOFRAN) injection 4 mg (has no administration in time range)  albuterol (PROVENTIL) (2.5 MG/3ML) 0.083% nebulizer solution 2.5 mg (has no administration in time range)  HYDROcodone-acetaminophen (NORCO/VICODIN) 5-325 MG per tablet 2 tablet (2 tablets Oral Given 03/11/23 1659)  cyclobenzaprine (FLEXERIL) tablet 5 mg (5 mg Oral Given 03/11/23 1659)  LORazepam (ATIVAN) injection 1 mg (1 mg Intravenous Given 03/11/23 2121)  HYDROmorphone (DILAUDID) injection 1 mg (1 mg  Intravenous Given 03/12/23 0105)  gadobutrol (GADAVIST) 1 MMOL/ML injection 10 mL (10 mLs Intravenous Contrast Given 03/11/23 2218)    Mobility walks with device

## 2023-03-12 NOTE — ED Notes (Signed)
[facility] contacted to transport patient

## 2023-03-12 NOTE — Anesthesia Procedure Notes (Signed)
Procedure Name: Intubation Date/Time: 03/12/2023 11:39 AM  Performed by: Heron Sabins, CRNAPre-anesthesia Checklist: Patient identified, Emergency Drugs available, Suction available and Patient being monitored Patient Re-evaluated:Patient Re-evaluated prior to induction Oxygen Delivery Method: Circle System Utilized Preoxygenation: Pre-oxygenation with 100% oxygen Induction Type: IV induction Ventilation: Mask ventilation without difficulty Tube type: Oral Number of attempts: 1 Airway Equipment and Method: Stylet and Oral airway Placement Confirmation: ETT inserted through vocal cords under direct vision, positive ETCO2 and breath sounds checked- equal and bilateral Secured at: 22 cm Tube secured with: Tape Dental Injury: Teeth and Oropharynx as per pre-operative assessment

## 2023-03-12 NOTE — Anesthesia Preprocedure Evaluation (Addendum)
Anesthesia Evaluation  Patient identified by MRN, date of birth, ID band Patient awake    Reviewed: Allergy & Precautions, NPO status , Patient's Chart, lab work & pertinent test results, reviewed documented beta blocker date and time   Airway Mallampati: IV  TM Distance: >3 FB Neck ROM: Full    Dental  (+) Teeth Intact, Dental Advisory Given   Pulmonary sleep apnea and Continuous Positive Airway Pressure Ventilation , COPD, Current Smoker and Patient abstained from smoking. 2ppd  Uses albuterol 1-2x/wk   Pulmonary exam normal breath sounds clear to auscultation       Cardiovascular hypertension (187/84 preop, per pt normally 150s SBP), Pt. on medications and Pt. on home beta blockers Normal cardiovascular exam Rhythm:Regular Rate:Normal     Neuro/Psych  PSYCHIATRIC DISORDERS Anxiety     negative neurological ROS     GI/Hepatic Neg liver ROS,GERD  Controlled and Medicated,,  Endo/Other  diabetes, Well Controlled, Type 2, Oral Hypoglycemic Agents  Morbid obesityBMI 44  Renal/GU Renal InsufficiencyRenal diseaseCr 1.35  negative genitourinary   Musculoskeletal long history of back pain. Larey Seat on past Sunday and since has had increased pain in his lower back, problems walking, controlling his urination and bowel movements. 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   Abdominal  (+) + obese  Peds  Hematology  (+) Blood dyscrasia, anemia Hb 11.9   Anesthesia Other Findings Mounjaro LD: yesterday   Reproductive/Obstetrics negative OB ROS                              Anesthesia Physical Anesthesia Plan  ASA: 3  Anesthesia Plan: General   Post-op Pain Management: Tylenol PO (pre-op)* and Ketamine IV*   Induction: Intravenous  PONV Risk Score and Plan: 1 and Ondansetron, Dexamethasone, Midazolam and Treatment Bussie vary due to age or medical condition  Airway Management Planned: Oral ETT  Additional  Equipment: None  Intra-op Plan:   Post-operative Plan: Extubation in OR  Informed Consent: I have reviewed the patients History and Physical, chart, labs and discussed the procedure including the risks, benefits and alternatives for the proposed anesthesia with the patient or authorized representative who has indicated his/her understanding and acceptance.     Dental advisory given  Plan Discussed with: CRNA  Anesthesia Plan Comments:          Anesthesia Quick Evaluation

## 2023-03-12 NOTE — ED Notes (Signed)
Pt attempted to urinate but was unable to produce anything, pt reports feeling the need to go but cannot pee.

## 2023-03-12 NOTE — Anesthesia Postprocedure Evaluation (Signed)
Anesthesia Post Note  Patient: Raymond [name]  Procedure(s) Performed: THORACIC LAMINECTOMY AND  DISCECTOMY     Patient location during evaluation: PACU Anesthesia Type: General Level of consciousness: awake and alert, oriented and patient cooperative Pain management: pain level controlled Vital Signs Assessment: post-procedure vital signs reviewed and stable Respiratory status: spontaneous breathing, nonlabored ventilation and respiratory function stable Cardiovascular status: blood pressure returned to baseline and stable Postop Assessment: no apparent nausea or vomiting Anesthetic complications: no   No notable events documented.  Last Vitals:  Vitals:   03/12/23 1007 03/12/23 1445  BP: (!) 184/98 (!) 166/75  Pulse: 81 97  Resp: 18 19  Temp: 36.8 C   SpO2: 97% 100%    Last Pain:  Vitals:   03/12/23 1007  TempSrc: Oral  PainSc: 6                  Lannie Fields

## 2023-03-12 NOTE — ED Notes (Signed)
Per Colon Branch, surgical RN at [facility], pt can have his oral amlodipine, carvedilol, protonix and prampexole  Hold all other meds  She also requested a glucose check before he is sent over.

## 2023-03-12 NOTE — ED Provider Notes (Signed)
Patient care assumed at 2300.  Patient here for evaluation of acute on chronic low back pain, fecal and urinary incontinence.  Care assumed pending MRI.  MRI is concerning for acute cord compression at T10/11.  Discussed with Dr. Franky Macho with neurosurgery-will see the patient in consult.  Patient and wife at the bedside updated of findings of studies and recommendation for admission and they are in agreement with admission for ongoing care.  CRITICAL CARE Performed by: Tilden Fossa   Total critical care time: 32 minutes  Critical care time was exclusive of separately billable procedures and treating other patients.  Critical care was necessary to treat or prevent imminent or life-threatening deterioration.  Critical care was time spent personally by me on the following activities: development of treatment plan with patient and/or surrogate as well as nursing, discussions with consultants, evaluation of patient's response to treatment, examination of patient, obtaining history from patient or surrogate, ordering and performing treatments and interventions, ordering and review of laboratory studies, ordering and review of radiographic studies, pulse oximetry and re-evaluation of patient's condition.    Tilden Fossa, MD 03/13/23 [phone]

## 2023-03-13 ENCOUNTER — Encounter (HOSPITAL_COMMUNITY): Payer: Self-pay | Admitting: Neurosurgery

## 2023-03-13 LAB — GLUCOSE, CAPILLARY
Glucose-Capillary: 119 mg/dL — ABNORMAL HIGH (ref 70–99)
Glucose-Capillary: 132 mg/dL — ABNORMAL HIGH (ref 70–99)
Glucose-Capillary: 88 mg/dL (ref 70–99)
Glucose-Capillary: 89 mg/dL (ref 70–99)

## 2023-03-13 MED ORDER — CHLORHEXIDINE GLUCONATE CLOTH 2 % EX PADS
6.0000 | MEDICATED_PAD | Freq: Every day | CUTANEOUS | Status: DC
  Administered 2023-03-13 – 2023-03-23 (×11): 6 via TOPICAL

## 2023-03-13 MED FILL — Thrombin For Soln 5000 Unit: CUTANEOUS | Qty: 2 | Status: AC

## 2023-03-13 NOTE — Progress Notes (Signed)
  Inpatient Rehab Admissions Coordinator :  Per therapy recommendations patient was screened for CIR candidacy by Ottie Glazier RN MSN. Patient is not at a level to tolerate the intensity required to pursue a CIR admit. Recent SNF at [facility] 8/24. History of falls.The CIR admissions team will follow and monitor for progress and place a Rehab Consult order if felt to be appropriate. Please contact me with any questions.  Ottie Glazier RN MSN Admissions Coordinator [phone]

## 2023-03-13 NOTE — Progress Notes (Signed)
Patient ID: Raymond [name], male   DOB: 08/20/1965, 57 y.o.   MRN: [id number] BP (!) 148/85   Pulse 78   Temp 98.6 F (37 C) (Oral)   Resp 13   Ht 5\' 11"  (1.803 m)   Wt (!) 144.2 kg   SpO2 92%   BMI 44.35 kg/m  Working with physical therapy. Wound is clean, dry, without signs of infection Significant pain, not unexpected. Expect continued improvement

## 2023-03-13 NOTE — Evaluation (Signed)
Physical Therapy Evaluation Patient Details Name: Raymond [name] Moist MRN: [id number] DOB: 03-20-1966 Today's Date: 03/13/2023  History of Present Illness  Patient is a 57 y.o. 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 He is now s/p T10 laminectomy and Discectomy. PMH of anxiety, COPD, DM, Edema, Gout, HTN, HLD, L medial menisectomy, ORIF L radius and ulna.   Clinical Impression  Raymond [name] is 57 y.o. male admitted with above HPI and diagnosis. Patient is currently limited by functional impairments below (see PT problem list). Patient lives with spouse and has been limited to [facility] mobility requiring assist for slide board transfers since ~August. Pt has significant bil LE weakness and hypertonicity in hamstrings and garstoc complex Rt>Lt with +babinski and bil feet resting in platarflexed posture with great toe hyperextended and remaining digits splayed. Sensation impaired and inconsistent to light touch on bil LE's. Currently pt requires mod-max +2 assist from bed mobility with cues to sequence log roll technique. Pt able to keep balance at EOB with bil UE support and attempted sit<>stand 2x with Stedy but unable to achieve full rise with +2 assist. Maximove sling placed and total assist transfer completed for bed>chair. Patient will benefit from continued skilled PT interventions to address impairments and progress independence with mobility. Patient will benefit from intensive inpatient follow up therapy, >3 hours/day. Acute PT will follow and progress as able.         If plan is discharge home, recommend the following: Two people to help with walking and/or transfers;Two people to help with bathing/dressing/bathroom;Assistance with cooking/housework;Direct supervision/assist for medications management;Assist for transportation;Help with stairs or ramp for entrance   Can travel by private vehicle         Equipment Recommendations Hospital bed;Wheelchair cushion (measurements PT);Wheelchair (measurements PT);Hoyer lift (TBD (power wc))  Recommendations for Other Services  Rehab consult    Functional Status Assessment Patient has had a recent decline in their functional status and demonstrates the ability to make significant improvements in function in a reasonable and predictable amount of time.     Precautions / Restrictions Precautions Precautions: Fall Restrictions Weight Bearing Restrictions: No      Mobility  Bed Mobility Overal bed mobility: Needs Assistance Bed Mobility: Rolling, Sidelying to Sit Rolling: Mod assist, +2 for safety/equipment, Used rails Sidelying to sit: Mod assist, Max assist, +2 for physical assistance, +2 for safety/equipment, HOB elevated, Used rails       General bed mobility comments: cues for log roll technique and pt initiated reachign Lt UE to rail and mod assist to flex Lt LE. pt able to initiate Lt LE towards EOB but max assist to bring Rt LE and Max +2 to fully press up trunk and obtain seated balance.    Transfers Overall transfer level: Needs assistance   Transfers: Sit to/from Stand, Bed to chair/wheelchair/BSC Sit to Stand: Max assist, +2 physical assistance, +2 safety/equipment, From elevated surface, Via lift equipment           General transfer comment: Stedy for partial sit<>stand from elevated EOB, compelted 2x with maximove sling placement on second. Maximove lift to transfer [employer] chair. Chair reclined (tilted). Transfer via Lift Equipment: Dellia Cloud  Ambulation/Gait                  Careers information officer  Tilt Bed    Modified Rankin (Stroke Patients Only)       Balance Overall balance assessment: Needs assistance Sitting-balance support: Feet supported, Bilateral upper extremity supported Sitting balance-Leahy Scale: Fair     Standing balance support: Bilateral  upper extremity supported, During functional activity, Reliant on assistive device for balance Standing balance-Leahy Scale: Poor                               Pertinent Vitals/Pain Pain Assessment Pain Assessment: Faces Faces Pain Scale: Hurts even more Pain Location: back Pain Descriptors / Indicators: Aching, Discomfort Pain Intervention(s): Limited activity within patient's tolerance, Monitored during session, Repositioned, Premedicated before session    Home Living Family/patient expects to be discharged to:: Private residence Living Arrangements: Spouse/significant other Available Help at Discharge: Family;Available 24 hours/day Type of Home: House Home Access: Ramped entrance       Home Layout: One level Home Equipment: Agricultural consultant (2 wheels);BSC/3in1;Wheelchair - manual;Other (comment);Rollator (4 wheels) Additional Comments: pt has been limited to [facility] mobility since hospital admission in July and dc'd to August to SNF for 2 weeks of rehab. Since returning home pt has worked with [facility] therapy on transfers, has been able to stand for ~3 mins on some visits with HHPT. Patient naps in recliner but sleeps in regular flat bed, uses CPAP when sleeping. Sunday of fall pt was trying to stand alone while spouse out of home.    Prior Function Prior Level of Function : Needs assist       Physical Assist : Mobility (physical);ADLs (physical)     Mobility Comments: since ~July pt has been using WC for mobility, propelling with bil UE or Bil LE. pt uses slide board and assist from spouse for bed<>WC transfers, was able to do it alone after SNF stay but requires increased assistance now. ADLs Comments: uses bari [facility] with side platform to transfer WC<>BSC for toileting, bed baths with assist from spouse. pt able to feed self and perform oral care and shave.     Extremity/Trunk Assessment   Upper Extremity Assessment Upper Extremity Assessment: Overall WFL for tasks  assessed    Lower Extremity Assessment Lower Extremity Assessment: RLE deficits/detail;LLE deficits/detail RLE Deficits / Details: pt with hypertonicity in hamstrngs and gastroc complex, strong babinski (+) and clonus Rt>Lt. strength testing limited by tone, grossly 2/5 or less. Rt foot resting in significant plantarflexion and inversion with hypextension of great toe. RLE Sensation: decreased light touch;decreased proprioception RLE Coordination: decreased gross motor;decreased fine motor LLE Deficits / Details: pt with hypertonicity in hamstrings and gastroc complex, strong babinski (+) and clonus Rt>Lt. strength testing limited by tone, grossly 2/5 or less. Lt foot resting in significant plantarflexion and inversion with hypextension of great toe. LLE Sensation: decreased light touch;decreased proprioception LLE Coordination: decreased gross motor;decreased fine motor    Cervical / Trunk Assessment Cervical / Trunk Assessment: Other exceptions;Back Surgery Cervical / Trunk Exceptions: habitus  Communication   Communication Communication: No apparent difficulties  Cognition Arousal: Alert Behavior During Therapy: WFL for tasks assessed/performed Overall Cognitive Status: Within Functional Limits for tasks assessed                                          General Comments      Exercises     Assessment/Plan    PT  Assessment Patient needs continued PT services  PT Problem List Decreased strength;Decreased range of motion;Decreased balance;Decreased activity tolerance;Decreased mobility;Decreased coordination;Decreased cognition;Decreased knowledge of use of DME;Decreased safety awareness;Decreased knowledge of precautions;Obesity;Pain;Impaired sensation;Impaired tone;Cardiopulmonary status limiting activity       PT Treatment Interventions DME instruction;Gait training;Stair training;Functional mobility training;Therapeutic activities;Therapeutic exercise;Balance  training;Neuromuscular re-education;Cognitive remediation;Patient/family education;Wheelchair mobility training;Manual techniques    PT Goals (Current goals can be found in the Care Plan section)  Acute Rehab PT Goals Patient Stated Goal: rehab here PT Goal Formulation: With patient Time For Goal Achievement: 03/27/23 Potential to Achieve Goals: Good    Frequency Min 1X/week     Co-evaluation               AM-PAC PT "6 Clicks" Mobility  Outcome Measure Help needed turning from your back to your side while in a flat bed without using bedrails?: Total Help needed moving from lying on your back to sitting on the side of a flat bed without using bedrails?: Total Help needed moving to and from a bed to a chair (including a wheelchair)?: Total Help needed standing up from a chair using your arms (e.g., wheelchair or bedside chair)?: Total Help needed to walk in hospital room?: Total Help needed climbing 3-5 steps with a railing? : Total 6 Click Score: 6    End of Session   Activity Tolerance: Patient tolerated treatment well Patient left: in chair;with call bell/phone within reach;with chair alarm set (Striker/Sizewize bari chair) Nurse Communication: Mobility status;Need for lift equipment (+2 with maximove) PT Visit Diagnosis: Other abnormalities of gait and mobility (R26.89);Muscle weakness (generalized) (M62.81);Difficulty in walking, not elsewhere classified (R26.2);Other symptoms and signs involving the nervous system (R29.898);Pain Pain - part of body:  (back)    Time: 4098-1191 PT Time Calculation (min) (ACUTE ONLY): 65 min   Charges:   PT Evaluation $PT Eval Moderate Complexity: 1 Mod PT Treatments $Therapeutic Activity: 8-22 mins PT General Charges $$ ACUTE PT VISIT: 1 Visit         Wynn Maudlin, DPT Acute Rehabilitation Services Office [phone]  03/13/23 3:06 PM

## 2023-03-13 NOTE — Progress Notes (Signed)
2055 pt. Asked RN for CPAP at bedtime. RN called Neurosurgery on call for order.  2110 Dr. Maisie Fus gave RN verbal order for CPAP.

## 2023-03-13 NOTE — Progress Notes (Signed)
PT/OT ordered for pt - pt with order for bedrest and no order for brace.  Message left for Dr. Franky Macho for clarification.  Waiting for response.  Will continue to monitor pt.

## 2023-03-13 NOTE — Progress Notes (Signed)
MD order to discontinue bedrest and no back brace needed for ambulation.  PT/OT notified.

## 2023-03-13 NOTE — Evaluation (Signed)
Occupational Therapy Evaluation Patient Details Name: Raymond [name] MRN: [id number] DOB: 1966/01/09 Today's Date: 03/13/2023   History of Present Illness Patient is a 57 y.o. 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 He is now s/p T10 laminectomy and Discectomy. PMH of anxiety, COPD, DM, Edema, Gout, HTN, HLD, L medial menisectomy, ORIF L radius and ulna.   Clinical Impression   Pt admitted for above, presents with a lot of back pain but motivated to partake in therapy. Educated pt on POB precautions but needs reinforcement and handout, needs significant +2 assist from bed mobility and STS attempts. Unable to maintain standing but was receptive to transfer to recliner via maxi move. Pt needing Max to Total A for LB ADLs to which his wife reports was more than baseline. Pt would benefit from continued acute skilled OT services to address deficits, help transition to next level of care, and educate caregiver on strategies to reduce physical burden of care. Patient has the potential to reach Mod I and demos the ability to tolerate 3 hours of therapy. Pt would benefit from an intensive rehab program to help maximize functional independence.       If plan is discharge home, recommend the following: Two people to help with walking and/or transfers;A lot of help with bathing/dressing/bathroom;Assistance with cooking/housework    Functional Status Assessment  Patient has had a recent decline in their functional status and demonstrates the ability to make significant improvements in function in a reasonable and predictable amount of time.  Equipment Recommendations  [employer];Hospital bed    Recommendations for Other Services Rehab consult     Precautions / Restrictions Precautions Precautions: Fall Restrictions Weight Bearing Restrictions: No      Mobility Bed Mobility Overal bed mobility:  Needs Assistance Bed Mobility: Rolling, Sidelying to Sit Rolling: Mod assist, +2 for safety/equipment, Used rails Sidelying to sit: Mod assist, Max assist, +2 for physical assistance, +2 for safety/equipment, HOB elevated, Used rails       General bed mobility comments: cues for log roll technique and pt initiated reaching Lt UE to rail and mod assist to flex Lt LE. pt able to initiate Lt LE towards EOB but max assist to bring Rt LE and Max +2 to fully press up trunk and obtain seated balance.    Transfers Overall transfer level: Needs assistance   Transfers: Sit to/from Stand, Bed to chair/wheelchair/BSC Sit to Stand: Max assist, +2 physical assistance, +2 safety/equipment, From elevated surface, Via lift equipment           General transfer comment: Stedy for partial sit<>stand from elevated EOB, compelted 2x with maximove sling placement on second. Maximove lift to transfer [employer] chair. Chair reclined (tilted). Transfer via Lift Equipment: Dellia Cloud    Balance Overall balance assessment: Needs assistance Sitting-balance support: Feet supported, Bilateral upper extremity supported Sitting balance-Leahy Scale: Fair     Standing balance support: Bilateral upper extremity supported, During functional activity, Reliant on assistive device for balance Standing balance-Leahy Scale: Poor                             ADL either performed or assessed with clinical judgement   ADL Overall ADL's : Needs assistance/impaired Eating/Feeding: Independent;Sitting;Bed level   Grooming: Wash/dry face;Sitting;Set up   Upper Body Bathing: Minimal assistance;Sitting   Lower Body Bathing: Total  assistance;Sitting/lateral leans   Upper Body Dressing : Sitting;Minimal assistance   Lower Body Dressing: Sitting/lateral leans;Maximal assistance Lower Body Dressing Details (indicate cue type and reason): don shoes, increased challenge due to big toes being raised in  ext at rest Toilet Transfer: Total assistance;+2 for physical assistance Toilet Transfer Details (indicate cue type and reason): bed level Toileting- Clothing Manipulation and Hygiene: Total assistance;+2 for physical assistance         General ADL Comments: Reinforeced POB precautions, needs handout     Vision         Perception         Praxis         Pertinent Vitals/Pain Pain Assessment Pain Assessment: Faces Faces Pain Scale: Hurts even more Pain Location: back Pain Descriptors / Indicators: Aching, Discomfort Pain Intervention(s): Limited activity within patient's tolerance, Monitored during session, Repositioned, Premedicated before session     Extremity/Trunk Assessment Upper Extremity Assessment Upper Extremity Assessment: Overall WFL for tasks assessed   Lower Extremity Assessment Lower Extremity Assessment: RLE deficits/detail;LLE deficits/detail RLE Deficits / Details: pt with hypertonicity in hamstrngs and gastroc complex, strong babinski (+) and clonus Rt>Lt. strength testing limited by tone, grossly 2/5 or less. Rt foot resting in significant plantarflexion and inversion with hypextension of great toe. RLE Sensation: decreased light touch;decreased proprioception RLE Coordination: decreased gross motor;decreased fine motor LLE Deficits / Details: pt with hypertonicity in hamstrings and gastroc complex, strong babinski (+) and clonus Rt>Lt. strength testing limited by tone, grossly 2/5 or less. Lt foot resting in significant plantarflexion and inversion with hypextension of great toe. LLE Sensation: decreased light touch;decreased proprioception LLE Coordination: decreased gross motor;decreased fine motor   Cervical / Trunk Assessment Cervical / Trunk Assessment: Other exceptions;Back Surgery Cervical / Trunk Exceptions: habitus   Communication Communication Communication: No apparent difficulties   Cognition Arousal: Alert Behavior During Therapy: WFL  for tasks assessed/performed Overall Cognitive Status: Within Functional Limits for tasks assessed                                       General Comments  Increase in sweating and feeling hot while seated. Instructed pt to call for assist while in recliner if he feels unwell or nauseous, RN notified. BP stable    Exercises     Shoulder Instructions      Home Living Family/patient expects to be discharged to:: Private residence Living Arrangements: Spouse/significant other Available Help at Discharge: Family;Available 24 hours/day Type of Home: House Home Access: Ramped entrance     Home Layout: One level     Bathroom Shower/Tub: Chief Strategy Officer: Standard Bathroom Accessibility: No   Home Equipment: Agricultural consultant (2 wheels);BSC/3in1;Wheelchair - Careers adviser (comment);Rollator (4 wheels)   Additional Comments: pt has been limited to [facility] mobility since hospital admission in July and dc'd to August to SNF for 2 weeks of rehab. Since returning home pt has worked with [facility] therapy on transfers, has been able to stand for ~3 mins on some visits with HHPT. Patient naps in recliner but sleeps in regular flat bed, uses CPAP when sleeping. Sunday of fall pt was trying to stand alone while spouse out of home.      Prior Functioning/Environment Prior Level of Function : Needs assist       Physical Assist : Mobility (physical);ADLs (physical)     Mobility Comments: since ~July pt has been using  WC for mobility, propelling with bil UE or Bil LE. pt uses slide board and assist from spouse for bed<>WC transfers, was able to do it alone after SNF stay but requires increased assistance now. ADLs Comments: uses bari [facility] with side platform to transfer WC<>BSC for toileting, bed baths with assist from spouse. pt able to feed self and perform oral care and shave.        OT Problem List: Impaired balance (sitting and/or standing);Pain;Obesity      OT  Treatment/Interventions: Self-care/ADL training;Balance training;Therapeutic exercise;Therapeutic activities;Patient/family education;DME and/or AE instruction    OT Goals(Current goals can be found in the care plan section) Acute Rehab OT Goals Patient Stated Goal: To get better and stronger; to lsoe weight OT Goal Formulation: With patient Time For Goal Achievement: 03/27/23 Potential to Achieve Goals: Good ADL Goals Pt Will Perform Lower Body Bathing: sitting/lateral leans;with adaptive equipment;with set-up Pt Will Perform Lower Body Dressing: sitting/lateral leans;with caregiver independent in assisting Pt/caregiver will Perform Home Exercise Program: Both right and left upper extremity;Independently;With theraband;With written HEP provided Additional ADL Goal #1: Pt will complete STS with Max A +1 using Stedy in preparation to work on OOB transfers Additional ADL Goal #2: Pt will complete log rolling in bed with min A + bed rails for pressure relief to prevent skin breakdown and assist with toileting setup  OT Frequency: Min 1X/week    Co-evaluation PT/OT/SLP Co-Evaluation/Treatment: Yes Reason for Co-Treatment: Complexity of the patient's impairments (multi-system involvement);For patient/therapist safety;To address functional/ADL transfers PT goals addressed during session: Mobility/safety with mobility;Balance;Proper use of DME OT goals addressed during session: ADL's and self-care;Proper use of Adaptive equipment and DME      AM-PAC OT "6 Clicks" Daily Activity     Outcome Measure Help from another person eating meals?: None Help from another person taking care of personal grooming?: A Little Help from another person toileting, which includes using toliet, bedpan, or urinal?: Total Help from another person bathing (including washing, rinsing, drying)?: A Lot Help from another person to put on and taking off regular upper body clothing?: A Little Help from another person to put  on and taking off regular lower body clothing?: Total 6 Click Score: 14   End of Session Equipment Utilized During Treatment: Other (comment) (Stedy and Maxi move) Nurse Communication: Need for lift equipment;Mobility status (max move)  Activity Tolerance: Patient tolerated treatment well Patient left: in chair;with call bell/phone within reach  OT Visit Diagnosis: Unsteadiness on feet (R26.81);Other abnormalities of gait and mobility (R26.89);Pain Pain - part of body:  (back)                Time: 6440-3474 OT Time Calculation (min): 43 min Charges:  OT General Charges $OT Visit: 1 Visit OT Evaluation $OT Eval Moderate Complexity: 1 Mod  03/13/2023  AB, OTR/L  Acute Rehabilitation Services  Office: [phone]   Tristan Schroeder 03/13/2023, 3:12 PM

## 2023-03-13 NOTE — Plan of Care (Signed)
  Problem: Education: Goal: Knowledge of disease or condition will improve Outcome: Progressing Goal: Knowledge of the prescribed therapeutic regimen will improve Outcome: Progressing Goal: Individualized Educational Video(s) Outcome: Progressing   Problem: Activity: Goal: Ability to tolerate increased activity will improve Outcome: Progressing Goal: Will verbalize the importance of balancing activity with adequate rest periods Outcome: Progressing   Problem: Respiratory: Goal: Ability to maintain a clear airway will improve Outcome: Progressing Goal: Levels of oxygenation will improve Outcome: Progressing Goal: Ability to maintain adequate ventilation will improve Outcome: Progressing   Problem: Respiratory: Goal: Ability to maintain a clear airway will improve Outcome: Progressing Goal: Levels of oxygenation will improve Outcome: Progressing Goal: Ability to maintain adequate ventilation will improve Outcome: Progressing   Problem: Education: Goal: Ability to describe self-care measures that Goldman prevent or decrease complications (Diabetes Survival Skills Education) will improve Outcome: Progressing Goal: Individualized Educational Video(s) Outcome: Progressing   Problem: Coping: Goal: Ability to adjust to condition or change in health will improve Outcome: Progressing   Problem: Fluid Volume: Goal: Ability to maintain a balanced intake and output will improve Outcome: Progressing   Problem: Health Behavior/Discharge Planning: Goal: Ability to identify and utilize available resources and services will improve Outcome: Progressing Goal: Ability to manage health-related needs will improve Outcome: Progressing   Problem: Metabolic: Goal: Ability to maintain appropriate glucose levels will improve Outcome: Progressing   Problem: Nutritional: Goal: Maintenance of adequate nutrition will improve Outcome: Progressing Goal: Progress toward achieving an optimal weight  will improve Outcome: Progressing   Problem: Skin Integrity: Goal: Risk for impaired skin integrity will decrease Outcome: Progressing   Problem: Tissue Perfusion: Goal: Adequacy of tissue perfusion will improve Outcome: Progressing   Problem: Education: Goal: Knowledge of General Education information will improve Description: Including pain rating scale, medication(s)/side effects and non-pharmacologic comfort measures Outcome: Progressing   Problem: Health Behavior/Discharge Planning: Goal: Ability to manage health-related needs will improve Outcome: Progressing   Problem: Clinical Measurements: Goal: Ability to maintain clinical measurements within normal limits will improve Outcome: Progressing Goal: Will remain free from infection Outcome: Progressing Goal: Diagnostic test results will improve Outcome: Progressing Goal: Respiratory complications will improve Outcome: Progressing Goal: Cardiovascular complication will be avoided Outcome: Progressing   Problem: Nutrition: Goal: Adequate nutrition will be maintained Outcome: Progressing   Problem: Elimination: Goal: Will not experience complications related to bowel motility Outcome: Progressing Goal: Will not experience complications related to urinary retention Outcome: Progressing   Problem: Pain Managment: Goal: General experience of comfort will improve Outcome: Progressing   Problem: Safety: Goal: Ability to remain free from injury will improve Outcome: Progressing   Problem: Skin Integrity: Goal: Risk for impaired skin integrity will decrease Outcome: Progressing

## 2023-03-14 LAB — GLUCOSE, CAPILLARY
Glucose-Capillary: 116 mg/dL — ABNORMAL HIGH (ref 70–99)
Glucose-Capillary: 111 mg/dL — ABNORMAL HIGH (ref 70–99)
Glucose-Capillary: 73 mg/dL (ref 70–99)
Glucose-Capillary: 121 mg/dL — ABNORMAL HIGH (ref 70–99)

## 2023-03-14 NOTE — Progress Notes (Signed)
Subjective: Patient reports back pain  Objective: Vital signs in last 24 hours: Temp:  [97.8 F (36.6 C)-98.6 F (37 C)] 98.1 F (36.7 C) (10/12 0809) Pulse Rate:  [78-95] 95 (10/12 0809) Resp:  [13-18] 14 (10/12 0314) BP: (123-148)/(72-85) 146/75 (10/12 0809) SpO2:  [90 %-94 %] 92 % (10/12 0809) FiO2 (%):  [21 %] 21 % (10/11 2132) Weight:  [156.7 kg] 156.7 kg (10/12 0320)  Intake/Output from previous day: 10/11 0701 - 10/12 0700 In: 400 [P.O.:400] Out: 3025 [Urine:3025] Intake/Output this shift: No intake/output data recorded.  NAD Obese MAEs, 3/5 strength in Les C/d  Lab Results: Recent Labs    03/11/23 1620 03/12/23 0620  WBC 12.6* 13.3*  HGB 12.3* 11.9*  HCT 37.1* 36.8*  PLT 290 289   BMET Recent Labs    03/11/23 1620 03/12/23 0620  NA 140  --   K 3.3*  --   CL 107  --   CO2 27  --   GLUCOSE 153*  --   BUN 40*  --   CREATININE 1.63* 1.35*  CALCIUM 9.1  --     Studies/Results: DG Lumbar Spine Complete  Result Date: 03/12/2023 CLINICAL DATA:  Elective surgery.  T10-T11 thoracic laminectomy. EXAM: LUMBAR SPINE - COMPLETE 4+ VIEW COMPARISON:  Thoracic MRI yesterday. FINDINGS: Five portable cross-table lateral views of the thoracolumbar spine obtained in the operating room. Film 1 demonstrates surgical instrument localizing posteriorly at the T11-T12 level. Film 2 demonstrates surgical instrument localizing at the upper T10 level. Film 3 demonstrates surgical instruments posteriorly at the T10 level. Film 4 demonstrates surgical instrument localizing posteriorly at the upper T10 level. Film 5 demonstrates surgical instrument localizing posteriorly at the T10-T11 level. IMPRESSION: Surgical instrument localizing posteriorly at the T10 and T11 levels. Electronically Signed   By: Narda Rutherford M.D.   On: 03/12/2023 17:25    Assessment/Plan: S/p thoracic discectomy  LOS: 2 days  - cont PT/OT - pain control   Bedelia Person 03/14/2023, 9:33  AM

## 2023-03-15 LAB — GLUCOSE, CAPILLARY
Glucose-Capillary: 142 mg/dL — ABNORMAL HIGH (ref 70–99)
Glucose-Capillary: 129 mg/dL — ABNORMAL HIGH (ref 70–99)
Glucose-Capillary: 67 mg/dL — ABNORMAL LOW (ref 70–99)
Glucose-Capillary: 69 mg/dL — ABNORMAL LOW (ref 70–99)
Glucose-Capillary: 81 mg/dL (ref 70–99)
Glucose-Capillary: 90 mg/dL (ref 70–99)
Glucose-Capillary: 88 mg/dL (ref 70–99)

## 2023-03-15 NOTE — Progress Notes (Signed)
    Providing Compassionate, Quality Care - Together   NEUROSURGERY PROGRESS NOTE     S: No issues overnight.    O: EXAM:  BP (!) 159/85 (BP Location: Right Arm)   Pulse 83   Temp 98.8 F (37.1 C) (Oral)   Resp 18   Ht 5\' 11"  (1.803 m)   Wt (!) 156.7 kg   SpO2 (!) 88%   BMI 48.18 kg/m     Awake, alert, oriented  Speech fluent, appropriate  MAES x4 SILTx4   ASSESSMENT:  57 y.o. s/p thoracic discectomy    PLAN: -Continue therapies as tolerated -Continue supportive care -Call w/ questions/concerns.   Patrici Ranks, [facility]

## 2023-03-16 LAB — GLUCOSE, CAPILLARY
Glucose-Capillary: 110 mg/dL — ABNORMAL HIGH (ref 70–99)
Glucose-Capillary: 114 mg/dL — ABNORMAL HIGH (ref 70–99)
Glucose-Capillary: 79 mg/dL (ref 70–99)
Glucose-Capillary: 100 mg/dL — ABNORMAL HIGH (ref 70–99)

## 2023-03-16 MED ORDER — INSULIN ASPART 100 UNIT/ML IJ SOLN
0.0000 [IU] | Freq: Three times a day (TID) | INTRAMUSCULAR | Status: DC
  Administered 2023-03-19 – 2023-03-20 (×3): 1 [IU] via SUBCUTANEOUS
  Administered 2023-03-22: 2 [IU] via SUBCUTANEOUS
  Administered 2023-03-23 (×2): 1 [IU] via SUBCUTANEOUS

## 2023-03-16 MED ORDER — INSULIN ASPART 100 UNIT/ML IJ SOLN: 0.0000 [IU] | Freq: Every day | INTRAMUSCULAR | Status: DC

## 2023-03-16 NOTE — Inpatient Diabetes Management (Signed)
Inpatient Diabetes Program Recommendations  AACE/ADA: New Consensus Statement on Inpatient Glycemic Control (2015)  Target Ranges:  Prepandial:   less than 140 mg/dL      Peak postprandial:   less than 180 mg/dL (1-2 hours)      Critically ill patients:  140 - 180 mg/dL   Lab Results  Component Value Date   GLUCAP 110 (H) 03/16/2023   HGBA1C 6.0 (H) 03/12/2023    Latest Reference Range & Units 03/15/23 07:50 03/15/23 11:55 03/15/23 15:56 03/15/23 16:18 03/15/23 16:44 03/15/23 19:11 03/15/23 21:07 03/16/23 08:12  Glucose-Capillary 70 - 99 mg/dL 086 (H) 578 (H) 67 (L) 69 (L) 81 90 88 110 (H)  (H): Data is abnormally high (L): Data is abnormally low  Diabetes history: DM2 Outpatient Diabetes medications: Glucotrol 10 mg bid, Metformin 500 mg bid, Mounjaro 10 mg weekly Current orders for Inpatient glycemic control: Glucotrol 10 mg daily, Metformin 1 gm am, Novolog 0-20 units qid  Inpatient Diabetes Program Recommendations:   Patient had hypoglycemia post Novolog correction. Please consider: -Decrease Novolog correction to 0-9 units tid, 0-5 units hs  Thank you, Darel Hong E. Gildo Crisco, RN, MSN, CDE  Diabetes Coordinator Inpatient Glycemic Control Team Team Pager [phone] (8am-5pm) 03/16/2023 11:50 AM

## 2023-03-16 NOTE — Plan of Care (Signed)
Problem: Education: Goal: Knowledge of disease or condition will improve Outcome: Progressing Goal: Knowledge of the prescribed therapeutic regimen will improve Outcome: Progressing Goal: Individualized Educational Video(s) Outcome: Progressing

## 2023-03-16 NOTE — Progress Notes (Signed)
Inpatient Rehab Admissions Coordinator:   Per therapy recommendations, patient was screened for CIR candidacy by Megan Salon, MS, CCC-SLP. At this time, Pt. is not yet at a level to tolerate the intensity of CIR; however,   Pt. Pelfrey have potential to progress to becoming a potential CIR candidate, so CIR admissions team will follow and monitor for progress and participation with therapies and place consult order if Pt. appears to be an appropriate candidate. Please contact me with any questions.   Megan Salon, MS, CCC-SLP Rehab Admissions Coordinator  [phone] (celll) [phone] (office)

## 2023-03-16 NOTE — Progress Notes (Signed)
Occupational Therapy Treatment Patient Details Name: Raymond [name] MRN: [id number] DOB: [birth year] Today's Date: 03/16/2023   History of present illness Patient is a 57 y.o. 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 He is now s/p T10 laminectomy and Discectomy. PMH of anxiety, COPD, DM, Edema, Gout, HTN, HLD, L medial menisectomy, ORIF L radius and ulna.   OT comments  Patient with complaints of nausea but willing to work with OT/PT. Patient in good spirits, joking often during visit. Patient instructed on log rolling and back precautions for rolling in bed for lift pad placement and for sidelying to sitting on EOB . Patient able to demonstrate fair sitting balance on EOB and perform grooming tasks. Transfer performed from EOB to chair with maximove and patient positioned for comfort. Patient will benefit from intensive inpatient follow up therapy, >3 hours/day to increase independence and safety with functional transfers and self care.       If plan is discharge home, recommend the following:  Two people to help with walking and/or transfers;A lot of help with bathing/dressing/bathroom;Assistance with Ecologist;Hospital bed    Recommendations for Other Services Rehab consult    Precautions / Restrictions Precautions Precautions: Fall Restrictions Weight Bearing Restrictions: No       Mobility Bed Mobility Overal bed mobility: Needs Assistance Bed Mobility: Rolling, Sidelying to Sit Rolling: Mod assist, +2 for safety/equipment, Used rails Sidelying to sit: Mod assist, +2 for safety/equipment       General bed mobility comments: reviewed back precautions before bed mobility, rolling to place lift pad. assistance with LEs and trunk to get to EOB and maintain back precautions    Transfers Overall transfer level: Needs  assistance Equipment used: Ambulation equipment used Transfers: Bed to chair/wheelchair/BSC             General transfer comment: transfer from EOB to recliner with maximove Transfer via Lift Equipment: Maximove   Balance Overall balance assessment: Needs assistance Sitting-balance support: Feet supported, Bilateral upper extremity supported Sitting balance-Leahy Scale: Fair Sitting balance - Comments: able to perform grooming tasks seated on EOB       Standing balance comment: not attempted                           ADL either performed or assessed with clinical judgement   ADL Overall ADL's : Needs assistance/impaired     Grooming: Wash/dry hands;Wash/dry face;Oral care;Brushing hair;Set up;Sitting Grooming Details (indicate cue type and reason): on EOB                                    Extremity/Trunk Assessment              Vision       Perception     Praxis      Cognition Arousal: Alert Behavior During Therapy: WFL for tasks assessed/performed Overall Cognitive Status: Within Functional Limits for tasks assessed                                 General Comments: patient with complaints of nausea, reviewed back precautions        Exercises      Shoulder Instructions  General Comments      Pertinent Vitals/ Pain       Pain Assessment Pain Assessment: Faces Faces Pain Scale: Hurts even more Pain Location: back Pain Descriptors / Indicators: Aching, Discomfort Pain Intervention(s): Limited activity within patient's tolerance, Monitored during session, RN gave pain meds during session, Repositioned  Home Living                                          Prior Functioning/Environment              Frequency  Min 1X/week        Progress Toward Goals  OT Goals(current goals can now be found in the care plan section)  Progress towards OT goals: Progressing toward  goals  Acute Rehab OT Goals Patient Stated Goal: get better OT Goal Formulation: With patient Time For Goal Achievement: 03/27/23 Potential to Achieve Goals: Good ADL Goals Pt Will Perform Lower Body Bathing: sitting/lateral leans;with adaptive equipment;with set-up Pt Will Perform Lower Body Dressing: sitting/lateral leans;with caregiver independent in assisting Pt/caregiver will Perform Home Exercise Program: Both right and left upper extremity;Independently;With theraband;With written HEP provided Additional ADL Goal #1: Pt will complete STS with Max A +1 using Stedy in preparation to work on OOB transfers Additional ADL Goal #2: Pt will complete log rolling in bed with min A + bed rails for pressure relief to prevent skin breakdown and assist with toileting setup  Plan      Co-evaluation    PT/OT/SLP Co-Evaluation/Treatment: Yes Reason for Co-Treatment: Complexity of the patient's impairments (multi-system involvement);For patient/therapist safety;To address functional/ADL transfers   OT goals addressed during session: ADL's and self-care;Proper use of Adaptive equipment and DME      AM-PAC OT "6 Clicks" Daily Activity     Outcome Measure   Help from another person eating meals?: None Help from another person taking care of personal grooming?: A Little Help from another person toileting, which includes using toliet, bedpan, or urinal?: Total Help from another person bathing (including washing, rinsing, drying)?: A Lot Help from another person to put on and taking off regular upper body clothing?: A Little Help from another person to put on and taking off regular lower body clothing?: Total 6 Click Score: 14    End of Session Equipment Utilized During Treatment: Other (comment) (maximove)  OT Visit Diagnosis: Unsteadiness on feet (R26.81);Other abnormalities of gait and mobility (R26.89);Pain Pain - part of body:  (back)   Activity Tolerance Patient tolerated treatment  well   Patient Left in chair;with call bell/phone within reach;with chair alarm set   Nurse Communication Need for lift equipment;Mobility status        Time: 6433-2951 OT Time Calculation (min): 34 min  Charges: OT General Charges $OT Visit: 1 Visit OT Treatments $Self Care/Home Management : 8-22 mins  Alfonse Flavors, OTA Acute Rehabilitation Services  Office [phone]   Dewain Penning 03/16/2023, 1:27 PM

## 2023-03-16 NOTE — Progress Notes (Signed)
Patient ID: Raymond [name], male   DOB: 1966-04-13, 57 y.o.   MRN: [id number] BP (!) 141/76 (BP Location: Right Arm)   Pulse 74   Temp 98.7 F (37.1 C) (Oral)   Resp 18   Ht 5\' 11"  (1.803 m)   Wt (!) 147.2 kg   SpO2 96%   BMI 45.26 kg/m  Alert and oriented x 4 Not walking as of now Wound is clean Continue with therapy Weakness bilateral lower extremities

## 2023-03-16 NOTE — Progress Notes (Signed)
Physical Therapy Treatment Patient Details Name: Raymond [name] MRN: [id number] DOB: November 04, 1965 Today's Date: 03/16/2023   History of Present Illness Patient is a 57 y.o. 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 He is now s/p T10 laminectomy and Discectomy. PMH of anxiety, COPD, DM, Edema, Gout, HTN, HLD, L medial menisectomy, ORIF L radius and ulna.    PT Comments  Patient resting in bed at start of session and agreeable to participate in therapy. Pt highly motivated to improve and attempting to initiate all mobility, cues required for reminder to maintain "BLT" for spine with bed mobility. Maximove lift positioned under pt in supine prior to roll and sit to EOB. Once EOB pt completed lateral forearm prop bil for sling adjustment and performed bil LE exercises. Pt also participated in oral hygiene with assist from OT. Maximove lift transfer utilized for bed>chair and pt repositioned in reclined position in chair. Will continue to progress pt as able during stay and recommend intense rehab follow up > 3 hours/day.   If plan is discharge home, recommend the following: Two people to help with walking and/or transfers;Two people to help with bathing/dressing/bathroom;Assistance with cooking/housework;Direct supervision/assist for medications management;Assist for transportation;Help with stairs or ramp for entrance   Can travel by private vehicle        Equipment Recommendations  Hospital bed;Wheelchair cushion (measurements PT);Wheelchair (measurements PT);Hoyer lift    Recommendations for [employer] [facility] consult     Precautions / Restrictions Precautions Precautions: Fall Restrictions Weight Bearing Restrictions: No     Mobility  Bed Mobility Overal bed mobility: Needs Assistance Bed Mobility: Rolling, Sidelying to Sit Rolling: Mod assist, +2 for safety/equipment, Used  rails Sidelying to sit: +2 for safety/equipment, Mod assist, Max assist       General bed mobility comments: reviewed back precautions before bed mobility, rolling to place lift pad. assistance with LEs and trunk to get to EOB and maintain back precautions    Transfers Overall transfer level: Needs assistance Equipment used: Ambulation equipment used Transfers: Bed to chair/wheelchair/BSC             General transfer comment: transfer from EOB to recliner with maximove Transfer via Lift Equipment: Maximove  Ambulation/Gait                   Stairs             Wheelchair Mobility     Tilt Bed    Modified Rankin (Stroke Patients Only)       Balance Overall balance assessment: Needs assistance Sitting-balance support: Feet supported, Bilateral upper extremity supported Sitting balance-Leahy Scale: Fair Sitting balance - Comments: able to perform grooming tasks seated on EOB       Standing balance comment: not attempted                            Cognition Arousal: Alert Behavior During Therapy: WFL for tasks assessed/performed Overall Cognitive Status: Within Functional Limits for tasks assessed                                 General Comments: patient with complaints of nausea, reviewed back precautions        Exercises General Exercises - Lower Extremity Ankle Circles/Pumps: AROM, Both, 10 reps, Seated Long  Arc Quad: AROM, Both, 10 reps, Seated    General Comments        Pertinent Vitals/Pain Pain Assessment Pain Assessment: Faces Faces Pain Scale: Hurts even more Pain Location: back Pain Descriptors / Indicators: Aching, Discomfort Pain Intervention(s): Monitored during session, Limited activity within patient's tolerance, Repositioned    Home Living                          Prior Function            PT Goals (current goals can now be found in the care plan section) Acute Rehab PT  Goals Patient Stated Goal: rehab here PT Goal Formulation: With patient Time For Goal Achievement: 03/27/23 Potential to Achieve Goals: Good Progress towards PT goals: Progressing toward goals    Frequency    Min 1X/week      PT Plan      Co-evaluation PT/OT/SLP Co-Evaluation/Treatment: Yes Reason for Co-Treatment: Complexity of the patient's impairments (multi-system involvement);For patient/therapist safety;To address functional/ADL transfers PT goals addressed during session: Mobility/safety with mobility;Balance;Strengthening/ROM;Proper use of DME OT goals addressed during session: ADL's and self-care;Proper use of Adaptive equipment and DME      AM-PAC PT "6 Clicks" Mobility   Outcome Measure  Help needed turning from your back to your side while in a flat bed without using bedrails?: Total Help needed moving from lying on your back to sitting on the side of a flat bed without using bedrails?: Total Help needed moving to and from a bed to a chair (including a wheelchair)?: Total Help needed standing up from a chair using your arms (e.g., wheelchair or bedside chair)?: Total Help needed to walk in hospital room?: Total Help needed climbing 3-5 steps with a railing? : Total 6 Click Score: 6    End of Session   Activity Tolerance: Patient tolerated treatment well Patient left: in chair;with call bell/phone within reach;with chair alarm set (Striker/Sizewize bari chair) Nurse Communication: Mobility status;Need for lift equipment PT Visit Diagnosis: Other abnormalities of gait and mobility (R26.89);Muscle weakness (generalized) (M62.81);Difficulty in walking, not elsewhere classified (R26.2);Other symptoms and signs involving the nervous system (R29.898);Pain Pain - part of body:  (back)     Time: 1610-9604 PT Time Calculation (min) (ACUTE ONLY): 35 min  Charges:    $Therapeutic Activity: 8-22 mins PT General Charges $$ ACUTE PT VISIT: 1 Visit                      Wynn Maudlin, DPT Acute Rehabilitation Services Office [phone]  03/16/23 1:50 PM

## 2023-03-17 LAB — GLUCOSE, CAPILLARY
Glucose-Capillary: 106 mg/dL — ABNORMAL HIGH (ref 70–99)
Glucose-Capillary: 98 mg/dL (ref 70–99)
Glucose-Capillary: 85 mg/dL (ref 70–99)
Glucose-Capillary: 90 mg/dL (ref 70–99)
Glucose-Capillary: 94 mg/dL (ref 70–99)

## 2023-03-17 NOTE — Progress Notes (Signed)
Patient ID: Raymond [name], male   DOB: May 15, 1966, 57 y.o.   MRN: [id number] BP (!) 167/76 (BP Location: Left Arm)   Pulse 91   Temp 99.1 F (37.3 C) (Oral)   Resp 20   Ht 5\' 11"  (1.803 m)   Wt (!) 149.3 kg   SpO2 92%   BMI 45.91 kg/m  Alert and oriented x 4 Increasing strength in left lower extremity Continue with PT Will transfer to floor

## 2023-03-17 NOTE — Plan of Care (Signed)
Patient and alert and oriented. OOB to chair using maximove lift. Educated patient and wife on plan of care. Bowel movement noted today. Medicated for pain as requested, see MAR for details.   Problem: Education: Goal: Knowledge of disease or condition will improve Outcome: Progressing Goal: Knowledge of the prescribed therapeutic regimen will improve Outcome: Progressing Goal: Individualized Educational Video(s) Outcome: Progressing   Problem: Activity: Goal: Ability to tolerate increased activity will improve Outcome: Progressing Goal: Will verbalize the importance of balancing activity with adequate rest periods Outcome: Progressing   Problem: Respiratory: Goal: Ability to maintain a clear airway will improve Outcome: Progressing Goal: Levels of oxygenation will improve Outcome: Progressing Goal: Ability to maintain adequate ventilation will improve Outcome: Progressing   Problem: Education: Goal: Ability to describe self-care measures that [place] prevent or decrease complications (Diabetes Survival Skills Education) will improve Outcome: Progressing Goal: Individualized Educational Video(s) Outcome: Progressing   Problem: Coping: Goal: Ability to adjust to condition or change in health will improve Outcome: Progressing   Problem: Fluid Volume: Goal: Ability to maintain a balanced intake and output will improve Outcome: Progressing   Problem: Health Behavior/Discharge Planning: Goal: Ability to identify and utilize available resources and services will improve Outcome: Progressing Goal: Ability to manage health-related needs will improve Outcome: Progressing   Problem: Metabolic: Goal: Ability to maintain appropriate glucose levels will improve Outcome: Progressing   Problem: Nutritional: Goal: Maintenance of adequate nutrition will improve Outcome: Progressing Goal: Progress toward achieving an optimal weight will improve Outcome: Progressing   Problem: Skin  Integrity: Goal: Risk for impaired skin integrity will decrease Outcome: Progressing   Problem: Tissue Perfusion: Goal: Adequacy of tissue perfusion will improve Outcome: Progressing   Problem: Education: Goal: Knowledge of General Education information will improve Description: Including pain rating scale, medication(s)/side effects and non-pharmacologic comfort measures Outcome: Progressing   Problem: Health Behavior/Discharge Planning: Goal: Ability to manage health-related needs will improve Outcome: Progressing   Problem: Clinical Measurements: Goal: Ability to maintain clinical measurements within normal limits will improve Outcome: Progressing Goal: Will remain free from infection Outcome: Progressing Goal: Diagnostic test results will improve Outcome: Progressing Goal: Respiratory complications will improve Outcome: Progressing Goal: Cardiovascular complication will be avoided Outcome: Progressing   Problem: Activity: Goal: Risk for activity intolerance will decrease Outcome: Progressing   Problem: Nutrition: Goal: Adequate nutrition will be maintained Outcome: Progressing   Problem: Coping: Goal: Level of anxiety will decrease Outcome: Progressing   Problem: Elimination: Goal: Will not experience complications related to bowel motility Outcome: Progressing Goal: Will not experience complications related to urinary retention Outcome: Progressing   Problem: Pain Managment: Goal: General experience of comfort will improve Outcome: Progressing   Problem: Safety: Goal: Ability to remain free from injury will improve Outcome: Progressing   Problem: Skin Integrity: Goal: Risk for impaired skin integrity will decrease Outcome: Progressing

## 2023-03-17 NOTE — Plan of Care (Signed)
  Problem: Metabolic: Goal: Ability to maintain appropriate glucose levels will improve Outcome: Progressing   Problem: Skin Integrity: Goal: Risk for impaired skin integrity will decrease Outcome: Progressing   Problem: Clinical Measurements: Goal: Will remain free from infection Outcome: Progressing   Problem: Activity: Goal: Risk for activity intolerance will decrease Outcome: Progressing   Problem: Coping: Goal: Level of anxiety will decrease Outcome: Progressing   Problem: Pain Managment: Goal: General experience of comfort will improve Outcome: Progressing   Problem: Safety: Goal: Ability to remain free from injury will improve Outcome: Progressing   Problem: Skin Integrity: Goal: Risk for impaired skin integrity will decrease Outcome: Progressing

## 2023-03-17 NOTE — Progress Notes (Signed)
S/p Thoracic Laminectomy and discectomy- from home- per PT/OT evals recs for CIR- screening completed by CIR liaison- and note that pt not yet at level to participate at the level of intensity needed for CIR- they will continue to follow pt progression- however pt Magda need SNF level vs CIR pending progress.  TOC  will continue to monitor patient advancement through interdisciplinary progression rounds. If new patient transition needs arise, please place a TOC consult.      03/17/23 1100  TOC Brief Assessment  Insurance and Status Reviewed  Patient has primary care physician Yes  Home environment has been reviewed home  Prior level of function: hx of falls  Prior/Current Home Services Current home services (Active w/ [facility]- per [facility])  Social Determinants of Health Reivew SDOH reviewed no interventions necessary  Readmission risk has been reviewed Yes  Transition of care needs transition of care needs identified, TOC will continue to follow

## 2023-03-18 LAB — GLUCOSE, CAPILLARY
Glucose-Capillary: 98 mg/dL (ref 70–99)
Glucose-Capillary: 98 mg/dL (ref 70–99)
Glucose-Capillary: 72 mg/dL (ref 70–99)
Glucose-Capillary: 92 mg/dL (ref 70–99)

## 2023-03-18 NOTE — Progress Notes (Signed)
Physical Therapy Treatment Patient Details Name: Cinsere Damboise Burgett MRN: [id number] DOB: [birth year] Today's Date: 03/18/2023   History of Present Illness Patient is a 57 y.o. 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 He is now s/p T10 laminectomy and Discectomy. PMH of anxiety, COPD, DM, Edema, Gout, HTN, HLD, L medial menisectomy, ORIF L radius and ulna.    PT Comments  Patient demonstrated good initiation to reach bil for roll Rt/Lt for sling placement and Mod+2 required to bring LE's off EOB and raise trunk. EOB elevated and pt able to complete sit<>stand 2x from EOB on Stedy. Pt able to extend hips enough for paddle placement for stedy transfers bed>chair. Pt's Lt LE noted to flex lifting foot from foot plate due to hypertonicity and manual placement/blocking required to maintain safe foot position during transfer. Stand completed from paddles and 2 additional partial (50%) stands from recliner to reposition chair pad for comfort. EOS pt reported comfortable with pillows for support and Alarm on and call bell within reach. Will continue to progress pt as able during stay.    If plan is discharge home, recommend the following: Two people to help with walking and/or transfers;Two people to help with bathing/dressing/bathroom;Assistance with cooking/housework;Direct supervision/assist for medications management;Assist for transportation;Help with stairs or ramp for entrance   Can travel by private vehicle        Equipment Recommendations  Hospital bed;Wheelchair cushion (measurements PT);Wheelchair (measurements PT);Hoyer lift    Recommendations for [employer] [facility] consult     Precautions / Restrictions Precautions Precautions: Fall Restrictions Weight Bearing Restrictions: No     Mobility  Bed Mobility Overal bed mobility: Needs Assistance Bed Mobility: Rolling, Sidelying to  Sit Rolling: Mod assist, +2 for safety/equipment, Used rails Sidelying to sit: +2 for safety/equipment, Mod assist, Max assist       General bed mobility comments: instructions on log rolling and and assistance with BLEs and trunk    Transfers Overall transfer level: Needs assistance Equipment used: Ambulation equipment used Transfers: Sit to/from Stand, Bed to chair/wheelchair/BSC Sit to Stand: Max assist, +2 physical assistance, +2 safety/equipment, From elevated surface, Via lift equipment           General transfer comment: sit to stands performed from EOB into stedy x2 and transferrred to recliner. 2 more sit to stands performed to straighten pad with limited standing tolerance and increased assistance Transfer via Lift Equipment: Stedy  Ambulation/Gait                   Stairs             Wheelchair Mobility     Tilt Bed    Modified Rankin (Stroke Patients Only)       Balance Overall balance assessment: Needs assistance Sitting-balance support: Feet supported, Bilateral upper extremity supported Sitting balance-Leahy Scale: Fair Sitting balance - Comments: able to perform self care tasks seated on EOB   Standing balance support: Bilateral upper extremity supported, During functional activity, Reliant on assistive device for balance Standing balance-Leahy Scale: Poor Standing balance comment: reliant on stedy for support. performed 4 total stands into stedy with deminishing standing tolerance                            Cognition Arousal: Alert Behavior During Therapy: WFL for tasks assessed/performed Overall Cognitive Status: Within Functional Limits  for tasks assessed                                 General Comments: jovial, motivated towards recovery        Exercises      General Comments        Pertinent Vitals/Pain Pain Assessment Pain Assessment: Faces Faces Pain Scale: Hurts even more Pain Location:  back Pain Descriptors / Indicators: Aching, Discomfort Pain Intervention(s): Limited activity within patient's tolerance, Monitored during session, Repositioned    Home Living   Living Arrangements: Spouse/significant other                      Prior Function            PT Goals (current goals can now be found in the care plan section) Acute Rehab PT Goals PT Goal Formulation: With patient Time For Goal Achievement: 03/27/23 Potential to Achieve Goals: Good Progress towards PT goals: Progressing toward goals    Frequency    Min 1X/week      PT Plan      Co-evaluation PT/OT/SLP Co-Evaluation/Treatment: Yes Reason for Co-Treatment: Complexity of the patient's impairments (multi-system involvement);For patient/therapist safety;To address functional/ADL transfers   OT goals addressed during session: ADL's and self-care;Proper use of Adaptive equipment and DME      AM-PAC PT "6 Clicks" Mobility   Outcome Measure  Help needed turning from your back to your side while in a flat bed without using bedrails?: A Lot Help needed moving from lying on your back to sitting on the side of a flat bed without using bedrails?: Total Help needed moving to and from a bed to a chair (including a wheelchair)?: Total Help needed standing up from a chair using your arms (e.g., wheelchair or bedside chair)?: Total Help needed to walk in hospital room?: Total Help needed climbing 3-5 steps with a railing? : Total 6 Click Score: 7    End of Session Equipment Utilized During Treatment: Gait belt Activity Tolerance: Patient tolerated treatment well Patient left: in chair;with call bell/phone within reach;with chair alarm set Nurse Communication: Mobility status;Need for lift equipment PT Visit Diagnosis: Other abnormalities of gait and mobility (R26.89);Muscle weakness (generalized) (M62.81);Difficulty in walking, not elsewhere classified (R26.2);Other symptoms and signs involving the  nervous system (R29.898);Pain Pain - part of body:  (back)     Time: 6213-0865 PT Time Calculation (min) (ACUTE ONLY): 28 min  Charges:    $Therapeutic Activity: 8-22 mins PT General Charges $$ ACUTE PT VISIT: 1 Visit                     Wynn Maudlin, DPT Acute Rehabilitation Services Office [phone]  03/18/23 2:09 PM

## 2023-03-18 NOTE — Progress Notes (Signed)
Occupational Therapy Treatment Patient Details Name: Raymond [name] MRN: [id number] DOB: May 30, 1966 Today's Date: 03/18/2023   History of present illness Patient is a 57 y.o. 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 He is now s/p T10 laminectomy and Discectomy. PMH of anxiety, COPD, DM, Edema, Gout, HTN, HLD, L medial menisectomy, ORIF L radius and ulna.   OT comments  Patient demontrating good progress with OT/PT treatment. Patient continues to require mod assist 2 for sidelying to sitting on EOB. Patient able to participate in UB bathing and dressing seated on EOB and progressed to standing in stedy for peri area bottom cleaning. Patient able to transfer to recliner with [facility] on this date. Patient able to stand from recliner into stedy with increased assistance due to lower surface. Patient will benefit from intensive inpatient follow up therapy, >3 hours/day to continue to address bed mobility, transfers, and self care.       If plan is discharge home, recommend the following:  Two people to help with walking and/or transfers;A lot of help with bathing/dressing/bathroom;Assistance with Ecologist;Hospital bed    Recommendations for Other Services Rehab consult    Precautions / Restrictions Precautions Precautions: Fall Restrictions Weight Bearing Restrictions: No       Mobility Bed Mobility Overal bed mobility: Needs Assistance Bed Mobility: Rolling, Sidelying to Sit Rolling: Mod assist, +2 for safety/equipment, Used rails Sidelying to sit: +2 for safety/equipment, Mod assist, Max assist       General bed mobility comments: instructions on log rolling and and assistance with BLEs and trunk    Transfers Overall transfer level: Needs assistance Equipment used: Ambulation equipment used Transfers: Sit to/from Stand, Bed to  chair/wheelchair/BSC Sit to Stand: Max assist, +2 physical assistance, +2 safety/equipment, From elevated surface, Via lift equipment           General transfer comment: sit to stands performed from EOB into stedy x2 and transferrred to recliner. 2 more sit to stands performed to straighten pad with limited standing tolerance and increased assistance Transfer via Lift Equipment: Stedy   Balance Overall balance assessment: Needs assistance Sitting-balance support: Feet supported, Bilateral upper extremity supported Sitting balance-Leahy Scale: Fair Sitting balance - Comments: able to perform self care tasks seated on EOB   Standing balance support: Bilateral upper extremity supported, During functional activity, Reliant on assistive device for balance Standing balance-Leahy Scale: Poor Standing balance comment: reliant on stedy for support. performed 4 total stands into stedy with deminishing standing tolerance                           ADL either performed or assessed with clinical judgement   ADL Overall ADL's : Needs assistance/impaired         Upper Body Bathing: Minimal assistance;Sitting Upper Body Bathing Details (indicate cue type and reason): on EOB Lower Body Bathing: Moderate assistance;Sitting/lateral leans   Upper Body Dressing : Sitting;Minimal assistance Upper Body Dressing Details (indicate cue type and reason): gown change Lower Body Dressing: Moderate assistance;Sitting/lateral leans Lower Body Dressing Details (indicate cue type and reason): to donn shoes                    Extremity/Trunk Assessment              Vision  Perception     Praxis      Cognition Arousal: Alert Behavior During Therapy: WFL for tasks assessed/performed Overall Cognitive Status: Within Functional Limits for tasks assessed                                 General Comments: jovial, motivated towards recovery        Exercises       Shoulder Instructions       General Comments      Pertinent Vitals/ Pain       Pain Assessment Pain Assessment: Faces Faces Pain Scale: Hurts even more Pain Location: back Pain Descriptors / Indicators: Aching, Discomfort Pain Intervention(s): Monitored during session, Repositioned  Home Living                                          Prior Functioning/Environment              Frequency  Min 1X/week        Progress Toward Goals  OT Goals(current goals can now be found in the care plan section)  Progress towards OT goals: Progressing toward goals  Acute Rehab OT Goals Patient Stated Goal: get stronger OT Goal Formulation: With patient Time For Goal Achievement: 03/27/23 Potential to Achieve Goals: Good ADL Goals Pt Will Perform Lower Body Bathing: sitting/lateral leans;with adaptive equipment;with set-up Pt Will Perform Lower Body Dressing: sitting/lateral leans;with caregiver independent in assisting Pt/caregiver will Perform Home Exercise Program: Both right and left upper extremity;Independently;With theraband;With written HEP provided Additional ADL Goal #1: Pt will complete STS with Max A +1 using Stedy in preparation to work on OOB transfers Additional ADL Goal #2: Pt will complete log rolling in bed with min A + bed rails for pressure relief to prevent skin breakdown and assist with toileting setup  Plan      Co-evaluation    PT/OT/SLP Co-Evaluation/Treatment: Yes Reason for Co-Treatment: Complexity of the patient's impairments (multi-system involvement);For patient/therapist safety;To address functional/ADL transfers   OT goals addressed during session: ADL's and self-care;Proper use of Adaptive equipment and DME      AM-PAC OT "6 Clicks" Daily Activity     Outcome Measure   Help from another person eating meals?: None Help from another person taking care of personal grooming?: A Little Help from another person toileting,  which includes using toliet, bedpan, or urinal?: Total Help from another person bathing (including washing, rinsing, drying)?: A Lot Help from another person to put on and taking off regular upper body clothing?: A Little Help from another person to put on and taking off regular lower body clothing?: Total 6 Click Score: 14    End of Session Equipment Utilized During Treatment: Other (comment) Antony Salmon)  OT Visit Diagnosis: Unsteadiness on feet (R26.81);Other abnormalities of gait and mobility (R26.89);Pain Pain - part of body:  (back)   Activity Tolerance Patient tolerated treatment well   Patient Left in chair;with call bell/phone within reach;with chair alarm set   Nurse Communication Need for lift equipment;Mobility status        Time: 4098-1191 OT Time Calculation (min): 28 min  Charges: OT General Charges $OT Visit: 1 Visit OT Treatments $Self Care/Home Management : 8-22 mins  Alfonse Flavors, OTA Acute Rehabilitation Services  Office [phone]   Dewain Penning 03/18/2023, 1:47 PM

## 2023-03-18 NOTE — Progress Notes (Signed)
Inpatient Rehab Admissions Coordinator:  ? ?Per therapy recommendations,  patient was screened for CIR candidacy by Devaney Segers, MS, CCC-SLP. At this time, Pt. Appears to be a a potential candidate for CIR. I will place   order for rehab consult per protocol for full assessment. Please contact me any with questions. ? ?Trine Fread, MS, CCC-SLP ?Rehab Admissions Coordinator  ?[phone] (celll) ?[phone] (office) ? ?

## 2023-03-18 NOTE — Plan of Care (Signed)
  Problem: Activity: Goal: Ability to tolerate increased activity will improve Outcome: Progressing   Problem: Activity: Goal: Will verbalize the importance of balancing activity with adequate rest periods Outcome: Progressing   Problem: Health Behavior/Discharge Planning: Goal: Ability to manage health-related needs will improve Outcome: Progressing   Problem: Activity: Goal: Risk for activity intolerance will decrease Outcome: Progressing   Problem: Pain Managment: Goal: General experience of comfort will improve Outcome: Progressing

## 2023-03-19 DIAGNOSIS — R252 Cramp and spasm: Secondary | ICD-10-CM

## 2023-03-19 DIAGNOSIS — M5104 Intervertebral disc disorders with myelopathy, thoracic region: Secondary | ICD-10-CM

## 2023-03-19 DIAGNOSIS — N319 Neuromuscular dysfunction of bladder, unspecified: Secondary | ICD-10-CM | POA: Diagnosis not present

## 2023-03-19 LAB — GLUCOSE, CAPILLARY
Glucose-Capillary: 115 mg/dL — ABNORMAL HIGH (ref 70–99)
Glucose-Capillary: 142 mg/dL — ABNORMAL HIGH (ref 70–99)
Glucose-Capillary: 66 mg/dL — ABNORMAL LOW (ref 70–99)
Glucose-Capillary: 82 mg/dL (ref 70–99)
Glucose-Capillary: 91 mg/dL (ref 70–99)

## 2023-03-19 MED ORDER — BACLOFEN 10 MG PO TABS
5.0000 mg | ORAL_TABLET | Freq: Two times a day (BID) | ORAL | Status: DC
  Administered 2023-03-19 – 2023-03-23 (×9): 5 mg via ORAL
  Filled 2023-03-19 (×9): qty 1

## 2023-03-19 NOTE — Plan of Care (Signed)
  Problem: Education: Goal: Knowledge of disease or condition will improve Outcome: Progressing Goal: Knowledge of the prescribed therapeutic regimen will improve Outcome: Progressing Goal: Individualized Educational Video(s) Outcome: Progressing   Problem: Activity: Goal: Ability to tolerate increased activity will improve Outcome: Progressing Goal: Will verbalize the importance of balancing activity with adequate rest periods Outcome: Progressing   Problem: Respiratory: Goal: Ability to maintain a clear airway will improve Outcome: Progressing Goal: Levels of oxygenation will improve Outcome: Progressing Goal: Ability to maintain adequate ventilation will improve Outcome: Progressing   Problem: Education: Goal: Ability to describe self-care measures that [place] prevent or decrease complications (Diabetes Survival Skills Education) will improve Outcome: Progressing Goal: Individualized Educational Video(s) Outcome: Progressing   Problem: Coping: Goal: Ability to adjust to condition or change in health will improve Outcome: Progressing   Problem: Fluid Volume: Goal: Ability to maintain a balanced intake and output will improve Outcome: Progressing   Problem: Health Behavior/Discharge Planning: Goal: Ability to identify and utilize available resources and services will improve Outcome: Progressing Goal: Ability to manage health-related needs will improve Outcome: Progressing   Problem: Metabolic: Goal: Ability to maintain appropriate glucose levels will improve Outcome: Progressing   Problem: Nutritional: Goal: Maintenance of adequate nutrition will improve Outcome: Progressing Goal: Progress toward achieving an optimal weight will improve Outcome: Progressing   Problem: Skin Integrity: Goal: Risk for impaired skin integrity will decrease Outcome: Progressing   Problem: Tissue Perfusion: Goal: Adequacy of tissue perfusion will improve Outcome: Progressing    Problem: Education: Goal: Knowledge of General Education information will improve Description: Including pain rating scale, medication(s)/side effects and non-pharmacologic comfort measures Outcome: Progressing   Problem: Health Behavior/Discharge Planning: Goal: Ability to manage health-related needs will improve Outcome: Progressing   Problem: Clinical Measurements: Goal: Ability to maintain clinical measurements within normal limits will improve Outcome: Progressing Goal: Will remain free from infection Outcome: Progressing Goal: Diagnostic test results will improve Outcome: Progressing Goal: Respiratory complications will improve Outcome: Progressing Goal: Cardiovascular complication will be avoided Outcome: Progressing   Problem: Activity: Goal: Risk for activity intolerance will decrease Outcome: Progressing   Problem: Nutrition: Goal: Adequate nutrition will be maintained Outcome: Progressing   Problem: Coping: Goal: Level of anxiety will decrease Outcome: Progressing   Problem: Elimination: Goal: Will not experience complications related to bowel motility Outcome: Progressing Goal: Will not experience complications related to urinary retention Outcome: Progressing   Problem: Pain Managment: Goal: General experience of comfort will improve Outcome: Progressing   Problem: Safety: Goal: Ability to remain free from injury will improve Outcome: Progressing   Problem: Skin Integrity: Goal: Risk for impaired skin integrity will decrease Outcome: Progressing

## 2023-03-19 NOTE — Progress Notes (Signed)
Orthopedic Tech Progress Note Patient Details:  Raymond [name] [birth year] [id number]  Ortho Devices Type of Ortho Device: Prafo boot/shoe Ortho Device/Splint Location: Bi-lat Ortho Device/Splint Interventions: Ordered, Application, Adjustment   Post Interventions Patient Tolerated: Well Instructions Provided: Care of device  Tonye Pearson 03/19/2023, 3:22 PM

## 2023-03-19 NOTE — Progress Notes (Signed)
IP rehab admissions - I met with patient and his wife at the bedside.  Please see rehab consult done earlier by Dr. Riley Kill.  I gave wife rehab booklets and discussed CIR process and expectations.  I will have the case opened today with [employer] for potential CIR admission.  I will follow up again once I hear back from insurance case manager.  [phone]

## 2023-03-19 NOTE — Plan of Care (Signed)
  Problem: Pain Managment: Goal: General experience of comfort will improve Outcome: Progressing Note: Pain management progressing. At the beginning of 7am shift pt rated pain 10. RN administered PRN 1mg  dilaudid and reassessed pain within 1 hour, pain level decreased to 6, and pt stated he was sleepy so he rested. Upon reassessment pt pain level 3, he received scheduled baclofen and PRN acetaminophen. Pt agreeable to current pain regimen.

## 2023-03-19 NOTE — PMR Pre-admission (Shared)
PMR Admission Coordinator Pre-Admission Assessment  Patient: Raymond [name] is an 57 y.o., male MRN: [id number] DOB: 01/10/1966 Height: 5\' 11"  (180.3 cm) Weight: (!) 145.9 kg              Insurance Information HMO:     PPO: Yes     PCP:       IPA:       80/20:       OTHER:  Group [id number] PRIMARY: [employer] Managed      Policy#: [id number]      Subscriber: patient CM Name: Sofie      Phone#:      Fax#: [phone] Pre-Cert#: [id number] approved by Sofie for 7 days 03/20/23 to 03/25/24 with update due to Shelah Lewandowsky on 03/26/23 at fax [phone]     Employer: [employer] Benefits:  Phone #: [phone]     Name: Verified 03/19/23 Eff. Date: 11/01/22 to 06/02/23    Deduct: $3500 (met $2243.35)       Out of Pocket Max: $7000 (met [phone])      Life Max: n/a  CIR: 80%      SNF: 80% Outpatient: 80%     Co-Pay: 20% Home Health: 80%      Co-Pay: 20% DME: 80%     Co-Pay: 20% Providers: in network  SECONDARY:       Policy#:       Phone#:   Artist:       Phone#:   The Data processing manager" for patients in Inpatient Rehabilitation Facilities with attached "Privacy Act Statement-Health Care Records" was provided and verbally reviewed with: Patient  Emergency Contact Information Contact Information     Name Relation Home Work Mobile   Peoples,[name] Spouse [phone]        Other Contacts   None on File    Current Medical History  Patient Admitting Diagnosis: T10 HNP with thoracic myelopathy  History of Present Illness: A 57 y.o. male with a history of chronic low back pain, COPD and diabetes who apparently fell at home on Sunday, 03/08/2023.  He presented to the [facility] ED on 03/11/23 and then was transferred to [facility] on 03/12/23 for further workup.  Patient developed increasing back pain as well as lower extremity weakness and urinary incontinence.  RI revealed a large HNP at T10/T11.  Patient was seen by neurosurgery and  on 03/12/2023 a T10 laminectomy and discectomy was performed by Dr. Franky Macho.  Since the surgery patient does report some improvement in his back pain but continues to demonstrate lower extremity weakness and spasticity.  He has a Foley catheter in place.  He tells me that he had a bowel movement yesterday and was continent of it.  Patient was up with therapy yesterday and was max assist +2 for sit to stand transfers.  He did not ambulate.  Therapy notes spasticity as an issue.  Patient has been limited to wheelchair mobility since hospitalization in July.  Apparently he went to skilled nursing for therapy after this admission.  Since coming home he was standing for short periods of time with home health therapies, but otherwise was still using a wheelchair for mobility and self-care.  He lives at home with his spouse and has a ramp entry into home.   Glasgow Coma Scale Score: 15  Patient's medical record from [facility] has been reviewed by the rehabilitation admission coordinator and physician.  Past Medical History  Past Medical History:  Diagnosis Date   Anxiety    COPD (chronic obstructive pulmonary disease) (HCC)    Diabetes mellitus without complication (HCC)    Edema    GERD (gastroesophageal reflux disease)    Gout    Heavy smoker    Hyperlipidemia    Hypertension    Sleep apnea    uses a cpap    Has the patient had major surgery during 100 days prior to admission? Yes  Family History  family history includes Cancer in an other family member; Diabetes in his father and mother; Hypertension in his father and mother.   Current Medications   Current Facility-Administered Medications:    acetaminophen (TYLENOL) tablet 650 mg, 650 mg, Oral, Q6H PRN, 650 mg at 03/19/23 1129 **OR** acetaminophen (TYLENOL) suppository 650 mg, 650 mg, Rectal, Q6H PRN, Coletta Memos, MD   albuterol (PROVENTIL) (2.5 MG/3ML) 0.083% nebulizer solution 2.5 mg, 2.5 mg, Nebulization, Q2H PRN,  Coletta Memos, MD   allopurinol (ZYLOPRIM) tablet 300 mg, 300 mg, Oral, Daily, Coletta Memos, MD, 300 mg at 03/19/23 4696   ALPRAZolam Prudy Feeler) tablet 0.5 mg, 0.5 mg, Oral, QHS PRN, Patrici Ranks Caylin, PA-C, 0.5 mg at 03/18/23 2121   amLODipine (NORVASC) tablet 10 mg, 10 mg, Oral, Daily, Coletta Memos, MD, 10 mg at 03/19/23 0837   atorvastatin (LIPITOR) tablet 40 mg, 40 mg, Oral, Daily, Coletta Memos, MD, 40 mg at 03/19/23 0837   baclofen (LIORESAL) tablet 5 mg, 5 mg, Oral, BID, Faith Rogue T, MD, 5 mg at 03/19/23 1120   carvedilol (COREG) tablet 25 mg, 25 mg, Oral, BID WC, Coletta Memos, MD, 25 mg at 03/19/23 2952   Chlorhexidine Gluconate Cloth 2 % PADS 6 each, 6 each, Topical, Daily, Coletta Memos, MD, 6 each at 03/19/23 1120   cyclobenzaprine (FLEXERIL) tablet 10 mg, 10 mg, Oral, TID PRN, Clovis Riley, PA-C, 10 mg at 03/19/23 8413   doxazosin (CARDURA) tablet 2 mg, 2 mg, Oral, QHS, Coletta Memos, MD, 2 mg at 03/18/23 2215   DULoxetine (CYMBALTA) DR capsule 60 mg, 60 mg, Oral, Daily, Coletta Memos, MD, 60 mg at 03/19/23 0837   furosemide (LASIX) tablet 80 mg, 80 mg, Oral, BID, Coletta Memos, MD, 80 mg at 03/19/23 0838   glipiZIDE (GLUCOTROL) tablet 10 mg, 10 mg, Oral, QAC breakfast, Coletta Memos, MD, 10 mg at 03/19/23 2440   heparin injection 5,000 Units, 5,000 Units, Subcutaneous, Q8H, Coletta Memos, MD, 5,000 Units at 03/19/23 0612   HYDROmorphone (DILAUDID) injection 0.5-1 mg, 0.5-1 mg, Intravenous, Q2H PRN, Coletta Memos, MD, 1 mg at 03/19/23 0855   insulin aspart (novoLOG) injection 0-5 Units, 0-5 Units, Subcutaneous, QHS, Cabbell, Ronaldo Miyamoto, MD   insulin aspart (novoLOG) injection 0-9 Units, 0-9 Units, Subcutaneous, TID WC, Coletta Memos, MD, 1 Units at 03/19/23 1137   losartan (COZAAR) tablet 50 mg, 50 mg, Oral, Daily, Coletta Memos, MD, 50 mg at 03/19/23 1027   magnesium citrate solution 1 Bottle, 1 Bottle, Oral, Once PRN, Coletta Memos, MD   metFORMIN (GLUCOPHAGE-XR) 24 hr  tablet 1,000 mg, 1,000 mg, Oral, Q breakfast, Coletta Memos, MD, 1,000 mg at 03/19/23 0837   ondansetron (ZOFRAN) tablet 4 mg, 4 mg, Oral, Q6H PRN **OR** ondansetron (ZOFRAN) injection 4 mg, 4 mg, Intravenous, Q6H PRN, Coletta Memos, MD   oxyCODONE (Oxy IR/ROXICODONE) immediate release tablet 5 mg, 5 mg, Oral, Q4H PRN, Coletta Memos, MD, 5 mg at 03/18/23 1648   pantoprazole (PROTONIX) EC tablet 80 mg, 80 mg, Oral, Daily, Coletta Memos, MD, 80 mg at  03/19/23 0837   potassium chloride (KLOR-CON M) CR tablet 30 mEq, 30 mEq, Oral, BID, Coletta Memos, MD, 30 mEq at 03/19/23 6045   pramipexole (MIRAPEX) tablet 0.5 mg, 0.5 mg, Oral, BID, Coletta Memos, MD, 0.5 mg at 03/19/23 4098   senna-docusate (Senokot-S) tablet 1 tablet, 1 tablet, Oral, BID, Coletta Memos, MD, 1 tablet at 03/19/23 1191   tamsulosin (FLOMAX) capsule 0.4 mg, 0.4 mg, Oral, QHS, Coletta Memos, MD, 0.4 mg at 03/18/23 2120  Patients Current Diet:  Diet Order             Diet Carb Modified Fluid consistency: Thin; Room service appropriate? Yes with Assist  Diet effective now                   Precautions / Restrictions Precautions Precautions: Fall Restrictions Weight Bearing Restrictions: No   Has the patient had 2 or more falls or a fall with injury in the past year?Yes  Prior Activity Level Limited Community (1-2x/wk): Went out 2-3 X a week.  Prior Functional Level Prior Function Prior Level of Function : Needs assist Physical Assist : Mobility (physical), ADLs (physical) Mobility Comments: since ~July pt has been using WC for mobility, propelling with bil UE or Bil LE. pt uses slide board and assist from spouse for bed<>WC transfers, was able to do it alone after SNF stay but requires increased assistance now. ADLs Comments: uses bari [facility] with side platform to transfer WC<>BSC for toileting, bed baths with assist from spouse. pt able to feed self and perform oral care and shave.  Self Care: Did the patient need help  bathing, dressing, using the toilet or eating?  Needed some help  Indoor Mobility: Did the patient need assistance with walking from room to room (with or without device)? Needed some help  Stairs: Did the patient need assistance with internal or external stairs (with or without device)? Needed some help  Functional Cognition: Did the patient need help planning regular tasks such as shopping or remembering to take medications? Needed some help  Patient Information Are you of Hispanic, Latino/a,or Spanish origin?: A. No, not of Hispanic, Latino/a, or Spanish origin What is your race?: A. White Do you need or want an interpreter to communicate with a doctor or health care staff?: 0. No  Patient's Response To:  Health Literacy and Transportation Is the patient able to respond to health literacy and transportation needs?: Yes Health Literacy - How often do you need to have someone help you when you read instructions, pamphlets, or other written material from your doctor or pharmacy?: Always In the past 12 months, has lack of transportation kept you from medical appointments or from getting medications?: No In the past 12 months, has lack of transportation kept you from meetings, work, or from getting things needed for daily living?: No  Home Assistive Devices / Equipment Home Equipment: Agricultural consultant (2 wheels), BSC/3in1, Wheelchair - manual, Other (comment), Rollator (4 wheels)  Prior Device Use: Indicate devices/aids used by the patient prior to current illness, exacerbation or injury? Manual wheelchair and Scooter when in the grocery store  Current Functional Level Cognition  Overall Cognitive Status: Within Functional Limits for tasks assessed Orientation Level: Oriented X4 General Comments: jovial, motivated towards recovery    Extremity Assessment (includes Sensation/Coordination)  Upper Extremity Assessment: Overall WFL for tasks assessed  Lower Extremity Assessment: RLE  deficits/detail, LLE deficits/detail RLE Deficits / Details: pt with hypertonicity in hamstrngs and gastroc complex, strong babinski (+) and  clonus Rt>Lt. strength testing limited by tone, grossly 2/5 or less. Rt foot resting in significant plantarflexion and inversion with hypextension of great toe. RLE Sensation: decreased light touch, decreased proprioception RLE Coordination: decreased gross motor, decreased fine motor LLE Deficits / Details: pt with hypertonicity in hamstrings and gastroc complex, strong babinski (+) and clonus Rt>Lt. strength testing limited by tone, grossly 2/5 or less. Lt foot resting in significant plantarflexion and inversion with hypextension of great toe. LLE Sensation: decreased light touch, decreased proprioception LLE Coordination: decreased gross motor, decreased fine motor    ADLs  Overall ADL's : Needs assistance/impaired Eating/Feeding: Independent, Sitting, Bed level Grooming: Wash/dry hands, Wash/dry face, Oral care, Brushing hair, Set up, Sitting Grooming Details (indicate cue type and reason): on EOB Upper Body Bathing: Minimal assistance, Sitting Upper Body Bathing Details (indicate cue type and reason): on EOB Lower Body Bathing: Moderate assistance, Sitting/lateral leans Upper Body Dressing : Sitting, Minimal assistance Upper Body Dressing Details (indicate cue type and reason): gown change Lower Body Dressing: Moderate assistance, Sitting/lateral leans Lower Body Dressing Details (indicate cue type and reason): to donn shoes Toilet Transfer: Total assistance, +2 for physical assistance Toilet Transfer Details (indicate cue type and reason): bed level Toileting- Clothing Manipulation and Hygiene: Total assistance, +2 for physical assistance General ADL Comments: Reinforeced POB precautions, needs handout    Mobility  Overal bed mobility: Needs Assistance Bed Mobility: Rolling, Sidelying to Sit Rolling: Mod assist, +2 for safety/equipment, Used  rails Sidelying to sit: +2 for safety/equipment, Mod assist, Max assist General bed mobility comments: instructions on log rolling and and assistance with BLEs and trunk    Transfers  Overall transfer level: Needs assistance Equipment used: Ambulation equipment used Transfers: Sit to/from Stand, Bed to chair/wheelchair/BSC Sit to Stand: Max assist, +2 physical assistance, +2 safety/equipment, From elevated surface, Via lift equipment Bed to/from chair/wheelchair/BSC transfer type:: Via Financial planner via Lift Equipment: Stedy General transfer comment: sit to stands performed from EOB into stedy x2 and transferrred to recliner. 2 more sit to stands performed to straighten pad with limited standing tolerance and increased assistance    Ambulation / Gait / Stairs / Wheelchair Mobility       Posture / Balance Dynamic Sitting Balance Sitting balance - Comments: able to perform self care tasks seated on EOB Balance Overall balance assessment: Needs assistance Sitting-balance support: Feet supported, Bilateral upper extremity supported Sitting balance-Leahy Scale: Fair Sitting balance - Comments: able to perform self care tasks seated on EOB Standing balance support: Bilateral upper extremity supported, During functional activity, Reliant on assistive device for balance Standing balance-Leahy Scale: Poor Standing balance comment: reliant on stedy for support. performed 4 total stands into stedy with deminishing standing tolerance    Special needs/care consideration Skin Post op back incision and moisture irritation in abdominal folds, Diabetic management Yes DM on oral medications and receiving insulin in acute hospital, and Special service needs n/a     Previous Home Environment (from acute therapy documentation) Living Arrangements: Spouse/significant other Available Help at Discharge: Family, Available 24 hours/day Type of Home: House Home Layout: One level Home Access:  Ramped entrance Bathroom Shower/Tub: Engineer, manufacturing systems: Standard Bathroom Accessibility: No Home Care Services: Yes Type of Home Care Services: Home PT Additional Comments: pt has been limited to [facility] mobility since hospital admission in July and dc'd to August to SNF for 2 weeks of rehab. Since returning home pt has worked with [facility] therapy on transfers, has been able to stand for ~  3 mins on some visits with HHPT. Patient naps in recliner but sleeps in regular flat bed, uses CPAP when sleeping. Sunday of fall pt was trying to stand alone while spouse out of home.  Discharge Living Setting Plans for Discharge Living Setting: Patient's home, House, Lives with (comment) (Lives with wife.) Type of Home at Discharge: House Discharge Home Layout: One level Discharge Home Access: Ramped entrance (Ramp at the front entry) Discharge Bathroom Shower/Tub: Tub/shower unit, Curtain Discharge Bathroom Toilet: Standard Discharge Bathroom Accessibility: No Does the patient have any problems obtaining your medications?: No  Social/Family/Support Systems Patient Roles: Spouse, Other (Comment) (Has wife.  Step Dtr lives next door.) Contact Information: Tiney Rouge - wife - [phone] Anticipated Caregiver: wife Ability/Limitations of Caregiver: Wife is not working and can assist. Medical laboratory scientific officer: 24/7 Discharge Plan Discussed with Primary Caregiver: Yes Is Caregiver In Agreement with Plan?: Yes Does Caregiver/Family have Issues with Lodging/Transportation while Pt is in Rehab?: No   Goals Patient/Family Goal for Rehab: PT/OT supervision to min assist wheel chair level Expected length of stay: 17-24 days Pt/Family Agrees to Admission and willing to participate: Yes Program Orientation Provided & Reviewed with Pt/Caregiver Including Roles  & Responsibilities: Yes   Decrease burden of Care through IP rehab admission: N/A   Possible need for SNF placement upon discharge:  Not  anticipated   Patient Condition: This patient's medical and functional status has changed since the consult dated: 03/19/23 in which the Rehabilitation Physician determined and documented that the patient's condition is appropriate for intensive rehabilitative care in an inpatient rehabilitation facility. See "History of Present Illness" (above) for medical update. Functional changes are: Pt. Max A+2 with transfers. Patient's medical and functional status update has been discussed with the Rehabilitation physician and patient remains appropriate for inpatient rehabilitation. Will admit to inpatient rehab today.  Preadmission Screen Completed By:  Trish Mage, RN, 03/19/2023 2:39 PM ______________________________________________________________________   Discussed status with Dr. Riley Kill on 03/23/23 at 930 and received approval for admission today.  Admission Coordinator:  Trish Mage, time 130 Dorna Bloom 03/23/23

## 2023-03-19 NOTE — Consult Note (Addendum)
Physical Medicine and Rehabilitation Consult Reason for Consult:lower extremity weakness Referring Physician: Mikal Plane   HPI: Raymond [name] is a 57 y.o. male with a history of chronic low back pain, COPD and diabetes who apparently fell at home on Sunday, 03/08/2023.  Patient developed increasing back pain as well as lower extremity weakness and urinary incontinence.  RI revealed a large HNP at T10/T11.  Patient was seen by neurosurgery and on 03/12/2023 a T10 laminectomy and discectomy was performed by Dr. Franky Macho.  Since the surgery patient does report some improvement in his back pain but continues to demonstrate lower extremity weakness and spasticity.  He has a Foley catheter in place.  He tells me that he had a bowel movement yesterday and was continent of it.  Patient was up with therapy yesterday and was max assist +2 for sit to stand transfers.  He did not ambulate.  Therapy notes spasticity as an issue.   Patient has been limited to wheelchair mobility since hospitalization in July.  Apparently he went to skilled nursing for therapy after this admission.  Since coming home he was standing for short periods of time with home health therapies, but otherwise was still using a wheelchair for mobility and self-care.  He lives at home with his spouse and has a ramp entry into home.   Review of Systems  Constitutional:  Negative for fever.  Eyes: Negative.   Respiratory: Negative.    Cardiovascular: Negative.   Gastrointestinal: Negative.   Genitourinary:        Retention  Musculoskeletal:  Positive for back pain, falls and myalgias.  Skin:  Negative for rash.  Neurological:  Positive for focal weakness and weakness.  Psychiatric/Behavioral:  Negative for depression.    Past Medical History:  Diagnosis Date   Anxiety    COPD (chronic obstructive pulmonary disease) (HCC)    Diabetes mellitus without complication (HCC)    Edema    GERD (gastroesophageal reflux disease)    Gout     Heavy smoker    Hyperlipidemia    Hypertension    Sleep apnea    uses a cpap   Past Surgical History:  Procedure Laterality Date   CHONDROPLASTY Left 06/28/2014   Procedure: CHONDROPLASTY;  Surgeon: Thera Flake., MD;  Location: [facility];  Service: Orthopedics;  Laterality: Left;   COLONOSCOPY     FOOT FASCIOTOMY     age 37-rt   KNEE ARTHROSCOPY WITH LATERAL MENISECTOMY Left 06/28/2014   Procedure: KNEE ARTHROSCOPY WITH LATERAL MENISECTOMY;  Surgeon: Thera Flake., MD;  Location: [facility];  Service: Orthopedics;  Laterality: Left;   KNEE ARTHROSCOPY WITH MEDIAL MENISECTOMY Left 06/28/2014   Procedure: LEFT KNEE ARTHROSCOPY CHONDROPLASTY/WITH MEDIAL/LATERAL MENISECTOMIES;  Surgeon: Thera Flake., MD;  Location: [facility];  Service: Orthopedics;  Laterality: Left;   ORIF RADIUS & ULNA FRACTURES  2007   left   THORACIC DISCECTOMY N/A 03/12/2023   Procedure: THORACIC LAMINECTOMY AND  DISCECTOMY;  Surgeon: Coletta Memos, MD;  Location: [facility];  Service: Neurosurgery;  Laterality: N/A;   Family History  Problem Relation Age of Onset   Diabetes Mother    Hypertension Mother    Diabetes Father    Hypertension Father    Cancer Other    Social History:  reports that he has been smoking cigarettes. He has never used smokeless tobacco. He reports current alcohol use. He reports that he does not use  drugs. Allergies:  Allergies  Allergen Reactions   Sulfa Antibiotics Hives   Medications Prior to Admission  Medication Sig Dispense Refill   acetaminophen (TYLENOL) 500 MG tablet Take 1,000 mg by mouth every 6 (six) hours as needed for moderate pain.     albuterol (VENTOLIN HFA) 108 (90 Base) MCG/ACT inhaler Inhale 2 puffs into the lungs every 6 (six) hours as needed for wheezing or shortness of breath.     allopurinol (ZYLOPRIM) 300 MG tablet Take 300 mg by mouth daily.     ALPRAZolam (XANAX) 0.5 MG tablet Take 0.5 mg by mouth 2 (two) times  daily as needed.     amLODipine (NORVASC) 10 MG tablet Take 1 tablet (10 mg total) by mouth daily. (Patient taking differently: Take 10 mg by mouth every evening.)     Aspirin-Acetaminophen-Caffeine (GOODYS EXTRA STRENGTH) 520-260-32.5 MG PACK Take 1 packet by mouth every 6 (six) hours as needed (pain).     atorvastatin (LIPITOR) 40 MG tablet Take 40 mg by mouth every evening.     carvedilol (COREG) 25 MG tablet Take 25 mg by mouth in the morning and at bedtime.     cholecalciferol (CHOLECALCIFEROL) 25 MCG tablet Take 1 tablet (1,000 Units total) by mouth daily.     docusate sodium (COLACE) 100 MG capsule Take 1 capsule (100 mg total) by mouth 2 (two) times daily.     doxazosin (CARDURA) 2 MG tablet Take 2 mg by mouth at bedtime.     DULoxetine (CYMBALTA) 60 MG capsule Take 60 mg by mouth daily.     furosemide (LASIX) 80 MG tablet Take 80 mg by mouth 2 (two) times daily.     glipiZIDE (GLUCOTROL) 10 MG tablet Take 10 mg by mouth in the morning and at bedtime.     losartan (COZAAR) 50 MG tablet Take 50 mg by mouth in the morning and at bedtime.     magnesium hydroxide (MILK OF MAGNESIA) 400 MG/5ML suspension Take 30-60 mLs by mouth daily as needed for moderate constipation.     meloxicam (MOBIC) 15 MG tablet Take 15 mg by mouth daily.     metFORMIN (GLUCOPHAGE) 500 MG tablet Take 500 mg by mouth 2 (two) times daily with a meal.     metolazone (ZAROXOLYN) 2.5 MG tablet Take 2.5 mg by mouth daily as needed (edema). At lunch.     MOUNJARO 10 MG/0.5ML Pen Inject 10 mg into the skin once a week.     naproxen sodium (ALEVE) 220 MG tablet Take 220 mg by mouth 2 (two) times daily as needed (pain).     nystatin-triamcinolone (MYCOLOG II) cream Apply 1 application topically daily as needed (itch).      omeprazole (PRILOSEC) 40 MG capsule Take 40 mg by mouth daily.     polyethylene glycol (MIRALAX / GLYCOLAX) 17 g packet Take 17 g by mouth 2 (two) times daily. (Patient taking differently: Take 17 g by mouth  daily as needed for moderate constipation.)     Potassium Chloride ER 20 MEQ TBCR TAKE 1 & 1/2 (ONE & ONE-HALF) TABLETS BY MOUTH TWICE DAILY Oral for 30 Days     pramipexole (MIRAPEX) 0.5 MG tablet Take 0.5 mg by mouth in the morning and at bedtime.     senna-docusate (SENOKOT-S) 8.6-50 MG tablet Take 1 tablet by mouth 2 (two) times daily. (Patient taking differently: Take 1 tablet by mouth daily as needed for moderate constipation.)     tamsulosin (FLOMAX) 0.4 MG CAPS capsule  Take 0.4 mg by mouth at bedtime.      Home: Home Living Family/patient expects to be discharged to:: Private residence Living Arrangements: Spouse/significant other Available Help at Discharge: Family, Available 24 hours/day Type of Home: House Home Access: Ramped entrance Home Layout: One level Bathroom Shower/Tub: Engineer, manufacturing systems: Standard Bathroom Accessibility: No Home Equipment: Agricultural consultant (2 wheels), BSC/3in1, Wheelchair - manual, Other (comment), Rollator (4 wheels) Additional Comments: pt has been limited to [facility] mobility since hospital admission in July and dc'd to August to SNF for 2 weeks of rehab. Since returning home pt has worked with [facility] therapy on transfers, has been able to stand for ~3 mins on some visits with HHPT. Patient naps in recliner but sleeps in regular flat bed, uses CPAP when sleeping. Sunday of fall pt was trying to stand alone while spouse out of home.  Functional History: Prior Function Prior Level of Function : Needs assist Physical Assist : Mobility (physical), ADLs (physical) Mobility Comments: since ~July pt has been using WC for mobility, propelling with bil UE or Bil LE. pt uses slide board and assist from spouse for bed<>WC transfers, was able to do it alone after SNF stay but requires increased assistance now. ADLs Comments: uses bari [facility] with side platform to transfer WC<>BSC for toileting, bed baths with assist from spouse. pt able to feed self and perform oral  care and shave. Functional Status:  Mobility: Bed Mobility Overal bed mobility: Needs Assistance Bed Mobility: Rolling, Sidelying to Sit Rolling: Mod assist, +2 for safety/equipment, Used rails Sidelying to sit: +2 for safety/equipment, Mod assist, Max assist General bed mobility comments: instructions on log rolling and and assistance with BLEs and trunk Transfers Overall transfer level: Needs assistance Equipment used: Ambulation equipment used Transfers: Sit to/from Stand, Bed to chair/wheelchair/BSC Sit to Stand: Max assist, +2 physical assistance, +2 safety/equipment, From elevated surface, Via lift equipment Bed to/from chair/wheelchair/BSC transfer type:: Via Financial planner via Lift Equipment: Stedy General transfer comment: sit to stands performed from EOB into stedy x2 and transferrred to recliner. 2 more sit to stands performed to straighten pad with limited standing tolerance and increased assistance      ADL: ADL Overall ADL's : Needs assistance/impaired Eating/Feeding: Independent, Sitting, Bed level Grooming: Wash/dry hands, Wash/dry face, Oral care, Brushing hair, Set up, Sitting Grooming Details (indicate cue type and reason): on EOB Upper Body Bathing: Minimal assistance, Sitting Upper Body Bathing Details (indicate cue type and reason): on EOB Lower Body Bathing: Moderate assistance, Sitting/lateral leans Upper Body Dressing : Sitting, Minimal assistance Upper Body Dressing Details (indicate cue type and reason): gown change Lower Body Dressing: Moderate assistance, Sitting/lateral leans Lower Body Dressing Details (indicate cue type and reason): to donn shoes Toilet Transfer: Total assistance, +2 for physical assistance Toilet Transfer Details (indicate cue type and reason): bed level Toileting- Clothing Manipulation and Hygiene: Total assistance, +2 for physical assistance General ADL Comments: Reinforeced POB precautions, needs  handout  Cognition: Cognition Overall Cognitive Status: Within Functional Limits for tasks assessed Orientation Level: Oriented X4 Cognition Arousal: Alert Behavior During Therapy: WFL for tasks assessed/performed Overall Cognitive Status: Within Functional Limits for tasks assessed General Comments: jovial, motivated towards recovery  Blood pressure 126/65, pulse 63, temperature 97.8 F (36.6 C), temperature source Oral, resp. rate 18, height 5\' 11"  (1.803 m), weight (!) 145.9 kg, SpO2 94%. Physical Exam Constitutional:      Appearance: He is obese. He is not ill-appearing.  HENT:     Head:  Normocephalic.     Right Ear: External ear normal.     Left Ear: External ear normal.     Nose: Nose normal.     Mouth/Throat:     Mouth: Mucous membranes are moist.  Eyes:     Extraocular Movements: Extraocular movements intact.     Pupils: Pupils are equal, round, and reactive to light.  Cardiovascular:     Rate and Rhythm: Normal rate.  Pulmonary:     Effort: Pulmonary effort is normal.  Abdominal:     Palpations: Abdomen is soft.  Genitourinary:    Comments: Foley in place Musculoskeletal:        General: No swelling. Tenderness: LB tender.    Cervical back: Normal range of motion.  Skin:    General: Skin is warm.     Comments: Surgical incision dressed  Neurological:     Comments: Alert and oriented x 3. Normal insight and awareness. Intact Memory. Normal language and speech. Cranial nerve exam unremarkable. MMT: UE 5/5. RLE 3-/5 HF, 3- to 3/5 KE and 3+ to 4/5 ADF/PF. LLE 2+ HF, KE and 3+ ADF/PF. Seemed to sense LT and pain equally in all 4's. DTR's 3+ in LE's. 5-6 beats clonus in either foot with extensor tone present in both LE's.    Psychiatric:        Mood and Affect: Mood normal.        Behavior: Behavior normal.     Results for orders placed or performed during the hospital encounter of 03/11/23 (from the past 24 hour(s))  Glucose, capillary     Status: None    Collection Time: 03/18/23 11:48 AM  Result Value Ref Range   Glucose-Capillary 98 70 - 99 mg/dL  Glucose, capillary     Status: None   Collection Time: 03/18/23  4:07 PM  Result Value Ref Range   Glucose-Capillary 72 70 - 99 mg/dL  Glucose, capillary     Status: None   Collection Time: 03/18/23  9:08 PM  Result Value Ref Range   Glucose-Capillary 92 70 - 99 mg/dL  Glucose, capillary     Status: Abnormal   Collection Time: 03/19/23  7:38 AM  Result Value Ref Range   Glucose-Capillary 115 (H) 70 - 99 mg/dL   No results found.  Assessment/Plan: Diagnosis: 57 yo male with T10 HNP with resulting thoracic myelopathy. Does the need for close, 24 hr/day medical supervision in concert with the patient's rehab needs make it unreasonable for this patient to be served in a less intensive setting? Yes Co-Morbidities requiring supervision/potential complications:  -Pain mgt -spasticity -neurogenic bladder -neurogenic bowel Due to bladder management, bowel management, safety, skin/wound care, disease management, medication administration, pain management, and patient education, does the patient require 24 hr/day rehab nursing? Yes Does the patient require coordinated care of a physician, rehab nurse, therapy disciplines of PT, OT to address physical and functional deficits in the context of the above medical diagnosis(es)? Yes Addressing deficits in the following areas: balance, endurance, locomotion, strength, transferring, bowel/bladder control, bathing, dressing, feeding, grooming, toileting, and psychosocial support Can the patient actively participate in an intensive therapy program of at least 3 hrs of therapy per day at least 5 days per week? Yes The potential for patient to make measurable gains while on inpatient rehab is excellent Anticipated functional outcomes upon discharge from inpatient rehab are supervision and min assist  with PT, supervision and min assist with OT at a WHEEL CHAIR  LEVEL.  Estimated rehab length  of stay to reach the above functional goals is: 17-24 days Anticipated discharge destination: Home Overall Rehab/Functional Prognosis: excellent  POST ACUTE RECOMMENDATIONS: This patient's condition is appropriate for continued rehabilitative care in the following setting: CIR Patient has agreed to participate in recommended program. Yes Note that insurance prior authorization Abed be required for reimbursement for recommended care.  Comment: Rehab Admissions Coordinator to follow up.     MEDICAL RECOMMENDATIONS: Pt with significant LE extensor tone. Will initiate a trial of baclofen 5mg  po bid to start, titrating up as needed/tolerated.  Bilateral PRAFO's to stretch heel cords which are tight   I have personally performed a face to face diagnostic evaluation of this patient. Additionally, I have examined the patient's medical record including any pertinent labs and radiographic images. If the physician assistant has documented in this note, I have reviewed and edited or otherwise concur with the physician assistant's documentation.  Thanks,  Ranelle Oyster, MD 03/19/2023

## 2023-03-20 LAB — GLUCOSE, CAPILLARY
Glucose-Capillary: 106 mg/dL — ABNORMAL HIGH (ref 70–99)
Glucose-Capillary: 148 mg/dL — ABNORMAL HIGH (ref 70–99)
Glucose-Capillary: 149 mg/dL — ABNORMAL HIGH (ref 70–99)
Glucose-Capillary: 96 mg/dL (ref 70–99)

## 2023-03-20 NOTE — Progress Notes (Signed)
Occupational Therapy Treatment Patient Details Name: Raymond [name] MRN: [id number] DOB: Apr 17, 1966 Today's Date: 03/20/2023   History of present illness Patient is a 57 y.o. 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 He is now s/p T10 laminectomy and Discectomy. PMH of anxiety, COPD, DM, Edema, Gout, HTN, HLD, L medial menisectomy, ORIF L radius and ulna.   OT comments  Pt. Seen with PT for skilled therapy session.  Pt. Eager and motivated for participation.  Bed mobility max a with cues for log roll tech. For cont. Implementation of back precautions.  Sit/stand x2 max a x2.  Use of stedy for transfer to recliner and demo/return demo of exercises as part of HEP to promote cont. Strengthening in prep for increasing mobility and adl abilities.  Cues for PLB strategy and need for rest breaks when notably sob.  Pt. Receptive and able to return demo but required cues throughout session to implement when needed.  Pt. Is excellent candidate for cont. Intense level therapies prior to home.        If plan is discharge home, recommend the following:  Two people to help with walking and/or transfers;A lot of help with bathing/dressing/bathroom;Assistance with Ecologist;Hospital bed    Recommendations for Other Services Rehab consult    Precautions / Restrictions Precautions Precautions: Fall       Mobility Bed Mobility Overal bed mobility: Needs Assistance Bed Mobility: Rolling, Sidelying to Sit Rolling: Mod assist, +2 for safety/equipment, Used rails Sidelying to sit: Max assist       General bed mobility comments: instructions on log rolling and and assistance with BLEs and trunk    Transfers Overall transfer level: Needs assistance Equipment used: Ambulation equipment used Transfers: Sit to/from Stand, Bed to chair/wheelchair/BSC Sit to  Stand: Max assist, +2 physical assistance, +2 safety/equipment, From elevated surface, Via lift equipment           General transfer comment: sit/stand x2 from eob then utilized stedy to transfer to recliner. able to use bues and scoot back in recliner for better positioning. Transfer via Lift Equipment: [employer]                                           ADL either performed or assessed with clinical judgement   ADL Overall ADL's : Needs assistance/impaired                     Lower Body Dressing: Set up;Sitting/lateral leans Lower Body Dressing Details (indicate cue type and reason): pt. able to turn each direction in bed and bring LE in sideways to don socks and maintain back precautions during               General ADL Comments: reinforced back precautions and heavy review of PLB. pt. able to return demo but required cues to implement when needed along with rest breaks.    Extremity/Trunk Assessment              Vision       Perception     Praxis      Cognition Arousal: Alert Behavior During Therapy: WFL for tasks assessed/performed Overall Cognitive Status: Within Functional Limits for tasks assessed  General Comments: jovial, motivated towards recovery        Exercises Other Exercises Other Exercises: demo and return demo of chair push ups x5. cues for tech. and to push through feet, noted habit of leaning back and hip thrust forward to try and get up.  after education able to pull feet better to weight bear through fronts of feet and ease the motion of partial sit/stands. educated to complete throughout the day when  up    Shoulder Instructions       General Comments  Has a cat he loves named pig. Also a supportive wife "hunny bun"    Pertinent Vitals/ Pain       Pain Assessment Pain Assessment: Faces Faces Pain Scale: Hurts little more Pain Location: back Pain  Descriptors / Indicators: Aching, Discomfort Pain Intervention(s): Limited activity within patient's tolerance, Monitored during session, Repositioned  Home Living                                          Prior Functioning/Environment              Frequency  Min 1X/week        Progress Toward Goals  OT Goals(current goals can now be found in the care plan section)  Progress towards OT goals: Progressing toward goals     Plan      Co-evaluation    PT/OT/SLP Co-Evaluation/Treatment: Yes Reason for Co-Treatment: Complexity of the patient's impairments (multi-system involvement);For patient/therapist safety;To address functional/ADL transfers PT goals addressed during session: Mobility/safety with mobility;Balance;Strengthening/ROM;Proper use of DME OT goals addressed during session: ADL's and self-care;Proper use of Adaptive equipment and DME;Strengthening/ROM      AM-PAC OT "6 Clicks" Daily Activity     Outcome Measure   Help from another person eating meals?: None Help from another person taking care of personal grooming?: A Little Help from another person toileting, which includes using toliet, bedpan, or urinal?: Total Help from another person bathing (including washing, rinsing, drying)?: A Lot Help from another person to put on and taking off regular upper body clothing?: A Little Help from another person to put on and taking off regular lower body clothing?: Total 6 Click Score: 14    End of Session Equipment Utilized During Treatment: Other (comment) (stedy)  OT Visit Diagnosis: Unsteadiness on feet (R26.81);Other abnormalities of gait and mobility (R26.89);Pain   Activity Tolerance Patient tolerated treatment well   Patient Left in chair;with call bell/phone within reach   Nurse Communication Need for lift equipment;Mobility status        Time: 4098-1191 OT Time Calculation (min): 27 min  Charges: OT General Charges $OT Visit:  1 Visit OT Treatments $Self Care/Home Management : 8-22 mins  Boneta Lucks, COTA/L Acute Rehabilitation [phone]   Alessandra Bevels Lorraine-COTA/L 03/20/2023, 11:45 AM

## 2023-03-20 NOTE — Progress Notes (Signed)
Physical Therapy Treatment Patient Details Name: Raymond [name] MRN: [id number] DOB: [birth year] Today's Date: 03/20/2023   History of Present Illness Patient is a 57 y.o. 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 He is now s/p T10 laminectomy and Discectomy. PMH of anxiety, COPD, DM, Edema, Gout, HTN, HLD, L medial menisectomy, ORIF L radius and ulna.    PT Comments  Pt greeted resting in bed, eager for session and demonstrating good progress towards acute goals. Pt needing mod A +2 for log rolling and light min A with increased time and dense cues for hand placement to elevate trunk to sitting. Pt needing increased cues throughout mobility to maintain and recall all back precautions as pt with tendency to move quickly. Pt able to come to partial stand x2 from EOB with max A +2 and full upright standing in stedy frame x2. Pt performing LE therex seated up in chair at end of session. Current plan remains appropriate to address deficits and maximize functional independence and decrease caregiver burden. Pt continues to benefit from skilled PT services to progress toward functional mobility goals.      If plan is discharge home, recommend the following: Two people to help with walking and/or transfers;Two people to help with bathing/dressing/bathroom;Assistance with cooking/housework;Direct supervision/assist for medications management;Assist for transportation;Help with stairs or ramp for entrance   Can travel by private vehicle        Equipment Recommendations  Hospital bed;Wheelchair cushion (measurements PT);Wheelchair (measurements PT);Hoyer lift    Recommendations for Other Services       Precautions / Restrictions Precautions Precautions: Fall Restrictions Weight Bearing Restrictions: No     Mobility  Bed Mobility Overal bed mobility: Needs Assistance Bed Mobility: Rolling, Sidelying  to Sit Rolling: Mod assist, +2 for safety/equipment, Used rails Sidelying to sit: Min assist       General bed mobility comments: instructions on log rolling and and assistance with BLEs and light assist to elevate trunk    Transfers Overall transfer level: Needs assistance Equipment used: Ambulation equipment used Transfers: Sit to/from Stand, Bed to chair/wheelchair/BSC Sit to Stand: Max assist, +2 physical assistance, +2 safety/equipment, From elevated surface, Via lift equipment           General transfer comment: sit/stand x2 from EOB to RW then utilized stedy to transfer to recliner. able to use bues and scoot back in recliner for better positioning. Transfer via Lift Equipment: Stedy  Ambulation/Gait               General Gait Details: unable   Comptroller Bed    Modified Rankin (Stroke Patients Only)       Balance Overall balance assessment: Needs assistance Sitting-balance support: Feet supported, Bilateral upper extremity supported Sitting balance-Leahy Scale: Fair Sitting balance - Comments: able to perform self care tasks seated on EOB   Standing balance support: Bilateral upper extremity supported, During functional activity, Reliant on assistive device for balance Standing balance-Leahy Scale: Poor Standing balance comment: reliant on stedy for support. performed 4 total stands into stedy with deminishing standing tolerance                            Cognition Arousal: Alert Behavior During Therapy: [facility] for tasks assessed/performed Overall  Cognitive Status: Within Functional Limits for tasks assessed                                 General Comments: jovial, motivated towards recovery        Exercises General Exercises - Lower Extremity Long Arc Quad: AROM, Both, 10 reps, Seated Hip Flexion/Marching: AROM, Both, 10 reps, Seated Other Exercises Other Exercises: demo  and return demo of chair push ups x5. cues for tech. and to push through feet, noted habit of leaning back and hip thrust forward to try and get up.  after education able to pull feet better to weight bear through fronts of feet and ease the motion of partial sit/stands. educated to complete throughout the day when  up    General Comments        Pertinent Vitals/Pain Pain Assessment Pain Assessment: 0-10 Faces Pain Scale: Hurts whole lot Pain Location: back Pain Descriptors / Indicators: Aching, Discomfort Pain Intervention(s): Monitored during session, Limited activity within patient's tolerance, RN gave pain meds during session    Home Living                          Prior Function            PT Goals (current goals can now be found in the care plan section) Acute Rehab PT Goals Patient Stated Goal: rehab here PT Goal Formulation: With patient Time For Goal Achievement: 03/27/23 Progress towards PT goals: Progressing toward goals    Frequency    Min 1X/week      PT Plan      Co-evaluation PT/OT/SLP Co-Evaluation/Treatment: Yes Reason for Co-Treatment: Complexity of the patient's impairments (multi-system involvement);For patient/therapist safety;To address functional/ADL transfers PT goals addressed during session: Mobility/safety with mobility;Balance;Strengthening/ROM;Proper use of DME OT goals addressed during session: ADL's and self-care;Proper use of Adaptive equipment and DME;Strengthening/ROM      AM-PAC PT "6 Clicks" Mobility   Outcome Measure  Help needed turning from your back to your side while in a flat bed without using bedrails?: A Lot Help needed moving from lying on your back to sitting on the side of a flat bed without using bedrails?: Total Help needed moving to and from a bed to a chair (including a wheelchair)?: Total Help needed standing up from a chair using your arms (e.g., wheelchair or bedside chair)?: Total Help needed to walk  in hospital room?: Total Help needed climbing 3-5 steps with a railing? : Total 6 Click Score: 7    End of Session   Activity Tolerance: Patient tolerated treatment well Patient left: in chair;with call bell/phone within reach;with nursing/sitter in room Nurse Communication: Mobility status;Need for lift equipment (stedy for return to bed) PT Visit Diagnosis: Other abnormalities of gait and mobility (R26.89);Muscle weakness (generalized) (M62.81);Difficulty in walking, not elsewhere classified (R26.2);Other symptoms and signs involving the nervous system (R29.898);Pain Pain - part of body:  (back)     Time: 9147-8295 PT Time Calculation (min) (ACUTE ONLY): 29 min  Charges:    $Therapeutic Exercise: 8-22 mins PT General Charges $$ ACUTE PT VISIT: 1 Visit                     Jerremy Maione R. PTA Acute Rehabilitation Services Office: [phone]   Catalina Antigua 03/20/2023, 1:23 PM

## 2023-03-20 NOTE — Plan of Care (Signed)
  Problem: Activity: Goal: Ability to tolerate increased activity will improve Outcome: Progressing   Problem: Respiratory: Goal: Ability to maintain a clear airway will improve Outcome: Progressing   Problem: Coping: Goal: Ability to adjust to condition or change in health will improve Outcome: Progressing   Problem: Metabolic: Goal: Ability to maintain appropriate glucose levels will improve Outcome: Progressing   Problem: Skin Integrity: Goal: Risk for impaired skin integrity will decrease Outcome: Progressing   Problem: Nutrition: Goal: Adequate nutrition will be maintained Outcome: Progressing   Problem: Coping: Goal: Level of anxiety will decrease Outcome: Progressing   Problem: Safety: Goal: Ability to remain free from injury will improve Outcome: Progressing

## 2023-03-20 NOTE — Progress Notes (Signed)
IP rehab admissions - We have received approval for CIR admission.  No bed available on CIR for this patient today.  We will follow up on Monday for bed availability.  Call for questions.  [phone]

## 2023-03-20 NOTE — Plan of Care (Signed)
CHL Tonsillectomy/Adenoidectomy, Postoperative PEDS care plan entered in error.

## 2023-03-20 NOTE — Inpatient Diabetes Management (Signed)
Inpatient Diabetes Program Recommendations  AACE/ADA: New Consensus Statement on Inpatient Glycemic Control (2015)  Target Ranges:  Prepandial:   less than 140 mg/dL      Peak postprandial:   less than 180 mg/dL (1-2 hours)      Critically ill patients:  140 - 180 mg/dL   Lab Results  Component Value Date   GLUCAP 148 (H) 03/20/2023   HGBA1C 6.0 (H) 03/12/2023    Review of Glycemic Control  Latest Reference Range & Units 03/19/23 17:03 03/19/23 17:36 03/19/23 21:55 03/20/23 04:40  Glucose-Capillary 70 - 99 mg/dL 66 (L) 82 91 160 (H)  Diabetes history: DM2 Outpatient Diabetes medications: Glucotrol 10 mg bid, Metformin 500 mg bid, Mounjaro 10 mg weekly Current orders for Inpatient glycemic control: Glucotrol 10 mg daily, Metformin 1 gm am, Novolog 0-9 units tid with meals and HS Inpatient Diabetes Program Recommendations:    Note hypoglycemia on 10/17- [place] consider holding Glucotrol while in the hospital?   Thanks  Beryl Meager, RN, BC-ADM Inpatient Diabetes Coordinator Pager [phone]  (8a-5p)

## 2023-03-20 NOTE — TOC Progression Note (Signed)
Transition of Care [facility]) - Progression Note    Patient Details  Name: Raymond [name] MRN: [id number] Date of Birth: 02/10/1966  Transition of Care [facility]) CM/SW Contact  Mearl Latin, LCSW Phone Number: 03/20/2023, 1:53 PM  Clinical Narrative:    CIR awaiting insurance approval.   Expected Discharge Plan: IP Rehab Facility Barriers to Discharge: Continued Medical Work up  Expected Discharge Plan and Services       Living arrangements for the past 2 months: Single Family Home                                       Social Determinants of Health (SDOH) Interventions SDOH Screenings   Food Insecurity: No Food Insecurity (03/18/2023)  Housing: Low Risk  (03/18/2023)  Transportation Needs: No Transportation Needs (03/18/2023)  Utilities: Not At Risk (03/18/2023)  Social Connections: Unknown (01/10/2022)   Received from [facility], [facility]  Stress: No Stress Concern Present (03/21/2022)   Received from [facility], [facility]  Tobacco Use: High Risk (03/12/2023)    Readmission Risk Interventions    12/25/2022    4:24 PM  Readmission Risk Prevention Plan  Post Dischage Appt Complete  Medication Screening Complete  Transportation Screening Complete

## 2023-03-21 LAB — GLUCOSE, CAPILLARY
Glucose-Capillary: 100 mg/dL — ABNORMAL HIGH (ref 70–99)
Glucose-Capillary: 107 mg/dL — ABNORMAL HIGH (ref 70–99)
Glucose-Capillary: 87 mg/dL (ref 70–99)
Glucose-Capillary: 140 mg/dL — ABNORMAL HIGH (ref 70–99)

## 2023-03-22 LAB — GLUCOSE, CAPILLARY
Glucose-Capillary: 100 mg/dL — ABNORMAL HIGH (ref 70–99)
Glucose-Capillary: 167 mg/dL — ABNORMAL HIGH (ref 70–99)
Glucose-Capillary: 80 mg/dL (ref 70–99)
Glucose-Capillary: 132 mg/dL — ABNORMAL HIGH (ref 70–99)

## 2023-03-22 MED ORDER — DIPHENHYDRAMINE HCL 25 MG PO CAPS
25.0000 mg | ORAL_CAPSULE | ORAL | Status: DC | PRN
  Administered 2023-03-22 – 2023-03-23 (×2): 25 mg via ORAL
  Filled 2023-03-22 (×2): qty 1

## 2023-03-22 NOTE — Plan of Care (Signed)
  Problem: Respiratory: Goal: Ability to maintain a clear airway will improve Outcome: Progressing   Problem: Education: Goal: Ability to describe self-care measures that [place] prevent or decrease complications (Diabetes Survival Skills Education) will improve Outcome: Progressing   Problem: Coping: Goal: Ability to adjust to condition or change in health will improve Outcome: Progressing   Problem: Metabolic: Goal: Ability to maintain appropriate glucose levels will improve Outcome: Progressing

## 2023-03-22 NOTE — Progress Notes (Signed)
Pt refuses CPAP. 

## 2023-03-22 NOTE — Progress Notes (Signed)
Patient ID: Raymond [name], male   DOB: 04-Apr-1966, 57 y.o.   MRN: [id number] Patient still recovering from surgery awaiting placement in rehab.  Stable

## 2023-03-23 ENCOUNTER — Encounter (HOSPITAL_COMMUNITY): Payer: Self-pay | Admitting: Neurosurgery

## 2023-03-23 ENCOUNTER — Inpatient Hospital Stay (HOSPITAL_COMMUNITY)
Admission: AD | Admit: 2023-03-23 | Discharge: 2023-04-10 | DRG: 945 | Disposition: A | Discharge status: Home / Self Care | Payer: Managed Care, Other (non HMO) | Source: Intra-hospital | Attending: Physical Medicine and Rehabilitation | Admitting: Physical Medicine and Rehabilitation

## 2023-03-23 ENCOUNTER — Encounter (HOSPITAL_COMMUNITY): Payer: Self-pay | Admitting: Physical Medicine and Rehabilitation

## 2023-03-23 ENCOUNTER — Other Ambulatory Visit: Payer: Self-pay

## 2023-03-23 ENCOUNTER — Inpatient Hospital Stay (HOSPITAL_COMMUNITY): Payer: Managed Care, Other (non HMO)

## 2023-03-23 DIAGNOSIS — R600 Localized edema: Secondary | ICD-10-CM | POA: Diagnosis present

## 2023-03-23 DIAGNOSIS — Z8249 Family history of ischemic heart disease and other diseases of the circulatory system: Secondary | ICD-10-CM | POA: Diagnosis not present

## 2023-03-23 DIAGNOSIS — N401 Enlarged prostate with lower urinary tract symptoms: Secondary | ICD-10-CM | POA: Diagnosis present

## 2023-03-23 DIAGNOSIS — Z6841 Body Mass Index (BMI) 40.0 and over, adult: Secondary | ICD-10-CM

## 2023-03-23 DIAGNOSIS — E785 Hyperlipidemia, unspecified: Secondary | ICD-10-CM | POA: Diagnosis present

## 2023-03-23 DIAGNOSIS — R5381 Other malaise: Secondary | ICD-10-CM | POA: Diagnosis present

## 2023-03-23 DIAGNOSIS — G8929 Other chronic pain: Secondary | ICD-10-CM | POA: Insufficient documentation

## 2023-03-23 DIAGNOSIS — F17210 Nicotine dependence, cigarettes, uncomplicated: Secondary | ICD-10-CM | POA: Diagnosis present

## 2023-03-23 DIAGNOSIS — M109 Gout, unspecified: Secondary | ICD-10-CM | POA: Diagnosis present

## 2023-03-23 DIAGNOSIS — J449 Chronic obstructive pulmonary disease, unspecified: Secondary | ICD-10-CM | POA: Diagnosis present

## 2023-03-23 DIAGNOSIS — E876 Hypokalemia: Secondary | ICD-10-CM | POA: Diagnosis present

## 2023-03-23 DIAGNOSIS — D72829 Elevated white blood cell count, unspecified: Secondary | ICD-10-CM | POA: Diagnosis present

## 2023-03-23 DIAGNOSIS — E1122 Type 2 diabetes mellitus with diabetic chronic kidney disease: Secondary | ICD-10-CM | POA: Diagnosis present

## 2023-03-23 DIAGNOSIS — R627 Adult failure to thrive: Secondary | ICD-10-CM | POA: Diagnosis present

## 2023-03-23 DIAGNOSIS — Z79899 Other long term (current) drug therapy: Secondary | ICD-10-CM

## 2023-03-23 DIAGNOSIS — M4714 Other spondylosis with myelopathy, thoracic region: Principal | ICD-10-CM | POA: Diagnosis present

## 2023-03-23 DIAGNOSIS — E8809 Other disorders of plasma-protein metabolism, not elsewhere classified: Secondary | ICD-10-CM | POA: Diagnosis present

## 2023-03-23 DIAGNOSIS — I129 Hypertensive chronic kidney disease with stage 1 through stage 4 chronic kidney disease, or unspecified chronic kidney disease: Secondary | ICD-10-CM | POA: Diagnosis present

## 2023-03-23 DIAGNOSIS — N1831 Chronic kidney disease, stage 3a: Secondary | ICD-10-CM | POA: Diagnosis present

## 2023-03-23 DIAGNOSIS — N319 Neuromuscular dysfunction of bladder, unspecified: Secondary | ICD-10-CM | POA: Diagnosis present

## 2023-03-23 DIAGNOSIS — F419 Anxiety disorder, unspecified: Secondary | ICD-10-CM | POA: Diagnosis present

## 2023-03-23 DIAGNOSIS — Z833 Family history of diabetes mellitus: Secondary | ICD-10-CM | POA: Diagnosis not present

## 2023-03-23 DIAGNOSIS — R0789 Other chest pain: Secondary | ICD-10-CM | POA: Diagnosis not present

## 2023-03-23 DIAGNOSIS — E119 Type 2 diabetes mellitus without complications: Secondary | ICD-10-CM

## 2023-03-23 DIAGNOSIS — K592 Neurogenic bowel, not elsewhere classified: Secondary | ICD-10-CM | POA: Diagnosis present

## 2023-03-23 DIAGNOSIS — F54 Psychological and behavioral factors associated with disorders or diseases classified elsewhere: Secondary | ICD-10-CM

## 2023-03-23 DIAGNOSIS — I10 Essential (primary) hypertension: Secondary | ICD-10-CM | POA: Diagnosis present

## 2023-03-23 DIAGNOSIS — G4733 Obstructive sleep apnea (adult) (pediatric): Secondary | ICD-10-CM

## 2023-03-23 DIAGNOSIS — Z882 Allergy status to sulfonamides status: Secondary | ICD-10-CM

## 2023-03-23 DIAGNOSIS — D649 Anemia, unspecified: Secondary | ICD-10-CM | POA: Insufficient documentation

## 2023-03-23 DIAGNOSIS — K219 Gastro-esophageal reflux disease without esophagitis: Secondary | ICD-10-CM | POA: Insufficient documentation

## 2023-03-23 DIAGNOSIS — N189 Chronic kidney disease, unspecified: Secondary | ICD-10-CM | POA: Insufficient documentation

## 2023-03-23 DIAGNOSIS — I82403 Acute embolism and thrombosis of unspecified deep veins of lower extremity, bilateral: Secondary | ICD-10-CM | POA: Insufficient documentation

## 2023-03-23 DIAGNOSIS — K5900 Constipation, unspecified: Secondary | ICD-10-CM | POA: Diagnosis present

## 2023-03-23 DIAGNOSIS — N179 Acute kidney failure, unspecified: Secondary | ICD-10-CM | POA: Insufficient documentation

## 2023-03-23 DIAGNOSIS — Z7985 Long-term (current) use of injectable non-insulin antidiabetic drugs: Secondary | ICD-10-CM

## 2023-03-23 DIAGNOSIS — Z7984 Long term (current) use of oral hypoglycemic drugs: Secondary | ICD-10-CM

## 2023-03-23 DIAGNOSIS — M623 Immobility syndrome (paraplegic): Secondary | ICD-10-CM | POA: Diagnosis not present

## 2023-03-23 LAB — GLUCOSE, CAPILLARY
Glucose-Capillary: 122 mg/dL — ABNORMAL HIGH (ref 70–99)
Glucose-Capillary: 101 mg/dL — ABNORMAL HIGH (ref 70–99)
Glucose-Capillary: 121 mg/dL — ABNORMAL HIGH (ref 70–99)
Glucose-Capillary: 76 mg/dL (ref 70–99)

## 2023-03-23 MED ORDER — ALLOPURINOL 300 MG PO TABS
300.0000 mg | ORAL_TABLET | Freq: Every day | ORAL | Status: DC
  Administered 2023-03-24 – 2023-04-10 (×18): 300 mg via ORAL
  Filled 2023-03-23 (×18): qty 1

## 2023-03-23 MED ORDER — LIDOCAINE 5 % EX PTCH
2.0000 | MEDICATED_PATCH | Freq: Every day | CUTANEOUS | Status: DC
  Administered 2023-03-23 – 2023-04-09 (×12): 2 via TRANSDERMAL
  Filled 2023-03-23 (×14): qty 2

## 2023-03-23 MED ORDER — ALPRAZOLAM 0.5 MG PO TABS
0.5000 mg | ORAL_TABLET | Freq: Every evening | ORAL | Status: DC | PRN
  Administered 2023-03-23 – 2023-04-06 (×9): 0.5 mg via ORAL
  Filled 2023-03-23 (×9): qty 1

## 2023-03-23 MED ORDER — POTASSIUM CHLORIDE CRYS ER 20 MEQ PO TBCR
30.0000 meq | EXTENDED_RELEASE_TABLET | Freq: Two times a day (BID) | ORAL | Status: DC
  Administered 2023-03-23 – 2023-03-26 (×6): 30 meq via ORAL
  Filled 2023-03-23 (×6): qty 1

## 2023-03-23 MED ORDER — ALBUTEROL SULFATE (2.5 MG/3ML) 0.083% IN NEBU: 2.5000 mg | INHALATION_SOLUTION | RESPIRATORY_TRACT | Status: DC | PRN

## 2023-03-23 MED ORDER — SENNOSIDES-DOCUSATE SODIUM 8.6-50 MG PO TABS
2.0000 | ORAL_TABLET | Freq: Every morning | ORAL | Status: DC
  Administered 2023-03-24 – 2023-04-09 (×17): 2 via ORAL
  Filled 2023-03-23 (×20): qty 2

## 2023-03-23 MED ORDER — GUAIFENESIN-DM 100-10 MG/5ML PO SYRP
5.0000 mL | ORAL_SOLUTION | Freq: Four times a day (QID) | ORAL | Status: DC | PRN
Start: 2023-03-23 — End: 2023-04-10
  Administered 2023-04-01: 10 mL via ORAL
  Filled 2023-03-23 (×2): qty 10

## 2023-03-23 MED ORDER — OXYCODONE HCL 5 MG PO TABS
5.0000 mg | ORAL_TABLET | ORAL | Status: DC | PRN
  Administered 2023-03-25 – 2023-04-10 (×6): 5 mg via ORAL
  Filled 2023-03-23 (×9): qty 1

## 2023-03-23 MED ORDER — PROCHLORPERAZINE EDISYLATE 10 MG/2ML IJ SOLN: 5.0000 mg | Freq: Four times a day (QID) | INTRAMUSCULAR | Status: DC | PRN

## 2023-03-23 MED ORDER — CARVEDILOL 25 MG PO TABS
25.0000 mg | ORAL_TABLET | Freq: Two times a day (BID) | ORAL | Status: DC
  Administered 2023-03-24 – 2023-04-10 (×35): 25 mg via ORAL
  Filled 2023-03-23 (×28): qty 1
  Filled 2023-03-23: qty 2
  Filled 2023-03-23 (×6): qty 1

## 2023-03-23 MED ORDER — BISACODYL 10 MG RE SUPP
10.0000 mg | Freq: Every day | RECTAL | Status: DC
  Administered 2023-03-24 – 2023-03-27 (×2): 10 mg via RECTAL
  Filled 2023-03-23 (×2): qty 1

## 2023-03-23 MED ORDER — SENNOSIDES-DOCUSATE SODIUM 8.6-50 MG PO TABS
1.0000 | ORAL_TABLET | Freq: Two times a day (BID) | ORAL | Status: DC
  Administered 2023-03-23: 1 via ORAL

## 2023-03-23 MED ORDER — DOXAZOSIN MESYLATE 2 MG PO TABS
2.0000 mg | ORAL_TABLET | Freq: Every day | ORAL | Status: DC
  Administered 2023-03-23 – 2023-04-09 (×18): 2 mg via ORAL
  Filled 2023-03-23 (×18): qty 1

## 2023-03-23 MED ORDER — OXYCODONE HCL 5 MG PO TABS
10.0000 mg | ORAL_TABLET | Freq: Three times a day (TID) | ORAL | Status: DC
  Administered 2023-03-23 – 2023-04-09 (×51): 10 mg via ORAL
  Filled 2023-03-23 (×52): qty 2

## 2023-03-23 MED ORDER — BACLOFEN 5 MG HALF TABLET
5.0000 mg | ORAL_TABLET | Freq: Three times a day (TID) | ORAL | Status: DC
  Administered 2023-03-24 – 2023-03-30 (×21): 5 mg via ORAL
  Filled 2023-03-23 (×23): qty 1

## 2023-03-23 MED ORDER — FLEET ENEMA RE ENEM: 1.0000 | ENEMA | Freq: Once | RECTAL | Status: DC | PRN

## 2023-03-23 MED ORDER — PROCHLORPERAZINE MALEATE 5 MG PO TABS
5.0000 mg | ORAL_TABLET | Freq: Four times a day (QID) | ORAL | Status: DC | PRN
  Administered 2023-04-02: 10 mg via ORAL
  Filled 2023-03-23: qty 2

## 2023-03-23 MED ORDER — SORBITOL 70 % SOLN
45.0000 mL | Freq: Once | Status: AC
  Administered 2023-03-23: 45 mL via ORAL
  Filled 2023-03-23: qty 60

## 2023-03-23 MED ORDER — PRAMIPEXOLE DIHYDROCHLORIDE 0.25 MG PO TABS
0.5000 mg | ORAL_TABLET | Freq: Two times a day (BID) | ORAL | Status: DC
  Administered 2023-03-23 – 2023-04-10 (×36): 0.5 mg via ORAL
  Filled 2023-03-23 (×37): qty 2

## 2023-03-23 MED ORDER — METFORMIN HCL ER 500 MG PO TB24
1000.0000 mg | ORAL_TABLET | Freq: Every day | ORAL | Status: DC
  Administered 2023-03-24 – 2023-04-04 (×12): 1000 mg via ORAL
  Filled 2023-03-23 (×13): qty 2

## 2023-03-23 MED ORDER — DIPHENHYDRAMINE HCL 25 MG PO CAPS
25.0000 mg | ORAL_CAPSULE | Freq: Four times a day (QID) | ORAL | Status: DC | PRN
  Administered 2023-03-25 – 2023-04-05 (×4): 25 mg via ORAL
  Filled 2023-03-23 (×4): qty 1

## 2023-03-23 MED ORDER — ACETAMINOPHEN 325 MG PO TABS
325.0000 mg | ORAL_TABLET | ORAL | Status: DC | PRN
  Administered 2023-03-25: 650 mg via ORAL
  Filled 2023-03-23 (×2): qty 2

## 2023-03-23 MED ORDER — ALUM & MAG HYDROXIDE-SIMETH 200-200-20 MG/5ML PO SUSP: 30.0000 mL | ORAL | Status: DC | PRN

## 2023-03-23 MED ORDER — CYCLOBENZAPRINE HCL 10 MG PO TABS: 10.0000 mg | ORAL_TABLET | Freq: Three times a day (TID) | ORAL | Status: DC | PRN

## 2023-03-23 MED ORDER — PANTOPRAZOLE SODIUM 40 MG PO TBEC
80.0000 mg | DELAYED_RELEASE_TABLET | Freq: Every day | ORAL | Status: DC
  Administered 2023-03-24 – 2023-04-10 (×18): 80 mg via ORAL
  Filled 2023-03-23 (×19): qty 2

## 2023-03-23 MED ORDER — INSULIN ASPART 100 UNIT/ML IJ SOLN: 0.0000 [IU] | Freq: Every day | INTRAMUSCULAR | Status: DC

## 2023-03-23 MED ORDER — AMLODIPINE BESYLATE 10 MG PO TABS
10.0000 mg | ORAL_TABLET | Freq: Every day | ORAL | Status: DC
  Administered 2023-03-24 – 2023-03-26 (×3): 10 mg via ORAL
  Filled 2023-03-23 (×3): qty 1

## 2023-03-23 MED ORDER — TIRZEPATIDE 10 MG/0.5ML ~~LOC~~ SOAJ: 10.0000 mg | SUBCUTANEOUS | Status: DC

## 2023-03-23 MED ORDER — BACLOFEN 5 MG HALF TABLET
5.0000 mg | ORAL_TABLET | Freq: Two times a day (BID) | ORAL | Status: DC
  Administered 2023-03-23: 5 mg via ORAL

## 2023-03-23 MED ORDER — CHLORHEXIDINE GLUCONATE CLOTH 2 % EX PADS
6.0000 | MEDICATED_PAD | Freq: Every day | CUTANEOUS | Status: DC
  Administered 2023-03-24: 6 via TOPICAL

## 2023-03-23 MED ORDER — ATORVASTATIN CALCIUM 40 MG PO TABS
40.0000 mg | ORAL_TABLET | Freq: Every day | ORAL | Status: DC
  Administered 2023-03-24 – 2023-04-09 (×17): 40 mg via ORAL
  Filled 2023-03-23 (×17): qty 1

## 2023-03-23 MED ORDER — INSULIN ASPART 100 UNIT/ML IJ SOLN: 0.0000 [IU] | Freq: Three times a day (TID) | INTRAMUSCULAR | Status: DC

## 2023-03-23 MED ORDER — TAMSULOSIN HCL 0.4 MG PO CAPS
0.4000 mg | ORAL_CAPSULE | Freq: Every day | ORAL | Status: DC
  Administered 2023-03-23 – 2023-04-09 (×18): 0.4 mg via ORAL
  Filled 2023-03-23 (×18): qty 1

## 2023-03-23 MED ORDER — LIDOCAINE HCL URETHRAL/MUCOSAL 2 % EX GEL: CUTANEOUS | Status: DC | PRN

## 2023-03-23 MED ORDER — MELATONIN 5 MG PO TABS
5.0000 mg | ORAL_TABLET | Freq: Every evening | ORAL | Status: DC | PRN
  Administered 2023-03-26 – 2023-03-29 (×2): 5 mg via ORAL
  Filled 2023-03-23 (×2): qty 1

## 2023-03-23 MED ORDER — PROCHLORPERAZINE 25 MG RE SUPP: 12.5000 mg | Freq: Four times a day (QID) | RECTAL | Status: DC | PRN

## 2023-03-23 MED ORDER — FUROSEMIDE 40 MG PO TABS
80.0000 mg | ORAL_TABLET | Freq: Two times a day (BID) | ORAL | Status: DC
  Administered 2023-03-24 – 2023-03-25 (×3): 80 mg via ORAL
  Filled 2023-03-23 (×3): qty 2

## 2023-03-23 MED ORDER — DULOXETINE HCL 60 MG PO CPEP
60.0000 mg | ORAL_CAPSULE | Freq: Every day | ORAL | Status: DC
  Administered 2023-03-24 – 2023-04-03 (×11): 60 mg via ORAL
  Filled 2023-03-23 (×11): qty 1

## 2023-03-23 MED ORDER — LOSARTAN POTASSIUM 50 MG PO TABS
50.0000 mg | ORAL_TABLET | Freq: Every day | ORAL | Status: DC
  Administered 2023-03-24 – 2023-04-04 (×12): 50 mg via ORAL
  Filled 2023-03-23 (×12): qty 1

## 2023-03-23 MED ORDER — GLIPIZIDE 10 MG PO TABS
10.0000 mg | ORAL_TABLET | Freq: Every day | ORAL | Status: DC
  Administered 2023-03-24 – 2023-04-03 (×11): 10 mg via ORAL
  Filled 2023-03-23 (×11): qty 1

## 2023-03-23 NOTE — Progress Notes (Signed)
Physical Therapy Treatment Patient Details Name: Raymond [name] MRN: [id number] DOB: July 18, 1965 Today's Date: 03/23/2023   History of Present Illness Patient is a 57 y.o. 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 He is now s/p T10 laminectomy and Discectomy. PMH of anxiety, COPD, DM, Edema, Gout, HTN, HLD, L medial menisectomy, ORIF L radius and ulna.    PT Comments  Pt greeted resting in bed, eager for mobility and demonstrating continued progress towards acute goals. Pt with poor recall for all spinal precautions needing consistent cues to maintain throughout mobility. Pt requiring grossly min A for bed mobility and mod A to power up to stand in stedy frame. Pt able to maintain full upright standing for increased time this session ~30 seconds before max fatigue. Pt able to perform x3. Pt performing seated LE therex with cues for technique. Pt agreeable to time up in chair at end of session. Current plan remains appropriate to address deficits and maximize functional independence and decrease caregiver burden. Pt continues to benefit from skilled PT services to progress toward functional mobility goals.     If plan is discharge home, recommend the following: Two people to help with walking and/or transfers;Two people to help with bathing/dressing/bathroom;Assistance with cooking/housework;Direct supervision/assist for medications management;Assist for transportation;Help with stairs or ramp for entrance   Can travel by private vehicle        Equipment Recommendations  Hospital bed;Wheelchair cushion (measurements PT);Wheelchair (measurements PT);Hoyer lift    Recommendations for Other Services       Precautions / Restrictions Precautions Precautions: Fall Restrictions Weight Bearing Restrictions: No     Mobility  Bed Mobility Overal bed mobility: Needs Assistance Bed Mobility: Rolling,  Sidelying to Sit Rolling: Mod assist, Used rails Sidelying to sit: Min assist       General bed mobility comments: instructions on log rolling and and assistance with BLEs and light assist to elevate trunk    Transfers Overall transfer level: Needs assistance Equipment used: Ambulation equipment used Transfers: Sit to/from Stand, Bed to chair/wheelchair/BSC Sit to Stand: Mod assist, From elevated surface, Via lift equipment           General transfer comment: standing from elevated EOB x1 and stedy pads x2. pt able to maintain upright standing ~30 seconds each trial Transfer via Lift Equipment: Stedy  Ambulation/Gait               General Gait Details: unable   Stairs             Wheelchair Mobility     Tilt Bed    Modified Rankin (Stroke Patients Only)       Balance Overall balance assessment: Needs assistance Sitting-balance support: Feet supported, Bilateral upper extremity supported Sitting balance-Leahy Scale: Fair Sitting balance - Comments: able to perform self care tasks seated on EOB   Standing balance support: Bilateral upper extremity supported, During functional activity, Reliant on assistive device for balance Standing balance-Leahy Scale: Poor Standing balance comment: reliant on stedy for support. performed 3 total stands for increased time this session                            Cognition Arousal: Alert Behavior During Therapy: WFL for tasks assessed/performed Overall Cognitive Status: Within Functional Limits for tasks assessed  General Comments: jovial, motivated towards recovery        Exercises General Exercises - Lower Extremity Long Arc Quad: AROM, Both, 10 reps, Seated Hip Flexion/Marching: AROM, Both, 10 reps, Seated    General Comments General comments (skin integrity, edema, etc.): VSS on RA, pt spouse present during session and supportive      Pertinent  Vitals/Pain Pain Assessment Pain Assessment: Faces Faces Pain Scale: Hurts little more Pain Location: back Pain Descriptors / Indicators: Aching, Discomfort Pain Intervention(s): Monitored during session, Limited activity within patient's tolerance, Repositioned    Home Living                          Prior Function            PT Goals (current goals can now be found in the care plan section) Acute Rehab PT Goals Patient Stated Goal: rehab here PT Goal Formulation: With patient Time For Goal Achievement: 03/27/23 Progress towards PT goals: Progressing toward goals    Frequency    Min 1X/week      PT Plan      Co-evaluation              AM-PAC PT "6 Clicks" Mobility   Outcome Measure  Help needed turning from your back to your side while in a flat bed without using bedrails?: A Lot Help needed moving from lying on your back to sitting on the side of a flat bed without using bedrails?: Total Help needed moving to and from a bed to a chair (including a wheelchair)?: Total Help needed standing up from a chair using your arms (e.g., wheelchair or bedside chair)?: Total Help needed to walk in hospital room?: Total Help needed climbing 3-5 steps with a railing? : Total 6 Click Score: 7    End of Session Equipment Utilized During Treatment: Gait belt Activity Tolerance: Patient tolerated treatment well Patient left: in chair;with call bell/phone within reach;with nursing/sitter in room Nurse Communication: Mobility status;Need for lift equipment (stedy for return to bed) PT Visit Diagnosis: Other abnormalities of gait and mobility (R26.89);Muscle weakness (generalized) (M62.81);Difficulty in walking, not elsewhere classified (R26.2);Other symptoms and signs involving the nervous system (R29.898);Pain Pain - part of body:  (back)     Time: 8295-6213 PT Time Calculation (min) (ACUTE ONLY): 24 min  Charges:    $Therapeutic Exercise: 8-22  mins $Therapeutic Activity: 8-22 mins PT General Charges $$ ACUTE PT VISIT: 1 Visit                     Tandre Conly R. PTA Acute Rehabilitation Services Office: [phone]   Catalina Antigua 03/23/2023, 2:17 PM

## 2023-03-23 NOTE — Progress Notes (Signed)
Inpatient Rehab Admissions Coordinator:    I Have a CIR bed for this Pt. Today. RN [place] call report to [phone].   Pt. To admit to CIR for an estimated 18-21 days of intensive rehab with the goal of reaching supervision to min A level and returning home with the assistance of his wife and paid caregivers.    Megan Salon, MS, CCC-SLP Rehab Admissions Coordinator  [phone] (celll) [phone] (office)

## 2023-03-23 NOTE — H&P (Signed)
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 He ws found to have large HNP at T10/T11 and underwent T10 laminectomy with diskectomy by Dr. Franky Macho on 09/10. Post op he had issues with poor pain control and has been using IV dilaudid with oxycodone prn. Foley placed in ED and remains in place. Po intake has been good but has not had BM for 3 days and usually goes daily. Has been refusing CPAP but compliant at home.  Sensation bilateral feet and ankle are improving. PT/OT has been working with patient who requires Mod assist with Stedy to stand and min assist with ADLs. He was independent and working prior to this summer.  CIR recommended due to functional decline.    Review of Systems  HENT:  Negative for hearing loss.   Eyes:  Negative for blurred vision and double vision.  Respiratory:  Positive for cough. Negative for shortness of breath.   Cardiovascular:  Negative for chest pain and leg swelling.  Gastrointestinal:  Positive for constipation. Negative for abdominal pain and heartburn.  Genitourinary:  Negative for dysuria.       Used to get up 4-5 times at night for years  Musculoskeletal:  Positive for back pain.  Neurological:  Positive for sensory change (bilateral thighs still numb) and weakness. Negative for dizziness and headaches.  Psychiatric/Behavioral:  Negative for hallucinations. The patient has insomnia.      Past Medical History:  Diagnosis Date   Anxiety    COPD (chronic obstructive pulmonary disease) (HCC)    Diabetes mellitus without complication (HCC)    Edema    GERD (gastroesophageal reflux disease)    Gout    Heavy smoker    Hyperlipidemia     Hypertension    Sleep apnea    uses a cpap    Past Surgical History:  Procedure Laterality Date   CHONDROPLASTY Left 06/28/2014   Procedure: CHONDROPLASTY;  Surgeon: Thera Flake., MD;  Location: [facility];  Service: Orthopedics;  Laterality: Left;   COLONOSCOPY     FOOT FASCIOTOMY     age 52-rt   KNEE ARTHROSCOPY WITH LATERAL MENISECTOMY Left 06/28/2014   Procedure: KNEE ARTHROSCOPY WITH LATERAL MENISECTOMY;  Surgeon: Thera Flake., MD;  Location: [facility];  Service: Orthopedics;  Laterality: Left;   KNEE ARTHROSCOPY WITH MEDIAL MENISECTOMY Left 06/28/2014   Procedure: LEFT KNEE ARTHROSCOPY CHONDROPLASTY/WITH MEDIAL/LATERAL MENISECTOMIES;  Surgeon: Thera Flake., MD;  Location:  SURGERY CENTER;  Service: Orthopedics;  Laterality: Left;   ORIF RADIUS & ULNA FRACTURES  2007   left   THORACIC DISCECTOMY N/A 03/12/2023   Procedure: THORACIC LAMINECTOMY AND  DISCECTOMY;  Surgeon: Coletta Memos, MD;  Location: [facility] OR;  Service: Neurosurgery;  Laterality: N/A;    Family History  Problem Relation Age of Onset   Diabetes Mother    Hypertension Mother    Diabetes Father    Hypertension Father    Cancer Other     Social History:  Married. 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 He reports that he has been smoking cigarettes--2 PPD. He has never used smokeless tobacco. He  reports current alcohol use--wine on the weekends. He reports that he does not use drugs.   Allergies  Allergen Reactions   Sulfa Antibiotics Hives    Medications Prior to Admission  Medication Sig Dispense Refill   acetaminophen (TYLENOL) 500 MG tablet Take 1,000 mg by mouth every 6 (six) hours as needed for moderate pain.     albuterol (VENTOLIN HFA) 108 (90 Base) MCG/ACT inhaler Inhale 2 puffs into the lungs every 6 (six) hours as needed for wheezing or shortness of breath.     allopurinol (ZYLOPRIM) 300 MG tablet Take 300 mg by mouth daily.     ALPRAZolam  (XANAX) 0.5 MG tablet Take 0.5 mg by mouth 2 (two) times daily as needed.     amLODipine (NORVASC) 10 MG tablet Take 1 tablet (10 mg total) by mouth daily. (Patient taking differently: Take 10 mg by mouth every evening.)     Aspirin-Acetaminophen-Caffeine (GOODYS EXTRA STRENGTH) 520-260-32.5 MG PACK Take 1 packet by mouth every 6 (six) hours as needed (pain).     atorvastatin (LIPITOR) 40 MG tablet Take 40 mg by mouth every evening.     carvedilol (COREG) 25 MG tablet Take 25 mg by mouth in the morning and at bedtime.     cholecalciferol (CHOLECALCIFEROL) 25 MCG tablet Take 1 tablet (1,000 Units total) by mouth daily.     docusate sodium (COLACE) 100 MG capsule Take 1 capsule (100 mg total) by mouth 2 (two) times daily.     doxazosin (CARDURA) 2 MG tablet Take 2 mg by mouth at bedtime.     DULoxetine (CYMBALTA) 60 MG capsule Take 60 mg by mouth daily.     furosemide (LASIX) 80 MG tablet Take 80 mg by mouth 2 (two) times daily.     glipiZIDE (GLUCOTROL) 10 MG tablet Take 10 mg by mouth in the morning and at bedtime.     losartan (COZAAR) 50 MG tablet Take 50 mg by mouth in the morning and at bedtime.     magnesium hydroxide (MILK OF MAGNESIA) 400 MG/5ML suspension Take 30-60 mLs by mouth daily as needed for moderate constipation.     meloxicam (MOBIC) 15 MG tablet Take 15 mg by mouth daily.     metFORMIN (GLUCOPHAGE) 500 MG tablet Take 500 mg by mouth 2 (two) times daily with a meal.     metolazone (ZAROXOLYN) 2.5 MG tablet Take 2.5 mg by mouth daily as needed (edema). At lunch.     MOUNJARO 10 MG/0.5ML Pen Inject 10 mg into the skin once a week.     naproxen sodium (ALEVE) 220 MG tablet Take 220 mg by mouth 2 (two) times daily as needed (pain).     nystatin-triamcinolone (MYCOLOG II) cream Apply 1 application topically daily as needed (itch).      omeprazole (PRILOSEC) 40 MG capsule Take 40 mg by mouth daily.     polyethylene glycol (MIRALAX / GLYCOLAX) 17 g packet Take 17 g by mouth 2 (two)  times daily. (Patient taking differently: Take 17 g by mouth daily as needed for moderate constipation.)     Potassium Chloride ER 20 MEQ TBCR TAKE 1 & 1/2 (ONE & ONE-HALF) TABLETS BY MOUTH TWICE DAILY Oral for 30 Days     pramipexole (MIRAPEX) 0.5 MG tablet Take 0.5 mg by mouth in the morning and at bedtime.     senna-docusate (SENOKOT-S) 8.6-50 MG tablet Take 1 tablet by mouth 2 (two) times daily. (Patient taking differently: Take 1 tablet by mouth daily  as needed for moderate constipation.)     tamsulosin (FLOMAX) 0.4 MG CAPS capsule Take 0.4 mg by mouth at bedtime.        Home: Home Living Family/patient expects to be discharged to:: Private residence Living Arrangements: Spouse/significant other Available Help at Discharge: Family, Available 24 hours/day Type of Home: House Home Access: Ramped entrance Home Layout: One level Bathroom Shower/Tub: Engineer, manufacturing systems: Standard Bathroom Accessibility: No Home Equipment: Agricultural consultant (2 wheels), BSC/3in1, Wheelchair - manual, Other (comment), Rollator (4 wheels) Additional Comments: pt has been limited to [facility] mobility since hospital admission in July and dc'd to August to SNF for 2 weeks of rehab. Since returning home pt has worked with [facility] therapy on transfers, has been able to stand for ~3 mins on some visits with HHPT. Patient naps in recliner but sleeps in regular flat bed, uses CPAP when sleeping. Sunday of fall pt was trying to stand alone while spouse out of home.   Functional History: Prior Function Prior Level of Function : Needs assist Physical Assist : Mobility (physical), ADLs (physical) Mobility Comments: since ~July pt has been using WC for mobility, propelling with bil UE or Bil LE. pt uses slide board and assist from spouse for bed<>WC transfers, was able to do it alone after SNF stay but requires increased assistance now. ADLs Comments: uses bari [facility] with side platform to transfer WC<>BSC for toileting, bed  baths with assist from spouse. pt able to feed self and perform oral care and shave.  Functional Status:  Mobility: Bed Mobility Overal bed mobility: Needs Assistance Bed Mobility: Rolling, Sidelying to Sit Rolling: Mod assist, +2 for safety/equipment, Used rails Sidelying to sit: Min assist General bed mobility comments: instructions on log rolling and and assistance with BLEs and light assist to elevate trunk Transfers Overall transfer level: Needs assistance Equipment used: Ambulation equipment used Transfers: Sit to/from Stand, Bed to chair/wheelchair/BSC Sit to Stand: Max assist, +2 physical assistance, +2 safety/equipment, From elevated surface, Via lift equipment Bed to/from chair/wheelchair/BSC transfer type:: Via Financial planner via Lift Equipment: [employer] transfer comment: sit/stand x2 from EOB to RW then utilized stedy to transfer to recliner. able to use bues and scoot back in recliner for better positioning. Ambulation/Gait General Gait Details: unable    ADL: ADL Overall ADL's : Needs assistance/impaired Eating/Feeding: Independent, Sitting, Bed level Grooming: Wash/dry hands, Wash/dry face, Oral care, Brushing hair, Set up, Sitting Grooming Details (indicate cue type and reason): on EOB Upper Body Bathing: Minimal assistance, Sitting Upper Body Bathing Details (indicate cue type and reason): on EOB Lower Body Bathing: Moderate assistance, Sitting/lateral leans Upper Body Dressing : Sitting, Minimal assistance Upper Body Dressing Details (indicate cue type and reason): gown change Lower Body Dressing: Set up, Sitting/lateral leans Lower Body Dressing Details (indicate cue type and reason): pt. able to turn each direction in bed and bring LE in sideways to don socks and maintain back precautions during Toilet Transfer: Total assistance, +2 for physical assistance Toilet Transfer Details (indicate cue type and reason): bed level Toileting- Clothing  Manipulation and Hygiene: Total assistance, +2 for physical assistance General ADL Comments: reinforced back precautions and heavy review of PLB. pt. able to return demo but required cues to implement when needed along with rest breaks.  Cognition: Cognition Overall Cognitive Status: Within Functional Limits for tasks assessed Orientation Level: Oriented X4 Cognition Arousal: Alert Behavior During Therapy: WFL for tasks assessed/performed Overall Cognitive Status: Within Functional Limits for tasks assessed General Comments: jovial,  motivated towards recovery   Blood pressure 132/64, pulse 60, temperature 98 F (36.7 C), temperature source Oral, resp. rate 17, height 5\' 11"  (1.803 m), weight (!) 143.1 kg, SpO2 91%. Physical Exam Vitals and nursing note reviewed.  Constitutional:      Appearance: He is obese.  HENT:     Head: Normocephalic.     Nose: Nose normal.     Mouth/Throat:     Mouth: Mucous membranes are moist.  Eyes:     Extraocular Movements: Extraocular movements intact.     Pupils: Pupils are equal, round, and reactive to light.  Cardiovascular:     Rate and Rhythm: Normal rate and regular rhythm.     Heart sounds: No murmur heard.    No gallop.  Pulmonary:     Effort: Pulmonary effort is normal. No respiratory distress.     Breath sounds: No wheezing.  Chest:     Chest wall: Tenderness (left lateral chest) present.  Abdominal:     General: Bowel sounds are normal. There is no distension.     Palpations: Abdomen is soft.     Tenderness: There is no abdominal tenderness.  Musculoskeletal:        General: Tenderness (back) present. Normal range of motion.     Cervical back: Normal range of motion.     Right lower leg: Edema (trace) present.     Left lower leg: Left lower leg edema: trace.  Skin:    General: Skin is warm and dry.     Comments: Back incision C/D/Intact. I removed honeycomb dressing which was falling off. Dry scab on left shin. Stasis changes  BLE.   Neurological:     Mental Status: He is alert.     Comments: Alert and oriented x 3. Normal insight and awareness. Intact Memory. Normal language and speech. Cranial nerve exam unremarkable. MMT: UE 5/5 bilaterally. RLE 3+ to 4-/5 HF, KE and 4/5 ADF/PF.  LLE 4/5 HF,KE, 4+ADF/PF. Pt senses pain and light touch equally on all 4's.  DTR's 3+ in both LE's. 3-4 beats of clonus each foot.  Psychiatric:        Mood and Affect: Mood normal.        Behavior: Behavior normal.        Thought Content: Thought content normal.     Results for orders placed or performed during the hospital encounter of 03/11/23 (from the past 48 hour(s))  Glucose, capillary     Status: None   Collection Time: 03/21/23  5:12 PM  Result Value Ref Range   Glucose-Capillary 87 70 - 99 mg/dL    Comment: Glucose reference range applies only to samples taken after fasting for at least 8 hours.  Glucose, capillary     Status: Abnormal   Collection Time: 03/21/23  7:48 PM  Result Value Ref Range   Glucose-Capillary 140 (H) 70 - 99 mg/dL    Comment: Glucose reference range applies only to samples taken after fasting for at least 8 hours.  Glucose, capillary     Status: Abnormal   Collection Time: 03/22/23  7:29 AM  Result Value Ref Range   Glucose-Capillary 100 (H) 70 - 99 mg/dL    Comment: Glucose reference range applies only to samples taken after fasting for at least 8 hours.  Glucose, capillary     Status: Abnormal   Collection Time: 03/22/23 11:50 AM  Result Value Ref Range   Glucose-Capillary 167 (H) 70 - 99 mg/dL    Comment:  Glucose reference range applies only to samples taken after fasting for at least 8 hours.  Glucose, capillary     Status: None   Collection Time: 03/22/23  4:09 PM  Result Value Ref Range   Glucose-Capillary 80 70 - 99 mg/dL    Comment: Glucose reference range applies only to samples taken after fasting for at least 8 hours.  Glucose, capillary     Status: Abnormal   Collection Time:  03/22/23  8:42 PM  Result Value Ref Range   Glucose-Capillary 132 (H) 70 - 99 mg/dL    Comment: Glucose reference range applies only to samples taken after fasting for at least 8 hours.  Glucose, capillary     Status: Abnormal   Collection Time: 03/23/23  8:02 AM  Result Value Ref Range   Glucose-Capillary 122 (H) 70 - 99 mg/dL    Comment: Glucose reference range applies only to samples taken after fasting for at least 8 hours.  Glucose, capillary     Status: Abnormal   Collection Time: 03/23/23 11:36 AM  Result Value Ref Range   Glucose-Capillary 101 (H) 70 - 99 mg/dL    Comment: Glucose reference range applies only to samples taken after fasting for at least 8 hours.   No results found.    Blood pressure 132/64, pulse 60, temperature 98 F (36.7 C), temperature source Oral, resp. rate 17, height 5\' 11"  (1.803 m), weight (!) 143.1 kg, SpO2 91%.  Medical Problem List and Plan: 1. 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 Pt s/p T10 laminectomy and discectomy on 03/12/23.   -patient Boys shower  -ELOS/Goals: 17-24 days, goals supervision to min assist at w/c level  -Bilateral PRAFO's 2.  Antithrombotics: -DVT/anticoagulation:  Pharmaceutical: Change heparin to Lovenox.  Will check surveillance dopplers due to immobility.   -antiplatelet therapy: N/A 3. Pain Management: Oxycodone prn--> has been using IV dilaudid couple of times a day  --He feels that dilaudid works better but oxycodone last longer. Will schedule oxycodone 10 mg TID for now   --Wean down as pain/activity improves.   4. Mood/Behavior/Sleep: LCSW to follow for evaluation and support.   -antipsychotic agents: N/A 5. Neuropsych/cognition: This patient is capable of making decisions on his own behalf. 6. Skin/Wound Care: Routine pressure relief measures.  7. Fluids/Electrolytes/Nutrition: Monitor I/O. Check CMET in am  9. HTN: Monitor BS TID--continue Amlodipine,  Coreg, Cardura and Lasix  bid  --labile with SBP ranging 130-160 during the day. Monitor for orthostatic symptoms.  10.  OSA: CPAP at nights-->Is compliant with his machine and advised him to ask his wife to bring it from home. 11. T2DM: Hgb A1C- 6.0 and well controlled.  --will monitor BS ac/hs and use SSI for elevated BS.  BS stable on glucotrol and metformin daily in am.  --monitor for changes with increase in activity.  10. COPD: Has intermittent cough " from 40 yrs of smoking"  --resume MDI. 11. Hypokalemia: Likely due to diuretic/intake. Will continue K dur  30 meq BID --K- 3.3 on 10/09 (admission)-->recheck BMET in am 12. CKD: Baseline SCr around 1.3--will recheck in am. Advised not to use Goodys or Mobic.  13. Leucocytosis: Monitor for fevers and other signs of infection. WBC was up to 13.3 after surgery.  --recheck WBC in am 14. Acute on chronic anemia: Recheck H/H in am 15. Constipation: Last BM documented on 10/18. -- On Senna 1 bid--> change to 2 in am. Sorbitol 45 cc tonight.  16.  Neurogenic bladder:  Continue Flomax and Cardura. Foley was placed at admission  --Continue foley today--remove in am.  17. Morbid obesity: BMI -44. Was 317 at admission and down to 315 lbs. He would like to lose more weight.  --Will need ongoing education on diet as well as importance of weight loss to promote mobility and wound healing 18.  Anxiety d/o: On Cymbalta with prn Xanax.  19.  Left chest wall/rib pain: Has been ongoing since 4 days and feels that is positional. Lidocaine patches at night as pain worse then.  --Appears MS in nature but will check CXR to rule out rib fracture/infection.       Jacquelynn Cree, PA-C 03/23/2023

## 2023-03-23 NOTE — Progress Notes (Signed)
Called the report for [facility].  Made their nurse aware about calling to  patient's wife regarding transferring and the room number.

## 2023-03-23 NOTE — Progress Notes (Signed)
Inpatient Rehab Admissions Coordinator:    I did receive insurance auth for CIR but do not have a bed for him today.  Megan Salon, MS, CCC-SLP Rehab Admissions Coordinator  [phone] (celll) [phone] (office)

## 2023-03-23 NOTE — H&P (Signed)
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 He ws found to have large HNP at T10/T11 and underwent T10 laminectomy with diskectomy by Dr. Franky Macho on 09/10. Post op he had issues with poor pain control and has been using IV dilaudid with oxycodone prn. Foley placed in ED and remains in place. Po intake has been good but has not had BM for 3 days and usually goes daily. Has been refusing CPAP but compliant at home.  Sensation bilateral feet and ankle are improving. PT/OT has been working with patient who requires Mod assist with Stedy to stand and min assist with ADLs. He was independent and working prior to this summer.  CIR recommended due to functional decline.      Review of Systems  HENT:  Negative for hearing loss.   Eyes:  Negative for blurred vision and double vision.  Respiratory:  Positive for cough. Negative for shortness of breath.   Cardiovascular:  Negative for chest pain and leg swelling.  Gastrointestinal:  Positive for constipation. Negative for abdominal pain and heartburn.  Genitourinary:  Negative for dysuria.       Used to get up 4-5 times at night for years  Musculoskeletal:  Positive for back pain.  Neurological:  Positive for sensory change (bilateral thighs still numb) and weakness. Negative for dizziness and headaches.  Psychiatric/Behavioral:  Negative for hallucinations. The patient has insomnia.             Past Medical History:  Diagnosis Date   Anxiety     COPD (chronic obstructive pulmonary disease) (HCC)     Diabetes mellitus without complication (HCC)     Edema     GERD (gastroesophageal reflux disease)     Gout     Heavy smoker      Hyperlipidemia     Hypertension     Sleep apnea      uses a cpap               Past Surgical History:  Procedure Laterality Date   CHONDROPLASTY Left 06/28/2014    Procedure: CHONDROPLASTY;  Surgeon: Thera Flake., Raymond [name];  Location: [facility];  Service: Orthopedics;  Laterality: Left;   COLONOSCOPY       FOOT FASCIOTOMY        age 68-rt   KNEE ARTHROSCOPY WITH LATERAL MENISECTOMY Left 06/28/2014    Procedure: KNEE ARTHROSCOPY WITH LATERAL MENISECTOMY;  Surgeon: Thera Flake., Raymond [name];  Location: [facility];  Service: Orthopedics;  Laterality: Left;   KNEE ARTHROSCOPY WITH MEDIAL MENISECTOMY Left 06/28/2014    Procedure: LEFT KNEE ARTHROSCOPY CHONDROPLASTY/WITH MEDIAL/LATERAL MENISECTOMIES;  Surgeon: Thera Flake., Raymond [name];  Location: [facility];  Service: Orthopedics;  Laterality: Left;   ORIF RADIUS & ULNA FRACTURES   2007    left   THORACIC DISCECTOMY N/A 03/12/2023    Procedure: THORACIC LAMINECTOMY AND  DISCECTOMY;  Surgeon: Coletta Memos, Raymond [name];  Location: [facility] OR;  Service: Neurosurgery;  Laterality: N/A;               Family History  Problem Relation Age  of Onset   Diabetes Mother     Hypertension Mother     Diabetes Father     Hypertension Father     Cancer Other            Social History:  Married. 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 He reports that he has been smoking cigarettes--2 PPD. He has never used smokeless tobacco. He reports current alcohol use--wine on the weekends. He reports that he does not use drugs.     Allergies      Allergies  Allergen Reactions   Sulfa Antibiotics Hives              Medications Prior to Admission  Medication Sig Dispense Refill   acetaminophen (TYLENOL) 500 MG tablet Take 1,000 mg by mouth every 6 (six) hours as needed for moderate pain.       albuterol (VENTOLIN HFA) 108 (90 Base) MCG/ACT inhaler Inhale 2 puffs into the lungs every 6 (six) hours as needed for wheezing or  shortness of breath.       allopurinol (ZYLOPRIM) 300 MG tablet Take 300 mg by mouth daily.       ALPRAZolam (XANAX) 0.5 MG tablet Take 0.5 mg by mouth 2 (two) times daily as needed.       amLODipine (NORVASC) 10 MG tablet Take 1 tablet (10 mg total) by mouth daily. (Patient taking differently: Take 10 mg by mouth every evening.)       Aspirin-Acetaminophen-Caffeine (GOODYS EXTRA STRENGTH) 520-260-32.5 MG PACK Take 1 packet by mouth every 6 (six) hours as needed (pain).       atorvastatin (LIPITOR) 40 MG tablet Take 40 mg by mouth every evening.       carvedilol (COREG) 25 MG tablet Take 25 mg by mouth in the morning and at bedtime.       cholecalciferol (CHOLECALCIFEROL) 25 MCG tablet Take 1 tablet (1,000 Units total) by mouth daily.       docusate sodium (COLACE) 100 MG capsule Take 1 capsule (100 mg total) by mouth 2 (two) times daily.       doxazosin (CARDURA) 2 MG tablet Take 2 mg by mouth at bedtime.       DULoxetine (CYMBALTA) 60 MG capsule Take 60 mg by mouth daily.       furosemide (LASIX) 80 MG tablet Take 80 mg by mouth 2 (two) times daily.       glipiZIDE (GLUCOTROL) 10 MG tablet Take 10 mg by mouth in the morning and at bedtime.       losartan (COZAAR) 50 MG tablet Take 50 mg by mouth in the morning and at bedtime.       magnesium hydroxide (MILK OF MAGNESIA) 400 MG/5ML suspension Take 30-60 mLs by mouth daily as needed for moderate constipation.       meloxicam (MOBIC) 15 MG tablet Take 15 mg by mouth daily.       metFORMIN (GLUCOPHAGE) 500 MG tablet Take 500 mg by mouth 2 (two) times daily with a meal.       metolazone (ZAROXOLYN) 2.5 MG tablet Take 2.5 mg by mouth daily as needed (edema). At lunch.       MOUNJARO 10 MG/0.5ML Pen Inject 10 mg into the skin once a week.       naproxen sodium (ALEVE) 220 MG tablet Take 220 mg by mouth 2 (two) times daily as needed (pain).       nystatin-triamcinolone (MYCOLOG II) cream Apply 1 application topically daily  as needed (itch).         omeprazole (PRILOSEC) 40 MG capsule Take 40 mg by mouth daily.       polyethylene glycol (MIRALAX / GLYCOLAX) 17 g packet Take 17 g by mouth 2 (two) times daily. (Patient taking differently: Take 17 g by mouth daily as needed for moderate constipation.)       Potassium Chloride ER 20 MEQ TBCR TAKE 1 & 1/2 (ONE & ONE-HALF) TABLETS BY MOUTH TWICE DAILY Oral for 30 Days       pramipexole (MIRAPEX) 0.5 MG tablet Take 0.5 mg by mouth in the morning and at bedtime.       senna-docusate (SENOKOT-S) 8.6-50 MG tablet Take 1 tablet by mouth 2 (two) times daily. (Patient taking differently: Take 1 tablet by mouth daily as needed for moderate constipation.)       tamsulosin (FLOMAX) 0.4 MG CAPS capsule Take 0.4 mg by mouth at bedtime.                  Home: Home Living Family/patient expects to be discharged to:: Private residence Living Arrangements: Spouse/significant other Available Help at Discharge: Family, Available 24 hours/day Type of Home: House Home Access: Ramped entrance Home Layout: One level Bathroom [name]/Tub: Engineer, manufacturing systems: Standard Bathroom Accessibility: No Home Equipment: Agricultural consultant (2 wheels), BSC/3in1, Wheelchair - manual, Other (comment), Rollator (4 wheels) Additional Comments: pt has been limited to [facility] mobility since hospital admission in July and dc'd to August to SNF for 2 weeks of rehab. Since returning home pt has worked with [facility] therapy on transfers, has been able to stand for ~3 mins on some visits with HHPT. Patient naps in recliner but sleeps in regular flat bed, uses CPAP when sleeping. Sunday of fall pt was trying to stand alone while spouse out of home.   Functional History: Prior Function Prior Level of Function : Needs assist Physical Assist : Mobility (physical), ADLs (physical) Mobility Comments: since ~July pt has been using WC for mobility, propelling with bil UE or Bil LE. pt uses slide board and assist from spouse for bed<>WC  transfers, was able to do it alone after SNF stay but requires increased assistance now. ADLs Comments: uses bari [facility] with side platform to transfer WC<>BSC for toileting, bed baths with assist from spouse. pt able to feed self and perform oral care and shave.   Functional Status:  Mobility: Bed Mobility Overal bed mobility: Needs Assistance Bed Mobility: Rolling, Sidelying to Sit Rolling: Mod assist, +2 for safety/equipment, Used rails Sidelying to sit: Min assist General bed mobility comments: instructions on log rolling and and assistance with BLEs and light assist to elevate trunk Transfers Overall transfer level: Needs assistance Equipment used: Ambulation equipment used Transfers: Sit to/from Stand, Bed to chair/wheelchair/BSC Sit to Stand: Max assist, +2 physical assistance, +2 safety/equipment, From elevated surface, Via lift equipment Bed to/from chair/wheelchair/BSC transfer type:: Via Financial planner via Lift Equipment: [employer] transfer comment: sit/stand x2 from EOB to RW then utilized stedy to transfer to recliner. able to use bues and scoot back in recliner for better positioning. Ambulation/Gait General Gait Details: unable   ADL: ADL Overall ADL's : Needs assistance/impaired Eating/Feeding: Independent, Sitting, Bed level Grooming: Wash/dry hands, Wash/dry face, Oral care, Brushing hair, Set up, Sitting Grooming Details (indicate cue type and reason): on EOB Upper Body Bathing: Minimal assistance, Sitting Upper Body Bathing Details (indicate cue type and reason): on EOB Lower Body Bathing: Moderate assistance, Sitting/lateral leans  Upper Body Dressing : Sitting, Minimal assistance Upper Body Dressing Details (indicate cue type and reason): gown change Lower Body Dressing: Set up, Sitting/lateral leans Lower Body Dressing Details (indicate cue type and reason): pt. able to turn each direction in bed and bring LE in sideways to don socks and maintain back  precautions during Toilet Transfer: Total assistance, +2 for physical assistance Toilet Transfer Details (indicate cue type and reason): bed level Toileting- Clothing Manipulation and Hygiene: Total assistance, +2 for physical assistance General ADL Comments: reinforced back precautions and heavy review of PLB. pt. able to return demo but required cues to implement when needed along with rest breaks.   Cognition: Cognition Overall Cognitive Status: Within Functional Limits for tasks assessed Orientation Level: Oriented X4 Cognition Arousal: Alert Behavior During Therapy: WFL for tasks assessed/performed Overall Cognitive Status: Within Functional Limits for tasks assessed General Comments: jovial, motivated towards recovery     Blood pressure 132/64, pulse 60, temperature 98 F (36.7 C), temperature source Oral, resp. rate 17, height 5\' 11"  (1.803 m), weight (!) 143.1 kg, SpO2 91%. Physical Exam Vitals and nursing note reviewed.  Constitutional:      Appearance: He is obese.  HENT:     Head: Normocephalic.     Nose: Nose normal.     Mouth/Throat:     Mouth: Mucous membranes are moist.  Eyes:     Extraocular Movements: Extraocular movements intact.     Pupils: Pupils are equal, round, and reactive to light.  Cardiovascular:     Rate and Rhythm: Normal rate and regular rhythm.     Heart sounds: No murmur heard.    No gallop.  Pulmonary:     Effort: Pulmonary effort is normal. No respiratory distress.     Breath sounds: No wheezing.  Chest:     Chest wall: Tenderness (left lateral chest) present.  Abdominal:     General: Bowel sounds are normal. There is no distension.     Palpations: Abdomen is soft.     Tenderness: There is no abdominal tenderness.  Musculoskeletal:        General: Tenderness (back) present. Normal range of motion.     Cervical back: Normal range of motion.     Right lower leg: Edema (trace) present.     Left lower leg: Left lower leg edema: trace.   Skin:    General: Skin is warm and dry.     Comments: Back incision C/D/Intact. I removed honeycomb dressing which was falling off. Dry scab on left shin. Stasis changes BLE.   Neurological:     Mental Status: He is alert.     Comments: Alert and oriented x 3. Normal insight and awareness. Intact Memory. Normal language and speech. Cranial nerve exam unremarkable. MMT: UE 5/5 bilaterally. RLE 3+ to 4-/5 HF, KE and 4/5 ADF/PF.  LLE 4/5 HF,KE, 4+ADF/PF. Pt senses pain and light touch equally on all 4's.  DTR's 3+ in both LE's. 3-4 beats of clonus each foot. Mild extensor tone in LE's Psychiatric:        Mood and Affect: Mood normal.        Behavior: Behavior normal.        Thought Content: Thought content normal.        Lab Results Last 48 Hours        Results for orders placed or performed during the hospital encounter of 03/11/23 (from the past 48 hour(s))  Glucose, capillary     Status: None  Collection Time: 03/21/23  5:12 PM  Result Value Ref Range    Glucose-Capillary 87 70 - 99 mg/dL      Comment: Glucose reference range applies only to samples taken after fasting for at least 8 hours.  Glucose, capillary     Status: Abnormal    Collection Time: 03/21/23  7:48 PM  Result Value Ref Range    Glucose-Capillary 140 (H) 70 - 99 mg/dL      Comment: Glucose reference range applies only to samples taken after fasting for at least 8 hours.  Glucose, capillary     Status: Abnormal    Collection Time: 03/22/23  7:29 AM  Result Value Ref Range    Glucose-Capillary 100 (H) 70 - 99 mg/dL      Comment: Glucose reference range applies only to samples taken after fasting for at least 8 hours.  Glucose, capillary     Status: Abnormal    Collection Time: 03/22/23 11:50 AM  Result Value Ref Range    Glucose-Capillary 167 (H) 70 - 99 mg/dL      Comment: Glucose reference range applies only to samples taken after fasting for at least 8 hours.  Glucose, capillary     Status: None    Collection  Time: 03/22/23  4:09 PM  Result Value Ref Range    Glucose-Capillary 80 70 - 99 mg/dL      Comment: Glucose reference range applies only to samples taken after fasting for at least 8 hours.  Glucose, capillary     Status: Abnormal    Collection Time: 03/22/23  8:42 PM  Result Value Ref Range    Glucose-Capillary 132 (H) 70 - 99 mg/dL      Comment: Glucose reference range applies only to samples taken after fasting for at least 8 hours.  Glucose, capillary     Status: Abnormal    Collection Time: 03/23/23  8:02 AM  Result Value Ref Range    Glucose-Capillary 122 (H) 70 - 99 mg/dL      Comment: Glucose reference range applies only to samples taken after fasting for at least 8 hours.  Glucose, capillary     Status: Abnormal    Collection Time: 03/23/23 11:36 AM  Result Value Ref Range    Glucose-Capillary 101 (H) 70 - 99 mg/dL      Comment: Glucose reference range applies only to samples taken after fasting for at least 8 hours.      Imaging Results (Last 48 hours)  No results found.         Blood pressure 132/64, pulse 60, temperature 98 F (36.7 C), temperature source Oral, resp. rate 17, height 5\' 11"  (1.803 m), weight (!) 143.1 kg, SpO2 91%.   Medical Problem List and Plan: 1. 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 Pt s/p T10 laminectomy and discectomy on 03/12/23.              -patient Raymond [name]             -ELOS/Goals: 17-24 days, goals supervision to min assist at w/c level             -Bilateral PRAFO's 2.  Antithrombotics: -DVT/anticoagulation:  Pharmaceutical: Change heparin to Lovenox.  Will check surveillance dopplers due to immobility.              -antiplatelet therapy: N/A 3. Pain Management: Oxycodone prn--> has been using IV dilaudid couple of times a day             --  He feels that dilaudid works better but oxycodone last longer. Will schedule oxycodone 10 mg TID for now              --Wean down as pain/activity improves.    4.  Mood/Behavior/Sleep: LCSW to follow for evaluation and support.              -antipsychotic agents: N/A 5. Neuropsych/cognition: This patient is capable of making decisions on his own behalf. 6. Skin/Wound Care: back incision is CDI. Cherubini be left open to air  7. Fluids/Electrolytes/Nutrition: Monitor I/O. Check CMET in am  8. Spasticity: slightly improved with addition of low dose baclofen last week. Increase baclofen to 5mg  TID for now 9. HTN: Monitor BS TID--continue Amlodipine,  Coreg, Cardura and Lasix bid             --labile with SBP ranging 130-160 during the day. Monitor for orthostatic symptoms.  10.  OSA: CPAP at nights-->Is compliant with his machine and advised him to ask his wife to bring it from home. 11. T2DM: Hgb A1C- 6.0 and well controlled.  --will monitor BS ac/hs and use SSI for elevated BS.  BS stable on glucotrol and metformin daily in am.  --monitor for changes with increase in activity.  10. COPD: Has intermittent cough " from 40 yrs of smoking"             --resume MDI. 11. Hypokalemia: Likely due to diuretic/intake. Will continue K dur  30 meq BID --K- 3.3 on 10/09 (admission)-->recheck BMET in am 12. CKD: Baseline SCr around 1.3--will recheck in am. Advised not to use Goodys or Mobic.  13. Leucocytosis: Monitor for fevers and other signs of infection. WBC was up to 13.3 after surgery.             --recheck WBC in am 14. Acute on chronic anemia: Recheck H/H in am 15. Constipation/neurogenic bowel.  Last BM documented on 10/18. -- On Senna 1 bid--> change to 2 in am. Sorbitol 45 cc tonight. -dulcolax supp in pm -probably doesn't need a bowel program 16.  Neurogenic bladder: Continue Flomax and Cardura. Foley was placed at admission             --Continue foley tonight.  -Voiding trial in AM 17. Morbid obesity: BMI -44. Was 317 at admission and down to 315 lbs. He would like to lose more weight.  --Will need ongoing education on diet as well as importance of weight  loss to promote mobility and wound healing 18.  Anxiety d/o: On Cymbalta with prn Xanax.  19.  Left chest wall/rib pain: Has been ongoing since 4 days and feels that is positional. Lidocaine patches at night as pain worse then.  --Appears MS in nature but will check CXR to rule out rib fracture/infection.     -        Raymond Cree, Raymond [name] 03/23/2023  I have personally performed a face to face diagnostic evaluation of this patient and formulated the key components of the plan.  Additionally, I have personally reviewed laboratory data, imaging studies, as well as relevant notes and concur with the physician assistant's documentation above.  The patient's status has not changed from the original H&P.  Any changes in documentation from the acute care chart have been noted above.  Raymond Oyster, Raymond [name], [place] Dom

## 2023-03-23 NOTE — Progress Notes (Signed)
PMR Admission Coordinator Pre-Admission Assessment   Patient: Raymond [name] is an 57 y.o., male MRN: [id number] DOB: [birth year] Height: 5\' 11"  (180.3 cm) Weight: (!) 145.9 kg                                                                                                                                                  Insurance Information HMO:     PPO: Yes     PCP:       IPA:       80/20:       OTHER:  Group [id number] PRIMARY: [employer] Managed      Policy#: [id number]      Subscriber: patient CM Name: Sofie      Phone#:      Fax#: [phone] Pre-Cert#: [id number] approved by Sofie for 7 days 03/20/23 to 03/25/24 with update due to Shelah Lewandowsky on 03/26/23 at fax [phone]     Employer: [employer] Benefits:  Phone #: [phone]     Name: Verified 03/19/23 Eff. Date: 11/01/22 to 06/02/23    Deduct: $3500 (met $2243.35)       Out of Pocket Max: $7000 (met [phone])      Life Max: n/a  CIR: 80%      SNF: 80% Outpatient: 80%     Co-Pay: 20% Home Health: 80%      Co-Pay: 20% DME: 80%     Co-Pay: 20% Providers: in network  SECONDARY:       Policy#:       Phone#:    Artist:       Phone#:    The Data processing manager" for patients in Inpatient Rehabilitation Facilities with attached "Privacy Act Statement-Health Care Records" was provided and verbally reviewed with: Patient   Emergency Contact Information Contact Information       Name Relation Home Work Mobile    Peoples,[name] Spouse [phone]             Other Contacts   None on File      Current Medical History  Patient 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 male with a history of chronic low back pain, COPD and diabetes who apparently fell at home on Sunday, 03/08/2023.  He presented to the [facility] ED on 03/11/23 and then was transferred to [facility] on 03/12/23 for further workup.  Patient developed increasing  back pain as well as lower extremity weakness and urinary incontinence.  RI revealed a large HNP at T10/T11.  Patient was seen by neurosurgery and on 03/12/2023 a T10 laminectomy and discectomy was performed by Dr. Franky Macho.  Since the surgery patient does report some improvement in his back pain but continues to demonstrate lower extremity weakness and spasticity.  He has a Foley catheter  in place.  He tells me that he had a bowel movement yesterday and was continent of it.  Patient was up with therapy yesterday and was max assist +2 for sit to stand transfers.  He did not ambulate.  Therapy notes spasticity as an issue.  Patient has been limited to wheelchair mobility since hospitalization in July.  Apparently he went to skilled nursing for therapy after this admission.  Since coming home he was standing for short periods of time with home health therapies, but otherwise was still using a wheelchair for mobility and self-care.  He lives at home with his spouse and has a ramp entry into home.   Glasgow Coma Scale Score: 15   Patient's medical record from [facility] has been reviewed by the rehabilitation admission coordinator and physician.   Past Medical History      Past Medical History:  Diagnosis Date   Anxiety     COPD (chronic obstructive pulmonary disease) (HCC)     Diabetes mellitus without complication (HCC)     Edema     GERD (gastroesophageal reflux disease)     Gout     Heavy smoker     Hyperlipidemia     Hypertension     Sleep apnea      uses a cpap          Has the patient had major surgery during 100 days prior to admission? Yes   Family History  family history includes Cancer in an other family member; Diabetes in his father and mother; Hypertension in his father and mother.     Current Medications   Current Medications    Current Facility-Administered Medications:    acetaminophen (TYLENOL) tablet 650 mg, 650 mg, Oral, Q6H PRN, 650 mg at 03/19/23 1129  **OR** acetaminophen (TYLENOL) suppository 650 mg, 650 mg, Rectal, Q6H PRN, Coletta Memos, MD   albuterol (PROVENTIL) (2.5 MG/3ML) 0.083% nebulizer solution 2.5 mg, 2.5 mg, Nebulization, Q2H PRN, Coletta Memos, MD   allopurinol (ZYLOPRIM) tablet 300 mg, 300 mg, Oral, Daily, Cabbell, Kyle, MD, 300 mg at 03/19/23 6578   ALPRAZolam Prudy Feeler) tablet 0.5 mg, 0.5 mg, Oral, QHS PRN, Patrici Ranks Caylin, PA-C, 0.5 mg at 03/18/23 2121   amLODipine (NORVASC) tablet 10 mg, 10 mg, Oral, Daily, Coletta Memos, MD, 10 mg at 03/19/23 0837   atorvastatin (LIPITOR) tablet 40 mg, 40 mg, Oral, Daily, Coletta Memos, MD, 40 mg at 03/19/23 0837   baclofen (LIORESAL) tablet 5 mg, 5 mg, Oral, BID, Faith Rogue T, MD, 5 mg at 03/19/23 1120   carvedilol (COREG) tablet 25 mg, 25 mg, Oral, BID WC, Coletta Memos, MD, 25 mg at 03/19/23 4696   Chlorhexidine Gluconate Cloth 2 % PADS 6 each, 6 each, Topical, Daily, Coletta Memos, MD, 6 each at 03/19/23 1120   cyclobenzaprine (FLEXERIL) tablet 10 mg, 10 mg, Oral, TID PRN, Patrici Ranks Caylin, PA-C, 10 mg at 03/19/23 2952   doxazosin (CARDURA) tablet 2 mg, 2 mg, Oral, QHS, Cabbell, Ronaldo Miyamoto, MD, 2 mg at 03/18/23 2215   DULoxetine (CYMBALTA) DR capsule 60 mg, 60 mg, Oral, Daily, Coletta Memos, MD, 60 mg at 03/19/23 0837   furosemide (LASIX) tablet 80 mg, 80 mg, Oral, BID, Coletta Memos, MD, 80 mg at 03/19/23 0838   glipiZIDE (GLUCOTROL) tablet 10 mg, 10 mg, Oral, QAC breakfast, Coletta Memos, MD, 10 mg at 03/19/23 0838   heparin injection 5,000 Units, 5,000 Units, Subcutaneous, Q8H, Coletta Memos, MD, 5,000 Units at 03/19/23 [phone]  HYDROmorphone (DILAUDID) injection 0.5-1 mg, 0.5-1 mg, Intravenous, Q2H PRN, Coletta Memos, MD, 1 mg at 03/19/23 0855   insulin aspart (novoLOG) injection 0-5 Units, 0-5 Units, Subcutaneous, QHS, Cabbell, Kyle, MD   insulin aspart (novoLOG) injection 0-9 Units, 0-9 Units, Subcutaneous, TID WC, Coletta Memos, MD, 1 Units at 03/19/23 1137   losartan  (COZAAR) tablet 50 mg, 50 mg, Oral, Daily, Coletta Memos, MD, 50 mg at 03/19/23 0960   magnesium citrate solution 1 Bottle, 1 Bottle, Oral, Once PRN, Coletta Memos, MD   metFORMIN (GLUCOPHAGE-XR) 24 hr tablet 1,000 mg, 1,000 mg, Oral, Q breakfast, Coletta Memos, MD, 1,000 mg at 03/19/23 0837   ondansetron (ZOFRAN) tablet 4 mg, 4 mg, Oral, Q6H PRN **OR** ondansetron (ZOFRAN) injection 4 mg, 4 mg, Intravenous, Q6H PRN, Coletta Memos, MD   oxyCODONE (Oxy IR/ROXICODONE) immediate release tablet 5 mg, 5 mg, Oral, Q4H PRN, Coletta Memos, MD, 5 mg at 03/18/23 1648   pantoprazole (PROTONIX) EC tablet 80 mg, 80 mg, Oral, Daily, Cabbell, Ronaldo Miyamoto, MD, 80 mg at 03/19/23 0837   potassium chloride (KLOR-CON M) CR tablet 30 mEq, 30 mEq, Oral, BID, Coletta Memos, MD, 30 mEq at 03/19/23 4540   pramipexole (MIRAPEX) tablet 0.5 mg, 0.5 mg, Oral, BID, Coletta Memos, MD, 0.5 mg at 03/19/23 9811   senna-docusate (Senokot-S) tablet 1 tablet, 1 tablet, Oral, BID, Coletta Memos, MD, 1 tablet at 03/19/23 0837   tamsulosin (FLOMAX) capsule 0.4 mg, 0.4 mg, Oral, QHS, Coletta Memos, MD, 0.4 mg at 03/18/23 2120     Patients Current Diet:  Diet Order                  Diet Carb Modified Fluid consistency: Thin; Room service appropriate? Yes with Assist  Diet effective now                         Precautions / Restrictions Precautions Precautions: Fall Restrictions Weight Bearing Restrictions: No    Has the patient had 2 or more falls or a fall with injury in the past year?Yes   Prior Activity Level Limited Community (1-2x/wk): Went out 2-3 X a week.   Prior Functional Level Prior Function Prior Level of Function : Needs assist Physical Assist : Mobility (physical), ADLs (physical) Mobility Comments: since ~July pt has been using WC for mobility, propelling with bil UE or Bil LE. pt uses slide board and assist from spouse for bed<>WC transfers, was able to do it alone after SNF stay but requires increased  assistance now. ADLs Comments: uses bari [facility] with side platform to transfer WC<>BSC for toileting, bed baths with assist from spouse. pt able to feed self and perform oral care and shave.   Self Care: Did the patient need help bathing, dressing, using the toilet or eating?  Needed some help   Indoor Mobility: Did the patient need assistance with walking from room to room (with or without device)? Needed some help   Stairs: Did the patient need assistance with internal or external stairs (with or without device)? Needed some help   Functional Cognition: Did the patient need help planning regular tasks such as shopping or remembering to take medications? Needed some help   Patient Information Are you of Hispanic, Latino/a,or Spanish origin?: A. No, not of Hispanic, Latino/a, or Spanish origin What is your race?: A. White Do you need or want an interpreter to communicate with a doctor or health care staff?: 0. No   Patient's Response To:  Health Literacy and Transportation Is the patient able to respond to health literacy and transportation needs?: Yes Health Literacy - How often do you need to have someone help you when you read instructions, pamphlets, or other written material from your doctor or pharmacy?: Always In the past 12 months, has lack of transportation kept you from medical appointments or from getting medications?: No In the past 12 months, has lack of transportation kept you from meetings, work, or from getting things needed for daily living?: No   Home Assistive Devices / Equipment Home Equipment: Agricultural consultant (2 wheels), BSC/3in1, Wheelchair - manual, Other (comment), Rollator (4 wheels)   Prior Device Use: Indicate devices/aids used by the patient prior to current illness, exacerbation or injury? Manual wheelchair and Scooter when in the grocery store   Current Functional Level Cognition   Overall Cognitive Status: Within Functional Limits for tasks  assessed Orientation Level: Oriented X4 General Comments: jovial, motivated towards recovery    Extremity Assessment (includes Sensation/Coordination)   Upper Extremity Assessment: Overall WFL for tasks assessed  Lower Extremity Assessment: RLE deficits/detail, LLE deficits/detail RLE Deficits / Details: pt with hypertonicity in hamstrngs and gastroc complex, strong babinski (+) and clonus Rt>Lt. strength testing limited by tone, grossly 2/5 or less. Rt foot resting in significant plantarflexion and inversion with hypextension of great toe. RLE Sensation: decreased light touch, decreased proprioception RLE Coordination: decreased gross motor, decreased fine motor LLE Deficits / Details: pt with hypertonicity in hamstrings and gastroc complex, strong babinski (+) and clonus Rt>Lt. strength testing limited by tone, grossly 2/5 or less. Lt foot resting in significant plantarflexion and inversion with hypextension of great toe. LLE Sensation: decreased light touch, decreased proprioception LLE Coordination: decreased gross motor, decreased fine motor     ADLs   Overall ADL's : Needs assistance/impaired Eating/Feeding: Independent, Sitting, Bed level Grooming: Wash/dry hands, Wash/dry face, Oral care, Brushing hair, Set up, Sitting Grooming Details (indicate cue type and reason): on EOB Upper Body Bathing: Minimal assistance, Sitting Upper Body Bathing Details (indicate cue type and reason): on EOB Lower Body Bathing: Moderate assistance, Sitting/lateral leans Upper Body Dressing : Sitting, Minimal assistance Upper Body Dressing Details (indicate cue type and reason): gown change Lower Body Dressing: Moderate assistance, Sitting/lateral leans Lower Body Dressing Details (indicate cue type and reason): to donn shoes Toilet Transfer: Total assistance, +2 for physical assistance Toilet Transfer Details (indicate cue type and reason): bed level Toileting- Clothing Manipulation and Hygiene: Total  assistance, +2 for physical assistance General ADL Comments: Reinforeced POB precautions, needs handout     Mobility   Overal bed mobility: Needs Assistance Bed Mobility: Rolling, Sidelying to Sit Rolling: Mod assist, +2 for safety/equipment, Used rails Sidelying to sit: +2 for safety/equipment, Mod assist, Max assist General bed mobility comments: instructions on log rolling and and assistance with BLEs and trunk     Transfers   Overall transfer level: Needs assistance Equipment used: Ambulation equipment used Transfers: Sit to/from Stand, Bed to chair/wheelchair/BSC Sit to Stand: Max assist, +2 physical assistance, +2 safety/equipment, From elevated surface, Via lift equipment Bed to/from chair/wheelchair/BSC transfer type:: Via Financial planner via Lift Equipment: Stedy General transfer comment: sit to stands performed from EOB into stedy x2 and transferrred to recliner. 2 more sit to stands performed to straighten pad with limited standing tolerance and increased assistance     Ambulation / Gait / Stairs / Wheelchair Mobility         Posture / Balance Dynamic Sitting Balance Sitting balance -  Comments: able to perform self care tasks seated on EOB Balance Overall balance assessment: Needs assistance Sitting-balance support: Feet supported, Bilateral upper extremity supported Sitting balance-Leahy Scale: Fair Sitting balance - Comments: able to perform self care tasks seated on EOB Standing balance support: Bilateral upper extremity supported, During functional activity, Reliant on assistive device for balance Standing balance-Leahy Scale: Poor Standing balance comment: reliant on stedy for support. performed 4 total stands into stedy with deminishing standing tolerance     Special needs/care consideration Skin Post op back incision and moisture irritation in abdominal folds, Diabetic management Yes DM on oral medications and receiving insulin in acute hospital, and Special  service needs n/a        Previous Home Environment (from acute therapy documentation) Living Arrangements: Spouse/significant other Available Help at Discharge: Family, Available 24 hours/day Type of Home: House Home Layout: One level Home Access: Ramped entrance Bathroom Shower/Tub: Engineer, manufacturing systems: Standard Bathroom Accessibility: No Home Care Services: Yes Type of Home Care Services: Home PT Additional Comments: pt has been limited to [facility] mobility since hospital admission in July and dc'd to August to SNF for 2 weeks of rehab. Since returning home pt has worked with [facility] therapy on transfers, has been able to stand for ~3 mins on some visits with HHPT. Patient naps in recliner but sleeps in regular flat bed, uses CPAP when sleeping. Sunday of fall pt was trying to stand alone while spouse out of home.   Discharge Living Setting Plans for Discharge Living Setting: Patient's home, House, Lives with (comment) (Lives with wife.) Type of Home at Discharge: House Discharge Home Layout: One level Discharge Home Access: Ramped entrance (Ramp at the front entry) Discharge Bathroom Shower/Tub: Tub/shower unit, Curtain Discharge Bathroom Toilet: Standard Discharge Bathroom Accessibility: No Does the patient have any problems obtaining your medications?: No   Social/Family/Support Systems Patient Roles: Spouse, Other (Comment) (Has wife.  Step Dtr lives next door.) Contact Information: Tiney Rouge - wife - [phone] Anticipated Caregiver: wife Ability/Limitations of Caregiver: Wife is not working and can assist. Medical laboratory scientific officer: 24/7 Discharge Plan Discussed with Primary Caregiver: Yes Is Caregiver In Agreement with Plan?: Yes Does Caregiver/Family have Issues with Lodging/Transportation while Pt is in Rehab?: No     Goals Patient/Family Goal for Rehab: PT/OT supervision to min assist wheel chair level Expected length of stay: 17-24 days Pt/Family Agrees to  Admission and willing to participate: Yes Program Orientation Provided & Reviewed with Pt/Caregiver Including Roles  & Responsibilities: Yes     Decrease burden of Care through IP rehab admission: N/A     Possible need for SNF placement upon discharge:  Not anticipated     Patient Condition: This patient's medical and functional status has changed since the consult dated: 03/19/23 in which the Rehabilitation Physician determined and documented that the patient's condition is appropriate for intensive rehabilitative care in an inpatient rehabilitation facility. See "History of Present Illness" (above) for medical update. Functional changes are: Pt. Max A+2 with transfers. Patient's medical and functional status update has been discussed with the Rehabilitation physician and patient remains appropriate for inpatient rehabilitation. Will admit to inpatient rehab today.   Preadmission Screen Completed By:  Trish Mage, RN, 03/19/2023 2:39 PM ______________________________________________________________________   Discussed status with Dr. Riley Kill on 03/23/23 at 930 and received approval for admission today.   Admission Coordinator:  Trish Mage, time 130 Dorna Bloom 03/23/23        Cosigned by: Ranelle Oyster, MD at 03/23/2023  1:21 PM

## 2023-03-24 ENCOUNTER — Encounter (HOSPITAL_COMMUNITY): Payer: Self-pay | Admitting: Physical Medicine and Rehabilitation

## 2023-03-24 DIAGNOSIS — M4714 Other spondylosis with myelopathy, thoracic region: Secondary | ICD-10-CM | POA: Diagnosis not present

## 2023-03-24 LAB — COMPREHENSIVE METABOLIC PANEL
ALT: 21 U/L (ref 0–44)
AST: 16 U/L (ref 15–41)
Albumin: 2.6 g/dL — ABNORMAL LOW (ref 3.5–5.0)
Alkaline Phosphatase: 79 U/L (ref 38–126)
Anion gap: 16 — ABNORMAL HIGH (ref 5–15)
BUN: 33 mg/dL — ABNORMAL HIGH (ref 6–20)
CO2: 27 mmol/L (ref 22–32)
Calcium: 10.3 mg/dL (ref 8.9–10.3)
Chloride: 96 mmol/L — ABNORMAL LOW (ref 98–111)
Creatinine, Ser: 2.09 mg/dL — ABNORMAL HIGH (ref 0.61–1.24)
GFR, Estimated: 36 mL/min — ABNORMAL LOW (ref >60)
Glucose, Bld: 104 mg/dL — ABNORMAL HIGH (ref 70–99)
Potassium: 4.6 mmol/L (ref 3.5–5.1)
Sodium: 139 mmol/L (ref 135–145)
Total Bilirubin: 0.4 mg/dL (ref 0.3–1.2)
Total Protein: 7.1 g/dL (ref 6.5–8.1)

## 2023-03-24 LAB — CBC WITH DIFFERENTIAL/PLATELET
Abs Immature Granulocytes: 0.11 10*3/uL — ABNORMAL HIGH (ref 0.00–0.07)
Basophils Absolute: 0.1 10*3/uL (ref 0.0–0.1)
Basophils Relative: 1 %
Eosinophils Absolute: 0.8 10*3/uL — ABNORMAL HIGH (ref 0.0–0.5)
Eosinophils Relative: 6 %
HCT: 34.7 % — ABNORMAL LOW (ref 39.0–52.0)
Hemoglobin: 11.2 g/dL — ABNORMAL LOW (ref 13.0–17.0)
Immature Granulocytes: 1 %
Lymphocytes Relative: 19 %
Lymphs Abs: 2.3 10*3/uL (ref 0.7–4.0)
MCH: 29.1 pg (ref 26.0–34.0)
MCHC: 32.3 g/dL (ref 30.0–36.0)
MCV: 90.1 fL (ref 80.0–100.0)
Monocytes Absolute: 0.8 10*3/uL (ref 0.1–1.0)
Monocytes Relative: 6 %
Neutro Abs: 8.4 10*3/uL — ABNORMAL HIGH (ref 1.7–7.7)
Neutrophils Relative %: 67 %
Platelets: 350 10*3/uL (ref 150–400)
RBC: 3.85 MIL/uL — ABNORMAL LOW (ref 4.22–5.81)
RDW: 16.3 % — ABNORMAL HIGH (ref 11.5–15.5)
WBC: 12.4 10*3/uL — ABNORMAL HIGH (ref 4.0–10.5)
nRBC: 0 % (ref 0.0–0.2)

## 2023-03-24 LAB — GLUCOSE, CAPILLARY
Glucose-Capillary: 100 mg/dL — ABNORMAL HIGH (ref 70–99)
Glucose-Capillary: 108 mg/dL — ABNORMAL HIGH (ref 70–99)

## 2023-03-24 MED ORDER — HEPARIN SODIUM (PORCINE) 5000 UNIT/ML IJ SOLN
5000.0000 [IU] | Freq: Three times a day (TID) | INTRAMUSCULAR | Status: DC
  Administered 2023-03-24 – 2023-03-25 (×4): 5000 [IU] via SUBCUTANEOUS
  Filled 2023-03-24 (×5): qty 1

## 2023-03-24 NOTE — Evaluation (Signed)
Occupational Therapy Assessment and Plan  Raymond [name] Details  Name: Raymond [name] MRN: [id number] Date of Birth: 1965-10-25  OT Diagnosis: abnormal posture, acute pain, and muscle weakness (generalized)    03/24/23 1816  OT - End of Session  Activity Tolerance Decreased this session  Endurance Deficit Yes  OT Assessment  Rehab Potential (ACUTE ONLY) Good  OT Barriers to Discharge Inaccessible home environment;Home environment access/layout;Weight  OT Barriers to Discharge Comments body habitus, narrow doorways  OT Raymond [name] demonstrates impairments in the following area(s) Balance;Endurance;Cognition;Motor;Sensory;Skin Integrity  OT Basic ADL's Functional Problem(s) Grooming;Bathing;Dressing;Toileting  OT Advanced ADL's Functional Problem(s) Simple Meal Preparation;Light Housekeeping;Laundry  OT Transfers Functional Problem(s) Toilet;Tub/Shower  OT Plan  OT Intensity Minimum of 1-2 x/day, 45 to 90 minutes  OT Frequency 5 out of 7 days  OT Duration/Estimated Length of Stay 3 weeks  OT Treatment/Interventions Balance/vestibular training;Cognitive remediation/compensation;Community reintegration;Discharge planning;Disease mangement/prevention;DME/adaptive equipment instruction;Functional electrical stimulation;Functional mobility training;Neuromuscular re-education;Pain management;Raymond [name]/family education;Psychosocial support;Self Care/advanced ADL retraining;Skin care/wound managment;Splinting/orthotics;Therapeutic Activities;Therapeutic Exercise;UE/LE Strength taining/ROM;UE/LE Coordination activities;Visual/perceptual remediation/compensation;Wheelchair propulsion/positioning  OT Self Feeding Anticipated Outcome(s) indep  OT Basic Self-Care Anticipated Outcome(s) UB mod I, LB min A  OT Toileting Anticipated Outcome(s) min A  OT Bathroom Transfers Anticipated Outcome(s) min A  OT Recommendation  Recommendations for Other Services Neuropsych consult;Therapeutic Recreation consult  Therapeutic  Recreation Interventions Kitchen group;Stress management;Outing/community reintergration  Raymond [name] destination Home  Follow Up Recommendations Home health OT  Equipment Recommended Sliding board;Tub/shower bench;Wheelchair (measurements);Wheelchair cushion (measurements)  Equipment Details [place] need his own w/c and cushion, TTB  Individuals Consulted  Consulted and Agree with Results and Recommendations Raymond [name]   Today's Date: 03/24/2023 OT Individual Time: 0800-0900 OT Individual Time Calculation (min): 60 min     Hospital Problem: Principal Problem:   Thoracic myelopathy   Past Medical History:  Past Medical History:  Diagnosis Date   Anxiety    COPD (chronic obstructive pulmonary disease) (HCC)    Diabetes mellitus without complication (HCC)    Diverticulosis    with hx of LGIB   Edema    GERD (gastroesophageal reflux disease)    GIB (gastrointestinal bleeding)    Gout    Heavy smoker    Hyperlipidemia    Hypertension    Iron deficiency anemia    Sleep apnea    uses a cpap   Past Surgical History:  Past Surgical History:  Procedure Laterality Date   CHONDROPLASTY Left 06/28/2014   Procedure: CHONDROPLASTY;  Surgeon: Thera Flake., MD;  Location: [facility];  Service: Orthopedics;  Laterality: Left;   COLONOSCOPY     FOOT FASCIOTOMY     age 31-rt   KNEE ARTHROSCOPY WITH LATERAL MENISECTOMY Left 06/28/2014   Procedure: KNEE ARTHROSCOPY WITH LATERAL MENISECTOMY;  Surgeon: Thera Flake., MD;  Location: [facility];  Service: Orthopedics;  Laterality: Left;   KNEE ARTHROSCOPY WITH MEDIAL MENISECTOMY Left 06/28/2014   Procedure: LEFT KNEE ARTHROSCOPY CHONDROPLASTY/WITH MEDIAL/LATERAL MENISECTOMIES;  Surgeon: Thera Flake., MD;  Location:  SURGERY CENTER;  Service: Orthopedics;  Laterality: Left;   ORIF RADIUS & ULNA FRACTURES  2007   left   THORACIC DISCECTOMY N/A 03/12/2023   Procedure: THORACIC LAMINECTOMY AND  DISCECTOMY;   Surgeon: Coletta Memos, MD;  Location: [facility] OR;  Service: Neurosurgery;  Laterality: N/A;    Assessment & Plan Clinical Impression: Raymond [name] is a 57 year old male with history of COPD, T2DM, OSA, tobacco use, fibula fx/FTT 01/2023, CKD 111a, morbid obesity who was  admitted on 03/11/23 with BLE weakness with gait disorder, urinary incontinence and decreased coordination BLE. He ws found to have large HNP at T10/T11 and underwent T10 laminectomy with diskectomy by Dr. Franky Macho on 09/10. Post op he had issues with poor pain control and has been using IV dilaudid with oxycodone prn. Foley placed in ED and remains in place. Po intake has been good but has not had BM for 3 days and usually goes daily. Has been refusing CPAP but compliant at home. Sensation bilateral feet and ankle are improving. PT/OT has been working with Raymond [name] who requires Mod assist with Stedy to stand and min assist with ADLs. He was independent and working prior to this summer. CIR recommended due to functional decline.   Raymond [name] currently requires max with basic self-care skills and IADL secondary to muscle weakness, decreased cardiorespiratoy endurance, unbalanced muscle activation and decreased motor planning, and decreased sitting balance, decreased standing balance, decreased postural control, decreased balance strategies, and difficulty maintaining precautions.  Prior to hospitalization, Raymond [name] could complete since ~July pt has been using WC for mobility, propelling with bil UE or Bil LE. pt uses slide board and assist from spouse for bed<>WC transfers, was able to do it alone after SNF stay but requires increased assistance now. ADLs Comments: uses bari [facility] with side platform to transfer WC<>BSC for toileting, bed baths with assist from spouse. pt able to feed self and perform oral care and shave.  Raymond [name] will benefit from skilled intervention to decrease level of assist with basic self-care skills, increase independence with  basic self-care skills, and increase level of independence with iADL prior to discharge home with care partner.  Anticipate Raymond [name] will require 24 hour supervision and minimal physical assistance and follow up home health. OT Evaluation Precautions/Restrictions  Precautions Precautions: Fall Precaution Comments: spinal Restrictions Weight Bearing Restrictions: No   03/24/23 1816  OT - End of Session  Activity Tolerance Decreased this session  Endurance Deficit Yes  OT Assessment  Rehab Potential (ACUTE ONLY) Good  OT Barriers to Discharge Inaccessible home environment;Home environment access/layout;Weight  OT Barriers to Discharge Comments body habitus, narrow doorways  OT Raymond [name] demonstrates impairments in the following area(s) Balance;Endurance;Cognition;Motor;Sensory;Skin Integrity  OT Basic ADL's Functional Problem(s) Grooming;Bathing;Dressing;Toileting  OT Advanced ADL's Functional Problem(s) Simple Meal Preparation;Light Housekeeping;Laundry  OT Transfers Functional Problem(s) Toilet;Tub/Shower  OT Plan  OT Intensity Minimum of 1-2 x/day, 45 to 90 minutes  OT Frequency 5 out of 7 days  OT Duration/Estimated Length of Stay 3 weeks  OT Treatment/Interventions Balance/vestibular training;Cognitive remediation/compensation;Community reintegration;Discharge planning;Disease mangement/prevention;DME/adaptive equipment instruction;Functional electrical stimulation;Functional mobility training;Neuromuscular re-education;Pain management;Raymond [name]/family education;Psychosocial support;Self Care/advanced ADL retraining;Skin care/wound managment;Splinting/orthotics;Therapeutic Activities;Therapeutic Exercise;UE/LE Strength taining/ROM;UE/LE Coordination activities;Visual/perceptual remediation/compensation;Wheelchair propulsion/positioning  OT Self Feeding Anticipated Outcome(s) indep  OT Basic Self-Care Anticipated Outcome(s) UB mod I, LB min A  OT Toileting Anticipated Outcome(s) min A  OT  Bathroom Transfers Anticipated Outcome(s) min A  OT Recommendation  Recommendations for Other Services Neuropsych consult;Therapeutic Recreation consult  Therapeutic Recreation Interventions Kitchen group;Stress management;Outing/community reintergration  Raymond [name] destination Home  Follow Up Recommendations Home health OT  Equipment Recommended Sliding board;Tub/shower bench;Wheelchair (measurements);Wheelchair cushion (measurements)  Equipment Details [place] need his own w/c and cushion, TTB  Individuals Consulted  Consulted and Agree with Results and Recommendations Raymond [name]   Pain Pain Assessment Pain Scale: 0-10 Pain Score: 5  Pain Type: Surgical pain Pain Location: Back Pain Orientation: Mid Pain Descriptors / Indicators: Throbbing;Aching Pain Onset: On-going Patients Stated Pain Goal: 2 Pain Intervention(s): Pain med given  for lower pain score than stated, per Raymond [name] request;Rest;Repositioned;Distraction;Elevated extremity;Relaxation Multiple Pain Sites: No Home Living/Prior Functioning Home Living Available Help at Discharge: Family, Available 24 hours/day Type of Home: Mobile home Home Access: Ramped entrance Bathroom Shower/Tub: Tub/shower unit Bathroom Toilet: Standard Bathroom Accessibility: No Additional Comments: pt has been limited to [facility] mobility since hospital admission in July and dc'd to August to SNF for 2 weeks of rehab. Since returning home pt has worked with [name] therapy on transfers, has been able to stand for ~3 mins on some visits with HHPT. Raymond [name] naps in recliner but sleeps in regular flat bed, uses CPAP when sleeping. Sunday of fall pt was trying to stand alone while spouse out of home.  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 Prior Function Level of Independence: Needs assistance with ADLs, Needs assistance with homemaking, Independent with  transfers Driving: No Vocation: Retired Gaffer: retired Engineer, drilling Baseline Vision/History: 0 No visual deficits Ability to See in Adequate Light: 0 Adequate Raymond [name] Visual Report: No change from baseline Perception  Perception: Within Functional Limits Praxis Praxis: WFL Cognition Cognition Overall Cognitive Status: Within Functional Limits for tasks assessed Arousal/Alertness: Awake/alert Orientation Level: Person;Place;Situation Memory: Appears intact Attention: Focused Focused Attention: Appears intact Awareness: Appears intact Problem Solving: Appears intact Safety/Judgment: Appears intact Brief Interview for Mental Status (BIMS) Repetition of Three Words (First Attempt): 3 Temporal Orientation: Year: Correct Temporal Orientation: Month: Accurate within 5 days Temporal Orientation: Day: Correct Recall: "Sock": Yes, no cue required Recall: "Blue": Yes, no cue required Recall: "Bed": Yes, no cue required BIMS Summary Score: 15 Sensation Sensation Light Touch: Impaired by gross assessment Hot/Cold: Impaired by gross assessment Proprioception: Impaired by gross assessment Stereognosis: Appears Intact Coordination Gross Motor Movements are Fluid and Coordinated: No Fine Motor Movements are Fluid and Coordinated: No Motor  significant B LE weakness with poor ability to support weight during sit to stand attempts  Trunk/Postural Assessment    03/24/23 1811  Cervical Assessment  Cervical Assessment [facility]  Thoracic Assessment  Thoracic Assessment X  Lumbar Assessment  Lumbar Assessment X  Postural Control  Postural Control Deficits on evaluation  Protective Responses diminished  Postural Limitations forward head, kyphosis thoracic spine and posterior pelvis   Balance   03/24/23 1812  Balance  Balance Assessed Yes  Static Sitting Balance  Static Sitting - Balance Support Feet supported  Static Sitting - Level of Assistance 4: Min assist   Dynamic Sitting Balance  Dynamic Sitting - Balance Support Feet supported  Dynamic Sitting - Level of Assistance 3: Mod assist  Static Standing Balance  Static Standing - Balance Support Bilateral upper extremity supported  Static Standing - Level of Assistance 1: +1 Total assist  Dynamic Standing Balance  Dynamic Standing - Level of Assistance Not tested (comment)  Dynamic Standing - Comments unable on eval   Extremity/Trunk Assessment RUE Assessment RUE Assessment: Within Functional Limits LUE Assessment LUE Assessment: Within Functional Limits  Care Tool Care Tool Self Care Eating   Eating Assist Level: Set up assist    Oral Care    Oral Care Assist Level: Supervision/Verbal cueing    Bathing   Body parts bathed by Raymond [name]: Right arm;Left arm;Chest;Abdomen;Face Body parts bathed by helper: Front perineal area;Buttocks;Right upper leg;Left upper leg;Right lower leg;Left lower leg   Assist Level: Total Assistance - Raymond [name] < 25%    Upper Body Dressing(including orthotics)   What is  the Raymond [name] wearing?: Pull over shirt   Assist Level: Minimal Assistance - Raymond [name] > 75%    Lower Body Dressing (excluding footwear)   What is the Raymond [name] wearing?: Pants Assist for lower body dressing: Total Assistance - Raymond [name] < 25%    Putting on/Taking off footwear   What is the Raymond [name] wearing?: Socks;Ted hose Assist for footwear: Dependent - Raymond [name] 0%       Care Tool Toileting Toileting activity   Assist for toileting: 2 Helpers     Care Tool Bed Mobility Roll left and right activity   Roll left and right assist level: Contact Guard/Touching assist    Sit to lying activity   Sit to lying assist level: Contact Guard/Touching assist    Lying to sitting on side of bed activity         Care Tool Transfers Sit to stand transfer   Sit to stand assist level: 2 Helpers    Chair/bed transfer   Chair/bed transfer assist level: 2 Helpers     Toilet transfer   Assist Level:  2 Helpers     Care Tool Cognition  Expression of Ideas and Wants Expression of Ideas and Wants: 4. Without difficulty (complex and basic) - expresses complex messages without difficulty and with speech that is clear and easy to understand  Understanding Verbal and Non-Verbal Content Understanding Verbal and Non-Verbal Content: 4. Understands (complex and basic) - clear comprehension without cues or repetitions   Memory/Recall Ability Memory/Recall Ability : Current season;That he or she is in a hospital/hospital unit   Refer to Care Plan for Long Term Goals  SHORT TERM GOAL WEEK 1 OT Short Term Goal 1 (Week 1): Pt will transfer to [facility] with LRAD and mod A OT Short Term Goal 2 (Week 1): Pt will complete LB self care with AE with mod A OT Short Term Goal 3 (Week 1): Pt will stand with mod A with LRAD up to 1 min  Recommendations for other services: Neuropsych and Adult nurse group, Stress management, and Outing/community reintegration   Skilled Therapeutic Intervention ADL ADL Eating: Set up Where Assessed-Eating: Wheelchair Grooming: Setup Where Assessed-Grooming: Wheelchair Upper Body Bathing: Minimal assistance Where Assessed-Upper Body Bathing: Wheelchair Lower Body Bathing: Dependent Where Assessed-Lower Body Bathing: Wheelchair;Sitting at sink;Bed level Upper Body Dressing: Minimal assistance Where Assessed-Upper Body Dressing: Sitting at sink;Wheelchair Lower Body Dressing: Dependent Where Assessed-Lower Body Dressing: Sitting at sink;Standing at sink;Other (Comment);Wheelchair;Bed level (support of STEDY) Toileting: Maximal assistance Where Assessed-Toileting: Bedside Commode;Bed level Toilet Transfer: Dependent Toilet Transfer Method: Other (comment) (STEDY) Toilet Transfer Equipment: Extra wide drop arm bedside commode Tub/Shower Transfer: Not assessed Film/video editor: Not assessed ADL Comments: set up and CGA for UB self care seated sink  side, max a- total A for LB, STEDY to commode x 2 for safety but most likely can progress to TB Mobility    03/24/23 1021  CareTool - Bed Mobility  Roll left and right assist level Contact Guard/Touching assist  Sit to lying assist level Contact Guard/Touching assist  CareTool - Sit to stand transfer  Sit to stand assist level 2 Helpers  CareTool - Chair/bed transfer  Chair/bed transfer assist level 2 Helpers  CareTool - Toilet Transfers  Assist Level 2 Helpers    OT Interventions/Treatment:  Pt seen for full initial OT evaluation and training session this am. Pt in bed upon OT arrival. OT introduced role of therapy and purpose of session. Pt open to all presented assessment and training  this visit.  OT assisted and assessed ADL's, mobility, vision, sensation. cognition/lang, G/FMC, strength and balance throughout session. See above for levels. Pt will benefit from skilled OT services at CIR to maximize function and safety with recommendation to return home with mod I for UB BADL's, min A LB BADL's and transfers with [facility] services upon d/c home. Pt left at end of session in w/c with LE's elevated and chair alarm set, tray table and nurse call bell within reach.   Discharge Criteria: Raymond [name] will be discharged from OT if Raymond [name] refuses treatment 3 consecutive times without medical reason, if treatment goals not met, if there is a change in medical status, if Raymond [name] makes no progress towards goals or if Raymond [name] is discharged from hospital.  The above assessment, treatment plan, treatment alternatives and goals were discussed and mutually agreed upon: by Raymond [name]  Vicenta Dunning 03/24/2023, 12:37 PM

## 2023-03-24 NOTE — Progress Notes (Signed)
Inpatient Rehabilitation Center Individual Statement of Services  Patient Name:  Raymond [name]  Date:  03/24/2023  Welcome to the Inpatient Rehabilitation Center.  Our goal is to provide you with an individualized program based on your diagnosis and situation, designed to meet your specific needs.  With this comprehensive rehabilitation program, you will be expected to participate in at least 3 hours of rehabilitation therapies Monday-Friday, with modified therapy programming on the weekends.  Your rehabilitation program will include the following services:  Physical Therapy (PT), Occupational Therapy (OT), Speech Therapy (ST), 24 hour per day rehabilitation nursing, Therapeutic Recreaction (TR), Neuropsychology, Care Coordinator, Rehabilitation Medicine, Nutrition Services, Pharmacy Services, and Other  Weekly team conferences will be held on Wednesdays to discuss your progress.  Your Inpatient Rehabilitation Care Coordinator will talk with you frequently to get your input and to update you on team discussions.  Team conferences with you and your family in attendance [place] also be held.  Expected length of stay: 17-24 Days  Overall anticipated outcome: supervision to min assist wheel chair level   Depending on your progress and recovery, your program [place] change. Your Inpatient Rehabilitation Care Coordinator will coordinate services and will keep you informed of any changes. Your Inpatient Rehabilitation Care Coordinator's name and contact numbers are listed  below.  The following services [place] also be recommended but are not provided by the Inpatient Rehabilitation Center:   Home Health Rehabiltiation Services Outpatient Rehabilitation Services    Arrangements will be made to provide these services after discharge if needed.  Arrangements include referral to agencies that provide these services.  Your insurance has been verified to be:  Optician, dispensing Your primary doctor is:  Lyn Hollingshead, [place]  Pertinent information will be shared with your doctor and your insurance company.  Inpatient Rehabilitation Care Coordinator:  Lavera Guise, [place] [phone] or [phone]  Information discussed with and copy given to patient by: [name], 03/24/2023, 11:14 AM

## 2023-03-24 NOTE — Progress Notes (Signed)
PROGRESS NOTE   Subjective/Complaints: Cr 2.09 today, encouraged hydration, albumin 2.6, encouraged high protein diet for healing, discussed excellent blood sugars, his goals  ROS: +impaired ambulation   Objective:   DG Chest 2 View  Result Date: 03/24/2023 CLINICAL DATA:  Left-sided chest wall pain. EXAM: CHEST - 2 VIEW COMPARISON:  07/09/2021. FINDINGS: The heart is enlarged and mediastinal contours are within normal limits. Pulmonary vasculature is mildly distended. No consolidation, effusion, or pneumothorax. No acute osseous abnormality. IMPRESSION: Cardiomegaly with mildly distended pulmonary vasculature. Electronically Signed   By: Thornell Sartorius M.D.   On: 03/24/2023 00:42   Recent Labs    03/24/23 0730  WBC 12.4*  HGB 11.2*  HCT 34.7*  PLT 350   Recent Labs    03/24/23 0730  NA 139  K 4.6  CL 96*  CO2 27  GLUCOSE 104*  BUN 33*  CREATININE 2.09*  CALCIUM 10.3    Intake/Output Summary (Last 24 hours) at 03/24/2023 1130 Last data filed at 03/24/2023 0841 Gross per 24 hour  Intake 240 ml  Output 1350 ml  Net -1110 ml        Physical Exam: Vital Signs Blood pressure 129/73, pulse 89, temperature 98.5 F (36.9 C), temperature source Oral, resp. rate 16, height 5\' 11"  (1.803 m), weight (!) 146.2 kg. Gen: no distress, normal appearing HEENT: oral mucosa pink and moist, NCAT Cardio: Reg rate Chest:     Chest wall: Tenderness (left lateral chest) present.  Abdominal:     General: Bowel sounds are normal. There is no distension.     Palpations: Abdomen is soft.     Tenderness: There is no abdominal tenderness.  Musculoskeletal:        General: Tenderness (back) present. Normal range of motion.     Cervical back: Normal range of motion.     Right lower leg: Edema (trace) present.     Left lower leg: Left lower leg edema: trace.  Skin:    General: Skin is warm and dry.     Comments: Back incision  C/D/Intact. Dry scab on left shin. Stasis changes BLE.   Neurological:     Mental Status: He is alert.     Comments: Alert and oriented x 3. Normal insight and awareness. Intact Memory. Normal language and speech. Cranial nerve exam unremarkable. MMT: UE 5/5 bilaterally. RLE 3+ to 4-/5 HF, KE and 4/5 ADF/PF.  LLE 4/5 HF,KE, 4+ADF/PF. Pt senses pain and light touch equally on all 4's.  DTR's 3+ in both LE's. 3-4 beats of clonus each foot. Mild extensor tone in LE's, exam stable 10/22 Psychiatric:        Mood and Affect: Mood normal.        Behavior: Behavior normal.        Thought Content: Thought content normal.    Assessment/Plan: 1. Functional deficits which require 3+ hours per day of interdisciplinary therapy in a comprehensive inpatient rehab setting. Physiatrist is providing close team supervision and 24 hour management of active medical problems listed below. Physiatrist and rehab team continue to assess barriers to discharge/monitor patient progress toward functional and medical goals  Care Tool:  Bathing  Body parts bathed by patient: Right arm, Left arm, Chest, Abdomen, Face   Body parts bathed by helper: Front perineal area, Buttocks, Right upper leg, Left upper leg, Right lower leg, Left lower leg     Bathing assist Assist Level: Total Assistance - Patient < 25%     Upper Body Dressing/Undressing Upper body dressing   What is the patient wearing?: Pull over shirt    Upper body assist Assist Level: Minimal Assistance - Patient > 75%    Lower Body Dressing/Undressing Lower body dressing      What is the patient wearing?: Pants     Lower body assist Assist for lower body dressing: Total Assistance - Patient < 25%     Toileting Toileting    Toileting assist Assist for toileting: 2 Helpers     Transfers Chair/bed transfer  Transfers assist     Chair/bed transfer assist level: 2 Helpers     Locomotion Ambulation   Ambulation assist               Walk 10 feet activity   Assist           Walk 50 feet activity   Assist           Walk 150 feet activity   Assist           Walk 10 feet on uneven surface  activity   Assist           Wheelchair     Assist               Wheelchair 50 feet with 2 turns activity    Assist            Wheelchair 150 feet activity     Assist          Blood pressure 129/73, pulse 89, temperature 98.5 F (36.9 C), temperature source Oral, resp. rate 16, height 5\' 11"  (1.803 m), weight (!) 146.2 kg.  Medical Problem List and Plan: 1. 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 Pt s/p T10 laminectomy and discectomy on 03/12/23.              -patient Ganci shower             -ELOS/Goals: 17-24 days, goals supervision to min assist at w/c level             -Bilateral PRAFO's  Continue CIR  Grounds pass ordered 2.  Antithrombotics: -DVT/anticoagulation:  Pharmaceutical: Change heparin to Lovenox.  Will check surveillance dopplers due to immobility.              -antiplatelet therapy: N/A 3. Pain Management: Oxycodone prn--> has been using IV dilaudid couple of times a day             --He feels that dilaudid works better but oxycodone last longer. Will schedule oxycodone 10 mg TID for now              --Wean down as pain/activity improves.    4. Mood/Behavior/Sleep: LCSW to follow for evaluation and support.              -antipsychotic agents: N/A 5. Neuropsych/cognition: This patient is capable of making decisions on his own behalf. 6. Skin/Wound Care: back incision is CDI. Litsey be left open to air  7. Fluids/Electrolytes/Nutrition: Monitor I/O. Check CMET in am  8. Spasticity: slightly improved with addition of low dose baclofen last week. Increase baclofen to 5mg   TID for now 9. HTN: Monitor BS TID--continue Amlodipine,  Coreg, Cardura and Lasix bid             --labile with SBP ranging 130-160 during the day.  Monitor for orthostatic symptoms.  10.  OSA: CPAP at nights-->Is compliant with his machine and advised him to ask his wife to bring it from home. 11. T2DM: Hgb A1C- 6.0 and well controlled.  D/c CBGs and insulin.  BS stable on glucotrol and metformin daily in am.  --monitor for changes with increase in activity.  10. COPD: Has intermittent cough " from 40 yrs of smoking"             --resume MDI. 11. Hypokalemia: Likely due to diuretic/intake. Will continue K dur  30 meq BID --K- 3.3 on 10/09 (admission)-->recheck BMET in am 12. CKD: Baseline SCr around 1.3--currently 2.09, encouraged 6-8 glasses of water daily and repeat BMP tomorrow. Advised not to use Goodys or Mobic.  13. Leucocytosis: Monitor for fevers and other signs of infection. WBC was up to 13.3 after surgery.             --recheck WBC in am 14. Acute on chronic anemia: Recheck H/H in am 15. Constipation/neurogenic bowel.  Last BM documented on 10/18. -- On Senna 1 bid--> change to 2 in am. Sorbitol 45 cc tonight. -dulcolax supp in pm -probably doesn't need a bowel program 16.  Neurogenic bladder: Continue Flomax and Cardura. Foley was placed at admission             -continue foley tonight.  -Voiding trial in AM  17. Morbid obesity: BMI -44. Was 317 at admission and down to 315 lbs. He would like to lose more weight.  --Will need ongoing education on diet as well as importance of weight loss to promote mobility and wound healing 18.  Anxiety d/o: On Cymbalta with prn Xanax.   19.  Left chest wall/rib pain: Has been ongoing since 4 days and feels that is positional. Lidocaine patches at night as pain worse then.  --Appears MS in nature but will check CXR to rule out rib fracture/infection.   20. Hypoalbuminemia: encouraged prioritizing protein in diet    LOS: 1 days A FACE TO FACE EVALUATION WAS PERFORMED  Romy Ipock P Leovardo Thoman 03/24/2023, 11:30 AM

## 2023-03-24 NOTE — Progress Notes (Signed)
Inpatient Rehabilitation  Patient information reviewed and entered into eRehab system by Oyuki Hogan M. Eri Mcevers, M.A., CCC/SLP, PPS Coordinator.  Information including medical coding, functional ability and quality indicators will be reviewed and updated through discharge.    

## 2023-03-24 NOTE — Progress Notes (Signed)
Met with patient who was up in W/C with therapy.  Introduced self and roll.  Informed of team conference on every Wednesday to discuss goals, barriers to discharge, equipment needs, and discharge date. Therapy in room. All needs met.

## 2023-03-24 NOTE — Progress Notes (Signed)
Inpatient Rehabilitation Admission Medication Review by a Pharmacist  A complete drug regimen review was completed for this patient to identify any potential clinically significant medication issues.  High Risk Drug Classes Is patient taking? Indication by Medication  Antipsychotic Yes Campazine - nausea/vomiting  Anticoagulant No   Antibiotic No   Opioid Yes Oxycodone - pain - prn and scheduled  Antiplatelet No   Hypoglycemics/insulin Yes Glipizide - DM Metformin - DM SSI - DM  Vasoactive Medication Yes Amlodipine - HTN Carvedilol - HF Cardura - HTN Flomax - BPH Lasix - HF Losartan - HF  Chemotherapy No   Other Yes Allopurinol - gout Atorvastatin - HLD Baclofen/flexeril - muscle spasm Dulcolax, Senna, Fleets - constipation Duloxetine - MDD Kcl - supplementation Lidoderm - pain Pantoprazole, Maalox - GERD Mirapex - RLS Xanax - anxiety Albuterol - SOA Benedryl - itching Robutussin - cough Melatonin - sleep     Type of Medication Issue Identified Description of Issue Recommendation(s)  Drug Interaction(s) (clinically significant)     Duplicate Therapy  Patient on Cardura and Flomax since PTA Might consider maximizing one or the other to decrease pill burden  Allergy     No Medication Administration End Date     Incorrect Dose     Additional Drug Therapy Needed  Patient was on DVT prophylaxis during IP admission (Heparin subcutaneous) Consider adding VTE Prophylaxis  Significant med changes from prior encounter (inform family/care partners about these prior to discharge).    Other       Clinically significant medication issues were identified that warrant physician communication and completion of prescribed/recommended actions by midnight of the next day:  Yes  Name of provider notified for urgent issues identified: Dr. Dalene Carrow  Provider Method of Notification: Secure Chat    Pharmacist comments:   Time spent performing this drug regimen review (minutes):   30   Jeanella Cara, PharmD, [place] Clinical Pharmacist Please see AMION for all Pharmacists' Contact Phone Numbers 03/24/2023, 7:38 AM

## 2023-03-24 NOTE — Plan of Care (Signed)
  Problem: RH Balance Goal: LTG Patient will maintain dynamic standing with ADLs (OT) Description: LTG:  Patient will maintain dynamic standing balance with assist during activities of daily living (OT)  Flowsheets (Taken 03/24/2023 1220) LTG: Pt will maintain dynamic standing balance during ADLs with: Minimal Assistance - Patient > 75%

## 2023-03-24 NOTE — Plan of Care (Signed)
Problem: RH Balance Goal: LTG Patient will maintain dynamic standing with ADLs (OT) Description: LTG:  Patient will maintain dynamic standing balance with assist during activities of daily living (OT)  Flowsheets (Taken 03/24/2023 1220) LTG: Pt will maintain dynamic standing balance during ADLs with: Minimal Assistance - Patient > 75%   Problem: RH Balance Goal: LTG: Patient will maintain dynamic sitting balance (OT) Description: LTG:  Patient will maintain dynamic sitting balance with assistance during activities of daily living (OT) Flowsheets (Taken 03/24/2023 1220) LTG: Pt will maintain dynamic sitting balance during ADLs with: Independent with assistive device Goal: LTG Patient will maintain dynamic standing with ADLs (OT) Description: LTG:  Patient will maintain dynamic standing balance with assist during activities of daily living (OT)  Flowsheets (Taken 03/24/2023 1220) LTG: Pt will maintain dynamic standing balance during ADLs with: Minimal Assistance - Patient > 75%   Problem: Sit to Stand Goal: LTG:  Patient will perform sit to stand in prep for activites of daily living with assistance level (OT) Description: LTG:  Patient will perform sit to stand in prep for activites of daily living with assistance level (OT) Flowsheets (Taken 03/24/2023 1220) LTG: PT will perform sit to stand in prep for activites of daily living with assistance level: Minimal Assistance - Patient > 75%   Problem: RH Grooming Goal: LTG Patient will perform grooming w/assist,cues/equip (OT) Description: LTG: Patient will perform grooming with assist, with/without cues using equipment (OT) Flowsheets (Taken 03/24/2023 1220) LTG: Pt will perform grooming with assistance level of: Independent with assistive device    Problem: RH Bathing Goal: LTG Patient will bathe all body parts with assist levels (OT) Description: LTG: Patient will bathe all body parts with assist levels (OT) Flowsheets (Taken  03/24/2023 1220) LTG: Pt will perform bathing with assistance level/cueing: Minimal Assistance - Patient > 75%   Problem: RH Dressing Goal: LTG Patient will perform upper body dressing (OT) Description: LTG Patient will perform upper body dressing with assist, with/without cues (OT). Flowsheets (Taken 03/24/2023 1220) LTG: Pt will perform upper body dressing with assistance level of: Independent with assistive device Goal: LTG Patient will perform lower body dressing w/assist (OT) Description: LTG: Patient will perform lower body dressing with assist, with/without cues in positioning using equipment (OT) Flowsheets (Taken 03/24/2023 1220) LTG: Pt will perform lower body dressing with assistance level of: Minimal Assistance - Patient > 75%   Problem: RH Toileting Goal: LTG Patient will perform toileting task (3/3 steps) with assistance level (OT) Description: LTG: Patient will perform toileting task (3/3 steps) with assistance level (OT)  Flowsheets (Taken 03/24/2023 1220) LTG: Pt will perform toileting task (3/3 steps) with assistance level: Supervision/Verbal cueing   Problem: RH Toilet Transfers Goal: LTG Patient will perform toilet transfers w/assist (OT) Description: LTG: Patient will perform toilet transfers with assist, with/without cues using equipment (OT) Flowsheets (Taken 03/24/2023 1220) LTG: Pt will perform toilet transfers with assistance level of: Minimal Assistance - Patient > 75%   Problem: RH Tub/Shower Transfers Goal: LTG Patient will perform tub/shower transfers w/assist (OT) Description: LTG: Patient will perform tub/shower transfers with assist, with/without cues using equipment (OT) Flowsheets (Taken 03/24/2023 1220) LTG: Pt will perform tub/shower stall transfers with assistance level of: Minimal Assistance - Patient > 75%   Problem: RH Balance Goal: LTG: Patient will maintain dynamic sitting balance (OT) Description: LTG:  Patient will maintain dynamic sitting  balance with assistance during activities of daily living (OT) Flowsheets (Taken 03/24/2023 1220) LTG: Pt will maintain dynamic sitting balance during ADLs  with: Independent with assistive device Goal: LTG Patient will maintain dynamic standing with ADLs (OT) Description: LTG:  Patient will maintain dynamic standing balance with assist during activities of daily living (OT)  Flowsheets (Taken 03/24/2023 1220) LTG: Pt will maintain dynamic standing balance during ADLs with: Minimal Assistance - Patient > 75%   Problem: Sit to Stand Goal: LTG:  Patient will perform sit to stand in prep for activites of daily living with assistance level (OT) Description: LTG:  Patient will perform sit to stand in prep for activites of daily living with assistance level (OT) Flowsheets (Taken 03/24/2023 1220) LTG: PT will perform sit to stand in prep for activites of daily living with assistance level: Minimal Assistance - Patient > 75%   Problem: RH Grooming Goal: LTG Patient will perform grooming w/assist,cues/equip (OT) Description: LTG: Patient will perform grooming with assist, with/without cues using equipment (OT) Flowsheets (Taken 03/24/2023 1220) LTG: Pt will perform grooming with assistance level of: Independent with assistive device    Problem: RH Bathing Goal: LTG Patient will bathe all body parts with assist levels (OT) Description: LTG: Patient will bathe all body parts with assist levels (OT) Flowsheets (Taken 03/24/2023 1220) LTG: Pt will perform bathing with assistance level/cueing: Minimal Assistance - Patient > 75%   Problem: RH Dressing Goal: LTG Patient will perform upper body dressing (OT) Description: LTG Patient will perform upper body dressing with assist, with/without cues (OT). Flowsheets (Taken 03/24/2023 1220) LTG: Pt will perform upper body dressing with assistance level of: Independent with assistive device Goal: LTG Patient will perform lower body dressing w/assist  (OT) Description: LTG: Patient will perform lower body dressing with assist, with/without cues in positioning using equipment (OT) Flowsheets (Taken 03/24/2023 1220) LTG: Pt will perform lower body dressing with assistance level of: Minimal Assistance - Patient > 75%   Problem: RH Toileting Goal: LTG Patient will perform toileting task (3/3 steps) with assistance level (OT) Description: LTG: Patient will perform toileting task (3/3 steps) with assistance level (OT)  Flowsheets (Taken 03/24/2023 1220) LTG: Pt will perform toileting task (3/3 steps) with assistance level: Supervision/Verbal cueing   Problem: RH Toilet Transfers Goal: LTG Patient will perform toilet transfers w/assist (OT) Description: LTG: Patient will perform toilet transfers with assist, with/without cues using equipment (OT) Flowsheets (Taken 03/24/2023 1220) LTG: Pt will perform toilet transfers with assistance level of: Minimal Assistance - Patient > 75%   Problem: RH Tub/Shower Transfers Goal: LTG Patient will perform tub/shower transfers w/assist (OT) Description: LTG: Patient will perform tub/shower transfers with assist, with/without cues using equipment (OT) Flowsheets (Taken 03/24/2023 1220) LTG: Pt will perform tub/shower stall transfers with assistance level of: Minimal Assistance - Patient > 75%

## 2023-03-24 NOTE — Progress Notes (Signed)
Inpatient Rehabilitation Care Coordinator Assessment and Plan Patient Details  Name: Raymond [name] MRN: [id number] Date of Birth: 1965/07/20  Today's Date: 03/24/2023  Hospital Problems: Principal Problem:   Thoracic myelopathy  Past Medical History:  Past Medical History:  Diagnosis Date   Anxiety    COPD (chronic obstructive pulmonary disease) (HCC)    Diabetes mellitus without complication (HCC)    Diverticulosis    with hx of LGIB   Edema    GERD (gastroesophageal reflux disease)    GIB (gastrointestinal bleeding)    Gout    Heavy smoker    Hyperlipidemia    Hypertension    Iron deficiency anemia    Sleep apnea    uses a cpap   Past Surgical History:  Past Surgical History:  Procedure Laterality Date   CHONDROPLASTY Left 06/28/2014   Procedure: CHONDROPLASTY;  Surgeon: Thera Flake., MD;  Location: [facility];  Service: Orthopedics;  Laterality: Left;   COLONOSCOPY     FOOT FASCIOTOMY     age 57-rt   KNEE ARTHROSCOPY WITH LATERAL MENISECTOMY Left 06/28/2014   Procedure: KNEE ARTHROSCOPY WITH LATERAL MENISECTOMY;  Surgeon: Thera Flake., MD;  Location: [facility];  Service: Orthopedics;  Laterality: Left;   KNEE ARTHROSCOPY WITH MEDIAL MENISECTOMY Left 06/28/2014   Procedure: LEFT KNEE ARTHROSCOPY CHONDROPLASTY/WITH MEDIAL/LATERAL MENISECTOMIES;  Surgeon: Thera Flake., MD;  Location: [facility];  Service: Orthopedics;  Laterality: Left;   ORIF RADIUS & ULNA FRACTURES  2007   left   THORACIC DISCECTOMY N/A 03/12/2023   Procedure: THORACIC LAMINECTOMY AND  DISCECTOMY;  Surgeon: Coletta Memos, MD;  Location: [facility] OR;  Service: Neurosurgery;  Laterality: N/A;   Social History:  reports that he has been smoking cigarettes. He has never used smokeless tobacco. He reports current alcohol use. He reports that he does not use drugs.  Family / Support Systems Marital Status: Married How Long?: N/A Patient Roles: Spouse, Parent  (Has step daughter) Spouse/Significant Other: [name] Children: Step daughter Other Supports: N/A Anticipated Caregiver: Spouse Ability/Limitations of Caregiver: Spouse does not work and able to Producer, television/film/video Availability: 24/7 Family Dynamics: supportive spouse  Social History Preferred language: English Religion: Christian Cultural Background: n/a Education: HS Health Literacy - How often do you need to have someone help you when you read instructions, pamphlets, or other written material from your doctor or pharmacy?: Always Writes: Yes Employment Status: Employed Name of Employer: [employer] Return to Work Plans: TBD Marine scientist Issues: N/A Guardian/Conservator: N/A   Abuse/Neglect Abuse/Neglect Assessment Can Be Completed: Yes Physical Abuse: Denies Verbal Abuse: Denies Sexual Abuse: Denies Exploitation of patient/patient's resources: Denies Self-Neglect: Denies  Patient response to: Social Isolation - How often do you feel lonely or isolated from those around you?: Sometimes  Emotional Status Pt's affect, behavior and adjustment status: Pleasant, spouse at bedside Recent Psychosocial Issues: Coping Psychiatric History: hx of anixety Substance Abuse History: Heaby smoker  Patient / Family Perceptions, Expectations & Goals Pt/Family understanding of illness & functional limitations: yes, spouse at bedside Premorbid pt/family roles/activities: Patient has been [place] bound since July 2024 and SNF admission in August. Spouse assiting overall and pt able to self feed Anticipated changes in roles/activities/participation: Spouse anticpates assisting at d/c with some assistamce if needed from step daughter next door. Pt/family expectations/goals: sup to min A wc level  [employer]: None Premorbid Home Care/DME Agencies: Other (Comment) (RW, BSC, WC, Rollator) Transportation available at  discharge: spouse Is the patient able to  respond to transportation needs?: Yes In the past 12 months, has lack of transportation kept you from medical appointments or from getting medications?: No In the past 12 months, has lack of transportation kept you from meetings, work, or from getting things needed for daily living?: No Resource referrals recommended: Neuropsychology  Discharge Planning Support Systems: Other relatives, Spouse/significant other Type of Residence: Private residence (1 level home, ramp entry) Insurance Resources: Media planner (specify) Counselling psychologist) Financial Resources: Employment, Actuary Screen Referred: No Living Expenses: Psychologist, sport and exercise Management: Patient, Spouse Does the patient have any problems obtaining your medications?: No Home Management: Assistance from spouse prior Patient/Family Preliminary Plans: Spouse plans to continue to assist. Care Coordinator Barriers to Discharge: Lack of/limited family support, Insurance for SNF coverage, Decreased caregiver support Care Coordinator Anticipated Follow Up Needs: HH/OP DC Planning Additional Notes/Comments: Visual defecits of spouse, Expected length of stay: 17-24 Days  Clinical Impression SW met with patient and spouse in the room, introduced self and explained role. Patient plans to return home with with spouse to assist 24/7. Step daughter lives next door and able to assist if needed. Patient has been WC level since July 2024 and recent admitted to SNF for 2 weeks August 2024. Spouse assists with ADLS, patient able to self feed. Patient currently has a [place] BSC, RW, [facility], rollator and Development worker, international aid. No additional questions or concerns currently, SW will follow up with pt and spouse tomorrow for conference updates and to assist with paperwork.   Raymond [name] 03/24/2023, 1:12 PM

## 2023-03-25 ENCOUNTER — Observation Stay (HOSPITAL_COMMUNITY): Payer: Managed Care, Other (non HMO)

## 2023-03-25 ENCOUNTER — Other Ambulatory Visit (HOSPITAL_COMMUNITY): Payer: Self-pay

## 2023-03-25 DIAGNOSIS — M4714 Other spondylosis with myelopathy, thoracic region: Secondary | ICD-10-CM | POA: Diagnosis not present

## 2023-03-25 DIAGNOSIS — M623 Immobility syndrome (paraplegic): Secondary | ICD-10-CM | POA: Diagnosis not present

## 2023-03-25 LAB — BASIC METABOLIC PANEL
Anion gap: 9 (ref 5–15)
BUN: 37 mg/dL — ABNORMAL HIGH (ref 6–20)
CO2: 27 mmol/L (ref 22–32)
Calcium: 9.6 mg/dL (ref 8.9–10.3)
Chloride: 98 mmol/L (ref 98–111)
Creatinine, Ser: 2.07 mg/dL — ABNORMAL HIGH (ref 0.61–1.24)
GFR, Estimated: 36 mL/min — ABNORMAL LOW (ref >60)
Glucose, Bld: 99 mg/dL (ref 70–99)
Potassium: 3.6 mmol/L (ref 3.5–5.1)
Sodium: 134 mmol/L — ABNORMAL LOW (ref 135–145)

## 2023-03-25 LAB — URINALYSIS, ROUTINE W REFLEX MICROSCOPIC
Bilirubin Urine: NEGATIVE
Glucose, UA: NEGATIVE mg/dL
Ketones, ur: NEGATIVE mg/dL
Nitrite: NEGATIVE
Protein, ur: 100 mg/dL — AB
Specific Gravity, Urine: 1.01 (ref 1.005–1.030)
WBC, UA: >50 WBC/hpf (ref 0–5)
pH: 5 (ref 5.0–8.0)

## 2023-03-25 LAB — MAGNESIUM: Magnesium: 1.7 mg/dL (ref 1.7–2.4)

## 2023-03-25 MED ORDER — APIXABAN 5 MG PO TABS
5.0000 mg | ORAL_TABLET | Freq: Two times a day (BID) | ORAL | Status: DC
  Administered 2023-04-01 – 2023-04-10 (×18): 5 mg via ORAL
  Filled 2023-03-25 (×19): qty 1

## 2023-03-25 MED ORDER — FUROSEMIDE 40 MG PO TABS
40.0000 mg | ORAL_TABLET | Freq: Two times a day (BID) | ORAL | Status: DC
  Administered 2023-03-25 – 2023-03-26 (×2): 40 mg via ORAL
  Filled 2023-03-25 (×2): qty 1

## 2023-03-25 MED ORDER — APIXABAN 5 MG PO TABS
10.0000 mg | ORAL_TABLET | Freq: Two times a day (BID) | ORAL | Status: AC
  Administered 2023-03-25 – 2023-04-01 (×14): 10 mg via ORAL
  Filled 2023-03-25 (×14): qty 2

## 2023-03-25 NOTE — Progress Notes (Signed)
Patient reported to have Right gastrocnemius DVT, right peroneal DVT and left popliteal DVT. He is 13 days post op --discussed with Dr. Dalene Carrow. Eliquis added for DVT treatment.

## 2023-03-25 NOTE — Progress Notes (Signed)
Occupational Therapy Session Note  Patient Details  Name: Raymond [name] MRN: [id number] Date of Birth: 1966/01/12  Today's Date: 03/25/2023 OT Individual Time: 4098-1191 & 1435-1530 OT Individual Time Calculation (min): 40 min & 55 min   Short Term Goals: Week 1:  OT Short Term Goal 1 (Week 1): Pt will transfer to [facility] with LRAD and mod A OT Short Term Goal 2 (Week 1): Pt will complete LB self care with AE with mod A OT Short Term Goal 3 (Week 1): Pt will stand with mod A with LRAD up to 1 min  Skilled Therapeutic Interventions/Progress Updates:  Session 1 Skilled OT intervention completed with focus on toileting needs, slideboard transfers and BUE exercises. Pt received seated in w/c with urgent need to void. Pt agreeable to session. 4/10 pain reported in back at surgical site; pre-medicated. OT offered rest breaks, repositioning throughout for pain reduction.  Pt able to position himself for toileting but needed assist for placing urinal then pt able to manage. With time, pt continent of urinary void with c/o burning sensation; MD/nurse notified and clean urinal retrieved for future clean catch for UA/UC.  Set up A for hand hygiene. Transported dependently in w/c <> gym for time. Pt utilized slideboard for transfers at home PTA due to w/c bound status therefore focused on these transfers during session. Required total A for board placement, then mod A of 1 min A of 2nd person > Rt on board to EOM with max cues and assist for BLE placement, hip/head relationship as well as w/c stability.   Able to sit EOM without LOB for the following BUE exercises however pt utilizing BUE intermittently for stability due to increase in pain: (With 2 lb med ball) -20 chest press -20 bicep curls  Pt required total A board placement, Max A of 1 on board and +2 for w/c stability > Lt side > w/c with again max cues and OT blocking at both knees due to impaired sensation and frequency of knee extension,  requiring cues/physical assist to bring legs underneath him for leverage/safety.   Back in room, pr remained seated in w/c, with belt alarm on/activated, and with all needs in reach at end of session.  Session 2 Skilled OT intervention completed with focus on toileting/education, functional sit <> stands. Pt received seated in w/c, agreeable to session. No pain reported however 7/10 fatigue level.  Pt declined self-care needs at current time. Transported dependently in w/c > gym. Upon getting to gym in prepping for transfer, pt reported urge to have BM. Transported back to room.  Utilized stedy with max A +2 needed to stand, then dependent transport with +2 needed to manage stedy over bathroom threshold due to pt weight > BSC over toilet. MOD A +2 sit > stand in stedy, total A to lower clothing. Pt required time for toileting but continent of loose BM only, no urinary void; nurse notified. +2 needed to manage toileting steps however pt able to maintain stance with min A of 1 but for short amount of time.  Dependent transfer > EOB per pt request due to fatigue. Max A +2 bed mobility and boosting > HOB with cues needed for spinal precautions as pt with poor adherence. Doffed shoes/TEDs with total A. Pt noted to have positive babinski sign on BLE with LLE > RLE with care team aware per chart.  Pt remained semi supine in bed, with bed alarm on/activated, and with all needs in reach at end of  session.   Therapy Documentation Precautions:  Precautions Precautions: Fall, Back Precaution Comments: spinal Restrictions Weight Bearing Restrictions: No    Therapy/Group: Individual Therapy  Melvyn Novas, MS, OTR/L  03/25/2023, 3:55 PM

## 2023-03-25 NOTE — Discharge Instructions (Addendum)
Inpatient Rehab Discharge Instructions  Raymond [name] Discharge date and time: 04/10/23   Activities/Precautions/ Functional Status: Activity: no lifting, driving, or strenuous exercise till cleared by MD Diet: cardiac diet and diabetic diet. Need to increase water intake--at least 5 glasses daily.  Wound Care: keep wound clean and dry. Contact Dr. Franky Macho if you develop any problems with your incision/wound--redness, swelling, increase in pain, drainage or if you develop fever or chills.    Functional status:  ___ No restrictions             ___ Walk up steps independently _X__ 24/7 supervision/assistance   ___ Walk up steps with assistance ___ Intermittent supervision/assistance  ___ Bathe/dress independently ___ Walk with walker     _X__ Bathe/dress with assistance ___ Walk Independently            ___ Shower independently ___ Walk with assistance           ___ Shower with assistance ___ No alcohol                     ___ Return to work/school ________    Special Instructions: No bending, twisting or arching.  2. Wear CPAP at nights and whenever napping 3. Elevate legs when seated and LOW/NO salt diet to prevent swelling.   COMMUNITY REFERRALS UPON DISCHARGE:     Outpatient: PT     OT                Agency: Outpatient at [facility]  Phone: [phone]              Appointment Date/Time: Please allow 5-7 days for scheduling to reach out.  Medical Equipment/Items Ordered: Hospital Bed and Tub Transfer Bench                                                 Agency/Supplier: [employer] [phone]   My questions have been answered and I understand these instructions. I will adhere to these goals and the provided educational materials after my discharge from the hospital.  Patient/Caregiver Signature _______________________________ Date __________  Clinician Signature _______________________________________ Date __________  Please bring this form and your medication list with you  to all your follow-up doctor's appointments.     Information on my medicine - ELIQUIS (apixaban)  This medication education was reviewed with me or my healthcare representative as part of my discharge preparation.    Why was Eliquis prescribed for you? Eliquis was prescribed to treat blood clots that Raymond [name] have been found in the veins of your legs (deep vein thrombosis) or in your lungs (pulmonary embolism) and to reduce the risk of them occurring again.  What do You need to know about Eliquis ? The starting dose is 10 mg (two 5 mg tablets) taken TWICE daily for the FIRST SEVEN (7) DAYS, then on 04/01/2023  the dose is reduced to ONE 5 mg tablet taken TWICE daily.  Eliquis Raymond [name] be taken with or without food.   Try to take the dose about the same time in the morning and in the evening. If you have difficulty swallowing the tablet whole please discuss with your pharmacist how to take the medication safely.  Take Eliquis exactly as prescribed and DO NOT stop taking Eliquis without talking to the doctor who prescribed the medication.  Stopping Raymond [name] increase your  risk of developing a new blood clot.  Refill your prescription before you run out.  After discharge, you should have regular check-up appointments with your healthcare provider that is prescribing your Eliquis.    What do you do if you miss a dose? If a dose of ELIQUIS is not taken at the scheduled time, take it as soon as possible on the same day and twice-daily administration should be resumed. The dose should not be doubled to make up for a missed dose.  Important Safety Information A possible side effect of Eliquis is bleeding. You should call your healthcare provider right away if you experience any of the following: Bleeding from an injury or your nose that does not stop. Unusual colored urine (red or dark brown) or unusual colored stools (red or black). Unusual bruising for unknown reasons. A serious fall or if you hit  your head (even if there is no bleeding).  Some medicines Marty interact with Eliquis and might increase your risk of bleeding or clotting while on Eliquis. To help avoid this, consult your healthcare provider or pharmacist prior to using any new prescription or non-prescription medications, including herbals, vitamins, non-steroidal anti-inflammatory drugs (NSAIDs) and supplements.  This website has more information on Eliquis (apixaban): http://www.eliquis.com/eliquis/home

## 2023-03-25 NOTE — Progress Notes (Signed)
Patient ID: Raymond [name], male   DOB: [birth year], 57 y.o.   MRN: [id number]  Team Conference Report to Patient/Family  Team Conference discussion was reviewed with the patient and caregiver, including goals, any changes in plan of care and target discharge date.  Patient and caregiver express understanding and are in agreement.  The patient has a target discharge date of 04/08/23.  SW met with patient and spouse in room and provided conference updates. Pt and spouse will provide SW will fax number where to provide clinicals once they obtain the number. No additional questions or concerns.   [name] 03/25/2023, 1:51 PM

## 2023-03-25 NOTE — Progress Notes (Signed)
Physical Therapy Session Note  Patient Details  Name: Raymond [name] MRN: [id number] Date of Birth: 1965/09/30  Today's Date: 03/24/2023 PT Individual Time:  1302-1415  PT Individual Time Calculation (min): 73 min  Short Term Goals: Week 1:  PT Short Term Goal 1 (Week 1): Pt will perform bed mobility with consistent MinA and abiding by spinal precautions. PT Short Term Goal 2 (Week 1): Pt will perform sit<>stand from elevated height with knee guard and ModA +2 PT Short Term Goal 3 (Week 1): Pt will perform slide board transfers in either direction with MinA/ CGA. PT Short Term Goal 4 (Week 1): Pt will propel w/c >25 ft with MinA.  Skilled Therapeutic Interventions/Progress Updates:  Patient supine in bed on entrance to room. Patient alert and agreeable to PT session.   Patient with no pain complaint at start of session.  Pt and wife with many questions throughout session. Addressed to best of knowledge and within scope of practice.   Pt and wife taken on tour of unit to orient both and calm pt's fears as well as to get out of room for improved mood and understanding of expectations during IPR stay.  Therapeutic Activity: Bed Mobility: Pt performed supine > sit with MinA to maintain back precautions. Is able to push up to sitting EOB. VC/ tc required for technique. Transfers: Pt performed sit<>stand and stand pivot transfers throughout session with STEDY and requiring ModA with significant pull of BUE from lower seat heights. Can perform with CGA from perch seat. Requires vc for slow control throughout especially for descending to sit.   Wheelchair Mobility:  Pt propelled wheelchair 10 feet with supervision using BUE only. [name] require lowering of w/c for foot assist in propulsion. Attempts to propel up ramp but requires MaxA to perform. Is able to hold position on ramp but difficult to propel.   Therapeutic Exercise: Pt performed the following exercises with vc/ tc for proper technique and  educated/ cued for linking TA/ abdominal activation for setup to exercise and with exhale during efforts.  Seated toe touches to 8" step Seated marches 2x10 with 1.5# aw Seated LAQs 2x 10 with 1.5# aw Ankle pumps  Educated in supine straight leg abd/ add, raises (even if just performs isometrically), heel slides progressing into LE raise to 90/90.   Patient supine in bed at end of session with brakes locked, bed alarm set, and all needs within reach.   Therapy Documentation Precautions:  Precautions Precautions: Fall, Back Precaution Comments: spinal Restrictions Weight Bearing Restrictions: No  Pain: Pain Assessment Pain Scale: Faces Faces Pain Scale: Hurts a little bit Pain Type: Acute pain;Surgical pain;Chronic pain Pain Location: Back Pain Orientation: Mid Pain Descriptors / Indicators: Aching;Throbbing Pain Onset: On-going Pain Intervention(s): Repositioned  Therapy/Group: Individual Therapy  Loel Dubonnet PT, DPT, CSRS 03/24/2023, 6:03 PM

## 2023-03-25 NOTE — Patient Care Conference (Signed)
Inpatient RehabilitationTeam Conference and Plan of Care Update Date: 03/25/2023   Time: 11:32 AM    Patient Name: Raymond [name]      Medical Record Number: [id number]  Date of Birth: 1965-07-10 Sex: Male         Room/Bed: 4W03C/4W03C-01 Payor Info: Payor: [employer] / Plan: CIGNA MANAGED / Product Type: *No Product type* /    Admit Date/Time:  03/23/2023  7:24 PM  Primary Diagnosis:  Thoracic myelopathy  Hospital Problems: Principal Problem:   Thoracic myelopathy    Expected Discharge Date: Expected Discharge Date: 04/08/23  Team Members Present: Physician leading conference: Dr. Sula Soda Social Worker Present: Lavera Guise, BSW Nurse Present: Vedia Pereyra, RN PT Present: Raechel Chute, PT OT Present: Candee Furbish, OT PPS Coordinator present : Fae Pippin, SLP     Current Status/Progress Goal Weekly Team Focus  Bowel/Bladder   Continent of B/B. LBM 1s 03/24/23  *** Remain continent.   Assist with BRP    Swallow/Nutrition/ Hydration      ***         ADL's   Min A UB, Total A LB, +2 for toileting  *** Min A   Barriers- body habitus, poor adherence to spinal precautions, pain, B&B management, caregiver burden, standing balance    Mobility   Bed mobility = MinA, Slide board transfers = minA +2, standing transfers require STEDY with pull to stand and ModA (extreme effort), w/c with TotA for long distances, SUP for in-room distances.  *** Mod I/ sup for bed mobility, sup/ CGA for slide board/ squat pivot?, MaxA for stand, sup to MinA for w/c mobility  Barriers: BLE weakness, inability to maintain spinal precautions, body habitus/ weight, knee OA (L>R) :::Work on: general strengthening, rising to stand and standing tolerance, transfers, activity tolerance, w/c mobility    Communication      ***          Safety/Cognition/ Behavioral Observations     ***          Pain   Genralized pain  *** <3/10. Prn meds at needed.   Assess Q$ and prn     Skin   Surgical incision to back. Skin glue. OTA  *** Remain free of infection.  Assess QS and prn.      Discharge Planning:  Discharging home with spouse to assist, step daughter lives next dooe if requirng additional assistance. WC level since July and SNF admission in August. Barrier: insurance.   Team Discussion: *** Patient on target to meet rehab goals: {IP REHAB YES/NO WITH YQMVHQION:62952}  *See Care Plan and progress notes for long and short-term goals.   Revisions to Treatment Plan:  ***  Teaching Needs: ***  Current Barriers to Discharge: {BARRIERS TO WUXLKGMWN:02725}  Possible Resolutions to Barriers: ***     Medical Summary Current Status: constipation, acute on chronic back pain, morbid obesity, type 2 diabetes, thoracic myelopathy  Barriers to Discharge: Medical stability  Barriers to Discharge Comments: constipation, acute on chronic back pain, morbid obesity type 2 diabetes, thoracic myelopathy Possible Resolutions to [employer] Focus: continue bowel program, continue pain medication, provided dietary education, continue metformin, educate regarding spinal precautions   Continued Need for Acute Rehabilitation Level of Care: The patient requires daily medical management by a physician with specialized training in physical medicine and rehabilitation for the following reasons: Direction of a multidisciplinary physical rehabilitation program to maximize functional independence : Yes Medical management of patient stability for increased activity during participation in an intensive  rehabilitation regime.: Yes Analysis of laboratory values and/or radiology reports with any subsequent need for medication adjustment and/or medical intervention. : Yes   I attest that I was present, lead the team conference, and concur with the assessment and plan of the team.   Jearld Adjutant 03/25/2023, 12:00 PM

## 2023-03-25 NOTE — Evaluation (Addendum)
Physical Therapy Assessment and Plan  Patient Details  Name: Raymond [name] MRN: [id number] Date of Birth: [birth year]  PT Diagnosis: Abnormal posture, Abnormality of gait, Difficulty walking, Impaired cognition, Impaired sensation, Low back pain, Muscle weakness, Osteoarthritis, Paraplegia, and Pain in knees, mid back Rehab Potential: Fair ELOS: 21-24 days   Today's Date: 03/24/2023 PT Individual Time:  1101-1206  PT Individual Time Calculation (min): 65 min  Hospital Problem: Principal Problem:   Thoracic myelopathy   Past Medical History:  Past Medical History:  Diagnosis Date   Anxiety    COPD (chronic obstructive pulmonary disease) (HCC)    Diabetes mellitus without complication (HCC)    Diverticulosis    with hx of LGIB   Edema    GERD (gastroesophageal reflux disease)    GIB (gastrointestinal bleeding)    Gout    Heavy smoker    Hyperlipidemia    Hypertension    Iron deficiency anemia    Sleep apnea    uses a cpap   Past Surgical History:  Past Surgical History:  Procedure Laterality Date   CHONDROPLASTY Left 06/28/2014   Procedure: CHONDROPLASTY;  Surgeon: Thera Flake., MD;  Location: [facility];  Service: Orthopedics;  Laterality: Left;   COLONOSCOPY     FOOT FASCIOTOMY     age 87-rt   KNEE ARTHROSCOPY WITH LATERAL MENISECTOMY Left 06/28/2014   Procedure: KNEE ARTHROSCOPY WITH LATERAL MENISECTOMY;  Surgeon: Thera Flake., MD;  Location: [facility];  Service: Orthopedics;  Laterality: Left;   KNEE ARTHROSCOPY WITH MEDIAL MENISECTOMY Left 06/28/2014   Procedure: LEFT KNEE ARTHROSCOPY CHONDROPLASTY/WITH MEDIAL/LATERAL MENISECTOMIES;  Surgeon: Thera Flake., MD;  Location: [facility];  Service: Orthopedics;  Laterality: Left;   ORIF RADIUS & ULNA FRACTURES  2007   left   THORACIC DISCECTOMY N/A 03/12/2023   Procedure: THORACIC LAMINECTOMY AND  DISCECTOMY;  Surgeon: Coletta Memos, MD;  Location: [facility];  Service:  Neurosurgery;  Laterality: N/A;    Assessment & Plan Clinical Impression: Patient is a 57 y.o. male with history of COPD, T2DM, OSA, tobacco use, fibula fx/FTT 01/2023, CKD 111a, morbid obesity who was admitted on 03/11/23 with BLE weakness with gait disorder, urinary incontinence and decreased coordination BLE. He ws found to have large HNP at T10/T11 and underwent T10 laminectomy with diskectomy by Dr. Franky Macho on 09/10. Post op he had issues with poor pain control and has been using IV dilaudid with oxycodone prn. Foley placed in ED and remains in place. Po intake has been good but has not had BM for 3 days and usually goes daily. Has been refusing CPAP but compliant at home. Sensation bilateral feet and ankle are improving. PT/OT has been working with patient who requires Mod assist with Stedy to stand and min assist with ADLs. He was independent and working prior to this summer. CIR recommended due to functional decline.  Patient transferred to CIR on 03/23/2023 .   Patient currently requires max assist with mobility secondary to muscle weakness and muscle paralysis, decreased cardiorespiratoy endurance, unbalanced muscle activation, ,, decreased awareness, decreased problem solving, and decreased safety awareness, and decreased sitting balance, decreased standing balance, decreased postural control, and difficulty maintaining precautions.  Prior to hospitalization, patient was min assist with mobility at wheelchair level and lived with Spouse in a House home.  Home access is  Ramped entrance.  Patient will benefit from skilled PT intervention to maximize safe functional mobility, minimize fall risk, and  decrease caregiver burden for planned discharge home with 24 hour assist.  Anticipate patient will benefit from follow up HH at discharge.  PT - End of Session Activity Tolerance: Tolerates 30+ min activity with multiple rests Endurance Deficit: Yes PT Assessment Rehab Potential (ACUTE/IP ONLY):  Fair PT Barriers to Discharge: Decreased caregiver support;Neurogenic Bowel & Bladder;Wound Care;Lack of/limited family support;Insurance for SNF coverage;Weight PT Patient demonstrates impairments in the following area(s): Balance;Endurance;Motor;Nutrition;Pain;Safety;Sensory;Skin Integrity PT Transfers Functional Problem(s): Bed Mobility;Bed to Chair;Car;Furniture PT Locomotion Functional Problem(s): Ambulation;Wheelchair Mobility;Stairs PT Plan PT Intensity: Minimum of 1-2 x/day ,45 to 90 minutes PT Frequency: 5 out of 7 days PT Duration Estimated Length of Stay: 21-24 days PT Treatment/Interventions: Ambulation/gait training;Community reintegration;DME/adaptive equipment instruction;Neuromuscular re-education;Psychosocial support;Stair training;UE/LE Strength taining/ROM;Wheelchair propulsion/positioning;Balance/vestibular training;Discharge planning;Functional electrical stimulation;Pain management;Skin care/wound management;Therapeutic Activities;UE/LE Coordination activities;Cognitive remediation/compensation;Disease management/prevention;Functional mobility training;Patient/family education;Splinting/orthotics;Therapeutic Exercise;Visual/perceptual remediation/compensation PT Transfers Anticipated Outcome(s): CGA/ supervision for slide board PT Locomotion Anticipated Outcome(s): supervision with w/c PT Recommendation Recommendations for Other Services: Therapeutic Recreation consult Therapeutic Recreation Interventions: Pet therapy;Kitchen group;Stress management;Outing/community reintergration Follow Up Recommendations: Home health PT;24 hour supervision/assistance;Outpatient PT Patient destination: Home Equipment Recommended: To be determined   PT Evaluation Precautions/Restrictions Precautions Precautions: Fall;Back Restrictions Weight Bearing Restrictions: No General   Vital Signs  Pain Pain Assessment Pain Scale: Faces Faces Pain Scale: Hurts a little bit Pain Type: Acute  pain;Surgical pain;Chronic pain Pain Location: Back Pain Orientation: Mid Pain Descriptors / Indicators: Aching;Throbbing Pain Onset: On-going Pain Intervention(s): Repositioned Pain Interference Pain Interference Pain Effect on Sleep: 2. Occasionally Pain Interference with Therapy Activities: 2. Occasionally Pain Interference with Day-to-Day Activities: 2. Occasionally Home Living/Prior Functioning Home Living Available Help at Discharge: Family;Neighbor Type of Home: House Home Access: Ramped entrance Home Layout: One level Bathroom Shower/Tub: Engineer, manufacturing systems: Handicapped height (1 elevated, 1 standard) Bathroom Accessibility: No (w/c will not fit thru door, rollator will fit) Additional Comments: pt has been limited to [facility] mobility since hospital admission in July and dc'd to August to SNF for 2 weeks of rehab. Since returning home pt has worked with [facility] therapy on transfers, has been able to stand for ~3 mins on some visits with HHPT. Patient naps in recliner but sleeps in regular flat bed, uses CPAP when sleeping. Sunday of fall pt was trying to stand alone while spouse out of home.  Lives With: Spouse Prior Function Level of Independence: Needs assistance with ADLs;Needs assistance with homemaking;Independent with transfers  Able to Take Stairs?: No Driving: No Vocation: Retired Gaffer: retired Marketing executive - History Ability to See in Adequate Light: 0 Adequate Perception Perception: Within Systems developer Praxis Praxis: WFL  Cognition Overall Cognitive Status: Within Functional Limits for tasks assessed Arousal/Alertness: Awake/alert Orientation Level: Oriented X4 Memory: Impaired Awareness: Impaired Problem Solving: Impaired Safety/Judgment: Impaired Sensation Sensation Light Touch: Impaired by gross assessment (reports reduced sensation in R anterolateral thigh with numbess) Coordination Gross Motor  Movements are Fluid and Coordinated: No Fine Motor Movements are Fluid and Coordinated: No Motor  Motor Motor: Abnormal postural alignment and control;Paraplegia Motor - Skilled Clinical Observations: significant B LE weakness with poor ability to support weight during sit to stand attempts   Trunk/Postural Assessment  Cervical Assessment Cervical Assessment: Exceptions to [facility] Thoracic Assessment Thoracic Assessment: Exceptions to [facility] (rounded shoulders with mild kyphosis) Lumbar Assessment Lumbar Assessment: Exceptions to [facility] (reduced strength with pt preferring posterior pelvic tilt in sitting but able to sit unsupported with good lordotic posture) Postural Control Postural Control: Deficits on evaluation  Protective Responses: diminished  Balance Balance Balance Assessed: Yes Static Sitting Balance Static Sitting - Balance Support: Feet supported Static Sitting - Level of Assistance: Other (comment) (CGA for maintaining back precautions) Dynamic Sitting Balance Dynamic Sitting - Balance Support: Feet supported Dynamic Sitting - Level of Assistance: 4: Min assist (physical assist to maintain back precautions) Extremity Assessment      RLE Assessment RLE Assessment: Exceptions to [facility] Strength Comments: R weaker than L with significant weakness with weight bearing RLE Strength RLE Overall Strength: Deficits RLE Overall Strength Comments: MMT in seated (open chain) positioning Right Hip Flexion: 3/5 (posterior trunk compensation) Right Hip Extension: 3-/5 Right Hip ABduction: 4-/5 Right Hip ADduction: 4/5 Right Knee Flexion: 3+/5 Right Knee Extension: 4-/5 Right Ankle Dorsiflexion: 3-/5 Right Ankle Plantar Flexion: 3+/5 LLE Assessment LLE Assessment: Exceptions to WFL LLE Strength LLE Overall Strength: Deficits Left Hip Flexion: 4/5 Left Hip Extension: 4/5 Left Hip ABduction: 4+/5 Left Hip ADduction: 4+/5 Left Knee Flexion: 4-/5 Left Knee Extension:  4+/5 Left Ankle Dorsiflexion: 4+/5 Left Ankle Plantar Flexion: 4+/5  Care Tool Care Tool Bed Mobility Roll left and right activity   Roll left and right assist level: Minimal Assistance - Patient > 75%    Sit to lying activity   Sit to lying assist level: Minimal Assistance - Patient > 75%    Lying to sitting on side of bed activity   Lying to sitting on side of bed assist level: the ability to move from lying on the back to sitting on the side of the bed with no back support.: Minimal Assistance - Patient > 75%     Care Tool Transfers Sit to stand transfer   Sit to stand assist level: 2 Helpers    Chair/bed transfer   Chair/bed transfer assist level: 2 Web designer transfer activity did not occur: Safety/medical concerns        Care Tool Locomotion Ambulation Ambulation activity did not occur: Safety/medical concerns        Walk 10 feet activity Walk 10 feet activity did not occur: Safety/medical concerns       Walk 50 feet with 2 turns activity Walk 50 feet with 2 turns activity did not occur: Safety/medical concerns      Walk 150 feet activity Walk 150 feet activity did not occur: Safety/medical concerns      Walk 10 feet on uneven surfaces activity Walk 10 feet on uneven surfaces activity did not occur: Safety/medical concerns      Stairs Stair activity did not occur: Safety/medical concerns        Walk up/down 1 step activity Walk up/down 1 step or curb (drop down) activity did not occur: Safety/medical concerns      Walk up/down 4 steps activity Walk up/down 4 steps activity did not occur: Safety/medical concerns      Walk up/down 12 steps activity Walk up/down 12 steps activity did not occur: Safety/medical concerns      Pick up small objects from floor Pick up small object from the floor (from standing position) activity did not occur: Safety/medical concerns      Wheelchair Is the patient using a wheelchair?: Yes Type of  Wheelchair: Manual   Wheelchair assist level: Total Assistance - Patient < 25% Max wheelchair distance: 200 ft  Wheel 50 feet with 2 turns activity   Assist Level: Total Assistance - Patient < 25%  Wheel 150 feet activity   Assist Level: Total Assistance - Patient <  25%    Refer to Care Plan for Long Term Goals  SHORT TERM GOAL WEEK 1 PT Short Term Goal 1 (Week 1): Pt will perform bed mobility with consistent MinA and abiding by spinal precautions. PT Short Term Goal 2 (Week 1): Pt will perform sit<>stand from elevated height with knee guard and ModA +2 PT Short Term Goal 3 (Week 1): Pt will perform slide board transfers in either direction with MinA/ CGA. PT Short Term Goal 4 (Week 1): Pt will propel w/c >25 ft with MinA.  Recommendations for other services: Therapeutic Recreation  Pet therapy, Kitchen group, Stress management, and Outing/community reintegration  Skilled Therapeutic Intervention Mobility Bed Mobility Bed Mobility: Rolling Right;Rolling Left;Supine to Sit Rolling Right: Minimal Assistance - Patient > 75% (requires intervention to abide by back precautions) Rolling Left: Minimal Assistance - Patient > 75% Supine to Sit: Minimal Assistance - Patient > 75% Transfers Transfers: Mudlogger;Sit to Stand Sit to Stand: Total Assistance - Patient < 25%;2 Helpers Stand Pivot Transfers: Dependent - mechanical lift Transfer via Lift Equipment: Stedy Locomotion  Gait Ambulation: No Gait Gait: No Stairs / Additional Locomotion Stairs: No Ramp: Total Assistance - Patient <25% Curb: Dependent - Patient 0% Naval architect Mobility: Yes Wheelchair Assistance: Maximal Assistance - Patient 25 - 49% Wheelchair Propulsion: Both upper extremities Wheelchair Parts Management: Needs assistance Distance: 10 ft  Skilled Intervention: PT Evaluation completed; see above for results. PT educated patient in roles of PT vs OT, PT POC, rehab potential,  rehab goals, and discharge recommendations along with recommendation for follow-up rehabilitation services. Individual treatment initiated:  Patient seated upright in w/c upon PT arrival. Patient alert and agreeable to PT session. Wife present. No pain complaint at start of session.  Therapeutic Activity: Transfers: Attempted sit<>stand at RW but BLE with reduced strength in WB compared to MMT and unable to push pt's body weight up into stance. Pt slides forward in w/c and holds position with BUE and knee block from therapist. MaxA +2 required to scoot back in w/c.   Pt is strong in BUE and is able to rise to stand with significant effort in STEDY, holding breath and ModA. Educated not to hold breath throughout. Rises from perch seat of STEDY with minA/ CGA and continued cueing for breathing throughout as well as increasing BLE use.   Bed Mobility: Patient with difficulty staying in sidelying and despite cues for sidelying technique to maintain spinal precautions, prefers to roll to supine and requires ModA for BLE to bed surface.  Provided verbal cues for technique.  Patient supine in bed at end of session with brakes locked, bed alarm set, and all needs within reach. Positioned upright for lunch arrival. Wife present.    Discharge Criteria: Patient will be discharged from PT if patient refuses treatment 3 consecutive times without medical reason, if treatment goals not met, if there is a change in medical status, if patient makes no progress towards goals or if patient is discharged from hospital.  The above assessment, treatment plan, treatment alternatives and goals were discussed and mutually agreed upon: by patient and by family  Loel Dubonnet PT, DPT, CSRS 03/24/2023, 5:20 PM

## 2023-03-25 NOTE — Plan of Care (Signed)
  Problem: RH Balance Goal: LTG Patient will maintain dynamic sitting balance (PT) Description: LTG:  Patient will maintain dynamic sitting balance with assistance during mobility activities (PT) Flowsheets (Taken 03/24/2023 1653) LTG: Pt will maintain dynamic sitting balance during mobility activities with:: Independent with assistive device  Goal: LTG Patient will maintain dynamic standing balance (PT) Description: LTG:  Patient will maintain dynamic standing balance with assistance during mobility activities (PT) Flowsheets (Taken 03/24/2023 1653) LTG: Pt will maintain dynamic standing balance during mobility activities with:: Moderate Assistance - Patient 50 - 74%   Problem: RH Bed Mobility Goal: LTG Patient will perform bed mobility with assist (PT) Description: LTG: Patient will perform bed mobility with assistance, with/without cues (PT). Flowsheets (Taken 03/24/2023 1653) LTG: Pt will perform bed mobility with assistance level of: Independent with assistive device    Problem: RH Bed to Chair Transfers Goal: LTG Patient will perform bed/chair transfers w/assist (PT) Description: LTG: Patient will perform bed to chair transfers with assistance (PT). Flowsheets (Taken 03/24/2023 1653) LTG: Pt will perform Bed to Chair Transfers with assistance level: Supervision/Verbal cueing Note: Using slide board or performing squat pivot   Problem: RH Car Transfers Goal: LTG Patient will perform car transfers with assist (PT) Description: LTG: Patient will perform car transfers with assistance (PT). Flowsheets (Taken 03/24/2023 1653) LTG: Pt will perform car transfers with assist:: Contact Guard/Touching assist Note: Using slide board   Problem: RH Ambulation Goal: LTG Patient will ambulate in controlled environment (PT) Description: LTG: Patient will ambulate in a controlled environment, # of feet with assistance (PT). Flowsheets (Taken 03/24/2023 1653) LTG: Pt will ambulate in controlled  environ  assist needed:: Maximal Assistance - Patient 25 - 49% LTG: Ambulation distance in controlled environment: at least 10 ft using LRAD   Problem: RH Wheelchair Mobility Goal: LTG Patient will propel w/c in controlled environment (PT) Description: LTG: Patient will propel wheelchair in controlled environment, # of feet with assist (PT) Flowsheets (Taken 03/24/2023 1653) LTG: Pt will propel w/c in controlled environ  assist needed:: Supervision/Verbal cueing LTG: Propel w/c distance in controlled environment: at least 100 ft Goal: LTG Patient will propel w/c in home environment (PT) Description: LTG: Patient will propel wheelchair in home environment, # of feet with assistance (PT). Flowsheets (Taken 03/24/2023 1653) LTG: Pt will propel w/c in home environ  assist needed:: Supervision/Verbal cueing Distance: wheelchair distance in controlled environment: 100 LTG: Propel w/c distance in home environment: at least 50 ft including fwd/ bkwd, manuevering all turns and obstacles

## 2023-03-25 NOTE — TOC Benefit Eligibility Note (Signed)
Patient Product/process development scientist completed.    The patient is insured through  [employer] . Patient has [employer] [place] use a copay card, and/or apply for patient assistance if available.    Ran test claim for Eliquis 5 mg and the current 30 day co-pay is $115.73.  Ran test claim for Eliquis Statrer Pack and Not Covered   This test claim was processed through [employer]- copay amounts Gin vary at other pharmacies due to [employer], or as the patient moves through the different stages of their insurance plan.     Roland Earl, CPHT Pharmacy Technician III Certified Patient Advocate [facility] Pharm[facility] Patient Advocate Team Direct Number: [phone]  Fax: [phone]

## 2023-03-25 NOTE — Progress Notes (Signed)
PROGRESS NOTE   Subjective/Complaints: Cr improved but still elevated, weight is down, will decrease Lasix to 40mg  BID, add on magnesium level Knees buckling with therapy  ROS: +impaired ambulation, +chronic back pain   Objective:   DG Chest 2 View  Result Date: 03/24/2023 CLINICAL DATA:  Left-sided chest wall pain. EXAM: CHEST - 2 VIEW COMPARISON:  07/09/2021. FINDINGS: The heart is enlarged and mediastinal contours are within normal limits. Pulmonary vasculature is mildly distended. No consolidation, effusion, or pneumothorax. No acute osseous abnormality. IMPRESSION: Cardiomegaly with mildly distended pulmonary vasculature. Electronically Signed   By: Thornell Sartorius M.D.   On: 03/24/2023 00:42   Recent Labs    03/24/23 0730  WBC 12.4*  HGB 11.2*  HCT 34.7*  PLT 350   Recent Labs    03/24/23 0730 03/25/23 0454  NA 139 134*  K 4.6 3.6  CL 96* 98  CO2 27 27  GLUCOSE 104* 99  BUN 33* 37*  CREATININE 2.09* 2.07*  CALCIUM 10.3 9.6    Intake/Output Summary (Last 24 hours) at 03/25/2023 1130 Last data filed at 03/25/2023 0800 Gross per 24 hour  Intake 820 ml  Output 1400 ml  Net -580 ml        Physical Exam: Vital Signs Blood pressure 134/72, pulse 73, temperature 97.7 F (36.5 C), resp. rate 16, height 5\' 11"  (1.803 m), weight (!) 144.6 kg, SpO2 94%. Gen: no distress, normal appearing HEENT: oral mucosa pink and moist, NCAT Cardio: Reg rate Chest:     Chest wall: Tenderness (left lateral chest) present.  Abdominal:     General: Bowel sounds are normal. There is no distension.     Palpations: Abdomen is soft.     Tenderness: There is no abdominal tenderness.  Musculoskeletal:        General: Tenderness (back) present. Normal range of motion.     Cervical back: Normal range of motion.     Right lower leg: Edema (trace) present.     Left lower leg: Left lower leg edema: trace.  Skin:    General: Skin  is warm and dry.     Comments: Back incision C/D/Intact. Dry scab on left shin. Stasis changes BLE.   Neurological:     Mental Status: He is alert.     Comments: Alert and oriented x 3. Normal insight and awareness. Intact Memory. Normal language and speech. Cranial nerve exam unremarkable. MMT: UE 5/5 bilaterally. RLE 3+ to 4-/5 HF, KE and 4/5 ADF/PF.  LLE 4/5 HF,KE, 4+ADF/PF. Pt senses pain and light touch equally on all 4's.  DTR's 3+ in both LE's. 3-4 beats of clonus each foot. Mild extensor tone in LE's, knees buckling on standing attempts, exam stable 10/23 Psychiatric:        Mood and Affect: Mood normal.        Behavior: Behavior normal.        Thought Content: Thought content normal.    Assessment/Plan: 1. Functional deficits which require 3+ hours per day of interdisciplinary therapy in a comprehensive inpatient rehab setting. Physiatrist is providing close team supervision and 24 hour management of active medical problems listed below. Physiatrist and rehab team continue  to assess barriers to discharge/monitor patient progress toward functional and medical goals  Care Tool:  Bathing    Body parts bathed by patient: Right arm, Left arm, Chest, Abdomen, Face   Body parts bathed by helper: Front perineal area, Buttocks, Right upper leg, Left upper leg, Right lower leg, Left lower leg     Bathing assist Assist Level: Total Assistance - Patient < 25%     Upper Body Dressing/Undressing Upper body dressing   What is the patient wearing?: Pull over shirt    Upper body assist Assist Level: Minimal Assistance - Patient > 75%    Lower Body Dressing/Undressing Lower body dressing      What is the patient wearing?: Pants     Lower body assist Assist for lower body dressing: Total Assistance - Patient < 25%     Toileting Toileting    Toileting assist Assist for toileting: 2 Helpers     Transfers Chair/bed transfer  Transfers assist     Chair/bed transfer assist  level: 2 Helpers     Locomotion Ambulation   Ambulation assist   Ambulation activity did not occur: Safety/medical concerns          Walk 10 feet activity   Assist  Walk 10 feet activity did not occur: Safety/medical concerns        Walk 50 feet activity   Assist Walk 50 feet with 2 turns activity did not occur: Safety/medical concerns         Walk 150 feet activity   Assist Walk 150 feet activity did not occur: Safety/medical concerns         Walk 10 feet on uneven surface  activity   Assist Walk 10 feet on uneven surfaces activity did not occur: Safety/medical concerns         Wheelchair     Assist Is the patient using a wheelchair?: Yes Type of Wheelchair: Manual    Wheelchair assist level: Supervision/Verbal cueing Max wheelchair distance: 161ft    Wheelchair 50 feet with 2 turns activity    Assist        Assist Level: Supervision/Verbal cueing   Wheelchair 150 feet activity     Assist      Assist Level: Supervision/Verbal cueing   Blood pressure 134/72, pulse 73, temperature 97.7 F (36.5 C), resp. rate 16, height 5\' 11"  (1.803 m), weight (!) 144.6 kg, SpO2 94%.  Medical Problem List and Plan: 1. 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 Pt s/p T10 laminectomy and discectomy on 03/12/23.              -patient Grilli shower             -ELOS/Goals: 17-24 days, goals supervision to min assist at w/c level             -Bilateral PRAFO's  Continue CIR  Grounds pass ordered 2.  Antithrombotics: -DVT/anticoagulation:  Pharmaceutical: Change heparin to Lovenox.  Will check surveillance dopplers due to immobility.              -antiplatelet therapy: N/A 3. Pain Management: Oxycodone prn--> has been using IV dilaudid couple of times a day             --He feels that dilaudid works better but oxycodone last longer. Will schedule oxycodone 10 mg TID for now              --Wean down as  pain/activity improves.    4. Mood/Behavior/Sleep:  LCSW to follow for evaluation and support.              -antipsychotic agents: N/A 5. Neuropsych/cognition: This patient is capable of making decisions on his own behalf. 6. Skin/Wound Care: back incision is CDI. [place] be left open to air  7. Fluids/Electrolytes/Nutrition: Monitor I/O. Check CMET in am  8. Spasticity: slightly improved with addition of low dose baclofen last week. Increase baclofen to 5mg  TID for now 9. HTN: Monitor BS TID--continue Amlodipine,  Coreg, Cardura and Lasix bid             --labile with SBP ranging 130-160 during the day. Monitor for orthostatic symptoms.  10.  OSA: CPAP at nights-->Is compliant with his machine and advised him to ask his wife to bring it from home. 11. T2DM: Hgb A1C- 6.0 and well controlled.  D/c CBGs and insulin.  BS stable on glucotrol and metformin daily in am.  --monitor for changes with increase in activity.  10. COPD: Has intermittent cough " from 40 yrs of smoking"             --resume MDI. 11. Hypokalemia: Likely due to diuretic/intake. Will continue K dur  30 meq BID --K- 3.3 on 10/09 (admission)-->recheck BMET in am 12. CKD: Baseline SCr around 1.3--currently 2.09, encouraged 6-8 glasses of water daily and repeat BMP tomorrow. Advised not to use Goodys or Mobic.  Lasix decreased to 40mg  BID given AKI and lower weights  13. Leucocytosis: Monitor for fevers and other signs of infection. WBC was up to 13.3 after surgery.             --recheck WBC in am 14. Acute on chronic anemia: Recheck H/H in am 15. Constipation/neurogenic bowel.  Last BM documented on 10/18. -- On Senna 1 bid--> change to 2 in am. Sorbitol 45 cc tonight. -dulcolax supp in pm -probably doesn't need a bowel program  16.  Neurogenic bladder: Continue Flomax and Cardura. Foley was placed at admission             -continue foley tonight.  -Voiding trial in AM  17. Morbid obesity: BMI -44. Was 317 at admission and  down to 315 lbs. He would like to lose more weight.  --Will need ongoing education on diet as well as importance of weight loss to promote mobility and wound healing  18.  Anxiety d/o: On Cymbalta with prn Xanax. Add on magnesium level today  19.  Left chest wall/rib pain: Has been ongoing since 4 days and feels that is positional. Lidocaine patches at night as pain worse then.  CXR reviewed and shows no fracture/infection  20. Hypoalbuminemia: encouraged prioritizing protein in diet  21. Dysuria: UA/UC ordered    LOS: 2 days A FACE TO FACE EVALUATION WAS PERFORMED  Adeja Sarratt P Jaline Pincock 03/25/2023, 11:30 AM

## 2023-03-25 NOTE — Progress Notes (Signed)
Physical Therapy Session Note  Patient Details  Name: Raymond [name] MRN: [id number] Date of Birth: [birth year]  Today's Date: 03/25/2023 PT Individual Time: [phone] and 1330-1456 PT Individual Time Calculation (min): 71 min and 26 min  Short Term Goals: Week 1:  PT Short Term Goal 1 (Week 1): Pt will perform bed mobility with consistent MinA and abiding by spinal precautions. PT Short Term Goal 2 (Week 1): Pt will perform sit<>stand from elevated height with knee guard and ModA +2 PT Short Term Goal 3 (Week 1): Pt will perform slide board transfers in either direction with MinA/ CGA. PT Short Term Goal 4 (Week 1): Pt will propel w/c >25 ft with MinA.  Skilled Therapeutic Interventions/Progress Updates:  Ambulation/gait training;Community reintegration;DME/adaptive equipment instruction;Neuromuscular re-education;Psychosocial support;Stair training;UE/LE Strength taining/ROM;Wheelchair propulsion/positioning;Balance/vestibular training;Discharge planning;Functional electrical stimulation;Pain management;Skin care/wound management;Therapeutic Activities;UE/LE Coordination activities;Cognitive remediation/compensation;Disease management/prevention;Functional mobility training;Patient/family education;Splinting/orthotics;Therapeutic Exercise;Visual/perceptual remediation/compensation   Treatment Session 1 Received pt semi-reclined in bed, pt agreeable to PT treatment, and denied any pain during session. Session with emphasis on functional mobility/transfers, dressing, generalized strengthening and endurance, and standing tolerance. Donned knee high teds with total A and pt transferred semi-reclined<>sitting EOB with HOB elevated and use of bedrails with supervision with cues to avoid twisting. Doffed scrub pants with mod A via lateral leans and pt washed front of body with set up assist but dependent to wash back due to spinal precautions.  Donned shorts with total A to avoid bending forward but able  to don clean shirt with supervision and slipped on shoes with min A. Pt stood from elevated EOB in [place] with mod A +2 and dependent to pull shorts over hips. Transferred to WC in [place] dependently, stood from [place] flaps with light mod A +2, and transitioned from stand<>sit with mod A of 1 for eccentric descent. Of note, pt with multiple episodes of bilateral knee buckling when standing/sitting. Pt sat in WC at sink and shaved, brushed teeth/washed face, and combed hair with set up assist. Pt performed WC mobility 144ft using BUE and supervision and increased time to dayroom with emphasis on UE strength - rest break halfway for RN to remove IV. Pt stood x 4 trials in [place] with mod A +2 fading to max A +2 with fatigue. While standing, picked up clothespins and clipped to clothesline alternating using RUE and LUE - pt buckling each time when letting go with 1UE (LLE>RLE) and required extended rest breaks in between each standing trial. MD arrived for morning rounds, then pt performed the following seated exercises with emphasis on LE strength/ROM: -LAQ with 1lb ankle weight 2x10 bilaterally -hip flexion with 1lb ankle weight 2x10 bilaterally (decreased ROM) Concluded session with pt sitting in WC, needs within reach, and seatbelt alarm on.   Treatment Session 2 Received pt semi-reclined in bed asleep. Upon wakening, pt agreeable to PT treatment and denied any pain during session. Session with emphasis on functional mobility/transfers, generalized strengthening and endurance, and standing tolerance. Pt transferred semi-reclined<>sitting L EOB with HOB elevated and use of bedrails with supervision and cues for logroll technique. Donned slip on shoes with min A. Stood from elevated EOB in [place] x 2 reps with max A of 1 fading to max A of 2 with fatigue - pt performed x1 rep alternating march but experienced buckling episode and landed on [place] flaps. Stood from [employer] flaps x 2 trials with heavy max A of 1 and  transferred to [facility] in [place] dependently. Pt required extended rest breaks in  between each standing bout and remains motivated throughout sessions with goal to be ambulatory again. Concluded session with pt sitting in WC, needs within reach, and seatbelt alarm on.   Therapy Documentation Precautions:  Precautions Precautions: Fall, Back Precaution Comments: spinal Restrictions Weight Bearing Restrictions: No   Therapy/Group: Individual Therapy [name] Blima Rich PT, DPT 03/25/2023, 6:54 AM

## 2023-03-25 NOTE — Progress Notes (Signed)
Patient had a quite night. Had bowel program yesterday, and was successful. Requested pain medication this morning for back ache. In good spirits. Awaiting therapy session this morning. Safety maintained.

## 2023-03-25 NOTE — Progress Notes (Signed)
PHARMACY - ANTICOAGULATION CONSULT NOTE  Pharmacy Consult for apixaban Indication: new DVT  Allergies  Allergen Reactions   Sulfa Antibiotics Hives    Patient Measurements: Height: 5\' 11"  (180.3 cm) Weight: (!) 144.6 kg (318 lb 12.6 oz) IBW/kg (Calculated) : 75.3  Vital Signs: Temp: 97.7 F (36.5 C) (10/23 0509) BP: 134/72 (10/23 0509) Pulse Rate: 73 (10/23 0509)  Labs: Recent Labs    03/24/23 0730 03/25/23 0454  HGB 11.2*  --   HCT 34.7*  --   PLT 350  --   CREATININE 2.09* 2.07*    Estimated Creatinine Clearance: 56 mL/min (A) (by C-G formula based on SCr of 2.07 mg/dL (H)).   Medical History: Past Medical History:  Diagnosis Date   Anxiety    COPD (chronic obstructive pulmonary disease) (HCC)    Diabetes mellitus without complication (HCC)    Diverticulosis    with hx of LGIB   Edema    GERD (gastroesophageal reflux disease)    GIB (gastrointestinal bleeding)    Gout    Heavy smoker    Hyperlipidemia    Hypertension    Iron deficiency anemia    Sleep apnea    uses a cpap    Assessment: 57 year old male not on anticoagulation PTA who was admitted on 10/09 with BLE weakness with gait disorder, urinary incontinence and decreased coordination BLE found to have large HNP at T10/T11 and underwent T10 laminectomy with diskectomy by Dr. Franky Macho on 09/10. Discharged to CIR. US doppler consistent with acute deep vein thrombosis involving the right peroneal veins and an acute intramuscular thrombosis involving the right gastrocnemius veins. Pharmacy consulted to start apixaban.  Goal of Therapy:  Monitor platelets by anticoagulation protocol: Yes   Plan:  Apixaban 10 mg twice daily x7 days followed by apixaban 5 mg twice daily thereafter Continue to monitor H&H and platelets   Thank you for allowing pharmacy to be a part of this patient's care.  Thelma Barge, PharmD Clinical Pharmacist

## 2023-03-25 NOTE — Progress Notes (Signed)
Bilateral lower extremity venous duplex has been completed. Preliminary results can be found in CV Proc through chart review.  Results were given to Delle Reining PA.  03/25/23 1:35 PM Olen Cordial RVT

## 2023-03-26 ENCOUNTER — Inpatient Hospital Stay (HOSPITAL_COMMUNITY): Payer: Managed Care, Other (non HMO)

## 2023-03-26 DIAGNOSIS — M4714 Other spondylosis with myelopathy, thoracic region: Secondary | ICD-10-CM | POA: Diagnosis not present

## 2023-03-26 LAB — BASIC METABOLIC PANEL
Anion gap: 11 (ref 5–15)
BUN: 40 mg/dL — ABNORMAL HIGH (ref 6–20)
CO2: 27 mmol/L (ref 22–32)
Calcium: 9.6 mg/dL (ref 8.9–10.3)
Chloride: 98 mmol/L (ref 98–111)
Creatinine, Ser: 2.3 mg/dL — ABNORMAL HIGH (ref 0.61–1.24)
GFR, Estimated: 32 mL/min — ABNORMAL LOW (ref >60)
Glucose, Bld: 121 mg/dL — ABNORMAL HIGH (ref 70–99)
Potassium: 4.4 mmol/L (ref 3.5–5.1)
Sodium: 136 mmol/L (ref 135–145)

## 2023-03-26 LAB — PROTEIN / CREATININE RATIO, URINE
Creatinine, Urine: 81 mg/dL
Protein Creatinine Ratio: 0.48 mg/mg{creat} — ABNORMAL HIGH (ref 0.00–0.15)
Total Protein, Urine: 39 mg/dL

## 2023-03-26 LAB — URINALYSIS, ROUTINE W REFLEX MICROSCOPIC
Bacteria, UA: NONE SEEN
Bilirubin Urine: NEGATIVE
Glucose, UA: NEGATIVE mg/dL
Ketones, ur: NEGATIVE mg/dL
Nitrite: NEGATIVE
Protein, ur: 30 mg/dL — AB
Specific Gravity, Urine: 1.009 (ref 1.005–1.030)
WBC, UA: >50 WBC/hpf (ref 0–5)
pH: 5 (ref 5.0–8.0)

## 2023-03-26 LAB — GLUCOSE, CAPILLARY: Glucose-Capillary: 128 mg/dL — ABNORMAL HIGH (ref 70–99)

## 2023-03-26 MED ORDER — MAGNESIUM SULFATE 2 GM/50ML IV SOLN
2.0000 g | Freq: Once | INTRAVENOUS | Status: DC
  Filled 2023-03-26: qty 50

## 2023-03-26 MED ORDER — FUROSEMIDE 20 MG PO TABS
20.0000 mg | ORAL_TABLET | Freq: Two times a day (BID) | ORAL | Status: DC
  Administered 2023-03-26 – 2023-03-28 (×5): 20 mg via ORAL
  Filled 2023-03-26 (×6): qty 1

## 2023-03-26 MED ORDER — MAGNESIUM GLUCONATE 500 MG PO TABS
250.0000 mg | ORAL_TABLET | Freq: Every day | ORAL | Status: DC
  Administered 2023-03-26 – 2023-04-01 (×7): 250 mg via ORAL
  Filled 2023-03-26 (×8): qty 1

## 2023-03-26 MED ORDER — CEPHALEXIN 250 MG PO CAPS
500.0000 mg | ORAL_CAPSULE | Freq: Two times a day (BID) | ORAL | Status: DC
  Administered 2023-03-26 – 2023-03-27 (×3): 500 mg via ORAL
  Filled 2023-03-26 (×3): qty 2

## 2023-03-26 NOTE — Progress Notes (Signed)
Occupational Therapy Session Note  Patient Details  Name: Manjinder Caraway Cona MRN: [id number] Date of Birth: 1965-11-09  Today's Date: 03/26/2023 OT Individual Time: 4696-2952 OT Individual Time Calculation (min): 73 min    Short Term Goals: Week 1:  OT Short Term Goal 1 (Week 1): Pt will transfer to [facility] with LRAD and mod A OT Short Term Goal 2 (Week 1): Pt will complete LB self care with AE with mod A OT Short Term Goal 3 (Week 1): Pt will stand with mod A with LRAD up to 1 min  Skilled Therapeutic Interventions/Progress Updates:   Pt seen for skilled OT session with pt bed level nude. OT assisted to move from supine to sit for urinal use EOB with CGA. OT provided set up for pull over shirt and mod-max a with reacher for pull on shorts management but + 2 assist in [place] to pull up. OT noted increased overall B LE weakness with inability to rise to stand despite 4 trials. OT then used TB for lateral transfer bed to w/c with + 2 assist and max A. Pt with significant decreased ability to perform needed weight shifts and push ups for clearance. Pt self propelled 2 trials of 50 ft in hallway and completed 2 sets of triceps press in dayroom gym before OT left bedside in w/c for next therapy with chair alarm, needs and nurse call button in reach and chair alarm set.   Pain: mild back pain resolved with repositioning and rest as well as earlier meds given for pain   Therapy Documentation Precautions:  Precautions Precautions: Fall, Back Precaution Comments: spinal Restrictions Weight Bearing Restrictions: No  Therapy/Group: Individual Therapy  Vicenta Dunning 03/26/2023, 7:45 AM

## 2023-03-26 NOTE — Progress Notes (Addendum)
Physical Therapy Session Note  Patient Details  Name: Raymond [name] MRN: [id number] Date of Birth: 02-28-1966  Today's Date: 03/26/2023 PT Individual Time: 0731-0827 PT Individual Time Calculation (min): 56 min   Short Term Goals: Week 1:  PT Short Term Goal 1 (Week 1): Pt will perform bed mobility with consistent MinA and abiding by spinal precautions. PT Short Term Goal 2 (Week 1): Pt will perform sit<>stand from elevated height with knee guard and ModA +2 PT Short Term Goal 3 (Week 1): Pt will perform slide board transfers in either direction with MinA/ CGA. PT Short Term Goal 4 (Week 1): Pt will propel w/c >25 ft with MinA.  Skilled Therapeutic Interventions/Progress Updates:  Per chart review, pt with DVTs in bilateral LEs - pt started on Eliquis and cleared for therapy. Received pt semi-reclined in bed, pt agreeable to PT treatment, and reported pain 6/10 in low back and arms (premedicated) - requested k-pad from MD for low back pain. Session with emphasis on functional mobility/transfers, dressing, generalized strengthening and endurance, and standing tolerance. Donned bilateral teds with total A for edema management. Pt transferred semi-reclined<>sitting L EOB with HOB elevated and use of bedrails with supervision with cues for logroll technique. Donned pants sitting EOB with max A to avoid bending and slipped on shoes with min A. Pt stood from elevated EOB with [place] with max A +2 to pull pants over hips - cues for anterior weight shifiting. Pt then reported urge to void and stood from [facility] flaps with max A and returned to EOB to void in urinal with set up assist - RN made aware. Donned suspenders with max A to avoid twisting and donned pull over shirt with supervision. Stood again in [place] with max A (+2 providing min A) and transferred to [facility] dependently. Pt sat in WC at sink and brushed teeth, combed hair with set up assist.   Pt transported to/from room in [facility] dependently for time  management purposes. Pt requested to work on leg exercises rather than standing. Pt performed seated BLE strengthening on Kinetron at 20 cm/sec for 1 minute x 3 trials with emphasis on glute/quad strength to fatigue. Returned to room and concluded session with pt sitting in WC, needs within reach, and seatbelt alarm on. RN present at bedside administering medications.    Therapy Documentation Precautions:  Precautions Precautions: Fall, Back Precaution Comments: spinal Restrictions Weight Bearing Restrictions: No  Therapy/Group: Individual Therapy [name] Blima Rich PT, DPT 03/26/2023, 6:50 AM

## 2023-03-26 NOTE — IPOC Note (Signed)
Overall Plan of Care [facility]) Patient Details Name: Raymond [name] MRN: [id number] DOB: 1965/07/07  Admitting Diagnosis: Thoracic myelopathy  Hospital Problems: Principal Problem:   Thoracic myelopathy     Functional Problem List: Nursing Bladder, Bowel, Safety, Sensory, Motor, Pain  PT Balance, Endurance, Motor, Nutrition, Pain, Safety, Sensory, Skin Integrity  OT Balance, Endurance, Cognition, Motor, Sensory, Skin Integrity  SLP    TR         Basic ADL's: OT Grooming, Bathing, Dressing, Toileting     Advanced  ADL's: OT Simple Meal Preparation, Light Housekeeping, Laundry     Transfers: PT Bed Mobility, Bed to Chair, Set designer, Occupational psychologist, Research scientist (life sciences): PT Ambulation, Psychologist, prison and probation services, Stairs     Additional Impairments: OT    SLP        TR      Anticipated Outcomes Item Anticipated Outcome  Self Feeding indep  Swallowing      Basic self-care  UB mod I, LB min A  Toileting  min A   Bathroom Transfers min A  Bowel/Bladder  able to manage bowel/bladder program  Transfers  CGA/ supervision for slide board  Locomotion  supervision with w/c  Communication     Cognition     Pain  less than 4  Safety/Judgment  no falls   Therapy Plan: PT Intensity: Minimum of 1-2 x/day ,45 to 90 minutes PT Frequency: 5 out of 7 days PT Duration Estimated Length of Stay: 21-24 days OT Intensity: Minimum of 1-2 x/day, 45 to 90 minutes OT Frequency: 5 out of 7 days OT Duration/Estimated Length of Stay: 3 weeks     Team Interventions: Nursing Interventions Patient/Raymond Education, Bladder Management, Bowel Management, Disease Management/Prevention, Pain Management, Medication Management, Discharge Planning, Psychosocial Support  PT interventions Ambulation/gait training, Community reintegration, DME/adaptive equipment instruction, Neuromuscular re-education, Psychosocial support, Stair training, UE/LE Strength taining/ROM, Wheelchair  propulsion/positioning, Warden/ranger, Discharge planning, Functional electrical stimulation, Pain management, Skin care/wound management, Therapeutic Activities, UE/LE Coordination activities, Cognitive remediation/compensation, Disease management/prevention, Functional mobility training, Patient/Raymond education, Splinting/orthotics, Therapeutic Exercise, Visual/perceptual remediation/compensation  OT Interventions Warden/ranger, Cognitive remediation/compensation, Community reintegration, Discharge planning, Disease mangement/prevention, DME/adaptive equipment instruction, Functional electrical stimulation, Functional mobility training, Neuromuscular re-education, Pain management, Patient/Raymond education, Psychosocial support, Self Care/advanced ADL retraining, Skin care/wound managment, Splinting/orthotics, Therapeutic Activities, Therapeutic Exercise, UE/LE Strength taining/ROM, UE/LE Coordination activities, Visual/perceptual remediation/compensation, Wheelchair propulsion/positioning  SLP Interventions    TR Interventions    SW/CM Interventions Discharge Planning, Psychosocial Support, Patient/Raymond Education, Disease Management/Prevention   Barriers to Discharge MD  Medical stability  Nursing Decreased caregiver support, Home environment access/layout, Neurogenic Bowel & Bladder, Lack of/limited Raymond support, Insurance for SNF coverage, Weight, Weight bearing restrictions home with spouse to 1 level with ramp entry  PT Decreased caregiver support, Neurogenic Bowel & Bladder, Wound Care, Lack of/limited Raymond support, Insurance for SNF coverage, Weight    OT Inaccessible home environment, Home environment access/layout, Weight body habitus, narrow doorways  SLP      SW Lack of/limited Raymond support, Community education officer for SNF coverage, Decreased caregiver support     Team Discharge Planning: Destination: PT-Home ,OT- Home , SLP-  Projected Follow-up: PT-Home  health PT, 24 hour supervision/assistance, Outpatient PT, OT-  Home health OT, SLP-  Projected Equipment Needs: PT-To be determined, OT- Sliding board, Tub/shower bench, Wheelchair (measurements), Wheelchair cushion (measurements), SLP-  Equipment Details: PT- , OT-Raymond [name] need his own w/c and cushion, TTB Patient/Raymond involved in discharge planning: PT- Patient, Raymond member/caregiver,  OT-Patient,  SLP-   MD ELOS: 17-24 days Medical Rehab Prognosis:  Excellent Assessment: The patient has been admitted for CIR therapies with the diagnosis of thoracic myelopathy. The team will be addressing functional mobility, strength, stamina, balance, safety, adaptive techniques and equipment, self-care, bowel and bladder mgt, patient and caregiver education. Goals have been set at Raymond [name]. Anticipated discharge destination is home.        See Team Conference Notes for weekly updates to the plan of care

## 2023-03-26 NOTE — Progress Notes (Signed)
Physical Therapy Session Note  Patient Details  Name: Raymond [name] MRN: [id number] Date of Birth: [birth year]  Today's Date: 03/26/2023 PT Individual Time: 1430-1510 PT Individual Time Calculation (min): 40 min   Short Term Goals: Week 1:  PT Short Term Goal 1 (Week 1): Pt will perform bed mobility with consistent MinA and abiding by spinal precautions. PT Short Term Goal 2 (Week 1): Pt will perform sit<>stand from elevated height with knee guard and ModA +2 PT Short Term Goal 3 (Week 1): Pt will perform slide board transfers in either direction with MinA/ CGA. PT Short Term Goal 4 (Week 1): Pt will propel w/c >25 ft with MinA.  Skilled Therapeutic Interventions/Progress Updates: Pt presented in w/c agreeable to therapy. Pt states significant fatigue, discussed with pt what had been done during day with pt indicating Recchia have overdid it yesterday. Discussed energy conservation and pt agreeable to work on w/c level activities this session. Pt propelled to 4W nsg station with supervision for global conditioning. Pt then indicated need for bathroom. Pt transported back to room for urgency. Pt set up with stedy and discussed rocking to build momentum, pt was able to stand with maxA x 2. Pt transferred to toilet and as pt initiated stand (in same manner) to transfer to toilet pt's BLE began to spasm and feet were unable to reach place. PTA adjusted feet to attempt transfer again however pt's in another spasm. This went on an additional x 3 times with PTA indicating not safe for toilet transfer and to transfer to bed instead. Pt transferred to EOB but additional spasms required this therapist to call for additional assistance. Ultimately PTA was able to stabilize feet against bed rail and PTA blocking pt at heel. Pt ultimately required +4 assist for safety to stand to complete transfer to bed. Initially at EOB pt on edge therefore with Stedy still in place and with minA pt able to scoot posteriorly until safe  in bed. Pt then required maxA x 2 due to fatigue to complete transfer to supine. Pt required maxA x 2 to boost to [facility]. Pt repositioned to comfort and left in bed with call bell within reach, bed alarm on, and current needs met. Primary PT and nsg aware of increased spasms, per discussion with primarily will remain bed level until tomorrow when primary can re-assess.       Therapy Documentation Precautions:  Precautions Precautions: Fall, Back Precaution Comments: spinal Restrictions Weight Bearing Restrictions: No General: PT Amount of Missed Time (min): 20 Minutes PT Missed Treatment Reason: Patient fatigue Vital Signs: Therapy Vitals Temp: 98.4 F (36.9 C) Temp Source: Oral Pulse Rate: 78 Resp: 17 BP: 106/64 Patient Position (if appropriate): Lying Oxygen Therapy SpO2: 95 % O2 Device: Room Air   Therapy/Group: Individual Therapy  Rayma Hegg 03/26/2023, 3:49 PM

## 2023-03-26 NOTE — Progress Notes (Signed)
PROGRESS NOTE   Subjective/Complaints: Cr worse this morning, will consult nephrology, weight reviewed and is stable, will decrease lasix to 20mg  BID, repeat BMP ordered for tomorrow  ROS: +impaired ambulation, +chronic back pain- worse today   Objective:   VAS [place] LOWER EXTREMITY VENOUS (DVT)  Result Date: 03/25/2023  Lower Venous DVT Study Patient Name:  Raymond [name]  Date of Exam:   03/25/2023 Medical Rec #: [id number]    Accession #:    [id number] Date of Birth: 02-17-1966     Patient Gender: M Patient Age:   57 years Exam Location:  [facility] Procedure:      VAS [place] LOWER EXTREMITY VENOUS (DVT) Referring Phys: PAMELA LOVE --------------------------------------------------------------------------------  Indications: Immobility.  Risk Factors: None identified. Anticoagulation: Lovenox. Limitations: Poor ultrasound/tissue interface, body habitus and patient positioning. Comparison Study: No prior studies. Performing Technologist: Chanda Busing RVT  Examination Guidelines: A complete evaluation includes B-mode imaging, spectral Doppler, color Doppler, and power Doppler as needed of all accessible portions of each vessel. Bilateral testing is considered an integral part of a complete examination. Limited examinations for reoccurring indications Stthomas be performed as noted. The reflux portion of the exam is performed with the patient in reverse Trendelenburg.  +---------+---------------+---------+-----------+----------+--------------+ RIGHT    CompressibilityPhasicitySpontaneityPropertiesThrombus Aging +---------+---------------+---------+-----------+----------+--------------+ CFV      Full           Yes      Yes                                 +---------+---------------+---------+-----------+----------+--------------+ SFJ      Full                                                         +---------+---------------+---------+-----------+----------+--------------+ FV Prox  Full                                                        +---------+---------------+---------+-----------+----------+--------------+ FV Mid   Full                                                        +---------+---------------+---------+-----------+----------+--------------+ FV DistalFull                                                        +---------+---------------+---------+-----------+----------+--------------+ PFV      Full                                                        +---------+---------------+---------+-----------+----------+--------------+  POP      Full           Yes      Yes                                 +---------+---------------+---------+-----------+----------+--------------+ PTV      Full                                                        +---------+---------------+---------+-----------+----------+--------------+ PERO     None                                         Acute          +---------+---------------+---------+-----------+----------+--------------+ Gastroc  Partial                                      Acute          +---------+---------------+---------+-----------+----------+--------------+   +---------+---------------+---------+-----------+----------+--------------+ LEFT     CompressibilityPhasicitySpontaneityPropertiesThrombus Aging +---------+---------------+---------+-----------+----------+--------------+ CFV      Full           Yes      Yes                                 +---------+---------------+---------+-----------+----------+--------------+ SFJ      Full                                                        +---------+---------------+---------+-----------+----------+--------------+ FV Prox  Full                                                         +---------+---------------+---------+-----------+----------+--------------+ FV Mid   Full                                                        +---------+---------------+---------+-----------+----------+--------------+ FV DistalFull           Yes      Yes                                 +---------+---------------+---------+-----------+----------+--------------+ PFV      Full                                                        +---------+---------------+---------+-----------+----------+--------------+ POP  Partial        Yes      Yes                  Acute          +---------+---------------+---------+-----------+----------+--------------+ PTV      Full                                                        +---------+---------------+---------+-----------+----------+--------------+ PERO     Full                                                        +---------+---------------+---------+-----------+----------+--------------+     Summary: RIGHT: - Findings consistent with acute deep vein thrombosis involving the right peroneal veins. Findings consistent with acute intramuscular thrombosis involving the right gastrocnemius veins. - No cystic structure found in the popliteal fossa.  LEFT: - Findings consistent with acute deep vein thrombosis involving the left popliteal vein.  - No cystic structure found in the popliteal fossa.  *See table(s) above for measurements and observations. Electronically signed by Coral Else MD on 03/25/2023 at 6:21:42 PM.    Final    Recent Labs    03/24/23 0730  WBC 12.4*  HGB 11.2*  HCT 34.7*  PLT 350   Recent Labs    03/25/23 0454 03/26/23 0559  NA 134* 136  K 3.6 4.4  CL 98 98  CO2 27 27  GLUCOSE 99 121*  BUN 37* 40*  CREATININE 2.07* 2.30*  CALCIUM 9.6 9.6    Intake/Output Summary (Last 24 hours) at 03/26/2023 1015 Last data filed at 03/26/2023 0836 Gross per 24 hour  Intake 600 ml  Output 552 ml  Net 48 ml         Physical Exam: Vital Signs Blood pressure 134/61, pulse 87, temperature 98.4 F (36.9 C), temperature source Oral, resp. rate 20, height 5\' 11"  (1.803 m), weight (!) 144.7 kg, SpO2 92%. Gen: no distress, normal appearing HEENT: oral mucosa pink and moist, NCAT Cardio: Reg rate Chest:     Chest wall: Tenderness (left lateral chest) present.  Abdominal:     General: Bowel sounds are normal. There is no distension.     Palpations: Abdomen is soft.     Tenderness: There is no abdominal tenderness.  Musculoskeletal:        General: Tenderness (back) present. Normal range of motion.     Cervical back: Normal range of motion.     Right lower leg: Edema (trace) present.     Left lower leg: Left lower leg edema: trace.  Skin:    General: Skin is warm and dry.     Comments: Back incision C/D/Intact. Dry scab on left shin. Stasis changes BLE.   Neurological:     Mental Status: He is alert.     Comments: Alert and oriented x 3. Normal insight and awareness. Intact Memory. Normal language and speech. Cranial nerve exam unremarkable. MMT: UE 5/5 bilaterally. RLE 3+ to 4-/5 HF, KE and 4/5 ADF/PF.  LLE 4/5 HF,KE, 4+ADF/PF. Pt senses pain and light touch equally on all 4's.  DTR's 3+ in both LE's. 3-4 beats  of clonus each foot. Mild extensor tone in LE's, knees buckling on standing attempts, exam stable 10/24 Psychiatric:        Mood and Affect: Mood normal.        Behavior: Behavior normal.        Thought Content: Thought content normal.    Assessment/Plan: 1. Functional deficits which require 3+ hours per day of interdisciplinary therapy in a comprehensive inpatient rehab setting. Physiatrist is providing close team supervision and 24 hour management of active medical problems listed below. Physiatrist and rehab team continue to assess barriers to discharge/monitor patient progress toward functional and medical goals  Care Tool:  Bathing    Body parts bathed by patient: Right arm,  Left arm, Chest, Abdomen, Face   Body parts bathed by helper: Front perineal area, Buttocks, Right upper leg, Left upper leg, Right lower leg, Left lower leg     Bathing assist Assist Level: Total Assistance - Patient < 25%     Upper Body Dressing/Undressing Upper body dressing   What is the patient wearing?: Pull over shirt    Upper body assist Assist Level: Minimal Assistance - Patient > 75%    Lower Body Dressing/Undressing Lower body dressing      What is the patient wearing?: Pants     Lower body assist Assist for lower body dressing: Total Assistance - Patient < 25%     Toileting Toileting    Toileting assist Assist for toileting: 2 Helpers     Transfers Chair/bed transfer  Transfers assist     Chair/bed transfer assist level: 2 Helpers     Locomotion Ambulation   Ambulation assist   Ambulation activity did not occur: Safety/medical concerns          Walk 10 feet activity   Assist  Walk 10 feet activity did not occur: Safety/medical concerns        Walk 50 feet activity   Assist Walk 50 feet with 2 turns activity did not occur: Safety/medical concerns         Walk 150 feet activity   Assist Walk 150 feet activity did not occur: Safety/medical concerns         Walk 10 feet on uneven surface  activity   Assist Walk 10 feet on uneven surfaces activity did not occur: Safety/medical concerns         Wheelchair     Assist Is the patient using a wheelchair?: Yes Type of Wheelchair: Manual    Wheelchair assist level: Supervision/Verbal cueing Max wheelchair distance: 199ft    Wheelchair 50 feet with 2 turns activity    Assist        Assist Level: Supervision/Verbal cueing   Wheelchair 150 feet activity     Assist      Assist Level: Supervision/Verbal cueing   Blood pressure 134/61, pulse 87, temperature 98.4 F (36.9 C), temperature source Oral, resp. rate 20, height 5\' 11"  (1.803 m), weight (!)  144.7 kg, SpO2 92%.  Medical Problem List and Plan: 1. 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 Pt s/p T10 laminectomy and discectomy on 03/12/23.              -patient Printz shower             -ELOS/Goals: 17-24 days, goals supervision to min assist at w/c level             -Bilateral PRAFO's  Continue CIR  Grounds pass ordered 2.  Multiple DVTs:  Eliquis started             -antiplatelet therapy: N/A 3. Pain Management: Oxycodone prn--> has been using IV dilaudid couple of times a day. Kpad ordered             --He feels that dilaudid works better but oxycodone last longer. Will schedule oxycodone 10 mg TID for now              --Wean down as pain/activity improves.    4. Mood/Behavior/Sleep: LCSW to follow for evaluation and support.              -antipsychotic agents: N/A 5. Neuropsych/cognition: This patient is capable of making decisions on his own behalf. 6. Skin/Wound Care: back incision is CDI. Rolston be left open to air  7. Fluids/Electrolytes/Nutrition: Monitor I/O. Check CMET in am  8. Spasticity: slightly improved with addition of low dose baclofen last week. Increase baclofen to 5mg  TID for now 9. HTN: Monitor BS TID--continue Amlodipine,  Coreg, Cardura and Lasix bid             --labile with SBP ranging 130-160 during the day. Monitor for orthostatic symptoms.  10.  OSA: CPAP at nights-->Is compliant with his machine and advised him to ask his wife to bring it from home. 11. T2DM: Hgb A1C- 6.0 and well controlled.  D/c CBGs and insulin.  BS stable on glucotrol and metformin daily in am.  --monitor for changes with increase in activity.  10. COPD: Has intermittent cough " from 40 yrs of smoking"             --resume MDI. 11. Hypokalemia: Likely due to diuretic/intake. Will continue K dur  30 meq BID --K- 3.3 on 10/09 (admission)-->recheck BMET in am 12. CKD: Baseline SCr around 1.3--currently 2.30, consulted nephrology, lasix decreased to  20mg  BID, encouraged 6-8 glasses of water daily and repeat BMP tomorrow. Advised not to use Goodys or Mobic.  Lasix decreased to 40mg  BID given AKI and lower weights  13. Leucocytosis: Monitor for fevers and other signs of infection. WBC was up to 13.3 after surgery.             --recheck WBC in am 14. Acute on chronic anemia: Recheck H/H in am 15. Constipation/neurogenic bowel.  Last BM documented on 10/18. -- On Senna 1 bid--> change to 2 in am. Sorbitol 45 cc tonight. -dulcolax supp in pm -probably doesn't need a bowel program  16.  Neurogenic bladder: Continue Flomax and Cardura. Foley was placed at admission             -continue foley tonight.  -Voiding trial in AM  17. Morbid obesity: BMI -44. Was 317 at admission and down to 315 lbs. He would like to lose more weight.  --Will need ongoing education on diet as well as importance of weight loss to promote mobility and wound healing  18.  Anxiety d/o: On Cymbalta with prn Xanax. Add on magnesium level today  19.  Left chest wall/rib pain: Has been ongoing since 4 days and feels that is positional. Lidocaine patches at night as pain worse then.  CXR reviewed and shows no fracture/infection  20. Hypoalbuminemia: encouraged prioritizing protein in diet  21. Dysuria: UA/UC ordered and UA is positive, will start Keflex    LOS: 3 days A FACE TO FACE EVALUATION WAS PERFORMED  Kemp Gomes P Drue Harr 03/26/2023, 10:15 AM

## 2023-03-26 NOTE — Consult Note (Signed)
[facility]  HISTORY AND PHYSICAL  Raymond [name] is an 57 y.o. male.    Chief Complaint: thoracic myelopathy  HPI: Pt is a 67M with a PMH sig for HTN, HLD, DM II, obesity, gout, and admission August 2024 for poor functional status and anasarca who is now seen in consultation at the request of Dr. Carlis Abbott for eval and recs re: AKI on CKD.    Pt presented 03/12/23 after a fall with bowel/ bladder incontinence, overflow dribbling, and loss of lower extremity function.    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  Is now in rehab.    On 10/10, Cr was 1.35.  Was 2.09 n10/22/24--> 2.07 03/25/23 --> 2.30 today, prompting evaluation.  Lasix has been reduced.  Does not seem to have a Foley.  Bps look overcontrolled.  He has no complaints.    PMH: Past Medical History:  Diagnosis Date   Anxiety    COPD (chronic obstructive pulmonary disease) (HCC)    Diabetes mellitus without complication (HCC)    Diverticulosis    with hx of LGIB   Edema    GERD (gastroesophageal reflux disease)    GIB (gastrointestinal bleeding)    Gout    Heavy smoker    Hyperlipidemia    Hypertension    Iron deficiency anemia    Sleep apnea    uses a cpap   PSH: Past Surgical History:  Procedure Laterality Date   CHONDROPLASTY Left 06/28/2014   Procedure: CHONDROPLASTY;  Surgeon: Thera Flake., MD;  Location: [facility];  Service: Orthopedics;  Laterality: Left;   COLONOSCOPY     FOOT FASCIOTOMY     age 49-rt   KNEE ARTHROSCOPY WITH LATERAL MENISECTOMY Left 06/28/2014   Procedure: KNEE ARTHROSCOPY WITH LATERAL MENISECTOMY;  Surgeon: Thera Flake., MD;  Location: [facility];  Service: Orthopedics;  Laterality: Left;   KNEE ARTHROSCOPY WITH MEDIAL MENISECTOMY Left 06/28/2014   Procedure: LEFT KNEE ARTHROSCOPY CHONDROPLASTY/WITH MEDIAL/LATERAL MENISECTOMIES;  Surgeon: Thera Flake., MD;  Location: [facility];  Service:  Orthopedics;  Laterality: Left;   ORIF RADIUS & ULNA FRACTURES  2007   left   THORACIC DISCECTOMY N/A 03/12/2023   Procedure: THORACIC LAMINECTOMY AND  DISCECTOMY;  Surgeon: Coletta Memos, MD;  Location: [facility];  Service: Neurosurgery;  Laterality: N/A;     Past Medical History:  Diagnosis Date   Anxiety    COPD (chronic obstructive pulmonary disease) (HCC)    Diabetes mellitus without complication (HCC)    Diverticulosis    with hx of LGIB   Edema    GERD (gastroesophageal reflux disease)    GIB (gastrointestinal bleeding)    Gout    Heavy smoker    Hyperlipidemia    Hypertension    Iron deficiency anemia    Sleep apnea    uses a cpap    Medications:  Scheduled:  allopurinol  300 mg Oral Daily   apixaban  10 mg Oral BID   Followed by   Melene Muller ON 04/01/2023] apixaban  5 mg Oral BID   atorvastatin  40 mg Oral Daily   baclofen  5 mg Oral TID   bisacodyl  10 mg Rectal QPC supper   carvedilol  25 mg Oral BID WC   cephALEXin  500 mg Oral Q12H   doxazosin  2 mg Oral QHS   DULoxetine  60 mg Oral Daily   furosemide  20 mg Oral  BID   glipiZIDE  10 mg Oral QAC breakfast   lidocaine  2 patch Transdermal Q2200   losartan  50 mg Oral Daily   magnesium gluconate  250 mg Oral QHS   metFORMIN  1,000 mg Oral Q breakfast   oxyCODONE  10 mg Oral TID   pantoprazole  80 mg Oral Daily   pramipexole  0.5 mg Oral BID   senna-docusate  2 tablet Oral q morning   tamsulosin  0.4 mg Oral QHS   tirzepatide  10 mg Subcutaneous Weekly    Medications Prior to Admission  Medication Sig Dispense Refill   acetaminophen (TYLENOL) 500 MG tablet Take 1,000 mg by mouth every 6 (six) hours as needed for moderate pain.     albuterol (VENTOLIN HFA) 108 (90 Base) MCG/ACT inhaler Inhale 2 puffs into the lungs every 6 (six) hours as needed for wheezing or shortness of breath.     allopurinol (ZYLOPRIM) 300 MG tablet Take 300 mg by mouth daily.     ALPRAZolam (XANAX) 0.5 MG tablet Take 0.5 mg by mouth 2  (two) times daily as needed.     amLODipine (NORVASC) 10 MG tablet Take 1 tablet (10 mg total) by mouth daily. (Patient taking differently: Take 10 mg by mouth every evening.)     Aspirin-Acetaminophen-Caffeine (GOODYS EXTRA STRENGTH) 520-260-32.5 MG PACK Take 1 packet by mouth every 6 (six) hours as needed (pain).     atorvastatin (LIPITOR) 40 MG tablet Take 40 mg by mouth every evening.     carvedilol (COREG) 25 MG tablet Take 25 mg by mouth in the morning and at bedtime.     cholecalciferol (CHOLECALCIFEROL) 25 MCG tablet Take 1 tablet (1,000 Units total) by mouth daily.     docusate sodium (COLACE) 100 MG capsule Take 1 capsule (100 mg total) by mouth 2 (two) times daily.     doxazosin (CARDURA) 2 MG tablet Take 2 mg by mouth at bedtime.     DULoxetine (CYMBALTA) 60 MG capsule Take 60 mg by mouth daily.     furosemide (LASIX) 80 MG tablet Take 80 mg by mouth 2 (two) times daily.     glipiZIDE (GLUCOTROL) 10 MG tablet Take 10 mg by mouth in the morning and at bedtime.     losartan (COZAAR) 50 MG tablet Take 50 mg by mouth in the morning and at bedtime.     magnesium hydroxide (MILK OF MAGNESIA) 400 MG/5ML suspension Take 30-60 mLs by mouth daily as needed for moderate constipation.     meloxicam (MOBIC) 15 MG tablet Take 15 mg by mouth daily.     metFORMIN (GLUCOPHAGE) 500 MG tablet Take 500 mg by mouth 2 (two) times daily with a meal.     metolazone (ZAROXOLYN) 2.5 MG tablet Take 2.5 mg by mouth daily as needed (edema). At lunch.     MOUNJARO 10 MG/0.5ML Pen Inject 10 mg into the skin once a week.     naproxen sodium (ALEVE) 220 MG tablet Take 220 mg by mouth 2 (two) times daily as needed (pain).     nystatin-triamcinolone (MYCOLOG II) cream Apply 1 application topically daily as needed (itch).      omeprazole (PRILOSEC) 40 MG capsule Take 40 mg by mouth daily.     polyethylene glycol (MIRALAX / GLYCOLAX) 17 g packet Take 17 g by mouth 2 (two) times daily. (Patient taking differently: Take 17  g by mouth daily as needed for moderate constipation.)     Potassium Chloride  ER 20 MEQ TBCR TAKE 1 & 1/2 (ONE & ONE-HALF) TABLETS BY MOUTH TWICE DAILY Oral for 30 Days     pramipexole (MIRAPEX) 0.5 MG tablet Take 0.5 mg by mouth in the morning and at bedtime.     senna-docusate (SENOKOT-S) 8.6-50 MG tablet Take 1 tablet by mouth 2 (two) times daily. (Patient taking differently: Take 1 tablet by mouth daily as needed for moderate constipation.)     tamsulosin (FLOMAX) 0.4 MG CAPS capsule Take 0.4 mg by mouth at bedtime.      ALLERGIES:   Allergies  Allergen Reactions   Sulfa Antibiotics Hives    FAM HX: Family History  Problem Relation Age of Onset   Diabetes Mother    Hypertension Mother    Diabetes Father    Hypertension Father    Cancer Other     Social History:   reports that he has been smoking cigarettes. He has never used smokeless tobacco. He reports current alcohol use. He reports that he does not use drugs.  ROS: ROS: all other systems reviewed and are negative except as per HPI  Blood pressure 106/64, pulse 78, temperature 98.4 F (36.9 C), temperature source Oral, resp. rate 17, height 5\' 11"  (1.803 m), weight (!) 144.7 kg, SpO2 95%. PHYSICAL EXAM: Physical Exam GEN sitting in wheelchair HEENT EOMI PERRL NECK no JVD PULM clear CV RRR ABD soft EXT 2+ LE edema NEURO AAO x 3, in wheelchair    Results for orders placed or performed during the hospital encounter of 03/23/23 (from the past 48 hour(s))  Basic metabolic panel     Status: Abnormal   Collection Time: 03/25/23  4:54 AM  Result Value Ref Range   Sodium 134 (L) 135 - 145 mmol/L   Potassium 3.6 3.5 - 5.1 mmol/L   Chloride 98 98 - 111 mmol/L   CO2 27 22 - 32 mmol/L   Glucose, Bld 99 70 - 99 mg/dL    Comment: Glucose reference range applies only to samples taken after fasting for at least 8 hours.   BUN 37 (H) 6 - 20 mg/dL   Creatinine, Ser 2.53 (H) 0.61 - 1.24 mg/dL   Calcium 9.6 8.9 - 66.4 mg/dL    GFR, Estimated 36 (L) >60 mL/min    Comment: (NOTE) Calculated using the CKD-EPI Creatinine Equation (2021)    Anion gap 9 5 - 15    Comment: Performed at [facility], [address], [place], [place] [address]  Magnesium     Status: None   Collection Time: 03/25/23  4:54 AM  Result Value Ref Range   Magnesium 1.7 1.7 - 2.4 mg/dL    Comment: Performed at [facility], [address], [place], [place] [address]  Urinalysis, Routine w reflex microscopic -Urine, Clean Catch     Status: Abnormal   Collection Time: 03/25/23  9:42 AM  Result Value Ref Range   Color, Urine YELLOW YELLOW   APPearance CLOUDY (A) CLEAR   Specific Gravity, Urine 1.010 1.005 - 1.030   pH 5.0 5.0 - 8.0   Glucose, UA NEGATIVE NEGATIVE mg/dL   Hgb urine dipstick MODERATE (A) NEGATIVE   Bilirubin Urine NEGATIVE NEGATIVE   Ketones, ur NEGATIVE NEGATIVE mg/dL   Protein, ur 638 (A) NEGATIVE mg/dL   Nitrite NEGATIVE NEGATIVE   Leukocytes,Ua MODERATE (A) NEGATIVE   RBC / HPF 11-20 0 - 5 RBC/hpf   WBC, UA >50 0 - 5 WBC/hpf   Bacteria, UA MANY (  A) NONE SEEN   Squamous Epithelial / HPF 0-5 0 - 5 /HPF   WBC Clumps PRESENT    Mucus PRESENT     Comment: Performed at [facility], [address], [place], [place] [address]  Urine Culture     Status: Abnormal (Preliminary result)   Collection Time: 03/25/23  2:17 PM   Specimen: Urine, Clean Catch  Result Value Ref Range   Specimen Description URINE, CLEAN CATCH    Special Requests NONE    Culture (A)     >=100,000 COLONIES/mL ESCHERICHIA COLI SUSCEPTIBILITIES TO FOLLOW Performed at [facility], [address] [place], [place] [address]    Report Status PENDING   Basic metabolic panel     Status: Abnormal   Collection Time: 03/26/23  5:59 AM  Result Value Ref Range   Sodium 136 135 - 145 mmol/L   Potassium 4.4 3.5 - 5.1 mmol/L   Chloride 98 98 - 111 mmol/L   CO2 27 22 - 32 mmol/L   Glucose, Bld 121 (H) 70 - 99 mg/dL    Comment:  Glucose reference range applies only to samples taken after fasting for at least 8 hours.   BUN 40 (H) 6 - 20 mg/dL   Creatinine, Ser 3.32 (H) 0.61 - 1.24 mg/dL   Calcium 9.6 8.9 - 95.1 mg/dL   GFR, Estimated 32 (L) >60 mL/min    Comment: (NOTE) Calculated using the CKD-EPI Creatinine Equation (2021)    Anion gap 11 5 - 15    Comment: Performed at [facility], [address], [place], [place] [address]  Glucose, capillary     Status: Abnormal   Collection Time: 03/26/23  6:11 AM  Result Value Ref Range   Glucose-Capillary 128 (H) 70 - 99 mg/dL    Comment: Glucose reference range applies only to samples taken after fasting for at least 8 hours.    VAS [place] LOWER EXTREMITY VENOUS (DVT)  Result Date: 03/25/2023  Lower Venous DVT Study Patient Name:  Raymond [name]  Date of Exam:   03/25/2023 Medical Rec #: [id number]    Accession #:    [id number] Date of Birth: July 09, 1965     Patient Gender: M Patient Age:   53 years Exam Location:  [facility] Procedure:      VAS [place] LOWER EXTREMITY VENOUS (DVT) Referring Phys: PAMELA LOVE --------------------------------------------------------------------------------  Indications: Immobility.  Risk Factors: None identified. Anticoagulation: Lovenox. Limitations: Poor ultrasound/tissue interface, body habitus and patient positioning. Comparison Study: No prior studies. Performing Technologist: Chanda Busing RVT  Examination Guidelines: A complete evaluation includes B-mode imaging, spectral Doppler, color Doppler, and power Doppler as needed of all accessible portions of each vessel. Bilateral testing is considered an integral part of a complete examination. Limited examinations for reoccurring indications Redner be performed as noted. The reflux portion of the exam is performed with the patient in reverse Trendelenburg.  +---------+---------------+---------+-----------+----------+--------------+ RIGHT     CompressibilityPhasicitySpontaneityPropertiesThrombus Aging +---------+---------------+---------+-----------+----------+--------------+ CFV      Full           Yes      Yes                                 +---------+---------------+---------+-----------+----------+--------------+ SFJ      Full                                                        +---------+---------------+---------+-----------+----------+--------------+  FV Prox  Full                                                        +---------+---------------+---------+-----------+----------+--------------+ FV Mid   Full                                                        +---------+---------------+---------+-----------+----------+--------------+ FV DistalFull                                                        +---------+---------------+---------+-----------+----------+--------------+ PFV      Full                                                        +---------+---------------+---------+-----------+----------+--------------+ POP      Full           Yes      Yes                                 +---------+---------------+---------+-----------+----------+--------------+ PTV      Full                                                        +---------+---------------+---------+-----------+----------+--------------+ PERO     None                                         Acute          +---------+---------------+---------+-----------+----------+--------------+ Gastroc  Partial                                      Acute          +---------+---------------+---------+-----------+----------+--------------+   +---------+---------------+---------+-----------+----------+--------------+ LEFT     CompressibilityPhasicitySpontaneityPropertiesThrombus Aging +---------+---------------+---------+-----------+----------+--------------+ CFV      Full           Yes      Yes                                  +---------+---------------+---------+-----------+----------+--------------+ SFJ      Full                                                        +---------+---------------+---------+-----------+----------+--------------+  FV Prox  Full                                                        +---------+---------------+---------+-----------+----------+--------------+ FV Mid   Full                                                        +---------+---------------+---------+-----------+----------+--------------+ FV DistalFull           Yes      Yes                                 +---------+---------------+---------+-----------+----------+--------------+ PFV      Full                                                        +---------+---------------+---------+-----------+----------+--------------+ POP      Partial        Yes      Yes                  Acute          +---------+---------------+---------+-----------+----------+--------------+ PTV      Full                                                        +---------+---------------+---------+-----------+----------+--------------+ PERO     Full                                                        +---------+---------------+---------+-----------+----------+--------------+     Summary: RIGHT: - Findings consistent with acute deep vein thrombosis involving the right peroneal veins. Findings consistent with acute intramuscular thrombosis involving the right gastrocnemius veins. - No cystic structure found in the popliteal fossa.  LEFT: - Findings consistent with acute deep vein thrombosis involving the left popliteal vein.  - No cystic structure found in the popliteal fossa.  *See table(s) above for measurements and observations. Electronically signed by Coral Else MD on 03/25/2023 at 6:21:42 PM.    Final     Assessment/Plan  AKI on CKD: possible obstruction vs overcontrolled BP  - check UA and  UP/C--> on Keflex for UTI  - stop amlodipine  - on losartan too- [place] need to stop  - lasix has already been reduced  - will send for renal US  - close attn to baclofen and PPI  2.  Thoracic myelopathy:  - in rehab  3.  HTN:   - amlodipine stopped  - on cardura, carvedilol, losartan, Lasix  4.  Dispo: in rehab    Bufford Buttner 03/26/2023, 4:46 PM

## 2023-03-26 NOTE — Plan of Care (Signed)
  Problem: SCI BLADDER ELIMINATION Goal: RH STG MANAGE BLADDER WITH ASSISTANCE Description: STG Manage Bladder With min Assistance Outcome: Progressing Goal: RH STG SCI MANAGE BLADDER PROGRAM W/ASSISTANCE Description: Patient will be able to manage bladder with min assist  Outcome: Progressing   Problem: RH SKIN INTEGRITY Goal: RH STG SKIN FREE OF INFECTION/BREAKDOWN Description: Skin will remain free of infection/breakdown with min assist  Outcome: Progressing

## 2023-03-27 DIAGNOSIS — M4714 Other spondylosis with myelopathy, thoracic region: Secondary | ICD-10-CM | POA: Diagnosis not present

## 2023-03-27 DIAGNOSIS — F54 Psychological and behavioral factors associated with disorders or diseases classified elsewhere: Secondary | ICD-10-CM | POA: Diagnosis not present

## 2023-03-27 LAB — BASIC METABOLIC PANEL
Anion gap: 10 (ref 5–15)
BUN: 41 mg/dL — ABNORMAL HIGH (ref 6–20)
CO2: 25 mmol/L (ref 22–32)
Calcium: 9.4 mg/dL (ref 8.9–10.3)
Chloride: 98 mmol/L (ref 98–111)
Creatinine, Ser: 2.27 mg/dL — ABNORMAL HIGH (ref 0.61–1.24)
GFR, Estimated: 32 mL/min — ABNORMAL LOW (ref >60)
Glucose, Bld: 126 mg/dL — ABNORMAL HIGH (ref 70–99)
Potassium: 4.3 mmol/L (ref 3.5–5.1)
Sodium: 133 mmol/L — ABNORMAL LOW (ref 135–145)

## 2023-03-27 LAB — URINE CULTURE: Culture: >=100000 — AB

## 2023-03-27 LAB — GLUCOSE, CAPILLARY
Glucose-Capillary: 131 mg/dL — ABNORMAL HIGH (ref 70–99)
Glucose-Capillary: 85 mg/dL (ref 70–99)

## 2023-03-27 MED ORDER — NITROFURANTOIN MONOHYD MACRO 100 MG PO CAPS
100.0000 mg | ORAL_CAPSULE | Freq: Two times a day (BID) | ORAL | Status: DC
  Administered 2023-03-27 – 2023-04-01 (×11): 100 mg via ORAL
  Filled 2023-03-27 (×12): qty 1

## 2023-03-27 NOTE — Plan of Care (Signed)
  Problem: SCI BLADDER ELIMINATION Goal: RH STG SCI MANAGE BLADDER PROGRAM W/ASSISTANCE Description: Patient will be able to manage bladder with min assist  Outcome: Progressing   Problem: RH SKIN INTEGRITY Goal: RH STG SKIN FREE OF INFECTION/BREAKDOWN Description: Skin will remain free of infection/breakdown with min assist  Outcome: Progressing

## 2023-03-27 NOTE — Progress Notes (Signed)
IP Rehab Bowel Program Documentation   Bowel Program Start time 1800  Dig Stim Indicated? Yes  Dig Stim Prior to Suppository or mini Enema X 3   Output from dig stim: Minimal  Ordered intervention: Suppository Yes , mini enema No ,   Repeat dig stim after Suppository or Mini enema  X night shift,  Output? Minimal   Bowel Program Complete? No , handoff given Night shift  Patient Tolerated? Yes

## 2023-03-27 NOTE — Progress Notes (Signed)
[facility] Progress Note   1M with HTN, HLD, DM II, obesity, gout, and admission August 2024 for poor functional status and anasarca. 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  On 10/10, Cr was 1.35.  Was 2.09 n10/22/24--> 2.07 03/25/23 --> 2.30 today, prompting evaluation.    Assessment/ Plan:   AKI on CKD: possible obstruction vs overcontrolled BP             - check UA and UP/C--> on Keflex for UTI             - stopped amlodipine -> BP better today. Will hold off on any more changes with other antihypertensives. Renal function appears to be stabilizing. Will follow closely with you.             - on losartan too- Britz need to stop             - lasix has already been reduced             - renal US -> perinephric fluid but no e/o obstruction             - close attn to baclofen and PPI   2.  Thoracic myelopathy:             - in rehab   3.  HTN:              - amlodipine stopped             - on cardura, carvedilol, losartan, Lasix  Subjective:   Feeling weak yest but a little better today. Denies f/c/n/v/sob.   Objective:   BP 122/65 (BP Location: Right Arm)   Pulse 76   Temp 98 F (36.7 C)   Resp 15   Ht 5\' 11"  (1.803 m)   Wt (!) 143.8 kg   SpO2 95%   BMI 44.22 kg/m   Intake/Output Summary (Last 24 hours) at 03/27/2023 0951 Last data filed at 03/27/2023 0736 Gross per 24 hour  Intake 720 ml  Output 1000 ml  Net -280 ml   Weight change: -0.901 kg  Physical Exam: GEN sitting in wheelchair, doing rehab HEENT EOMI  NECK no JVD PULM clear CV RRR ABD soft EXT 2+ LE edema, TEDS hose NEURO AAO x 3  Imaging: US RENAL  Result Date: 03/26/2023 CLINICAL DATA:  Acute kidney insufficiency EXAM: RENAL / URINARY TRACT ULTRASOUND COMPLETE COMPARISON:  None Available. FINDINGS: Right Kidney: Renal  measurements: 10.4 x 6.0 x 5.5 cm = volume: 186.6 mL. No collecting system dilatation. There is however perinephric fluid. Left Kidney: Renal measurements: 12.1 x 6.1 x 5.3 cm = volume: 203.3 mL. No collecting system dilatation. Perinephric fluid identified. Bladder: Mildly distended. Other: None. IMPRESSION: No collecting system dilatation. Bilateral nonspecific perinephric fluid. Electronically Signed   By: Karen Kays M.D.   On: 03/26/2023 19:01   VAS [place] LOWER EXTREMITY VENOUS (DVT)  Result Date: 03/25/2023  Lower Venous DVT Study Patient Name:  DONNIS SNOWDON Fredell  Date of Exam:   03/25/2023 Medical Rec #: [id number]    Accession #:    [id number] Date of Birth: [birth year]     Patient Gender: M Patient Age:   57 years Exam Location:  [facility] Procedure:      VAS [place] LOWER EXTREMITY VENOUS (DVT) Referring Phys: PAMELA LOVE --------------------------------------------------------------------------------  Indications: Immobility.  Risk Factors: None identified. Anticoagulation: Lovenox. Limitations: Poor ultrasound/tissue interface, body habitus and patient positioning. Comparison Study: No prior studies. Performing Technologist: Chanda Busing RVT  Examination Guidelines: A complete evaluation includes B-mode imaging, spectral Doppler, color Doppler, and power Doppler as needed of all accessible portions of each vessel. Bilateral testing is considered an integral part of a complete examination. Limited examinations for reoccurring indications [place] be performed as noted. The reflux portion of the exam is performed with the patient in reverse Trendelenburg.  +---------+---------------+---------+-----------+----------+--------------+ RIGHT    CompressibilityPhasicitySpontaneityPropertiesThrombus Aging +---------+---------------+---------+-----------+----------+--------------+ CFV      Full           Yes      Yes                                  +---------+---------------+---------+-----------+----------+--------------+ SFJ      Full                                                        +---------+---------------+---------+-----------+----------+--------------+ FV Prox  Full                                                        +---------+---------------+---------+-----------+----------+--------------+ FV Mid   Full                                                        +---------+---------------+---------+-----------+----------+--------------+ FV DistalFull                                                        +---------+---------------+---------+-----------+----------+--------------+ PFV      Full                                                        +---------+---------------+---------+-----------+----------+--------------+ POP      Full           Yes      Yes                                 +---------+---------------+---------+-----------+----------+--------------+ PTV      Full                                                        +---------+---------------+---------+-----------+----------+--------------+ PERO     None  Acute          +---------+---------------+---------+-----------+----------+--------------+ Gastroc  Partial                                      Acute          +---------+---------------+---------+-----------+----------+--------------+   +---------+---------------+---------+-----------+----------+--------------+ LEFT     CompressibilityPhasicitySpontaneityPropertiesThrombus Aging +---------+---------------+---------+-----------+----------+--------------+ CFV      Full           Yes      Yes                                 +---------+---------------+---------+-----------+----------+--------------+ SFJ      Full                                                         +---------+---------------+---------+-----------+----------+--------------+ FV Prox  Full                                                        +---------+---------------+---------+-----------+----------+--------------+ FV Mid   Full                                                        +---------+---------------+---------+-----------+----------+--------------+ FV DistalFull           Yes      Yes                                 +---------+---------------+---------+-----------+----------+--------------+ PFV      Full                                                        +---------+---------------+---------+-----------+----------+--------------+ POP      Partial        Yes      Yes                  Acute          +---------+---------------+---------+-----------+----------+--------------+ PTV      Full                                                        +---------+---------------+---------+-----------+----------+--------------+ PERO     Full                                                        +---------+---------------+---------+-----------+----------+--------------+  Summary: RIGHT: - Findings consistent with acute deep vein thrombosis involving the right peroneal veins. Findings consistent with acute intramuscular thrombosis involving the right gastrocnemius veins. - No cystic structure found in the popliteal fossa.  LEFT: - Findings consistent with acute deep vein thrombosis involving the left popliteal vein.  - No cystic structure found in the popliteal fossa.  *See table(s) above for measurements and observations. Electronically signed by Coral Else MD on 03/25/2023 at 6:21:42 PM.    Final     Labs: BMET Recent Labs  Lab 03/24/23 0730 03/25/23 0454 03/26/23 0559 03/27/23 0649  NA 139 134* 136 133*  K 4.6 3.6 4.4 4.3  CL 96* 98 98 98  CO2 27 27 27 25   GLUCOSE 104* 99 121* 126*  BUN 33* 37* 40* 41*  CREATININE 2.09* 2.07* 2.30* 2.27*   CALCIUM 10.3 9.6 9.6 9.4   CBC Recent Labs  Lab 03/24/23 0730  WBC 12.4*  NEUTROABS 8.4*  HGB 11.2*  HCT 34.7*  MCV 90.1  PLT 350    Medications:     allopurinol  300 mg Oral Daily   apixaban  10 mg Oral BID   Followed by   Melene Muller ON 04/01/2023] apixaban  5 mg Oral BID   atorvastatin  40 mg Oral Daily   baclofen  5 mg Oral TID   bisacodyl  10 mg Rectal QPC supper   carvedilol  25 mg Oral BID WC   cephALEXin  500 mg Oral Q12H   doxazosin  2 mg Oral QHS   DULoxetine  60 mg Oral Daily   furosemide  20 mg Oral BID   glipiZIDE  10 mg Oral QAC breakfast   lidocaine  2 patch Transdermal Q2200   losartan  50 mg Oral Daily   magnesium gluconate  250 mg Oral QHS   metFORMIN  1,000 mg Oral Q breakfast   oxyCODONE  10 mg Oral TID   pantoprazole  80 mg Oral Daily   pramipexole  0.5 mg Oral BID   senna-docusate  2 tablet Oral q morning   tamsulosin  0.4 mg Oral QHS   tirzepatide  10 mg Subcutaneous Weekly      Paulene Floor, MD 03/27/2023, 9:51 AM

## 2023-03-27 NOTE — Progress Notes (Signed)
Occupational Therapy Session Note  Patient Details  Name: Raymond [name] MRN: [id number] Date of Birth: 11/16/1965  Today's Date: 03/27/2023 OT Individual Time: 1005-1100 & 6440-3474 OT Individual Time Calculation (min): 55 min  & 70 min   Short Term Goals: Week 1:  OT Short Term Goal 1 (Week 1): Pt will transfer to [facility] with LRAD and mod A OT Short Term Goal 2 (Week 1): Pt will complete LB self care with AE with mod A OT Short Term Goal 3 (Week 1): Pt will stand with mod A with LRAD up to 1 min  Skilled Therapeutic Interventions/Progress Updates:  Session 1 Skilled OT intervention completed with focus on ADL retraining, functional transfers and sit <> stands. Pt received seated in w/c, agreeable to session. No pain reported.  Pt requested to wash up. Seated in w/c, pt was able to doff/donn shirt, bathe UB, donn deo and complete oral care with supervision with cues needed for thoroughness/sequencing.  Transported dependently in w/c <> gym. Pt with increased difficulty this session laterally leaning, requiring 1 person to assist lifting RLE and 2nd person placing board under hip. Max cues needed throughout as pt with poor command following as if in a rush to transfer, requiring heavy mod A of 2 to transfer on board > EOM towards Rt side. Pt with difficulty processing BLE placement and coordinating hands and feet during transfer. Able to sit EOM with supervision without LOB while OT demonstrated hand/BLE placement for efficient lifting of buttocks while on board as pt frequently gets stuck. With step by step cues and slower pace, pt required +2 for board placement, then only min A +2 > Lt side on board > w/c.   Pt reported urge to void. Back in room, pt was able to position urinal without assist, but despite time, was unable to void with pt reporting urgency but inability to pee with discussion on UTI effects per diagnosis. Pt requested to return to bed to rest his back prior to PM sessions.    Max A +2 to stand in STEDY then dependent transfer to EOB. Mod A +2 to stand from perched position then pt with poor control, flopping onto EOB. Required max A to scoot hips back onto bed with STEDY in front of pt due to inability to scoot back and pt too far on EOB. Discussed controlling his descent and sitting hips further back to prevent having to scoot. Max A +2 to transition supine. Pt remained semi upright in bed, with bed alarm on/activated, and with all needs in reach at end of session.  Session 2 Skilled OT intervention completed with focus on family education, functional transfers, sit > stands, and BUE exercises. Pt received supine in bed, agreeable to session. 4/10 pain reported in thoracic spine; pre-medicated. OT offered rest breaks, repositioning throughout for pain reduction.  Pt's wife present. Discussed pt's PLOF before the fall, with wife reporting she helped place the slide board, but pt able to transfer himself and at night time could get himself <> bari DABSC without assist and managing all steps. Education provided to pt's wife about pt's CLOF of +2 with great challenge to stand in STEDY, but especially with RW, and +2 still needed for slide board transfers. Discussed reasonable/achievable goals and purpose of IPR to prepare pt for DC home with his applicable help and to decrease burden of care but higher goals such as ambulation [place] be later on down the road.  Pt requested to doff current shorts  and switch for larger ones. Pt needed max multimodal cues for rolling R<>L in bed with use of knee bent, however required Total A for donning of pants. Only able to recall 1/3 spinal precautions verbally. Max cues needed for log roll technique, with max A overall for advancing BLE to EOB and for trunk elevation.   Donned shoes total A, total A board placement, then mod A of 1, but 2nd person for w/c and board stability during transfer > w/c. With rocking 1,2,3 method and attempting to  lift his bottom each time, pt able to stand in STEDY with mod A of 2 in order to put seat flaps down. Pt did have LLE spasm that began therefore stood again with min A +2 then returned to w/c.  Transported dependently in w/c <> for time. Seated in w/c, pt completed the following BUE exercises to promote BUE strength/endurance needed for independence with functional transfers and BADLs: (With 4 lb dowel) 2x15 - chest press - overhead press - bicep curls  Back in room, pt remained seated in w/c with family present, with belt alarm on/activated, and with all needs in reach at end of session.   Therapy Documentation Precautions:  Precautions Precautions: Fall, Back Precaution Comments: spinal Restrictions Weight Bearing Restrictions: No    Therapy/Group: Individual Therapy  Melvyn Novas, MS, OTR/L  03/27/2023, 2:27 PM

## 2023-03-27 NOTE — Consult Note (Signed)
Neuropsychological Consultation Comprehensive Inpatient Rehab   Patient:   Raymond [name]   DOB:   [birth year]  MR Number:  [id number]  Location:  [facility] [address] CENTER A [address] [place] [address] Dept: [phone] Loc: [phone]           Date of Service:   03/27/2023  Start Time:   8 AM End Time:   9 AM  Provider/Observer:  Arley Phenix, Psy.D.       Clinical Neuropsychologist       Billing Code/Service: [phone]  Reason for Service:    [name] is a 57 year old male referred for neuropsychological consultation during his ongoing admission into the comprehensive inpatient rehabilitation unit.  Patient has a past medical history including COPD, type 2 diabetes, obstructive sleep apnea, tobacco use, fibula fracture in August, chronic kidney disease, super morbid obesity.  Patient was admitted on 03/11/2023 with lower extremity weakness with gait disorder, urinary incontinence and decreased coordination.  Workup indicated the patient had a large herniated nucleus pulposus at T10/T11 and underwent T10 laminectomy with discectomy on 10/9.  Postoperatively, the patient had poor pain control.  Sensation bilateral feet and ankle have been improving but the patient required moderate assistance with steady to stand and assistance with ADLs.  Patient had been independent prior to having to stop work during the summer and was increasingly debilitated.  Patient was admitted to CIR due to functional decline.  During the clinical visit today and after review of medical records there was a discussion about the patient's lack of use of his home BiPAP machine during his hospitalization.  Patient's wife happened to be on the phone as well at the time and they stated that he had been on oxygen postsurgery and did not feel the need of using his BiPAP machine.  However, the patient had been compliant with his BiPAP machine for years and his  wife agreed to bring his BiPAP machine in today and the patient agreed to begin using it every time he slept.  Patient was unaware of its importance regarding the healing process postsurgery and this was reinforced.  Patient was oriented x 4 and described frustration with his acute difficulties but awareness of the need for surgery and noted that he has been improving over the past week.  Patient admits to fluctuating motivation but is encouraged seeing some of the functional gains he is achieving and we discussed issues related to barriers of continuing his therapy and improvement post discharge and strategies to continue with improvement post discharge.  Patient denied significant depression or anxiety and felt that he was coping appropriately given the situation.  The patient admitted to procrastination and difficulties in the past getting himself motivated to do things that he did not have to do and we addressed these issues with cognitive strategies for dealing with them.  HPI for the current admission:    HPI: Raymond [name]. [name] is a 57 year old male with history of COPD, T2DM, OSA, tobacco use, fibula fx/FTT 01/2023, CKD 111a, morbid obesity who was admitted on 03/11/23 with BLE weakness with gait disorder, urinary incontinence and decreased coordination BLE. He ws found to have large HNP at T10/T11 and underwent T10 laminectomy with diskectomy by Dr. Franky Macho on 09/10. Post op he had issues with poor pain control and has been using IV dilaudid with oxycodone prn. Foley placed in ED and remains in place. Po intake has been good  but has not had BM for 3 days and usually goes daily. Has been refusing CPAP but compliant at home. Sensation bilateral feet and ankle are improving. PT/OT has been working with patient who requires Mod assist with Stedy to stand and min assist with ADLs. He was independent and working prior to this summer. CIR recommended due to functional decline.   Medical History:   Past Medical  History:  Diagnosis Date   Anxiety    COPD (chronic obstructive pulmonary disease) (HCC)    Diabetes mellitus without complication (HCC)    Diverticulosis    with hx of LGIB   Edema    GERD (gastroesophageal reflux disease)    GIB (gastrointestinal bleeding)    Gout    Heavy smoker    Hyperlipidemia    Hypertension    Iron deficiency anemia    Sleep apnea    uses a cpap         Patient Active Problem List   Diagnosis Date Noted   Coping style affecting medical condition 03/27/2023   Thoracic myelopathy 03/23/2023   HNP (herniated nucleus pulposus with myelopathy), thoracic 03/12/2023   Hyperlipidemia 12/24/2022   Failure to thrive in adult 12/24/2022   Tobacco abuse 03/18/2018   BPH associated with nocturia 03/11/2017   History of substance abuse (HCC) 03/11/2017   Restless leg syndrome 03/11/2017   Restrictive lung disease 05/14/2016   OSA on CPAP 05/14/2016   Moderate COPD (chronic obstructive pulmonary disease) (HCC) 05/14/2016   Smoking greater than 40 pack years 05/14/2016   SOB (shortness of breath) 10/06/2014   Edema of extremities 10/06/2014   Benign essential HTN 10/06/2014   Type 2 diabetes mellitus, without long-term current use of insulin (HCC) 07/18/2011    Behavioral Observation/Mental Status:   Suvir Goertz Voshell  presents as a 57 y.o.-year-old Right handed Caucasian Male who appeared his stated age. his dress was Appropriate and he was Well Groomed and his manners were Appropriate to the situation.  his participation was indicative of Appropriate and Attentive behaviors.  There were physical disabilities noted.  he displayed an appropriate level of cooperation and motivation.    Interactions:    Active Appropriate  Attention:   within normal limits and attention span and concentration were age appropriate  Memory:   within normal limits; recent and remote memory intact  Visuo-spatial:   not examined  Speech (Volume):  normal  Speech:   normal;  normal  Thought Process:  Coherent, Logical, and Oriented  Though Content:  WNL; not suicidal and not homicidal  Orientation:   person, place, time/date, and situation  Judgment:   Fair  Planning:   Fair  Affect:    Appropriate  Mood:    Euthymic  Insight:   Good  Intelligence:   normal  Psychiatric History:  No prior psychiatric history  Family Med/Psych History:  Family History  Problem Relation Age of Onset   Diabetes Mother    Hypertension Mother    Diabetes Father    Hypertension Father    Cancer Other     Impression/DX:   Winton Pasion Amrhein is a 57 year old male referred for neuropsychological consultation during his ongoing admission into the comprehensive inpatient rehabilitation unit.  Patient has a past medical history including COPD, type 2 diabetes, obstructive sleep apnea, tobacco use, fibula fracture in August, chronic kidney disease, super morbid obesity.  Patient was admitted on 03/11/2023 with lower extremity weakness with gait disorder, urinary incontinence and decreased coordination.  Workup indicated  the patient had a large herniated nucleus pulposus at T10/T11 and underwent T10 laminectomy with discectomy on 10/9.  Postoperatively, the patient had poor pain control.  Sensation bilateral feet and ankle have been improving but the patient required moderate assistance with steady to stand and assistance with ADLs.  Patient had been independent prior to having to stop work during the summer and was increasingly debilitated.  Patient was admitted to CIR due to functional decline.  During the clinical visit today and after review of medical records there was a discussion about the patient's lack of use of his home BiPAP machine during his hospitalization.  Patient's wife happened to be on the phone as well at the time and they stated that he had been on oxygen postsurgery and did not feel the need of using his BiPAP machine.  However, the patient had been compliant with his  BiPAP machine for years and his wife agreed to bring his BiPAP machine in today and the patient agreed to begin using it every time he slept.  Patient was unaware of its importance regarding the healing process postsurgery and this was reinforced.  Patient was oriented x 4 and described frustration with his acute difficulties but awareness of the need for surgery and noted that he has been improving over the past week.  Patient admits to fluctuating motivation but is encouraged seeing some of the functional gains he is achieving and we discussed issues related to barriers of continuing his therapy and improvement post discharge and strategies to continue with improvement post discharge.  Patient denied significant depression or anxiety and felt that he was coping appropriately given the situation.  The patient admitted to procrastination and difficulties in the past getting himself motivated to do things that he did not have to do and we addressed these issues with cognitive strategies for dealing with them.           Electronically Signed   _______________________ Arley Phenix, Psy.D. Clinical Neuropsychologist

## 2023-03-27 NOTE — Progress Notes (Signed)
   03/26/23 2230  BiPAP/CPAP/SIPAP  Reason BIPAP/CPAP not in use Non-compliant

## 2023-03-27 NOTE — Plan of Care (Signed)
  Problem: Consults Goal: RH SPINAL CORD INJURY PATIENT EDUCATION Description:  See Patient Education module for education specifics.  Outcome: Progressing   Problem: SCI BOWEL ELIMINATION Goal: RH STG MANAGE BOWEL WITH ASSISTANCE Description: STG Manage Bowel with min/mod Assistance. Outcome: Progressing Goal: RH STG SCI MANAGE BOWEL WITH MEDICATION WITH ASSISTANCE Description: STG SCI Manage bowel with medication with min assistance. Outcome: Progressing Goal: RH STG SCI MANAGE BOWEL PROGRAM W/ASSIST OR AS APPROPRIATE Description: STG SCI Manage bowel program w/min/mod assist or as appropriate. Outcome: Progressing   Problem: SCI BLADDER ELIMINATION Goal: RH STG MANAGE BLADDER WITH ASSISTANCE Description: STG Manage Bladder With min Assistance Outcome: Progressing Goal: RH STG SCI MANAGE BLADDER PROGRAM W/ASSISTANCE Description: Patient will be able to manage bladder with min assist  Outcome: Progressing   Problem: RH SKIN INTEGRITY Goal: RH STG SKIN FREE OF INFECTION/BREAKDOWN Description: Skin will remain free of infection/breakdown with min assist  Outcome: Progressing Goal: RH STG MAINTAIN SKIN INTEGRITY WITH ASSISTANCE Description: STG Maintain Skin Integrity With min Assistance. Outcome: Progressing Goal: RH STG ABLE TO PERFORM INCISION/WOUND CARE W/ASSISTANCE Description: STG Able To Perform Incision/Wound Care With min Assistance. Outcome: Progressing   Problem: RH SAFETY Goal: RH STG ADHERE TO SAFETY PRECAUTIONS W/ASSISTANCE/DEVICE Description: STG Adhere to Safety Precautions With cueing Assistance/Device. Outcome: Progressing Goal: RH STG DECREASED RISK OF FALL WITH ASSISTANCE Description: STG Decreased Risk of Fall With min Assistance. Outcome: Progressing   Problem: RH PAIN MANAGEMENT Goal: RH STG PAIN MANAGED AT OR BELOW PT'S PAIN GOAL Description: Pain will be less than 4 with PRN medications min assist  Outcome: Progressing   Problem: RH KNOWLEDGE  DEFICIT SCI Goal: RH STG INCREASE KNOWLEDGE OF SELF CARE AFTER SCI Description: Patient/caregiver will be able to manage medications, skin care, and bowel/bladder from nursing education, nursing handouts, and other resources independently  Outcome: Progressing

## 2023-03-27 NOTE — Progress Notes (Signed)
PROGRESS NOTE   Subjective/Complaints: No new complaints this morning Discussed that creatinine has improved and nephrology recommended stopping amlodipine.   ROS: +impaired ambulation, +chronic back pain, +bilateral knee bucklinng   Objective:   US RENAL  Result Date: 03/26/2023 CLINICAL DATA:  Acute kidney insufficiency EXAM: RENAL / URINARY TRACT ULTRASOUND COMPLETE COMPARISON:  None Available. FINDINGS: Right Kidney: Renal measurements: 10.4 x 6.0 x 5.5 cm = volume: 186.6 mL. No collecting system dilatation. There is however perinephric fluid. Left Kidney: Renal measurements: 12.1 x 6.1 x 5.3 cm = volume: 203.3 mL. No collecting system dilatation. Perinephric fluid identified. Bladder: Mildly distended. Other: None. IMPRESSION: No collecting system dilatation. Bilateral nonspecific perinephric fluid. Electronically Signed   By: Karen Kays M.D.   On: 03/26/2023 19:01   VAS [place] LOWER EXTREMITY VENOUS (DVT)  Result Date: 03/25/2023  Lower Venous DVT Study Patient Name:  Raymond [name]  Date of Exam:   03/25/2023 Medical Rec #: [id number]    Accession #:    [id number] Date of Birth: 10/03/1965     Patient Gender: M Patient Age:   57 years Exam Location:  [facility] Procedure:      VAS [place] LOWER EXTREMITY VENOUS (DVT) Referring Phys: PAMELA LOVE --------------------------------------------------------------------------------  Indications: Immobility.  Risk Factors: None identified. Anticoagulation: Lovenox. Limitations: Poor ultrasound/tissue interface, body habitus and patient positioning. Comparison Study: No prior studies. Performing Technologist: Chanda Busing RVT  Examination Guidelines: A complete evaluation includes B-mode imaging, spectral Doppler, color Doppler, and power Doppler as needed of all accessible portions of each vessel. Bilateral testing is considered an integral part of a complete examination. Limited examinations  for reoccurring indications [place] be performed as noted. The reflux portion of the exam is performed with the patient in reverse Trendelenburg.  +---------+---------------+---------+-----------+----------+--------------+ RIGHT    CompressibilityPhasicitySpontaneityPropertiesThrombus Aging +---------+---------------+---------+-----------+----------+--------------+ CFV      Full           Yes      Yes                                 +---------+---------------+---------+-----------+----------+--------------+ SFJ      Full                                                        +---------+---------------+---------+-----------+----------+--------------+ FV Prox  Full                                                        +---------+---------------+---------+-----------+----------+--------------+ FV Mid   Full                                                        +---------+---------------+---------+-----------+----------+--------------+  FV DistalFull                                                        +---------+---------------+---------+-----------+----------+--------------+ PFV      Full                                                        +---------+---------------+---------+-----------+----------+--------------+ POP      Full           Yes      Yes                                 +---------+---------------+---------+-----------+----------+--------------+ PTV      Full                                                        +---------+---------------+---------+-----------+----------+--------------+ PERO     None                                         Acute          +---------+---------------+---------+-----------+----------+--------------+ Gastroc  Partial                                      Acute          +---------+---------------+---------+-----------+----------+--------------+    +---------+---------------+---------+-----------+----------+--------------+ LEFT     CompressibilityPhasicitySpontaneityPropertiesThrombus Aging +---------+---------------+---------+-----------+----------+--------------+ CFV      Full           Yes      Yes                                 +---------+---------------+---------+-----------+----------+--------------+ SFJ      Full                                                        +---------+---------------+---------+-----------+----------+--------------+ FV Prox  Full                                                        +---------+---------------+---------+-----------+----------+--------------+ FV Mid   Full                                                        +---------+---------------+---------+-----------+----------+--------------+  FV DistalFull           Yes      Yes                                 +---------+---------------+---------+-----------+----------+--------------+ PFV      Full                                                        +---------+---------------+---------+-----------+----------+--------------+ POP      Partial        Yes      Yes                  Acute          +---------+---------------+---------+-----------+----------+--------------+ PTV      Full                                                        +---------+---------------+---------+-----------+----------+--------------+ PERO     Full                                                        +---------+---------------+---------+-----------+----------+--------------+     Summary: RIGHT: - Findings consistent with acute deep vein thrombosis involving the right peroneal veins. Findings consistent with acute intramuscular thrombosis involving the right gastrocnemius veins. - No cystic structure found in the popliteal fossa.  LEFT: - Findings consistent with acute deep vein thrombosis involving the left popliteal  vein.  - No cystic structure found in the popliteal fossa.  *See table(s) above for measurements and observations. Electronically signed by Coral Else MD on 03/25/2023 at 6:21:42 PM.    Final    No results for input(s): "WBC", "HGB", "HCT", "PLT" in the last 72 hours.  Recent Labs    03/26/23 0559 03/27/23 0649  NA 136 133*  K 4.4 4.3  CL 98 98  CO2 27 25  GLUCOSE 121* 126*  BUN 40* 41*  CREATININE 2.30* 2.27*  CALCIUM 9.6 9.4    Intake/Output Summary (Last 24 hours) at 03/27/2023 1100 Last data filed at 03/27/2023 0736 Gross per 24 hour  Intake 720 ml  Output 1000 ml  Net -280 ml        Physical Exam: Vital Signs Blood pressure 122/65, pulse 76, temperature 98 F (36.7 C), resp. rate 15, height 5\' 11"  (1.803 m), weight (!) 143.8 kg, SpO2 95%. Gen: no distress, normal appearing HEENT: oral mucosa pink and moist, NCAT Cardio: Reg rate Chest:     Chest wall: Tenderness (left lateral chest) present.  Abdominal:     General: Bowel sounds are normal. There is no distension.     Palpations: Abdomen is soft.     Tenderness: There is no abdominal tenderness.  Musculoskeletal:        General: Tenderness (back) present. Normal range of motion.     Cervical back: Normal range of motion.     Right lower leg: Edema (trace) present.  Left lower leg: Left lower leg edema: trace.  Skin:    General: Skin is warm and dry.     Comments: Back incision C/D/Intact. Dry scab on left shin. Stasis changes BLE.   Neurological:     Mental Status: He is alert.     Comments: Alert and oriented x 3. Normal insight and awareness. Intact Memory. Normal language and speech. Cranial nerve exam unremarkable. MMT: UE 5/5 bilaterally. RLE 4-/5 HF, KE and 4/5 ADF/PF.  LLE 4/5 HF,KE, 4+ADF/PF. Pt senses pain and light touch equally on all 4's.  DTR's 3+ in both LE's. 3-4 beats of clonus each foot. Mild extensor tone in LE's, knees buckling on standing attempts, exam improved 10/25 Psychiatric:         Mood and Affect: Mood normal.        Behavior: Behavior normal.        Thought Content: Thought content normal.    Assessment/Plan: 1. Functional deficits which require 3+ hours per day of interdisciplinary therapy in a comprehensive inpatient rehab setting. Physiatrist is providing close team supervision and 24 hour management of active medical problems listed below. Physiatrist and rehab team continue to assess barriers to discharge/monitor patient progress toward functional and medical goals  Care Tool:  Bathing    Body parts bathed by patient: Right arm, Left arm, Chest, Abdomen, Face   Body parts bathed by helper: Front perineal area, Buttocks, Right upper leg, Left upper leg, Right lower leg, Left lower leg     Bathing assist Assist Level: Total Assistance - Patient < 25%     Upper Body Dressing/Undressing Upper body dressing   What is the patient wearing?: Pull over shirt    Upper body assist Assist Level: Minimal Assistance - Patient > 75%    Lower Body Dressing/Undressing Lower body dressing      What is the patient wearing?: Pants     Lower body assist Assist for lower body dressing: Total Assistance - Patient < 25%     Toileting Toileting    Toileting assist Assist for toileting: 2 Helpers     Transfers Chair/bed transfer  Transfers assist     Chair/bed transfer assist level: 2 Helpers     Locomotion Ambulation   Ambulation assist   Ambulation activity did not occur: Safety/medical concerns          Walk 10 feet activity   Assist  Walk 10 feet activity did not occur: Safety/medical concerns        Walk 50 feet activity   Assist Walk 50 feet with 2 turns activity did not occur: Safety/medical concerns         Walk 150 feet activity   Assist Walk 150 feet activity did not occur: Safety/medical concerns         Walk 10 feet on uneven surface  activity   Assist Walk 10 feet on uneven surfaces activity did not  occur: Safety/medical concerns         Wheelchair     Assist Is the patient using a wheelchair?: Yes Type of Wheelchair: Manual    Wheelchair assist level: Supervision/Verbal cueing Max wheelchair distance: 80    Wheelchair 50 feet with 2 turns activity    Assist        Assist Level: Supervision/Verbal cueing   Wheelchair 150 feet activity     Assist      Assist Level: Supervision/Verbal cueing   Blood pressure 122/65, pulse 76, temperature 98 F (36.7 C), resp.  rate 15, height 5\' 11"  (1.803 m), weight (!) 143.8 kg, SpO2 95%.  Medical Problem List and Plan: 1. 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 Pt s/p T10 laminectomy and discectomy on 03/12/23.              -patient [place] shower             -ELOS/Goals: 17-24 days, goals supervision to min assist at w/c level             -Bilateral PRAFO's  Conitnue CIR  Grounds pass ordered 2.  Multiple DVTs: Eliquis started             -antiplatelet therapy: N/A 3. Pain Management: Oxycodone prn--> has been using IV dilaudid couple of times a day. Kpad ordered             --He feels that dilaudid works better but oxycodone last longer. Will schedule oxycodone 10 mg TID for now              --Wean down as pain/activity improves.    4. Mood/Behavior/Sleep: LCSW to follow for evaluation and support.              -antipsychotic agents: N/A 5. Neuropsych/cognition: This patient is capable of making decisions on his own behalf. 6. Skin/Wound Care: back incision is CDI. [place] be left open to air  7. Fluids/Electrolytes/Nutrition: Monitor I/O. Check CMET in am  8. Spasticity: slightly improved with addition of low dose baclofen last week. Increase baclofen to 5mg  TID for now 9. HTN: Monitor BS TID--continue Amlodipine,  Coreg, Cardura and Lasix bid             --labile with SBP ranging 130-160 during the day. Monitor for orthostatic symptoms.  10.  OSA: CPAP at nights-->Is compliant  with his machine and advised him to ask his wife to bring it from home. 11. T2DM: Hgb A1C- 6.0 and well controlled.  D/c CBGs and insulin.  BS stable on glucotrol and metformin daily in am.  --monitor for changes with increase in activity.  10. COPD: Has intermittent cough " from 40 yrs of smoking"             --resume MDI. 11. Hypokalemia: Likely due to diuretic/intake. Will continue K dur  30 meq BID --K- 3.3 on 10/09 (admission)-->recheck BMET in am 12. CKD: Baseline SCr around 1.3--worsened to 2.3, and now improved to 2.27, discussed with patient, consulted nephrology, lasix decreased to 20mg  BID, encouraged 6-8 glasses of water daily and repeat BMP tomorrow. Advised not to use Goodys or Mobic. Amlodipine d/ced Lasix decreased to 40mg  BID given AKI and lower weights  13. Leucocytosis: Monitor for fevers and other signs of infection. WBC was up to 13.3 after surgery.             --recheck WBC in am 14. Acute on chronic anemia: Recheck H/H in am 15. Constipation/neurogenic bowel.  Last BM documented on 10/18. -- On Senna 1 bid--> change to 2 in am. Sorbitol 45 cc tonight. -dulcolax supp in pm -probably doesn't need a bowel program  16.  Neurogenic bladder: Continue Flomax and Cardura. Foley was placed at admission             -continue foley tonight.  -Voiding trial in AM  17. Morbid obesity: BMI -44. Was 317 at admission and down to 315 lbs. He would like to lose more weight.  --Will need ongoing education on diet as well as  importance of weight loss to promote mobility and wound healing  18.  Anxiety d/o: Continue Cymbalta with prn Xanax. Add on magnesium level today  19.  Left chest wall/rib pain: Has been ongoing since 4 days and feels that is positional. Lidocaine patches at night as pain worse then.  CXR reviewed and shows no fracture/infection  20. Hypoalbuminemia: encouraged prioritizing protein in diet  21. Dysuria: UA/UC ordered and UA is positive, will start Keflex. UC  reviewed nad shows <100,000 E coli, d/c susceptibilities.     LOS: 4 days A FACE TO FACE EVALUATION WAS PERFORMED  Shulamit Donofrio P Kalayah Leske 03/27/2023, 11:00 AM

## 2023-03-27 NOTE — Progress Notes (Signed)
Physical Therapy Session Note  Patient Details  Name: Raymond [name] MRN: [id number] Date of Birth: [birth year]  Today's Date: 03/27/2023 PT Individual Time: 0902-1000 PT Individual Time Calculation (min): 58 min   Short Term Goals: Week 1:  PT Short Term Goal 1 (Week 1): Pt will perform bed mobility with consistent MinA and abiding by spinal precautions. PT Short Term Goal 2 (Week 1): Pt will perform sit<>stand from elevated height with knee guard and ModA +2 PT Short Term Goal 3 (Week 1): Pt will perform slide board transfers in either direction with MinA/ CGA. PT Short Term Goal 4 (Week 1): Pt will propel w/c >25 ft with MinA.  Skilled Therapeutic Interventions/Progress Updates: Pt presents semi-reclined in bed and agreeable to therapy.  Pt rolls side to side w/ supervision and siderails.  Pt required total A to doff shorts and don clean shorts.  Pt given washcloth and able to bathe peri area, but still requires PT to complete.  Pt transfers sup to sit w/ siderails and CGA.  Pt sat EOB for donning shirt w/ set-up and shoes.  Pt transferred sit to stand w/ A + 2 for elevated bed height, but unable to attempt transfer to w/c.  Pt stood x 30 seconds before requesting to sit.  Pt performed sit to stand in [place] w/ mod A +2 and then transfers to w/c.  Pt able to scoot back into w/c.  Pt wheeled x 90' to dayroom.  Pt performed 3 x 5-8 w/c push-ups w/ blocking of B knees.  W/c ar rests turned around for higher platform in front for push-ups.  Verbal cues for forward lean to stress LES.  Pt returned to room and remained sitting in w/c w/ all needs in reach, awaiting OT.     Therapy Documentation Precautions:  Precautions Precautions: Fall, Back Precaution Comments: spinal Restrictions Weight Bearing Restrictions: No General:   Vital Signs:   Pain:0/10 w/ meds.     Therapy/Group: Individual Therapy  [name] 03/27/2023, 10:01 AM

## 2023-03-27 NOTE — Progress Notes (Signed)
Patient ID: Raymond [name], male   DOB: [birth year], 57 y.o.   MRN: [id number]   Sw met with patient  to discuss primary SW will be OOO 10/28 and 10/29, returning on 10/30. No additional questions or concerns.

## 2023-03-28 DIAGNOSIS — M4714 Other spondylosis with myelopathy, thoracic region: Secondary | ICD-10-CM | POA: Diagnosis not present

## 2023-03-28 LAB — BASIC METABOLIC PANEL
Anion gap: 12 (ref 5–15)
BUN: 44 mg/dL — ABNORMAL HIGH (ref 6–20)
CO2: 26 mmol/L (ref 22–32)
Calcium: 9.5 mg/dL (ref 8.9–10.3)
Chloride: 98 mmol/L (ref 98–111)
Creatinine, Ser: 2.17 mg/dL — ABNORMAL HIGH (ref 0.61–1.24)
GFR, Estimated: 34 mL/min — ABNORMAL LOW (ref >60)
Glucose, Bld: 61 mg/dL — ABNORMAL LOW (ref 70–99)
Potassium: 4.4 mmol/L (ref 3.5–5.1)
Sodium: 136 mmol/L (ref 135–145)

## 2023-03-28 LAB — URINALYSIS, ROUTINE W REFLEX MICROSCOPIC
Bacteria, UA: NONE SEEN
Bilirubin Urine: NEGATIVE
Glucose, UA: NEGATIVE mg/dL
Hgb urine dipstick: NEGATIVE
Ketones, ur: NEGATIVE mg/dL
Nitrite: NEGATIVE
Protein, ur: 30 mg/dL — AB
Specific Gravity, Urine: 1.01 (ref 1.005–1.030)
pH: 5 (ref 5.0–8.0)

## 2023-03-28 LAB — GLUCOSE, CAPILLARY: Glucose-Capillary: 115 mg/dL — ABNORMAL HIGH (ref 70–99)

## 2023-03-28 MED ORDER — FUROSEMIDE 20 MG PO TABS
10.0000 mg | ORAL_TABLET | Freq: Two times a day (BID) | ORAL | Status: DC
  Administered 2023-03-29 – 2023-03-31 (×5): 10 mg via ORAL
  Filled 2023-03-28 (×5): qty 1

## 2023-03-28 NOTE — Progress Notes (Signed)
[facility] Progress Note   43M with HTN, HLD, DM II, obesity, gout, and admission August 2024 for poor functional status and anasarca. 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  On 10/10, Cr was 1.35.  Was 2.09 n10/22/24--> 2.07 03/25/23 --> 2.30 today, prompting evaluation.    Assessment/ Plan:   AKI on CKD: possible obstruction vs overcontrolled BP             - check UA and UP/C--> on Keflex for UTI             - stopped amlodipine -> BP better today (120-140's). Will hold off on any more changes with other antihypertensives. Renal function appears to be stabilizing. Will follow closely with you.             - on losartan too- [place] need to stop; will check BMet today 1st. If stable will sign off.             - lasix has already been reduced             - renal US -> perinephric fluid but no e/o obstruction             - close attn to baclofen and PPI   2.  Thoracic myelopathy:             - in rehab   3.  HTN:              - amlodipine stopped             - on cardura, carvedilol, losartan, Lasix  Subjective:   Denies f/c/n/v/sob. Appetite ok.   Objective:   BP (!) 142/78 (BP Location: Left Arm)   Pulse 95   Temp 98 F (36.7 C) (Oral)   Resp 18   Ht 5\' 11"  (1.803 m)   Wt (!) 142.9 kg   SpO2 91%   BMI 43.94 kg/m   Intake/Output Summary (Last 24 hours) at 03/28/2023 1056 Last data filed at 03/28/2023 6045 Gross per 24 hour  Intake 960 ml  Output 2250 ml  Net -1290 ml   Weight change: -0.899 kg  Physical Exam: GEN sitting in wheelchair, doing rehab HEENT EOMI  NECK no JVD PULM clear CV RRR ABD soft EXT 2+ LE edema, TEDS hose NEURO AAO x 3  Imaging: US RENAL  Result Date: 03/26/2023 CLINICAL DATA:  Acute kidney insufficiency EXAM: RENAL / URINARY TRACT ULTRASOUND COMPLETE COMPARISON:   None Available. FINDINGS: Right Kidney: Renal measurements: 10.4 x 6.0 x 5.5 cm = volume: 186.6 mL. No collecting system dilatation. There is however perinephric fluid. Left Kidney: Renal measurements: 12.1 x 6.1 x 5.3 cm = volume: 203.3 mL. No collecting system dilatation. Perinephric fluid identified. Bladder: Mildly distended. Other: None. IMPRESSION: No collecting system dilatation. Bilateral nonspecific perinephric fluid. Electronically Signed   By: Karen Kays M.D.   On: 03/26/2023 19:01    Labs: BMET Recent Labs  Lab 03/24/23 0730 03/25/23 0454 03/26/23 0559 03/27/23 0649  NA 139 134* 136 133*  K 4.6 3.6 4.4 4.3  CL 96* 98 98 98  CO2 27 27 27 25   GLUCOSE 104* 99 121* 126*  BUN 33* 37* 40* 41*  CREATININE 2.09* 2.07* 2.30* 2.27*  CALCIUM 10.3 9.6 9.6 9.4   CBC Recent Labs  Lab 03/24/23 0730  WBC 12.4*  NEUTROABS 8.4*  HGB 11.2*  HCT 34.7*  MCV 90.1  PLT 350    Medications:     allopurinol  300 mg Oral Daily   apixaban  10 mg Oral BID   Followed by   Melene Muller ON 04/01/2023] apixaban  5 mg Oral BID   atorvastatin  40 mg Oral Daily   baclofen  5 mg Oral TID   bisacodyl  10 mg Rectal QPC supper   carvedilol  25 mg Oral BID WC   doxazosin  2 mg Oral QHS   DULoxetine  60 mg Oral Daily   furosemide  20 mg Oral BID   glipiZIDE  10 mg Oral QAC breakfast   lidocaine  2 patch Transdermal Q2200   losartan  50 mg Oral Daily   magnesium gluconate  250 mg Oral QHS   metFORMIN  1,000 mg Oral Q breakfast   nitrofurantoin (macrocrystal-monohydrate)  100 mg Oral Q12H   oxyCODONE  10 mg Oral TID   pantoprazole  80 mg Oral Daily   pramipexole  0.5 mg Oral BID   senna-docusate  2 tablet Oral q morning   tamsulosin  0.4 mg Oral QHS   tirzepatide  10 mg Subcutaneous Weekly      Paulene Floor, MD 03/28/2023, 10:56 AM

## 2023-03-28 NOTE — Progress Notes (Signed)
PROGRESS NOTE   Subjective/Complaints: Sleepy this morning Had large BM yesterday after bowel program and incontinent episode today, will d/c HS suppository  ROS: +impaired ambulation, +chronic back pain, +bilateral knee buckling, +stool incontinence   Objective:   No results found. No results for input(s): "WBC", "HGB", "HCT", "PLT" in the last 72 hours.  Recent Labs    03/27/23 0649 03/28/23 1139  NA 133* 136  K 4.3 4.4  CL 98 98  CO2 25 26  GLUCOSE 126* 61*  BUN 41* 44*  CREATININE 2.27* 2.17*  CALCIUM 9.4 9.5    Intake/Output Summary (Last 24 hours) at 03/28/2023 1943 Last data filed at 03/28/2023 1815 Gross per 24 hour  Intake 717 ml  Output 2550 ml  Net -1833 ml        Physical Exam: Vital Signs Blood pressure 130/60, pulse 75, temperature 97.7 F (36.5 C), temperature source Oral, resp. rate 19, height 5\' 11"  (1.803 m), weight (!) 142.9 kg, SpO2 92%. Gen: no distress, normal appearing HEENT: oral mucosa pink and moist, NCAT Cardio: Reg rate Chest:     Chest wall: Tenderness (left lateral chest) present.  Abdominal:     General: Bowel sounds are normal. There is no distension.     Palpations: Abdomen is soft.     Tenderness: There is no abdominal tenderness.  Musculoskeletal:        General: Tenderness (back) present. Normal range of motion.     Cervical back: Normal range of motion.     Right lower leg: Edema (trace) present.     Left lower leg: Left lower leg edema: trace.  Skin:    General: Skin is warm and dry.     Comments: Back incision C/D/Intact. Dry scab on left shin. Stasis changes BLE.   Neurological:     Mental Status: He is alert.     Comments: Alert and oriented x 3. Normal insight and awareness. Intact Memory. Normal language and speech. Cranial nerve exam unremarkable. MMT: UE 5/5 bilaterally. RLE 4-/5 HF, KE and 4/5 ADF/PF.  LLE 4/5 HF,KE, 4+ADF/PF. Pt senses pain and light  touch equally on all 4's.  DTR's 3+ in both LE's. 3-4 beats of clonus each foot. Mild extensor tone in LE's, knees buckling on standing attempts, exam stable 10/26 Psychiatric:        Mood and Affect: Mood normal.        Behavior: Behavior normal.        Thought Content: Thought content normal.    Assessment/Plan: 1. Functional deficits which require 3+ hours per day of interdisciplinary therapy in a comprehensive inpatient rehab setting. Physiatrist is providing close team supervision and 24 hour management of active medical problems listed below. Physiatrist and rehab team continue to assess barriers to discharge/monitor patient progress toward functional and medical goals  Care Tool:  Bathing    Body parts bathed by patient: Right arm, Left arm, Chest, Abdomen, Face   Body parts bathed by helper: Front perineal area, Buttocks, Right upper leg, Left upper leg, Right lower leg, Left lower leg     Bathing assist Assist Level: Total Assistance - Patient < 25%     Upper  Body Dressing/Undressing Upper body dressing   What is the patient wearing?: Pull over shirt    Upper body assist Assist Level: Minimal Assistance - Patient > 75%    Lower Body Dressing/Undressing Lower body dressing      What is the patient wearing?: Pants     Lower body assist Assist for lower body dressing: Total Assistance - Patient < 25%     Toileting Toileting    Toileting assist Assist for toileting: 2 Helpers     Transfers Chair/bed transfer  Transfers assist     Chair/bed transfer assist level: 2 Helpers     Locomotion Ambulation   Ambulation assist   Ambulation activity did not occur: Safety/medical concerns          Walk 10 feet activity   Assist  Walk 10 feet activity did not occur: Safety/medical concerns        Walk 50 feet activity   Assist Walk 50 feet with 2 turns activity did not occur: Safety/medical concerns         Walk 150 feet  activity   Assist Walk 150 feet activity did not occur: Safety/medical concerns         Walk 10 feet on uneven surface  activity   Assist Walk 10 feet on uneven surfaces activity did not occur: Safety/medical concerns         Wheelchair     Assist Is the patient using a wheelchair?: Yes Type of Wheelchair: Manual    Wheelchair assist level: Supervision/Verbal cueing Max wheelchair distance: 80    Wheelchair 50 feet with 2 turns activity    Assist        Assist Level: Supervision/Verbal cueing   Wheelchair 150 feet activity     Assist      Assist Level: Supervision/Verbal cueing   Blood pressure 130/60, pulse 75, temperature 97.7 F (36.5 C), temperature source Oral, resp. rate 19, height 5\' 11"  (1.803 m), weight (!) 142.9 kg, SpO2 92%.  Medical Problem List and Plan: 1. 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 Pt s/p T10 laminectomy and discectomy on 03/12/23.              -patient Foor shower             -ELOS/Goals: 17-24 days, goals supervision to min assist at w/c level             -Bilateral PRAFO's  Conitnue CIR  Grounds pass ordered 2.  Multiple DVTs: Eliquis started             -antiplatelet therapy: N/A 3. Pain Management: Oxycodone prn--> has been using IV dilaudid couple of times a day. Kpad ordered             --He feels that dilaudid works better but oxycodone last longer. Will schedule oxycodone 10 mg TID for now              --Wean down as pain/activity improves.    4. Mood/Behavior/Sleep: LCSW to follow for evaluation and support.              -antipsychotic agents: N/A 5. Neuropsych/cognition: This patient is capable of making decisions on his own behalf. 6. Skin/Wound Care: back incision is CDI. [place] be left open to air  7. Fluids/Electrolytes/Nutrition: Monitor I/O. Check CMET in am  8. Spasticity: slightly improved with addition of low dose baclofen last week. Increase baclofen to 5mg  TID  for now 9. HTN: Monitor BS  TID--continue Amlodipine,  Coreg, Cardura and Lasix bid             --labile with SBP ranging 130-160 during the day. Monitor for orthostatic symptoms.  10.  OSA: CPAP at nights-->Is compliant with his machine and advised him to ask his wife to bring it from home. 11. T2DM: Hgb A1C- 6.0 and well controlled.  D/c CBGs and insulin.  BS stable on glucotrol and metformin daily in am.  --monitor for changes with increase in activity.  10. COPD: Has intermittent cough " from 40 yrs of smoking"             --resume MDI. 11. Hypokalemia: Likely due to diuretic/intake. Will continue K dur  30 meq BID --K- 3.3 on 10/09 (admission)-->recheck BMET in am 12. CKD: Baseline SCr around 1.3--creatinine reviewed and improved, discussed with patient, consulted nephrology, lasix decreased to 20mg  BID, encouraged 6-8 glasses of water daily and repeat BMP tomorrow. Advised not to use Goodys or Mobic. Amlodipine d/ced Lasix decreased to 40mg  BID given AKI and lower weights  13. Leucocytosis: Monitor for fevers and other signs of infection. WBC was up to 13.3 after surgery.             --recheck WBC in am 14. Acute on chronic anemia: Recheck H/H in am 15. Constipation/neurogenic bowel.  Last BM documented on 10/18. -- On Senna 1 bid--> change to 2 in am. Sorbitol 45 cc tonight. -dulcolax supp in pm -probably doesn't need a bowel program  16.  Neurogenic bladder: Continue Flomax and Cardura. Foley was placed at admission             -continue foley tonight.  -Voiding trial in AM  17. Morbid obesity: BMI -44. Was 317 at admission and down to 315 lbs. He would like to lose more weight.  --Will need ongoing education on diet as well as importance of weight loss to promote mobility and wound healing  18.  Anxiety d/o: Continue Cymbalta with prn Xanax. Magnesium added given suboptimal levels  19.  Left chest wall/rib pain: Has been ongoing since 4 days and feels that is positional.  Lidocaine patches at night as pain worse then.  CXR reviewed and shows no fracture/infection  20. Hypoalbuminemia: encouraged prioritizing protein in diet  21. ESBL: continue nitrofurantoin  22. Polypharmacy: decrease lasix to 10mg  BID given decreased weight and AKI on CKD    LOS: 5 days A FACE TO FACE EVALUATION WAS PERFORMED  Drema Pry Emori Mumme 03/28/2023, 7:43 PM

## 2023-03-29 DIAGNOSIS — M4714 Other spondylosis with myelopathy, thoracic region: Secondary | ICD-10-CM | POA: Diagnosis not present

## 2023-03-29 LAB — BASIC METABOLIC PANEL
Anion gap: 12 (ref 5–15)
BUN: 47 mg/dL — ABNORMAL HIGH (ref 6–20)
CO2: 25 mmol/L (ref 22–32)
Calcium: 9.6 mg/dL (ref 8.9–10.3)
Chloride: 100 mmol/L (ref 98–111)
Creatinine, Ser: 2.1 mg/dL — ABNORMAL HIGH (ref 0.61–1.24)
GFR, Estimated: 36 mL/min — ABNORMAL LOW (ref >60)
Glucose, Bld: 92 mg/dL (ref 70–99)
Potassium: 4.1 mmol/L (ref 3.5–5.1)
Sodium: 137 mmol/L (ref 135–145)

## 2023-03-29 MED ORDER — TIRZEPATIDE 10 MG/0.5ML ~~LOC~~ SOAJ
10.0000 mg | SUBCUTANEOUS | Status: DC
  Filled 2023-03-29 (×2): qty 0.5

## 2023-03-29 MED ORDER — TIRZEPATIDE 10 MG/0.5ML ~~LOC~~ SOAJ
10.0000 mg | SUBCUTANEOUS | Status: DC
  Administered 2023-03-29 – 2023-04-05 (×2): 10 mg via SUBCUTANEOUS
  Filled 2023-03-29 (×4): qty 0.5

## 2023-03-29 NOTE — Progress Notes (Signed)
Occupational Therapy Session Note  Patient Details  Name: Raymond [name] MRN: [id number] Date of Birth: 30-Apr-1966  Today's Date: 03/29/2023 OT Individual Time: 1300-1325 OT Individual Time Calculation (min): 25 min    Short Term Goals: Week 1:  OT Short Term Goal 1 (Week 1): Pt will transfer to [facility] with LRAD and mod A OT Short Term Goal 2 (Week 1): Pt will complete LB self care with AE with mod A OT Short Term Goal 3 (Week 1): Pt will stand with mod A with LRAD up to 1 min  Skilled Therapeutic Interventions/Progress Updates:    Pt resting in bed upon arrival. Supine>sit EOB with supervision using bed rail with HOB elevated. Sitting balance EOB with supervision. Pt required tot A to don slip-on shoes; difficulty with LLE hip flexion. Pt requested use of urinal seated EOB. Pt able to unzip pants and place urinal appropriately while seated EOB. Unsuccessful with voiding. Sit<>stand with max A+2 using Stedy. Transfer to w/c with Stedy. Grooming at sink. Pt backed w/c next to bed. Belt alarm activated. All needs within reach.   Therapy Documentation Precautions:  Precautions Precautions: Fall, Back Precaution Comments: spinal Restrictions Weight Bearing Restrictions: No   Pain:  Pt c/o 4/10 back pain; repositioned, meds admin prior to therapy   Therapy/Group: Individual Therapy  Rich Brave 03/29/2023, 2:33 PM

## 2023-03-29 NOTE — Progress Notes (Signed)
Occupational Therapy Session Note  Patient Details  Name: Raymond [name] MRN: [id number] Date of Birth: 1965-11-24  Today's Date: 03/29/2023 OT Individual Time: 0915-1000 OT Individual Time Calculation (min): 45 min    Short Term Goals: Week 1:  OT Short Term Goal 1 (Week 1): Pt will transfer to [facility] with LRAD and mod A OT Short Term Goal 2 (Week 1): Pt will complete LB self care with AE with mod A OT Short Term Goal 3 (Week 1): Pt will stand with mod A with LRAD up to 1 min  Skilled Therapeutic Interventions/Progress Updates:      Therapy Documentation Precautions:  Precautions Precautions: Fall, Back Precaution Comments: spinal Restrictions Weight Bearing Restrictions: No General: "My grandson is the best." Pt seated in W/C upon OT arrival, agreeable to OT. Session focused on ADL retraining with toileting.  Pain: no pain reported  ADL: Toilet transfers: stedy +2, Pt performed sit to stand from W/C to stedy and dependently taken on stedy to toilet, VC for slow decent to sit on bariatric commode Toileting: Max A, pt able to perform hygiene after BM with lateral lean with support on stedy in front of him, separate stands in order to complete toileting tasks d/t low endurance. OT assisted with pants management in standing at stedy. Increased time to complete overall   Pt seated in W/C at end of session with W/C alarm donned, call light within reach and 4Ps assessed.    Therapy/Group: Individual Therapy  Velia Meyer, OTD, OTR/L 03/29/2023, 12:47 PM

## 2023-03-29 NOTE — Plan of Care (Signed)
  Problem: Consults Goal: RH SPINAL CORD INJURY PATIENT EDUCATION Description:  See Patient Education module for education specifics.  Outcome: Progressing   Problem: SCI BOWEL ELIMINATION Goal: RH STG MANAGE BOWEL WITH ASSISTANCE Description: STG Manage Bowel with min/mod Assistance. Outcome: Progressing   Problem: SCI BOWEL ELIMINATION Goal: RH STG SCI MANAGE BOWEL WITH MEDICATION WITH ASSISTANCE Description: STG SCI Manage bowel with medication with min assistance. Outcome: Progressing

## 2023-03-29 NOTE — Progress Notes (Signed)
[facility] Progress Note   50M with HTN, HLD, DM II, obesity, gout, and admission August 2024 for poor functional status and anasarca. 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  On 10/10, Cr was 1.35.  Was 2.09 n10/22/24--> 2.07 03/25/23 --> 2.30 today, prompting evaluation.    Assessment/ Plan:   AKI on CKD: possible obstruction vs overcontrolled BP             - check UA and UP/C--> on Keflex for UTI             - stopped amlodipine -> BP better today (120-140's). Will hold off on any more changes with other antihypertensives. Renal function appears to be stabilizing. Will follow closely with you.             - lasix has already been reduced             - renal US -> perinephric fluid but no e/o obstruction             - close attn to baclofen and PPI             - on losartan too and ok to continue; renal function continues to improve.  Signing off at this time; please reconsult as needed.    2.  Thoracic myelopathy:             - in rehab   3.  HTN:              - amlodipine stopped             - on cardura, carvedilol, losartan, Lasix  Subjective:   Denies f/c/n/v/sob. Appetite ok.   Objective:   BP 138/71 (BP Location: Left Arm)   Pulse 73   Temp 97.7 F (36.5 C) (Oral)   Resp 18   Ht 5\' 11"  (1.803 m)   Wt (!) 142.8 kg   SpO2 95%   BMI 43.91 kg/m   Intake/Output Summary (Last 24 hours) at 03/29/2023 0817 Last data filed at 03/29/2023 0734 Gross per 24 hour  Intake 1197 ml  Output 2550 ml  Net -1353 ml   Weight change: -0.1 kg  Physical Exam: GEN sitting in wheelchair, doing rehab HEENT EOMI  NECK no JVD PULM clear CV RRR ABD soft EXT 2+ LE edema, TEDS hose NEURO AAO x 3  Imaging: No results found.  Labs: BMET Recent Labs  Lab 03/24/23 0730 03/25/23 0454 03/26/23 0559  03/27/23 0649 03/28/23 1139  NA 139 134* 136 133* 136  K 4.6 3.6 4.4 4.3 4.4  CL 96* 98 98 98 98  CO2 27 27 27 25 26   GLUCOSE 104* 99 121* 126* 61*  BUN 33* 37* 40* 41* 44*  CREATININE 2.09* 2.07* 2.30* 2.27* 2.17*  CALCIUM 10.3 9.6 9.6 9.4 9.5   CBC Recent Labs  Lab 03/24/23 0730  WBC 12.4*  NEUTROABS 8.4*  HGB 11.2*  HCT 34.7*  MCV 90.1  PLT 350    Medications:     allopurinol  300 mg Oral Daily   apixaban  10 mg Oral BID   Followed by   Melene Muller ON 04/01/2023] apixaban  5 mg Oral BID   atorvastatin  40 mg Oral Daily   baclofen  5 mg Oral TID   carvedilol  25 mg Oral BID WC   doxazosin  2 mg  Oral QHS   DULoxetine  60 mg Oral Daily   furosemide  10 mg Oral BID   glipiZIDE  10 mg Oral QAC breakfast   lidocaine  2 patch Transdermal Q2200   losartan  50 mg Oral Daily   magnesium gluconate  250 mg Oral QHS   metFORMIN  1,000 mg Oral Q breakfast   nitrofurantoin (macrocrystal-monohydrate)  100 mg Oral Q12H   oxyCODONE  10 mg Oral TID   pantoprazole  80 mg Oral Daily   pramipexole  0.5 mg Oral BID   senna-docusate  2 tablet Oral q morning   tamsulosin  0.4 mg Oral QHS   tirzepatide  10 mg Subcutaneous Weekly      Paulene Floor, MD 03/29/2023, 8:17 AM

## 2023-03-29 NOTE — Progress Notes (Signed)
Physical Therapy Session Note  Patient Details  Name: Raymond [name] MRN: [id number] Date of Birth: September 10, 1965  Today's Date: 03/29/2023 PT Individual Time: 1349-1446 PT Individual Time Calculation (min): 57 min   Short Term Goals: Week 1:  PT Short Term Goal 1 (Week 1): Pt will perform bed mobility with consistent MinA and abiding by spinal precautions. PT Short Term Goal 2 (Week 1): Pt will perform sit<>stand from elevated height with knee guard and ModA +2 PT Short Term Goal 3 (Week 1): Pt will perform slide board transfers in either direction with MinA/ CGA. PT Short Term Goal 4 (Week 1): Pt will propel w/c >25 ft with MinA.  Skilled Therapeutic Interventions/Progress Updates:      Therapy Documentation Precautions:  Precautions Precautions: Fall, Back Precaution Comments: spinal Restrictions Weight Bearing Restrictions: No  Pt agreeable to PT session with emphasis on sit<>stand transfer and global strength training. Pt with unrated left low back pain, provided STM paraspinals and seated stretching for QL. Pt max A x 2 with stedy transfer from w/c to elevated mat table. Pt engaged in blocked practice of yoga block push ups with max multimodal cues for head/hips relationship, momentum and forward flexion. Pt able to progress to gluteal clearance with increased repetitions. PT replaced yoga blocks with 6 inch steps and pt able to perform sit<>stand x 2 (max A x 2). PT provided anterior bilateral knee block and assistance with forward trunk flexion while therapy tech provided assistance posterior to patient. Pt encouraged by progress and excited to be able to stand. Pt returned by stedy max A x 2 and left seated at bedside with all needs in reach and alarm on.    Therapy/Group: Individual Therapy  Truitt Leep Truitt Leep PT, DPT  03/29/2023, 3:36 PM

## 2023-03-29 NOTE — Progress Notes (Signed)
PROGRESS NOTE   Subjective/Complaints: No new complaints this morning Denies edema Repeat BMP ordered for today. Discussed that creatinine improved yesterday  ROS: +impaired ambulation, +chronic back pain, +bilateral knee buckling, +stool incontinence, denies lower extremity edema   Objective:   No results found. No results for input(s): "WBC", "HGB", "HCT", "PLT" in the last 72 hours.  Recent Labs    03/27/23 0649 03/28/23 1139  NA 133* 136  K 4.3 4.4  CL 98 98  CO2 25 26  GLUCOSE 126* 61*  BUN 41* 44*  CREATININE 2.27* 2.17*  CALCIUM 9.4 9.5    Intake/Output Summary (Last 24 hours) at 03/29/2023 1219 Last data filed at 03/29/2023 0920 Gross per 24 hour  Intake 957 ml  Output 2375 ml  Net -1418 ml        Physical Exam: Vital Signs Blood pressure 138/71, pulse 73, temperature 97.7 F (36.5 C), temperature source Oral, resp. rate 18, height 5\' 11"  (1.803 m), weight (!) 142.8 kg, SpO2 95%. Gen: no distress, normal appearing HEENT: oral mucosa pink and moist, NCAT Cardio: Reg rate Chest:     Chest wall: Tenderness (left lateral chest) present.  Abdominal:     General: Bowel sounds are normal. There is no distension.     Palpations: Abdomen is soft.     Tenderness: There is no abdominal tenderness.  Musculoskeletal:        General: Tenderness (back) present. Normal range of motion.     Cervical back: Normal range of motion.     Right lower leg: Edema (trace) present.     Left lower leg: Left lower leg edema: trace.  Skin:    General: Skin is warm and dry.     Comments: Back incision C/D/Intact. Dry scab on left shin. Stasis changes BLE.   Neurological:     Mental Status: He is alert.     Comments: Alert and oriented x 3. Normal insight and awareness. Intact Memory. Normal language and speech. Cranial nerve exam unremarkable. MMT: UE 5/5 bilaterally. RLE 4-/5 HF, KE and 4/5 ADF/PF.  LLE 4/5 HF,KE,  4+ADF/PF. Pt senses pain and light touch equally on all 4's.  DTR's 3+ in both LE's. 3-4 beats of clonus each foot. Mild extensor tone in LE's, knees buckling on standing attempts, exam stable 10/27 Psychiatric:        Mood and Affect: Mood normal.        Behavior: Behavior normal.        Thought Content: Thought content normal.    Assessment/Plan: 1. Functional deficits which require 3+ hours per day of interdisciplinary therapy in a comprehensive inpatient rehab setting. Physiatrist is providing close team supervision and 24 hour management of active medical problems listed below. Physiatrist and rehab team continue to assess barriers to discharge/monitor patient progress toward functional and medical goals  Care Tool:  Bathing    Body parts bathed by patient: Right arm, Left arm, Chest, Abdomen   Body parts bathed by helper: Front perineal area, Buttocks, Right upper leg, Left upper leg, Right lower leg, Left lower leg     Bathing assist Assist Level: Maximal Assistance - Patient 24 - 49%  Upper Body Dressing/Undressing Upper body dressing   What is the patient wearing?: Pull over shirt    Upper body assist Assist Level: Supervision/Verbal cueing    Lower Body Dressing/Undressing Lower body dressing      What is the patient wearing?: Pants     Lower body assist Assist for lower body dressing: Total Assistance - Patient < 25%     Toileting Toileting    Toileting assist Assist for toileting: 2 Helpers     Transfers Chair/bed transfer  Transfers assist     Chair/bed transfer assist level: 2 Helpers     Locomotion Ambulation   Ambulation assist   Ambulation activity did not occur: Safety/medical concerns          Walk 10 feet activity   Assist  Walk 10 feet activity did not occur: Safety/medical concerns        Walk 50 feet activity   Assist Walk 50 feet with 2 turns activity did not occur: Safety/medical concerns         Walk 150  feet activity   Assist Walk 150 feet activity did not occur: Safety/medical concerns         Walk 10 feet on uneven surface  activity   Assist Walk 10 feet on uneven surfaces activity did not occur: Safety/medical concerns         Wheelchair     Assist Is the patient using a wheelchair?: Yes Type of Wheelchair: Manual    Wheelchair assist level: Supervision/Verbal cueing Max wheelchair distance: 80    Wheelchair 50 feet with 2 turns activity    Assist        Assist Level: Supervision/Verbal cueing   Wheelchair 150 feet activity     Assist      Assist Level: Supervision/Verbal cueing   Blood pressure 138/71, pulse 73, temperature 97.7 F (36.5 C), temperature source Oral, resp. rate 18, height 5\' 11"  (1.803 m), weight (!) 142.8 kg, SpO2 95%.  Medical Problem List and Plan: 1. 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 Pt s/p T10 laminectomy and discectomy on 03/12/23.              -patient Frink shower             -ELOS/Goals: 17-24 days, goals supervision to min assist at w/c level             -Bilateral PRAFO's  Conitnue CIR  Grounds pass ordered 2.  Multiple DVTs: Eliquis started             -antiplatelet therapy: N/A 3. Pain Management: Oxycodone prn--> has been using IV dilaudid couple of times a day. Kpad ordered             --He feels that dilaudid works better but oxycodone last longer. Will schedule oxycodone 10 mg TID for now              --Wean down as pain/activity improves.    4. Mood/Behavior/Sleep: LCSW to follow for evaluation and support.              -antipsychotic agents: N/A 5. Neuropsych/cognition: This patient is capable of making decisions on his own behalf. 6. Skin/Wound Care: back incision is CDI. [place] be left open to air  7. Fluids/Electrolytes/Nutrition: Monitor I/O. Check CMET in am  8. Spasticity: slightly improved with addition of low dose baclofen last week. Increase baclofen to  5mg  TID for now 9. HTN: Monitor BS TID--continue Amlodipine,  Coreg, Cardura and Lasix bid             --labile with SBP ranging 130-160 during the day. Monitor for orthostatic symptoms.  10.  OSA: CPAP at nights-->Is compliant with his machine and advised him to ask his wife to bring it from home. 11. T2DM: Hgb A1C- 6.0 and well controlled.  D/c CBGs and insulin.  BS stable on glucotrol and metformin daily in am.  --monitor for changes with increase in activity.  10. COPD: Has intermittent cough " from 40 yrs of smoking"             --resume MDI. 11. Hypokalemia: Likely due to diuretic/intake. Will continue K dur  30 meq BID --K- 3.3 on 10/09 (admission)-->recheck BMET in am 12. CKD: Baseline SCr around 1.3--creatinine reviewed and improved, discussed with patient, consulted nephrology, lasix decreased to 20mg  BID, encouraged 6-8 glasses of water daily and repeat BMP tomorrow. Advised not to use Goodys or Mobic. Amlodipine d/ced Lasix decreased to 10mg  BID given AKI and lower weights, repeat BMP today  13. Leucocytosis: Monitor for fevers and other signs of infection. WBC was up to 13.3 after surgery.             --recheck WBC in am 14. Acute on chronic anemia: Recheck H/H in am 15. Constipation/neurogenic bowel.   -- On Senna 1 bid--> change to 2 in am. Sorbitol 45 cc tonight. -dulcolax supp in pm -bowel program d/ced as he is having bowel movements on own and is able to sense them  16.  Neurogenic bladder: Continue Flomax and Cardura. Foley was placed at admission             -continue foley tonight.  -Voiding trial in AM  17. Morbid obesity: BMI -44. Was 317 at admission and down to 315 lbs. He would like to lose more weight.  --Will need ongoing education on diet as well as importance of weight loss to promote mobility and wound healing  18.  Anxiety d/o: Continue Cymbalta with prn Xanax. Magnesium added given suboptimal levels  19.  Left chest wall/rib pain: Has been ongoing  since 4 days and feels that is positional. Lidocaine patches at night as pain worse then.  CXR reviewed and shows no fracture/infection  20. Hypoalbuminemia: encouraged prioritizing protein in diet  21. ESBL: continue nitrofurantoin  22. Polypharmacy: decrease lasix to 10mg  BID given decreased weight and AKI on CKD, examined lower extremities and edema remains stable    LOS: 6 days A FACE TO FACE EVALUATION WAS PERFORMED  Clint Bolder P Jaxsun Ciampi 03/29/2023, 12:19 PM

## 2023-03-29 NOTE — Progress Notes (Signed)
Occupational Therapy Session Note  Patient Details  Name: Raymond [name] MRN: [id number] Date of Birth: 10-Jul-1965  Today's Date: 03/29/2023 OT Individual Time: 0700-0756 OT Individual Time Calculation (min): 56 min    Short Term Goals: Week 1:  OT Short Term Goal 1 (Week 1): Pt will transfer to [facility] with LRAD and mod A OT Short Term Goal 2 (Week 1): Pt will complete LB self care with AE with mod A OT Short Term Goal 3 (Week 1): Pt will stand with mod A with LRAD up to 1 min  Skilled Therapeutic Interventions/Progress Updates:    Pt resting in bed upon arrival. OT intervention with focus on bed mobility, sitting balance, sit<>stand in [place], UB bathing, and activity tolerance to increase independence with BADLs. Supine>sit EOB with min A using bed rails with HOB elevated. UB dressing with supervision. Pt required assitance threading pants into BLE. Sit<>stand in [place] with bed elevated with max A+2. Assistance to pull pants over hips. Grooming at sink with setup. Pt remained seated in w/c with all needs within reach. Belt alarm activated.   Therapy Documentation Precautions:  Precautions Precautions: Fall, Back Precaution Comments: spinal Restrictions Weight Bearing Restrictions: No   Pain: Pt c/o 5/10 back pain; meds admin during session and repositioned  Therapy/Group: Individual Therapy  Rich Brave 03/29/2023, 7:59 AM

## 2023-03-30 DIAGNOSIS — I10 Essential (primary) hypertension: Secondary | ICD-10-CM | POA: Diagnosis not present

## 2023-03-30 DIAGNOSIS — N179 Acute kidney failure, unspecified: Secondary | ICD-10-CM

## 2023-03-30 DIAGNOSIS — K5900 Constipation, unspecified: Secondary | ICD-10-CM | POA: Diagnosis not present

## 2023-03-30 DIAGNOSIS — N189 Chronic kidney disease, unspecified: Secondary | ICD-10-CM

## 2023-03-30 DIAGNOSIS — M4714 Other spondylosis with myelopathy, thoracic region: Secondary | ICD-10-CM | POA: Diagnosis not present

## 2023-03-30 LAB — BASIC METABOLIC PANEL
Anion gap: 8 (ref 5–15)
BUN: 46 mg/dL — ABNORMAL HIGH (ref 6–20)
CO2: 26 mmol/L (ref 22–32)
Calcium: 9.1 mg/dL (ref 8.9–10.3)
Chloride: 101 mmol/L (ref 98–111)
Creatinine, Ser: 2.1 mg/dL — ABNORMAL HIGH (ref 0.61–1.24)
GFR, Estimated: 36 mL/min — ABNORMAL LOW (ref >60)
Glucose, Bld: 110 mg/dL — ABNORMAL HIGH (ref 70–99)
Potassium: 3.7 mmol/L (ref 3.5–5.1)
Sodium: 135 mmol/L (ref 135–145)

## 2023-03-30 MED ORDER — TIZANIDINE HCL 4 MG PO TABS
2.0000 mg | ORAL_TABLET | Freq: Three times a day (TID) | ORAL | Status: DC | PRN
  Administered 2023-03-30 – 2023-04-06 (×9): 2 mg via ORAL
  Filled 2023-03-30 (×9): qty 1

## 2023-03-30 MED ORDER — DICLOFENAC SODIUM 1 % EX GEL
4.0000 g | Freq: Four times a day (QID) | CUTANEOUS | Status: DC
  Administered 2023-03-30 – 2023-04-09 (×28): 4 g via TOPICAL
  Filled 2023-03-30: qty 100

## 2023-03-30 NOTE — Plan of Care (Signed)
  Problem: Consults Goal: RH SPINAL CORD INJURY PATIENT EDUCATION Description:  See Patient Education module for education specifics.  Outcome: Progressing   Problem: SCI BOWEL ELIMINATION Goal: RH STG MANAGE BOWEL WITH ASSISTANCE Description: STG Manage Bowel with min/mod Assistance. Outcome: Progressing   Problem: SCI BOWEL ELIMINATION Goal: RH STG SCI MANAGE BOWEL WITH MEDICATION WITH ASSISTANCE Description: STG SCI Manage bowel with medication with min assistance. Outcome: Progressing

## 2023-03-30 NOTE — Consult Note (Signed)
Neuropsychological Consultation Comprehensive Inpatient Rehab   Patient:   Raymond [name]   DOB:   11-Apr-1966  MR Number:  [id number]  Location:  [facility] [address] CENTER A [address] [place] [address] Dept: [phone] Loc: [phone]           Date of Service:   03/30/2023  Start Time:   9 AM End Time:   10 AM  Provider/Observer:  Arley Phenix, Psy.D.       Clinical Neuropsychologist       Billing Code/Service: [id number]  Reason for Service:    Raymond [name] is a 57 year old male referred for neuropsychological consultation during his ongoing admission into the comprehensive inpatient rehabilitation unit.  Patient has a past medical history including COPD, type 2 diabetes, obstructive sleep apnea, tobacco use, fibula fracture in August, chronic kidney disease, super morbid obesity.  Patient was admitted on 03/11/2023 with lower extremity weakness with gait disorder, urinary incontinence and decreased coordination.  Workup indicated the patient had a large herniated nucleus pulposus at T10/T11 and underwent T10 laminectomy with discectomy on 10/9.  Postoperatively, the patient had poor pain control.  Sensation bilateral feet and ankle have been improving but the patient required moderate assistance with steady to stand and assistance with ADLs.  Patient had been independent prior to having to stop work during the summer and was increasingly debilitated.  Patient was admitted to CIR due to functional decline.  Today, patient was oriented and reports that his mood was improved.  He was sitting up in wheelchair and reported he was pleased that he was able to stand for first time during transfer.  He reported it was lonely at times when family not around but his mood has improved.  He hopes that he will be able to stand better for easier transfers and Raymond [name] one day be able to walk some.  Continued to work on coping issues.  Patient has  gotten his BiPAP from home and has been using it every night for past few nights.  Reports good sleep last night.  HPI for the current admission:    HPI: Raymond [name] is a 57 year old male with history of COPD, T2DM, OSA, tobacco use, fibula fx/FTT 01/2023, CKD 111a, morbid obesity who was admitted on 03/11/23 with BLE weakness with gait disorder, urinary incontinence and decreased coordination BLE. He ws found to have large HNP at T10/T11 and underwent T10 laminectomy with diskectomy by Dr. Franky Macho on 09/10. Post op he had issues with poor pain control and has been using IV dilaudid with oxycodone prn. Foley placed in ED and remains in place. Po intake has been good but has not had BM for 3 days and usually goes daily. Has been refusing CPAP but compliant at home. Sensation bilateral feet and ankle are improving. PT/OT has been working with patient who requires Mod assist with Stedy to stand and min assist with ADLs. He was independent and working prior to this summer. CIR recommended due to functional decline.   Medical History:   Past Medical History:  Diagnosis Date   Anxiety    COPD (chronic obstructive pulmonary disease) (HCC)    Diabetes mellitus without complication (HCC)    Diverticulosis    with hx of LGIB   Edema    GERD (gastroesophageal reflux disease)    GIB (gastrointestinal bleeding)    Gout    Heavy smoker    Hyperlipidemia  Hypertension    Iron deficiency anemia    Sleep apnea    uses a cpap         Patient Active Problem List   Diagnosis Date Noted   Coping style affecting medical condition 03/27/2023   Thoracic myelopathy 03/23/2023   HNP (herniated nucleus pulposus with myelopathy), thoracic 03/12/2023   Hyperlipidemia 12/24/2022   Failure to thrive in adult 12/24/2022   Tobacco abuse 03/18/2018   BPH associated with nocturia 03/11/2017   History of substance abuse (HCC) 03/11/2017   Restless leg syndrome 03/11/2017   Restrictive lung disease 05/14/2016    OSA on CPAP 05/14/2016   Moderate COPD (chronic obstructive pulmonary disease) (HCC) 05/14/2016   Smoking greater than 40 pack years 05/14/2016   SOB (shortness of breath) 10/06/2014   Edema of extremities 10/06/2014   Benign essential HTN 10/06/2014   Type 2 diabetes mellitus, without long-term current use of insulin (HCC) 07/18/2011    Behavioral Observation/Mental Status:   Raymond [name]  presents as a 57 y.o.-year-old Right handed Caucasian Male who appeared his stated age. his dress was Appropriate and he was Well Groomed and his manners were Appropriate to the situation.  his participation was indicative of Appropriate and Attentive behaviors.  There were physical disabilities noted.  he displayed an appropriate level of cooperation and motivation.    Interactions:    Active Appropriate  Attention:   within normal limits and attention span and concentration were age appropriate  Memory:   within normal limits; recent and remote memory intact  Visuo-spatial:   not examined  Speech (Volume):  normal  Speech:   normal; normal  Thought Process:  Coherent, Logical, and Oriented  Though Content:  WNL; not suicidal and not homicidal  Orientation:   person, place, time/date, and situation  Judgment:   Fair  Planning:   Fair  Affect:    Appropriate  Mood:    Euthymic  Insight:   Good  Intelligence:   normal  Psychiatric History:  No prior psychiatric history  Family Med/Psych History:  Family History  Problem Relation Age of Onset   Diabetes Mother    Hypertension Mother    Diabetes Father    Hypertension Father    Cancer Other     Impression/DX:   Raymond [name] is a 57 year old male referred for neuropsychological consultation during his ongoing admission into the comprehensive inpatient rehabilitation unit.  Patient has a past medical history including COPD, type 2 diabetes, obstructive sleep apnea, tobacco use, fibula fracture in August, chronic kidney disease,  super morbid obesity.  Patient was admitted on 03/11/2023 with lower extremity weakness with gait disorder, urinary incontinence and decreased coordination.  Workup indicated the patient had a large herniated nucleus pulposus at T10/T11 and underwent T10 laminectomy with discectomy on 10/9.  Postoperatively, the patient had poor pain control.  Sensation bilateral feet and ankle have been improving but the patient required moderate assistance with steady to stand and assistance with ADLs.  Patient had been independent prior to having to stop work during the summer and was increasingly debilitated.  Patient was admitted to CIR due to functional decline.  Today, patient was oriented and reports that his mood was improved.  He was sitting up in wheelchair and reported he was pleased that he was able to stand for first time during transfer.  He reported it was lonely at times when family not around but his mood has improved.  He hopes that he will be  able to stand better for easier transfers and Well one day be able to walk some.  Continued to work on coping issues.  Patient has gotten his BiPAP from home and has been using it every night for past few nights.  Reports good sleep last night.           Electronically Signed   _______________________ Arley Phenix, Psy.D. Clinical Neuropsychologist

## 2023-03-30 NOTE — Progress Notes (Signed)
Occupational Therapy Session Note  Patient Details  Name: Raymond [name] MRN: [id number] Date of Birth: February 14, 1966  Today's Date: 03/30/2023 OT Individual Time: 1000-1100 OT Individual Time Calculation (min): 60 min    Short Term Goals: Week 1:  OT Short Term Goal 1 (Week 1): Pt will transfer to [facility] with LRAD and mod A OT Short Term Goal 2 (Week 1): Pt will complete LB self care with AE with mod A OT Short Term Goal 3 (Week 1): Pt will stand with mod A with LRAD up to 1 min  Skilled Therapeutic Interventions/Progress Updates:      Therapy Documentation Precautions:  Precautions Precautions: Fall, Back Precaution Comments: spinal Restrictions Weight Bearing Restrictions: No General: "This leg keeps bending on me" Pt seated in W/C upon OT arrival, agreeable to OT.  Pain: no pain reported  ADL: Pt attempted to complete sit to stand transfer from W/C>stedy to complete shower per pt request. Pt requiring 3x trials for standing with +2 assistance with noted LLE spasms with knee flexion during activity and at rest. Pt reporting spasms are new since last night/this morning. MD notified. Pt then attempted to stand 3x more with +2 assistance in order to transfer into bed. Pt with LLE spasms throughout attempts to stand in stedy. Pt required VC for breathing during activity d/t tendency to hold breath. Pt completed UB dressing SBA seated EOB and total A for doffing TED hose and shoes.  Exercises: Pt completed 3x10 bed level heel slides on bed with occasional assistance required for knee flexion with Lt leg.  Other Treatments:    Pt supine in bed with bed alarm activated, 2 bed rails up, call light within reach and 4Ps assessed.   Therapy/Group: Individual Therapy  Raymond [name], OTD, OTR/L 03/30/2023, 12:35 PM

## 2023-03-30 NOTE — Progress Notes (Signed)
Occupational Therapy Session Note  Patient Details  Name: Raymond [name] MRN: [id number] Date of Birth: 1965/12/02  Today's Date: 03/30/2023 OT Individual Time: 1478-2956 & 2130-8657 OT Individual Time Calculation (min): 56 min & 70 min   Short Term Goals: Week 1:  OT Short Term Goal 1 (Week 1): Pt will transfer to [facility] with LRAD and mod A OT Short Term Goal 2 (Week 1): Pt will complete LB self care with AE with mod A OT Short Term Goal 3 (Week 1): Pt will stand with mod A with LRAD up to 1 min  Skilled Therapeutic Interventions/Progress Updates:  Session 1 Skilled OT intervention completed with focus on activity tolerance, sit > stands, functional transfers. Pt received upright in bed, agreeable to session. 4/10 pain reported in back in thoracic region; pre-medicated. OT offered rest breaks, repositioning throughout for pain reduction.  Pt requesting to shower during PM session. Donned knee high TEDs with total A, with OT providing education on DVTs and intervention (pt on blood thinners), per pt questions regarding the status. Able to recall 2/3 spinal precautions, with cues needed for avoiding lifting. Able to transition to EOB with close supervision, using bed rails and elevated HOB with good adherence to precautions. Sat EOB with supervision for face washing and oral care, UB bathing/dressing without LOB.   Attempted 3 sit > stands in [place] with max A +2, however despite elevated bed, pt with difficulty powering up and lacking hip extension to place seat flaps due to bilateral knee pain with both knees increasing in flexion with BLE transitioning to far underneath him from a "spasm." Tried to clarify what he meant by spasm but remained unclear why this occurs. MD present and notified of request for Voltaren to assist with knee pain.  Transitioned to slideboard transfer to off load knees. Pt able to lean with CGA, for total A board placement, then with one step commands, and physical assist  for BLE placement, pt able to transfer on board with min/mod A +2 to w/c. Pt continuing to lack anterior weight shifting and clearance of buttocks as well as awareness of BLE due to sensory impairment.  Seated in w/c, pt shaved face with electric razor with set up A. Pt remained seated in w/c, with belt alarm on/activated, and with all needs in reach at end of session.  Session 2 Skilled OT intervention completed with focus on SCI/etiology education, activity tolerance, sitting balance. Pt received semi supine in bed, agreeable to session however reporting BLE spasms with 7/10 pain; nurse notified of pain med request, and present towards start of session. OT offered rest breaks, repositioning throughout for pain reduction.  While supine at rest pt with demo of BLE spasms predominantly RLE knee flexion, and bilateral toe extension. Wife present reports pt has not had spasms at rest since prior to surgery, but this OT stated that this AM and with other OT that pt has had increase in spasms within last couple of days. Extensive time spent discussing SCI healing, how a UTI can cause increase in spasms/symptoms, as well as trying to determine adequate medication regimen/intervention. MD made aware of status, with OT advising that shower not appropriate at this time due to earlier episode of bilateral knee flexion with previous OT in stedy and complexity/safety implications with getting pt in/out of shower. Will attempt another date when pain/spasms more controlled; pt receptive.  Pt transitioned to EOB with supervision with good adherence to log roll technique, with heavy use of bed  rails for assist. Pt able to sit statically and dynamically with without LOB with overall supervision, for the following activities to address postural strengthening/stability, BUE strength and activity tolerance needed for BADLS: -2x15 chest press with 3 lb dowel -tapping beach ball with 3 lb dowel x20 -2x15 bicep curls, RUE 3 lb  dumbbell, LUE 5 pound dumbbell -tossing/catching beach ball  Pt required 2 separate attempts to use urinal for continent void with > 200 mL emptied. No assist for use of urinal. While sitting EOB, pts legs noted to continue with spasms, therefore safety plan updated/nursing notified of bed level toileting recommendation for safety at current time with plan to reassess.   Pt able to transition supine with supervision with increased effort, then +2 needed to boost > HOB. Pt remained semi supine in bed, with bed alarm on/activated, and with all needs in reach at end of session.   Therapy Documentation Precautions:  Precautions Precautions: Fall, Back Precaution Comments: spinal Restrictions Weight Bearing Restrictions: No    Therapy/Group: Individual Therapy  Melvyn Novas, MS, OTR/L  03/30/2023, 3:51 PM

## 2023-03-30 NOTE — Progress Notes (Addendum)
PROGRESS NOTE   Subjective/Complaints: No acute events noted overnight.  He has been indicating bilateral knee pain to therapy team.  Reports pain is under control.   ROS: +impaired ambulation, +chronic back pain, +bilateral knee buckling, +stool incontinence, denies lower extremity edema, denies abdominal pain, denies shortness of breath   Objective:   No results found. No results for input(s): "WBC", "HGB", "HCT", "PLT" in the last 72 hours.  Recent Labs    03/29/23 1136 03/30/23 0745  NA 137 135  K 4.1 3.7  CL 100 101  CO2 25 26  GLUCOSE 92 110*  BUN 47* 46*  CREATININE 2.10* 2.10*  CALCIUM 9.6 9.1    Intake/Output Summary (Last 24 hours) at 03/30/2023 1711 Last data filed at 03/30/2023 1537 Gross per 24 hour  Intake 1197 ml  Output 1875 ml  Net -678 ml        Physical Exam: Vital Signs Blood pressure (!) 141/85, pulse 70, temperature 97.9 F (36.6 C), temperature source Oral, resp. rate 19, height 5\' 11"  (1.803 m), weight (!) 142.7 kg, SpO2 96%. Gen: no distress, normal appearing HEENT: oral mucosa pink and moist, NCAT Cardio: RRR Pulmonary:     CTAB, normal effort Chest:     Chest wall: Tenderness (left lateral chest) present.  Abdominal:     General: Bowel sounds are normal. There is no distension.     Palpations: Abdomen is soft.     Tenderness: There is no abdominal tenderness.  Musculoskeletal:        General: Tenderness (back) present. Normal range of motion.     Cervical back: Normal range of motion.     Right lower leg: Edema 1+ present.     Left lower leg: Left lower leg edema: trace.  Skin:    General: Skin is warm and dry.     Comments: Back incision C/D/Intact. Dry scab on left shin. Stasis changes BLE.   Neurological:     Mental Status: He is alert.     Comments: Alert and oriented x 3. Normal insight and awareness. Intact Memory. Normal language and speech. Cranial nerve exam  unremarkable. MMT: UE 5/5 bilaterally. RLE 4-/5 HF, KE and 4/5 ADF/PF.  LLE 4/5 HF,KE, 4+ADF/PF. Pt senses pain and light touch equally on all 4's.  DTR's 3+ in both LE's. 3-4 beats of clonus each foot. Mild extensor tone in LE's, knees buckling on standing attempts, exam stable 10/27 Psychiatric:        Mood and Affect: Mood normal.        Behavior: Behavior normal.        Thought Content: Thought content normal.    Assessment/Plan: 1. Functional deficits which require 3+ hours per day of interdisciplinary therapy in a comprehensive inpatient rehab setting. Physiatrist is providing close team supervision and 24 hour management of active medical problems listed below. Physiatrist and rehab team continue to assess barriers to discharge/monitor patient progress toward functional and medical goals  Care Tool:  Bathing    Body parts bathed by patient: Right arm, Left arm, Chest, Abdomen   Body parts bathed by helper: Front perineal area, Buttocks, Right upper leg, Left upper leg, Right lower  leg, Left lower leg     Bathing assist Assist Level: Maximal Assistance - Patient 24 - 49%     Upper Body Dressing/Undressing Upper body dressing   What is the patient wearing?: Pull over shirt    Upper body assist Assist Level: Supervision/Verbal cueing    Lower Body Dressing/Undressing Lower body dressing      What is the patient wearing?: Pants     Lower body assist Assist for lower body dressing: Total Assistance - Patient < 25%     Toileting Toileting    Toileting assist Assist for toileting: 2 Helpers     Transfers Chair/bed transfer  Transfers assist     Chair/bed transfer assist level: 2 Helpers     Locomotion Ambulation   Ambulation assist   Ambulation activity did not occur: Safety/medical concerns          Walk 10 feet activity   Assist  Walk 10 feet activity did not occur: Safety/medical concerns        Walk 50 feet activity   Assist Walk 50  feet with 2 turns activity did not occur: Safety/medical concerns         Walk 150 feet activity   Assist Walk 150 feet activity did not occur: Safety/medical concerns         Walk 10 feet on uneven surface  activity   Assist Walk 10 feet on uneven surfaces activity did not occur: Safety/medical concerns         Wheelchair     Assist Is the patient using a wheelchair?: Yes Type of Wheelchair: Manual    Wheelchair assist level: Supervision/Verbal cueing Max wheelchair distance: 80    Wheelchair 50 feet with 2 turns activity    Assist        Assist Level: Supervision/Verbal cueing   Wheelchair 150 feet activity     Assist      Assist Level: Supervision/Verbal cueing   Blood pressure (!) 141/85, pulse 70, temperature 97.9 F (36.6 C), temperature source Oral, resp. rate 19, height 5\' 11"  (1.803 m), weight (!) 142.7 kg, SpO2 96%.  Medical Problem List and Plan: 1. 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 Pt s/p T10 laminectomy and discectomy on 03/12/23.              -patient [place] shower             -ELOS/Goals: 17-24 days, goals supervision to min assist at w/c level             -Bilateral PRAFO's  Conitnue CIR  Grounds pass ordered 2.  Multiple DVTs: Eliquis started             -antiplatelet therapy: N/A 3. Pain Management: Oxycodone prn--> has been using IV dilaudid couple of times a day. Kpad ordered             --He feels that dilaudid works better but oxycodone last longer. Will schedule oxycodone 10 mg TID for now              --Wean down as pain/activity improves.    4. Mood/Behavior/Sleep: LCSW to follow for evaluation and support.              -antipsychotic agents: N/A  -Patient seen by neuropsychology  5. Neuropsych/cognition: This patient is capable of making decisions on his own behalf. 6. Skin/Wound Care: back incision is CDI. Grills be left open to air  7. Fluids/Electrolytes/Nutrition:  Monitor I/O. Check  CMET in am  8. Spasticity: slightly improved with addition of low dose baclofen last week. Increase baclofen to 5mg  TID for now 9. HTN: Monitor BS TID--continue Amlodipine,  Coreg, Cardura and Lasix bid             --labile with SBP ranging 130-160 during the day. Monitor for orthostatic symptoms.   -10/28 SBP 140-146, amlodipine was stopped by nephrology, continue current regimen    03/30/2023    2:38 PM 03/30/2023    6:32 AM 03/30/2023    4:56 AM  Vitals with BMI  Weight  314 lbs 10 oz   BMI  43.9   Systolic 141  140  Diastolic 85  80  Pulse 70  84    10.  OSA: CPAP at nights-->Is compliant with his machine and advised him to ask his wife to bring it from home.  -Patient reports he got his CPAP machine from home 11. T2DM: Hgb A1C- 6.0 and well controlled.  D/c CBGs and insulin.  BS stable on glucotrol and metformin daily in am.  --monitor for changes with increase in activity.  -CBGs controlled CBG (last 3)  Recent Labs    03/27/23 2047 03/28/23 0543  GLUCAP 85 115*    10. COPD: Has intermittent cough " from 40 yrs of smoking"             --resume MDI. 11. Hypokalemia: Likely due to diuretic/intake. Will continue K dur  30 meq BID --K- 3.3 on 10/09 (admission)-->recheck BMET in am 12.  AKI on CKD: Baseline SCr around 1.3--creatinine reviewed and improved, discussed with patient, consulted nephrology, lasix decreased to 20mg  BID, encouraged 6-8 glasses of water daily and repeat BMP tomorrow. Advised not to use Goodys or Mobic. Amlodipine d/ced Lasix decreased to 10mg  BID given AKI and lower weights, repeat BMP today  -Has been seen by nephrology, BUNs/CR stable at 46/2.1, continue current regimen for now  13. Leucocytosis: Monitor for fevers and other signs of infection. WBC was up to 13.3 after surgery.             --recheck WBC in am 14. Acute on chronic anemia: Recheck H/H in am 15. Constipation/neurogenic bowel.   -- On Senna 1 bid--> change to 2 in  am. Sorbitol 45 cc tonight. -dulcolax supp in pm -bowel program d/ced as he is having bowel movements on own and is able to sense them -Last BM 10/27  16.  Neurogenic bladder: Continue Flomax and Cardura. Foley was placed at admission             -continue foley tonight.  -Voiding trial in AM -10/28 recent continent PVRs not elevated  17. Morbid obesity: BMI -44. Was 317 at admission and down to 315 lbs. He would like to lose more weight.  --Will need ongoing education on diet as well as importance of weight loss to promote mobility and wound healing  18.  Anxiety d/o: Continue Cymbalta with prn Xanax. Magnesium added given suboptimal levels  19.  Left chest wall/rib pain: Has been ongoing since 4 days and feels that is positional. Lidocaine patches at night as pain worse then.  CXR reviewed and shows no fracture/infection  20. Hypoalbuminemia: encouraged prioritizing protein in diet  21. ESBL: continue nitrofurantoin  22. Polypharmacy: decrease lasix to 10mg  BID given decreased weight and AKI on CKD, examined lower extremities and edema remains stable   23.  Knee pain bilateral: Will start Voltaren gel.  LOS: 7 days A FACE TO  FACE EVALUATION WAS PERFORMED  Fanny Dance 03/30/2023, 5:11 PM

## 2023-03-31 ENCOUNTER — Inpatient Hospital Stay (HOSPITAL_COMMUNITY): Payer: Managed Care, Other (non HMO)

## 2023-03-31 DIAGNOSIS — M4714 Other spondylosis with myelopathy, thoracic region: Secondary | ICD-10-CM | POA: Diagnosis not present

## 2023-03-31 LAB — BASIC METABOLIC PANEL
Anion gap: 10 (ref 5–15)
BUN: 34 mg/dL — ABNORMAL HIGH (ref 6–20)
CO2: 27 mmol/L (ref 22–32)
Calcium: 9.6 mg/dL (ref 8.9–10.3)
Chloride: 100 mmol/L (ref 98–111)
Creatinine, Ser: 1.8 mg/dL — ABNORMAL HIGH (ref 0.61–1.24)
GFR, Estimated: 43 mL/min — ABNORMAL LOW (ref >60)
Glucose, Bld: 71 mg/dL (ref 70–99)
Potassium: 4.1 mmol/L (ref 3.5–5.1)
Sodium: 137 mmol/L (ref 135–145)

## 2023-03-31 MED ORDER — METHOCARBAMOL 500 MG PO TABS
500.0000 mg | ORAL_TABLET | Freq: Three times a day (TID) | ORAL | Status: DC
  Administered 2023-03-31 – 2023-04-01 (×4): 500 mg via ORAL
  Filled 2023-03-31 (×4): qty 1

## 2023-03-31 MED ORDER — FUROSEMIDE 20 MG PO TABS
20.0000 mg | ORAL_TABLET | Freq: Two times a day (BID) | ORAL | Status: DC
  Administered 2023-03-31 – 2023-04-02 (×4): 20 mg via ORAL
  Filled 2023-03-31 (×4): qty 1

## 2023-03-31 MED ORDER — CALCIUM CARBONATE ANTACID 500 MG PO CHEW
1.0000 | CHEWABLE_TABLET | Freq: Every day | ORAL | Status: DC | PRN
  Administered 2023-03-31: 200 mg via ORAL
  Filled 2023-03-31: qty 1

## 2023-03-31 NOTE — Progress Notes (Signed)
Occupational Therapy Weekly Progress Note  Patient Details  Name: Zayvier Soliven Sjogren MRN: [id number] Date of Birth: 1965-12-31  Beginning of progress report period: March 24, 2023 End of progress report period: March 31, 2023   Patient has met 0 of 3 short term goals. Pt is making slow but effortful progress towards LTGs. Pt is able to bathe at an overall mod A level, dress LB with total A and requires +2 assist for toileting tasks. Pt has recently demonstrated an increase in BLE spasms that have impacted his therapy participation as a result of pain, though has remained motivated and participatory to his fullest ability. He continues to demonstrate global deconditioning, along with awareness/cog, balance and gross motor coordination deficits resulting in difficulty completing BADL tasks without increased physical assist. Pt will benefit from continued skilled OT services to focus on mentioned deficits. Family ed not initiated as of date however will be needed in prep for DC. Goals kept at min A as pt was progressing towards goals until flare of spasms and anticipate pt can continue to progress with appropriate medical/therapy intervention.   Patient continues to demonstrate the following deficits: muscle weakness, decreased cardiorespiratoy endurance, abnormal tone and decreased coordination, decreased awareness, decreased problem solving, and decreased memory, and decreased standing balance and decreased balance strategies and therefore will continue to benefit from skilled OT intervention to enhance overall performance with BADL and Reduce care partner burden.  Patient progressing toward long term goals..  Continue plan of care.  OT Short Term Goals Week 1:  OT Short Term Goal 1 (Week 1): Pt will transfer to [facility] with LRAD and mod A OT Short Term Goal 1 - Progress (Week 1): Not met OT Short Term Goal 2 (Week 1): Pt will complete LB self care with AE with mod A OT Short Term Goal 2 - Progress  (Week 1): Progressing toward goal OT Short Term Goal 3 (Week 1): Pt will stand with mod A with LRAD up to 1 min OT Short Term Goal 3 - Progress (Week 1): Not met Week 2:  OT Short Term Goal 1 (Week 2): Pt will transfer to [facility] with LRAD and mod A OT Short Term Goal 2 (Week 2): Pt will complete LB self care with AE with mod A OT Short Term Goal 3 (Week 2): Pt will complete sit > stand in prep for ADL with max A OT Short Term Goal 4 (Week 2): Pt will recall 3/3 spinal precautions with supervision A    Ambreen Tufte E Kassidee Narciso, MS, OTR/L  03/31/2023, 8:11 AM

## 2023-03-31 NOTE — Progress Notes (Signed)
Physical Therapy Session Note  Patient Details  Name: Raymond [name] MRN: [id number] Date of Birth: [birth year]  Today's Date: 03/31/2023 PT Individual Time: 1660-6301 PT Individual Time Calculation (min): 70 min   Short Term Goals: Week 1:  PT Short Term Goal 1 (Week 1): Pt will perform bed mobility with consistent MinA and abiding by spinal precautions. PT Short Term Goal 2 (Week 1): Pt will perform sit<>stand from elevated height with knee guard and ModA +2 PT Short Term Goal 3 (Week 1): Pt will perform slide board transfers in either direction with MinA/ CGA. PT Short Term Goal 4 (Week 1): Pt will propel w/c >25 ft with MinA.  Skilled Therapeutic Interventions/Progress Updates:   Received pt semi-reclined in bed on bedpan, pt agreeable to PT treatment, and denied any pain during session. Session with emphasis on functional mobility/transfers, global strengthening and endurance, and standing tolerance. Pt rolled L/R multiple times using bedrails with supervision and therapist removed bedpan and pulled underwear/shorts over hips with max A. Donned bilateral teds with max A for edema prevention. Noted clonus in LLE>RLE that resolved with deep pressure and + Babinski. Pt also reports more noticeable decrease in sensation in LLE over the past new days - MD aware. Pt transferred semi-reclined<>sitting EOB with HOB elevated and use of bedrails with supervision and cues to adhere to spinal precautions. Donned shoes with max A and Stood in [place] from elevated EOB with max A +2 - upon sitting on [place] flaps noted BLE spasms with feet coming off footplates (flexor spasm). Pt required manual assist to keep feet in contact with footplates and stood from [facility] flaps with max A +2 and transitioned into sitting in WC. Pt sat in WC at sink and combed hair and brushed teeth with set up assist while MD arrived for morning rounds.  Pt performed WC mobility 135ft using BUE and supervision to dayroom with emphasis on UE  strength. Pt performed slideboard transfer to/from mat with CGA going to mat and light min A coming from mat with +2 on close standby. Pt required assist to place/removed board. Donned LiteGait harness seated with +2 assist. Pt stood x 2 trials in LiteGait and worked on standing tolerance, upright posture, and weight bearing through BLEs. No spasms noted in standing, however pt relying heavily on harness to hold him up. Pt's RLE "gave out" and pt hung in harness, then lowered back into sitting on EOM. Transported back to room in [facility] dependently and concluded session with pt sitting in WC, needs within reach, and seatbelt alarm on. Laboratory present at bedside.   Therapy Documentation Precautions:  Precautions Precautions: Fall, Back Precaution Comments: spinal Restrictions Weight Bearing Restrictions: No  Therapy/Group: Individual Therapy [name] Blima Rich PT, DPT 03/31/2023, 6:49 AM

## 2023-03-31 NOTE — Progress Notes (Signed)
Occupational Therapy Session Note  Patient Details  Name: Raymond [name] MRN: [id number] Date of Birth: Jun 29, 1965  Today's Date: 03/31/2023 OT Individual Time: 1102-1202 & 4098-1191 OT Individual Time Calculation (min): 60 min & 71 min   Short Term Goals: Week 2:  OT Short Term Goal 1 (Week 2): Pt will transfer to [name] with LRAD and mod A OT Short Term Goal 2 (Week 2): Pt will complete LB self care with AE with mod A OT Short Term Goal 3 (Week 2): Pt will complete sit > stand in prep for ADL with max A OT Short Term Goal 4 (Week 2): Pt will recall 3/3 spinal precautions with supervision A  Skilled Therapeutic Interventions/Progress Updates:  Session 1 Skilled OT intervention completed with focus on functional transfers, DC planning, DME education. Pt received seated in w/c, agreeable to session. 6/10 pain reported in lower back and acid reflux reported; MD notified of request for acid reducer request. OT offered rest breaks, repositioning throughout for pain reduction.  Pt declined self-care needs at current time. Transported dependently in w/c <> gym.  Pt with questions about power chair, stating "I think I would like one." Education provided about the benefits of power chair for increased independence in w/c mobility as well as decreasing burden of care, however challenges with transport chair and ensuring home accessibility at DC. Home measurement sheet issued to pt for wife to return for door way measurements and bathroom details.  Total A needed for board placement, then min/mod A +2 needed for transfer on board> Rt with 1 person assisting with BLE management and blocking knees though less assist than yesterday, with 2nd person holding board, w/c and assisting at hips. Max A needed to scoot back onto EOM.  Seated EOM, had pt practice anterior weight shifting and BLE placement to facilitate off loading of buttocks needed for efficient slideboard transfers during the following  activity: -placement/retrieval of squigz from long mirror. Cues needed for anterior lean but avoiding excessive bending for spinal precaution adherence, and utilized mirror for visual feedback to encourage pt to look at his BLE vs relying on sensation of them contacting the floor due to impaired sensation  Total A board placement, then mod A of 1 (for hips and BLE) and min A of 2nd person (for board and w/c stability) > Lt to w/c. Back in room, pt requested to return to bed to off load his back. Total A board placement, then mod A of 1 for slide board transfer > EOB with 2nd person only stabilizing w/c! Step by step cues provided to ensure slower pace for proper positioning. +2 to return pt supine. Pt remained semi supine in bed, with bed alarm on/activated, and with all needs in reach at end of session.  Session 2 Skilled OT intervention completed with focus on DME education, DC planning, functional transfers, emotional/spiritual support. Pt received upright in bed, agreeable to session. No pain reported.  Pt transitioned to EOB with supervision with heavy use of bed rail however good spinal precautions. Donned shoes total A. Total A for board placement, then min fading to CGA for transfer on board > w/c with one step commands for BLE and BUE placement, while wife stabilized w/c only.   Pt's wife with report that she would fill out home measurement sheet, which led to a conversation about her plan to buy pt a power chair. OT educated about a custom power chair and the features that would support his mobility needs, with  ability to have OT/PT present with a vendor to advocate for his needs vs just getting a standard chair. Discussed benefits of power chair to decrease caregiver burden and increase out of home mobility. Advised they would need to use transport chair or current w/c for appointments but wife and pt interested in meeting with vendor for custom chair.   OT attempted to locate power w/c for  trial however department without chair suitable for hip width. Notified PT/CSW of need to trial power chair from vendor on rehab dept in prep for DC planning with future plan to trial mobility with it.   Pt requested for grounds pass to go outside; MD notified of order request. Pt agreeable to go outside for therapy. Transported dependently in w/c <> outside courtyard.   Seated outside, pt and his wife shared their home environment details, including a paved path he could use a power chair to get outside and increase quality of life and shared personal memories. Back in room, total A needed for board placement, then CGA transfer on board with min cues with wife stabilizing w/c, > EOB. Max A bed mobility > supine. Pt remained semi upright in bed, with bed alarm on/activated, and with all needs in reach at end of session.   Therapy Documentation Precautions:  Precautions Precautions: Fall, Back Precaution Comments: spinal Restrictions Weight Bearing Restrictions: No    Therapy/Group: Individual Therapy  Raymond Novas, MS, OTR/L  03/31/2023, 3:30 PM

## 2023-03-31 NOTE — Progress Notes (Signed)
PROGRESS NOTE   Subjective/Complaints: Complains of decreased sensation in left leg, does have back pain, but it does not radiate into the leg Has spasms and therapy team says he does not ask for prns   ROS: +impaired ambulation, +chronic back pain, +bilateral knee buckling, +stool incontinence, denies lower extremity edema, denies abdominal pain, denies shortness of breath, +decreased sensation on sole of left foot   Objective:   No results found. No results for input(s): "WBC", "HGB", "HCT", "PLT" in the last 72 hours.  Recent Labs    03/29/23 1136 03/30/23 0745  NA 137 135  K 4.1 3.7  CL 100 101  CO2 25 26  GLUCOSE 92 110*  BUN 47* 46*  CREATININE 2.10* 2.10*  CALCIUM 9.6 9.1    Intake/Output Summary (Last 24 hours) at 03/31/2023 1022 Last data filed at 03/31/2023 0622 Gross per 24 hour  Intake 960 ml  Output 1150 ml  Net -190 ml        Physical Exam: Vital Signs Blood pressure (!) 167/83, pulse 67, temperature 97.8 F (36.6 C), resp. rate 17, height 5\' 11"  (1.803 m), weight (!) 142.7 kg, SpO2 93%. Gen: no distress, normal appearing HEENT: oral mucosa pink and moist, NCAT Cardio: RRR Pulmonary:     CTAB, normal effort Chest:     Chest wall: Tenderness (left lateral chest) present.  Abdominal:     General: Bowel sounds are normal. There is no distension.     Palpations: Abdomen is soft.     Tenderness: There is no abdominal tenderness.  Musculoskeletal:        General: Tenderness (back) present. Normal range of motion.     Cervical back: Normal range of motion.     Right lower leg: Edema 1+ present.     Left lower leg: Left lower leg edema: trace.  Skin:    General: Skin is warm and dry.     Comments: Back incision C/D/Intact. Dry scab on left shin. Stasis changes BLE.   Neurological:     Mental Status: He is alert.     Comments: Alert and oriented x 3. Normal insight and awareness. Intact  Memory. Normal language and speech. Cranial nerve exam unremarkable. MMT: UE 5/5 bilaterally. RLE 4-/5 HF, KE and 4/5 ADF/PF.  LLE 4/5 HF,KE, 4+ADF/PF.  DTR's 3+ in both LE's. 3-4 beats of clonus each foot. Mild extensor tone in LE's, knees buckling on standing attempts, decreased sensation in sole of left foot Psychiatric:        Mood and Affect: Mood normal.        Behavior: Behavior normal.        Thought Content: Thought content normal.    Assessment/Plan: 1. Functional deficits which require 3+ hours per day of interdisciplinary therapy in a comprehensive inpatient rehab setting. Physiatrist is providing close team supervision and 24 hour management of active medical problems listed below. Physiatrist and rehab team continue to assess barriers to discharge/monitor patient progress toward functional and medical goals  Care Tool:  Bathing    Body parts bathed by patient: Right arm, Left arm, Chest, Abdomen   Body parts bathed by helper: Front perineal area, Buttocks, Right  upper leg, Left upper leg, Right lower leg, Left lower leg     Bathing assist Assist Level: Maximal Assistance - Patient 24 - 49%     Upper Body Dressing/Undressing Upper body dressing   What is the patient wearing?: Pull over shirt    Upper body assist Assist Level: Supervision/Verbal cueing    Lower Body Dressing/Undressing Lower body dressing      What is the patient wearing?: Pants     Lower body assist Assist for lower body dressing: Total Assistance - Patient < 25%     Toileting Toileting    Toileting assist Assist for toileting: 2 Helpers     Transfers Chair/bed transfer  Transfers assist     Chair/bed transfer assist level: 2 Helpers     Locomotion Ambulation   Ambulation assist   Ambulation activity did not occur: Safety/medical concerns          Walk 10 feet activity   Assist  Walk 10 feet activity did not occur: Safety/medical concerns        Walk 50 feet  activity   Assist Walk 50 feet with 2 turns activity did not occur: Safety/medical concerns         Walk 150 feet activity   Assist Walk 150 feet activity did not occur: Safety/medical concerns         Walk 10 feet on uneven surface  activity   Assist Walk 10 feet on uneven surfaces activity did not occur: Safety/medical concerns         Wheelchair     Assist Is the patient using a wheelchair?: Yes Type of Wheelchair: Manual    Wheelchair assist level: Supervision/Verbal cueing Max wheelchair distance: 133ft    Wheelchair 50 feet with 2 turns activity    Assist        Assist Level: Supervision/Verbal cueing   Wheelchair 150 feet activity     Assist      Assist Level: Supervision/Verbal cueing   Blood pressure (!) 167/83, pulse 67, temperature 97.8 F (36.6 C), resp. rate 17, height 5\' 11"  (1.803 m), weight (!) 142.7 kg, SpO2 93%.  Medical Problem List and Plan: 1. 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 Pt s/p T10 laminectomy and discectomy on 03/12/23.              -patient Przybylski shower             -ELOS/Goals: 17-24 days, goals supervision to min assist at w/c level             -Bilateral PRAFO's  Conitnue CIR  Grounds pass ordered 2.  Multiple DVTs: Eliquis started             -antiplatelet therapy: N/A 3. Pain Management: Oxycodone prn--> has been using IV dilaudid couple of times a day. Kpad ordered             --He feels that dilaudid works better but oxycodone last longer. Will schedule oxycodone 10 mg TID for now              --Wean down as pain/activity improves.    4. Mood/Behavior/Sleep: LCSW to follow for evaluation and support.              -antipsychotic agents: N/A  -Patient seen by neuropsychology  5. Neuropsych/cognition: This patient is capable of making decisions on his own behalf. 6. Skin/Wound Care: back incision is CDI. [place] be left open to air  7.  Fluids/Electrolytes/Nutrition: Monitor I/O. Check CMET in am  8. Spasticity: slightly improved with addition of low dose baclofen last week. Increase baclofen to 5mg  TID for now 9. HTN: Monitor BS TID--continue Amlodipine,  Coreg, Cardura and Lasix bid             --labile with SBP ranging 130-160 during the day. Monitor for orthostatic symptoms.   -10/28 SBP 140-146, amlodipine was stopped by nephrology, continue current regimen    03/31/2023    3:30 AM 03/30/2023    7:59 PM 03/30/2023    2:38 PM  Vitals with BMI  Systolic 167 175 409  Diastolic 83 83 85  Pulse 67 66 70    10.  OSA: CPAP at nights-->Is compliant with his machine and advised him to ask his wife to bring it from home.  -Patient reports he got his CPAP machine from home 11. T2DM: Hgb A1C- 6.0 and well controlled.  D/c CBGs and insulin.  BS stable on glucotrol and metformin daily in am.  --monitor for changes with increase in activity.  -CBGs controlled CBG (last 3)  No results for input(s): "GLUCAP" in the last 72 hours.   10. COPD: Has intermittent cough " from 40 yrs of smoking"             --resume MDI. 11. Hypokalemia: Likely due to diuretic/intake. Will continue K dur  30 meq BID --K- 3.3 on 10/09 (admission)-->recheck BMET in am 12.  AKI on CKD: Baseline SCr around 1.3--creatinine reviewed and improved, discussed with patient, consulted nephrology, lasix decreased to 20mg  BID, encouraged 6-8 glasses of water daily and repeat BMP tomorrow. Advised not to use Goodys or Mobic. Amlodipine d/ced Lasix decreased to 10mg  BID given AKI and lower weights, repeat BMP today  -Has been seen by nephrology, BUNs/CR stable at 46/2.1, continue current regimen for now  Repeat BMP today  13. Leucocytosis: Monitor for fevers and other signs of infection. WBC was up to 13.3 after surgery.             --recheck WBC in am 14. Acute on chronic anemia: Recheck H/H in am 15. Constipation/neurogenic bowel.   -- On Senna 1 bid-->  change to 2 in am. Sorbitol 45 cc tonight. -dulcolax supp in pm -bowel program d/ced as he is having bowel movements on own and is able to sense them -Last BM 10/27  16.  Neurogenic bladder: Continue Flomax and Cardura. Foley was placed at admission             -continue foley tonight.  -Voiding trial in AM -10/28 recent continent PVRs not elevated  17. Morbid obesity: BMI -44. Was 317 at admission and down to 315 lbs. He would like to lose more weight.  --Will need ongoing education on diet as well as importance of weight loss to promote mobility and wound healing  18.  Anxiety d/o: Continue Cymbalta with prn Xanax. Magnesium added given suboptimal levels  19.  Left chest wall/rib pain: Has been ongoing since 4 days and feels that is positional. Lidocaine patches at night as pain worse then.  CXR reviewed and shows no fracture/infection  20. Hypoalbuminemia: encouraged prioritizing protein in diet  21. ESBL: continue nitrofurantoin  22. Polypharmacy: decrease lasix to 10mg  BID given decreased weight and AKI on CKD, examined lower extremities and edema remains stable   23.  Knee pain bilateral: Will start Voltaren gel.  24. Muscle spasms: replace baclofen with scheduled robaxin  25. Wheezing: increase lasix back to  20mg  BID  26. Decreased sensation in left foot: lumbar spinal XR ordered  LOS: 8 days A FACE TO FACE EVALUATION WAS PERFORMED  Coleby Yett P Manika Hast 03/31/2023, 10:22 AM

## 2023-04-01 ENCOUNTER — Ambulatory Visit: Payer: Managed Care, Other (non HMO) | Admitting: Registered"

## 2023-04-01 DIAGNOSIS — M4714 Other spondylosis with myelopathy, thoracic region: Secondary | ICD-10-CM | POA: Diagnosis not present

## 2023-04-01 LAB — BASIC METABOLIC PANEL
Anion gap: 17 — ABNORMAL HIGH (ref 5–15)
BUN: 29 mg/dL — ABNORMAL HIGH (ref 6–20)
CO2: 19 mmol/L — ABNORMAL LOW (ref 22–32)
Calcium: 9.3 mg/dL (ref 8.9–10.3)
Chloride: 101 mmol/L (ref 98–111)
Creatinine, Ser: 1.61 mg/dL — ABNORMAL HIGH (ref 0.61–1.24)
GFR, Estimated: 49 mL/min — ABNORMAL LOW (ref >60)
Glucose, Bld: 94 mg/dL (ref 70–99)
Potassium: 4.6 mmol/L (ref 3.5–5.1)
Sodium: 137 mmol/L (ref 135–145)

## 2023-04-01 MED ORDER — TIZANIDINE HCL 4 MG PO TABS
2.0000 mg | ORAL_TABLET | ORAL | Status: DC
  Administered 2023-04-02: 2 mg via ORAL
  Filled 2023-04-01: qty 1

## 2023-04-01 MED ORDER — PANTOPRAZOLE SODIUM 40 MG PO TBEC
40.0000 mg | DELAYED_RELEASE_TABLET | Freq: Every day | ORAL | Status: DC | PRN
  Administered 2023-04-02 – 2023-04-03 (×3): 40 mg via ORAL
  Filled 2023-04-01 (×3): qty 1

## 2023-04-01 MED ORDER — BISACODYL 10 MG RE SUPP
10.0000 mg | Freq: Every day | RECTAL | Status: DC
  Administered 2023-04-01: 10 mg via RECTAL
  Filled 2023-04-01: qty 1

## 2023-04-01 NOTE — Progress Notes (Signed)
Physical Therapy Session Note  Patient Details  Name: Raymond [name] MRN: [id number] Date of Birth: 04/29/1966  Today's Date: 04/01/2023 PT Individual Time: 1031-1127 PT Individual Time Calculation (min): 56 min   Short Term Goals: Week 1:  PT Short Term Goal 1 (Week 1): Pt will perform bed mobility with consistent MinA and abiding by spinal precautions. PT Short Term Goal 2 (Week 1): Pt will perform sit<>stand from elevated height with knee guard and ModA +2 PT Short Term Goal 3 (Week 1): Pt will perform slide board transfers in either direction with MinA/ CGA. PT Short Term Goal 4 (Week 1): Pt will propel w/c >25 ft with MinA.  Skilled Therapeutic Interventions/Progress Updates:   Received pt sitting in [facility] - handoff from OT. Pt agreeable to PT treatment and denied any pain during session but reported feeling better from showering. Session with emphasis on DME and discharge planning. [employer] rep present to educate pt on device per MD recommendations - pads placed on hamstrings and encouraged pt to perform knee extension. No noticeable change in spasms, however pt reported liking device.  Discussion had regarding wheelchair options. Pt has 2 different wheelchairs at home but are incorrect size and not in good condition - pt reports not receiving WC through insurance and current chairs have belonged to other people. Discussed pros and cons of lightweight wheelchair, power assist, and power chair and pt's goals - decreasing caregiver burden, community navigation, and maximizing independence while working on strength with goals to progress to functional standing/ambulation in the future.   Attempted to locate 22x18 lightweight manual WC and or power WC but unable to locate either. Reached out to [place] from [employer] for [employer] options and set up [facility] evaluation. Pt's wife Raymond [name] present during session. Discussed pt's CLOF and reviewed home measurement sheet. Pt's bedroom door 30in wide and manual  WC should fit through door but need to confirm with [employer] if power WC would fit through. Bathroom door only 24in wide and no chair will be able to fit through - pt/wife have discussed this with OT. Concluded session with pt sitting in [facility] with wife at bedside. Pt/wife requesting grounds pass and RN attending to needs due to time restrictions.   Therapy Documentation Precautions:  Precautions Precautions: Fall, Back Precaution Comments: spinal Restrictions Weight Bearing Restrictions: No  Therapy/Group: Individual Therapy [name] Blima Rich PT, DPT 04/01/2023, 6:53 AM

## 2023-04-01 NOTE — Patient Care Conference (Signed)
Inpatient RehabilitationTeam Conference and Plan of Care Update Date: 04/01/2023   Time: 11:37 AM   Patient Name: Raymond [name]      Medical Record Number: [id number]  Date of Birth: 06-Jun-1965 Sex: Male         Room/Bed: 4W03C/4W03C-01 Payor Info: Payor: CIG[employer] / Plan: CIGNA MANAGED / Product Type: *No Product type* /    Admit Date/Time:  03/23/2023  7:24 PM  Primary Diagnosis:  Thoracic myelopathy  Hospital Problems: Principal Problem:   Thoracic myelopathy Active Problems:   Coping style affecting medical condition    Expected Discharge Date: Expected Discharge Date: 04/10/23  Team Members Present: Physician leading conference: Dr. Sula Soda Social Worker Present: Lavera Guise, BSW Nurse Present: Vedia Pereyra, RN;Deborah Lambert Mody, RN PT Present: Raechel Chute, PT OT Present: Candee Furbish, OT PPS Coordinator present : Fae Pippin, SLP     Current Status/Progress Goal Weekly Team Focus  Bowel/Bladder   continent of b/b [facility] 10/27  *** remain continent with regular bowel movements   increase fiber and water intake encourage movement and use of laxitive if necessary for bm    Swallow/Nutrition/ Hydration      ***         ADL's   Supervision UB, Total/+2 LB, +2 toileting  *** Min A   Barriers- limited by spasms/pain, lack of carryover/cog, caregiver burden, endurance, standing balance    Mobility   supine<>sit supervision, sit<>stands fluctuate from max of 1 to max of 2 in [place] (but sometimes unable to stand), sideboard transfers min/mod +2, WC mobility 61ft supervision - unable to ambulate  *** Mod I/ sup for bed mobility, sup/ CGA for slide board/ squat pivot, MaxA for stand, sup to MinA for w/c mobility  barriers: BLE weakness, BLE spasms, spinal precautions, body habitus    Communication      ***          Safety/Cognition/ Behavioral Observations     ***          Pain   pain controlled with schedule oxy and spasms are controlled with  muscle relaxers has used PRN muscle relaxer last two nights  *** maintain controlled pain   assess pain qshift and PRN    Skin   sx incision in back skin glue  *** remain infection free with no signs of skin breakdown  assess skin qshift and PRN encouraging q2 turns and time OOB      Discharge Planning:  Discharging home with spouse. Has BSC, RW, WC,Rollator and transfer board   Team Discussion: *** Patient on target to meet rehab goals: {IP REHAB YES/NO WITH [id number]}  *See Care Plan and progress notes for long and short-term goals.   Revisions to Treatment Plan:  ***  Teaching Needs: ***  Current Barriers to Discharge: {BARRIERS TO VFIEPPIRJ:18841}  Possible Resolutions to Barriers: ***     Medical Summary Current Status: spasticity, morbid obesity constipation, AKI on CKD, thoracic myelopathy, lower back pain  Barriers to Discharge: Medical stability;Complicated Wound  Barriers to Discharge Comments: spasticity, morbid obesity constipation, AKI on CKD, thoracic myelopathy, lower back pain Possible Resolutions to [employer] Focus: start tizanidine 2mg  daily, provide dietary education, continue magnesium continue to monitor creatinine, continue to monitor weight, continue pain regimen   Continued Need for Acute Rehabilitation Level of Care: The patient requires daily medical management by a physician with specialized training in physical medicine and rehabilitation for the following reasons: Direction of a multidisciplinary physical rehabilitation program to maximize functional independence :  Yes Medical management of patient stability for increased activity during participation in an intensive rehabilitation regime.: Yes Analysis of laboratory values and/or radiology reports with any subsequent need for medication adjustment and/or medical intervention. : Yes   I attest that I was present, lead the team conference, and concur with the assessment and plan of  the team.   Jearld Adjutant 04/01/2023, 12:07 PM

## 2023-04-01 NOTE — Progress Notes (Signed)
Met with patient and wife in room.  Discussed bowel program at time of admission.  Informed him that would be restarting bowel program with suppository after dinner due to amount of incontinent stools and after a few days of suppository the goal would be to be cleaned out and continent.  He agreed and reports that had the same issue prior to admission and once he was cleaned out he would be continent as well. All need met, therapy in room.

## 2023-04-01 NOTE — Progress Notes (Signed)
Patient ID: Raymond [name], male   DOB: [birth year], 57 y.o.   MRN: [id number]  Team Conference Report to Patient/Family  Team Conference discussion was reviewed with the patient and caregiver, including goals, any changes in plan of care and target discharge date.  Patient and caregiver express understanding and are in agreement.  The patient has a target discharge date of 04/10/23.  SW met with patient and spouse in room to discuss team conference updates. Patient and spouse agreeable to extension. Patient provided disability claim forms. Sw will provide to PA. No additional questions or concerns.   [name] 04/01/2023, 1:39 PM

## 2023-04-01 NOTE — Progress Notes (Signed)
Occupational Therapy Session Note  Patient Details  Name: Raymond [name] MRN: [id number] Date of Birth: 1965-07-23  Today's Date: 04/01/2023 OT Individual Time: 0920-1030 OT Individual Time Calculation (min): 70 min    Short Term Goals: Week 2:  OT Short Term Goal 1 (Week 2): Pt will transfer to [facility] with LRAD and mod A OT Short Term Goal 2 (Week 2): Pt will complete LB self care with AE with mod A OT Short Term Goal 3 (Week 2): Pt will complete sit > stand in prep for ADL with max A OT Short Term Goal 4 (Week 2): Pt will recall 3/3 spinal precautions with supervision A  Skilled Therapeutic Interventions/Progress Updates:  Skilled OT intervention completed with focus on ADL retraining, functional endurance, and mobility within a shower context. Pt received upright in bed, agreeable to session. Intermittent back pain reported; pre-medicated. OT offered rest breaks, repositioning and moist heat via shower for pain relief.  Pt completed all sit > stands in STEDY with mod A +2 and dependent transfers in STEDY during session. Verbal cues needed for anterior weight shifting and body positioning.  Able to void with urinal with mod I. Transitioned to EOB with supervision using bed rail for assist. Doffed underwear and shirt with min A. Sit > stand in STEDY, but had pt remain in stance with seat flaps behind him for safety, to manage/inhibit BLE flexor spasms that often occurs when pt is in perched position. +2 utilized during dependent transfer to rolling shower chair however pt without LOB while in stance with CGA +2.  Transported in roll in shower chair dependently into shower. Pt was able to bathe all parts with min A for buttocks (through opening of the seat) and feet only, at the seated level with ability to lean enough access BLE, but would benefit from long handled sponge to access feet next trial. Cues needed for avoiding twisting per spinal precautions. Transported pt dependently in shower chair  > room, donned shirt/deo with set up A. Threaded knee high TEDs with total A for edema management and shoes for increased stability and toe protection with extensor tone. Sit > stand in STEDY then transferred to EOB for LB dressing. Pt able to 1/2 circle sit and lift LLE to thread underwear and pants, however min A needed for same position with RLE. Stood with STEDY then +2 total A for donning for hips. Re-donned shoes with total A. Utilized STEDY again in stance during transfer to w/c.   MD present; notified of omeprazole request by pt for heart burn that he takes daily at baseline. Advised that current time would not be ideal for TENS unit. Direct care handoff to PT at end of session.   Therapy Documentation Precautions:  Precautions Precautions: Fall, Back Precaution Comments: spinal Restrictions Weight Bearing Restrictions: No    Therapy/Group: Individual Therapy  Melvyn Novas, MS, OTR/L  04/01/2023, 11:38 AM

## 2023-04-01 NOTE — Progress Notes (Addendum)
Physical Therapy Weekly Progress Note  Patient Details  Name: Raymond [name] MRN: [id number] Date of Birth: 1965-07-02  Beginning of progress report period: March 25, 2023 End of progress report period: April 01, 2023  Patient has met 3 of 4 short term goals. Pt demonstrates slow progress towards long term goals. Pt is currently able to perform bed mobility with supervision and use of bed features with intermittent cues to adhere to spinal precautions. Pt is able to stand in [place] with as little as mod A +2 but can require up to max/total A +2 and sometimes is unable to stand due to fatigue. Pt is able to perform slideboard transfers with CGA/min A (with +2 on standby) and propel WC up to 146ft using BUE and supervision. Pt has recently been limited by "spasms"/BLE flexor tone while in standing causing feet to lift from [place] footplates and requires manual facilitation for correction. Pt is also limited by fatigue and global weakness/deconditioning and will require family education prior to discharge. Currently discussing wheelchair options for pt upon discharge.   Patient continues to demonstrate the following deficits muscle weakness and muscle joint tightness, decreased cardiorespiratoy endurance, abnormal tone and unbalanced muscle activation, and decreased standing balance, decreased postural control, decreased balance strategies, and difficulty maintaining precautions and therefore will continue to benefit from skilled PT intervention to increase functional independence with mobility.  Patient progressing toward long term goals..  Continue plan of care.  PT Short Term Goals Week 1:  PT Short Term Goal 1 (Week 1): Pt will perform bed mobility with consistent MinA and abiding by spinal precautions. PT Short Term Goal 1 - Progress (Week 1): Met PT Short Term Goal 2 (Week 1): Pt will perform sit<>stand from elevated height with knee guard and ModA +2 PT Short Term Goal 2 - Progress (Week 1):  Met PT Short Term Goal 3 (Week 1): Pt will perform slide board transfers in either direction with MinA/ CGA. PT Short Term Goal 3 - Progress (Week 1): Met PT Short Term Goal 4 (Week 1): Pt will propel w/c >25 ft with MinA. PT Short Term Goal 4 - Progress (Week 1): Met Week 2:  PT Short Term Goal 1 (Week 2): STG=LTG due to LOS  Skilled Therapeutic Interventions/Progress Updates:  Ambulation/gait training;Community reintegration;DME/adaptive equipment instruction;Neuromuscular re-education;Psychosocial support;Stair training;UE/LE Strength taining/ROM;Wheelchair propulsion/positioning;Balance/vestibular training;Discharge planning;Functional electrical stimulation;Pain management;Skin care/wound management;Therapeutic Activities;UE/LE Coordination activities;Cognitive remediation/compensation;Disease management/prevention;Functional mobility training;Patient/family education;Splinting/orthotics;Therapeutic Exercise;Visual/perceptual remediation/compensation   Therapy Documentation Precautions:  Precautions Precautions: Fall, Back Precaution Comments: spinal Restrictions Weight Bearing Restrictions: No  Therapy/Group: Individual Therapy Raymond [name] Raymond [name] PT, DPT 04/01/2023, 7:06 AM

## 2023-04-01 NOTE — Progress Notes (Signed)
Physical Therapy Session Note  Patient Details  Name: Raymond [name] MRN: [id number] Date of Birth: 1966/01/17  Today's Date: 04/01/2023 PT Individual Time: 1448-1530 PT Individual Time Calculation (min): 42 min   Short Term Goals: Week 2:  PT Short Term Goal 1 (Week 2): STG=LTG due to LOS  Skilled Therapeutic Interventions/Progress Updates:     Pt received supine in bed with CPAP on. Pt removes CPAP and agrees to therapy. Reports pain in back. Number not provided. PT provides rest breaks as needed to manage pain. Pt perform supine to sit with bed features and cues for logrolling and positioning. Pt performs sit to stand from significantly elevated bed with STEDY and maxA, with multiple attempts required and PT providing cues for anterior weight shifting and hip extension. Pt's wife assists to pull pt's shorts up from stedy with partial stand. Pt then transfer to [facility] with totalA. WC transport to gym. Pt participates in Standing frame activity to promote increased WB through BLEs, increased activity tolerance, and core and lower extremity strengthening. Pt able to stand 2x5:00 with cues for posture, engagement of hip extensors and knee extensors, and attempting to perform heel raises. WC transport back to room. Pt performs slideboard transfer to bed with minA and cues  for body mechanics and positioning. ModA management of BLEs for sit to supine. Left with alarm intact and all needs within reach.   Therapy Documentation Precautions:  Precautions Precautions: Fall, Back Precaution Comments: spinal Restrictions Weight Bearing Restrictions: No   Therapy/Group: Individual Therapy  Beau Fanny, PT, DPT 04/01/2023, 4:58 PM

## 2023-04-01 NOTE — Progress Notes (Signed)
PROGRESS NOTE   Subjective/Complaints: Spasms continue to limit therapy, trialed tens unit and he liked it, will schedule tizanidine 2mg  daily Lumbar spine XR shows degenerative changes   ROS: +impaired ambulation, +chronic back pain, +bilateral knee buckling, +stool incontinence, denies lower extremity edema, denies abdominal pain, denies shortness of breath, +decreased sensation on sole of left foot, +spasticity   Objective:   DG Lumbar Spine 2-3 Views  Result Date: 03/31/2023 CLINICAL DATA:  Decreased sensation of foot EXAM: LUMBAR SPINE - 2-3 VIEW COMPARISON:  03/12/2023 FINDINGS: Frontal and lateral views of the lumbar spine are obtained. There are 5 non-rib-bearing lumbar type vertebral bodies in normal alignment. No acute fractures. Mild spondylosis at the thoracolumbar junction. There is mild spondylosis and moderate facet hypertrophy from L3-4 through L5-S1. Sacroiliac joints are normal. IMPRESSION: 1. Degenerative changes at the thoracolumbar and lumbosacral junctions. 2. No acute bony abnormality. Electronically Signed   By: Sharlet Salina M.D.   On: 03/31/2023 17:32   No results for input(s): "WBC", "HGB", "HCT", "PLT" in the last 72 hours.  Recent Labs    03/31/23 0958 04/01/23 0526  NA 137 137  K 4.1 4.6  CL 100 101  CO2 27 19*  GLUCOSE 71 94  BUN 34* 29*  CREATININE 1.80* 1.61*  CALCIUM 9.6 9.3    Intake/Output Summary (Last 24 hours) at 04/01/2023 1132 Last data filed at 04/01/2023 0100 Gross per 24 hour  Intake 712 ml  Output 1400 ml  Net -688 ml        Physical Exam: Vital Signs Blood pressure (!) 153/79, pulse 69, temperature 97.8 F (36.6 C), resp. rate 17, height 5\' 11"  (1.803 m), weight (!) 142.7 kg, SpO2 97%. Gen: no distress, normal appearing HEENT: oral mucosa pink and moist, NCAT Cardio: RRR Pulmonary:     CTAB, normal effort Chest:     Chest wall: Tenderness (left lateral chest)  present.  Abdominal:     General: Bowel sounds are normal. There is no distension.     Palpations: Abdomen is soft.     Tenderness: There is no abdominal tenderness.  Musculoskeletal:        General: Tenderness (back) present. Normal range of motion.     Cervical back: Normal range of motion.     Right lower leg: Edema 1+ present.     Left lower leg: Left lower leg edema: trace.  Skin:    General: Skin is warm and dry.     Comments: Back incision C/D/Intact. Dry scab on left shin. Stasis changes BLE.   Neurological:     Mental Status: He is alert.     Comments: Alert and oriented x 3. Normal insight and awareness. Intact Memory. Normal language and speech. Cranial nerve exam unremarkable. MMT: UE 5/5 bilaterally. RLE 4-/5 HF, KE and 4/5 ADF/PF.  LLE 4/5 HF,KE, 4+ADF/PF.  DTR's 3+ in both LE's. 3-4 beats of clonus each foot. Mild extensor tone in LE's, big toes, knees buckling on standing attempts, decreased sensation in sole of left foot Psychiatric:        Mood and Affect: Mood normal.        Behavior: Behavior normal.  Thought Content: Thought content normal.    Assessment/Plan: 1. Functional deficits which require 3+ hours per day of interdisciplinary therapy in a comprehensive inpatient rehab setting. Physiatrist is providing close team supervision and 24 hour management of active medical problems listed below. Physiatrist and rehab team continue to assess barriers to discharge/monitor patient progress toward functional and medical goals  Care Tool:  Bathing    Body parts bathed by patient: Right arm, Left arm, Chest, Abdomen   Body parts bathed by helper: Front perineal area, Buttocks, Right upper leg, Left upper leg, Right lower leg, Left lower leg     Bathing assist Assist Level: Maximal Assistance - Patient 24 - 49%     Upper Body Dressing/Undressing Upper body dressing   What is the patient wearing?: Pull over shirt    Upper body assist Assist Level:  Supervision/Verbal cueing    Lower Body Dressing/Undressing Lower body dressing      What is the patient wearing?: Pants     Lower body assist Assist for lower body dressing: Total Assistance - Patient < 25%     Toileting Toileting    Toileting assist Assist for toileting: 2 Helpers     Transfers Chair/bed transfer  Transfers assist     Chair/bed transfer assist level: 2 Helpers     Locomotion Ambulation   Ambulation assist   Ambulation activity did not occur: Safety/medical concerns          Walk 10 feet activity   Assist  Walk 10 feet activity did not occur: Safety/medical concerns        Walk 50 feet activity   Assist Walk 50 feet with 2 turns activity did not occur: Safety/medical concerns         Walk 150 feet activity   Assist Walk 150 feet activity did not occur: Safety/medical concerns         Walk 10 feet on uneven surface  activity   Assist Walk 10 feet on uneven surfaces activity did not occur: Safety/medical concerns         Wheelchair     Assist Is the patient using a wheelchair?: Yes Type of Wheelchair: Manual    Wheelchair assist level: Supervision/Verbal cueing Max wheelchair distance: 117ft    Wheelchair 50 feet with 2 turns activity    Assist        Assist Level: Supervision/Verbal cueing   Wheelchair 150 feet activity     Assist      Assist Level: Supervision/Verbal cueing   Blood pressure (!) 153/79, pulse 69, temperature 97.8 F (36.6 C), resp. rate 17, height 5\' 11"  (1.803 m), weight (!) 142.7 kg, SpO2 97%.  Medical Problem List and Plan: 1. 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 Pt s/p T10 laminectomy and discectomy on 03/12/23.              -patient [place] shower             -ELOS/Goals: 17-24 days, goals supervision to min assist at w/c level             -Bilateral PRAFO's  Continue CIR  Grounds pass ordered 2.  Multiple DVTs: Eliquis  started             -antiplatelet therapy: N/A 3. Pain Management: Oxycodone prn--> has been using IV dilaudid couple of times a day. Kpad ordered  Prescribing Home Zynex NexWave Stimulator Device and supplies as needed. IFC, NMES and TENS medically necessary Treatment Rx:  Daily @ 30-40 minutes per treatment PRN. Zynex NexWave only, no substitutions. Treatment Goals: 1) To reduce and/or eliminate pain 2) To improve functional capacity and Activities of daily living 3) To reduce or prevent the need for oral medications 4) To improve circulation in the injured region 5) To decrease or prevent muscle spasm and muscle atrophy 6) To provide a self-management tool to the patient The patient has not sufficiently improved with conservative care. Numerous studies indexed by Medline and PubMed.gov have shown Neuromuscular, Interferential, and TENS stimulators to reduce pain, improve function, and reduce medication use in injured patients. Continued use of this evidence based, safe, drug free treatment is both reasonable and medically necessary at this time.              --He feels that dilaudid works better but oxycodone last longer. Will schedule oxycodone 10 mg TID for now              --Wean down as pain/activity improves.    4. Mood/Behavior/Sleep: LCSW to follow for evaluation and support.              -antipsychotic agents: N/A  -Patient seen by neuropsychology  5. Neuropsych/cognition: This patient is capable of making decisions on his own behalf. 6. Skin/Wound Care: back incision is CDI. [place] be left open to air  7. Fluids/Electrolytes/Nutrition: Monitor I/O. Check CMET in am  8. Spasticity: slightly improved with addition of low dose baclofen last week. Increase baclofen to 5mg  TID for now 9. HTN: Monitor BS TID--continue Amlodipine,  Coreg, Cardura and Lasix bid             --labile with SBP ranging 130-160 during the day. Monitor for orthostatic symptoms.   -10/28 SBP 140-146, amlodipine was  stopped by nephrology, continue current regimen    04/01/2023    3:45 AM 03/31/2023    8:40 PM 03/31/2023    2:57 PM  Vitals with BMI  Systolic 153 113 161  Diastolic 79 50 84  Pulse 69 64 61    10.  OSA: CPAP at nights-->Is compliant with his machine and advised him to ask his wife to bring it from home.  -Patient reports he got his CPAP machine from home 11. T2DM: Hgb A1C- 6.0 and well controlled.  D/c CBGs and insulin.  BS stable on glucotrol and metformin daily in am.  --monitor for changes with increase in activity.  -CBGs controlled CBG (last 3)  No results for input(s): "GLUCAP" in the last 72 hours.   10. COPD: Has intermittent cough " from 40 yrs of smoking"             --resume MDI. 11. Hypokalemia: Likely due to diuretic/intake. Will continue K dur  30 meq BID --K- 3.3 on 10/09 (admission)-->recheck BMET in am 12.  AKI on CKD: Baseline SCr around 1.3--creatinine reviewed and improved, discussed with patient, consulted nephrology, lasix decreased to 20mg  BID, encouraged 6-8 glasses of water daily and repeat BMP tomorrow. Advised not to use Goodys or Mobic. Amlodipine d/ced Lasix decreased to 10mg  BID given AKI and lower weights, repeat BMP today  -Has been seen by nephrology, BUNs/CR stable at 46/2.1, continue current regimen for now  Repeat BMP today  13. Leucocytosis: Monitor for fevers and other signs of infection. WBC was up to 13.3 after surgery.             --recheck WBC in am 14. Acute on chronic anemia: Recheck H/H in am 15. Constipation/neurogenic bowel.   --  On Senna 1 bid--> change to 2 in am. Sorbitol 45 cc tonight. -dulcolax supp in pm Bowel program restarted  16.  Neurogenic bladder: Continue Flomax and Cardura. Foley was placed at admission             -continue foley tonight.  -Voiding trial in AM -10/28 recent continent PVRs not elevated  17. Morbid obesity: BMI -44. Was 317 at admission and down to 315 lbs. He would like to lose more weight.   --Will need ongoing education on diet as well as importance of weight loss to promote mobility and wound healing  18.  Anxiety d/o: Continue Cymbalta with prn Xanax. Magnesium added given suboptimal levels  19.  Left chest wall/rib pain: Has been ongoing since 4 days and feels that is positional. Lidocaine patches at night as pain worse then.  CXR reviewed and shows no fracture/infection  20. Hypoalbuminemia: encouraged prioritizing protein in diet  21. ESBL: completed nitrofurantoin  22. Polypharmacy: decrease lasix to 10mg  BID given decreased weight and AKI on CKD, examined lower extremities and edema remains stable   23.  Knee pain bilateral: Will start Voltaren gel.  24. Muscle spasms: replace baclofen with scheduled robaxin, schedule tizanidine 2mg  daily  25. Wheezing: increase lasix back to 20mg  BID, discussed that this has resolved  26. Decreased sensation in left foot: lumbar spinal XR ordered, reviewed and shows degenerative changes  LOS: 9 days A FACE TO FACE EVALUATION WAS PERFORMED  Raymond [name] P Kahron Kauth 04/01/2023, 11:32 AM

## 2023-04-01 NOTE — Progress Notes (Signed)
Physical Therapy Session Note  Patient Details  Name: Raymond [name] MRN: [id number] Date of Birth: January 16, 1966  Today's Date: 04/01/2023 PT Individual Time: 1305-1335 PT Individual Time Calculation (min): 30 min   Short Term Goals: Week 2:  PT Short Term Goal 1 (Week 2): STG=LTG due to LOS  Skilled Therapeutic Interventions/Progress Updates: Pt presented in w/c agreeable to therapy. Pt indicates some fatigue but denies pain at current time. Pt transported to day room for time management. Participated in Sit to stand with Stedy for BLE strengthening and increased weight bearing through BLE for spasticity control. Pt set up in [place] and unable to complete stand on first attempt as unable to power up. Instructed pt in using momentum and agreeable to count to 3. Second attempt pt was able to lift hips but unable to complete stand. Thirst attempt pt able to complete stand with maxA x 1 and +2 present for safety. Pt was able to tolerate standing for ~30sec with PTA providing cues for improved hip extension. Pt the c/o increased R knee pain to which pillow was provided for cushioning. Pt was able to stand successfully a second time but only able to tolerate ~12-15 seconds before requiring seated rest. Pt then propelled 83ft back to room and then transported remaining distance. Pt left in w/c at end of session with wife present, call bell within reach and needs met.    Therapy Documentation Precautions:  Precautions Precautions: Fall, Back Precaution Comments: spinal Restrictions Weight Bearing Restrictions: No  Therapy/Group: Individual Therapy  Josejulian Tarango 04/01/2023, 4:24 PM

## 2023-04-02 ENCOUNTER — Inpatient Hospital Stay (HOSPITAL_COMMUNITY): Payer: Managed Care, Other (non HMO)

## 2023-04-02 DIAGNOSIS — M4714 Other spondylosis with myelopathy, thoracic region: Secondary | ICD-10-CM | POA: Diagnosis not present

## 2023-04-02 LAB — BASIC METABOLIC PANEL
Anion gap: 11 (ref 5–15)
BUN: 29 mg/dL — ABNORMAL HIGH (ref 6–20)
CO2: 27 mmol/L (ref 22–32)
Calcium: 9.4 mg/dL (ref 8.9–10.3)
Chloride: 97 mmol/L — ABNORMAL LOW (ref 98–111)
Creatinine, Ser: 1.73 mg/dL — ABNORMAL HIGH (ref 0.61–1.24)
GFR, Estimated: 45 mL/min — ABNORMAL LOW (ref >60)
Glucose, Bld: 95 mg/dL (ref 70–99)
Potassium: 3.4 mmol/L — ABNORMAL LOW (ref 3.5–5.1)
Sodium: 135 mmol/L (ref 135–145)

## 2023-04-02 LAB — CBC WITH DIFFERENTIAL/PLATELET
Abs Immature Granulocytes: 0.13 10*3/uL — ABNORMAL HIGH (ref 0.00–0.07)
Basophils Absolute: 0.1 10*3/uL (ref 0.0–0.1)
Basophils Relative: 1 %
Eosinophils Absolute: 0.7 10*3/uL — ABNORMAL HIGH (ref 0.0–0.5)
Eosinophils Relative: 6 %
HCT: 34.7 % — ABNORMAL LOW (ref 39.0–52.0)
Hemoglobin: 11.3 g/dL — ABNORMAL LOW (ref 13.0–17.0)
Immature Granulocytes: 1 %
Lymphocytes Relative: 20 %
Lymphs Abs: 2.5 10*3/uL (ref 0.7–4.0)
MCH: 29.3 pg (ref 26.0–34.0)
MCHC: 32.6 g/dL (ref 30.0–36.0)
MCV: 89.9 fL (ref 80.0–100.0)
Monocytes Absolute: 0.9 10*3/uL (ref 0.1–1.0)
Monocytes Relative: 7 %
Neutro Abs: 8.4 10*3/uL — ABNORMAL HIGH (ref 1.7–7.7)
Neutrophils Relative %: 65 %
Platelets: 316 10*3/uL (ref 150–400)
RBC: 3.86 MIL/uL — ABNORMAL LOW (ref 4.22–5.81)
RDW: 15.7 % — ABNORMAL HIGH (ref 11.5–15.5)
WBC: 12.7 10*3/uL — ABNORMAL HIGH (ref 4.0–10.5)
nRBC: 0 % (ref 0.0–0.2)

## 2023-04-02 LAB — URINALYSIS, W/ REFLEX TO CULTURE (INFECTION SUSPECTED)
Bacteria, UA: NONE SEEN
Bilirubin Urine: NEGATIVE
Glucose, UA: NEGATIVE mg/dL
Hgb urine dipstick: NEGATIVE
Ketones, ur: NEGATIVE mg/dL
Leukocytes,Ua: NEGATIVE
Nitrite: NEGATIVE
Protein, ur: 100 mg/dL — AB
Specific Gravity, Urine: 1.011 (ref 1.005–1.030)
pH: 5 (ref 5.0–8.0)

## 2023-04-02 LAB — GLUCOSE, CAPILLARY: Glucose-Capillary: 75 mg/dL (ref 70–99)

## 2023-04-02 MED ORDER — FUROSEMIDE 20 MG PO TABS
10.0000 mg | ORAL_TABLET | Freq: Two times a day (BID) | ORAL | Status: DC
  Administered 2023-04-02 – 2023-04-03 (×2): 10 mg via ORAL
  Filled 2023-04-02 (×2): qty 1

## 2023-04-02 MED ORDER — POTASSIUM CHLORIDE 20 MEQ PO PACK
40.0000 meq | PACK | Freq: Once | ORAL | Status: AC
  Administered 2023-04-02: 40 meq via ORAL
  Filled 2023-04-02: qty 2

## 2023-04-02 MED ORDER — TIZANIDINE HCL 4 MG PO TABS
2.0000 mg | ORAL_TABLET | Freq: Every day | ORAL | Status: DC
  Administered 2023-04-03 – 2023-04-10 (×8): 2 mg via ORAL
  Filled 2023-04-02 (×8): qty 1

## 2023-04-02 NOTE — Plan of Care (Signed)
  Problem: Consults Goal: RH SPINAL CORD INJURY PATIENT EDUCATION Description:  See Patient Education module for education specifics.  Outcome: Progressing   Problem: SCI BOWEL ELIMINATION Goal: RH STG MANAGE BOWEL WITH ASSISTANCE Description: STG Manage Bowel with min/mod Assistance. Outcome: Progressing Goal: RH STG SCI MANAGE BOWEL WITH MEDICATION WITH ASSISTANCE Description: STG SCI Manage bowel with medication with min assistance. Outcome: Progressing Goal: RH STG SCI MANAGE BOWEL PROGRAM W/ASSIST OR AS APPROPRIATE Description: STG SCI Manage bowel program w/min/mod assist or as appropriate. Outcome: Progressing   Problem: SCI BLADDER ELIMINATION Goal: RH STG MANAGE BLADDER WITH ASSISTANCE Description: STG Manage Bladder With min Assistance Outcome: Progressing Goal: RH STG SCI MANAGE BLADDER PROGRAM W/ASSISTANCE Description: Patient will be able to manage bladder with min assist  Outcome: Progressing   Problem: RH SKIN INTEGRITY Goal: RH STG SKIN FREE OF INFECTION/BREAKDOWN Description: Skin will remain free of infection/breakdown with min assist  Outcome: Progressing Goal: RH STG MAINTAIN SKIN INTEGRITY WITH ASSISTANCE Description: STG Maintain Skin Integrity With min Assistance. Outcome: Progressing Goal: RH STG ABLE TO PERFORM INCISION/WOUND CARE W/ASSISTANCE Description: STG Able To Perform Incision/Wound Care With min Assistance. Outcome: Progressing   Problem: RH SAFETY Goal: RH STG ADHERE TO SAFETY PRECAUTIONS W/ASSISTANCE/DEVICE Description: STG Adhere to Safety Precautions With cueing Assistance/Device. Outcome: Progressing Goal: RH STG DECREASED RISK OF FALL WITH ASSISTANCE Description: STG Decreased Risk of Fall With min Assistance. Outcome: Progressing   Problem: RH PAIN MANAGEMENT Goal: RH STG PAIN MANAGED AT OR BELOW PT'S PAIN GOAL Description: Pain will be less than 4 with PRN medications min assist  Outcome: Progressing   Problem: RH KNOWLEDGE  DEFICIT SCI Goal: RH STG INCREASE KNOWLEDGE OF SELF CARE AFTER SCI Description: Patient/caregiver will be able to manage medications, skin care, and bowel/bladder from nursing education, nursing handouts, and other resources independently  Outcome: Progressing

## 2023-04-02 NOTE — Progress Notes (Signed)
Physical Therapy Session Note  Patient Details  Name: Raymond [name] MRN: [id number] Date of Birth: [birth year]  Today's Date: 04/02/2023 PT Individual Time: 1415-1526 PT Individual Time Calculation (min): 71 min   Short Term Goals: Week 1:  PT Short Term Goal 1 (Week 1): Pt will perform bed mobility with consistent MinA and abiding by spinal precautions. PT Short Term Goal 1 - Progress (Week 1): Progressing toward goal PT Short Term Goal 2 (Week 1): Pt will perform sit<>stand from elevated height with knee guard and ModA +2 PT Short Term Goal 2 - Progress (Week 1): Met PT Short Term Goal 3 (Week 1): Pt will perform slide board transfers in either direction with MinA/ CGA. PT Short Term Goal 3 - Progress (Week 1): Met PT Short Term Goal 4 (Week 1): Pt will propel w/c >25 ft with MinA. PT Short Term Goal 4 - Progress (Week 1): Met  Skilled Therapeutic Interventions/Progress Updates:      Therapy Documentation Precautions:  Precautions Precautions: Fall, Back Precaution Comments: spinal Restrictions Weight Bearing Restrictions: No  Pt agreeable to PT session with emphasis on transfer training (slide board and sit<>stand). Pt with unrated bilateral knee pain with mobility, pre-medicated and provided rest/repositioning for relief. Pt total A for slide board placement. Pt progressed from mod A to min A with slide board transfers throughout session. Pt reports lethargy and increase fatigue with onset in past few days, MD, PA and priamry PT notified. Pt particpated in blocked practice of sit<>stand transfers with max A x 2. Pt utilized 6 inch blocks to push up from and required bilateral knee block. Educated on momemntum, head/hips and forward flexion with transfer and pt performed 2 sit<>stand and able to attain full upright posture. Pt returned to room and left seated at bedside with all needs in reach and alarm on.      Therapy/Group: Individual Therapy  Raymond [name] Raymond [name]  PT, DPT  04/02/2023, 3:20 PM

## 2023-04-02 NOTE — Progress Notes (Signed)
Patient ID: Raymond [name], male   DOB: 06-14-1965, 57 y.o.   MRN: [id number]  Allegra Grana and hospital bed ordered through [facility].

## 2023-04-02 NOTE — Progress Notes (Signed)
Received message via secure chat regarding patient's lethargy for past few days. CBC ordered.  Earlier PT note reviewed and see documentation of nausea. Had large BM yesterday and KUB without signs of obstipation or ileus. Reached out to nurse who reports issues with dyspepsia but he was able to eat lunch today. He has completed  6 day course of macrodantin for ESBL E coli UTI  Will repeat UA to rule out infection as cause.

## 2023-04-02 NOTE — Progress Notes (Addendum)
Patient ID: Raymond [name], male   DOB: 1966/01/08, 57 y.o.   MRN: [id number]  [facility] referral sent to [facility]. Patient approved. Orders sent.

## 2023-04-02 NOTE — Progress Notes (Signed)
Physical Therapy Session Note  Patient Details  Name: Raymond [name] MRN: [id number] Date of Birth: March 20, 1966  Today's Date: 04/02/2023 PT Individual Time: 0730-0827 PT Individual Time Calculation (min): 57 min   Short Term Goals: Week 1:  PT Short Term Goal 1 (Week 1): Pt will perform bed mobility with consistent MinA and abiding by spinal precautions. PT Short Term Goal 1 - Progress (Week 1): Progressing toward goal PT Short Term Goal 2 (Week 1): Pt will perform sit<>stand from elevated height with knee guard and ModA +2 PT Short Term Goal 2 - Progress (Week 1): Met PT Short Term Goal 3 (Week 1): Pt will perform slide board transfers in either direction with MinA/ CGA. PT Short Term Goal 3 - Progress (Week 1): Met PT Short Term Goal 4 (Week 1): Pt will propel w/c >25 ft with MinA. PT Short Term Goal 4 - Progress (Week 1): Met Week 2:  PT Short Term Goal 1 (Week 2): STG=LTG due to LOS  Skilled Therapeutic Interventions/Progress Updates:   Received pt supine in bed asleep with CPAP on. Pt easily awoken and agreeable to PT treatment but reported bad indigestion for past 2 days and inability to keep food down - MD made aware. Session with emphasis on functional mobility/transfers, toileting, generalized strengthening and endurance, and standing tolerance. Donned knee high teds with total A for edema management and pt transferred semi-reclined<>sitting L EOB with HOB slightly elevated and use of bedrails with supervision and cues for logroll technique. Discussed bed mobility and pt reports having flat bed at home and does not like his hospital bed - most likely will need bed assist rails.  Pt reported not having BM despite starting bowel program and receiving suppository yesterday. Pt requesting to void and sit on bedside commode to try to have BM. Donned shoes with max A, stood in [place] from elevated EOB with max A (+2 on standby), and transferred to bedside commode in [place] dependently (pt's legs  went into flexor tone while transferring to commode and required only minimal assist today to place back on footplates). Transferred semi-stand<>stand<>sitting on commode with max A and required assist to remove underwear. Pt able to void in urinal and with small loose BM - RN made aware. Donned shorts with total A and required multiple attempts and stood from bedside commode in [place] with max A (+2 providing min A) and dependent for pericare and to pull pants over hips. Transferred to WC in [place] dependently and required mod A +2 (due to fatigue) to transition from semi-stand<>stand<>sitting in WC. Pt sat in WC at sink and brushed teeth/washed face with set up assist. Pt instructed in seated knee extension 2x10 and seated hip flexion 2x10 for active cool down and concluded session with pt sitting in WC, needs within reach, and seatbelt alarm on. X-ray arrived at end of session.   Therapy Documentation Precautions:  Precautions Precautions: Fall, Back Precaution Comments: spinal Restrictions Weight Bearing Restrictions: No  Therapy/Group: Individual Therapy [name] Blima Rich PT, DPT 04/02/2023, 7:02 AM

## 2023-04-02 NOTE — Progress Notes (Signed)
Occupational Therapy Session Note  Patient Details  Name: Raymond [name] MRN: [id number] Date of Birth: 04/05/1966  Today's Date: 04/02/2023 OT Individual Time: 3875-6433 OT Individual Time Calculation (min): 70 min    Short Term Goals: Week 2:  OT Short Term Goal 1 (Week 2): Pt will transfer to [facility] with LRAD and mod A OT Short Term Goal 2 (Week 2): Pt will complete LB self care with AE with mod A OT Short Term Goal 3 (Week 2): Pt will complete sit > stand in prep for ADL with max A OT Short Term Goal 4 (Week 2): Pt will recall 3/3 spinal precautions with supervision A  Skilled Therapeutic Interventions/Progress Updates:    Pt resting in bed upon arrival. OT intervention with focus on bed mobility, SB transfers, SB transfers, SB placement, BUE strengthening, w/c mobility, and safety awareness to increase independence with BADLs. Supine>sit EOB with supervision using bed rails with HOB elevated. SB transfer with CGA. Pt propelled w/c to nursing station with supervision. Transfer to EOM with CGA. BUE strengthening and sitting balance. Pt tossed 1 kg ball against bounce bag 3x10. Pt used 3# bar to tap large therapy ball 3x10. Pt practiced SB placement on Rt and Lt side. Min A for SB placement. Pt returned to w/c and propelled to elevators. Pt returned to room and remained in w/c with all needs within reach. Wife present. Belt alarm acticated   Therapy Documentation Precautions:  Precautions Precautions: Fall, Back Precaution Comments: spinal Restrictions Weight Bearing Restrictions: No   Pain: Pt reports back pain is better since meds earlier in morning; 4/10 back pain but "tolerable); repositioned Therapy/Group: Individual Therapy  Rich Brave 04/02/2023, 11:57 AM

## 2023-04-02 NOTE — Progress Notes (Signed)
PROGRESS NOTE   Subjective/Complaints: No new complaints this morning Continues to have GERD, discussed that we do not carry Prilosec here but have te equivalent of Pantoprazole   ROS: +impaired ambulation, +chronic back pain, +bilateral knee buckling, +stool incontinence, denies lower extremity edema, denies abdominal pain, denies shortness of breath, +decreased sensation on sole of left foot, +spasticity, +GERD   Objective:   DG Lumbar Spine 2-3 Views  Result Date: 03/31/2023 CLINICAL DATA:  Decreased sensation of foot EXAM: LUMBAR SPINE - 2-3 VIEW COMPARISON:  03/12/2023 FINDINGS: Frontal and lateral views of the lumbar spine are obtained. There are 5 non-rib-bearing lumbar type vertebral bodies in normal alignment. No acute fractures. Mild spondylosis at the thoracolumbar junction. There is mild spondylosis and moderate facet hypertrophy from L3-4 through L5-S1. Sacroiliac joints are normal. IMPRESSION: 1. Degenerative changes at the thoracolumbar and lumbosacral junctions. 2. No acute bony abnormality. Electronically Signed   By: Sharlet Salina M.D.   On: 03/31/2023 17:32   No results for input(s): "WBC", "HGB", "HCT", "PLT" in the last 72 hours.  Recent Labs    04/01/23 0526 04/02/23 0527  NA 137 135  K 4.6 3.4*  CL 101 97*  CO2 19* 27  GLUCOSE 94 95  BUN 29* 29*  CREATININE 1.61* 1.73*  CALCIUM 9.3 9.4    Intake/Output Summary (Last 24 hours) at 04/02/2023 1006 Last data filed at 04/02/2023 1002 Gross per 24 hour  Intake 480 ml  Output 700 ml  Net -220 ml        Physical Exam: Vital Signs Blood pressure (!) 157/86, pulse 64, temperature 97.6 F (36.4 C), resp. rate 17, height 5\' 11"  (1.803 m), weight (!) 146.6 kg, SpO2 97%. Gen: no distress, normal appearing HEENT: oral mucosa pink and moist, NCAT Cardio: RRR Pulmonary:     CTAB, normal effort Chest:     Chest wall: Tenderness (left lateral chest)  present.  Abdominal:     General: Bowel sounds are normal. There is no distension.     Palpations: Abdomen is soft.     Tenderness: There is no abdominal tenderness.  Musculoskeletal:        General: Tenderness (back) present. Normal range of motion.     Cervical back: Normal range of motion.     Right lower leg: Edema 1+ present.     Left lower leg: Left lower leg edema: trace.  Skin:    General: Skin is warm and dry.     Comments: Back incision C/D/Intact. Dry scab on left shin. Stasis changes BLE.   Neurological:     Mental Status: He is alert.     Comments: Alert and oriented x 3. Normal insight and awareness. Intact Memory. Normal language and speech. Cranial nerve exam unremarkable. MMT: UE 5/5 bilaterally. RLE 4-/5 HF, KE and 4/5 ADF/PF.  LLE 4/5 HF,KE, 4+ADF/PF.  DTR's 3+ in both LE's. 3-4 beats of clonus each foot. Mild extensor tone in LE's, big toes, knees buckling on standing attempts, decreased sensation in sole of left foot, exam stable 10/31 Psychiatric:        Mood and Affect: Mood normal.        Behavior: Behavior  normal.        Thought Content: Thought content normal.    Assessment/Plan: 1. Functional deficits which require 3+ hours per day of interdisciplinary therapy in a comprehensive inpatient rehab setting. Physiatrist is providing close team supervision and 24 hour management of active medical problems listed below. Physiatrist and rehab team continue to assess barriers to discharge/monitor patient progress toward functional and medical goals  Care Tool:  Bathing    Body parts bathed by patient: Right arm, Left arm, Chest, Abdomen, Front perineal area, Right upper leg, Left upper leg, Right lower leg, Left lower leg, Face   Body parts bathed by helper: Buttocks     Bathing assist Assist Level: Minimal Assistance - Patient > 75%     Upper Body Dressing/Undressing Upper body dressing   What is the patient wearing?: Pull over shirt    Upper body assist  Assist Level: Set up assist    Lower Body Dressing/Undressing Lower body dressing      What is the patient wearing?: Underwear/pull up, Pants     Lower body assist Assist for lower body dressing: Maximal Assistance - Patient 25 - 49%     Toileting Toileting    Toileting assist Assist for toileting: 2 Helpers     Transfers Chair/bed transfer  Transfers assist     Chair/bed transfer assist level: 2 Helpers     Locomotion Ambulation   Ambulation assist   Ambulation activity did not occur: Safety/medical concerns          Walk 10 feet activity   Assist  Walk 10 feet activity did not occur: Safety/medical concerns        Walk 50 feet activity   Assist Walk 50 feet with 2 turns activity did not occur: Safety/medical concerns         Walk 150 feet activity   Assist Walk 150 feet activity did not occur: Safety/medical concerns         Walk 10 feet on uneven surface  activity   Assist Walk 10 feet on uneven surfaces activity did not occur: Safety/medical concerns         Wheelchair     Assist Is the patient using a wheelchair?: Yes Type of Wheelchair: Manual    Wheelchair assist level: Supervision/Verbal cueing Max wheelchair distance: 124ft    Wheelchair 50 feet with 2 turns activity    Assist        Assist Level: Supervision/Verbal cueing   Wheelchair 150 feet activity     Assist      Assist Level: Supervision/Verbal cueing   Blood pressure (!) 157/86, pulse 64, temperature 97.6 F (36.4 C), resp. rate 17, height 5\' 11"  (1.803 m), weight (!) 146.6 kg, SpO2 97%.  Medical Problem List and Plan: 1. 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 Pt s/p T10 laminectomy and discectomy on 03/12/23.              -patient [place] shower             -ELOS/Goals: 17-24 days, goals supervision to min assist at w/c level             -Bilateral PRAFO's  Continue CIR  Grounds pass  ordered  Discussed that he would like to purchase Stedy 2.  Multiple DVTs: Eliquis started             -antiplatelet therapy: N/A 3. Pain Management: Oxycodone prn--> has been using IV dilaudid couple of times a day.  Kpad ordered  Prescribed home Zynex machine.              --He feels that dilaudid works better but oxycodone last longer. Will schedule oxycodone 10 mg TID for now              --Wean down as pain/activity improves.    4. Mood/Behavior/Sleep: LCSW to follow for evaluation and support.              -antipsychotic agents: N/A  -Patient seen by neuropsychology  5. Neuropsych/cognition: This patient is capable of making decisions on his own behalf. 6. Skin/Wound Care: back incision is CDI. Gemme be left open to air  7. Fluids/Electrolytes/Nutrition: Monitor I/O. Check CMET in am  Insomnia: continue melatonin HS  9. HTN: Monitor BS TID--continue Amlodipine,  Coreg, Cardura and Lasix bid             --labile with SBP ranging 130-160 during the day. Monitor for orthostatic symptoms.   -10/28 SBP 140-146, amlodipine was stopped by nephrology, continue current regimen    04/02/2023    6:43 AM 04/02/2023    5:00 AM 04/01/2023    8:08 PM  Vitals with BMI  Weight  323 lbs 3 oz   BMI  45.1   Systolic 157  131  Diastolic 86  63  Pulse 64  63    10.  OSA: CPAP at nights-->Is compliant with his machine and advised him to ask his wife to bring it from home.  -Patient reports he got his CPAP machine from home 11. T2DM: Hgb A1C- 6.0 and well controlled.  Add CBG checks back as appetite has decreased due to GERD.  BS stable on glucotrol and metformin daily in am.  --monitor for changes with increase in activity.  -CBGs controlled CBG (last 3)  No results for input(s): "GLUCAP" in the last 72 hours.   10. COPD: Has intermittent cough " from 40 yrs of smoking"             --resume MDI. 11. Hypokalemia: Likely due to diuretic/intake. Will continue K dur  30 meq BID --K- 3.3 on  10/09 (admission)-->recheck BMET in am 12.  AKI on CKD: Baseline SCr around 1.3--creatinine reviewed and improved, discussed with patient, consulted nephrology, lasix decreased to 20mg  BID, encouraged 6-8 glasses of water daily and repeat BMP tomorrow. Advised not to use Goodys or Mobic. Amlodipine d/ced Lasix decreased to 10mg  BID given AKI and lower weights, repeat BMP today  -Has been seen by nephrology, BUNs/CR stable at 46/2.1, continue current regimen for now  Repeat BMP today  13. Leucocytosis: Monitor for fevers and other signs of infection. WBC was up to 13.3 after surgery.             --recheck WBC in am 14. Acute on chronic anemia: Recheck H/H in am 15. Constipation/neurogenic bowel.   -- On Senna 1 bid--> change to 2 in am. Sorbitol 45 cc tonight. -dulcolax supp in pm Bowel program restarted  16.  Neurogenic bladder: Continue Flomax and Cardura. Foley was placed at admission             -continue foley tonight.  -Voiding trial in AM -10/28 recent continent PVRs not elevated  17. Morbid obesity: BMI -44. Was 317 at admission and down to 315 lbs. He would like to lose more weight.  --Will need ongoing education on diet as well as importance of weight loss to promote mobility and wound healing  18.  Anxiety d/o: Continue Cymbalta with prn Xanax. Magnesium added given suboptimal levels  19.  Left chest wall/rib pain: Has been ongoing since 4 days and feels that is positional. Lidocaine patches at night as pain worse then.  CXR reviewed and shows no fracture/infection  20. Hypoalbuminemia: encouraged prioritizing protein in diet  21. ESBL: completed nitrofurantoin  22. Polypharmacy: decrease lasix to 10mg  BID given decreased weight and AKI on CKD, examined lower extremities and edema remains stable   23.  Knee pain bilateral: Will start Voltaren gel.  24. Muscle spasms: replace baclofen with scheduled robaxin, schedule tizanidine 2mg  daily, increase to BID  25. Wheezing:  decrease lasix back to 10mg  BID given worsening creatinine today, discussed that this has resolved  26. Decreased sensation in left foot: lumbar spinal XR ordered, reviewed and shows degenerative changes  27. GERD: Pantoprazole ordered.   LOS: 10 days A FACE TO FACE EVALUATION WAS PERFORMED  Sharry Beining P Madeline Pho 04/02/2023, 10:06 AM

## 2023-04-03 DIAGNOSIS — M4714 Other spondylosis with myelopathy, thoracic region: Secondary | ICD-10-CM | POA: Diagnosis not present

## 2023-04-03 LAB — BASIC METABOLIC PANEL
Anion gap: 14 (ref 5–15)
BUN: 24 mg/dL — ABNORMAL HIGH (ref 6–20)
CO2: 22 mmol/L (ref 22–32)
Calcium: 9.6 mg/dL (ref 8.9–10.3)
Chloride: 98 mmol/L (ref 98–111)
Creatinine, Ser: 1.67 mg/dL — ABNORMAL HIGH (ref 0.61–1.24)
GFR, Estimated: 47 mL/min — ABNORMAL LOW (ref >60)
Glucose, Bld: 97 mg/dL (ref 70–99)
Potassium: 3.9 mmol/L (ref 3.5–5.1)
Sodium: 134 mmol/L — ABNORMAL LOW (ref 135–145)

## 2023-04-03 LAB — GLUCOSE, CAPILLARY
Glucose-Capillary: 94 mg/dL (ref 70–99)
Glucose-Capillary: 81 mg/dL (ref 70–99)
Glucose-Capillary: 68 mg/dL — ABNORMAL LOW (ref 70–99)
Glucose-Capillary: 170 mg/dL — ABNORMAL HIGH (ref 70–99)
Glucose-Capillary: 96 mg/dL (ref 70–99)

## 2023-04-03 MED ORDER — ALUM & MAG HYDROXIDE-SIMETH 200-200-20 MG/5ML PO SUSP: 30.0000 mL | ORAL | Status: DC | PRN

## 2023-04-03 MED ORDER — SENNA 8.6 MG PO TABS
2.0000 | ORAL_TABLET | Freq: Every day | ORAL | Status: DC
  Administered 2023-04-03 – 2023-04-09 (×7): 17.2 mg via ORAL
  Filled 2023-04-03 (×7): qty 2

## 2023-04-03 MED ORDER — DOCUSATE SODIUM 100 MG PO CAPS
100.0000 mg | ORAL_CAPSULE | Freq: Three times a day (TID) | ORAL | Status: DC
  Administered 2023-04-03 – 2023-04-04 (×4): 100 mg via ORAL
  Filled 2023-04-03 (×5): qty 1

## 2023-04-03 MED ORDER — FUROSEMIDE 20 MG PO TABS
20.0000 mg | ORAL_TABLET | Freq: Two times a day (BID) | ORAL | Status: DC
  Administered 2023-04-03 – 2023-04-06 (×6): 20 mg via ORAL
  Filled 2023-04-03 (×7): qty 1

## 2023-04-03 MED ORDER — DULOXETINE HCL 20 MG PO CPEP
40.0000 mg | ORAL_CAPSULE | Freq: Every day | ORAL | Status: DC
  Administered 2023-04-04 – 2023-04-08 (×5): 40 mg via ORAL
  Filled 2023-04-03 (×5): qty 2

## 2023-04-03 MED ORDER — GLIPIZIDE 5 MG PO TABS
5.0000 mg | ORAL_TABLET | Freq: Every day | ORAL | Status: DC
  Administered 2023-04-04: 5 mg via ORAL
  Filled 2023-04-03: qty 1

## 2023-04-03 NOTE — Progress Notes (Signed)
Physical Therapy Session Note  Patient Details  Name: Raymond [name] MRN: [id number] Date of Birth: Jul 10, 1965  Today's Date: 04/03/2023 PT Individual Time: 1000-1058 PT Individual Time Calculation (min): 58 min   Short Term Goals: Week 1:  PT Short Term Goal 1 (Week 1): Pt will perform bed mobility with consistent MinA and abiding by spinal precautions. PT Short Term Goal 1 - Progress (Week 1): Progressing toward goal PT Short Term Goal 2 (Week 1): Pt will perform sit<>stand from elevated height with knee guard and ModA +2 PT Short Term Goal 2 - Progress (Week 1): Met PT Short Term Goal 3 (Week 1): Pt will perform slide board transfers in either direction with MinA/ CGA. PT Short Term Goal 3 - Progress (Week 1): Met PT Short Term Goal 4 (Week 1): Pt will propel w/c >25 ft with MinA. PT Short Term Goal 4 - Progress (Week 1): Met Week 2:  PT Short Term Goal 1 (Week 2): STG=LTG due to LOS  Skilled Therapeutic Interventions/Progress Updates:   Received pt sitting in WC, pt agreeable to PT treatment, and denied any pain during session. Session with emphasis on functional mobility/transfers, generalized strengthening and endurance, and standing tolerance. Pt transported to/from room in [facility] dependently for time management purposes. Pt performed slideboard transfer to/from mat with min A (+2 providing CGA) to get to mat and CGA (+2 to stabilize equipment) returning to mat with assist to place/remove board.   Attempted to stand with max/total A +2 with bilateral knee block for 3rd person to don LiteGait harness, however pt unable to come upright (reporting weakness, feeling "shaky", and fearful) therefore retrieved Stedy and pt stood in [place] with max A +2 while 3rd person donned LiteGait harness. Rested on Stedy flaps then transferred semi-stand<>stand<>sitting EOM with max A +2 and intermittent assist to keep feet on Stedy footplates. Pt stood in LiteGait x 2 trials and progressed to ambulating 54ft  forwards and 68ft backwards in LiteGait with +2/+3 assist. Pt's RLE buckled and pt needed body weight support from LiteGait to prevent fall, but denied any onset of BLE flexor "spasms". Second attempt to ambulate limited by equipment malfunction therefore deferred any further standing/walking in LiteGait for safety. Pt did perform x1 additional sit<>stand in stedy from elevated EOM with max/total A of 2 but was unable to come completley upright due to fatigue.   Returned to room and pt showed therapist pictures of upright standing frame walker he wishes to purchase. Recommended that pt only work on standing with therapy (due to heavy assist required and unpredictable BLE buckling/flexor tone) in stable support systems and to avoid attempting to ambulate without body weight support systems due to BLE flexor tone, BLE weakness RLE>LLE, and global weakness/deconditioning - pt verbalized understanding.   Concluded session with pt sitting in WC, needs within reach, and seatbelt alarm on.   Therapy Documentation Precautions:  Precautions Precautions: Fall, Back Precaution Comments: spinal Restrictions Weight Bearing Restrictions: No  Therapy/Group: Individual Therapy [name] Blima Rich PT, DPT 04/03/2023, 6:50 AM

## 2023-04-03 NOTE — Progress Notes (Signed)
Patient ID: Raymond [name], male   DOB: March 25, 1966, 57 y.o.   MRN: [id number]  Patient has requested to cancel HB order.

## 2023-04-03 NOTE — Progress Notes (Signed)
Occupational Therapy Session Note  Patient Details  Name: Raymond [name] MRN: [id number] Date of Birth: [birth year]  Today's Date: 04/03/2023 OT Individual Time: 0805-0900 OT Individual Time Calculation (min): 55 min    Short Term Goals: Week 2:  OT Short Term Goal 1 (Week 2): Pt will transfer to [facility] with LRAD and mod A OT Short Term Goal 2 (Week 2): Pt will complete LB self care with AE with mod A OT Short Term Goal 3 (Week 2): Pt will complete sit > stand in prep for ADL with max A OT Short Term Goal 4 (Week 2): Pt will recall 3/3 spinal precautions with supervision A  Skilled Therapeutic Interventions/Progress Updates:  Skilled OT intervention completed with focus on functional transfers, toileting. Pt received semi upright in bed, agreeable to session. No pain reported.  Pt transitioned > EOB with supervision via heavy assist of bed rails however per pt and wife request, pt DC home with hospital bed. Donned shoes with total A. Pt able to maintain sitting EOB without LOB with supervision during MD rounds in room.   Total A board placement, then CGA transfer on board with stabilization of board and w/c provided and +2 present however not used. Able to brush teeth, wash face and groom hair independently while seated.  Pt requested void, able to urinate in urinal with mod I, however upon trial reported having incontinent BM. Utilized STEDY with mod A +2, then had pt remain in standing to manage BLE flexor tone, however with dependent transfer over bathroom threshold and length of time to manage STEDY for transfer with +2, pt with inability to remain standing, and sat himself down in perched position and upon sitting, flexor tone immediately initiated. Min A needed to position feet and provide WB through the feet to prevent feet from continuing to tuck underneath him, then able to remain in perched position during dependent transfer > BSC. Mod A +2 sit > stand in STEDY, and +2 needed for lowering  pants. Pt with incontinent and further continent BM (charted). Pt able to manage all pericare with good adherence to spinal precautions with set up A. Required total A to thread LB clothing with suggested incontinence brief as pt with last pair of clean shorts. +2 mod A to stand in STEDY then again min A at feet in perched position then able to maintain perched sitting, and again +2 to stand to sit in w/c. Set up A for hand hygiene at sink.  Pt remained seated in w/c, with chair alarm on/activated, and with all needs in reach at end of session.   Therapy Documentation Precautions:  Precautions Precautions: Fall, Back Precaution Comments: spinal Restrictions Weight Bearing Restrictions: No    Therapy/Group: Individual Therapy  Melvyn Novas, MS, OTR/L  04/03/2023, 3:44 PM

## 2023-04-03 NOTE — Progress Notes (Signed)
PROGRESS NOTE   Subjective/Complaints: Looks bright and alert sitting at edge of bed this morning, discussed CBGs, decreasing Gipizide, mechanism of action of this medicine   ROS: +impaired ambulation, +chronic back pain, +bilateral knee buckling, +stool incontinence, denies lower extremity edema, denies abdominal pain, denies shortness of breath, +decreased sensation on sole of left foot, +spasticity, +GERD, lethargy improved   Objective:   DG Abd 1 View  Result Date: 04/02/2023 CLINICAL DATA:  Emesis. EXAM: ABDOMEN - 1 VIEW COMPARISON:  None Available. FINDINGS: The bowel gas pattern is normal. No radio-opaque calculi or other significant radiographic abnormality are seen. IMPRESSION: No abnormal bowel dilatation. Electronically Signed   By: Lupita Raider M.D.   On: 04/02/2023 12:46   Recent Labs    04/02/23 1849  WBC 12.7*  HGB 11.3*  HCT 34.7*  PLT 316    Recent Labs    04/01/23 0526 04/02/23 0527  NA 137 135  K 4.6 3.4*  CL 101 97*  CO2 19* 27  GLUCOSE 94 95  BUN 29* 29*  CREATININE 1.61* 1.73*  CALCIUM 9.3 9.4    Intake/Output Summary (Last 24 hours) at 04/03/2023 0912 Last data filed at 04/03/2023 0732 Gross per 24 hour  Intake 710 ml  Output --  Net 710 ml        Physical Exam: Vital Signs Blood pressure (!) 166/77, pulse 68, temperature 97.8 F (36.6 C), resp. rate 18, height 5\' 11"  (1.803 m), weight (!) 143.4 kg, SpO2 95%. Gen: no distress, normal appearing HEENT: oral mucosa pink and moist, NCAT Cardio: RRR Pulmonary:     CTAB, normal effort Chest:     Chest wall: Tenderness (left lateral chest) present.  Abdominal:     General: Bowel sounds are normal. There is no distension.     Palpations: Abdomen is soft.     Tenderness: There is no abdominal tenderness.  Musculoskeletal:        General: Tenderness (back) present. Normal range of motion.     Cervical back: Normal range of motion.      Right lower leg: Edema 1+ present.     Left lower leg: Left lower leg edema: trace.  Skin:    General: Skin is warm and dry.     Comments: Back incision C/D/Intact. Dry scab on left shin. Stasis changes BLE.   Neurological:     Mental Status: He is alert.     Comments: Alert and oriented x 3. Normal insight and awareness. Intact Memory. Normal language and speech. Cranial nerve exam unremarkable. MMT: UE 5/5 bilaterally. RLE 4-/5 HF, KE and 4/5 ADF/PF.  LLE 4/5 HF,KE, 4+ADF/PF.  DTR's 3+ in both LE's. 3-4 beats of clonus each foot. Mild extensor tone in LE's, big toes, knees buckling on standing attempts, decreased sensation in sole of left foot, stable 11/1 Psychiatric:        Mood and Affect: Mood normal.        Behavior: Behavior normal.        Thought Content: Thought content normal.    Assessment/Plan: 1. Functional deficits which require 3+ hours per day of interdisciplinary therapy in a comprehensive inpatient rehab setting. Physiatrist is  providing close team supervision and 24 hour management of active medical problems listed below. Physiatrist and rehab team continue to assess barriers to discharge/monitor patient progress toward functional and medical goals  Care Tool:  Bathing    Body parts bathed by patient: Right arm, Left arm, Chest, Abdomen, Front perineal area, Right upper leg, Left upper leg, Right lower leg, Left lower leg, Face   Body parts bathed by helper: Buttocks     Bathing assist Assist Level: Minimal Assistance - Patient > 75%     Upper Body Dressing/Undressing Upper body dressing   What is the patient wearing?: Pull over shirt    Upper body assist Assist Level: Set up assist    Lower Body Dressing/Undressing Lower body dressing      What is the patient wearing?: Underwear/pull up, Pants     Lower body assist Assist for lower body dressing: Maximal Assistance - Patient 25 - 49%     Toileting Toileting    Toileting assist Assist for  toileting: 2 Helpers     Transfers Chair/bed transfer  Transfers assist     Chair/bed transfer assist level: 2 Helpers     Locomotion Ambulation   Ambulation assist   Ambulation activity did not occur: Safety/medical concerns          Walk 10 feet activity   Assist  Walk 10 feet activity did not occur: Safety/medical concerns        Walk 50 feet activity   Assist Walk 50 feet with 2 turns activity did not occur: Safety/medical concerns         Walk 150 feet activity   Assist Walk 150 feet activity did not occur: Safety/medical concerns         Walk 10 feet on uneven surface  activity   Assist Walk 10 feet on uneven surfaces activity did not occur: Safety/medical concerns         Wheelchair     Assist Is the patient using a wheelchair?: Yes Type of Wheelchair: Manual    Wheelchair assist level: Supervision/Verbal cueing Max wheelchair distance: 118ft    Wheelchair 50 feet with 2 turns activity    Assist        Assist Level: Supervision/Verbal cueing   Wheelchair 150 feet activity     Assist      Assist Level: Supervision/Verbal cueing   Blood pressure (!) 166/77, pulse 68, temperature 97.8 F (36.6 C), resp. rate 18, height 5\' 11"  (1.803 m), weight (!) 143.4 kg, SpO2 95%.  Medical Problem List and Plan: 1. 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 Pt s/p T10 laminectomy and discectomy on 03/12/23.              -patient [place] shower             -ELOS/Goals: 17-24 days, goals supervision to min assist at w/c level             -Bilateral PRAFO's  Continue CIR  Grounds pass ordered  Discussed that he would like to purchase Stedy 2.  Multiple DVTs: Eliquis started             -antiplatelet therapy: N/A 3. Pain Management: Oxycodone prn--> has been using IV dilaudid couple of times a day. Kpad ordered  Prescribed home Zynex machine.              --He feels that dilaudid works  better but oxycodone last longer. Will schedule oxycodone 10 mg TID for  now              --Wean down as pain/activity improves.    4. Mood/Behavior/Sleep: LCSW to follow for evaluation and support.              -antipsychotic agents: N/A  -Patient seen by neuropsychology  5. Neuropsych/cognition: This patient is capable of making decisions on his own behalf. 6. Skin/Wound Care: back incision is CDI. [place] be left open to air  7. Fluids/Electrolytes/Nutrition: Monitor I/O. Check CMET in am  Insomnia: continue melatonin HS  9. HTN: Monitor BS TID--continue Amlodipine,  Coreg, Cardura and Lasix bid             --labile with SBP ranging 130-160 during the day. Monitor for orthostatic symptoms.   -10/28 SBP 140-146, amlodipine was stopped by nephrology, continue current regimen    04/03/2023    7:00 AM 04/03/2023    5:39 AM 04/02/2023    7:36 PM  Vitals with BMI  Weight 316 lbs 2 oz    BMI 44.11    Systolic  166 140  Diastolic  77 76  Pulse  68 75    10.  OSA: CPAP at nights-->Is compliant with his machine and advised him to ask his wife to bring it from home.  -Patient reports he got his CPAP machine from home 11. T2DM: Hgb A1C- 6.0 and well controlled.  Add CBG checks back as appetite has decreased due to GERD.  BS stable on glucotrol and metformin daily in am.  --monitor for changes with increase in activity.  -CBGs controlled CBG (last 3)  Recent Labs    04/02/23 2054 04/03/23 0537  GLUCAP 75 94     10. COPD: Has intermittent cough " from 40 yrs of smoking"             --resume MDI. 11. Hypokalemia: Likely due to diuretic/intake. Will continue K dur  30 meq BID --K- 3.3 on 10/09 (admission)-->recheck BMET in am 12.  AKI on CKD: Baseline SCr around 1.3--creatinine reviewed and improved, discussed with patient, consulted nephrology, lasix decreased to 20mg  BID, encouraged 6-8 glasses of water daily and repeat BMP tomorrow. Advised not to use Goodys or Mobic. Amlodipine  d/ced Lasix decreased to 10mg  BID given AKI and lower weights, repeat BMP today  -Has been seen by nephrology, BUNs/CR stable at 46/2.1, continue current regimen for now  Repeat BMP today  13. Leucocytosis: Monitor for fevers and other signs of infection. WBC was up to 13.3 after surgery.             --recheck WBC in am 14. Acute on chronic anemia: Recheck H/H in am 15. Constipation/neurogenic bowel.   -- On Senna 1 bid--> change to 2 in am. Sorbitol 45 cc tonight. -dulcolax supp in pm Bowel program restarted  16.  Neurogenic bladder: Continue Flomax and Cardura. Foley was placed at admission             -continue foley tonight.  -Voiding trial in AM -10/28 recent continent PVRs not elevated  17. Morbid obesity: BMI -44. Was 317 at admission and down to 315 lbs. He would like to lose more weight.  --Will need ongoing education on diet as well as importance of weight loss to promote mobility and wound healing  18.  Anxiety d/o: Continue Cymbalta with prn Xanax. Magnesium added given suboptimal levels  19.  Left chest wall/rib pain: Has been ongoing since 4 days and feels that is positional. Lidocaine patches  at night as pain worse then.  CXR reviewed and shows no fracture/infection  20. Hypoalbuminemia: encouraged prioritizing protein in diet  21. ESBL: completed nitrofurantoin  22. Polypharmacy: decrease lasix to 10mg  BID given decreased weight and AKI on CKD, examined lower extremities and edema remains stable -decrease cymbalta to 40mg  since he denies neuropathy, is unsure why he is on this, and it could be contributing to AKI/constipation   23.  Knee pain bilateral: Will start Voltaren gel.  24. Muscle spasms: replace baclofen with scheduled robaxin, schedule tizanidine 2mg  daily, increase to BID  25. Wheezing: decrease lasix back to 10mg  BID given worsening creatinine today, discussed that this has resolved  26. Decreased sensation in left foot: lumbar spinal XR ordered,  reviewed and shows degenerative changes  27. GERD: Pantoprazole ordered. Advised that his wife can bring his Prilosec from home instead since this is more effective for him  28. HTN: increase lasix back to 20mg  BID  29. Constipation: increase colace to 100mg  TID and add senna 2 tabs HS  LOS: 11 days A FACE TO FACE EVALUATION WAS PERFORMED  Dannisha Eckmann P Aliahna Statzer 04/03/2023, 9:12 AM

## 2023-04-03 NOTE — Progress Notes (Signed)
Hypoglycemic Event  CBG: 68  Treatment: 4 oz juice/soda  Symptoms: None  Follow-up CBG: Time:1653 CBG Result:170  Possible Reasons for Event: Unknown  Comments/MD notified:     Tori Milks

## 2023-04-03 NOTE — Progress Notes (Signed)
Physical Therapy Session Note  Patient Details  Name: Raymond [name] MRN: [id number] Date of Birth: 01-04-1966  Today's Date: 04/03/2023 PT Individual Time: 1415-1520 PT Individual Time Calculation (min): 65 min   Short Term Goals: Week 2:  PT Short Term Goal 1 (Week 2): STG=LTG due to LOS  Skilled Therapeutic Interventions/Progress Updates:    Pt seated EOB on arrival. Pt reports chronic knee pain, activity modification as needed.   slideboard transfer with mod A to w/c, pt demoes good awareness of safety and board placement, reporting that transfer had gone awry earlier in the day when the board was placed poorly.   Pt propelled w/c to/from day room with intermittent rest breaks for endurance and functional mobility.   Session focused on using to stedy to encourage standing/weight bearing through LE. Educated pt on weightbearing to normalize tone and build LE strength. Pt performed Sit to stand in stedy at mod a x2 fading to max A x2 by end of session, >6 stands. Pt remained in semi standing against paddles for up to 3 min performing ball toss activity. Also participated in anterior reaching for balance in this position.   Pt reports extreme fatigue on return to room and requests to remain in w/c d/t having visitors present. Pt missed x 10 min d/t fatigue. Remained in w/c was left with all needs in reach and alarm active.   Therapy Documentation Precautions:  Precautions Precautions: Fall, Back Precaution Comments: spinal Restrictions Weight Bearing Restrictions: No General: PT Amount of Missed Time (min): 10 Minutes PT Missed Treatment Reason: Patient fatigue    Therapy/Group: Individual Therapy  Juluis Rainier 04/03/2023, 3:38 PM

## 2023-04-03 NOTE — Progress Notes (Signed)
Patient ID: Raymond [name], male   DOB: 10-Jun-1965, 57 y.o.   MRN: [id number]  SW informed from [facility] company pt's insurance benefits do not include HH services. SW will inform pt and team.

## 2023-04-04 DIAGNOSIS — M4714 Other spondylosis with myelopathy, thoracic region: Secondary | ICD-10-CM | POA: Diagnosis not present

## 2023-04-04 LAB — GLUCOSE, CAPILLARY
Glucose-Capillary: 111 mg/dL — ABNORMAL HIGH (ref 70–99)
Glucose-Capillary: 74 mg/dL (ref 70–99)
Glucose-Capillary: 82 mg/dL (ref 70–99)
Glucose-Capillary: 113 mg/dL — ABNORMAL HIGH (ref 70–99)

## 2023-04-04 MED ORDER — LOSARTAN POTASSIUM 50 MG PO TABS
75.0000 mg | ORAL_TABLET | Freq: Every day | ORAL | Status: DC
  Administered 2023-04-05 – 2023-04-10 (×6): 75 mg via ORAL
  Filled 2023-04-04 (×6): qty 1

## 2023-04-04 MED ORDER — DOCUSATE SODIUM 100 MG PO CAPS
100.0000 mg | ORAL_CAPSULE | Freq: Two times a day (BID) | ORAL | Status: DC
  Administered 2023-04-04 – 2023-04-10 (×12): 100 mg via ORAL
  Filled 2023-04-04 (×12): qty 1

## 2023-04-04 MED ORDER — GLIPIZIDE 5 MG PO TABS
2.5000 mg | ORAL_TABLET | Freq: Every day | ORAL | Status: DC
  Administered 2023-04-05 – 2023-04-06 (×2): 2.5 mg via ORAL
  Filled 2023-04-04 (×2): qty 0.5
  Filled 2023-04-04: qty 1

## 2023-04-04 MED ORDER — AMLODIPINE BESYLATE 5 MG PO TABS: 5.0000 mg | ORAL_TABLET | Freq: Every day | ORAL | Status: DC

## 2023-04-04 MED ORDER — METFORMIN HCL ER 750 MG PO TB24
750.0000 mg | ORAL_TABLET | Freq: Every day | ORAL | Status: DC
  Administered 2023-04-05 – 2023-04-07 (×3): 750 mg via ORAL
  Filled 2023-04-04 (×3): qty 1

## 2023-04-04 MED ORDER — CAMPHOR-MENTHOL 0.5-0.5 % EX LOTN: TOPICAL_LOTION | CUTANEOUS | Status: DC | PRN

## 2023-04-04 MED ORDER — BACLOFEN 5 MG HALF TABLET
5.0000 mg | ORAL_TABLET | Freq: Four times a day (QID) | ORAL | Status: DC
  Administered 2023-04-04 – 2023-04-05 (×5): 5 mg via ORAL
  Filled 2023-04-04 (×5): qty 1

## 2023-04-04 NOTE — Progress Notes (Signed)
PROGRESS NOTE   Subjective/Complaints: Pt reports annoying and painful spasms- really impacting his ability to move and transfer.   B/L LE's- "weird acting"- wont sotp spasming.  LBM thi sAM- incontinent- on zanaflex 2 mg daily and 2 mg prn.     ROS:   Pt denies SOB, abd pain, CP, N/V/C/D, and vision changes  Except per HPI   Objective:   No results found. Recent Labs    04/02/23 1849  WBC 12.7*  HGB 11.3*  HCT 34.7*  PLT 316    Recent Labs    04/02/23 0527 04/03/23 0929  NA 135 134*  K 3.4* 3.9  CL 97* 98  CO2 27 22  GLUCOSE 95 97  BUN 29* 24*  CREATININE 1.73* 1.67*  CALCIUM 9.4 9.6    Intake/Output Summary (Last 24 hours) at 04/04/2023 1350 Last data filed at 04/04/2023 0900 Gross per 24 hour  Intake 480 ml  Output 1075 ml  Net -595 ml        Physical Exam: Vital Signs Blood pressure (!) 161/81, pulse 69, temperature 97.8 F (36.6 C), resp. rate 18, height 5\' 11"  (1.803 m), weight (!) 143.3 kg, SpO2 95%.    General: awake, alert, appropriate, sitting up in bed; NAD HENT: conjugate gaze; oropharynx moist CV: regular rate and rhythm; no JVD Pulmonary: CTA B/L; no W/R/R- good air movement GI: soft, NT, ND, (+)BS- protuberant- hypoactive BS Psychiatric: appropriate- frustrated about spasms Neurological: Ox3 MAS of  2 in LE's- with 5-6 beats of clonus in LE's-  Musculoskeletal:        General: Tenderness (back) present. Normal range of motion.     Cervical back: Normal range of motion.     Right lower leg: Edema 1+ present.     Left lower leg: Left lower leg edema: trace.  Skin:    General: Skin is warm and dry.     Comments: Back incision C/D/Intact. Dry scab on left shin. Stasis changes BLE.   Neurological:     Mental Status: He is alert.     Comments: Alert and oriented x 3. Normal insight and awareness. Intact Memory. Normal language and speech. Cranial nerve exam unremarkable. MMT:  UE 5/5 bilaterally. RLE 4-/5 HF, KE and 4/5 ADF/PF.  LLE 4/5 HF,KE, 4+ADF/PF.  DTR's 3+ in both LE's. 3-4 beats of clonus each foot. Mild extensor tone in LE's, big toes, knees buckling on standing attempts, decreased sensation in sole of left foot, stable 11/1 Psychiatric:        Mood and Affect: Mood normal.        Behavior: Behavior normal.        Thought Content: Thought content normal.    Assessment/Plan: 1. Functional deficits which require 3+ hours per day of interdisciplinary therapy in a comprehensive inpatient rehab setting. Physiatrist is providing close team supervision and 24 hour management of active medical problems listed below. Physiatrist and rehab team continue to assess barriers to discharge/monitor patient progress toward functional and medical goals  Care Tool:  Bathing    Body parts bathed by patient: Right arm, Left arm, Chest, Abdomen, Front perineal area, Right upper leg, Left upper leg, Right lower  leg, Left lower leg, Face   Body parts bathed by helper: Buttocks     Bathing assist Assist Level: Minimal Assistance - Patient > 75%     Upper Body Dressing/Undressing Upper body dressing   What is the patient wearing?: Pull over shirt    Upper body assist Assist Level: Set up assist    Lower Body Dressing/Undressing Lower body dressing      What is the patient wearing?: Underwear/pull up, Pants     Lower body assist Assist for lower body dressing: Maximal Assistance - Patient 25 - 49%     Toileting Toileting    Toileting assist Assist for toileting: 2 Helpers     Transfers Chair/bed transfer  Transfers assist     Chair/bed transfer assist level: 2 Helpers     Locomotion Ambulation   Ambulation assist   Ambulation activity did not occur: Safety/medical concerns  Assist level:  (+3 assist) Assistive device: Lite Gait Max distance: 24ft   Walk 10 feet activity   Assist  Walk 10 feet activity did not occur: Safety/medical  concerns        Walk 50 feet activity   Assist Walk 50 feet with 2 turns activity did not occur: Safety/medical concerns         Walk 150 feet activity   Assist Walk 150 feet activity did not occur: Safety/medical concerns         Walk 10 feet on uneven surface  activity   Assist Walk 10 feet on uneven surfaces activity did not occur: Safety/medical concerns         Wheelchair     Assist Is the patient using a wheelchair?: Yes Type of Wheelchair: Manual    Wheelchair assist level: Supervision/Verbal cueing Max wheelchair distance: 126ft    Wheelchair 50 feet with 2 turns activity    Assist        Assist Level: Supervision/Verbal cueing   Wheelchair 150 feet activity     Assist      Assist Level: Supervision/Verbal cueing   Blood pressure (!) 161/81, pulse 69, temperature 97.8 F (36.6 C), resp. rate 18, height 5\' 11"  (1.803 m), weight (!) 143.3 kg, SpO2 95%.  Medical Problem List and Plan: 1. 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 Pt s/p T10 laminectomy and discectomy on 03/12/23.              -patient [place] shower             -ELOS/Goals: 17-24 days, goals supervision to min assist at w/c level             -Bilateral PRAFO's  Con't CIR PT and OT  Grounds pass ordered  Discussed that he would like to purchase Stedy 2.  Multiple DVTs: Eliquis started             -antiplatelet therapy: N/A 3. Pain Management: Oxycodone prn--> has been using IV dilaudid couple of times a day. Kpad ordered  Prescribed home Zynex machine.              --He feels that dilaudid works better but oxycodone last longer. Will schedule oxycodone 10 mg TID for now              --Wean down as pain/activity improves.    11/2- pt feels spasticity is playing a part of his pain- will change spasticity meds around to help 4. Mood/Behavior/Sleep: LCSW to follow for evaluation and support.              -  antipsychotic agents:  N/A  -Patient seen by neuropsychology  5. Neuropsych/cognition: This patient is capable of making decisions on his own behalf. 6. Skin/Wound Care: back incision is CDI. [place] be left open to air  7. Fluids/Electrolytes/Nutrition: Monitor I/O. Check CMET in am  Insomnia: continue melatonin HS  9. HTN: Monitor BS TID--continue Amlodipine,  Coreg, Cardura and Lasix bid             --labile with SBP ranging 130-160 during the day. Monitor for orthostatic symptoms.   -10/28 SBP 140-146, amlodipine was stopped by nephrology, continue current regimen  11/2- BP somewhat elevated- 150s-178- cannot start Norvasc, already oN lasix and Cozaar- will increase Cozaar to 76 mg daily-wil recheck labs Monday- and monitor Cr BUN as well as K+.     04/04/2023    6:00 AM 04/04/2023    3:09 AM 04/03/2023    7:20 PM  Vitals with BMI  Weight 315 lbs 15 oz    BMI 44.08    Systolic 161 170 130  Diastolic 81 74 89  Pulse 69 63 67    10.  OSA: CPAP at nights-->Is compliant with his machine and advised him to ask his wife to bring it from home.  -Patient reports he got his CPAP machine from home 11. T2DM: Hgb A1C- 6.0 and well controlled.  Add CBG checks back as appetite has decreased due to GERD.  BS stable on glucotrol and metformin daily in am.  --monitor for changes with increase in activity.  -CBGs controlled 11/2- having low CBGs- 2x in last 24 hours- will reduce Glipizide to 2.5 mg daily and Metformin to 750 mg daily.  CBG (last 3)  Recent Labs    04/03/23 2046 04/04/23 0623 04/04/23 1143  GLUCAP 96 111* 74     10. COPD: Has intermittent cough " from 40 yrs of smoking"             --resume MDI. 11. Hypokalemia: Likely due to diuretic/intake. Will continue K dur  30 meq BID --K- 3.3 on 10/09 (admission)-->recheck BMET in am 12.  AKI on CKD: Baseline SCr around 1.3--creatinine reviewed and improved, discussed with patient, consulted nephrology, lasix decreased to 20mg  BID, encouraged 6-8 glasses of  water daily and repeat BMP tomorrow. Advised not to use Goodys or Mobic. Amlodipine d/ced Lasix decreased to 10mg  BID given AKI and lower weights, repeat BMP today  -Has been seen by nephrology, BUNs/CR stable at 46/2.1, continue current regimen for now  Repeat BMP today  13. Leucocytosis: Monitor for fevers and other signs of infection. WBC was up to 13.3 after surgery.             --recheck WBC in am 14. Acute on chronic anemia: Recheck H/H in am 15. Constipation/neurogenic bowel.   -- On Senna 1 bid--> change to 2 in am. Sorbitol 45 cc tonight. -dulcolax supp in pm Bowel program restarted 11/2- pt not on Dulcolax supp anymore- not sure when stopped- will change Colace back to BID from TID-due to mushy stools. Might benefit from restarting bowel program since ~50% incontinent and 50% continent-  16.  Neurogenic bladder: Continue Flomax and Cardura. Foley was placed at admission             -continue foley tonight.  -Voiding trial in AM -10/28 recent continent PVRs not elevated 11/2- appears like voiding - appears not getting bladder scans- but pt reports peeing- but really frequent- so will restart bladder scans- will be hard to check  since body habitus- with BMI 44- just hard to tell so will order q6 H and hopefully we can get figured out this weekend. -  17. Morbid obesity: BMI -44. Was 317 at admission and down to 315 lbs. He would like to lose more weight.  --Will need ongoing education on diet as well as importance of weight loss to promote mobility and wound healing  18.  Anxiety d/o: Continue Cymbalta with prn Xanax. Magnesium added given suboptimal levels  19.  Left chest wall/rib pain: Has been ongoing since 4 days and feels that is positional. Lidocaine patches at night as pain worse then.  CXR reviewed and shows no fracture/infection  20. Hypoalbuminemia: encouraged prioritizing protein in diet  21. ESBL: completed nitrofurantoin  22. Polypharmacy: decrease lasix to 10mg   BID given decreased weight and AKI on CKD, examined lower extremities and edema remains stable -decrease cymbalta to 40mg  since he denies neuropathy, is unsure why he is on this, and it could be contributing to AKI/constipation   23.  Knee pain bilateral: Will start Voltaren gel.  24. Spasticity- : replace baclofen with scheduled robaxin, schedule tizanidine 2mg  daily, is on prn otherwise  11/2- pt has significant spasticity- will restart Baclofen 5 mg QID (not sure what dose was on and when)- if tolerates, would increase to 10 mg TID-QID- has myelopathy, so spasticity expected- educated pt on spasticity  25. Wheezing: decrease lasix back to 10mg  BID given worsening creatinine today, discussed that this has resolved  26. Decreased sensation in left foot: lumbar spinal XR ordered, reviewed and shows degenerative changes  27. GERD: Pantoprazole ordered. Advised that his wife can bring his Prilosec from home instead since this is more effective for him   I spent a total of  51  minutes on total care today- >50% coordination of care- due to  D/w nursing about med changes- review of Neurogenic bowel, bladder and issues relating to that- as well as spasticity- education of pt about spasticity - how it increases/progressive over 1-2 years and SCI support group. Also educated how bowel and bladder directly related to SCI-    LOS: 12 days A FACE TO FACE EVALUATION WAS PERFORMED  Stephie Xu 04/04/2023, 1:50 PM

## 2023-04-05 DIAGNOSIS — M4714 Other spondylosis with myelopathy, thoracic region: Secondary | ICD-10-CM | POA: Diagnosis not present

## 2023-04-05 LAB — GLUCOSE, CAPILLARY
Glucose-Capillary: 113 mg/dL — ABNORMAL HIGH (ref 70–99)
Glucose-Capillary: 87 mg/dL (ref 70–99)

## 2023-04-05 MED ORDER — BACLOFEN 10 MG PO TABS
10.0000 mg | ORAL_TABLET | Freq: Four times a day (QID) | ORAL | Status: DC
  Administered 2023-04-05 – 2023-04-10 (×20): 10 mg via ORAL
  Filled 2023-04-05 (×20): qty 1

## 2023-04-05 NOTE — Progress Notes (Signed)
PROGRESS NOTE   Subjective/Complaints:  Pt reports spasms 35-40% better with baclofen-  Having itching on back- hasn't received srna lotion yet.  Said since in hospital, not smoking, drinking or eating a lot- - excited about his improvement.    ROS:   Pt denies SOB, abd pain, CP, N/V/C/D, and vision changes   Except per HPI   Objective:   No results found. Recent Labs    04/02/23 1849  WBC 12.7*  HGB 11.3*  HCT 34.7*  PLT 316    Recent Labs    04/03/23 0929  NA 134*  K 3.9  CL 98  CO2 22  GLUCOSE 97  BUN 24*  CREATININE 1.67*  CALCIUM 9.6    Intake/Output Summary (Last 24 hours) at 04/05/2023 1342 Last data filed at 04/05/2023 1220 Gross per 24 hour  Intake 716 ml  Output 1300 ml  Net -584 ml        Physical Exam: Vital Signs Blood pressure (!) 161/84, pulse 60, temperature 98.2 F (36.8 C), resp. rate 16, height 5\' 11"  (1.803 m), weight (!) 142.2 kg, SpO2 95%.     General: awake, alert, appropriate, sitting u in bed; NAD HENT: conjugate gaze; oropharynx moist CV: regular rate- borderline bradycardia; no JVD Pulmonary: CTA B/L; no W/R/R- good air movement GI: soft, NT, ND, (+)BS- protuberant Psychiatric: appropriate Neurological: Ox3  MAS of  1+ in LE's- with 6-8 beats of clonus in LE's- looks like more clonus, but less tone Musculoskeletal:        General: Tenderness (back) present. Normal range of motion.     Cervical back: Normal range of motion.     Right lower leg: Edema 1+ present.     Left lower leg: Left lower leg edema: trace.  Skin:    General: Skin is warm and dry.     Comments: Back incision C/D/Intact. Dry scab on left shin. Stasis changes BLE.   Neurological:     Mental Status: He is alert.     Comments: Alert and oriented x 3. Normal insight and awareness. Intact Memory. Normal language and speech. Cranial nerve exam unremarkable. MMT: UE 5/5 bilaterally. RLE 4-/5 HF,  KE and 4/5 ADF/PF.  LLE 4/5 HF,KE, 4+ADF/PF.  DTR's 3+ in both LE's. 3-4 beats of clonus each foot. Mild extensor tone in LE's, big toes, knees buckling on standing attempts, decreased sensation in sole of left foot, stable 11/1 Psychiatric:        Mood and Affect: Mood normal.        Behavior: Behavior normal.        Thought Content: Thought content normal.    Assessment/Plan: 1. Functional deficits which require 3+ hours per day of interdisciplinary therapy in a comprehensive inpatient rehab setting. Physiatrist is providing close team supervision and 24 hour management of active medical problems listed below. Physiatrist and rehab team continue to assess barriers to discharge/monitor patient progress toward functional and medical goals  Care Tool:  Bathing    Body parts bathed by patient: Right arm, Left arm, Chest, Abdomen, Front perineal area, Right upper leg, Left upper leg, Right lower leg, Left lower leg, Face   Body parts  bathed by helper: Buttocks     Bathing assist Assist Level: Minimal Assistance - Patient > 75%     Upper Body Dressing/Undressing Upper body dressing   What is the patient wearing?: Pull over shirt    Upper body assist Assist Level: Set up assist    Lower Body Dressing/Undressing Lower body dressing      What is the patient wearing?: Underwear/pull up, Pants     Lower body assist Assist for lower body dressing: Maximal Assistance - Patient 25 - 49%     Toileting Toileting    Toileting assist Assist for toileting: 2 Helpers     Transfers Chair/bed transfer  Transfers assist     Chair/bed transfer assist level: 2 Helpers     Locomotion Ambulation   Ambulation assist   Ambulation activity did not occur: Safety/medical concerns  Assist level:  (+3 assist) Assistive device: Lite Gait Max distance: 49ft   Walk 10 feet activity   Assist  Walk 10 feet activity did not occur: Safety/medical concerns        Walk 50 feet  activity   Assist Walk 50 feet with 2 turns activity did not occur: Safety/medical concerns         Walk 150 feet activity   Assist Walk 150 feet activity did not occur: Safety/medical concerns         Walk 10 feet on uneven surface  activity   Assist Walk 10 feet on uneven surfaces activity did not occur: Safety/medical concerns         Wheelchair     Assist Is the patient using a wheelchair?: Yes Type of Wheelchair: Manual    Wheelchair assist level: Supervision/Verbal cueing Max wheelchair distance: 149ft    Wheelchair 50 feet with 2 turns activity    Assist        Assist Level: Supervision/Verbal cueing   Wheelchair 150 feet activity     Assist      Assist Level: Supervision/Verbal cueing   Blood pressure (!) 161/84, pulse 60, temperature 98.2 F (36.8 C), resp. rate 16, height 5\' 11"  (1.803 m), weight (!) 142.2 kg, SpO2 95%.  Medical Problem List and Plan: 1. 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 Pt s/p T10 laminectomy and discectomy on 03/12/23.              -patient Jentsch shower             -ELOS/Goals: 17-24 days, goals supervision to min assist at w/c level             -Bilateral PRAFO's  Con't CIR PT and OT- a lot of education of spasticity over the weekend- and how that is progressive- explained he can always join our SCI support group usually on last Thursday of month, but in November will be on 11/21 at 6-7pm at [facility] pass ordered  Discussed that he would like to purchase Stedy 2.  Multiple DVTs: Eliquis started             -antiplatelet therapy: N/A 3. Pain Management: Oxycodone prn--> has been using IV dilaudid couple of times a day. Kpad ordered  Prescribed home Zynex machine.              --He feels that dilaudid works better but oxycodone last longer. Will schedule oxycodone 10 mg TID for now              --Wean  down as  pain/activity improves.    11/2- pt feels spasticity is playing a part of his pain- will change spasticity meds around to help 4. Mood/Behavior/Sleep: LCSW to follow for evaluation and support.              -antipsychotic agents: N/A  -Patient seen by neuropsychology  5. Neuropsych/cognition: This patient is capable of making decisions on his own behalf. 6. Skin/Wound Care: back incision is CDI. Derick be left open to air  7. Fluids/Electrolytes/Nutrition: Monitor I/O. Check CMET in am  Insomnia: continue melatonin HS  9. HTN: Monitor BS TID--continue Amlodipine,  Coreg, Cardura and Lasix bid             --labile with SBP ranging 130-160 during the day. Monitor for orthostatic symptoms.   -10/28 SBP 140-146, amlodipine was stopped by nephrology, continue current regimen  11/2- BP somewhat elevated- 150s-178- cannot start Norvasc, already oN lasix and Cozaar- will increase Cozaar to 76 mg daily-wil recheck labs Monday- and monitor Cr BUN as well as K+.   11/3- labs in AM- might suggest reduction in Lasix so can do increased Cozaar?     04/05/2023    1:03 PM 04/05/2023    5:19 AM 04/04/2023    9:13 PM  Vitals with BMI  Weight  313 lbs 8 oz   BMI  43.74   Systolic 161 166   166 141  Diastolic 84 77   77 70  Pulse 60 82   82     10.  OSA: CPAP at nights-->Is compliant with his machine and advised him to ask his wife to bring it from home.  -Patient reports he got his CPAP machine from home 11. T2DM: Hgb A1C- 6.0 and well controlled.  Add CBG checks back as appetite has decreased due to GERD.  BS stable on glucotrol and metformin daily in am.  --monitor for changes with increase in activity.  -CBGs controlled 11/2- having low CBGs- 2x in last 24 hours- will reduce Glipizide to 2.5 mg daily and Metformin to 750 mg daily.  11/3- Cbgs doing bette-r not too low in last 24 hours- better with reduction in Glipizide and Metformin CBG (last 3)  Recent Labs    04/04/23 1624 04/04/23 2051  04/05/23 0612  GLUCAP 82 113* 113*     10. COPD: Has intermittent cough " from 40 yrs of smoking"             --resume MDI. 11. Hypokalemia: Likely due to diuretic/intake. Will continue K dur  30 meq BID --K- 3.3 on 10/09 (admission)-->recheck BMET in am 12.  AKI on CKD: Baseline SCr around 1.3--creatinine reviewed and improved, discussed with patient, consulted nephrology, lasix decreased to 20mg  BID, encouraged 6-8 glasses of water daily and repeat BMP tomorrow. Advised not to use Goodys or Mobic. Amlodipine d/ced Lasix decreased to 10mg  BID given AKI and lower weights, repeat BMP today  -Has been seen by nephrology, BUNs/CR stable at 46/2.1, continue current regimen for now  Repeat BMP today  11/3- has labs in AM 13. Leucocytosis: Monitor for fevers and other signs of infection. WBC was up to 13.3 after surgery.             11/3- ordered labs for tomorrow 14. Acute on chronic anemia: Recheck H/H in am  11/3- labs in AM 15. Constipation/neurogenic bowel.   -- On Senna 1 bid--> change to 2 in am. Sorbitol 45 cc tonight. -dulcolax supp in pm Bowel program restarted  11/2- pt not on Dulcolax supp anymore- not sure when stopped- will change Colace back to BID from TID-due to mushy stools. Might benefit from restarting bowel program since ~50% incontinent and 50% continent-  11/3- Mushy and type 4 Bms- continent yesterday- which is better-  16.  Neurogenic bladder: Continue Flomax and Cardura. Foley was placed at admission             -continue foley tonight.  -Voiding trial in AM -10/28 recent continent PVRs not elevated 11/2- appears like voiding - appears not getting bladder scans- but pt reports peeing- but really frequent- so will restart bladder scans- will be hard to check since body habitus- with BMI 44- just hard to tell so will order q6 H and hopefully we can get figured out this weekend. 11/3- one PVRs/bladder scan noted at 19cc- will con't to follow -  17. Morbid obesity: BMI  -44. Was 317 at admission and down to 315 lbs. He would like to lose more weight.  --Will need ongoing education on diet as well as importance of weight loss to promote mobility and wound healing 11/3- reports was ~ 350 lbs when entered hospital- now 315- happy with results.  18.  Anxiety d/o: Continue Cymbalta with prn Xanax. Magnesium added given suboptimal levels  19.  Left chest wall/rib pain: Has been ongoing since 4 days and feels that is positional. Lidocaine patches at night as pain worse then.  CXR reviewed and shows no fracture/infection  20. Hypoalbuminemia: encouraged prioritizing protein in diet  21. ESBL: completed nitrofurantoin  22. Polypharmacy: decrease lasix to 10mg  BID given decreased weight and AKI on CKD, examined lower extremities and edema remains stable -decrease cymbalta to 40mg  since he denies neuropathy, is unsure why he is on this, and it could be contributing to AKI/constipation    23.  Knee pain bilateral: Will start Voltaren gel.  24. Spasticity- : replace baclofen with scheduled robaxin, schedule tizanidine 2mg  daily, is on prn otherwise  11/2- pt has significant spasticity- will restart Baclofen 5 mg QID (not sure what dose was on and when)- if tolerates, would increase to 10 mg TID-QID- has myelopathy, so spasticity expected- educated pt on spasticity  11/3- will increase baclofen to 10 mg QID- per pt request- was 35-40% better already but wants ot increase to make sure he sees if can get even better.    25. Wheezing: decrease lasix back to 10mg  BID given worsening creatinine today, discussed that this has resolved  26. Decreased sensation in left foot: lumbar spinal XR ordered, reviewed and shows degenerative changes  27. GERD: Pantoprazole ordered. Advised that his wife can bring his Prilosec from home instead since this is more effective for him   I spent a total of 41    minutes on total care today- >50% coordination of care- due to education more  on spasticity- we discussed options of keeping baclofen stable vs increasing- he wants to increase even though usually wait a few days to do so-  He also wants his spasticity controlled, so willing to be somewhat sleepy and "learn to acclimate" to it.     LOS: 13 days A FACE TO FACE EVALUATION WAS PERFORMED  Luvern Mcisaac 04/05/2023, 1:42 PM

## 2023-04-06 DIAGNOSIS — M4714 Other spondylosis with myelopathy, thoracic region: Secondary | ICD-10-CM | POA: Diagnosis not present

## 2023-04-06 LAB — CBC WITH DIFFERENTIAL/PLATELET
Abs Immature Granulocytes: 0.22 10*3/uL — ABNORMAL HIGH (ref 0.00–0.07)
Basophils Absolute: 0.1 10*3/uL (ref 0.0–0.1)
Basophils Relative: 1 %
Eosinophils Absolute: 0.5 10*3/uL (ref 0.0–0.5)
Eosinophils Relative: 5 %
HCT: 33.7 % — ABNORMAL LOW (ref 39.0–52.0)
Hemoglobin: 11 g/dL — ABNORMAL LOW (ref 13.0–17.0)
Immature Granulocytes: 2 %
Lymphocytes Relative: 16 %
Lymphs Abs: 1.5 10*3/uL (ref 0.7–4.0)
MCH: 28.8 pg (ref 26.0–34.0)
MCHC: 32.6 g/dL (ref 30.0–36.0)
MCV: 88.2 fL (ref 80.0–100.0)
Monocytes Absolute: 0.7 10*3/uL (ref 0.1–1.0)
Monocytes Relative: 8 %
Neutro Abs: 6.4 10*3/uL (ref 1.7–7.7)
Neutrophils Relative %: 68 %
Platelets: 316 10*3/uL (ref 150–400)
RBC: 3.82 MIL/uL — ABNORMAL LOW (ref 4.22–5.81)
RDW: 15.7 % — ABNORMAL HIGH (ref 11.5–15.5)
WBC: 9.4 10*3/uL (ref 4.0–10.5)
nRBC: 0 % (ref 0.0–0.2)

## 2023-04-06 LAB — GLUCOSE, CAPILLARY
Glucose-Capillary: 111 mg/dL — ABNORMAL HIGH (ref 70–99)
Glucose-Capillary: 106 mg/dL — ABNORMAL HIGH (ref 70–99)

## 2023-04-06 LAB — BASIC METABOLIC PANEL
Anion gap: 10 (ref 5–15)
BUN: 22 mg/dL — ABNORMAL HIGH (ref 6–20)
CO2: 27 mmol/L (ref 22–32)
Calcium: 9.5 mg/dL (ref 8.9–10.3)
Chloride: 99 mmol/L (ref 98–111)
Creatinine, Ser: 1.79 mg/dL — ABNORMAL HIGH (ref 0.61–1.24)
GFR, Estimated: 43 mL/min — ABNORMAL LOW (ref >60)
Glucose, Bld: 102 mg/dL — ABNORMAL HIGH (ref 70–99)
Potassium: 3.1 mmol/L — ABNORMAL LOW (ref 3.5–5.1)
Sodium: 136 mmol/L (ref 135–145)

## 2023-04-06 MED ORDER — POTASSIUM CHLORIDE 20 MEQ PO PACK
40.0000 meq | PACK | Freq: Two times a day (BID) | ORAL | Status: AC
  Administered 2023-04-06 (×2): 40 meq via ORAL
  Filled 2023-04-06 (×2): qty 2

## 2023-04-06 MED ORDER — FUROSEMIDE 20 MG PO TABS
10.0000 mg | ORAL_TABLET | Freq: Two times a day (BID) | ORAL | Status: DC
  Administered 2023-04-06 – 2023-04-10 (×8): 10 mg via ORAL
  Filled 2023-04-06 (×8): qty 1

## 2023-04-06 MED ORDER — POLYETHYLENE GLYCOL 3350 17 G PO PACK
17.0000 g | PACK | Freq: Every day | ORAL | Status: DC
  Administered 2023-04-06 – 2023-04-10 (×5): 17 g via ORAL
  Filled 2023-04-06 (×5): qty 1

## 2023-04-06 NOTE — Progress Notes (Signed)
Occupational Therapy Weekly Progress Note  Patient Details  Name: Raymond [name] MRN: [id number] Date of Birth: 12/25/1965  Beginning of progress report period: March 31, 2023 End of progress report period: April 06, 2023   Patient has met 3 of 4 short term goals.  Pt is making substantial progress towards LTGs. He is able to bathe at an overall min A level, dress LB with mod A in STEDY and requires mod assist for toileting tasks. As of date, pt has been able to stand with min A using bari RW from elevated mat and maintain balance with min A without BLE spasms, however has not been able to perform this consistently. He continues to demonstrate deficits in global endurance/strength and dynamic standing resulting in difficulty completing BADL tasks without increased physical assist. Pt will benefit from continued skilled OT services to focus on mentioned deficits. Family ed not initiated as of date and will be needed in prep for DC.  Patient continues to demonstrate the following deficits: muscle weakness, decreased cardiorespiratoy endurance, decreased coordination, and decreased standing balance and therefore will continue to benefit from skilled OT intervention to enhance overall performance with BADL and Reduce care partner burden.  Patient progressing toward long term goals..  Continue plan of care.  OT Short Term Goals Week 1:  OT Short Term Goal 1 (Week 1): Pt will transfer to [facility] with LRAD and mod A OT Short Term Goal 1 - Progress (Week 1): Not met OT Short Term Goal 2 (Week 1): Pt will complete LB self care with AE with mod A OT Short Term Goal 2 - Progress (Week 1): Progressing toward goal OT Short Term Goal 3 (Week 1): Pt will stand with mod A with LRAD up to 1 min OT Short Term Goal 3 - Progress (Week 1): Not met Week 2:  OT Short Term Goal 1 (Week 2): Pt will transfer to [facility] with LRAD and mod A OT Short Term Goal 1 - Progress (Week 2): Progressing toward goal OT Short Term Goal  2 (Week 2): Pt will complete LB self care with AE with mod A OT Short Term Goal 2 - Progress (Week 2): Met OT Short Term Goal 3 (Week 2): Pt will complete sit > stand in prep for ADL with max A OT Short Term Goal 3 - Progress (Week 2): Met OT Short Term Goal 4 (Week 2): Pt will recall 3/3 spinal precautions with supervision A OT Short Term Goal 4 - Progress (Week 2): Met Week 3:  OT Short Term Goal 1 (Week 3): STG = LTG due to ELOS    Raymond Decuir E Mehek Grega, MS, OTR/L  04/06/2023, 12:25 PM

## 2023-04-06 NOTE — Progress Notes (Signed)
Physical Therapy Session Note  Patient Details  Name: Raymond [name] MRN: [id number] Date of Birth: [birth year]  Today's Date: 04/06/2023 PT Individual Time: 1101-1157 and [id number] PT Individual Time Calculation (min): 56 min and 42 min  Short Term Goals: Week 1:  PT Short Term Goal 1 (Week 1): Pt will perform bed mobility with consistent MinA and abiding by spinal precautions. PT Short Term Goal 1 - Progress (Week 1): Progressing toward goal PT Short Term Goal 2 (Week 1): Pt will perform sit<>stand from elevated height with knee guard and ModA +2 PT Short Term Goal 2 - Progress (Week 1): Met PT Short Term Goal 3 (Week 1): Pt will perform slide board transfers in either direction with MinA/ CGA. PT Short Term Goal 3 - Progress (Week 1): Met PT Short Term Goal 4 (Week 1): Pt will propel w/c >25 ft with MinA. PT Short Term Goal 4 - Progress (Week 1): Met Week 2:  PT Short Term Goal 1 (Week 2): STG=LTG due to LOS  Skilled Therapeutic Interventions/Progress Updates:   Treatment Session 1 Received pt sitting in WC, pt agreeable to PT treatment, and denied any pain during session. Session with emphasis on functional mobility/transfers, global strengthening and endurance, simulated car transfers, and standing tolerance. Pt in good spirits, excited from standing with OT this morning using RW!  Pt transported to/from room in [facility] dependently for time management purposes. Pt performed simulated car transfer via slideboard with assist to place/remove board and CGA for transfer but required min A to get RLE into car but was able to get both legs out without assist. In main gym, pt performed slideboard transfer to/from mat with CGA and assist to place/remove board and to stabilize WC. Stood from elevated EOM with bari RW and mod A with +2 blocking at knees x 3 trials. Pt able to remain standing for ~30 seconds on trial 1, 10 seconds on trial 2, and 45 seconds on trial 3. Pt continues to demo difficulty with  eccentric control when transitioning from stand<>sit and required extended rest breaks in between each standing trial due to fatigue. Returned to room and concluded session with pt sitting in WC, needs within reach, and seatbelt alarm on.   Treatment Session 2 Received pt supine in bed, pt agreeable to PT treatment, and reported discomfort in low back (premedicated and unrated). Session with emphasis on functional mobility/transfers, dressing, global strengthening and endurance, and standing tolerance. Pt transferred semi-reclined<>sitting L EOB with HOB elevated and use of bedrails with supervision/mod I. Donned pants sitting EOB with max A to adhere to back precautions. Pt required x 3 attempts and mod A (+2 to block bilateral knees from buckling) to stand from elevated EOB with bari RW and pulled pants over hips dependently. Pt performed slideboard transfer bed<>WC with CGA and assist to place/remove board and stabilize WC. Pt then performed seated hip abduction 2x15 with red TB, seated alternating hip flexion 2x10 with red TB, and seated hip adduction pillow squeezes x10 with 5 second isometric hold. Concluded session with pt sitting in WC, needs within reach, and seatbelt alarm on awaiting upcoming OT session .   Therapy Documentation Precautions:  Precautions Precautions: Fall, Back Precaution Comments: spinal Restrictions Weight Bearing Restrictions: No  Therapy/Group: Individual Therapy [name] Blima Rich PT, DPT 04/06/2023, 6:49 AM

## 2023-04-06 NOTE — Progress Notes (Signed)
PROGRESS NOTE   Subjective/Complaints: Had his best day in therapy today- stood twice with MinA! Discussed excellent CBG control and d/c of glypizide   ROS: +impaired ambulation, +chronic back pain, +bilateral knee buckling, +stool incontinence, denies lower extremity edema, denies abdominal pain, denies shortness of breath, +decreased sensation on sole of left foot, +spasticity- improved, +GERD, lethargy improved   Objective:   No results found. Recent Labs    04/06/23 0526  WBC 9.4  HGB 11.0*  HCT 33.7*  PLT 316    Recent Labs    04/06/23 0526  NA 136  K 3.1*  CL 99  CO2 27  GLUCOSE 102*  BUN 22*  CREATININE 1.79*  CALCIUM 9.5    Intake/Output Summary (Last 24 hours) at 04/06/2023 0939 Last data filed at 04/06/2023 0725 Gross per 24 hour  Intake 660 ml  Output 3175 ml  Net -2515 ml        Physical Exam: Vital Signs Blood pressure (!) 156/75, pulse (!) 59, temperature 98 F (36.7 C), resp. rate 17, height 5\' 11"  (1.803 m), weight (!) 141 kg, SpO2 93%. Gen: no distress, normal appearing HEENT: oral mucosa pink and moist, NCAT Cardio: RRR Pulmonary:     CTAB, normal effort Chest:     Chest wall: Tenderness (left lateral chest) present.  Abdominal:     General: Bowel sounds are normal. There is no distension.     Palpations: Abdomen is soft.     Tenderness: There is no abdominal tenderness.  Musculoskeletal:        General: Tenderness (back) present. Normal range of motion.     Cervical back: Normal range of motion.     Right lower leg: Edema 1+ present.     Left lower leg: Left lower leg edema: trace.  Skin:    General: Skin is warm and dry.     Comments: Back incision C/D/Intact. Dry scab on left shin. Stasis changes BLE.   Neurological:     Mental Status: He is alert.     Comments: Alert and oriented x 3. Normal insight and awareness. Intact Memory. Normal language and speech. Cranial nerve  exam unremarkable. MMT: UE 5/5 bilaterally. RLE 4-/5 HF, KE and 4/5 ADF/PF.  LLE 4/5 HF,KE, 4+ADF/PF.  DTR's 3+ in both LE's. 3-4 beats of clonus each foot. Mild extensor tone in LE's, big toes, knees buckling on standing attempts, decreased sensation in sole of left foot, stable 11/4 Psychiatric:        Mood and Affect: Mood normal.        Behavior: Behavior normal.        Thought Content: Thought content normal.    Assessment/Plan: 1. Functional deficits which require 3+ hours per day of interdisciplinary therapy in a comprehensive inpatient rehab setting. Physiatrist is providing close team supervision and 24 hour management of active medical problems listed below. Physiatrist and rehab team continue to assess barriers to discharge/monitor patient progress toward functional and medical goals  Care Tool:  Bathing    Body parts bathed by patient: Right arm, Left arm, Chest, Abdomen, Front perineal area, Right upper leg, Left upper leg, Right lower leg, Left lower leg, Face  Body parts bathed by helper: Buttocks     Bathing assist Assist Level: Minimal Assistance - Patient > 75%     Upper Body Dressing/Undressing Upper body dressing   What is the patient wearing?: Pull over shirt    Upper body assist Assist Level: Set up assist    Lower Body Dressing/Undressing Lower body dressing      What is the patient wearing?: Underwear/pull up, Pants     Lower body assist Assist for lower body dressing: Maximal Assistance - Patient 25 - 49%     Toileting Toileting    Toileting assist Assist for toileting: 2 Helpers     Transfers Chair/bed transfer  Transfers assist     Chair/bed transfer assist level: 2 Helpers     Locomotion Ambulation   Ambulation assist   Ambulation activity did not occur: Safety/medical concerns  Assist level:  (+3 assist) Assistive device: Lite Gait Max distance: 55ft   Walk 10 feet activity   Assist  Walk 10 feet activity did not  occur: Safety/medical concerns        Walk 50 feet activity   Assist Walk 50 feet with 2 turns activity did not occur: Safety/medical concerns         Walk 150 feet activity   Assist Walk 150 feet activity did not occur: Safety/medical concerns         Walk 10 feet on uneven surface  activity   Assist Walk 10 feet on uneven surfaces activity did not occur: Safety/medical concerns         Wheelchair     Assist Is the patient using a wheelchair?: Yes Type of Wheelchair: Manual    Wheelchair assist level: Supervision/Verbal cueing Max wheelchair distance: 137ft    Wheelchair 50 feet with 2 turns activity    Assist        Assist Level: Supervision/Verbal cueing   Wheelchair 150 feet activity     Assist      Assist Level: Supervision/Verbal cueing   Blood pressure (!) 156/75, pulse (!) 59, temperature 98 F (36.7 C), resp. rate 17, height 5\' 11"  (1.803 m), weight (!) 141 kg, SpO2 93%.  Medical Problem List and Plan: 1. 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 Pt s/p T10 laminectomy and discectomy on 03/12/23.              -patient Rogel shower             -ELOS/Goals: 17-24 days, goals supervision to min assist at w/c level             -Bilateral PRAFO's  Continue CIR  Grounds pass ordered  Discussed that he would like to purchase Stedy 2.  Multiple DVTs: Eliquis started             -antiplatelet therapy: N/A 3. Pain Management: Oxycodone prn--> has been using IV dilaudid couple of times a day. Kpad ordered  Prescribed home Zynex machine.              --He feels that dilaudid works better but oxycodone last longer. Will schedule oxycodone 10 mg TID for now              --Wean down as pain/activity improves.    4. Mood/Behavior/Sleep: LCSW to follow for evaluation and support.              -antipsychotic agents: N/A  -Patient seen by neuropsychology  5. Neuropsych/cognition: This patient is capable  of making  decisions on his own behalf. 6. Skin/Wound Care: back incision is CDI. Tarry be left open to air  7. Fluids/Electrolytes/Nutrition: Monitor I/O. Check CMET in am  Insomnia: continue melatonin HS  9. HTN: Monitor BS TID--continue Amlodipine,  Coreg, Cardura and Lasix bid             --labile with SBP ranging 130-160 during the day. Monitor for orthostatic symptoms.   -10/28 SBP 140-146, amlodipine was stopped by nephrology, continue current regimen    04/06/2023    6:00 AM 04/06/2023    3:57 AM 04/05/2023    8:08 PM  Vitals with BMI  Weight 310 lbs 14 oz    BMI 43.37    Systolic  156 185  Diastolic  75 88  Pulse  59 77    10.  OSA: CPAP at nights-->Is compliant with his machine and advised him to ask his wife to bring it from home.  -Patient reports he got his CPAP machine from home 11. T2DM: Hgb A1C- 6.0 and well controlled.  Add CBG checks back as appetite has decreased due to GERD.  BS stable on glucotrol and metformin daily in am.  --monitor for changes with increase in activity.  -d/c glipizide.  CBG (last 3)  Recent Labs    04/05/23 0612 04/05/23 1626 04/06/23 0614  GLUCAP 113* 87 111*     10. COPD: Has intermittent cough " from 40 yrs of smoking"             --resume MDI. 11. Hypokalemia: Likely due to diuretic/intake. Will continue K dur  30 meq BID --K- 3.3 on 10/09 (admission)-->recheck BMET in am 12.  AKI on CKD: Baseline SCr around 1.3--creatinine reviewed and improved, discussed with patient, consulted nephrology, lasix decreased to 20mg  BID, encouraged 6-8 glasses of water daily and repeat BMP tomorrow. Advised not to use Goodys or Mobic. Amlodipine d/ced Lasix decreased to 10mg  BID given AKI and lower weights  -Has been seen by nephrology, BUNs/CR stable at 46/2.1, continue current regimen for now  Repeat creatinine tomorrow  13. Leucocytosis: Monitor for fevers and other signs of infection. WBC was up to 13.3 after surgery.             --recheck WBC  in am 14. Acute on chronic anemia: Recheck H/H in am 15. Constipation/neurogenic bowel.   -- On Senna 1 bid--> change to 2 in am. Sorbitol 45 cc tonight. -dulcolax supp in pm Bowel program restarted  16.  Neurogenic bladder: Continue Flomax and Cardura. Foley was placed at admission             -continue foley tonight.  -Voiding trial in AM -10/28 recent continent PVRs not elevated  17. Morbid obesity: BMI -44. Was 317 at admission and down to 315 lbs. He would like to lose more weight.  --Will need ongoing education on diet as well as importance of weight loss to promote mobility and wound healing  18.  Anxiety d/o: Continue Cymbalta with prn Xanax. Magnesium added given suboptimal levels  19.  Left chest wall/rib pain: Has been ongoing since 4 days and feels that is positional. Lidocaine patches at night as pain worse then.  CXR reviewed and shows no fracture/infection  20. Hypoalbuminemia: encouraged prioritizing protein in diet  21. ESBL: completed nitrofurantoin  22. Polypharmacy: decrease lasix to 10mg  BID given decreased weight and AKI on CKD, examined lower extremities and edema remains stable -decrease cymbalta to 40mg  since he denies neuropathy, is unsure why he  is on this, and it could be contributing to AKI/constipation   23.  Knee pain bilateral: Will start Voltaren gel.  24. Muscle spasms: replace baclofen with scheduled robaxin, schedule tizanidine 2mg  daily, increase to BID  25. Wheezing: resolved, decrease lasix back to 10mg  BID given worsening creatinine  26. Decreased sensation in left foot: lumbar spinal XR ordered, reviewed and shows degenerative changes  27. GERD: Pantoprazole ordered. Advised that his wife can bring his Prilosec from home instead since this is more effective for him  28. HTN: continue lasix, add klor BID for 2 doses  29. Constipation: increase colace to 100mg  TID and add senna 2 tabs HS, last BM 11/2, add miralax daily  30.  Hypokalemia: supplement klor BID for 2 doses  LOS: 14 days A FACE TO FACE EVALUATION WAS PERFORMED  Drema Pry Faraz Ponciano 04/06/2023, 9:39 AM

## 2023-04-06 NOTE — Progress Notes (Signed)
Occupational Therapy Session Note  Patient Details  Name: Raymond [name] MRN: [id number] Date of Birth: 11-Sep-1965  Today's Date: 04/06/2023 OT Individual Time: 0805-0900 & 1450-1530 OT Individual Time Calculation (min): 55 min & 40 min   Short Term Goals: Week 2:  OT Short Term Goal 1 (Week 2): Pt will transfer to [facility] with LRAD and mod A OT Short Term Goal 2 (Week 2): Pt will complete LB self care with AE with mod A OT Short Term Goal 3 (Week 2): Pt will complete sit > stand in prep for ADL with max A OT Short Term Goal 4 (Week 2): Pt will recall 3/3 spinal precautions with supervision A  Skilled Therapeutic Interventions/Progress Updates:  Session 1 Skilled OT intervention completed with focus on ADL retraining, functional transfers and sit <> stands with RW. Pt received upright in bed, agreeable to session. No pain reported!  Pt in great spirits and highly motivated this session stating "Jesus healed my back!" Transitioned to EOB with supervision with heavy use of bed rails however good adherence to spinal precautions. Able to thread shorts sitting EOB via 1/2 circle sit on each side with supervision. Threaded knee TEDs with total A and shoes with max A.   From elevated bed pt able to stand in STEDY with min A of 1(!) then +2 for donning pants over hips. Dependent transfer > w/c without spasms in BLE this date. Min A of 1 to stand from perched position > w/c. OT applied personal cream on RUQ skin fold due to irritation per pt request. Donned deo and shirt set up A.   Transported dependently in w/c <> gym for energy conservation. Total A needed for board placement, then CGA transfer on board > EOM with stability of board and w/c provided however verbal cues for BLE placement.   From elevated mat and with visual feedback from long mirror, pt was able to stand with bari RW x2 with min A both times! 2nd person present for safety however did not use. Pt able to maintain stance for about 45 sec  each trial with min A for balance, with cues for erect posture, upright gaze, however without BLE spasm. Pt able to march in place 2 times each stand without LOB however Lt knee instability noted but pt able to recover without increased assist.  Total A board placement, CGA transfer on board > w/c. Back in room, pt remained seated in w/c, with chair alarm on/activated, and with all needs in reach at end of session.  Session 2 Skilled OT intervention completed with focus on BUE endurance. Pt received seated in w/c, agreeable to session. Soreness reported in bilateral shoulder; declined meds. OT offered rest breaks, repositioning throughout for pain reduction.  Pt declined self-care needs. Requested to go outside for therapy. Transported dependently in w/c <> outside courtyard.   Seated in w/c, pt completed the following BUE exercises to promote BUE strength/endurance needed for independence with functional transfers and BADLs: (With red theraband) 2x15 reps Horizontal abduction Self-anchored bicep flexion each arm Self-anchored tricep extension each arm Alternating chest presses  Back in room, pt remained seated in w/c, with belt alarm on/activated, and with all needs in reach at end of session.   Therapy Documentation Precautions:  Precautions Precautions: Fall, Back Precaution Comments: spinal Restrictions Weight Bearing Restrictions: No    Therapy/Group: Individual Therapy  Melvyn Novas, MS, OTR/L  04/06/2023, 3:53 PM

## 2023-04-07 DIAGNOSIS — M4714 Other spondylosis with myelopathy, thoracic region: Secondary | ICD-10-CM | POA: Diagnosis not present

## 2023-04-07 LAB — BASIC METABOLIC PANEL
Anion gap: 8 (ref 5–15)
BUN: 20 mg/dL (ref 6–20)
CO2: 24 mmol/L (ref 22–32)
Calcium: 9.5 mg/dL (ref 8.9–10.3)
Chloride: 103 mmol/L (ref 98–111)
Creatinine, Ser: 1.71 mg/dL — ABNORMAL HIGH (ref 0.61–1.24)
GFR, Estimated: 46 mL/min — ABNORMAL LOW (ref >60)
Glucose, Bld: 103 mg/dL — ABNORMAL HIGH (ref 70–99)
Potassium: 3.8 mmol/L (ref 3.5–5.1)
Sodium: 135 mmol/L (ref 135–145)

## 2023-04-07 LAB — GLUCOSE, CAPILLARY: Glucose-Capillary: 99 mg/dL (ref 70–99)

## 2023-04-07 MED ORDER — POTASSIUM CHLORIDE CRYS ER 20 MEQ PO TBCR
20.0000 meq | EXTENDED_RELEASE_TABLET | Freq: Once | ORAL | Status: AC
  Administered 2023-04-07: 20 meq via ORAL
  Filled 2023-04-07: qty 1

## 2023-04-07 MED ORDER — METFORMIN HCL ER 500 MG PO TB24
500.0000 mg | ORAL_TABLET | Freq: Every day | ORAL | Status: DC
  Administered 2023-04-08: 500 mg via ORAL
  Filled 2023-04-07: qty 1

## 2023-04-07 NOTE — Progress Notes (Signed)
Occupational Therapy Session Note  Patient Details  Name: Raymond [name] MRN: [id number] Date of Birth: Nov 01, 1965  Today's Date: 04/07/2023 OT Individual Time: 4098-1191 OT Individual Time Calculation (min): 40 min    Short Term Goals: Week 3:  OT Short Term Goal 1 (Week 3): STG = LTG due to ELOS  Skilled Therapeutic Interventions/Progress Updates:  Skilled OT intervention completed with focus on family education with wife present regarding toilet transfers and recommendations. Pt received seated in w/c, agreeable to session. No pain reported.  OT provided education on the following in prep for DC: -Recommended slide board for all mobility with CGA/min A and min/mod A ADLs due to pt DC primarily at seated level and due to BLE weakness -Reviewed spinal precautions, especially when dual tasking during functional tasks with min cues needed intermittently for safety -Reviewed use of TEDs for edema management -Discussed AE such as long handled sponge, reacher, sock aid and toileting wand options to increase independence with LB/peri-hygiene at home with handout issued for each -Confirmed bathroom set up, with wife reporting pt will be unable to access bathroom via w/c therefore plan to use bari DABSC in room -Reviewed that DABSC can be used at bedside or from w/c <> BSC but only with assist via slide board -Discussed seated toileting however with plan to assess LB clothing management at later date  OT assisted pt in demonstrating the following during session: -total A board placement, then CGA transfer on board from w/c > bari DABSC with min cues for BLE positioning and hip/head relationship  Wife assisted with the following: -total A board placement, then CGA transfer on board from w/c > bari DABSC with min cues for BLE positioning and hip/head relationship   Pt remained seated in w/c, with chair alarm on/activated, wife present and with all needs in reach at end of session.   Therapy  Documentation Precautions:  Precautions Precautions: Fall, Back Precaution Comments: spinal Restrictions Weight Bearing Restrictions: No    Therapy/Group: Individual Therapy  Melvyn Novas, MS, OTR/L  04/07/2023, 1:49 PM

## 2023-04-07 NOTE — Progress Notes (Signed)
Patient ID: Raymond [name], male   DOB: January 11, 1966, 57 y.o.   MRN: [id number]  Outpatient referral submitted to [facility]

## 2023-04-07 NOTE — Progress Notes (Signed)
Occupational Therapy Session Note  Patient Details  Name: Raymond [name] MRN: [id number] Date of Birth: 1965/09/15  Today's Date: 04/07/2023 OT Individual Time: 1100-1115 OT Individual Time Calculation (min): 15 min   OT Co-treatment time: 1100-1130 OT Co-treatment time calculation: 30 min   Short Term Goals: Week 3:  OT Short Term Goal 1 (Week 3): STG = LTG due to ELOS  Skilled Therapeutic Interventions/Progress Updates:  Session 1 Skilled OT intervention completed with focus on custom w/c evaluation with primary PT and [employer] representative in prep for upcoming DC. Pt received seated in w/c, agreeable to session. No pain reported.  OT made the following recommendations for custom manual w/c to increase independence and with ADLs and functional mobility: -lightweight w/c to ease caregiver burden by pt's wife with loading/unloading from car -lower height for ease with propulsion using BUE -discussed how using lightweight manual w/c for at least a year, if later causes strain on RUE, pt might would qualify for power assist w/c -arm rests that flips back for slideboard transfers and for ease with lateral leaning up against surface for pressure relief  OT assisted pt from EOM > custom chair with CGA after total A board placement with cues needed for BLE positioning. Pt remained seated in w/c with PT present at direct care handoff.   Therapy Documentation Precautions:  Precautions Precautions: Fall, Back Precaution Comments: spinal Restrictions Weight Bearing Restrictions: No    Therapy/Group: Individual Therapy  Melvyn Novas, MS, OTR/L  04/07/2023, 12:28 PM

## 2023-04-07 NOTE — Progress Notes (Addendum)
Physical Therapy Session Note  Patient Details  Name: Raymond [name] MRN: [id number] Date of Birth: 07-09-1965  Today's Date: 04/07/2023 PT Individual Time: 1115-1200 PT Individual Time Calculation (min): 45 min  Co-Treatment Time: 1100-1130 Co-Treatment Time Calculation: 30 minutes   Short Term Goals: Week 1:  PT Short Term Goal 1 (Week 1): Pt will perform bed mobility with consistent MinA and abiding by spinal precautions. PT Short Term Goal 1 - Progress (Week 1): Progressing toward goal PT Short Term Goal 2 (Week 1): Pt will perform sit<>stand from elevated height with knee guard and ModA +2 PT Short Term Goal 2 - Progress (Week 1): Met PT Short Term Goal 3 (Week 1): Pt will perform slide board transfers in either direction with MinA/ CGA. PT Short Term Goal 3 - Progress (Week 1): Met PT Short Term Goal 4 (Week 1): Pt will propel w/c >25 ft with MinA. PT Short Term Goal 4 - Progress (Week 1): Met Week 2:  PT Short Term Goal 1 (Week 2): STG=LTG due to LOS  Skilled Therapeutic Interventions/Progress Updates:   Pt received seated in WC and transported to dayroom dependently for time management purposes. Pt seen for custom WC evaluation with Harrie Jeans from [employer]. Primary OT, Hope, also present for first half of evaluation. Pt's wife, [name], and grandson, Mahir, present during evaluation. Pt transferred manual WC<>EOM<>lightweight WC with CGA with assist to place/remove board and stabilize chair. Recommended ultra lightweight K5 HD (catalyst 5) to decrease caregiver burden, maximize independence, and improve pt's quality of life. Pt opted for chair without armrests to allow greater access to wheels and tension adjustable back support for comfort. Pt performed WC mobility 110ft using BUE and supervision/mod I back to room. CSW notified of pt/wife's request for hospital bed and for list of outpatient clinics near home. Concluded session with pt sitting in [facility] with all needs within reach.    Therapy Documentation Precautions:  Precautions Precautions: Fall, Back Precaution Comments: spinal Restrictions Weight Bearing Restrictions: No   Therapy/Group: Co-Treatment [name] Blima Rich PT, DPT 04/07/2023, 6:55 AM

## 2023-04-07 NOTE — Progress Notes (Signed)
Patient ID: Raymond [name], male   DOB: 06-28-1965, 57 y.o.   MRN: [id number]  Hospital bed re-ordered through [employer].

## 2023-04-07 NOTE — Plan of Care (Signed)
  Problem: RH Ambulation Goal: LTG Patient will ambulate in controlled environment (PT) Description: LTG: Patient will ambulate in a controlled environment, # of feet with assistance (PT). Outcome: Not Applicable Flowsheets (Taken 04/07/2023 0654) LTG: Pt will ambulate in controlled environ  assist needed:: (D/C) -- Note: D/C
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
## 2023-04-07 NOTE — Progress Notes (Signed)
Patient ID: Raymond [name], male   DOB: 1966/05/15, 57 y.o.   MRN: [id number]  Family education tomorrow 1-4P and Thursday 9a-12p.

## 2023-04-07 NOTE — Progress Notes (Signed)
PROGRESS NOTE   Subjective/Complaints: No new complaints this morning Feels great Had a great session with therapy Very happy with his controlled blood sugars   ROS: +impaired ambulation, +chronic back pain, +bilateral knee buckling, +stool incontinence, denies lower extremity edema, denies abdominal pain, denies shortness of breath, +decreased sensation on sole of left foot, +spasticity- improved, +GERD- improved, lethargy improved   Objective:   No results found. Recent Labs    04/06/23 0526  WBC 9.4  HGB 11.0*  HCT 33.7*  PLT 316    Recent Labs    04/06/23 0526 04/07/23 0517  NA 136 135  K 3.1* 3.8  CL 99 103  CO2 27 24  GLUCOSE 102* 103*  BUN 22* 20  CREATININE 1.79* 1.71*  CALCIUM 9.5 9.5    Intake/Output Summary (Last 24 hours) at 04/07/2023 0926 Last data filed at 04/06/2023 2230 Gross per 24 hour  Intake 240 ml  Output 450 ml  Net -210 ml        Physical Exam: Vital Signs Blood pressure (!) 162/63, pulse 72, temperature 98.7 F (37.1 C), resp. rate 17, height 5\' 11"  (1.803 m), weight (!) 141 kg, SpO2 95%. Gen: no distress, normal appearing HEENT: oral mucosa pink and moist, NCAT Cardio: RRR Pulmonary:     CTAB, normal effort Chest:     Chest wall: Tenderness (left lateral chest) present.  Abdominal:     General: Bowel sounds are normal. There is no distension.     Palpations: Abdomen is soft.     Tenderness: There is no abdominal tenderness.  Musculoskeletal:        General: Tenderness (back) present. Normal range of motion.     Cervical back: Normal range of motion.     Right lower leg: Edema 1+ present.     Left lower leg: Left lower leg edema: trace.  Skin:    General: Skin is warm and dry.     Comments: Back incision C/D/Intact. Dry scab on left shin. Stasis changes BLE.   Neurological:     Mental Status: He is alert.     Comments: Alert and oriented x 3. Normal insight and  awareness. Intact Memory. Normal language and speech. Cranial nerve exam unremarkable. MMT: UE 5/5 bilaterally. RLE 4-/5 HF, KE and 4/5 ADF/PF.  LLE 4/5 HF,KE, 4+ADF/PF.  DTR's 3+ in both LE's. 3-4 beats of clonus each foot. Mild extensor tone in LE's, big toes, knees buckling on standing attempts, decreased sensation in sole of left foot, stable 11/5 Psychiatric:        Mood and Affect: Mood normal.        Behavior: Behavior normal.        Thought Content: Thought content normal.    Assessment/Plan: 1. Functional deficits which require 3+ hours per day of interdisciplinary therapy in a comprehensive inpatient rehab setting. Physiatrist is providing close team supervision and 24 hour management of active medical problems listed below. Physiatrist and rehab team continue to assess barriers to discharge/monitor patient progress toward functional and medical goals  Care Tool:  Bathing    Body parts bathed by patient: Right arm, Left arm, Chest, Abdomen, Front perineal area, Right upper  leg, Left upper leg, Right lower leg, Left lower leg, Face   Body parts bathed by helper: Buttocks     Bathing assist Assist Level: Minimal Assistance - Patient > 75%     Upper Body Dressing/Undressing Upper body dressing   What is the patient wearing?: Pull over shirt    Upper body assist Assist Level: Set up assist    Lower Body Dressing/Undressing Lower body dressing      What is the patient wearing?: Underwear/pull up, Pants     Lower body assist Assist for lower body dressing: Maximal Assistance - Patient 25 - 49%     Toileting Toileting    Toileting assist Assist for toileting: 2 Helpers     Transfers Chair/bed transfer  Transfers assist     Chair/bed transfer assist level: Contact Guard/Touching assist (slideboard)     Locomotion Ambulation   Ambulation assist   Ambulation activity did not occur: Safety/medical concerns  Assist level:  (+3 assist) Assistive device:  Lite Gait Max distance: 7ft   Walk 10 feet activity   Assist  Walk 10 feet activity did not occur: Safety/medical concerns        Walk 50 feet activity   Assist Walk 50 feet with 2 turns activity did not occur: Safety/medical concerns         Walk 150 feet activity   Assist Walk 150 feet activity did not occur: Safety/medical concerns         Walk 10 feet on uneven surface  activity   Assist Walk 10 feet on uneven surfaces activity did not occur: Safety/medical concerns         Wheelchair     Assist Is the patient using a wheelchair?: Yes Type of Wheelchair: Manual    Wheelchair assist level: Supervision/Verbal cueing Max wheelchair distance: 145ft    Wheelchair 50 feet with 2 turns activity    Assist        Assist Level: Supervision/Verbal cueing   Wheelchair 150 feet activity     Assist      Assist Level: Supervision/Verbal cueing   Blood pressure (!) 162/63, pulse 72, temperature 98.7 F (37.1 C), resp. rate 17, height 5\' 11"  (1.803 m), weight (!) 141 kg, SpO2 95%.  Medical Problem List and Plan: 1. 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 Pt s/p T10 laminectomy and discectomy on 03/12/23.              -patient Sohail shower             -ELOS/Goals: 17-24 days, goals supervision to min assist at w/c level             -Bilateral PRAFO's  Continue CIR  Grounds pass ordered  Discussed that he would like to purchase Stedy 2.  Multiple DVTs: Eliquis started             -antiplatelet therapy: N/A 3. Pain Management: Oxycodone prn--> has been using IV dilaudid couple of times a day. Kpad ordered  Prescribed home Zynex machine.              --He feels that dilaudid works better but oxycodone last longer. Will schedule oxycodone 10 mg TID for now              --Wean down as pain/activity improves.    4. Mood/Behavior/Sleep: LCSW to follow for evaluation and support.              -antipsychotic  agents: N/A  -  Patient seen by neuropsychology  5. Neuropsych/cognition: This patient is capable of making decisions on his own behalf. 6. Skin/Wound Care: back incision is CDI. Pang be left open to air  7. Fluids/Electrolytes/Nutrition: Monitor I/O. Check CMET in am  Insomnia: continue melatonin HS  9. HTN: Monitor BS TID--continue Amlodipine,  Coreg, Cardura and Lasix bid             --labile with SBP ranging 130-160 during the day. Monitor for orthostatic symptoms.   -10/28 SBP 140-146, amlodipine was stopped by nephrology, continue current regimen    04/07/2023    5:51 AM 04/06/2023    8:00 PM 04/06/2023    2:25 PM  Vitals with BMI  Systolic 162 161 425  Diastolic 63 84 89  Pulse 72 86 93    10.  OSA: CPAP at nights-->Is compliant with his machine and advised him to ask his wife to bring it from home.  -Patient reports he got his CPAP machine from home 11. T2DM: Hgb A1C- 6.0 and well controlled.  Add CBG checks back as appetite has decreased due to GERD.  BS stable on glucotrol and metformin daily in am.  --monitor for changes with increase in activity.  -d/c glipizide. Decrease metformin to 500mg  q24H tablet CBG (last 3)  Recent Labs    04/06/23 0614 04/06/23 1633 04/07/23 0619  GLUCAP 111* 106* 99     10. COPD: Has intermittent cough " from 40 yrs of smoking"             --resume MDI. 11. Hypokalemia: Likely due to diuretic/intake. Will continue K dur  30 meq BID --K- 3.3 on 10/09 (admission)-->recheck BMET in am 12.  AKI on CKD: Baseline SCr around 1.3--creatinine reviewed and improved, discussed with patient, consulted nephrology, lasix decreased to 20mg  BID, encouraged 6-8 glasses of water daily and repeat BMP tomorrow. Advised not to use Goodys or Mobic. Amlodipine d/ced Lasix decreased to 10mg  BID given AKI and lower weights  Discussed that creatinine has improved, repeat tomorrow  13. Leucocytosis: Monitor for fevers and other signs of infection. WBC was up to 13.3  after surgery.             --recheck WBC in am 14. Acute on chronic anemia: Recheck H/H in am 15. Constipation/neurogenic bowel.   -- On Senna 1 bid--> change to 2 in am. Sorbitol 45 cc tonight. -dulcolax supp in pm Bowel program restarted  16.  Neurogenic bladder: Continue Flomax and Cardura. Foley was placed at admission             -continue foley tonight.  -Voiding trial in AM -10/28 recent continent PVRs not elevated  17. Morbid obesity: BMI -44. Was 317 at admission and down to 315 lbs. He would like to lose more weight.  --Will need ongoing education on diet as well as importance of weight loss to promote mobility and wound healing  18.  Anxiety d/o: Continue Cymbalta with prn Xanax. Magnesium added given suboptimal levels  19.  Left chest wall/rib pain: Has been ongoing since 4 days and feels that is positional. Lidocaine patches at night as pain worse then.  CXR reviewed and shows no fracture/infection  20. Hypoalbuminemia: encouraged prioritizing protein in diet  21. ESBL: completed nitrofurantoin  22. Polypharmacy: decrease lasix to 10mg  BID given decreased weight and AKI on CKD, examined lower extremities and edema remains stable -decrease cymbalta to 40mg  since he denies neuropathy, is unsure why he is on this, and it could  be contributing to AKI/constipation   23.  Knee pain bilateral: Will start Voltaren gel.  24. Muscle spasms: replace baclofen with scheduled robaxin, schedule tizanidine 2mg  daily, increase to BID  25. Wheezing: resolved, decrease lasix back to 10mg  BID given worsening creatinine  26. Decreased sensation in left foot: lumbar spinal XR ordered, reviewed and shows degenerative changes  27. GERD: Pantoprazole ordered. Advised that his wife can bring his Prilosec from home instead since this is more effective for him, placed order for meals out of bed  28. HTN: continue lasix, add klor BID for 2 doses on 11/4, add kdur on 11/5  29.  Constipation: increase colace to 100mg  TID and add senna 2 tabs HS, last BM 11/2, add miralax daily, last BM 11/5, continue this regimen  30. Hypokalemia: supplement klor BID for 2 doses on 11/4, supplement kdur on 11/5  LOS: 15 days A FACE TO FACE EVALUATION WAS PERFORMED  Cherylin Waguespack P Ladiamond Gallina 04/07/2023, 9:26 AM

## 2023-04-07 NOTE — Progress Notes (Signed)
Physical Therapy Session Note  Patient Details  Name: Raymond [name] MRN: [id number] Date of Birth: 08-15-1965  Today's Date: 04/07/2023 PT Individual Time: 0916-1000 PT Individual Time Calculation (min): 44 min   Short Term Goals: Week 2:  PT Short Term Goal 1 (Week 2): STG=LTG due to LOS  Skilled Therapeutic Interventions/Progress Updates: Pt presents sitting in w/c and agreeable to therapy.  Pt wheeled x 115' w/ supervision to dayroom, PT completed negotiation 2/2 fatigue.  Pt required mod A for placement of SB w/ transfer to R.  Pt required CGA for SB transfer, and cues for re-positioning feet, forward lean and stabilization of w/c.  Pt performed scooting side to side on mat table, cues for forward lean to improve slide.  Pt performed multiple sit to stand transfers from elevated mat table w/ A + 2 and blocking R knee.  Pt able to stand x 30 seconds and on fnal, increased cueing for slower controlled descent to seat.  Pt performed SB transfer to L w/ same A and cueing.  Pt returned to room and remained sitting in w/c w/ chair larm on and all needs in reach.     Therapy Documentation Precautions:  Precautions Precautions: Fall, Back Precaution Comments: spinal Restrictions Weight Bearing Restrictions: No General:   Vital Signs:   Pain:0/10      Therapy/Group: Individual Therapy  [name] 04/07/2023, 10:02 AM

## 2023-04-08 DIAGNOSIS — M4714 Other spondylosis with myelopathy, thoracic region: Secondary | ICD-10-CM | POA: Diagnosis not present

## 2023-04-08 LAB — BASIC METABOLIC PANEL
Anion gap: 7 (ref 5–15)
BUN: 18 mg/dL (ref 6–20)
CO2: 25 mmol/L (ref 22–32)
Calcium: 9.3 mg/dL (ref 8.9–10.3)
Chloride: 104 mmol/L (ref 98–111)
Creatinine, Ser: 1.62 mg/dL — ABNORMAL HIGH (ref 0.61–1.24)
GFR, Estimated: 49 mL/min — ABNORMAL LOW (ref >60)
Glucose, Bld: 102 mg/dL — ABNORMAL HIGH (ref 70–99)
Potassium: 3.6 mmol/L (ref 3.5–5.1)
Sodium: 136 mmol/L (ref 135–145)

## 2023-04-08 LAB — GLUCOSE, CAPILLARY: Glucose-Capillary: 102 mg/dL — ABNORMAL HIGH (ref 70–99)

## 2023-04-08 MED ORDER — POTASSIUM CHLORIDE 20 MEQ PO PACK: 40.0000 meq | PACK | Freq: Once | ORAL | Status: DC

## 2023-04-08 MED ORDER — DULOXETINE HCL 30 MG PO CPEP
30.0000 mg | ORAL_CAPSULE | Freq: Every day | ORAL | Status: DC
  Administered 2023-04-09 – 2023-04-10 (×2): 30 mg via ORAL
  Filled 2023-04-08 (×2): qty 1

## 2023-04-08 NOTE — Progress Notes (Signed)
Occupational Therapy Session Note  Patient Details  Name: Raymond [name] MRN: [id number] Date of Birth: 1965-06-18  Today's Date: 04/08/2023 OT Individual Time: 1400-1500 OT Individual Time Calculation (min): 60 min    Short Term Goals: Week 3:  OT Short Term Goal 1 (Week 3): STG = LTG due to ELOS Week 4:     Skilled Therapeutic Interventions/Progress Updates:    1:! Pt received in the w/c. Focus on family education with pt and pt's wife. Heavy focus on safe slide board transfer w/c to bariatric drop arm BSC- with focus on proper board placement; use of dycem under the board on the [facility] to maintain; setup of w/c in prep for transfer (especially since they will be using a long slide board), problem solved pros and cons of different places to set up [facility] at their home to allow for lateral leans bilaterally, clothing management (down and up) and best positioning for wife to be in for assistance (especially with pants up). Also discussed different ways to assist pt with hygiene (wife decided to slide out bucket and clean from underneath). Pt able to perform the transfer w/c<>BSC via slideboard with min A. Pt able to get down pants and then min A for pants up. He can pull up pants half way but then pushing up on arm rests to unweight his bottom for wife to pull up the rest of pants. Also discussed importance of clothing barrier for skin protection with slide board transfers. Also discussed his bowel program and how its success wil impact the amt of accidents he will have (at home it had been often).  Pt left sitting up in w/c with wife at end of session.   Therapy Documentation Precautions:  Precautions Precautions: Fall, Back Precaution Comments: spinal Restrictions Weight Bearing Restrictions: No  Pain: No reports of pain but did report nausea at the end of session - reported to RN   Therapy/Group: Individual Therapy  Roney Mans [facility] 04/08/2023, 6:55 PM

## 2023-04-08 NOTE — Progress Notes (Signed)
PROGRESS NOTE   Subjective/Complaints: Feeling very well Discussed that creatinine has improved Blood sugars are very well controlled, will d/c metformin   ROS: +impaired ambulation, +chronic back pain- stable, +bilateral knee buckling, +stool incontinence, denies lower extremity edema, denies abdominal pain, denies shortness of breath, +decreased sensation on sole of left foot, +spasticity- improved, +GERD- improved, lethargy improved   Objective:   No results found. Recent Labs    04/06/23 0526  WBC 9.4  HGB 11.0*  HCT 33.7*  PLT 316    Recent Labs    04/07/23 0517 04/08/23 0525  NA 135 136  K 3.8 3.6  CL 103 104  CO2 24 25  GLUCOSE 103* 102*  BUN 20 18  CREATININE 1.71* 1.62*  CALCIUM 9.5 9.3    Intake/Output Summary (Last 24 hours) at 04/08/2023 0952 Last data filed at 04/08/2023 0428 Gross per 24 hour  Intake 480 ml  Output 1250 ml  Net -770 ml        Physical Exam: Vital Signs Blood pressure (!) 155/86, pulse 86, temperature 98.1 F (36.7 C), temperature source Oral, resp. rate 18, height 5\' 11"  (1.803 m), weight (!) 141 kg, SpO2 96%. Gen: no distress, normal appearing HEENT: oral mucosa pink and moist, NCAT Cardio: RRR Pulmonary:     CTAB, normal effort Chest:     Chest wall: Tenderness (left lateral chest) present.  Abdominal:     General: Bowel sounds are normal. There is no distension.     Palpations: Abdomen is soft.     Tenderness: There is no abdominal tenderness.  Musculoskeletal:        General: Tenderness (back) present. Normal range of motion.     Cervical back: Normal range of motion.     Right lower leg: Edema 1+ present.     Left lower leg: Left lower leg edema: trace.  Skin:    General: Skin is warm and dry.     Comments: Back incision C/D/Intact. Dry scab on left shin. Stasis changes BLE.   Neurological:     Mental Status: He is alert.     Comments: Alert and oriented  x 3. Normal insight and awareness. Intact Memory. Normal language and speech. Cranial nerve exam unremarkable. MMT: UE 5/5 bilaterally. RLE 4-/5 HF, KE and 4/5 ADF/PF.  LLE 4/5 HF,KE, 4+ADF/PF.  DTR's 3+ in both LE's. 3-4 beats of clonus each foot. Mild extensor tone in LE's, big toes, knees buckling on standing attempts, decreased sensation in sole of left foot, stable 11/6 Psychiatric:        Mood and Affect: Mood normal.        Behavior: Behavior normal.        Thought Content: Thought content normal.    Assessment/Plan: 1. Functional deficits which require 3+ hours per day of interdisciplinary therapy in a comprehensive inpatient rehab setting. Physiatrist is providing close team supervision and 24 hour management of active medical problems listed below. Physiatrist and rehab team continue to assess barriers to discharge/monitor patient progress toward functional and medical goals  Care Tool:  Bathing    Body parts bathed by patient: Right arm, Left arm, Chest, Abdomen, Front perineal area, Right  upper leg, Left upper leg, Right lower leg, Left lower leg, Face   Body parts bathed by helper: Buttocks     Bathing assist Assist Level: Minimal Assistance - Patient > 75%     Upper Body Dressing/Undressing Upper body dressing   What is the patient wearing?: Pull over shirt    Upper body assist Assist Level: Set up assist    Lower Body Dressing/Undressing Lower body dressing      What is the patient wearing?: Underwear/pull up, Pants     Lower body assist Assist for lower body dressing: Maximal Assistance - Patient 25 - 49%     Toileting Toileting    Toileting assist Assist for toileting: 2 Helpers     Transfers Chair/bed transfer  Transfers assist     Chair/bed transfer assist level: Supervision/Verbal cueing (slideboard)     Locomotion Ambulation   Ambulation assist   Ambulation activity did not occur: Safety/medical concerns  Assist level:  (+3  assist) Assistive device: Lite Gait Max distance: 56ft   Walk 10 feet activity   Assist  Walk 10 feet activity did not occur: Safety/medical concerns        Walk 50 feet activity   Assist Walk 50 feet with 2 turns activity did not occur: Safety/medical concerns         Walk 150 feet activity   Assist Walk 150 feet activity did not occur: Safety/medical concerns         Walk 10 feet on uneven surface  activity   Assist Walk 10 feet on uneven surfaces activity did not occur: Safety/medical concerns         Wheelchair     Assist Is the patient using a wheelchair?: Yes Type of Wheelchair: Manual    Wheelchair assist level: Supervision/Verbal cueing Max wheelchair distance: 120    Wheelchair 50 feet with 2 turns activity    Assist        Assist Level: Supervision/Verbal cueing   Wheelchair 150 feet activity     Assist      Assist Level: Supervision/Verbal cueing   Blood pressure (!) 155/86, pulse 86, temperature 98.1 F (36.7 C), temperature source Oral, resp. rate 18, height 5\' 11"  (1.803 m), weight (!) 141 kg, SpO2 96%.  Medical Problem List and Plan: 1. 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 Pt s/p T10 laminectomy and discectomy on 03/12/23.              -patient [place]manu[place] shower             -ELOS/Goals: 17-24 days, goals supervision to min assist at w/c level             -Bilateral PRAFO's  Continue CIR  Grounds pass ordered  Discussed that he would like to purchase Stedy 2.  Multiple DVTs: Eliquis started             -antiplatelet therapy: N/A 3. Pain Management: Oxycodone prn--> has been using IV dilaudid couple of times a day. Kpad ordered  Prescribed home Zynex machine.              --He feels that dilaudid works better but oxycodone last longer. Will schedule oxycodone 10 mg TID for now              --Wean down as pain/activity improves.    4. Mood/Behavior/Sleep: LCSW to follow for  evaluation and support.              -  antipsychotic agents: N/A  -Patient seen by neuropsychology  5. Neuropsych/cognition: This patient is capable of making decisions on his own behalf. 6. Skin/Wound Care: back incision is CDI. [place] be left open to air  7. Fluids/Electrolytes/Nutrition: Monitor I/O. Check CMET in am  Insomnia: continue melatonin HS  9. HTN: Monitor BS TID--continue Amlodipine,  Coreg, Cardura and Lasix bid             --labile with SBP ranging 130-160 during the day. Monitor for orthostatic symptoms.   -10/28 SBP 140-146, amlodipine was stopped by nephrology, continue current regimen    04/08/2023    4:31 AM 04/07/2023    8:58 PM 04/07/2023    7:35 PM  Vitals with BMI  Systolic 155 150 440  Diastolic 86 76 76  Pulse 86  80    10.  OSA: CPAP at nights-->Is compliant with his machine and advised him to ask his wife to bring it from home.  -Patient reports he got his CPAP machine from home 11. T2DM: Hgb A1C- 6.0 and well controlled.  Add CBG checks back as appetite has decreased due to GERD.   --monitor for changes with increase in activity.  -d/c glipizide. D/c metformin CBG (last 3)  Recent Labs    04/06/23 1633 04/07/23 0619 04/08/23 0617  GLUCAP 106* 99 102*     10. COPD: Has intermittent cough " from 40 yrs of smoking"             --resume MDI. 11. Hypokalemia: Likely due to diuretic/intake. Will continue K dur  30 meq BID --K- 3.3 on 10/09 (admission)-->recheck BMET in am 12.  AKI on CKD: Baseline SCr around 1.3--creatinine reviewed and improved, discussed with patient, consulted nephrology, lasix decreased to 20mg  BID, encouraged 6-8 glasses of water daily and repeat BMP tomorrow. Advised not to use Goodys or Mobic. Amlodipine d/ced Lasix decreased to 10mg  BID given AKI and lower weights  Discussed that creatinine has improved, repeat tomorrow  13. Leucocytosis: Monitor for fevers and other signs of infection. WBC was up to 13.3 after surgery.              --recheck WBC in am 14. Acute on chronic anemia: Recheck H/H in am 15. Constipation/neurogenic bowel.   -- On Senna 1 bid--> change to 2 in am. Sorbitol 45 cc tonight. -dulcolax supp in pm Bowel program restarted  16.  Neurogenic bladder: Continue Flomax and Cardura. Foley was placed at admission             -continue foley tonight.  -Voiding trial in AM -10/28 recent continent PVRs not elevated  17. Morbid obesity: BMI -44. Was 317 at admission and down to 315 lbs. He would like to lose more weight.  --Will need ongoing education on diet as well as importance of weight loss to promote mobility and wound healing  18.  Anxiety d/o: Continue Cymbalta with prn Xanax. Magnesium added given suboptimal levels  19.  Left chest wall/rib pain: Has been ongoing since 4 days and feels that is positional. Lidocaine patches at night as pain worse then.  CXR reviewed and shows no fracture/infection  20. Hypoalbuminemia: encouraged prioritizing protein in diet  21. ESBL: completed nitrofurantoin  22. Polypharmacy: decrease lasix to 10mg  BID given decreased weight and AKI on CKD, examined lower extremities and edema remains stable -decrease cymbalta to 40mg  since he denies neuropathy, is unsure why he is on this, and it could be contributing to AKI/constipation   23.  Knee  pain bilateral: Will start Voltaren gel.  24. Muscle spasms: baclofen restarted, continue tizanidine  25. Wheezing: resolved, decrease lasix back to 10mg  BID given worsening creatinine  26. Decreased sensation in left foot: lumbar spinal XR ordered, reviewed and shows degenerative changes  27. GERD: Pantoprazole ordered. Advised that his wife can bring his Prilosec from home instead since this is more effective for him, placed order for meals out of bed  28. HTN: continue lasix, add klor BID for 2 doses on 11/4, add kdur on 11/5, continue baclofen, cardura, coreg, losartan, oxycodone  29. Constipation:  increase colace to 100mg  TID and add senna 2 tabs HS, last BM 11/2, add miralax daily, last BM 11/5, continue this regimen, decrease cymbalta to 30mg  as patient denies neuropathy/anxiety/depression  30. Hypokalemia: supplement klor BID for 2 doses on 11/4, supplement kdur on 11/5, supplement kdur 40 on 11/6  LOS: 16 days A FACE TO FACE EVALUATION WAS PERFORMED  Clint Bolder P Esmeralda Malay 04/08/2023, 9:52 AM

## 2023-04-08 NOTE — Progress Notes (Addendum)
Patient ID: Raymond [name], male   DOB: 1966/05/16, 57 y.o.   MRN: [id number]  Team Conference Report to Patient/Family  Team Conference discussion was reviewed with the patient and caregiver, including goals, any changes in plan of care and target discharge date.  Patient and caregiver express understanding and are in agreement.  The patient has a target discharge date of 04/10/23.  Sw met with patient and spouse in the room to provide team conference updates. Patient happy about his custom wheelchair. Patient on target for discharge. No additional questions or concerns.  [name] 04/08/2023, 2:46 PM

## 2023-04-08 NOTE — Patient Care Conference (Signed)
Inpatient RehabilitationTeam Conference and Plan of Care Update Date: 04/08/2023   Time: 11:37 AM    Patient Name: Raymond [name]      Medical Record Number: [id number]  Date of Birth: 1966-05-25 Sex: Male         Room/Bed: 4W03C/4W03C-01 Payor Info: Payor: [employer]IG[employer] / Plan: CIGNA MANAGED / Product Type: *No Product type* /    Admit Date/Time:  03/23/2023  7:24 PM  Primary Diagnosis:  Thoracic myelopathy  Hospital Problems: Principal Problem:   Thoracic myelopathy Active Problems:   Coping style affecting medical condition    Expected Discharge Date: Expected Discharge Date: 04/10/23  Team Members Present: Physician leading conference: Dr. Sula Soda Social Worker Present: Lavera Guise, BSW Nurse Present: Vedia Pereyra, RN PT Present: Raechel Chute, PT OT Present: Roney Mans, OT PPS Coordinator present : Fae Pippin, SLP     Current Status/Progress Goal Weekly Team Focus  Bowel/Bladder   Pt is currently continent of B/B   LBM 04/17/23   Pt will maintain continence with normal B/B pattern   Assist pt with toileting needs and provide education to prevent constipation    Swallow/Nutrition/ Hydration               ADL's   Mod I UB, Mod A LB (after assist to stand in STEDY), Mod-max A toileting but sometimes +2 pending fatigue/BLE spasms   Min A   Barriers- formal family ed, endurance, BLE spasms/pain, caregiver burden, custom DME    Mobility   rolling and supine<>sit mod I using bed features but min A for sit<>supine, slideboard transfers CGA, sit<>stands with bari RW mod A +2 with knee block (but has been able to perform with min A of 1 twice with OT), WC moiblity 168ft using BUE and supervision   Mod I/ sup for bed mobility, sup/ CGA for slide board/ squat pivot, MaxA for stand, sup to MinA for w/c mobility. Gait goals discharged  barriers: custom WC, BLE weakness with intermittent flexor tone, spinal precautions, body habitus, and family education     Communication                Safety/Cognition/ Behavioral Observations               Pain   Verbalizes pain to lower back at pain level 49f 8/10. Pain being managed with scheduled/prn pain medications   Pt will be free from pain   Assess pt for pain qshift/prn and provide education on pain medications    Skin   Incision to lower back, open to air   Skin will be free from infection  Assess for s/s of infection qshift/prn and provide education to prevent skin breakdown      Discharge Planning:  discharging home with spouse on Friday. OP   Team Discussion: Thoracic myelopathy.  Contact for ESBL in urine. Continent of bowel this week. Does spill urinal occasionally due to body habitus. Chronic pain and spasms. Incision is healed and open to air.  Kidney function improving. Blood sugars improving.  Tolerating carb-mod diet.  Daily weights.  Will get Ultralight w/c.  Patient on target to meet rehab goals: yes, meeting most goals with discharge date 04/10/23  *See Care Plan and progress notes for long and short-term goals.   Revisions to Treatment Plan:  Baclofen increased.  Checking blood sugar BID. Metformin stopped. Cymbalta decreased. Monitor labs/VS Teaching Needs: Medications, diet/lifestyle modifications, safety, self care, transfer training, skin care, etc.   Current Barriers to Discharge:  Decreased caregiver support, Incontinence, and Weight  Possible Resolutions to Barriers: Family education today Adhere to bowel/bladder regimen to continue continence Adhere to appropriate diet/lifestyle modifications to improve weight and overall health Order recommended DME      Medical Summary Current Status: bowel continence, morbid obesity, spasticity, AKI, pain, spinal incision, type 2 diabetes with hyperglycemia  Barriers to Discharge: Medical stability  Barriers to Discharge Comments: bowel continence, morbid obesity, spasticity, AKI, pain, spinal incision, type 2  diabetes with hyperglycemia Possible Resolutions to [employer]: continue bowel regimen, decrease cymblata to 30mg , provided dietary education, continue baclofen, conitnue to monitor creatinine and CBGs, continue to monitor incision, continue prn pain medications   Continued Need for Acute Rehabilitation Level of Care: The patient requires daily medical management by a physician with specialized training in physical medicine and rehabilitation for the following reasons: Direction of a multidisciplinary physical rehabilitation program to maximize functional independence : Yes Medical management of patient stability for increased activity during participation in an intensive rehabilitation regime.: Yes Analysis of laboratory values and/or radiology reports with any subsequent need for medication adjustment and/or medical intervention. : Yes   I attest that I was present, lead the team conference, and concur with the assessment and plan of the team.   Jearld Adjutant 04/08/2023, 1:15 PM
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
## 2023-04-09 ENCOUNTER — Ambulatory Visit: Payer: Managed Care, Other (non HMO) | Admitting: Dietician

## 2023-04-09 DIAGNOSIS — M4714 Other spondylosis with myelopathy, thoracic region: Secondary | ICD-10-CM | POA: Diagnosis not present

## 2023-04-09 LAB — BASIC METABOLIC PANEL
Anion gap: 10 (ref 5–15)
BUN: 21 mg/dL — ABNORMAL HIGH (ref 6–20)
CO2: 25 mmol/L (ref 22–32)
Calcium: 9.6 mg/dL (ref 8.9–10.3)
Chloride: 102 mmol/L (ref 98–111)
Creatinine, Ser: 1.53 mg/dL — ABNORMAL HIGH (ref 0.61–1.24)
GFR, Estimated: 52 mL/min — ABNORMAL LOW (ref >60)
Glucose, Bld: 103 mg/dL — ABNORMAL HIGH (ref 70–99)
Potassium: 3.7 mmol/L (ref 3.5–5.1)
Sodium: 137 mmol/L (ref 135–145)

## 2023-04-09 LAB — GLUCOSE, CAPILLARY
Glucose-Capillary: 108 mg/dL — ABNORMAL HIGH (ref 70–99)
Glucose-Capillary: 106 mg/dL — ABNORMAL HIGH (ref 70–99)
Glucose-Capillary: 107 mg/dL — ABNORMAL HIGH (ref 70–99)

## 2023-04-09 MED ORDER — SENNA 8.6 MG PO TABS
2.0000 | ORAL_TABLET | Freq: Every day | ORAL | 0 refills | Status: DC
  Filled 2023-04-09: qty 120, 60d supply, fill #0

## 2023-04-09 MED ORDER — DICLOFENAC SODIUM 1 % EX GEL
4.0000 g | Freq: Four times a day (QID) | CUTANEOUS | 0 refills | Status: DC
  Filled 2023-04-09: qty 400, 25d supply, fill #0

## 2023-04-09 MED ORDER — DULOXETINE HCL 30 MG PO CPEP
30.0000 mg | ORAL_CAPSULE | Freq: Every day | ORAL | 0 refills | Status: DC
  Filled 2023-04-09: qty 30, 30d supply, fill #0

## 2023-04-09 MED ORDER — BACLOFEN 10 MG PO TABS
10.0000 mg | ORAL_TABLET | Freq: Four times a day (QID) | ORAL | 0 refills | Status: DC
  Filled 2023-04-09: qty 120, 30d supply, fill #0

## 2023-04-09 MED ORDER — PANTOPRAZOLE SODIUM 40 MG PO TBEC
80.0000 mg | DELAYED_RELEASE_TABLET | Freq: Every day | ORAL | 0 refills | Status: DC
  Filled 2023-04-09: qty 60, 30d supply, fill #0

## 2023-04-09 MED ORDER — FUROSEMIDE 20 MG PO TABS
10.0000 mg | ORAL_TABLET | Freq: Two times a day (BID) | ORAL | 0 refills | Status: DC
  Filled 2023-04-09: qty 30, 30d supply, fill #0

## 2023-04-09 MED ORDER — MELATONIN 5 MG PO TABS
5.0000 mg | ORAL_TABLET | Freq: Every evening | ORAL | 0 refills | Status: DC | PRN
  Filled 2023-04-09: qty 30, 30d supply, fill #0

## 2023-04-09 MED ORDER — ACETAMINOPHEN 325 MG PO TABS: 325.0000 mg | ORAL_TABLET | ORAL | Status: DC | PRN

## 2023-04-09 MED ORDER — CAMPHOR-MENTHOL 0.5-0.5 % EX LOTN
TOPICAL_LOTION | CUTANEOUS | 0 refills | Status: DC | PRN
  Filled 2023-04-09: qty 222, fill #0

## 2023-04-09 MED ORDER — TIZANIDINE HCL 2 MG PO TABS
2.0000 mg | ORAL_TABLET | Freq: Every day | ORAL | 0 refills | Status: DC
  Filled 2023-04-09: qty 30, 30d supply, fill #0

## 2023-04-09 MED ORDER — APIXABAN 5 MG PO TABS
5.0000 mg | ORAL_TABLET | Freq: Two times a day (BID) | ORAL | 0 refills | Status: DC
  Filled 2023-04-09: qty 60, 30d supply, fill #0

## 2023-04-09 MED ORDER — SENNOSIDES-DOCUSATE SODIUM 8.6-50 MG PO TABS
2.0000 | ORAL_TABLET | Freq: Every day | ORAL | 0 refills | Status: DC
  Filled 2023-04-09: qty 60, 30d supply, fill #0

## 2023-04-09 MED ORDER — LIDOCAINE 5 % EX PTCH
2.0000 | MEDICATED_PATCH | Freq: Every day | CUTANEOUS | 0 refills | Status: DC
  Filled 2023-04-09: qty 60, 30d supply, fill #0

## 2023-04-09 MED ORDER — ATORVASTATIN CALCIUM 40 MG PO TABS
40.0000 mg | ORAL_TABLET | Freq: Every evening | ORAL | 0 refills | Status: DC
  Filled 2023-04-09: qty 30, 30d supply, fill #0

## 2023-04-09 MED ORDER — OXYCODONE HCL 5 MG PO TABS
5.0000 mg | ORAL_TABLET | ORAL | 0 refills | Status: DC | PRN
  Filled 2023-04-09: qty 60, 7d supply, fill #0

## 2023-04-09 MED ORDER — DOCUSATE SODIUM 100 MG PO CAPS
100.0000 mg | ORAL_CAPSULE | Freq: Two times a day (BID) | ORAL | 0 refills | Status: DC
  Filled 2023-04-09: qty 60, 30d supply, fill #0

## 2023-04-09 MED ORDER — TAMSULOSIN HCL 0.4 MG PO CAPS
0.4000 mg | ORAL_CAPSULE | Freq: Every day | ORAL | 0 refills | Status: DC
  Filled 2023-04-09: qty 30, 30d supply, fill #0

## 2023-04-09 MED ORDER — POTASSIUM CHLORIDE CRYS ER 20 MEQ PO TBCR
40.0000 meq | EXTENDED_RELEASE_TABLET | Freq: Once | ORAL | Status: AC
  Administered 2023-04-09: 40 meq via ORAL
  Filled 2023-04-09: qty 2

## 2023-04-09 MED ORDER — POLYETHYLENE GLYCOL 3350 17 GM/SCOOP PO POWD
17.0000 g | Freq: Every day | ORAL | 0 refills | Status: DC
  Filled 2023-04-09: qty 238, 14d supply, fill #0

## 2023-04-09 MED ORDER — OXYCODONE HCL 5 MG PO TABS
5.0000 mg | ORAL_TABLET | Freq: Three times a day (TID) | ORAL | Status: DC
  Administered 2023-04-09 – 2023-04-10 (×2): 5 mg via ORAL
  Filled 2023-04-09 (×2): qty 1

## 2023-04-09 MED ORDER — DOXAZOSIN MESYLATE 2 MG PO TABS
2.0000 mg | ORAL_TABLET | Freq: Every day | ORAL | 0 refills | Status: DC
  Filled 2023-04-09: qty 30, 30d supply, fill #0

## 2023-04-09 MED ORDER — ATORVASTATIN CALCIUM 40 MG PO TABS: 40.0000 mg | ORAL_TABLET | Freq: Every day | ORAL | Status: DC

## 2023-04-09 MED ORDER — LOSARTAN POTASSIUM 25 MG PO TABS
75.0000 mg | ORAL_TABLET | Freq: Every day | ORAL | 0 refills | Status: DC
  Filled 2023-04-09: qty 90, 30d supply, fill #0

## 2023-04-09 MED ORDER — CARVEDILOL 25 MG PO TABS
25.0000 mg | ORAL_TABLET | Freq: Two times a day (BID) | ORAL | 0 refills | Status: DC
  Filled 2023-04-09: qty 60, 30d supply, fill #0

## 2023-04-09 NOTE — Progress Notes (Signed)
Occupational Therapy Session Note  Patient Details  Name: Cordarro Spinnato Dorough MRN: [id number] Date of Birth: [birth year]  Today's Date: 04/09/2023 OT Individual Time: 0905-1000 & 2536-6440 OT Individual Time Calculation (min): 55 min & 40 min   Short Term Goals: Week 3:  OT Short Term Goal 1 (Week 3): STG = LTG due to ELOS  Skilled Therapeutic Interventions/Progress Updates:  Session 1 Skilled OT intervention completed with focus on ADL retraining, functional endurance, and mobility within a shower context. Pt received upright in bed with wife present, agreeable to session. No pain reported.  Pt transitioned to EOB with mod I. Able to doff shirt mod I. Sit > stand in STEDY with min A +2 (with wife assisting) then dependent transfer with mod A of 1 from perched position in STEDY with min A+2 to lower to rolling shower chair. Dependent transfer > shower.  Pt was able to bathe all parts with supervision, at the seated level using long handled sponge to access BLE and periareas as applicable through hole of the shower chair. Wife assisted with thoroughness of periarea and back.   Able to donn gold bond on skin folds with min A, deo/shirt with set up A. Pt with report of need for BM. OT placed bucket underneath rolling shower chair with pt able to have small smear BM with time but unable to empty. Nurse notified of pt request for enema/suppository. Mod A +2 in STEDY then dependent transfer > EOB, with min A +2 to stand from perched position and to lower to EOB. Transitioned supine with mod I. Pt remained supine in bed, with bed alarm on/activated, and with all needs in reach at end of session.  Session 2 Skilled OT intervention completed with focus on functional transfers, w/c mobility, BUE strengthening. Pt received upright in bed, agreeable to session. No pain reported.  Pt transitioned to EOB with mod I using bed rail and HOB elevated. Total A board placement, then supervision transfer on board > w/c  with cues needed for BLE positioning intermittently. Pharmacy present for med list in prep for DC.  Transported dependently in w/c to outside courtyard, then pt practiced self-propulsion with mod I on pavement about 100 ft, with discussion on w/c mobility around his home that has paved paths and ramp.   Seated in w/c, pt completed the following BUE exercises to promote BUE strength/endurance needed for independence with functional transfers and BADLs: (With 5 lb dumbbell) -15 chest press, theraband used only on RUE due to shoulder weakness -2x15 bicep curls  Back in room, pt remained seated in w/c, with chair alarm on/activated, and with all needs in reach at end of session.   Therapy Documentation Precautions:  Precautions Precautions: Fall, Back Precaution Comments: spinal, intermittent spasticity in BLEs Restrictions Weight Bearing Restrictions: No    Therapy/Group: Individual Therapy  Deloras Reichard E Deontra Pereyra, MS, OTR/L  04/09/2023, 1:50 PM

## 2023-04-09 NOTE — Plan of Care (Signed)
  Problem: RH Tub/Shower Transfers Goal: LTG Patient will perform tub/shower transfers w/assist (OT) Description: LTG: Patient will perform tub/shower transfers with assist, with/without cues using equipment (OT) Outcome: Not Applicable Flowsheets (Taken 04/09/2023 0753) LTG: Pt will perform tub/shower stall transfers with assistance level of: (Goal discontinued due to inaccessible bathroom at home via w/c therefore no longer appropriate) --

## 2023-04-09 NOTE — Progress Notes (Signed)
Physical Therapy Discharge Summary  Patient Details  Name: Raymond [name] MRN: [id number] Date of Birth: 13-Nov-1965  Date of Discharge from PT service:April 09, 2023  Patient has met 5 of 7 long term goals due to improved activity tolerance, improved balance, improved postural control, increased strength, ability to compensate for deficits, improved attention, improved awareness, and improved coordination. Patient to discharge at a wheelchair level Supervision.  Patient's care partner is independent to provide the necessary physical and cognitive assistance at discharge.  Reasons goals not met: pt did not meet dynamic standing balance goal of mod A as pt only able to maintain static standing balance with mod A. Pt did not meet bed mobility goal of mod I as pt currently requires min A for BLE management when transitioning sit<>supine. Goals not met due to global weakness/deconditioning, body habitus limiting ROM, and decreased standing tolerance.   Recommendation:  Patient will benefit from ongoing skilled PT services in outpatient setting to continue to advance safe functional mobility, address ongoing impairments in transfers, generalized strengthening and endurance, standing tolerance/balance, safety, and to minimize fall risk.  Equipment: Hospital bed and lightweight K5 manual WC  Reasons for discharge: treatment goals met and discharge from hospital  Patient/family agrees with progress made and goals achieved: Yes  PT Discharge Precautions/Restrictions Precautions Precautions: Fall;Back Precaution Comments: spinal, intermittent spasticity in BLEs Restrictions Weight Bearing Restrictions: No Pain Interference Pain Interference Pain Effect on Sleep: 2. Occasionally Pain Interference with Therapy Activities: 1. Rarely or not at all Pain Interference with Day-to-Day Activities: 1. Rarely or not at all Cognition Overall Cognitive Status: Within Functional Limits for tasks  assessed Arousal/Alertness: Awake/alert Orientation Level: Oriented X4 Memory: Impaired Awareness: Impaired Problem Solving: Impaired Safety/Judgment: Impaired Comments: decreased insight into deficits Sensation Sensation Light Touch: Impaired Detail Light Touch Impaired Details: Impaired RLE Hot/Cold: Not tested Proprioception: Impaired by gross assessment Stereognosis: Not tested Additional Comments: pt reports bilateral tingling along entire aspect of both legs. Absent sensation along bilateral medial malleoli and bilateral great toes Coordination Gross Motor Movements are Fluid and Coordinated: No Fine Motor Movements are Fluid and Coordinated: Yes Coordination and Movement Description: decreased standing balance strategies, global weakness/deconditioning, and spasticity and intermittent flexor tone in hamstrings Finger Nose Finger Test: [facility] bilaterally Heel Shin Test: decreased ROM and weakness bilaterally L>R Motor  Motor Motor: Clonus;Abnormal tone;Abnormal postural alignment and control Motor - Skilled Clinical Observations: decreased standing balance strategies, global weakness/deconditioning, and spasticity and intermittent flexor tone in hamstrings  Mobility Bed Mobility Bed Mobility: Rolling Right;Rolling Left;Sit to Supine;Supine to Sit Rolling Right: Independent with assistive device Rolling Left: Independent with assistive device Supine to Sit: Independent with assistive device Sit to Supine: Minimal Assistance - Patient > 75% Transfers Transfers: Sit to Stand;Stand to Sit;Lateral/Scoot Transfers Sit to Stand: Moderate Assistance - Patient 50-74% Stand to Sit: Minimal Assistance - Patient > 75% Stand Pivot Transfers: Dependent - mechanical lift Lateral/Scoot Transfers: Supervision/Verbal cueing (slideboard) Transfer (Assistive device):  (slideboard) Transfer via Lift Equipment: Youth worker Ambulation: No Gait Gait: No Stairs / Additional  Locomotion Stairs: No Corporate treasurer: Yes Wheelchair Assistance: Independent with Scientist, research (life sciences): Both upper extremities Wheelchair Parts Management: Supervision/cueing Distance: 131ft  Trunk/Postural Assessment  Cervical Assessment Cervical Assessment: Exceptions to [facility] (forward head) Thoracic Assessment Thoracic Assessment: Exceptions to [facility] (mild kyphosis) Lumbar Assessment Lumbar Assessment: Exceptions to [facility] (posterior pelvic tilt with lordosis) Postural Control Postural Control: Deficits on evaluation Protective Responses: delayed  Balance Balance Balance Assessed: Yes  Static Sitting Balance Static Sitting - Balance Support: Feet supported;Bilateral upper extremity supported Static Sitting - Level of Assistance: 7: Independent Dynamic Sitting Balance Dynamic Sitting - Balance Support: Feet supported;No upper extremity supported Dynamic Sitting - Level of Assistance: 6: Modified independent (Device/Increase time) Static Standing Balance Static Standing - Balance Support: Bilateral upper extremity supported (bari RW) Static Standing - Level of Assistance: 3: Mod assist Extremity Assessment  RLE Assessment RLE Assessment: Exceptions to [facility] General Strength Comments: tested sitting in [facility] RLE Strength Right Hip Flexion: 3+/5 Right Hip ABduction: 4/5 Right Hip ADduction: 4/5 Right Knee Flexion: 4/5 Right Knee Extension: 4/5 Right Ankle Dorsiflexion: 4/5 Right Ankle Plantar Flexion: 4/5 LLE Assessment LLE Assessment: Exceptions to [facility] General Strength Comments: tested sitting in WC LLE Strength Left Hip Flexion: 3+/5 Left Hip ABduction: 4+/5 Left Hip ADduction: 4+/5 Left Knee Flexion: 4/5 Left Knee Extension: 4/5 Left Ankle Dorsiflexion: 4+/5 Left Ankle Plantar Flexion: 4+/5   [name] Blima Rich PT, DPT 04/09/2023, 7:14 AM

## 2023-04-09 NOTE — Progress Notes (Signed)
PROGRESS NOTE   Subjective/Complaints: Wife at bedside has questions about his clots, cymbalta Also discussed his creatinine, blood sugars, medication changes   ROS: +impaired ambulation- continues, +chronic back pain- stable, +bilateral knee buckling, +stool incontinence, denies lower extremity edema, denies abdominal pain, denies shortness of breath, +decreased sensation on sole of left foot, +spasticity- improved, +GERD- improved, lethargy improved   Objective:   No results found. No results for input(s): "WBC", "HGB", "HCT", "PLT" in the last 72 hours.   Recent Labs    04/08/23 0525 04/09/23 0625  NA 136 137  K 3.6 3.7  CL 104 102  CO2 25 25  GLUCOSE 102* 103*  BUN 18 21*  CREATININE 1.62* 1.53*  CALCIUM 9.3 9.6    Intake/Output Summary (Last 24 hours) at 04/09/2023 1027 Last data filed at 04/09/2023 0800 Gross per 24 hour  Intake 880 ml  Output 1050 ml  Net -170 ml        Physical Exam: Vital Signs Blood pressure (!) 146/81, pulse 71, temperature 98 F (36.7 C), resp. rate 18, height 5\' 11"  (1.803 m), weight (!) 141.9 kg, SpO2 96%. Gen: no distress, normal appearing HEENT: oral mucosa pink and moist, NCAT Cardio: RRR Pulmonary:     CTAB, normal effort Chest:     Chest wall: Tenderness (left lateral chest) present.  Abdominal:     General: Bowel sounds are normal. There is no distension.     Palpations: Abdomen is soft.     Tenderness: There is no abdominal tenderness.  Musculoskeletal:        General: Tenderness (back) present. Normal range of motion.     Cervical back: Normal range of motion.     Right lower leg: Edema 1+ present.     Left lower leg: Left lower leg edema: trace.  Skin:    General: Skin is warm and dry.     Comments: Back incision C/D/Intact. Dry scab on left shin. Stasis changes BLE.   Neurological:     Mental Status: He is alert.     Comments: Alert and oriented x 3. Normal  insight and awareness. Intact Memory. Normal language and speech. Cranial nerve exam unremarkable. MMT: UE 5/5 bilaterally. RLE 4-/5 HF, KE and 4/5 ADF/PF.  LLE 4/5 HF,KE, 4+ADF/PF.  DTR's 3+ in both LE's. 3-4 beats of clonus each foot. Mild extensor tone in LE's, big toes, knees buckling on standing attempts, decreased sensation in sole of left foot, stable 11/7 Psychiatric:        Mood and Affect: Mood normal.        Behavior: Behavior normal.        Thought Content: Thought content normal.    Assessment/Plan: 1. Functional deficits which require 3+ hours per day of interdisciplinary therapy in a comprehensive inpatient rehab setting. Physiatrist is providing close team supervision and 24 hour management of active medical problems listed below. Physiatrist and rehab team continue to assess barriers to discharge/monitor patient progress toward functional and medical goals  Care Tool:  Bathing    Body parts bathed by patient: Right arm, Left arm, Chest, Abdomen, Front perineal area, Right upper leg, Left upper leg, Right lower leg, Left  lower leg, Face   Body parts bathed by helper: Buttocks     Bathing assist Assist Level: Minimal Assistance - Patient > 75%     Upper Body Dressing/Undressing Upper body dressing   What is the patient wearing?: Pull over shirt    Upper body assist Assist Level: Set up assist    Lower Body Dressing/Undressing Lower body dressing      What is the patient wearing?: Underwear/pull up, Pants     Lower body assist Assist for lower body dressing: Maximal Assistance - Patient 25 - 49%     Toileting Toileting    Toileting assist Assist for toileting: 2 Helpers     Transfers Chair/bed transfer  Transfers assist     Chair/bed transfer assist level: Supervision/Verbal cueing (slideboard)     Locomotion Ambulation   Ambulation assist   Ambulation activity did not occur: Safety/medical concerns  Assist level:  (+3 assist) Assistive  device: Lite Gait Max distance: 50ft   Walk 10 feet activity   Assist  Walk 10 feet activity did not occur: Safety/medical concerns        Walk 50 feet activity   Assist Walk 50 feet with 2 turns activity did not occur: Safety/medical concerns         Walk 150 feet activity   Assist Walk 150 feet activity did not occur: Safety/medical concerns         Walk 10 feet on uneven surface  activity   Assist Walk 10 feet on uneven surfaces activity did not occur: Safety/medical concerns         Wheelchair     Assist Is the patient using a wheelchair?: Yes Type of Wheelchair: Manual    Wheelchair assist level: Supervision/Verbal cueing Max wheelchair distance: 120    Wheelchair 50 feet with 2 turns activity    Assist        Assist Level: Supervision/Verbal cueing   Wheelchair 150 feet activity     Assist      Assist Level: Supervision/Verbal cueing   Blood pressure (!) 146/81, pulse 71, temperature 98 F (36.7 C), resp. rate 18, height 5\' 11"  (1.803 m), weight (!) 141.9 kg, SpO2 96%.  Medical Problem List and Plan: 1. 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 Pt s/p T10 laminectomy and discectomy on 03/12/23.              -patient [name] shower             -ELOS/Goals: 17-24 days, goals supervision to min assist at w/c level             -Bilateral PRAFO's  Continue CIR  Grounds pass ordered  Discussed that he would like to purchase Stedy 2.  Multiple DVTs: Eliquis started             -antiplatelet therapy: N/A 3. Pain Management: Oxycodone prn--> has been using IV dilaudid couple of times a day. Kpad ordered  Prescribed home Zynex machine.              --He feels that dilaudid works better but oxycodone last longer. Will schedule oxycodone 10 mg TID for now              --Wean down as pain/activity improves.    4. Mood/Behavior/Sleep: LCSW to follow for evaluation and support.               -antipsychotic agents: N/A  -Patient seen by neuropsychology  5. Neuropsych/cognition: This  patient is capable of making decisions on his own behalf. 6. Skin/Wound Care: back incision is CDI. Woods be left open to air  7. Fluids/Electrolytes/Nutrition: Monitor I/O. Check CMET in am  Insomnia: continue melatonin HS  9. HTN: Monitor BS TID--continue Amlodipine,  Coreg, Cardura and Lasix bid             --labile with SBP ranging 130-160 during the day. Monitor for orthostatic symptoms.   -10/28 SBP 140-146, amlodipine was stopped by nephrology, continue current regimen    04/09/2023    8:45 AM 04/09/2023    4:41 AM 04/08/2023    8:11 PM  Vitals with BMI  Weight  312 lbs 13 oz   BMI  43.65   Systolic 146   146 158 146  Diastolic 81   81 82 77  Pulse 71   79 83     10.  OSA: CPAP at nights-->Is compliant with his machine and advised him to ask his wife to bring it from home.  -Patient reports he got his CPAP machine from home 11. T2DM: Hgb A1C- 6.0 and well controlled.  Add CBG checks back as appetite has decreased due to GERD.   --monitor for changes with increase in activity.  -d/c glipizide. D/c metformin, discussed excellent blood sugar control CBG (last 3)  Recent Labs    04/07/23 0619 04/08/23 0617 04/09/23 0600  GLUCAP 99 102* 108*     10. COPD: Has intermittent cough " from 40 yrs of smoking"             --resume MDI. 11. Hypokalemia: Likely due to diuretic/intake. Will continue K dur  30 meq BID --K- 3.3 on 10/09 (admission)-->recheck BMET in am 12.  AKI on CKD: Baseline SCr around 1.3--creatinine reviewed and improved, discussed with patient, consulted nephrology, lasix decreased to 20mg  BID, encouraged 6-8 glasses of water daily and repeat BMP tomorrow. Advised not to use Goodys or Mobic. Amlodipine d/ced Lasix decreased to 10mg  BID given AKI and lower weights  Discussed that creatinine has improved, repeat tomorrow  13. Leucocytosis: Monitor for fevers and other  signs of infection. WBC was up to 13.3 after surgery.             --recheck WBC in am 14. Acute on chronic anemia: Recheck H/H in am 15. Constipation/neurogenic bowel.   -- On Senna 1 bid--> change to 2 in am. Sorbitol 45 cc tonight. -dulcolax supp in pm Bowel program restarted  16.  Neurogenic bladder: Continue Flomax and Cardura. Foley was placed at admission             -continue foley tonight.  -Voiding trial in AM -10/28 recent continent PVRs not elevated  17. Morbid obesity: BMI -44. Was 317 at admission and down to 315 lbs. He would like to lose more weight.  --Will need ongoing education on diet as well as importance of weight loss to promote mobility and wound healing  18.  Anxiety d/o: Continue Cymbalta with prn Xanax. Magnesium added given suboptimal levels  19.  Left chest wall/rib pain: Has been ongoing since 4 days and feels that is positional. Lidocaine patches at night as pain worse then.  CXR reviewed and shows no fracture/infection  20. Hypoalbuminemia: encouraged prioritizing protein in diet  21. ESBL: completed nitrofurantoin  22. Polypharmacy: decrease lasix to 10mg  BID given decreased weight and AKI on CKD, examined lower extremities and edema remains stable -decrease cymbalta to 40mg  since he denies neuropathy, is unsure why he  is on this, and it could be contributing to AKI/constipation   23.  Knee pain bilateral: Will start Voltaren gel.  24. Muscle spasms: baclofen restarted, continue tizanidine  25. Wheezing: resolved, decrease lasix back to 10mg  BID given worsening creatinine  26. Decreased sensation in left foot: lumbar spinal XR ordered, reviewed and shows degenerative changes  27. GERD: Pantoprazole ordered. Advised that his wife can bring his Prilosec from home instead since this is more effective for him, placed order for meals out of bed  28. HTN: continue lasix, optimize potassium to goal of 4, continue baclofen, cardura, coreg, losartan,  oxycodone, BP reviewed and has improved  29. Constipation: increase colace to 100mg  TID and add senna 2 tabs HS, last BM 11/5, add miralax daily, last BM 11/5, continue this regimen, decrease cymbalta to 30mg  as patient denies neuropathy/anxiety/depression, discussed that can consider restarting metformin if becomes constipated again.   30. Hypokalemia: supplement klor BID for 2 doses on 11/4, supplement kdur on 11/5, supplement kdur 40 on 11/6, supplement 40kdur on 11/7  31. Decreased EF: discussed with patient and wife, weight reviewed and stable.   LOS: 17 days A FACE TO FACE EVALUATION WAS PERFORMED  Raymond [name] 04/09/2023, 10:27 AM

## 2023-04-09 NOTE — Progress Notes (Signed)
Physical Therapy Session Note  Patient Details  Name: Standley Bargo Demps MRN: [id number] Date of Birth: [birth year]  Today's Date: 04/09/2023 PT Individual Time: 0160-1093 and 1415-1457 PT Individual Time Calculation (min): 58 min and 42 min  Short Term Goals: Week 1:  PT Short Term Goal 1 (Week 1): Pt will perform bed mobility with consistent MinA and abiding by spinal precautions. PT Short Term Goal 1 - Progress (Week 1): Progressing toward goal PT Short Term Goal 2 (Week 1): Pt will perform sit<>stand from elevated height with knee guard and ModA +2 PT Short Term Goal 2 - Progress (Week 1): Met PT Short Term Goal 3 (Week 1): Pt will perform slide board transfers in either direction with MinA/ CGA. PT Short Term Goal 3 - Progress (Week 1): Met PT Short Term Goal 4 (Week 1): Pt will propel w/c >25 ft with MinA. PT Short Term Goal 4 - Progress (Week 1): Met Week 2:  PT Short Term Goal 1 (Week 2): STG=LTG due to LOS  Skilled Therapeutic Interventions/Progress Updates:   Treatment Session 1 Received pt semi-reclined in bed with [name] at bedside, waiting on suppository - RN notified and waiting on order from MD. Pt agreeable to PT treatment and going to fall festival while waiting on suppository order and reported "mild" pain in low back (premedicated). Session with emphasis on functional mobility/transfers, dressing, generalized strengthening and endurance, and WC mobility. [name] assisted with donning underwear/pants and pt rolled L/R mod I using bedrails for [name] to pull underwear/pants over hips.   Donned ted in supine with total A for edema management and pt transferred semi-reclined<>sitting L EOB with HOB slightly elevated and use of bedrails mod I. [name] assisted with donning shoes and pt performed slideboard transfer bed<>WC with close supervision with assist from [name] to [employer] and min A to manage LEs.  Pt transported to/from room in [facility] dependently for time management  purposes. Pt participated in pumpkin ring toss, plinko, and painting during Fall Festival with emphasis on engaging in social and community settings, fine motor control, coordination, core strength and reaching outside BOS. Pt smiling and engaging and appreciative of activities. Pt left in dayroom with wife and recreational therapist. [name] cleared to assist pt with transfers and safety plan updated.   Treatment Session 2 Received pt sitting in WC, pt agreeable to PT treatment, and did not state pain level during session. Session with emphasis on generalized strengthening and endurance and WC mobility. Pt performed WC mobility 165ft using BUE and mod I to dayroom with emphasis on UE strength. Pt participated dance group focused on UE/LE strength, coordination, sequencing, and social interaction in community setting with caution to adhere to back precautions. Pt able to appropriately identify dance moves that he cannot do without cues but did require intermittent reminders to take rest breaks and avoid holding breath. Pt actively singing, laughing, and socializing with other patients and requested to stay and participate further when session ended. Pt left in care of recreational therapist.  Therapy Documentation Precautions:  Precautions Precautions: Fall, Back Precaution Comments: spinal Restrictions Weight Bearing Restrictions: No  Therapy/Group: Individual Therapy [name] Blima Rich PT, DPT 04/09/2023, 6:51 AM

## 2023-04-09 NOTE — Progress Notes (Addendum)
Occupational Therapy Discharge Summary  Patient Details  Name: Field Staniszewski Lora MRN: [id number] Date of Birth: 1966/02/01  Date of Discharge from OT service:April 09, 2023   Patient has met 8 of 9 long term goals due to improved activity tolerance, improved balance, postural control, ability to compensate for deficits, functional use of  RIGHT lower and LEFT lower extremity, improved awareness, and improved coordination.  Patient to discharge at [facility] Assist level.  Patient's care partner is independent to provide the necessary physical and cognitive assistance at discharge. Pt's wife completed several hands on training/education sessions prior to DC regarding functional transfer and ADL recommendations and verbalized their readiness to assist pt at their CLOF.   Reasons goals not met: Pt did not meet toileting goal at supervision level due to increased assist needed to maintain spinal precautions as well as due to body habitus, deconditioning and environmental limitations  Recommendation:  Patient will benefit from ongoing skilled OT services in outpatient setting to continue to advance functional skills in the area of BADL, iADL, Vocation, and Reduce care partner burden.  Equipment: No equipment provided  Reasons for discharge: treatment goals met  Patient/family agrees with progress made and goals achieved: Yes  OT Discharge Precautions/Restrictions  Precautions Precautions: Fall;Back Precaution Comments: spinal, intermittent spasticity in BLEs Restrictions Weight Bearing Restrictions: No ADL ADL Equipment Provided: Long-handled sponge Eating: Independent Where Assessed-Eating: Wheelchair Grooming: Modified independent Where Assessed-Grooming: Sitting at sink Upper Body Bathing: Modified independent Where Assessed-Upper Body Bathing: Shower Lower Body Bathing: Minimal assistance Where Assessed-Lower Body Bathing: Shower Upper Body Dressing: Modified independent  (Device) Where Assessed-Upper Body Dressing: Wheelchair Lower Body Dressing: Minimal assistance Where Assessed-Lower Body Dressing: Edge of bed Toileting: Moderate assistance Where Assessed-Toileting: Bedside Commode Toilet Transfer: Minimal assistance Toilet Transfer Method: Scientist, research (life sciences): Extra wide drop arm bedside commode Tub/Shower Transfer: Not assessed Tub/Shower Transfer Method: Unable to assess Film/video editor: Dependent Film/video editor Method: Other (comment) (STEDY) Astronomer: Other (comment) (roll in shower chair) ADL Comments: pt unable to access shower at home at w/c level until ambulatory therefore tub transfer not assessed Vision Baseline Vision/History: 0 No visual deficits Patient Visual Report: No change from baseline Vision Assessment?: No apparent visual deficits Perception  Perception: Within Functional Limits Praxis Praxis: WFL Cognition Cognition Overall Cognitive Status: Within Functional Limits for tasks assessed Arousal/Alertness: Awake/alert Orientation Level: Person;Place;Situation Person: Oriented Place: Oriented Situation: Oriented Memory: Impaired Awareness: Impaired Problem Solving: Impaired Safety/Judgment: Impaired Comments: decreased insight into deficits Brief Interview for Mental Status (BIMS) Repetition of Three Words (First Attempt): 3 Temporal Orientation: Year: Correct Temporal Orientation: Month: Accurate within 5 days Temporal Orientation: Day: Correct Recall: "Sock": Yes, no cue required Recall: "Blue": Yes, no cue required Recall: "Bed": Yes, no cue required BIMS Summary Score: 15 Sensation Sensation Light Touch: Impaired Detail Light Touch Impaired Details: Impaired RLE Hot/Cold: Not tested Proprioception: Impaired by gross assessment Stereognosis: Not tested Additional Comments: pt reports bilateral tingling along entire aspect of both legs. Absent sensation  along bilateral medial malleoli and bilateral great toes Coordination Gross Motor Movements are Fluid and Coordinated: No Fine Motor Movements are Fluid and Coordinated: Yes Coordination and Movement Description: decreased standing balance strategies, global weakness/deconditioning, and spasticity and intermittent flexor tone in hamstrings Finger Nose Finger Test: [facility] bilaterally Heel Shin Test: decreased ROM and weakness bilaterally L>R Motor  Motor Motor: Clonus;Abnormal tone;Abnormal postural alignment and control Motor - Skilled Clinical Observations: decreased standing balance strategies, global weakness/deconditioning, and spasticity and  intermittent flexor tone in hamstrings Mobility  Bed Mobility Bed Mobility: Rolling Right;Rolling Left;Sit to Supine;Supine to Sit Rolling Right: Independent with assistive device Rolling Left: Independent with assistive device Supine to Sit: Independent with assistive device Sit to Supine: Minimal Assistance - Patient > 75% Transfers Sit to Stand: Moderate Assistance - Patient 50-74% Stand to Sit: Minimal Assistance - Patient > 75%  Trunk/Postural Assessment  Cervical Assessment Cervical Assessment: Exceptions to [facility] (forward head) Thoracic Assessment Thoracic Assessment: Exceptions to [facility] (mild kyphosis) Lumbar Assessment Lumbar Assessment: Exceptions to [facility] (posterior pelvic tilt with lordosis) Postural Control Postural Control: Deficits on evaluation Protective Responses: delayed  Balance Balance Balance Assessed: Yes Static Sitting Balance Static Sitting - Balance Support: Feet supported;Bilateral upper extremity supported Static Sitting - Level of Assistance: 7: Independent Dynamic Sitting Balance Dynamic Sitting - Balance Support: Feet supported;No upper extremity supported Dynamic Sitting - Level of Assistance: 6: Modified independent (Device/Increase time) Static Standing Balance Static Standing - Balance Support: Bilateral  upper extremity supported (bari RW) Static Standing - Level of Assistance: 3: Mod assist Extremity/Trunk Assessment RUE Assessment RUE Assessment: Within Functional Limits LUE Assessment LUE Assessment: Within Functional Limits   Stanislaw Acton E Jedrek Dinovo, MS, OTR/L  04/09/2023, 3:49 PM

## 2023-04-09 NOTE — Plan of Care (Signed)
  Problem: Consults Goal: RH SPINAL CORD INJURY PATIENT EDUCATION Description:  See Patient Education module for education specifics.  Outcome: Progressing   Problem: SCI BOWEL ELIMINATION Goal: RH STG MANAGE BOWEL WITH ASSISTANCE Description: STG Manage Bowel with min/mod Assistance. Outcome: Progressing Goal: RH STG SCI MANAGE BOWEL WITH MEDICATION WITH ASSISTANCE Description: STG SCI Manage bowel with medication with min assistance. Outcome: Progressing Goal: RH STG SCI MANAGE BOWEL PROGRAM W/ASSIST OR AS APPROPRIATE Description: STG SCI Manage bowel program w/min/mod assist or as appropriate. Outcome: Progressing   Problem: SCI BLADDER ELIMINATION Goal: RH STG MANAGE BLADDER WITH ASSISTANCE Description: STG Manage Bladder With min Assistance Outcome: Progressing Goal: RH STG SCI MANAGE BLADDER PROGRAM W/ASSISTANCE Description: Patient will be able to manage bladder with min assist  Outcome: Progressing   Problem: RH SKIN INTEGRITY Goal: RH STG SKIN FREE OF INFECTION/BREAKDOWN Description: Skin will remain free of infection/breakdown with min assist  Outcome: Progressing Goal: RH STG MAINTAIN SKIN INTEGRITY WITH ASSISTANCE Description: STG Maintain Skin Integrity With min Assistance. Outcome: Progressing Goal: RH STG ABLE TO PERFORM INCISION/WOUND CARE W/ASSISTANCE Description: STG Able To Perform Incision/Wound Care With min Assistance. Outcome: Progressing   Problem: RH SAFETY Goal: RH STG ADHERE TO SAFETY PRECAUTIONS W/ASSISTANCE/DEVICE Description: STG Adhere to Safety Precautions With cueing Assistance/Device. Outcome: Progressing Goal: RH STG DECREASED RISK OF FALL WITH ASSISTANCE Description: STG Decreased Risk of Fall With min Assistance. Outcome: Progressing   Problem: RH PAIN MANAGEMENT Goal: RH STG PAIN MANAGED AT OR BELOW PT'S PAIN GOAL Description: Pain will be less than 4 with PRN medications min assist  Outcome: Progressing   Problem: RH KNOWLEDGE  DEFICIT SCI Goal: RH STG INCREASE KNOWLEDGE OF SELF CARE AFTER SCI Description: Patient/caregiver will be able to manage medications, skin care, and bowel/bladder from nursing education, nursing handouts, and other resources independently  Outcome: Progressing

## 2023-04-09 NOTE — Plan of Care (Signed)
  Problem: RH Toileting Goal: LTG Patient will perform toileting task (3/3 steps) with assistance level (OT) Description: LTG: Patient will perform toileting task (3/3 steps) with assistance level (OT)  Outcome: Not Met (add Reason) Flowsheets (Taken 04/09/2023 1549) LTG: Pt will perform toileting task (3/3 steps) with assistance level: (not met due to spinal precautions, and deficits with endurance, balance and strength) --   Problem: RH Balance Goal: LTG: Patient will maintain dynamic sitting balance (OT) Description: LTG:  Patient will maintain dynamic sitting balance with assistance during activities of daily living (OT) Outcome: Completed/Met Goal: LTG Patient will maintain dynamic standing with ADLs (OT) Description: LTG:  Patient will maintain dynamic standing balance with assist during activities of daily living (OT)  Outcome: Completed/Met   Problem: Sit to Stand Goal: LTG:  Patient will perform sit to stand in prep for activites of daily living with assistance level (OT) Description: LTG:  Patient will perform sit to stand in prep for activites of daily living with assistance level (OT) Outcome: Completed/Met   Problem: RH Grooming Goal: LTG Patient will perform grooming w/assist,cues/equip (OT) Description: LTG: Patient will perform grooming with assist, with/without cues using equipment (OT) Outcome: Completed/Met   Problem: RH Bathing Goal: LTG Patient will bathe all body parts with assist levels (OT) Description: LTG: Patient will bathe all body parts with assist levels (OT) Outcome: Completed/Met   Problem: RH Dressing Goal: LTG Patient will perform upper body dressing (OT) Description: LTG Patient will perform upper body dressing with assist, with/without cues (OT). Outcome: Completed/Met Goal: LTG Patient will perform lower body dressing w/assist (OT) Description: LTG: Patient will perform lower body dressing with assist, with/without cues in positioning using  equipment (OT) Outcome: Completed/Met   Problem: RH Toilet Transfers Goal: LTG Patient will perform toilet transfers w/assist (OT) Description: LTG: Patient will perform toilet transfers with assist, with/without cues using equipment (OT) Outcome: Completed/Met

## 2023-04-10 ENCOUNTER — Other Ambulatory Visit (HOSPITAL_COMMUNITY): Payer: Self-pay

## 2023-04-10 DIAGNOSIS — M4714 Other spondylosis with myelopathy, thoracic region: Secondary | ICD-10-CM | POA: Diagnosis not present

## 2023-04-10 LAB — BASIC METABOLIC PANEL
Anion gap: 13 (ref 5–15)
BUN: 23 mg/dL — ABNORMAL HIGH (ref 6–20)
CO2: 23 mmol/L (ref 22–32)
Calcium: 9.1 mg/dL (ref 8.9–10.3)
Chloride: 101 mmol/L (ref 98–111)
Creatinine, Ser: 1.82 mg/dL — ABNORMAL HIGH (ref 0.61–1.24)
GFR, Estimated: 42 mL/min — ABNORMAL LOW (ref >60)
Glucose, Bld: 111 mg/dL — ABNORMAL HIGH (ref 70–99)
Potassium: 4.2 mmol/L (ref 3.5–5.1)
Sodium: 137 mmol/L (ref 135–145)

## 2023-04-10 LAB — GLUCOSE, CAPILLARY: Glucose-Capillary: 99 mg/dL (ref 70–99)

## 2023-04-10 MED ORDER — BACLOFEN 10 MG PO TABS: 10.0000 mg | ORAL_TABLET | Freq: Three times a day (TID) | ORAL | Status: DC

## 2023-04-10 MED ORDER — LOSARTAN POTASSIUM 50 MG PO TABS
50.0000 mg | ORAL_TABLET | Freq: Every day | ORAL | 0 refills | Status: DC
  Filled 2023-04-10: qty 30, 30d supply, fill #0

## 2023-04-10 MED ORDER — FUROSEMIDE 20 MG PO TABS: 20.0000 mg | ORAL_TABLET | Freq: Two times a day (BID) | ORAL | 0 refills | Status: DC

## 2023-04-10 MED ORDER — BACLOFEN 10 MG PO TABS
10.0000 mg | ORAL_TABLET | Freq: Three times a day (TID) | ORAL | 0 refills | Status: DC
  Filled 2023-04-10: qty 90, 30d supply, fill #0

## 2023-04-10 MED ORDER — FUROSEMIDE 20 MG PO TABS: 20.0000 mg | ORAL_TABLET | Freq: Two times a day (BID) | ORAL | Status: DC

## 2023-04-10 MED ORDER — LOSARTAN POTASSIUM 50 MG PO TABS
50.0000 mg | ORAL_TABLET | Freq: Every day | ORAL | Status: DC
Start: 2023-04-11 — End: 2023-04-10

## 2023-04-10 NOTE — Progress Notes (Signed)
PROGRESS NOTE   Subjective/Complaints: C/o mildly increased swelling in b/l lower extremities this morning, discussed increasing lasix to 20mg  BID Muscle spasms are well controlled   ROS: +impaired ambulation- continues +chronic back pain- stable, +bilateral knee buckling, +stool incontinence, denies lower extremity edema, denies abdominal pain, denies shortness of breath, +decreased sensation on sole of left foot, +spasticity- improved, +GERD- improved, lethargy improved   Objective:   No results found. No results for input(s): "WBC", "HGB", "HCT", "PLT" in the last 72 hours.   Recent Labs    04/09/23 0625 04/10/23 0502  NA 137 137  K 3.7 4.2  CL 102 101  CO2 25 23  GLUCOSE 103* 111*  BUN 21* 23*  CREATININE 1.53* 1.82*  CALCIUM 9.6 9.1    Intake/Output Summary (Last 24 hours) at 04/10/2023 0946 Last data filed at 04/10/2023 0800 Gross per 24 hour  Intake 948 ml  Output 1450 ml  Net -502 ml        Physical Exam: Vital Signs Blood pressure (!) 143/77, pulse 65, temperature 98 F (36.7 C), resp. rate 18, height 5\' 11"  (1.803 m), weight (!) 143.4 kg, SpO2 100%. Gen: no distress, normal appearing HEENT: oral mucosa pink and moist, NCAT Cardio: RRR Pulmonary:     CTAB, normal effort Chest:     Chest wall: Tenderness (left lateral chest) present.  Abdominal:     General: Bowel sounds are normal. There is no distension.     Palpations: Abdomen is soft.     Tenderness: There is no abdominal tenderness.  Musculoskeletal:        General: Tenderness (back) present. Normal range of motion.     Cervical back: Normal range of motion.     Right lower leg: Edema 1+ present.     Left lower leg: Left lower leg edema: trace.  Skin:    General: Skin is warm and dry.     Comments: Back incision C/D/Intact. Dry scab on left shin. Stasis changes BLE.   Neurological:     Mental Status: He is alert.     Comments: Alert  and oriented x 3. Normal insight and awareness. Intact Memory. Normal language and speech. Cranial nerve exam unremarkable. MMT: UE 5/5 bilaterally. RLE 4-/5 HF, KE and 4/5 ADF/PF.  LLE 4/5 HF,KE, 4+ADF/PF.  DTR's 3+ in both LE's. 3-4 beats of clonus each foot. Mild extensor tone in LE's, big toes, knees buckling on standing attempts, decreased sensation in sole of left foot, stable 11/8 Psychiatric:        Mood and Affect: Mood normal.        Behavior: Behavior normal.        Thought Content: Thought content normal.    Assessment/Plan: 1. Functional deficits which require 3+ hours per day of interdisciplinary therapy in a comprehensive inpatient rehab setting. Physiatrist is providing close team supervision and 24 hour management of active medical problems listed below. Physiatrist and rehab team continue to assess barriers to discharge/monitor patient progress toward functional and medical goals  Care Tool:  Bathing    Body parts bathed by patient: Right arm, Left arm, Chest, Abdomen, Front perineal area, Right upper leg, Left upper leg,  Right lower leg, Left lower leg, Face, Buttocks   Body parts bathed by helper: Buttocks     Bathing assist Assist Level: Supervision/Verbal cueing     Upper Body Dressing/Undressing Upper body dressing   What is the patient wearing?: Pull over shirt    Upper body assist Assist Level: Independent with assistive device    Lower Body Dressing/Undressing Lower body dressing      What is the patient wearing?: Underwear/pull up, Pants     Lower body assist Assist for lower body dressing: Minimal Assistance - Patient > 75%     Toileting Toileting    Toileting assist Assist for toileting: Minimal Assistance - Patient > 75%     Transfers Chair/bed transfer  Transfers assist     Chair/bed transfer assist level: Supervision/Verbal cueing (slideboard)     Locomotion Ambulation   Ambulation assist   Ambulation activity did not occur:  Safety/medical concerns  Assist level:  (+3 assist) Assistive device: Lite Gait Max distance: 34ft   Walk 10 feet activity   Assist  Walk 10 feet activity did not occur: Safety/medical concerns (global weakness/deconditioning, decreased standing balance)        Walk 50 feet activity   Assist Walk 50 feet with 2 turns activity did not occur: Safety/medical concerns (global weakness/deconditioning, decreased standing balance)         Walk 150 feet activity   Assist Walk 150 feet activity did not occur: Safety/medical concerns (global weakness/deconditioning, decreased standing balance)         Walk 10 feet on uneven surface  activity   Assist Walk 10 feet on uneven surfaces activity did not occur: Safety/medical concerns (global weakness/deconditioning, decreased standing balance)         Wheelchair     Assist Is the patient using a wheelchair?: Yes Type of Wheelchair: Manual    Wheelchair assist level: Independent Max wheelchair distance: 154ft    Wheelchair 50 feet with 2 turns activity    Assist        Assist Level: Independent   Wheelchair 150 feet activity     Assist      Assist Level: Independent   Blood pressure (!) 143/77, pulse 65, temperature 98 F (36.7 C), resp. rate 18, height 5\' 11"  (1.803 m), weight (!) 143.4 kg, SpO2 100%.  Medical Problem List and Plan: 1. 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 Pt s/p T10 laminectomy and discectomy on 03/12/23.              -patient [place] shower             -ELOS/Goals: 17-24 days, goals supervision to min assist at w/c level             -Bilateral PRAFO's  Continue CIR  Grounds pass ordered  Discussed that he would like to purchase Stedy 2.  Multiple DVTs: Eliquis started             -antiplatelet therapy: N/A 3. Pain Management: Oxycodone prn--> has been using IV dilaudid couple of times a day. Kpad ordered  Prescribed home Zynex machine.               --He feels that dilaudid works better but oxycodone last longer. Will schedule oxycodone 10 mg TID for now              --Wean down as pain/activity improves.    4. Mood/Behavior/Sleep: LCSW to follow for evaluation and support.              -  antipsychotic agents: N/A  -Patient seen by neuropsychology  5. Neuropsych/cognition: This patient is capable of making decisions on his own behalf. 6. Skin/Wound Care: back incision is CDI. Metzger be left open to air  7. Fluids/Electrolytes/Nutrition: Monitor I/O. Check CMET in am  Insomnia: continue melatonin HS  9. HTN: Monitor BS TID--continue Amlodipine,  Coreg, Cardura and Lasix bid             --labile with SBP ranging 130-160 during the day. Monitor for orthostatic symptoms.   -10/28 SBP 140-146, amlodipine was stopped by nephrology, continue current regimen    04/10/2023    2:59 AM 04/10/2023    2:53 AM 04/09/2023    9:19 PM  Vitals with BMI  Weight  316 lbs 2 oz   BMI  44.11   Systolic 143  142  Diastolic 77  76  Pulse 65      10.  OSA: CPAP at nights-->Is compliant with his machine and advised him to ask his wife to bring it from home.  -Patient reports he got his CPAP machine from home 11. T2DM: Hgb A1C- 6.0 and well controlled.  Add CBG checks back as appetite has decreased due to GERD.   --monitor for changes with increase in activity.  -d/c glipizide. D/c metformin, discussed excellent blood sugar control CBG (last 3)  Recent Labs    04/09/23 1145 04/09/23 1634 04/10/23 0548  GLUCAP 106* 107* 99     10. COPD: Has intermittent cough " from 40 yrs of smoking"             --resume MDI. 11. Hypokalemia: Likely due to diuretic/intake. Will continue K dur  30 meq BID --K- 3.3 on 10/09 (admission)-->recheck BMET in am 12.  AKI on CKD: Baseline SCr around 1.3--creatinine reviewed and improved, discussed with patient, consulted nephrology, encouraged 6-8 glasses of water daily and repeat BMP tomorrow. Advised not to  use Goodys or Mobic. Amlodipine d/ced Increase lasix to 20mg  BID given worsening swelling, f/u with PCP for creatinine check in 1 week, decreased baclofen to 10mg  TID.   13. Leucocytosis: Monitor for fevers and other signs of infection. WBC was up to 13.3 after surgery.             --recheck WBC in am 14. Acute on chronic anemia: Recheck H/H in am 15. Constipation/neurogenic bowel.   -- On Senna 1 bid--> change to 2 in am. Sorbitol 45 cc tonight. -dulcolax supp in pm Bowel program restarted  16.  Neurogenic bladder: Continue Flomax and Cardura. Foley was placed at admission             -continue foley tonight.  -Voiding trial in AM -10/28 recent continent PVRs not elevated  17. Morbid obesity: BMI -44. Was 317 at admission and down to 315 lbs. He would like to lose more weight.  --Will need ongoing education on diet as well as importance of weight loss to promote mobility and wound healing  18.  Anxiety d/o: Continue Cymbalta with prn Xanax. Magnesium added given suboptimal levels  19.  Left chest wall/rib pain: Has been ongoing since 4 days and feels that is positional. Lidocaine patches at night as pain worse then.  CXR reviewed and shows no fracture/infection  20. Hypoalbuminemia: encouraged prioritizing protein in diet  21. ESBL: completed nitrofurantoin  22. Polypharmacy: decrease lasix to 10mg  BID given decreased weight and AKI on CKD, examined lower extremities and edema remains stable -decrease cymbalta to 40mg  since he denies neuropathy, is  unsure why he is on this, and it could be contributing to AKI/constipation   23.  Knee pain bilateral: Will start Voltaren gel.  24. Muscle spasms: baclofen restarted, continue tizanidine  25. Wheezing: resolved, lasix increased back to 20mg  BID given leg swelling  26. Decreased sensation in left foot: lumbar spinal XR ordered, reviewed and shows degenerative changes  27. GERD: Pantoprazole ordered. Advised that his wife can bring  his Prilosec from home instead since this is more effective for him, placed order for meals out of bed  28. HTN: continue lasix, optimize potassium to goal of 4, continue baclofen, cardura, coreg, losartan, oxycodone, BP reviewed and has improved. Decrease Cozaar to 50mg  given good control, increase in lasix, and impaired kidney function  29. Constipation: increase colace to 100mg  TID and add senna 2 tabs HS, last BM 11/5, add miralax daily, last BM 11/8, continue this regimen, decrease cymbalta to 30mg  as patient denies neuropathy/anxiety/depression, discussed that can consider restarting metformin if becomes constipated again.   30. Hypokalemia: supplement klor BID for 2 doses on 11/4, supplement kdur on 11/5, supplement kdur 40 on 11/6, supplement 40kdur on 11/7, normalized 11/8  31. Decreased EF: discussed with patient and wife, weight reviewed and stable. Increase lasix to 20mg  BID given mildly increased leg swelling   >30 minutes spent in discharge of patient including review of medications and follow-up appointments, physical examination, and in answering all patient's questions   LOS: 18 days A FACE TO FACE EVALUATION WAS PERFORMED  Clint Bolder P [place] 04/10/2023, 9:46 AM

## 2023-04-10 NOTE — Progress Notes (Signed)
Inpatient Rehabilitation Discharge Medication Review by a Pharmacist  A complete drug regimen review was completed for this patient to identify any potential clinically significant medication issues.  High Risk Drug Classes Is patient taking? Indication by Medication  Antipsychotic No   Anticoagulant Yes Apixaban - DVT  Antibiotic No   Opioid Yes Oxycodone - pain   Antiplatelet No   Hypoglycemics/insulin Yes Mounjaro - DM  Vasoactive Medication Yes Carvedilol - HF Cardura - HTN Flomax - BPH Lasix - HF Losartan - HF  Chemotherapy No   Other Yes Allopurinol - gout Atorvastatin - HLD Baclofen/zanflex - muscle spasm Duloxetine - MDD Kcl - supplementation Lidoderm - pain Pantoprazole - GERD Mirapex - RLS Xanax - anxiety Albuterol - SOA Melatonin - sleep Tylenol - Pain Sarna - itching Diclofenac gel - pain     Type of Medication Issue Identified Description of Issue Recommendation(s)  Drug Interaction(s) (clinically significant)     Duplicate Therapy     Allergy     No Medication Administration End Date     Incorrect Dose     Additional Drug Therapy Needed     Significant med changes from prior encounter (inform family/care partners about these prior to discharge). no longer taking glipizide or metformin per primary team, amlodipine stopped by nephrology 10/28 Monitor glucose and blood pressure outpatient for need to add or adjust medications as appropriate  Other       Clinically significant medication issues were identified that warrant physician communication and completion of prescribed/recommended actions by midnight of the next day:  No  Name of provider notified for urgent issues identified:  Provider Method of Notification: Secure Chat    Pharmacist comments: no longer taking glipizide or metformin per primary team, amlodipine stopped by nephrology 10/28  Time spent performing this drug regimen review (minutes):  30   Ruben Im, PharmD Clinical  Pharmacist 04/10/2023 10:15 AM Please check AMION for all [facility] numbers

## 2023-04-10 NOTE — Progress Notes (Signed)
Patient ID: Raymond [name], male   DOB: Nov 05, 1965, 57 y.o.   MRN: [id number]  SW met with patient to discuss discharge questions or concerns. Patient expressed spouse will be present for discharge today. No additional questions or concerns.  Son informed of co-payment needed for shower chair. SPC to be delivered to the room.

## 2023-04-10 NOTE — Discharge Summary (Signed)
Physician Discharge Summary  Patient ID: Raymond [name] MRN: [id number] DOB/AGE: 59-Jul-1967 59 y.o.  Admit date: 03/23/2023 Discharge date: 04/10/2023  Discharge Diagnoses:  Principal Problem:   Thoracic myelopathy Active Problems:   Edema of extremities   Benign essential HTN   BPH associated with nocturia   OSA on CPAP   Moderate COPD (chronic obstructive pulmonary disease) (HCC)   Type 2 diabetes mellitus, without long-term current use of insulin (HCC)   Coping style affecting medical condition   Acute on chronic anemia   Acute kidney injury superimposed on chronic kidney disease (HCC)   GERD (gastroesophageal reflux disease)   Constipation   Chronic back pain   DVT, bilateral lower limbs (HCC)   Discharged Condition: stable  Significant Diagnostic Studies: DG Abd 1 View  Result Date: 04/02/2023 CLINICAL DATA:  Emesis. EXAM: ABDOMEN - 1 VIEW COMPARISON:  None Available. FINDINGS: The bowel gas pattern is normal. No radio-opaque calculi or other significant radiographic abnormality are seen. IMPRESSION: No abnormal bowel dilatation. Electronically Signed   By: Lupita Raider M.D.   On: 04/02/2023 12:46   DG Lumbar Spine 2-3 Views  Result Date: 03/31/2023 CLINICAL DATA:  Decreased sensation of foot EXAM: LUMBAR SPINE - 2-3 VIEW COMPARISON:  03/12/2023 FINDINGS: Frontal and lateral views of the lumbar spine are obtained. There are 5 non-rib-bearing lumbar type vertebral bodies in normal alignment. No acute fractures. Mild spondylosis at the thoracolumbar junction. There is mild spondylosis and moderate facet hypertrophy from L3-4 through L5-S1. Sacroiliac joints are normal. IMPRESSION: 1. Degenerative changes at the thoracolumbar and lumbosacral junctions. 2. No acute bony abnormality. Electronically Signed   By: Sharlet Salina M.D.   On: 03/31/2023 17:32   US RENAL  Result Date: 03/26/2023 CLINICAL DATA:  Acute kidney insufficiency EXAM: RENAL / URINARY TRACT ULTRASOUND  COMPLETE COMPARISON:  None Available. FINDINGS: Right Kidney: Renal measurements: 10.4 x 6.0 x 5.5 cm = volume: 186.6 mL. No collecting system dilatation. There is however perinephric fluid. Left Kidney: Renal measurements: 12.1 x 6.1 x 5.3 cm = volume: 203.3 mL. No collecting system dilatation. Perinephric fluid identified. Bladder: Mildly distended. Other: None. IMPRESSION: No collecting system dilatation. Bilateral nonspecific perinephric fluid. Electronically Signed   By: Karen Kays M.D.   On: 03/26/2023 19:01   VAS [place] LOWER EXTREMITY VENOUS (DVT)  Result Date: 03/25/2023  Lower Venous DVT Study Patient Name:  Raymond [name] [place]  Date of Exam:   03/25/2023 Medical Rec #: [id number]    Accession #:    [id number] Date of Birth: 1965-06-24     Patient Gender: M Patient Age:   57 years Exam Location:  [facility] Procedure:      VAS [place] LOWER EXTREMITY VENOUS (DVT) Referring Phys: Jedediah Noda --------------------------------------------------------------------------------  Indications: Immobility.  Risk Factors: None identified. Anticoagulation: Lovenox. Limitations: Poor ultrasound/tissue interface, body habitus and patient positioning. Comparison Study: No prior studies. Performing Technologist: Chanda Busing RVT  Examination Guidelines: A complete evaluation includes B-mode imaging, spectral Doppler, color Doppler, and power Doppler as needed of all accessible portions of each vessel. Bilateral testing is considered an integral part of a complete examination. Limited examinations for reoccurring indications Russell be performed as noted. The reflux portion of the exam is performed with the patient in reverse Trendelenburg.  +---------+---------------+---------+-----------+----------+--------------+ RIGHT    CompressibilityPhasicitySpontaneityPropertiesThrombus Aging +---------+---------------+---------+-----------+----------+--------------+ CFV      Full           Yes      Yes                                  +---------+---------------+---------+-----------+----------+--------------+  SFJ      Full                                                        +---------+---------------+---------+-----------+----------+--------------+ FV Prox  Full                                                        +---------+---------------+---------+-----------+----------+--------------+ FV Mid   Full                                                        +---------+---------------+---------+-----------+----------+--------------+ FV DistalFull                                                        +---------+---------------+---------+-----------+----------+--------------+ PFV      Full                                                        +---------+---------------+---------+-----------+----------+--------------+ POP      Full           Yes      Yes                                 +---------+---------------+---------+-----------+----------+--------------+ PTV      Full                                                        +---------+---------------+---------+-----------+----------+--------------+ PERO     None                                         Acute          +---------+---------------+---------+-----------+----------+--------------+ Gastroc  Partial                                      Acute          +---------+---------------+---------+-----------+----------+--------------+   +---------+---------------+---------+-----------+----------+--------------+ LEFT     CompressibilityPhasicitySpontaneityPropertiesThrombus Aging +---------+---------------+---------+-----------+----------+--------------+ CFV      Full           Yes      Yes                                 +---------+---------------+---------+-----------+----------+--------------+  SFJ      Full                                                         +---------+---------------+---------+-----------+----------+--------------+ FV Prox  Full                                                        +---------+---------------+---------+-----------+----------+--------------+ FV Mid   Full                                                        +---------+---------------+---------+-----------+----------+--------------+ FV DistalFull           Yes      Yes                                 +---------+---------------+---------+-----------+----------+--------------+ PFV      Full                                                        +---------+---------------+---------+-----------+----------+--------------+ POP      Partial        Yes      Yes                  Acute          +---------+---------------+---------+-----------+----------+--------------+ PTV      Full                                                        +---------+---------------+---------+-----------+----------+--------------+ PERO     Full                                                        +---------+---------------+---------+-----------+----------+--------------+     Summary: RIGHT: - Findings consistent with acute deep vein thrombosis involving the right peroneal veins. Findings consistent with acute intramuscular thrombosis involving the right gastrocnemius veins. - No cystic structure found in the popliteal fossa.  LEFT: - Findings consistent with acute deep vein thrombosis involving the left popliteal vein.  - No cystic structure found in the popliteal fossa.  *See table(s) above for measurements and observations. Electronically signed by Coral Else MD on 03/25/2023 at 6:21:42 PM.    Final    DG Chest 2 View  Result Date: 03/24/2023 CLINICAL DATA:  Left-sided chest wall pain. EXAM: CHEST - 2 VIEW COMPARISON:  07/09/2021. FINDINGS: The heart is enlarged and mediastinal contours are within normal limits. Pulmonary  vasculature is mildly distended.  No consolidation, effusion, or pneumothorax. No acute osseous abnormality. IMPRESSION: Cardiomegaly with mildly distended pulmonary vasculature. Electronically Signed   By: Thornell Sartorius M.D.   On: 03/24/2023 00:42    Labs:  Basic Metabolic Panel: Recent Labs  Lab 04/07/23 0517 04/08/23 0525 04/09/23 0625 04/10/23 0502  NA 135 136 137 137  K 3.8 3.6 3.7 4.2  CL 103 104 102 101  CO2 24 25 25 23   GLUCOSE 103* 102* 103* 111*  BUN 20 18 21* 23*  CREATININE 1.71* 1.62* 1.53* 1.82*  CALCIUM 9.5 9.3 9.6 9.1    CBC:    Latest Ref Rng & Units 04/06/2023    5:26 AM 04/02/2023    6:49 PM 03/24/2023    7:30 AM  CBC  WBC 4.0 - 10.5 K/uL 9.4  12.7  12.4   Hemoglobin 13.0 - 17.0 g/dL 06.3  01.6  01.0   Hematocrit 39.0 - 52.0 % 33.7  34.7  34.7   Platelets 150 - 400 K/uL 316  316  350      CBG: Recent Labs  Lab 04/08/23 0617 04/09/23 0600 04/09/23 1145 04/09/23 1634 04/10/23 0548  GLUCAP 102* 108* 106* 107* 99    Brief HPI:   Raymond [name] is a 58 y.o. male with history of COPD, T2DM, OSA, tobacco use, CKD 111a, morbid obesity who was admitted on 03/11/2023 with BLE weakness with decreased coordination and gait disorder as well as reports of urinary incontinence.  Foley was placed in ED.  He was found to have large HNP T10/T11 and underwent T10 laminectomy with discectomy by Dr. Franky Macho on 03/12/2023.  Postop had issues with poor pain control and was requiring IV Dilaudid as well as oxycodone.  P.o. intake was good however reported to be constipated and was not compliant with CPAP use.  Sensation bilateral feet and ankle were reported to be improving.  Therapy was working with patient who required mod assist with steady to stand and min assist with ADLs.  CIR was recommended due to functional decline.   Hospital Course: Daquavious Deluke Carles was admitted to rehab 03/23/2023 for inpatient therapies to consist of PT, ST and OT at least three hours five days a week. Past admission physiatrist, therapy  team and rehab RN have worked together to provide customized collaborative inpatient rehab.Surveillance dopplers ordered and revealed acute DVT in right peroneal vein, left popliteal vein and acute intramuscular thrombosis in right gastrocnemius veins.  He was started on Eliquis with serial CBC showing H&H to be stable.  Reactive leukocytosis has resolved and platelets are stable. his blood pressures were monitored on TID basis with close monitoring of renal status which was also noted to be worsening.  Nephrology was consulted for input on acute on chronic renal failure with rising serum creatinine to 2.3.  Foley was discontinued past admission in voiding was monitored with PVR checks.  He was noted to be voiding without difficulty on current dose of Flomax and Cardura.  Constipation has been resolved with titration of laxatives.  Dr. Signe Colt felt that blood pressures look well-controlled and recommended discontinuation of amlodipine and to continue on reduced dose of furosemide.  Renal ultrasound ordered for workup and showed mildly distended bladder without collecting system dilatation and nonspecific bilateral perinephric fluid.  He did have increasing lower extremity edema with increase in activity therefore Lasix was titrated back to 20 mg twice daily and Cozaar decreased to 50 mg to help with adequate perfusion.  He has been educated low-salt diet, elevation as well as compression to help with lower extremity edema control.  Hypokalemia has resolved with addition of K-Dur for supplementation.  He has been compliant with CPAP use during his stay. Dr. Kieth Brightly has worked with patient on coping skills and he has indicated improvement in mood and outlook.   Back pain has improved with decreasing use of oxycodone during his stay.  Back incision is clean dry intact and healing well.  Spasticity has been managed with use of baclofen as well as additional dose of Zanaflex in AM.  Baclofen dose was decreased to 10  mg TID and he is tolerating this without side effects.  Voltaren gel added for local measures and chronic bilateral knee pains.  Lumbar films done due to reports of decreased sensation left foot and x-ray showed evidence of degenerative disc disease.  His blood sugars were monitored with ac/hs checks and have been relatively controlled with resumption of home Mounjaro.  Chest x-ray was done for follow-up on left chest wall/rib pain and showed no evidence of fracture or infection.  His respiratory status has been stable and he has tolerated increase in activity.  He has made steady progress but continues to be limited by body habitus, back precautions as well as overall deconditioned state.  He requires min assist at discharge and will continue to receive follow-up outpatient PT and OT at [facility] after discharge.    Rehab course: During patient's stay in rehab weekly team conferences were held to monitor patient's progress, set goals and discuss barriers to discharge. At admission, patient required max assist with basic ADL tasks and with mobility. He  has had improvement in activity tolerance, balance, postural control as well as ability to compensate for deficits.  He has made gains during his stay but goals not met due to his overall deconditioned state as well as body habitus. He requires mod assist to maintain dynamic standing balance and min assist for BLE management when transitioning from sitting to supine.  He is able to complete ADL tasks with min assist.  He is able to perform lateral scoot transfers with supervision and verbal cues with use of sliding board.  Ambulation not tested at this time.  He is able to navigate his wheelchair 150 feet with supervision and verbal cues.  Family education has been completed with wife.   Discharge disposition: 01-Home or Self Care  Diet: Heart Healthy/Carb Modified.   Special Instructions: Elevated BLE when seated. 2.  Recommend repeat  BMET in a week to monitor renal status.  3.  Recommend Eliquis for at least 3 months from 03/26/23 to treat BLE DVT.    Allergies as of 04/10/2023       Reactions   Sulfa Antibiotics Hives        Medication List     STOP taking these medications    amLODipine 10 MG tablet Commonly known as: NORVASC   glipiZIDE 10 MG tablet Commonly known as: GLUCOTROL   [employer] Extra Strength 520-260-32.5 MG Pack Generic drug: Aspirin-Acetaminophen-Caffeine   meloxicam 15 MG tablet Commonly known as: MOBIC   metFORMIN 500 MG tablet Commonly known as: GLUCOPHAGE   metolazone 2.5 MG tablet Commonly known as: ZAROXOLYN   Milk of Magnesia 400 MG/5ML suspension Generic drug: magnesium hydroxide   naproxen sodium 220 MG tablet Commonly known as: ALEVE   nystatin-triamcinolone cream Commonly known as: MYCOLOG II   omeprazole 40 MG capsule Commonly known as: PRILOSEC  Replaced by: pantoprazole 40 MG tablet   polyethylene glycol 17 g packet Commonly known as: MIRALAX / GLYCOLAX Replaced by: polyethylene glycol powder 17 GM/SCOOP powder   Potassium Chloride ER 20 MEQ Tbcr   vitamin D3 25 MCG tablet Commonly known as: CHOLECALCIFEROL       TAKE these medications    acetaminophen 325 MG tablet Commonly known as: TYLENOL Take 1-2 tablets (325-650 mg total) by mouth every 4 (four) hours as needed for mild pain (pain score 1-3). What changed:  medication strength how much to take when to take this reasons to take this   albuterol 108 (90 Base) MCG/ACT inhaler Commonly known as: VENTOLIN HFA Inhale 2 puffs into the lungs every 6 (six) hours as needed for wheezing or shortness of breath.   allopurinol 300 MG tablet Commonly known as: ZYLOPRIM Take 300 mg by mouth daily.   ALPRAZolam 0.5 MG tablet Commonly known as: XANAX Take 0.5 mg by mouth 2 (two) times daily as needed.   atorvastatin 40 MG tablet Commonly known as: LIPITOR Take 1 tablet (40 mg total) by mouth every  evening.   baclofen 10 MG tablet Commonly known as: LIORESAL Take 1 tablet (10 mg total) by mouth 3 (three) times daily. Notes to patient: For muscle spasms--can decrease to 1/2 tablet three times a day as spasms start getting better.    camphor-menthol lotion Commonly known as: SARNA Apply topically as needed for itching.   carvedilol 25 MG tablet Commonly known as: COREG Take 1 tablet (25 mg total) by mouth in the morning and at bedtime.   diclofenac Sodium 1 % Gel Commonly known as: VOLTAREN Apply 4 g topically 4 (four) times daily. To bilateral knees   docusate sodium 100 MG capsule Commonly known as: COLACE Take 1 capsule (100 mg total) by mouth 2 (two) times daily. Notes to patient: For constipation   doxazosin 2 MG tablet Commonly known as: CARDURA Take 1 tablet (2 mg total) by mouth at bedtime.   DULoxetine 30 MG capsule Commonly known as: CYMBALTA Take 1 capsule (30 mg total) by mouth daily. What changed:  medication strength how much to take   Eliquis 5 MG Tabs tablet Generic drug: apixaban Take 1 tablet (5 mg total) by mouth 2 (two) times daily. Notes to patient: Blood thinner   furosemide 20 MG tablet Commonly known as: LASIX Take 1 tablet (20 mg total) by mouth 2 (two) times daily. What changed:  medication strength how much to take   lidocaine 5 % Commonly known as: LIDODERM Place 2 patches onto the skin daily at 10 pm. Remove & Discard patch within 12 hours or as directed by MD   losartan 50 MG tablet Commonly known as: Cozaar Take 1 tablet (50 mg total) by mouth daily. What changed: when to take this   melatonin 5 MG Tabs Take 1 tablet (5 mg total) by mouth at bedtime as needed.   Mounjaro 10 MG/0.5ML Pen Generic drug: tirzepatide Inject 10 mg into the skin once a week.   oxyCODONE 5 MG immediate release tablet--Rx # 60 pills Commonly known as: Oxy IR/ROXICODONE Take 1-2 tablets (5-10 mg total) by mouth every 4 (four) hours as needed  for severe pain (pain score 7-10).   pantoprazole 40 MG tablet Commonly known as: PROTONIX Take 2 tablets (80 mg total) by mouth daily. Replaces: omeprazole 40 MG capsule   polyethylene glycol powder 17 GM/SCOOP powder Commonly known as: GLYCOLAX/MIRALAX Take 17 g by mouth daily. Replaces:  polyethylene glycol 17 g packet   pramipexole 0.5 MG tablet Commonly known as: MIRAPEX Take 0.5 mg by mouth in the morning and at bedtime.   senna 8.6 MG Tabs tablet Commonly known as: SENOKOT Take 2 tablets (17.2 mg total) by mouth at bedtime.   Senna-S 8.6-50 MG tablet Generic drug: senna-docusate Take 2 tablets by mouth daily at 6 (six) AM. What changed:  how much to take when to take this   tamsulosin 0.4 MG Caps capsule Commonly known as: FLOMAX Take 1 capsule (0.4 mg total) by mouth at bedtime. Notes to patient: For constipation   tiZANidine 2 MG tablet Commonly known as: ZANAFLEX Take 1 tablet (2 mg total) by mouth daily. Notes to patient: For muscle spasms        Follow-up Information     Venetia Constable, MD. Call.   Specialty: Internal Medicine Why: Monday for post hospital follow up Contact information: [address] [place] [address] [phone]         Coletta Memos, MD Follow up.   Specialty: Neurosurgery Why: Call in 1-2 days for post hospital follow up Contact information: [address] [place] [address] [phone]         Horton Chin, MD Follow up.   Specialty: Physical Medicine and Rehabilitation Why: office will call you with follow up appointment Contact information: [address] [place] [address] [phone]         Bufford Buttner, MD. Call.   Specialty: Nephrology Why: next week for hospital follow up Contact information: [address] [place] [address] [phone]                 Signed: Jacquelynn Cree 04/13/2023, 5:45 PM

## 2023-04-10 NOTE — Progress Notes (Signed)
Inpatient Rehabilitation Care Coordinator Discharge Note   Patient Details  Name: Raymond [name] MRN: [id number] Date of Birth: 03/19/1966   Discharge location: Home  Length of Stay: 18 Days  Discharge activity level: Sup/Min A  Home/community participation: spouse  Patient response WU:[id number] Literacy - How often do you need to have someone help you when you read instructions, pamphlets, or other written material from your doctor or pharmacy?: Often  Patient response YN:WGNFAO Isolation - How often do you feel lonely or isolated from those around you?: Rarely  Services provided included: Neuropsych, SW, Pharmacy, TR, RN, CM, SLP, OT, PT, RD, MD  Financial Services:  Financial Services Utilized: [employer]  Choices offered to/list presented to: Patient and spouse  Follow-up services arranged:  Outpatient, DME    Outpatient Servicies: Op [facility]- PT/OT DME : Hospital Bed, Transfer Bench    Patient response to transportation need: Is the patient able to respond to transportation needs?: Yes In the past 12 months, has lack of transportation kept you from medical appointments or from getting medications?: No In the past 12 months, has lack of transportation kept you from meetings, work, or from getting things needed for daily living?: No   Patient/Family verbalized understanding of follow-up arrangements:  Yes  Individual responsible for coordination of the follow-up plan: self or spouse, [phone]  Confirmed correct DME delivered: [name] 04/10/2023    Comments (or additional information):  Summary of Stay    Date/Time Discharge Planning CSW  04/07/23 1340 discharging home with spouse on Friday. OP CJB  04/01/23 9629 Discharging home with spouse. Has BSC, RW, WC,Rollator and transfer board CJB  03/24/23 1352 Discharging home with spouse to assist, step daughter lives next dooe if requirng additional assistance. WC level since July and  SNF admission in August. Barrier: insurance. CJB       [name]

## 2023-04-10 NOTE — Discharge Summary (Signed)
Physician Discharge Summary  Patient ID: Raymond [name] MRN: [id number] DOB/AGE: [birth year] 59 y.o.  Admit date:03/12/2023 Discharge date: 03/23/2023  Admission Diagnoses:thoracic hnp with myelopathy T10/11  Discharge Diagnoses:  Principal Problem:   HNP (herniated nucleus pulposus with myelopathy), thoracic   Discharged Condition: good  Hospital Course: Raymond [name] was taken to the operating room for a T10/11 discetomy on the left. Post op his strength was slowly improving. The wound was clean and dry. He had not regained urinary continence, nor was he able to ambulate  Treatments: surgery: THORACIC 10 LAMINECTOMY AND  DISCECTOMY Microdissection  Discharge Exam: Blood pressure 134/73, pulse 63, temperature 98.1 F (36.7 C), resp. rate 18, height 5\' 11"  (1.803 m), weight (!) 143.1 kg, SpO2 93%. General appearance: alert, cooperative, appears stated age, and moderate distress  Disposition: Discharge disposition: 30-Still a Patient      T10 herniated ncleus pulposuswith myelopathy  Allergies as of 03/23/2023       Reactions   Sulfa Antibiotics Hives        Medication List     ASK your doctor about these medications    albuterol 108 (90 Base) MCG/ACT inhaler Commonly known as: VENTOLIN HFA Inhale 2 puffs into the lungs every 6 (six) hours as needed for wheezing or shortness of breath. Ask about: Which instructions should I use?   allopurinol 300 MG tablet Commonly known as: ZYLOPRIM Take 300 mg by mouth daily.   ALPRAZolam 0.5 MG tablet Commonly known as: XANAX Take 0.5 mg by mouth 2 (two) times daily as needed.   Mounjaro 10 MG/0.5ML Pen Generic drug: tirzepatide Inject 10 mg into the skin once a week.   pramipexole 0.5 MG tablet Commonly known as: MIRAPEX Take 0.5 mg by mouth in the morning and at bedtime.         SignedColetta [name] 04/10/2023, 4:28 PM

## 2023-04-13 DIAGNOSIS — D649 Anemia, unspecified: Secondary | ICD-10-CM

## 2023-04-13 DIAGNOSIS — N179 Acute kidney failure, unspecified: Secondary | ICD-10-CM | POA: Insufficient documentation

## 2023-04-13 DIAGNOSIS — K219 Gastro-esophageal reflux disease without esophagitis: Secondary | ICD-10-CM | POA: Insufficient documentation

## 2023-04-13 DIAGNOSIS — G8929 Other chronic pain: Secondary | ICD-10-CM | POA: Insufficient documentation

## 2023-04-13 DIAGNOSIS — K5900 Constipation, unspecified: Secondary | ICD-10-CM | POA: Insufficient documentation

## 2023-04-13 DIAGNOSIS — I82403 Acute embolism and thrombosis of unspecified deep veins of lower extremity, bilateral: Secondary | ICD-10-CM | POA: Insufficient documentation

## 2023-04-13 HISTORY — DX: Anemia, unspecified: D64.9

## 2023-04-13 HISTORY — DX: Chronic kidney disease, unspecified: N17.9

## 2023-04-17 ENCOUNTER — Telehealth: Payer: Self-pay | Admitting: *Deleted

## 2023-04-17 NOTE — Telephone Encounter (Signed)
Mrs Jaskiewicz called to report that this morning Mr More has blood on his clothing and reports the bleeding is coming from his testicles. I asked if she can see any skin tears or is it bruising she is seeing. She does not see the location but feels sure it is from his testicles (he is 300 lb and hard time moving him). I have asked that she call the PCP since they have been seen since discharge last Friday from inpt rehab.  I advised to ask PCP what to do, otherwise he will need to go to Urgent Care/ED to be evaluated. She is going to call the PCP.

## 2023-04-20 NOTE — Therapy (Unsigned)
OUTPATIENT OCCUPATIONAL THERAPY NEURO EVALUATION  Patient Name: Raymond [name] MRN: [id number] DOB:[birth year], 57 y.o., male Today's Date: 04/22/2023  PCP: Dr. Tawanna Solo PROVIDER: Dr. Carlis Abbott  END OF SESSION:  OT End of Session - 04/22/23 1314     Visit Number 1    Number of Visits 25    Date for OT Re-Evaluation 07/15/23    Authorization Type cigna    Authorization Time Period 12 weeks    OT Start Time 0933    OT Stop Time 1015    OT Time Calculation (min) 42 min             Past Medical History:  Diagnosis Date   Anxiety    COPD (chronic obstructive pulmonary disease) (HCC)    Diabetes mellitus without complication (HCC)    Diverticulosis    with hx of LGIB   Edema    GERD (gastroesophageal reflux disease)    GIB (gastrointestinal bleeding)    Gout    Heavy smoker    Hyperlipidemia    Hypertension    Iron deficiency anemia    Sleep apnea    uses a cpap   Past Surgical History:  Procedure Laterality Date   CHONDROPLASTY Left 06/28/2014   Procedure: CHONDROPLASTY;  Surgeon: Thera Flake., MD;  Location: [facility];  Service: Orthopedics;  Laterality: Left;   COLONOSCOPY     FOOT FASCIOTOMY     age 53-rt   KNEE ARTHROSCOPY WITH LATERAL MENISECTOMY Left 06/28/2014   Procedure: KNEE ARTHROSCOPY WITH LATERAL MENISECTOMY;  Surgeon: Thera Flake., MD;  Location: [facility];  Service: Orthopedics;  Laterality: Left;   KNEE ARTHROSCOPY WITH MEDIAL MENISECTOMY Left 06/28/2014   Procedure: LEFT KNEE ARTHROSCOPY CHONDROPLASTY/WITH MEDIAL/LATERAL MENISECTOMIES;  Surgeon: Thera Flake., MD;  Location: [facility];  Service: Orthopedics;  Laterality: Left;   ORIF RADIUS & ULNA FRACTURES  2007   left   THORACIC DISCECTOMY N/A 03/12/2023   Procedure: THORACIC LAMINECTOMY AND  DISCECTOMY;  Surgeon: Coletta Memos, MD;  Location: [facility] OR;  Service: Neurosurgery;  Laterality: N/A;   Patient Active Problem List   Diagnosis  Date Noted   Acute on chronic anemia 04/13/2023   Acute kidney injury superimposed on chronic kidney disease (HCC) 04/13/2023   GERD (gastroesophageal reflux disease) 04/13/2023   Constipation 04/13/2023   Chronic back pain 04/13/2023   DVT, bilateral lower limbs (HCC) 04/13/2023   Coping style affecting medical condition 03/27/2023   Thoracic myelopathy 03/23/2023   HNP (herniated nucleus pulposus with myelopathy), thoracic 03/12/2023   Hyperlipidemia 12/24/2022   Failure to thrive in adult 12/24/2022   Tobacco abuse 03/18/2018   BPH associated with nocturia 03/11/2017   History of substance abuse (HCC) 03/11/2017   Restless leg syndrome 03/11/2017   Restrictive lung disease 05/14/2016   OSA on CPAP 05/14/2016   Moderate COPD (chronic obstructive pulmonary disease) (HCC) 05/14/2016   Smoking greater than 40 pack years 05/14/2016   SOB (shortness of breath) 10/06/2014   Edema of extremities 10/06/2014   Benign essential HTN 10/06/2014   Type 2 diabetes mellitus, without long-term current use of insulin (HCC) 07/18/2011    ONSET DATE: 04/08/23- referral date  REFERRING DIAG:  Diagnosis  M47.14 (ICD-10-CM) - Other spondylosis with myelopathy, thoracic region    THERAPY DIAG:  Muscle weakness (generalized) - Plan: Ot plan of care cert/re-cert  Other abnormalities of gait and mobility - Plan: Ot plan of care cert/re-cert  Unsteadiness  on feet - Plan: Ot plan of care cert/re-cert  Rationale for Evaluation and Treatment: Rehabilitation  SUBJECTIVE:   SUBJECTIVE STATEMENT: Pt reports he wants to be able to walk Pt accompanied by: self  PERTINENT HISTORY: 57 y.o. male presenting with increased low back pain after a fall. Pt was found to have thoracic myelopathy due to a disc herniation at T10/11. He is now s/p T10 laminectomy and Discectomy. Pt received therapies at CIR and he d/c home 03/23/23 PMH of anxiety, COPD, DM, Edema, Gout, HTN, HLD, L medial menisectomy, ORIF L  radius and ulna.  PRECAUTIONS: Back  WEIGHT BEARING RESTRICTIONS: No  PAIN:  Are you having pain? Yes: NPRS scale: 5/10 Pain location: back Pain description: aching Aggravating factors: sitting still Relieving factors: meds  FALLS: Has patient fallen in last 6 months? Yes. Number of falls 1  LIVING ENVIRONMENT: Lives with: lives with their spouse Lives in: House/apartment Stairs:  has ramp Has following equipment at home: Walker - 2 wheeled and bed side commode  PLOF: Independent with basic ADLs  PATIENT GOALS: increase I with ADLS/ADLS  OBJECTIVE:  Note: Objective measures were completed at Evaluation unless otherwise noted.  HAND DOMINANCE: Right  ADLs: Overall ADLs: increased time required, uses w/c, pt is not able to ambulate or stand for ADLs at this time Transfers/ambulation related to ADLs: Eating: mod I Grooming: mod I UB Dressing: setup LB Dressing: mod A,  Toileting: uses bedside commode supervision-min A for scooting Bathing: sponge bathing, min A per pt report Tub Shower transfers: n/a  Equipment: bed side commode Unable to get into bathroom with w/c  IADLs: dependent with IADLS   MOBILITY STATUS:  supervision to min A for transfers  -scooting from w/c, pt is unable to stand for ADLs  Activitiy tolerance: 45 mins to hour with rest break    UPPER EXTREMITY ROM:    Active ROM Right eval Left eval  Shoulder flexion 90 110  Shoulder abduction 90 100  Shoulder adduction    Shoulder extension    Shoulder internal rotation    Shoulder external rotation    Elbow flexion [facility] WFL  Elbow extension [facility] WFL  Wrist flexion    Wrist extension    Wrist ulnar deviation    Wrist radial deviation    Wrist pronation    Wrist supination  90%  (Blank rows = not tested)  UPPER EXTREMITY MMT:     MMT Right eval Left eval  Shoulder flexion 3+/5 4/5  Shoulder abduction    Shoulder adduction    Shoulder extension    Shoulder internal rotation     Shoulder external rotation    Middle trapezius    Lower trapezius    Elbow flexion 4/5 4+/5  Elbow extension 4//5 4+/5  Wrist flexion    Wrist extension    Wrist ulnar deviation    Wrist radial deviation    Wrist pronation    Wrist supination    (Blank rows = not tested)  HAND FUNCTION: Grip strength: Right: 72 lbs; Left: 75 lbs  COORDINATION: 9 Hole Peg test: Right: 23.21 sec; Left: 32.52 sec  SENSATION: WFL      COGNITION: Overall cognitive status: Within functional limits for tasks assessed    OBSERVATIONS: Pleasant male is highly motivated to improve   TODAY'S TREATMENT:  DATE: 04/22/23   PATIENT EDUCATION: Education details: role of OT and potential goals Person educated: Patient Education method: Explanation and Verbal cues Education comprehension: verbalized understanding  HOME EXERCISE PROGRAM: n/a   GOALS: Goals reviewed with patient? Yes  SHORT TERM GOALS: Target date: 05/20/23  I with inital HEP.  Goal status: INITIAL  2.  Pt will pefrom LB dressing with min A  Goal status: INITIAL  3.  Pt will consistently perform toiltet transfers with supervision.  Goal status: INITIAL  4.  Pt will perform snack or beverage prep at a w/c level  Goal status: INITIAL  5. Pt will verbalize understanding of back precautions.  Goal status: inital    LONG TERM GOALS: Target date: 07/15/23  I with updated HEP. Baseline:  Goal status: INITIAL  2.  Pt will perform bathing with supervision/ set up  Goal status: INITIAL  3.  Pt will perform LB dressing with supervision/ set up.  Goal status: INITIAL  4.  Pt will demonstrate ability with stand for 5 mins with min A for ADLs/ IADLs.  Goal status: INITIAL  5.  Pt will perfrom simple home management at a walker level with min A.  Goal status:  INITIAL   ASSESSMENT:  CLINICAL IMPRESSION: Patient is a 57 y.o. male  who was seen today for occupational therapy evaluation for thoracic myelopathy due to a disc herniation at T10/11. He is now s/p T10 laminectomy and Discectomy. Pt received therapies at [facility] and he d/c home 03/23/23 PMH of anxiety, COPD, DM, Edema, Gout, HTN, HLD, L medial menisectomy, ORIF L radius and ulna..  Pt presetns with the following deficits: decreased strength, decreased functional mobility decreased balance which impredes perfromance of ADLS/IADLs.  PERFORMANCE DEFICITS: in functional skills including ADLs, IADLs, ROM, strength, pain, flexibility, mobility, balance, body mechanics, endurance, decreased knowledge of precautions, decreased knowledge of use of DME, and UE functional use,  and psychosocial skills including coping strategies, environmental adaptation, habits, interpersonal interactions, and routines and behaviors.   IMPAIRMENTS: are limiting patient from ADLs, IADLs, rest and sleep, work, play, leisure, and social participation.   CO-MORBIDITIES: Truax have co-morbidities  that affects occupational performance. Patient will benefit from skilled OT to address above impairments and improve overall function.  MODIFICATION OR ASSISTANCE TO COMPLETE EVALUATION: No modification of tasks or assist necessary to complete an evaluation.  OT OCCUPATIONAL PROFILE AND HISTORY: Detailed assessment: Review of records and additional review of physical, cognitive, psychosocial history related to current functional performance.  CLINICAL DECISION MAKING: LOW - limited treatment options, no task modification necessary  REHAB POTENTIAL: Good  EVALUATION COMPLEXITY: Low    PLAN:  OT FREQUENCY: 2x/week  OT DURATION: 12 weeks plus eval  PLANNED INTERVENTIONS: 97168 OT Re-evaluation, 97535 self care/ADL training, 16109 therapeutic exercise, 97530 therapeutic activity, 97112 neuromuscular re-education, 97140 manual  therapy, 97116 gait training, 60454 aquatic therapy, 97035 ultrasound, 97018 paraffin, 09811 moist heat, 97010 cryotherapy, passive range of motion, balance training, stair training, functional mobility training, psychosocial skills training, energy conservation, coping strategies training, patient/family education, and DME and/or AE instructions  RECOMMENDED OTHER SERVICES: PT  CONSULTED AND AGREED WITH PLAN OF CARE: Patient  PLAN FOR NEXT SESSION: UE HEP, review back precautions   Debra Calabretta, OT 04/22/2023, 1:41 PM

## 2023-04-21 ENCOUNTER — Other Ambulatory Visit: Payer: Self-pay

## 2023-04-21 ENCOUNTER — Ambulatory Visit: Payer: Managed Care, Other (non HMO) | Admitting: Occupational Therapy

## 2023-04-21 ENCOUNTER — Ambulatory Visit: Payer: Managed Care, Other (non HMO) | Attending: Physician Assistant | Admitting: Physical Therapy

## 2023-04-21 DIAGNOSIS — R2681 Unsteadiness on feet: Secondary | ICD-10-CM

## 2023-04-21 DIAGNOSIS — M25562 Pain in left knee: Secondary | ICD-10-CM | POA: Diagnosis present

## 2023-04-21 DIAGNOSIS — M25661 Stiffness of right knee, not elsewhere classified: Secondary | ICD-10-CM | POA: Insufficient documentation

## 2023-04-21 DIAGNOSIS — M25561 Pain in right knee: Secondary | ICD-10-CM | POA: Diagnosis present

## 2023-04-21 DIAGNOSIS — M6281 Muscle weakness (generalized): Secondary | ICD-10-CM | POA: Diagnosis present

## 2023-04-21 DIAGNOSIS — R2689 Other abnormalities of gait and mobility: Secondary | ICD-10-CM | POA: Diagnosis present

## 2023-04-21 DIAGNOSIS — M25662 Stiffness of left knee, not elsewhere classified: Secondary | ICD-10-CM | POA: Diagnosis present

## 2023-04-21 DIAGNOSIS — R29898 Other symptoms and signs involving the musculoskeletal system: Secondary | ICD-10-CM | POA: Diagnosis present

## 2023-04-21 DIAGNOSIS — G8929 Other chronic pain: Secondary | ICD-10-CM | POA: Diagnosis present

## 2023-04-21 NOTE — Therapy (Signed)
OUTPATIENT PHYSICAL THERAPY CERVICAL/THORACIC EVALUATION   Patient Name: Raymond [name] MRN: [id number] DOB:1965/06/23, 57 y.o., male Today's Date: 04/21/2023  END OF SESSION:  PT End of Session - 04/21/23 0841     Visit Number 1    Number of Visits 16    Date for PT Re-Evaluation 06/16/23    Authorization Type Cigna Managed    PT Start Time 0845    PT Stop Time 0925    PT Time Calculation (min) 40 min    Activity Tolerance Patient tolerated treatment well             Past Medical History:  Diagnosis Date   Anxiety    COPD (chronic obstructive pulmonary disease) (HCC)    Diabetes mellitus without complication (HCC)    Diverticulosis    with hx of LGIB   Edema    GERD (gastroesophageal reflux disease)    GIB (gastrointestinal bleeding)    Gout    Heavy smoker    Hyperlipidemia    Hypertension    Iron deficiency anemia    Sleep apnea    uses a cpap   Past Surgical History:  Procedure Laterality Date   CHONDROPLASTY Left 06/28/2014   Procedure: CHONDROPLASTY;  Surgeon: Thera Flake., MD;  Location: [facility];  Service: Orthopedics;  Laterality: Left;   COLONOSCOPY     FOOT FASCIOTOMY     age 65-rt   KNEE ARTHROSCOPY WITH LATERAL MENISECTOMY Left 06/28/2014   Procedure: KNEE ARTHROSCOPY WITH LATERAL MENISECTOMY;  Surgeon: Thera Flake., MD;  Location: [facility];  Service: Orthopedics;  Laterality: Left;   KNEE ARTHROSCOPY WITH MEDIAL MENISECTOMY Left 06/28/2014   Procedure: LEFT KNEE ARTHROSCOPY CHONDROPLASTY/WITH MEDIAL/LATERAL MENISECTOMIES;  Surgeon: Thera Flake., MD;  Location: [facility];  Service: Orthopedics;  Laterality: Left;   ORIF RADIUS & ULNA FRACTURES  2007   left   THORACIC DISCECTOMY N/A 03/12/2023   Procedure: THORACIC LAMINECTOMY AND  DISCECTOMY;  Surgeon: Coletta Memos, MD;  Location: [facility] OR;  Service: Neurosurgery;  Laterality: N/A;   Patient Active Problem List   Diagnosis Date Noted   Acute  on chronic anemia 04/13/2023   Acute kidney injury superimposed on chronic kidney disease (HCC) 04/13/2023   GERD (gastroesophageal reflux disease) 04/13/2023   Constipation 04/13/2023   Chronic back pain 04/13/2023   DVT, bilateral lower limbs (HCC) 04/13/2023   Coping style affecting medical condition 03/27/2023   Thoracic myelopathy 03/23/2023   HNP (herniated nucleus pulposus with myelopathy), thoracic 03/12/2023   Hyperlipidemia 12/24/2022   Failure to thrive in adult 12/24/2022   Tobacco abuse 03/18/2018   BPH associated with nocturia 03/11/2017   History of substance abuse (HCC) 03/11/2017   Restless leg syndrome 03/11/2017   Restrictive lung disease 05/14/2016   OSA on CPAP 05/14/2016   Moderate COPD (chronic obstructive pulmonary disease) (HCC) 05/14/2016   Smoking greater than 40 pack years 05/14/2016   SOB (shortness of breath) 10/06/2014   Edema of extremities 10/06/2014   Benign essential HTN 10/06/2014   Type 2 diabetes mellitus, without long-term current use of insulin (HCC) 07/18/2011    PCP: Venetia Constable, MD  REFERRING PROVIDER:  Charlton Amor, PA-C   REFERRING DIAG: [phone] (ICD-10-CM) - Other spondylosis with myelopathy, thoracic region   THERAPY DIAG:  Muscle weakness (generalized)  Other abnormalities of gait and mobility  Other symptoms and signs involving the musculoskeletal system  Chronic pain of right knee  Chronic pain of left knee  Stiffness of left knee, not elsewhere classified  Stiffness of right knee, not elsewhere classified  Rationale for Evaluation and Treatment: Rehabilitation  ONSET DATE: October 2024  SUBJECTIVE:                                                                                                                                                                                                         SUBJECTIVE STATEMENT: Pt states he fell, broke his ankle and was found to later have a ruptured disc  and had to get back surgery. Went to inpatient rehab x 3 weeks. Felt like he built up his upper body. Has now been home x 1 week. States he has not been doing much since then -- has done some sitting exercises. Was able to stand for 3 min bouts for a total of about 12 min using RW at inpatient rehab. Has history of bad knees and will be getting gel shots. 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 He was found to have large HNP T10/T11 and underwent T10 laminectomy with discectomy by Dr. Franky Macho on 03/12/2023   PAIN:  Are you having pain? No  PRECAUTIONS: None  RED FLAGS: None     WEIGHT BEARING RESTRICTIONS: No  FALLS:  Has patient fallen in last 6 months? Yes. Number of falls 1 -- last fall Oct 2024  LIVING ENVIRONMENT: Lives with: lives with their spouse Lives in: House/apartment Stairs:  ramp Has following equipment at home: Environmental consultant - 2 wheeled, Wheelchair (manual), and upright walker, hospital bed  OCCUPATION: Retired  PLOF:  Able to do his own ADLs but easier to have wife assist him  PATIENT GOALS: Walk with the walker at least household distances  NEXT MD VISIT: 04/21/23 [place]  OBJECTIVE:  Note: Objective measures were completed at Evaluation unless otherwise noted.  DIAGNOSTIC FINDINGS:  03/12/23 MRI THORACIC SPINE IMPRESSION:   1. Moderate-sized lobulated left subarticular to foraminal disc protrusion at T10-11 with resultant severe spinal stenosis. Cord is compressed and deviated to the right at this level. Associated cord signal changes concerning for edema and/or myelomalacia. 2. No other acute abnormality within the thoracic spine. 3. Degenerative spondylosis at T8-9 through  T11-12 without significant spinal stenosis. Associated moderate to severe bilateral foraminal narrowing at these levels  as above.  MRI LUMBAR SPINE IMPRESSION:   1. No acute abnormality within the lumbar spine. 2. Multifactorial degenerative changes at L4-5 with resultant mild canal with severe left and moderate right lateral recess stenosis, with severe bilateral L4 foraminal narrowing. 3. Additional mild noncompressive disc bulging and facet hypertrophy elsewhere within the lumbar spine as above. No other significant stenosis or frank neural impingement.   Critical Value/emergent results were called by telephone at the time of interpretation on 03/12/2023 at 12:41 am to provider Dr. Madilyn Hook, who verbally acknowledged these results.  PATIENT SURVEYS:  FOTO 42; predicted 76  COGNITION: Overall cognitive status: Within functional limits for tasks assessed  SENSATION: WFL  POSTURE: rounded shoulders and forward head  PALPATION: No overt tenderness to palpation   CERVICAL ROM: WNL  LOWER EXTREMITY MMT:    MMT Right eval Left eval  Hip flexion 3+ 3  Hip extension 3+ 3+  Hip abduction 3+ 3+  Hip adduction    Hip internal rotation    Hip external rotation    Knee flexion 4- 3+  Knee extension 4- 3+  Ankle dorsiflexion    Ankle plantarflexion    Ankle inversion    Ankle eversion     (Blank rows = not tested)   FUNCTIONAL TESTS:  Transfers:  Chair to bed: Multiple seated scoots with UEs  Sit<>stand: max A -- limited due to knee pain Supine to sit<>sit: min A for trunk and LE negotiation. Not performing log rolling  TODAY'S TREATMENT:                                                                                                                              DATE: 04/21/23 See HEP below   PATIENT EDUCATION:  Education details: Exam findings, POC, initial HEP Person educated: Patient Education method: Explanation, Demonstration, and Handouts Education  comprehension: verbalized understanding, returned demonstration, and needs further education  HOME EXERCISE PROGRAM: Access Code: [email or web address] URL: [email or web address] Date: 04/21/2023 Prepared by: Vernon Prey April Kirstie Peri  Exercises - Staggered Bridge  - 1 x daily - 7 x weekly - 2 sets - 10 reps - Hooklying Isometric Clamshell  - 1 x daily - 7 x weekly - 2 sets - 10 reps - Small Range Straight Leg Raise  - 1 x daily - 7 x weekly - 2 sets - 10 reps  ASSESSMENT:  CLINICAL IMPRESSION: Patient is a 57 y.o. M who was seen today for physical therapy evaluation and treatment for thoracic myelopathy. PMH significant for bilat knee OA (pt to get gel shots and is interested in future TKA once he gets his weight down) and T10 laminectomy with discectomy by Dr. Franky Macho on 03/12/2023. Went to inpatient rehab x 3 weeks. Has now been at home x 1 week. Pt states he does not currently have any known precautions. Assessment significant for gross bilat LE weakness (L LE weaker than R) affecting standing tolerance, transfers and balance. Pt is limited  due to his knee pain. Pt will highly benefit from PT to address these deficits to reach pt's goals for increased mobility and amb.   OBJECTIVE IMPAIRMENTS: Abnormal gait, decreased balance, decreased endurance, decreased mobility, difficulty walking, decreased ROM, decreased strength, increased edema, improper body mechanics, postural dysfunction, obesity, and pain.   ACTIVITY LIMITATIONS: lifting, standing, squatting, sleeping, stairs, transfers, bed mobility, dressing, hygiene/grooming, and locomotion level  PARTICIPATION LIMITATIONS: meal prep, cleaning, laundry, driving, shopping, community activity, and yard work  PERSONAL FACTORS: Age, Fitness, Past/current experiences, and Time since onset of injury/illness/exacerbation are also affecting patient's functional outcome.   REHAB POTENTIAL: Good  CLINICAL DECISION MAKING: Evolving/moderate  complexity  EVALUATION COMPLEXITY: Moderate   GOALS: Goals reviewed with patient? Yes  SHORT TERM GOALS: Target date: 06/02/2023   Pt will be ind with initial HEP Baseline:  Goal status: INITIAL  2.  Pt will be able to tolerate standing x 5 min to brush his teeth Baseline:  Goal status: INITIAL  3.  Pt will be able to perform stand pivot t/f with RW min a Baseline:  Goal status: INITIAL   LONG TERM GOALS: Target date: 07/14/2023   Pt will be ind with management and progression of HEP Baseline:  Goal status: INITIAL  2.  Pt will be able to amb at least 20' with RW min A for home mobility Baseline:  Goal status: INITIAL  3.  Pt will be able to perform 5x STS to demo increased functional LE strength Baseline: Unable to perform complete STS without max A Goal status: INITIAL  4.  Pt will have increased FOTO to >/=54 Baseline:  Goal status: INITIAL     PLAN:  PT FREQUENCY: 2x/week  PT DURATION: 12 weeks  PLANNED INTERVENTIONS: 97164- PT Re-evaluation, 97110-Therapeutic exercises, 97530- Therapeutic activity, [id number]- Neuromuscular re-education, 97535- Self Care, 60454- Manual therapy, [id number]- Gait training, [phone]- Orthotic Fit/training, [phone]- Aquatic Therapy, 97014- Electrical stimulation (unattended), [id number]- Ultrasound, 29562- Ionotophoresis 4mg /ml Dexamethasone, Patient/Family education, Balance training, Stair training, Taping, Dry Needling, Joint mobilization, Spinal mobilization, Cryotherapy, and Moist heat  PLAN FOR NEXT SESSION: Assess response to HEP. Continue to work on LE strengthening and transfers/standing.    Ormand Senn April Ma L Shreyan Hinz, PT 04/21/2023, 12:29 PM

## 2023-04-22 ENCOUNTER — Telehealth: Payer: Self-pay

## 2023-04-22 NOTE — Telephone Encounter (Signed)
Hospital follow up 05/12/2023 with Dr. Carlis Abbott.  Raymond [name] was told to wean himself off the Oxycodone 5 MG. He has #17 on hand. Patient wanted to know if he is doing well with the count?  He was advised per Rx instructions.  (Take 1-2 tablets (5-10 mg total) by mouth every 4 (four) hours as needed for severe pain (pain score 7-10).  Also to make the Rx last.  He also stated he needs a new wheelchair.  Call back phone [phone].

## 2023-04-22 NOTE — Telephone Encounter (Signed)
Notified pt that DR Carlis Abbott said he is using opioids appropriately and if he needs refill he should call his surgeon.

## 2023-04-23 ENCOUNTER — Ambulatory Visit: Payer: Managed Care, Other (non HMO) | Admitting: Physical Therapy

## 2023-04-23 DIAGNOSIS — M25661 Stiffness of right knee, not elsewhere classified: Secondary | ICD-10-CM

## 2023-04-23 DIAGNOSIS — M6281 Muscle weakness (generalized): Secondary | ICD-10-CM | POA: Diagnosis not present

## 2023-04-23 DIAGNOSIS — R29898 Other symptoms and signs involving the musculoskeletal system: Secondary | ICD-10-CM

## 2023-04-23 DIAGNOSIS — M25662 Stiffness of left knee, not elsewhere classified: Secondary | ICD-10-CM

## 2023-04-23 DIAGNOSIS — R2681 Unsteadiness on feet: Secondary | ICD-10-CM

## 2023-04-23 DIAGNOSIS — G8929 Other chronic pain: Secondary | ICD-10-CM

## 2023-04-23 DIAGNOSIS — R2689 Other abnormalities of gait and mobility: Secondary | ICD-10-CM

## 2023-04-23 NOTE — Therapy (Signed)
OUTPATIENT PHYSICAL THERAPY CERVICAL/THORACIC TREATMENT   Patient Name: Raymond [name] MRN: [id number] DOB:September 04, 1965, 57 y.o., male Today's Date: 04/23/2023  END OF SESSION:  PT End of Session - 04/23/23 1047     Visit Number 2    Number of Visits 25    Date for PT Re-Evaluation 06/16/23    Authorization Type [employer] Managed    PT Start Time 1100    PT Stop Time 1140    PT Time Calculation (min) 40 min    Activity Tolerance Patient tolerated treatment well              Past Medical History:  Diagnosis Date   Anxiety    COPD (chronic obstructive pulmonary disease) (HCC)    Diabetes mellitus without complication (HCC)    Diverticulosis    with hx of LGIB   Edema    GERD (gastroesophageal reflux disease)    GIB (gastrointestinal bleeding)    Gout    Heavy smoker    Hyperlipidemia    Hypertension    Iron deficiency anemia    Sleep apnea    uses a cpap   Past Surgical History:  Procedure Laterality Date   CHONDROPLASTY Left 06/28/2014   Procedure: CHONDROPLASTY;  Surgeon: Thera Flake., MD;  Location: [facility];  Service: Orthopedics;  Laterality: Left;   COLONOSCOPY     FOOT FASCIOTOMY     age 57-rt   KNEE ARTHROSCOPY WITH LATERAL MENISECTOMY Left 06/28/2014   Procedure: KNEE ARTHROSCOPY WITH LATERAL MENISECTOMY;  Surgeon: Thera Flake., MD;  Location: [facility];  Service: Orthopedics;  Laterality: Left;   KNEE ARTHROSCOPY WITH MEDIAL MENISECTOMY Left 06/28/2014   Procedure: LEFT KNEE ARTHROSCOPY CHONDROPLASTY/WITH MEDIAL/LATERAL MENISECTOMIES;  Surgeon: Thera Flake., MD;  Location: [facility];  Service: Orthopedics;  Laterality: Left;   ORIF RADIUS & ULNA FRACTURES  2007   left   THORACIC DISCECTOMY N/A 03/12/2023   Procedure: THORACIC LAMINECTOMY AND  DISCECTOMY;  Surgeon: Coletta Memos, MD;  [facility];  Service: Neurosurgery;  Laterality: N/A;   Patient Active Problem List   Diagnosis Date Noted   Acute  on chronic anemia 04/13/2023   Acute kidney injury superimposed on chronic kidney disease (HCC) 04/13/2023   GERD (gastroesophageal reflux disease) 04/13/2023   Constipation 04/13/2023   Chronic back pain 04/13/2023   DVT, bilateral lower limbs (HCC) 04/13/2023   Coping style affecting medical condition 03/27/2023   Thoracic myelopathy 03/23/2023   HNP (herniated nucleus pulposus with myelopathy), thoracic 03/12/2023   Hyperlipidemia 12/24/2022   Failure to thrive in adult 12/24/2022   Tobacco abuse 03/18/2018   BPH associated with nocturia 03/11/2017   History of substance abuse (HCC) 03/11/2017   Restless leg syndrome 03/11/2017   Restrictive lung disease 05/14/2016   OSA on CPAP 05/14/2016   Moderate COPD (chronic obstructive pulmonary disease) (HCC) 05/14/2016   Smoking greater than 40 pack years 05/14/2016   SOB (shortness of breath) 10/06/2014   Edema of extremities 10/06/2014   Benign essential HTN 10/06/2014   Type 2 diabetes mellitus, without long-term current use of insulin (HCC) 07/18/2011    PCP: Venetia Constable, MD  REFERRING PROVIDER:  Charlton Amor, PA-C   REFERRING DIAG: [phone] (ICD-10-CM) - Other spondylosis with myelopathy, thoracic region   THERAPY DIAG:  No diagnosis found.  Rationale for Evaluation and Treatment: Rehabilitation  ONSET DATE: October 2024  SUBJECTIVE:  SUBJECTIVE STATEMENT: Pt states he got gel shots in his knees. They are feeling better today. He reports he has not done any of his HEP  From eval: Pt states he fell, broke his ankle and was found to later have a ruptured disc and had to get back surgery. Went to inpatient rehab x 3 weeks. Felt like he built up his upper body. Has now been home x 1 week. States he has not been  doing much since then -- has done some sitting exercises. Was able to stand for 3 min bouts for a total of about 12 min using RW at inpatient rehab. Has history of bad knees and will be getting gel shots. 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 He was found to have large HNP T10/T11 and underwent T10 laminectomy with discectomy by Dr. Franky Macho on 03/12/2023   PAIN:  Are you having pain? No  PRECAUTIONS: None  RED FLAGS: None     WEIGHT BEARING RESTRICTIONS: No  FALLS:  Has patient fallen in last 6 months? Yes. Number of falls 1 -- last fall Oct 2024  LIVING ENVIRONMENT: Lives with: lives with their spouse Lives in: House/apartment Stairs:  ramp Has following equipment at home: Environmental consultant - 2 wheeled, Wheelchair (manual), and upright walker, hospital bed  OCCUPATION: Retired  PLOF:  Able to do his own ADLs but easier to have wife assist him  PATIENT GOALS: Walk with the walker at least household distances  NEXT MD VISIT: 04/21/23 [place]  OBJECTIVE:  Note: Objective measures were completed at Evaluation unless otherwise noted.  DIAGNOSTIC FINDINGS:  03/12/23 MRI THORACIC SPINE IMPRESSION:   1. Moderate-sized lobulated left subarticular to foraminal disc protrusion at T10-11 with resultant severe spinal stenosis. Cord is compressed and deviated to the right at this level. Associated cord signal changes concerning for edema and/or myelomalacia. 2. No other acute abnormality within the thoracic spine. 3. Degenerative spondylosis at T8-9 through T11-12 without significant spinal stenosis. Associated moderate to severe bilateral foraminal narrowing at these levels as above.  MRI LUMBAR SPINE  IMPRESSION:   1. No acute abnormality within the lumbar spine. 2. Multifactorial degenerative changes at L4-5 with resultant mild canal with severe left and moderate right lateral recess stenosis, with severe bilateral L4 foraminal narrowing. 3. Additional mild noncompressive disc bulging and facet hypertrophy elsewhere within the lumbar spine as above. No other significant stenosis or frank neural impingement.   Critical Value/emergent results were called by telephone at the time of interpretation on 03/12/2023 at 12:41 am to provider Dr. Madilyn Hook, who verbally acknowledged these results.  PATIENT SURVEYS:  FOTO 42; predicted 30  COGNITION: Overall cognitive status: Within functional limits for tasks assessed  SENSATION: WFL  POSTURE: rounded shoulders and forward head  PALPATION: No overt tenderness to palpation   CERVICAL ROM: WNL  LOWER EXTREMITY MMT:    MMT Right eval Left eval  Hip flexion 3+ 3  Hip extension 3+ 3+  Hip abduction 3+ 3+  Hip adduction    Hip internal rotation    Hip external rotation    Knee flexion 4- 3+  Knee extension 4- 3+  Ankle dorsiflexion    Ankle plantarflexion    Ankle inversion    Ankle eversion     (  Blank rows = not tested)   FUNCTIONAL TESTS:  Transfers:  Chair to bed: Multiple seated scoots with UEs  Sit<>stand: max A -- limited due to knee pain Supine to sit<>sit: min A for trunk and LE negotiation. Not performing log rolling  TODAY'S TREATMENT:                                                                                                                              DATE:  04/23/23 Seated heel slide 2x10 Transfer x2; min A to stabilize w/c while getting to bed. Pt utilizing multiple scoots and UEs SLR short range x10 (>10 deg extensor lag bilat) Quad set 2x10 SAQ 2# ankle weight 2x10 Supine clamshell green TB 2x10 Bridge 2x10 Supine to sit log roll with SBA for v/cs In // bars pulling up for 1/4 to standing 2x5  In  w/c pushing with legs only 2x10' In w/c pulling with legs only 2x10'  04/21/23 See HEP below   PATIENT EDUCATION:  Education details: Exam findings, POC, initial HEP Person educated: Patient Education method: Explanation, Demonstration, and Handouts Education comprehension: verbalized understanding, returned demonstration, and needs further education  HOME EXERCISE PROGRAM: Access Code: [email or web address] URL: [email or web address] Date: 04/21/2023 Prepared by: Vernon Prey April Kirstie Peri  Exercises - Staggered Bridge  - 1 x daily - 7 x weekly - 2 sets - 10 reps - Hooklying Isometric Clamshell  - 1 x daily - 7 x weekly - 2 sets - 10 reps - Small Range Straight Leg Raise  - 1 x daily - 7 x weekly - 2 sets - 10 reps  ASSESSMENT:  CLINICAL IMPRESSION: Treatment focused on strengthening LEs. Worked on pushing through LEs to come into partial standing in // bars. Will likely need +2 assist for safety if attempting to come into full standing. Knees are feeling better today after gel shots. Encouraged pt to utilize more log roll to come up from the bed (he will likely need reinforcement for this). Tasked pt to work on trying to use his legs more during w/c mobility at home and not just keep them resting in the foot plate. Discussed with pt about his w/c -- he states he is supposed to get a Quickie w/c but lost the paper work for it. Advised pt to try and call inpatient rehab to f/u on this.   From eval: Patient is a 57 y.o. M who was seen today for physical therapy evaluation and treatment for thoracic myelopathy. PMH significant for bilat knee OA (pt to get gel shots and is interested in future TKA once he gets his weight down) and T10 laminectomy with discectomy by Dr. Franky Macho on 03/12/2023. Went to inpatient rehab x 3 weeks. Has now been at home x 1 week. Pt states he does not currently have any known precautions. Assessment significant for gross bilat LE weakness (L LE weaker than R)  affecting standing tolerance, transfers and balance. Pt is limited due to  his knee pain. Pt will highly benefit from PT to address these deficits to reach pt's goals for increased mobility and amb.   OBJECTIVE IMPAIRMENTS: Abnormal gait, decreased balance, decreased endurance, decreased mobility, difficulty walking, decreased ROM, decreased strength, increased edema, improper body mechanics, postural dysfunction, obesity, and pain.     GOALS: Goals reviewed with patient? Yes  SHORT TERM GOALS: Target date: 06/02/2023   Pt will be ind with initial HEP Baseline:  Goal status: INITIAL  2.  Pt will be able to tolerate standing x 5 min to brush his teeth Baseline:  Goal status: INITIAL  3.  Pt will be able to perform stand pivot t/f with RW min a Baseline:  Goal status: INITIAL   LONG TERM GOALS: Target date: 07/14/2023   Pt will be ind with management and progression of HEP Baseline:  Goal status: INITIAL  2.  Pt will be able to amb at least 20' with RW min A for home mobility Baseline:  Goal status: INITIAL  3.  Pt will be able to perform 5x STS to demo increased functional LE strength Baseline: Unable to perform complete STS without max A Goal status: INITIAL  4.  Pt will have increased FOTO to >/=54 Baseline:  Goal status: INITIAL     PLAN:  PT FREQUENCY: 2x/week  PT DURATION: 12 weeks  PLANNED INTERVENTIONS: 97164- PT Re-evaluation, 97110-Therapeutic exercises, 97530- Therapeutic activity, [id number]- Neuromuscular re-education, 97535- Self Care, 16109- Manual therapy, [id number]- Gait training, [phone]- Orthotic Fit/training, [phone]- Aquatic Therapy, 97014- Electrical stimulation (unattended), [id number]- Ultrasound, 91478- Ionotophoresis 4mg /ml Dexamethasone, Patient/Family education, Balance training, Stair training, Taping, Dry Needling, Joint mobilization, Spinal mobilization, Cryotherapy, and Moist heat  PLAN FOR NEXT SESSION: Assess response to HEP. Continue to work on LE  strengthening and transfers/standing.    Lucca Ballo April Ma L Davion Meara, PT 04/23/2023, 10:56 AM

## 2023-04-27 ENCOUNTER — Ambulatory Visit: Payer: Managed Care, Other (non HMO) | Admitting: Physical Therapy

## 2023-04-27 DIAGNOSIS — Z0289 Encounter for other administrative examinations: Secondary | ICD-10-CM

## 2023-04-28 ENCOUNTER — Encounter: Payer: Self-pay | Admitting: Bariatrics

## 2023-04-28 ENCOUNTER — Ambulatory Visit (INDEPENDENT_AMBULATORY_CARE_PROVIDER_SITE_OTHER): Payer: Managed Care, Other (non HMO) | Admitting: Bariatrics

## 2023-04-28 VITALS — BP 172/99 | HR 85 | Temp 98.0°F | Ht 71.0 in | Wt 362.0 lb

## 2023-04-28 DIAGNOSIS — E7849 Other hyperlipidemia: Secondary | ICD-10-CM

## 2023-04-28 DIAGNOSIS — E785 Hyperlipidemia, unspecified: Secondary | ICD-10-CM

## 2023-04-28 DIAGNOSIS — I10 Essential (primary) hypertension: Secondary | ICD-10-CM | POA: Diagnosis not present

## 2023-04-28 DIAGNOSIS — Z6841 Body Mass Index (BMI) 40.0 and over, adult: Secondary | ICD-10-CM | POA: Diagnosis not present

## 2023-04-28 DIAGNOSIS — E66813 Obesity, class 3: Secondary | ICD-10-CM

## 2023-04-28 NOTE — Progress Notes (Signed)
Office: [phone]  /  Fax: [phone]   Initial Visit  Raymond [name] was seen in clinic today to evaluate for obesity. He is interested in losing weight to improve overall health and reduce the risk of weight related complications. He presents today to review program treatment options, initial physical assessment, and evaluation.     He was referred by: Specialist  He needs bilateral  knee replacement  When asked what else they would like to accomplish? He states: Adopt healthier eating patterns and Improve quality of life  When asked how has your weight affected you? He states: Contributed to medical problems, Having fatigue, Having poor endurance, and Problems with eating patterns  Some associated conditions: Hypertension, Arthritis:knees and hips, Hyperlipidemia, OSA, Diabetes, Lung disease, and Vitamin D Deficiency  Contributing factors: Family history of obesity, Consumption of processed foods, and Eating patterns  Weight promoting medications identified: None Mobic  Current nutrition plan: Low-carb and High-protein  Current level of physical activity: Limited due to chronic pain or orthopedic problems  Current or previous pharmacotherapy: GLP-1 + GIP  Response to medication:  Taking Mounjaro He was glipizide and Metformin.   Past medical history includes:   Past Medical History:  Diagnosis Date   Anxiety    COPD (chronic obstructive pulmonary disease) (HCC)    Diabetes mellitus without complication (HCC)    Diverticulosis    with hx of LGIB   Edema    GERD (gastroesophageal reflux disease)    GIB (gastrointestinal bleeding)    Gout    Heavy smoker    Hyperlipidemia    Hypertension    Iron deficiency anemia    Sleep apnea    uses a cpap     Objective:   BP (!) 172/99   Pulse 85   Temp 98 F (36.7 C)   Ht 5\' 11"  (1.803 m)   Wt (!) 362 lb (164.2 kg)   SpO2 97%   BMI 50.49 kg/m  He was weighed on the bioimpedance scale: Body mass index is 50.49  kg/m.  Peak Weight:398 lbs , Body Fat%:unknown, Visceral Fat Rating:unknown, Weight trend over the last 12 months: Unchanged He is in a wheelchair and unable to stand, thus we could not use the bio-impedence scale.  General:  Alert, oriented and cooperative. Patient is in no acute distress.  Respiratory: Normal respiratory effort, no problems with respiration noted  Extremities: Normal range of motion.    Mental Status: Normal mood and affect. Normal behavior. Normal judgment and thought content.   DIAGNOSTIC DATA REVIEWED:  BMET    Component Value Date/Time   NA 137 04/10/2023 0502   K 4.2 04/10/2023 0502   CL 101 04/10/2023 0502   CO2 23 04/10/2023 0502   GLUCOSE 111 (H) 04/10/2023 0502   BUN 23 (H) 04/10/2023 0502   CREATININE 1.82 (H) 04/10/2023 0502   CALCIUM 9.1 04/10/2023 0502   GFRNONAA 42 (L) 04/10/2023 0502   GFRAA >90 07/19/2014 1846   Lab Results  Component Value Date   HGBA1C 6.0 (H) 03/12/2023   HGBA1C 6.3 (H) 12/25/2022   No results found for: "INSULIN" CBC    Component Value Date/Time   WBC 9.4 04/06/2023 0526   RBC 3.82 (L) 04/06/2023 0526   HGB 11.0 (L) 04/06/2023 0526   HCT 33.7 (L) 04/06/2023 0526   PLT 316 04/06/2023 0526   MCV 88.2 04/06/2023 0526   MCH 28.8 04/06/2023 0526   MCHC 32.6 04/06/2023 0526   RDW 15.7 (H) 04/06/2023 0526  Iron/TIBC/Ferritin/ %Sat No results found for: "IRON", "TIBC", "FERRITIN", "IRONPCTSAT" Lipid Panel  No results found for: "CHOL", "TRIG", "HDL", "CHOLHDL", "VLDL", "LDLCALC", "LDLDIRECT" Hepatic Function Panel     Component Value Date/Time   PROT 7.1 03/24/2023 0730   ALBUMIN 2.6 (L) 03/24/2023 0730   AST 16 03/24/2023 0730   ALT 21 03/24/2023 0730   ALKPHOS 79 03/24/2023 0730   BILITOT 0.4 03/24/2023 0730      Component Value Date/Time   TSH 1.41 10/05/2014 1437     Assessment and Plan:   Hypertension Hypertension poorly controlled.  Medication(s): Cozaar 50 mg daily , Doxazocin 2 mg  BP  Readings from Last 3 Encounters:  04/28/23 (!) 172/99  04/10/23 (!) 143/77  03/24/23 134/73   Lab Results  Component Value Date   CREATININE 1.82 (H) 04/10/2023   CREATININE 1.53 (H) 04/09/2023   CREATININE 1.62 (H) 04/08/2023   Lab Results  Component Value Date   GFR 92.19 10/05/2014    Plan: Continue all antihypertensives at current dosages. Call PCP and tell to increase to either medication or to add in another medication.  He is not taking his Coreg 25 mg that was prescribed I am not sure why he is not taking it, nor does he or his wife.  He or his wife will call to confirm his medications and see if he needs an additional medication.  Will keep sodium content to 1,500 mg or less per day.    Hyperlipidemia LDL is at goal.per the patient.  Medication(s): Lipitor  Cardiovascular risk factors: advanced age (older than 87 for men, 30 for women), diabetes mellitus, dyslipidemia, family history of premature cardiovascular disease, hypertension, male gender, obesity (BMI >= 30 kg/m2), and sedentary lifestyle  No results found for: "CHOL", "HDL", "LDLCALC", "LDLDIRECT", "TRIG", "CHOLHDL" Lab Results  Component Value Date   ALT 21 03/24/2023   AST 16 03/24/2023   ALKPHOS 79 03/24/2023   BILITOT 0.4 03/24/2023   The ASCVD Risk score (Arnett DK, et al., 2019) failed to calculate for the following reasons:   Cannot find a previous HDL lab   Cannot find a previous total cholesterol lab  Plan:  Continue statin.  Information sheet on healthy vs unhealthy fats.  Will avoid all trans fats.  Will read labels Will minimize saturated fats except the following: low fat meats in moderation, diary, and limited dark chocolate.      Morbid Obesity: Current BMI 50.49    Obesity Treatment / Action Plan:  Patient will work on garnering support from family and friends to begin weight loss journey. Will work on eliminating or reducing the presence of highly palatable, calorie dense foods in  the home. Will complete provided nutritional and psychosocial assessment questionnaire before the next appointment. Will be scheduled for indirect calorimetry to determine resting energy expenditure in a fasting state.  This will allow [place] to create a reduced calorie, high-protein meal plan to promote loss of fat mass while preserving muscle mass. Counseled on the health benefits of losing 5%-15% of total body weight. Was counseled on nutritional approaches to weight loss and benefits of reducing processed foods and consuming plant-based foods and high quality protein as part of nutritional weight management. Was counseled on pharmacotherapy and role as an adjunct in weight management.   Obesity Education Performed Today:  He was weighed on the bioimpedance scale and results were discussed and documented in the synopsis.  We discussed obesity as a disease and the importance of a more detailed  evaluation of all the factors contributing to the disease.  We discussed the importance of long term lifestyle changes which include nutrition, exercise and behavioral modifications as well as the importance of customizing this to his specific health and social needs.  We discussed the benefits of reaching a healthier weight to alleviate the symptoms of existing conditions and reduce the risks of the biomechanical, metabolic and psychological effects of obesity.  Discussed New Patient/Late Arrival, and Cancellation Policies. Patient voiced understanding and allowed to ask questions.   Raymond [name] appears to be in the action stage of change and states they are ready to start intensive lifestyle modifications and behavioral modifications.  30 minutes was spent today on this visit including the above counseling, pre-visit chart review, and post-visit documentation.  Reviewed by clinician on day of visit: allergies, medications, problem list, medical history, surgical history, family history, social history,  and previous encounter notes.    Raymond Cuffe A. Lorretta HarpO.

## 2023-04-29 ENCOUNTER — Ambulatory Visit: Payer: Managed Care, Other (non HMO) | Admitting: Physical Therapy

## 2023-05-06 ENCOUNTER — Encounter: Payer: Self-pay | Admitting: Physical Therapy

## 2023-05-06 ENCOUNTER — Ambulatory Visit: Payer: Managed Care, Other (non HMO) | Admitting: Occupational Therapy

## 2023-05-06 ENCOUNTER — Ambulatory Visit: Payer: Managed Care, Other (non HMO) | Attending: Physician Assistant | Admitting: Physical Therapy

## 2023-05-06 DIAGNOSIS — M25561 Pain in right knee: Secondary | ICD-10-CM | POA: Diagnosis present

## 2023-05-06 DIAGNOSIS — M25612 Stiffness of left shoulder, not elsewhere classified: Secondary | ICD-10-CM | POA: Insufficient documentation

## 2023-05-06 DIAGNOSIS — R29898 Other symptoms and signs involving the musculoskeletal system: Secondary | ICD-10-CM | POA: Diagnosis present

## 2023-05-06 DIAGNOSIS — M25661 Stiffness of right knee, not elsewhere classified: Secondary | ICD-10-CM | POA: Insufficient documentation

## 2023-05-06 DIAGNOSIS — R2681 Unsteadiness on feet: Secondary | ICD-10-CM | POA: Insufficient documentation

## 2023-05-06 DIAGNOSIS — M25662 Stiffness of left knee, not elsewhere classified: Secondary | ICD-10-CM | POA: Insufficient documentation

## 2023-05-06 DIAGNOSIS — M25611 Stiffness of right shoulder, not elsewhere classified: Secondary | ICD-10-CM | POA: Diagnosis present

## 2023-05-06 DIAGNOSIS — M79621 Pain in right upper arm: Secondary | ICD-10-CM | POA: Insufficient documentation

## 2023-05-06 DIAGNOSIS — G8929 Other chronic pain: Secondary | ICD-10-CM | POA: Diagnosis present

## 2023-05-06 DIAGNOSIS — R2689 Other abnormalities of gait and mobility: Secondary | ICD-10-CM | POA: Diagnosis present

## 2023-05-06 DIAGNOSIS — M79622 Pain in left upper arm: Secondary | ICD-10-CM | POA: Diagnosis present

## 2023-05-06 DIAGNOSIS — M6281 Muscle weakness (generalized): Secondary | ICD-10-CM | POA: Insufficient documentation

## 2023-05-06 DIAGNOSIS — M25562 Pain in left knee: Secondary | ICD-10-CM | POA: Diagnosis present

## 2023-05-06 NOTE — Therapy (Signed)
OUTPATIENT PHYSICAL THERAPY CERVICAL/THORACIC TREATMENT   Patient Name: Raymond [name] MRN: [id number] DOB:1965/08/08, 57 y.o., male Today's Date: 05/06/2023  END OF SESSION:  PT End of Session - 05/06/23 1413     Visit Number 3    Date for PT Re-Evaluation 06/16/23    PT Start Time 1230    PT Stop Time 1310    PT Time Calculation (min) 40 min    Equipment Utilized During Treatment Gait belt    Activity Tolerance Patient tolerated treatment well    Behavior During Therapy WFL for tasks assessed/performed            Past Medical History:  Diagnosis Date   Anxiety    COPD (chronic obstructive pulmonary disease) (HCC)    Diabetes mellitus without complication (HCC)    Diverticulosis    with hx of LGIB   Edema    GERD (gastroesophageal reflux disease)    GIB (gastrointestinal bleeding)    Gout    Heavy smoker    Hyperlipidemia    Hypertension    Iron deficiency anemia    Sleep apnea    uses a cpap   Past Surgical History:  Procedure Laterality Date   CHONDROPLASTY Left 06/28/2014   Procedure: CHONDROPLASTY;  Surgeon: Thera Flake., MD;  Location: [facility];  Service: Orthopedics;  Laterality: Left;   COLONOSCOPY     FOOT FASCIOTOMY     age 22-rt   KNEE ARTHROSCOPY WITH LATERAL MENISECTOMY Left 06/28/2014   Procedure: KNEE ARTHROSCOPY WITH LATERAL MENISECTOMY;  Surgeon: Thera Flake., MD;  Location: [facility];  Service: Orthopedics;  Laterality: Left;   KNEE ARTHROSCOPY WITH MEDIAL MENISECTOMY Left 06/28/2014   Procedure: LEFT KNEE ARTHROSCOPY CHONDROPLASTY/WITH MEDIAL/LATERAL MENISECTOMIES;  Surgeon: Thera Flake., MD;  Location: [facility];  Service: Orthopedics;  Laterality: Left;   ORIF RADIUS & ULNA FRACTURES  2007   left   THORACIC DISCECTOMY N/A 03/12/2023   Procedure: THORACIC LAMINECTOMY AND  DISCECTOMY;  Surgeon: Coletta Memos, MD;  Location: [facility] OR;  Service: Neurosurgery;  Laterality: N/A;   Patient Active  Problem List   Diagnosis Date Noted   Acute on chronic anemia 04/13/2023   Acute kidney injury superimposed on chronic kidney disease (HCC) 04/13/2023   GERD (gastroesophageal reflux disease) 04/13/2023   Constipation 04/13/2023   Chronic back pain 04/13/2023   DVT, bilateral lower limbs (HCC) 04/13/2023   Coping style affecting medical condition 03/27/2023   Thoracic myelopathy 03/23/2023   HNP (herniated nucleus pulposus with myelopathy), thoracic 03/12/2023   Hyperlipidemia 12/24/2022   Failure to thrive in adult 12/24/2022   Tobacco abuse 03/18/2018   BPH associated with nocturia 03/11/2017   History of substance abuse (HCC) 03/11/2017   Restless leg syndrome 03/11/2017   Restrictive lung disease 05/14/2016   OSA on CPAP 05/14/2016   Moderate COPD (chronic obstructive pulmonary disease) (HCC) 05/14/2016   Smoking greater than 40 pack years 05/14/2016   SOB (shortness of breath) 10/06/2014   Edema of extremities 10/06/2014   Benign essential HTN 10/06/2014   Type 2 diabetes mellitus, without long-term current use of insulin (HCC) 07/18/2011    PCP: Venetia Constable, MD  REFERRING PROVIDER:  Charlton Amor, PA-C   REFERRING DIAG: [phone] (ICD-10-CM) - Other spondylosis with myelopathy, thoracic region   THERAPY DIAG:  Muscle weakness (generalized)  Stiffness of right knee, not elsewhere classified  Stiffness of left knee, not elsewhere classified  Chronic pain  of left knee  Chronic pain of right knee  Unsteadiness on feet  Rationale for Evaluation and Treatment: Rehabilitation  ONSET DATE: October 2024  SUBJECTIVE:                                                                                                                                                                                                         SUBJECTIVE STATEMENT: Pt reports that he has 1 more injection, but his knees are feeling better.  From eval: Pt states he fell, broke his  ankle and was found to later have a ruptured disc and had to get back surgery. Went to inpatient rehab x 3 weeks. Felt like he built up his upper body. Has now been home x 1 week. States he has not been doing much since then -- has done some sitting exercises. Was able to stand for 3 min bouts for a total of about 12 min using RW at inpatient rehab. Has history of bad knees and will be getting gel shots. 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 He was found to have large HNP T10/T11 and underwent T10 laminectomy with discectomy by Dr. Franky Macho on 03/12/2023   PAIN:  Are you having pain? No  PRECAUTIONS: None  RED FLAGS: None     WEIGHT BEARING RESTRICTIONS: No  FALLS:  Has patient fallen in last 6 months? Yes. Number of falls 1 -- last fall Oct 2024  LIVING ENVIRONMENT: Lives with: lives with their spouse Lives in: House/apartment Stairs:  ramp Has following equipment at home: Environmental consultant - 2 wheeled, Wheelchair (manual), and upright walker, hospital bed  OCCUPATION: Retired  PLOF:  Able to do his own ADLs but easier to have wife assist him  PATIENT GOALS: Walk with the walker at least household distances  NEXT MD VISIT: 04/21/23 [place]  OBJECTIVE:  Note: Objective measures were completed at Evaluation unless otherwise noted.  DIAGNOSTIC FINDINGS:  03/12/23 MRI THORACIC SPINE IMPRESSION:   1. Moderate-sized lobulated left subarticular to foraminal disc protrusion at T10-11 with resultant severe spinal stenosis. Cord is compressed and deviated to the right at this level. Associated cord signal changes concerning for edema and/or myelomalacia. 2. No other acute abnormality within the thoracic  spine. 3. Degenerative spondylosis at T8-9 through T11-12 without significant spinal stenosis. Associated  moderate to severe bilateral foraminal narrowing at these levels as above.  MRI LUMBAR SPINE IMPRESSION:   1. No acute abnormality within the lumbar spine. 2. Multifactorial degenerative changes at L4-5 with resultant mild canal with severe left and moderate right lateral recess stenosis, with severe bilateral L4 foraminal narrowing. 3. Additional mild noncompressive disc bulging and facet hypertrophy elsewhere within the lumbar spine as above. No other significant stenosis or frank neural impingement.   Critical Value/emergent results were called by telephone at the time of interpretation on 03/12/2023 at 12:41 am to provider Dr. Madilyn Hook, who verbally acknowledged these results.  PATIENT SURVEYS:  FOTO 42; predicted 74  COGNITION: Overall cognitive status: Within functional limits for tasks assessed  SENSATION: WFL  POSTURE: rounded shoulders and forward head  PALPATION: No overt tenderness to palpation   CERVICAL ROM: WNL  LOWER EXTREMITY MMT:    MMT Right eval Left eval  Hip flexion 3+ 3  Hip extension 3+ 3+  Hip abduction 3+ 3+  Hip adduction    Hip internal rotation    Hip external rotation    Knee flexion 4- 3+  Knee extension 4- 3+  Ankle dorsiflexion    Ankle plantarflexion    Ankle inversion    Ankle eversion     (Blank rows = not tested)   FUNCTIONAL TESTS:  Transfers:  Chair to bed: Multiple seated scoots with UEs  Sit<>stand: max A -- limited due to knee pain Supine to sit<>sit: min A for trunk and LE negotiation. Not performing log rolling  TODAY'S TREATMENT:                                                                                                                              DATE:  05/06/23 Lateral transfers from W/C <> mat with min A. Sit to supine with light min A to BLE Rolled to L- Therapist facilitated lower trunk active  mobilization in ant/post and inf/sup planes of movement. Moved to R LE with acitve hip abd, very weak, tends to turn and use quads, clamshell also required mod A. Facilitated repeated rolling using normalized technique with RLE and UE held in the air to engage trunk. Very challenging, but able to complete x 4. Repeated all activities in R side lying. Moved from Sup > long sit I, but did report some strain in back Sit <> partial stand from elevated mat, hands on the seat or armrests of a chair facing him, required +2 to initiate due to B knee pain, but he was able to rise into bent over standing position x5 and hold for up to 5 seconds. B scoot transfers, emphasizing lifting hips off the surface by pushing through legs, x 5 each way on mat Transfer back to W/C with CGA. Mat > W/C with CGA  04/23/23 Seated heel slide 2x10 Transfer x2; min A to stabilize w/c while getting to bed. Pt utilizing multiple scoots and UEs SLR short range x10 (>  10 deg extensor lag bilat) Quad set 2x10 SAQ 2# ankle weight 2x10 Supine clamshell green TB 2x10 Bridge 2x10 Supine to sit log roll with SBA for v/cs In // bars pulling up for 1/4 to standing 2x5  In w/c pushing with legs only 2x10' In w/c pulling with legs only 2x10'  04/21/23 See HEP below   PATIENT EDUCATION:  Education details: Exam findings, POC, initial HEP Person educated: Patient Education method: Explanation, Demonstration, and Handouts Education comprehension: verbalized understanding, returned demonstration, and needs further education  HOME EXERCISE PROGRAM: Access Code: [email or web address] URL: [email or web address] Date: 04/21/2023 Prepared by: Vernon Prey April Kirstie Peri  Exercises - Staggered Bridge  - 1 x daily - 7 x weekly - 2 sets - 10 reps - Hooklying Isometric Clamshell  - 1 x daily - 7 x weekly - 2 sets - 10 reps - Small Range Straight Leg Raise  - 1 x daily - 7 x weekly - 2 sets - 10 reps  ASSESSMENT:  CLINICAL  IMPRESSION: Treatment focused on trunk activation to improve his stability, then facilitated LE strength in functional movements. He participated well. His knee pain does limit his use of LE's, but improved since he started with the gel injections. Strongly encouraged him to perform the strengthening exercises at home.  From eval: Patient is a 57 y.o. M who was seen today for physical therapy evaluation and treatment for thoracic myelopathy. PMH significant for bilat knee OA (pt to get gel shots and is interested in future TKA once he gets his weight down) and T10 laminectomy with discectomy by Dr. Franky Macho on 03/12/2023. Went to inpatient rehab x 3 weeks. Has now been at home x 1 week. Pt states he does not currently have any known precautions. Assessment significant for gross bilat LE weakness (L LE weaker than R) affecting standing tolerance, transfers and balance. Pt is limited due to his knee pain. Pt will highly benefit from PT to address these deficits to reach pt's goals for increased mobility and amb.   OBJECTIVE IMPAIRMENTS: Abnormal gait, decreased balance, decreased endurance, decreased mobility, difficulty walking, decreased ROM, decreased strength, increased edema, improper body mechanics, postural dysfunction, obesity, and pain.   GOALS: Goals reviewed with patient? Yes  SHORT TERM GOALS: Target date: 06/02/2023   Pt will be ind with initial HEP Baseline:  Goal status: 05/06/23- provided, but inconsistent in performing. Ongoing  2.  Pt will be able to tolerate standing x 5 min to brush his teeth Baseline:  Goal status: INITIAL  3.  Pt will be able to perform stand pivot t/f with RW min a Baseline:  Goal status: 05/06/23- scooting transfers with min a. Ongoing   LONG TERM GOALS: Target date: 07/14/2023   Pt will be ind with management and progression of HEP Baseline:  Goal status: INITIAL  2.  Pt will be able to amb at least 20' with RW min A for home mobility Baseline:   Goal status: INITIAL  3.  Pt will be able to perform 5x STS to demo increased functional LE strength Baseline: Unable to perform complete STS without max A Goal status: INITIAL  4.  Pt will have increased FOTO to >/=54 Baseline:  Goal status: INITIAL  PLAN:  PT FREQUENCY: 2x/week  PT DURATION: 12 weeks  PLANNED INTERVENTIONS: 97164- PT Re-evaluation, 97110-Therapeutic exercises, 97530- Therapeutic activity, [id number]- Neuromuscular re-education, 97535- Self Care, 19147- Manual therapy, [id number]- Gait training, [phone]- Orthotic Fit/training, [id number]- Aquatic Therapy, 97014- Electrical stimulation (unattended), [id number]- Ultrasound, [id number]- Ionotophoresis 4mg /ml  Dexamethasone, Patient/Family education, Balance training, Stair training, Taping, Dry Needling, Joint mobilization, Spinal mobilization, Cryotherapy, and Moist heat  PLAN FOR NEXT SESSION: Assess response to HEP. Continue to work on LE strengthening and transfers/standing.    Iona Beard, PT 05/06/2023, 2:14 PM

## 2023-05-06 NOTE — Therapy (Signed)
OUTPATIENT OCCUPATIONAL THERAPY NEURO Treatment  Patient Name: Raymond [name] MRN: [id number] DOB:1965-09-12, 57 y.o., male Today's Date: 05/06/2023  PCP: Dr. Tawanna Solo PROVIDER: Dr. Carlis Abbott  END OF SESSION:  OT End of Session - 05/06/23 1300     Visit Number 2    Authorization Time Period 12 weeks    OT Start Time 1315    OT Stop Time 1400    OT Time Calculation (min) 45 min    Activity Tolerance Patient tolerated treatment well    Behavior During Therapy WFL for tasks assessed/performed              Past Medical History:  Diagnosis Date   Anxiety    COPD (chronic obstructive pulmonary disease) (HCC)    Diabetes mellitus without complication (HCC)    Diverticulosis    with hx of LGIB   Edema    GERD (gastroesophageal reflux disease)    GIB (gastrointestinal bleeding)    Gout    Heavy smoker    Hyperlipidemia    Hypertension    Iron deficiency anemia    Sleep apnea    uses a cpap   Past Surgical History:  Procedure Laterality Date   CHONDROPLASTY Left 06/28/2014   Procedure: CHONDROPLASTY;  Surgeon: Thera Flake., MD;  Location: [facility];  Service: Orthopedics;  Laterality: Left;   COLONOSCOPY     FOOT FASCIOTOMY     age 70-rt   KNEE ARTHROSCOPY WITH LATERAL MENISECTOMY Left 06/28/2014   Procedure: KNEE ARTHROSCOPY WITH LATERAL MENISECTOMY;  Surgeon: Thera Flake., MD;  Location: [facility];  Service: Orthopedics;  Laterality: Left;   KNEE ARTHROSCOPY WITH MEDIAL MENISECTOMY Left 06/28/2014   Procedure: LEFT KNEE ARTHROSCOPY CHONDROPLASTY/WITH MEDIAL/LATERAL MENISECTOMIES;  Surgeon: Thera Flake., MD;  Location: [facility];  Service: Orthopedics;  Laterality: Left;   ORIF RADIUS & ULNA FRACTURES  2007   left   THORACIC DISCECTOMY N/A 03/12/2023   Procedure: THORACIC LAMINECTOMY AND  DISCECTOMY;  Surgeon: Coletta Memos, MD;  Location: [facility];  Service: Neurosurgery;  Laterality: N/A;   Patient Active  Problem List   Diagnosis Date Noted   Acute on chronic anemia 04/13/2023   Acute kidney injury superimposed on chronic kidney disease (HCC) 04/13/2023   GERD (gastroesophageal reflux disease) 04/13/2023   Constipation 04/13/2023   Chronic back pain 04/13/2023   DVT, bilateral lower limbs (HCC) 04/13/2023   Coping style affecting medical condition 03/27/2023   Thoracic myelopathy 03/23/2023   HNP (herniated nucleus pulposus with myelopathy), thoracic 03/12/2023   Hyperlipidemia 12/24/2022   Failure to thrive in adult 12/24/2022   Tobacco abuse 03/18/2018   BPH associated with nocturia 03/11/2017   History of substance abuse (HCC) 03/11/2017   Restless leg syndrome 03/11/2017   Restrictive lung disease 05/14/2016   OSA on CPAP 05/14/2016   Moderate COPD (chronic obstructive pulmonary disease) (HCC) 05/14/2016   Smoking greater than 40 pack years 05/14/2016   SOB (shortness of breath) 10/06/2014   Edema of extremities 10/06/2014   Benign essential HTN 10/06/2014   Type 2 diabetes mellitus, without long-term current use of insulin (HCC) 07/18/2011    ONSET DATE: 04/08/23- referral date  REFERRING DIAG:  Diagnosis  M47.14 (ICD-10-CM) - Other spondylosis with myelopathy, thoracic region    THERAPY DIAG:  Muscle weakness (generalized)  Other abnormalities of gait and mobility  Unsteadiness on feet  Rationale for Evaluation and Treatment: Rehabilitation  SUBJECTIVE:   SUBJECTIVE STATEMENT:  Pt reports he had a nice Thanksgiving Pt accompanied by: self  PERTINENT HISTORY: 57 y.o. male presenting with increased low back pain after a fall. Pt was found to have thoracic myelopathy due to a disc herniation at T10/11. He is now s/p T10 laminectomy and Discectomy. Pt received therapies at [facility] and he d/c home 03/23/23 PMH of anxiety, COPD, DM, Edema, Gout, HTN, HLD, L medial menisectomy, ORIF L radius and ulna.  PRECAUTIONS: Back  WEIGHT BEARING RESTRICTIONS: No  PAIN:  Are  you having pain? Yes: NPRS scale: 3-4/10 Pain location: back Pain description: aching Aggravating factors: sitting still Relieving factors: meds  FALLS: Has patient fallen in last 6 months? Yes. Number of falls 1  LIVING ENVIRONMENT: Lives with: lives with their spouse Lives in: House/apartment Stairs:  has ramp Has following equipment at home: Walker - 2 wheeled and bed side commode  PLOF: Independent with basic ADLs  PATIENT GOALS: increase I with ADLS/ADLS  OBJECTIVE:  Note: Objective measures were completed at Evaluation unless otherwise noted.  HAND DOMINANCE: Right  ADLs: Overall ADLs: increased time required, uses w/c, pt is not able to ambulate or stand for ADLs at this time Transfers/ambulation related to ADLs: Eating: mod I Grooming: mod I UB Dressing: setup LB Dressing: mod A,  Toileting: uses bedside commode supervision-min A for scooting Bathing: sponge bathing, min A per pt report Tub Shower transfers: n/a  Equipment: bed side commode Unable to get into bathroom with w/c  IADLs: dependent with IADLS   MOBILITY STATUS:  supervision to min A for transfers  -scooting from w/c, pt is unable to stand for ADLs  Activitiy tolerance: 45 mins to hour with rest break    UPPER EXTREMITY ROM:    Active ROM Right eval Left eval  Shoulder flexion 90 110  Shoulder abduction 90 100  Shoulder adduction    Shoulder extension    Shoulder internal rotation    Shoulder external rotation    Elbow flexion [facility] WFL  Elbow extension [facility] WFL  Wrist flexion    Wrist extension    Wrist ulnar deviation    Wrist radial deviation    Wrist pronation    Wrist supination  90%  (Blank rows = not tested)  UPPER EXTREMITY MMT:     MMT Right eval Left eval  Shoulder flexion 3+/5 4/5  Shoulder abduction    Shoulder adduction    Shoulder extension    Shoulder internal rotation    Shoulder external rotation    Middle trapezius    Lower trapezius    Elbow flexion 4/5  4+/5  Elbow extension 4//5 4+/5  Wrist flexion    Wrist extension    Wrist ulnar deviation    Wrist radial deviation    Wrist pronation    Wrist supination    (Blank rows = not tested)  HAND FUNCTION: Grip strength: Right: 72 lbs; Left: 75 lbs  COORDINATION: 9 Hole Peg test: Right: 23.21 sec; Left: 32.52 sec  SENSATION: WFL      COGNITION: Overall cognitive status: Within functional limits for tasks assessed    OBSERVATIONS: Pleasant male is highly motivated to improve   TODAY'S TREATMENT:  DATE: 05/06/23- UBE x 6 mins level 2 for conditioning, seated in w/c Pt transferred scoot pivot without sliding board to mat with supervision Seated on mat closed chain shoulder flexion and biceps curls 10 reps each, min v.c  Biceps curls x 20 reps with 3lbs weight bilateral UE's, min v.c shoulder flexion bilateal UE's with 2 lbs weight in each hand, 15 reps. Pt practiced scooting on mat with cueing to shift weight fowrard and pushdown through hands to scoot, pt also practiced using this technique for a sliding board transfer back to w/c. min v.c Pt's wife rports pt an't go into the kitchen because he can't remove his leg rests. Pt practiced removing and reattaching w/c leg rests with min v.c the pt practiced walking his w/c forwards with legs while assisting aby pushing wheels. see education 04/22/23 eval  PATIENT EDUCATION: Education details: information regarding TED hose donner, handout was printed for pt/ wife, sliding board transfers Person educated: Patient Education method: Explanation and Verbal cues Education comprehension: verbalized understanding  HOME EXERCISE PROGRAM: n/a   GOALS: Goals reviewed with patient? Yes  SHORT TERM GOALS: Target date: 05/20/23  I with inital HEP.  Goal status: INITIAL  2.  Pt will pefrom LB dressing with min  A  Goal status: INITIAL  3.  Pt will consistently perform toiltet transfers with supervision.  Goal status: INITIAL  4.  Pt will perform snack or beverage prep at a w/c level  Goal status: INITIAL  5. Pt will verbalize understanding of back precautions.  Goal status: inital    LONG TERM GOALS: Target date: 07/15/23  I with updated HEP. Baseline:  Goal status: INITIAL  2.  Pt will perform bathing with supervision/ set up  Goal status: INITIAL  3.  Pt will perform LB dressing with supervision/ set up.  Goal status: INITIAL  4.  Pt will demonstrate ability with stand for 5 mins with min A for ADLs/ IADLs.  Goal status: INITIAL  5.  Pt will perfrom simple home management at a walker level with min A.  Goal status: INITIAL   ASSESSMENT: Pt is progress ing towards goals. Pt demonstrates improved transfer back to w/c following practice and v.c for techniques. Pt's wife was present.  CLINICAL IMPRESSION: Pt is progressing towards. He demonstrates understanding of intial HEP. PERFORMANCE DEFICITS: in functional skills including ADLs, IADLs, ROM, strength, pain, flexibility, mobility, balance, body mechanics, endurance, decreased knowledge of precautions, decreased knowledge of use of DME, and UE functional use,  and psychosocial skills including coping strategies, environmental adaptation, habits, interpersonal interactions, and routines and behaviors.   IMPAIRMENTS: are limiting patient from ADLs, IADLs, rest and sleep, work, play, leisure, and social participation.   CO-MORBIDITIES: Diamant have co-morbidities  that affects occupational performance. Patient will benefit from skilled OT to address above impairments and improve overall function.  MODIFICATION OR ASSISTANCE TO COMPLETE EVALUATION: No modification of tasks or assist necessary to complete an evaluation.  OT OCCUPATIONAL PROFILE AND HISTORY: Detailed assessment: Review of records and additional review of physical,  cognitive, psychosocial history related to current functional performance.  CLINICAL DECISION MAKING: LOW - limited treatment options, no task modification necessary  REHAB POTENTIAL: Good  EVALUATION COMPLEXITY: Low    PLAN:  OT FREQUENCY: 2x/week  OT DURATION: 12 weeks plus eval  PLANNED INTERVENTIONS: 97168 OT Re-evaluation, 97535 self care/ADL training, 29562 therapeutic exercise, 97530 therapeutic activity, 97112 neuromuscular re-education, 97140 manual therapy, 97116 gait training, 13086 aquatic therapy, 97035 ultrasound, 97018 paraffin, 57846 moist  heat, 97010 cryotherapy, passive range of motion, balance training, stair training, functional mobility training, psychosocial skills training, energy conservation, coping strategies training, patient/family education, and DME and/or AE instructions  RECOMMENDED OTHER SERVICES: PT  CONSULTED AND AGREED WITH PLAN OF CARE: Patient  PLAN FOR NEXT SESSION:  continue with ADL strategies, transfers.   Savayah Waltrip, OT 05/06/2023, 1:01 PM

## 2023-05-07 ENCOUNTER — Encounter: Payer: Self-pay | Admitting: Physical Therapy

## 2023-05-07 ENCOUNTER — Ambulatory Visit: Payer: Managed Care, Other (non HMO) | Admitting: Physical Therapy

## 2023-05-07 ENCOUNTER — Ambulatory Visit: Payer: Managed Care, Other (non HMO) | Admitting: Occupational Therapy

## 2023-05-07 DIAGNOSIS — M25662 Stiffness of left knee, not elsewhere classified: Secondary | ICD-10-CM

## 2023-05-07 DIAGNOSIS — M6281 Muscle weakness (generalized): Secondary | ICD-10-CM | POA: Diagnosis not present

## 2023-05-07 DIAGNOSIS — G8929 Other chronic pain: Secondary | ICD-10-CM

## 2023-05-07 DIAGNOSIS — R2681 Unsteadiness on feet: Secondary | ICD-10-CM

## 2023-05-07 DIAGNOSIS — R29898 Other symptoms and signs involving the musculoskeletal system: Secondary | ICD-10-CM

## 2023-05-07 DIAGNOSIS — R2689 Other abnormalities of gait and mobility: Secondary | ICD-10-CM

## 2023-05-07 DIAGNOSIS — M25661 Stiffness of right knee, not elsewhere classified: Secondary | ICD-10-CM

## 2023-05-07 NOTE — Patient Instructions (Addendum)
    Elbow Flexion: Resisted   With tubing held in ____one__ hand(s) and other end secured under foot, curl arm up as far as possible. Repeat _10___ times per set. Do _1-2___ sessions per day, every other day.    Elbow Extension: Resisted   Sit in chair with resistive band secured at armrest (or hold with other hand) and _one______ elbow bent. Straighten elbow. Repeat _10___ times per set.  Do _1-2___ sessions per day, every other day.    Copyright  VHI. All rights reserved.      SELF ASSISTED WITH OBJECT: Shoulder Flexion / Elbow Extension (Crutch)    Place one hand on crutch or other assistive device. Move arm forward, straighten elbow. _10__ reps per set, __1-2_ sets per day, __7_ days per week Lower crutch if shoulder range of motion is limited. After performing forwards and back wards, then progress to circles 10 reps each arm  Copyright  VHI. All rights reserved.  Triceps Dip    Make sure that breaks are locked, feeet on floor, hands on armrests, extend arms to lift buttocks from chair. Hold _3__ seconds. Repeat _10__ times. Do __1-2_ sessions per day. STOP if pain  Copyright  VHI. All rights reserved.

## 2023-05-07 NOTE — Therapy (Signed)
OUTPATIENT OCCUPATIONAL THERAPY NEURO Treatment  Patient Name: Raymond [name] MRN: [id number] DOB:12/19/1965, 57 y.o., male Today's Date: 05/07/2023  PCP: Dr. Tawanna Solo PROVIDER: Dr. Carlis Abbott  END OF SESSION:  OT End of Session - 05/07/23 0942     Visit Number 3    Number of Visits 25    Date for OT Re-Evaluation 07/15/23    Authorization Time Period 12 weeks    OT Start Time 0930    OT Stop Time 1010    OT Time Calculation (min) 40 min              Past Medical History:  Diagnosis Date   Anxiety    COPD (chronic obstructive pulmonary disease) (HCC)    Diabetes mellitus without complication (HCC)    Diverticulosis    with hx of LGIB   Edema    GERD (gastroesophageal reflux disease)    GIB (gastrointestinal bleeding)    Gout    Heavy smoker    Hyperlipidemia    Hypertension    Iron deficiency anemia    Sleep apnea    uses a cpap   Past Surgical History:  Procedure Laterality Date   CHONDROPLASTY Left 06/28/2014   Procedure: CHONDROPLASTY;  Surgeon: Thera Flake., MD;  Location: [facility];  Service: Orthopedics;  Laterality: Left;   COLONOSCOPY     FOOT FASCIOTOMY     age 30-rt   KNEE ARTHROSCOPY WITH LATERAL MENISECTOMY Left 06/28/2014   Procedure: KNEE ARTHROSCOPY WITH LATERAL MENISECTOMY;  Surgeon: Thera Flake., MD;  Location: [facility];  Service: Orthopedics;  Laterality: Left;   KNEE ARTHROSCOPY WITH MEDIAL MENISECTOMY Left 06/28/2014   Procedure: LEFT KNEE ARTHROSCOPY CHONDROPLASTY/WITH MEDIAL/LATERAL MENISECTOMIES;  Surgeon: Thera Flake., MD;  Location: [facility];  Service: Orthopedics;  Laterality: Left;   ORIF RADIUS & ULNA FRACTURES  2007   left   THORACIC DISCECTOMY N/A 03/12/2023   Procedure: THORACIC LAMINECTOMY AND  DISCECTOMY;  Surgeon: Coletta Memos, MD;  Location: [facility] OR;  Service: Neurosurgery;  Laterality: N/A;   Patient Active Problem List   Diagnosis Date Noted   Acute on chronic  anemia 04/13/2023   Acute kidney injury superimposed on chronic kidney disease (HCC) 04/13/2023   GERD (gastroesophageal reflux disease) 04/13/2023   Constipation 04/13/2023   Chronic back pain 04/13/2023   DVT, bilateral lower limbs (HCC) 04/13/2023   Coping style affecting medical condition 03/27/2023   Thoracic myelopathy 03/23/2023   HNP (herniated nucleus pulposus with myelopathy), thoracic 03/12/2023   Hyperlipidemia 12/24/2022   Failure to thrive in adult 12/24/2022   Tobacco abuse 03/18/2018   BPH associated with nocturia 03/11/2017   History of substance abuse (HCC) 03/11/2017   Restless leg syndrome 03/11/2017   Restrictive lung disease 05/14/2016   OSA on CPAP 05/14/2016   Moderate COPD (chronic obstructive pulmonary disease) (HCC) 05/14/2016   Smoking greater than 40 pack years 05/14/2016   SOB (shortness of breath) 10/06/2014   Edema of extremities 10/06/2014   Benign essential HTN 10/06/2014   Type 2 diabetes mellitus, without long-term current use of insulin (HCC) 07/18/2011    ONSET DATE: 04/08/23- referral date  REFERRING DIAG:  Diagnosis  M47.14 (ICD-10-CM) - Other spondylosis with myelopathy, thoracic region    THERAPY DIAG:  No diagnosis found.  Rationale for Evaluation and Treatment: Rehabilitation  SUBJECTIVE:   SUBJECTIVE STATEMENT: Pt reports mild back pain Pt accompanied by: self  PERTINENT HISTORY: 57 y.o. male  presenting with increased low back pain after a fall. Pt was found to have thoracic myelopathy due to a disc herniation at T10/11. He is now s/p T10 laminectomy and Discectomy. Pt received therapies at [facility] and he d/c home 03/23/23 PMH of anxiety, COPD, DM, Edema, Gout, HTN, HLD, L medial menisectomy, ORIF L radius and ulna.  PRECAUTIONS: Back  WEIGHT BEARING RESTRICTIONS: No  PAIN:  Are you having pain? Yes: NPRS scale: 3-4/10 Pain location: back Pain description: aching Aggravating factors: sitting still Relieving factors:  meds  FALLS: Has patient fallen in last 6 months? Yes. Number of falls 1  LIVING ENVIRONMENT: Lives with: lives with their spouse Lives in: House/apartment Stairs:  has ramp Has following equipment at home: Walker - 2 wheeled and bed side commode  PLOF: Independent with basic ADLs  PATIENT GOALS: increase I with ADLS/ADLS  OBJECTIVE:  Note: Objective measures were completed at Evaluation unless otherwise noted.  HAND DOMINANCE: Right  ADLs: Overall ADLs: increased time required, uses w/c, pt is not able to ambulate or stand for ADLs at this time Transfers/ambulation related to ADLs: Eating: mod I Grooming: mod I UB Dressing: setup LB Dressing: mod A,  Toileting: uses bedside commode supervision-min A for scooting Bathing: sponge bathing, min A per pt report Tub Shower transfers: n/a  Equipment: bed side commode Unable to get into bathroom with w/c  IADLs: dependent with IADLS   MOBILITY STATUS:  supervision to min A for transfers  -scooting from w/c, pt is unable to stand for ADLs  Activitiy tolerance: 45 mins to hour with rest break    UPPER EXTREMITY ROM:    Active ROM Right eval Left eval  Shoulder flexion 90 110  Shoulder abduction 90 100  Shoulder adduction    Shoulder extension    Shoulder internal rotation    Shoulder external rotation    Elbow flexion [facility] WFL  Elbow extension [facility] WFL  Wrist flexion    Wrist extension    Wrist ulnar deviation    Wrist radial deviation    Wrist pronation    Wrist supination  90%  (Blank rows = not tested)  UPPER EXTREMITY MMT:     MMT Right eval Left eval  Shoulder flexion 3+/5 4/5  Shoulder abduction    Shoulder adduction    Shoulder extension    Shoulder internal rotation    Shoulder external rotation    Middle trapezius    Lower trapezius    Elbow flexion 4/5 4+/5  Elbow extension 4//5 4+/5  Wrist flexion    Wrist extension    Wrist ulnar deviation    Wrist radial deviation    Wrist pronation     Wrist supination    (Blank rows = not tested)  HAND FUNCTION: Grip strength: Right: 72 lbs; Left: 75 lbs  COORDINATION: 9 Hole Peg test: Right: 23.21 sec; Left: 32.52 sec  SENSATION: WFL      COGNITION: Overall cognitive status: Within functional limits for tasks assessed    OBSERVATIONS: Pleasant male is highly motivated to improve   TODAY'S TREATMENT:  DATE: 05/07/23 UBE x 6 mins level 3 for conditioning Pt was instructed in updates to HEP, for bilateral UE's min v.c and demonstration 10 reps each, red band and cane were used see pt instuctions.  05/06/23- UBE x 6 mins level 2 for conditioning, seated in w/c Pt transferred scoot pivot without sliding board to mat with supervision Seated on mat closed chain shoulder flexion and biceps curls 10 reps each, min v.c  Biceps curls x 20 reps with 3lbs weight bilateral UE's, min v.c shoulder flexion bilateal UE's with 2 lbs weight in each hand, 15 reps. Pt practiced scooting on mat with cueing to shift weight fowrard and pushdown through hands to scoot, pt also practiced using this technique for a sliding board transfer back to w/c. min v.c Pt's wife rports pt an't go into the kitchen because he can't remove his leg rests. Pt practiced removing and reattaching w/c leg rests with min v.c the pt practiced walking his w/c forwards with legs while assisting aby pushing wheels. see education 04/22/23 eval  PATIENT EDUCATION: Education details:  updated HEP, see pt instructions Person educated: Patient, wife Education method: Explanation and Verbal cues, min v.c Education comprehension: verbalized understanding, returned demonstration, v.c  HOME EXERCISE PROGRAM:  red band and vertical cane 05/07/23   GOALS: Goals reviewed with patient? Yes  SHORT TERM GOALS: Target date: 05/20/23  I with inital  HEP.  Goal status: INITIAL  2.  Pt will pefrom LB dressing with min A  Goal status: INITIAL  3.  Pt will consistently perform toiltet transfers with supervision.  Goal status: INITIAL  4.  Pt will perform snack or beverage prep at a w/c level  Goal status: INITIAL  5. Pt will verbalize understanding of back precautions.  Goal status: inital    LONG TERM GOALS: Target date: 07/15/23  I with updated HEP. Baseline:  Goal status: INITIAL  2.  Pt will perform bathing with supervision/ set up  Goal status: INITIAL  3.  Pt will perform LB dressing with supervision/ set up.  Goal status: INITIAL  4.  Pt will demonstrate ability with stand for 5 mins with min A for ADLs/ IADLs.  Goal status: INITIAL  5.  Pt will perfrom simple home management at a walker level with min A.  Goal status: INITIAL   ASSESSMENT: Pt is progress ing towards goals. Pt demonstrates improved transfer back to w/c following practice and v.c for techniques. Pt's wife was present.  CLINICAL IMPRESSION: Pt is progressing towards. He demonstrates understanding of intial HEP. PERFORMANCE DEFICITS: in functional skills including ADLs, IADLs, ROM, strength, pain, flexibility, mobility, balance, body mechanics, endurance, decreased knowledge of precautions, decreased knowledge of use of DME, and UE functional use,  and psychosocial skills including coping strategies, environmental adaptation, habits, interpersonal interactions, and routines and behaviors.   IMPAIRMENTS: are limiting patient from ADLs, IADLs, rest and sleep, work, play, leisure, and social participation.   CO-MORBIDITIES: Shuster have co-morbidities  that affects occupational performance. Patient will benefit from skilled OT to address above impairments and improve overall function.  MODIFICATION OR ASSISTANCE TO COMPLETE EVALUATION: No modification of tasks or assist necessary to complete an evaluation.  OT OCCUPATIONAL PROFILE AND HISTORY:  Detailed assessment: Review of records and additional review of physical, cognitive, psychosocial history related to current functional performance.  CLINICAL DECISION MAKING: LOW - limited treatment options, no task modification necessary  REHAB POTENTIAL: Good  EVALUATION COMPLEXITY: Low    PLAN:  OT FREQUENCY: 2x/week  OT DURATION: 12 weeks plus eval  PLANNED INTERVENTIONS: 97168 OT Re-evaluation, 97535 self care/ADL training, 97110 therapeutic exercise, 97530 therapeutic activity, 97112 neuromuscular re-education, 97140 manual therapy, 97116 gait training, [id number] aquatic therapy, 97035 ultrasound, 97018 paraffin, 97010 moist heat, 97010 cryotherapy, passive range of motion, balance training, stair training, functional mobility training, psychosocial skills training, energy conservation, coping strategies training, patient/family education, and DME and/or AE instructions  RECOMMENDED OTHER SERVICES: PT  CONSULTED AND AGREED WITH PLAN OF CARE: Patient  PLAN FOR NEXT SESSION:  continue with ADL strategies, transfers.   Gamble Enderle, OT 05/07/2023, 9:44 AM

## 2023-05-07 NOTE — Therapy (Signed)
OUTPATIENT PHYSICAL THERAPY CERVICAL/THORACIC TREATMENT   Patient Name: Raymond [name] MRN: [id number] DOB:1966-02-07, 58 y.o., male Today's Date: 05/07/2023  END OF SESSION:  PT End of Session - 05/07/23 0942     Visit Number 4    Date for PT Re-Evaluation 06/16/23    PT Start Time 0847    PT Stop Time 0928    PT Time Calculation (min) 41 min    Equipment Utilized During Treatment Gait belt    Activity Tolerance Patient tolerated treatment well    Behavior During Therapy WFL for tasks assessed/performed             Past Medical History:  Diagnosis Date   Anxiety    COPD (chronic obstructive pulmonary disease) (HCC)    Diabetes mellitus without complication (HCC)    Diverticulosis    with hx of LGIB   Edema    GERD (gastroesophageal reflux disease)    GIB (gastrointestinal bleeding)    Gout    Heavy smoker    Hyperlipidemia    Hypertension    Iron deficiency anemia    Sleep apnea    uses a cpap   Past Surgical History:  Procedure Laterality Date   CHONDROPLASTY Left 06/28/2014   Procedure: CHONDROPLASTY;  Surgeon: Thera Flake., MD;  Location: [facility];  Service: Orthopedics;  Laterality: Left;   COLONOSCOPY     FOOT FASCIOTOMY     age 66-rt   KNEE ARTHROSCOPY WITH LATERAL MENISECTOMY Left 06/28/2014   Procedure: KNEE ARTHROSCOPY WITH LATERAL MENISECTOMY;  Surgeon: Thera Flake., MD;  Location: [facility];  Service: Orthopedics;  Laterality: Left;   KNEE ARTHROSCOPY WITH MEDIAL MENISECTOMY Left 06/28/2014   Procedure: LEFT KNEE ARTHROSCOPY CHONDROPLASTY/WITH MEDIAL/LATERAL MENISECTOMIES;  Surgeon: Thera Flake., MD;  Location: [facility];  Service: Orthopedics;  Laterality: Left;   ORIF RADIUS & ULNA FRACTURES  2007   left   THORACIC DISCECTOMY N/A 03/12/2023   Procedure: THORACIC LAMINECTOMY AND  DISCECTOMY;  Surgeon: Coletta Memos, MD;  Location: [facility];  Service: Neurosurgery;  Laterality: N/A;   Patient Active  Problem List   Diagnosis Date Noted   Acute on chronic anemia 04/13/2023   Acute kidney injury superimposed on chronic kidney disease (HCC) 04/13/2023   GERD (gastroesophageal reflux disease) 04/13/2023   Constipation 04/13/2023   Chronic back pain 04/13/2023   DVT, bilateral lower limbs (HCC) 04/13/2023   Coping style affecting medical condition 03/27/2023   Thoracic myelopathy 03/23/2023   HNP (herniated nucleus pulposus with myelopathy), thoracic 03/12/2023   Hyperlipidemia 12/24/2022   Failure to thrive in adult 12/24/2022   Tobacco abuse 03/18/2018   BPH associated with nocturia 03/11/2017   History of substance abuse (HCC) 03/11/2017   Restless leg syndrome 03/11/2017   Restrictive lung disease 05/14/2016   OSA on CPAP 05/14/2016   Moderate COPD (chronic obstructive pulmonary disease) (HCC) 05/14/2016   Smoking greater than 40 pack years 05/14/2016   SOB (shortness of breath) 10/06/2014   Edema of extremities 10/06/2014   Benign essential HTN 10/06/2014   Type 2 diabetes mellitus, without long-term current use of insulin (HCC) 07/18/2011    PCP: Venetia Constable, MD  REFERRING PROVIDER:  Charlton Amor, PA-C   REFERRING DIAG: [phone] (ICD-10-CM) - Other spondylosis with myelopathy, thoracic region   THERAPY DIAG:  Muscle weakness (generalized)  Unsteadiness on feet  Chronic pain of left knee  Stiffness of left knee, not elsewhere classified  Stiffness of right knee, not elsewhere classified  Chronic pain of right knee  Other symptoms and signs involving the musculoskeletal system  Rationale for Evaluation and Treatment: Rehabilitation  ONSET DATE: October 2024  SUBJECTIVE:                                                                                                                                                                                                         SUBJECTIVE STATEMENT: Pt reports that he received his last knee injection  yesterday.  From eval: Pt states he fell, broke his ankle and was found to later have a ruptured disc and had to get back surgery. Went to inpatient rehab x 3 weeks. Felt like he built up his upper body. Has now been home x 1 week. States he has not been doing much since then -- has done some sitting exercises. Was able to stand for 3 min bouts for a total of about 12 min using RW at inpatient rehab. Has history of bad knees and will be getting gel shots. 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 He was found to have large HNP T10/T11 and underwent T10 laminectomy with discectomy by Dr. Franky Macho on 03/12/2023   PAIN:  Are you having pain? No  PRECAUTIONS: None  RED FLAGS: None     WEIGHT BEARING RESTRICTIONS: No  FALLS:  Has patient fallen in last 6 months? Yes. Number of falls 1 -- last fall Oct 2024  LIVING ENVIRONMENT: Lives with: lives with their spouse Lives in: House/apartment Stairs:  ramp Has following equipment at home: Environmental consultant - 2 wheeled, Wheelchair (manual), and upright walker, hospital bed  OCCUPATION: Retired  PLOF:  Able to do his own ADLs but easier to have wife assist him  PATIENT GOALS: Walk with the walker at least household distances  NEXT MD VISIT: 04/21/23 [place]  OBJECTIVE:  Note: Objective measures were completed at Evaluation unless otherwise noted.  DIAGNOSTIC FINDINGS:  03/12/23 MRI THORACIC SPINE IMPRESSION:   1. Moderate-sized lobulated left subarticular to foraminal disc protrusion at T10-11 with resultant severe spinal stenosis. Cord is compressed and deviated to the right at this level. Associated cord signal changes concerning for edema and/or myelomalacia. 2.  No other acute abnormality within the thoracic spine. 3. Degenerative spondylosis at T8-9 through T11-12  without significant spinal stenosis. Associated moderate to severe bilateral foraminal narrowing at these levels as above.  MRI LUMBAR SPINE IMPRESSION:   1. No acute abnormality within the lumbar spine. 2. Multifactorial degenerative changes at L4-5 with resultant mild canal with severe left and moderate right lateral recess stenosis, with severe bilateral L4 foraminal narrowing. 3. Additional mild noncompressive disc bulging and facet hypertrophy elsewhere within the lumbar spine as above. No other significant stenosis or frank neural impingement.   Critical Value/emergent results were called by telephone at the time of interpretation on 03/12/2023 at 12:41 am to provider Dr. Madilyn Hook, who verbally acknowledged these results.  PATIENT SURVEYS:  FOTO 42; predicted 49  COGNITION: Overall cognitive status: Within functional limits for tasks assessed  SENSATION: WFL  POSTURE: rounded shoulders and forward head  PALPATION: No overt tenderness to palpation   CERVICAL ROM: WNL  LOWER EXTREMITY MMT:    MMT Right eval Left eval  Hip flexion 3+ 3  Hip extension 3+ 3+  Hip abduction 3+ 3+  Hip adduction    Hip internal rotation    Hip external rotation    Knee flexion 4- 3+  Knee extension 4- 3+  Ankle dorsiflexion    Ankle plantarflexion    Ankle inversion    Ankle eversion     (Blank rows = not tested)   FUNCTIONAL TESTS:  Transfers:  Chair to bed: Multiple seated scoots with UEs  Sit<>stand: max A -- limited due to knee pain Supine to sit<>sit: min A for trunk and LE negotiation. Not performing log rolling  TODAY'S TREATMENT:                                                                                                                              DATE:  05/07/23 Sliding board transfer to mat using lean technique with shoulders away from hips to initiate. He  then was able to lift his hips and scoot the rest of the way. Supine bridging x 10, able to clear hips Repeated roll to each side x 3 using trunk into flexion. Fitter press with 2 blue bands x 10 each leg, very challenging. Functional trunk and LE strengthening, seated on mat, turn to R, walking both hands away from trunk, then lifting L LE to touch heel to surface of mat. X 5 lifts to each side, very challenging. Sit<> partial stand from elevated mat with hands on chair in front of him, able to come to stand with hands on seat, then place hands on the armrests of the chair. Moved to parallel bars with Air ex pad on top of his W/C cushion to elevate him. Sit > Stand pulling bars, with mod A and time to push into full upright posture where he stood x approx 30 seconds. Relies on arms to A.  05/06/23 Lateral transfers from W/C <> mat with min A. Sit to supine with light min A to BLE Rolled to L- Therapist facilitated  lower trunk active mobilization in ant/post and inf/sup planes of movement. Moved to R LE with acitve hip abd, very weak, tends to turn and use quads, clamshell also required mod A. Facilitated repeated rolling using normalized technique with RLE and UE held in the air to engage trunk. Very challenging, but able to complete x 4. Repeated all activities in R side lying. Moved from Sup > long sit I, but did report some strain in back Sit <> partial stand from elevated mat, hands on the seat or armrests of a chair facing him, required +2 to initiate due to B knee pain, but he was able to rise into bent over standing position x5 and hold for up to 5 seconds. B scoot transfers, emphasizing lifting hips off the surface by pushing through legs, x 5 each way on mat Transfer back to W/C with CGA. Mat > W/C with CGA  04/23/23 Seated heel slide 2x10 Transfer x2; min A to stabilize w/c while getting to bed. Pt utilizing multiple scoots and UEs SLR short range x10 (>10 deg extensor lag  bilat) Quad set 2x10 SAQ 2# ankle weight 2x10 Supine clamshell green TB 2x10 Bridge 2x10 Supine to sit log roll with SBA for v/cs In // bars pulling up for 1/4 to standing 2x5  In w/c pushing with legs only 2x10' In w/c pulling with legs only 2x10'  04/21/23 See HEP below   PATIENT EDUCATION:  Education details: Exam findings, POC, initial HEP Person educated: Patient Education method: Explanation, Demonstration, and Handouts Education comprehension: verbalized understanding, returned demonstration, and needs further education  HOME EXERCISE PROGRAM: Access Code: [email or web address] URL: [email or web address] Date: 04/21/2023 Prepared by: Vernon Prey April Kirstie Peri  Exercises - Staggered Bridge  - 1 x daily - 7 x weekly - 2 sets - 10 reps - Hooklying Isometric Clamshell  - 1 x daily - 7 x weekly - 2 sets - 10 reps - Small Range Straight Leg Raise  - 1 x daily - 7 x weekly - 2 sets - 10 reps  ASSESSMENT:  CLINICAL IMPRESSION: Treatment continued focus on trunk activation to improve his stability, and BLE strengthening. Then progressed to sit to stand activities to improve his ability to push through his legs and unweight his hips for transfers and hopefully for standing and walking activities. He demonstrated improved unweighting of his hips today with transfers.   From eval: Patient is a 57 y.o. M who was seen today for physical therapy evaluation and treatment for thoracic myelopathy. PMH significant for bilat knee OA (pt to get gel shots and is interested in future TKA once he gets his weight down) and T10 laminectomy with discectomy by Dr. Franky Macho on 03/12/2023. Went to inpatient rehab x 3 weeks. Has now been at home x 1 week. Pt states he does not currently have any known precautions. Assessment significant for gross bilat LE weakness (L LE weaker than R) affecting standing tolerance, transfers and balance. Pt is limited due to his knee pain. Pt will highly benefit from PT to  address these deficits to reach pt's goals for increased mobility and amb.   OBJECTIVE IMPAIRMENTS: Abnormal gait, decreased balance, decreased endurance, decreased mobility, difficulty walking, decreased ROM, decreased strength, increased edema, improper body mechanics, postural dysfunction, obesity, and pain.   GOALS: Goals reviewed with patient? Yes  SHORT TERM GOALS: Target date: 06/02/2023   Pt will be ind with initial HEP Baseline:  Goal status: 05/06/23- provided, but inconsistent in performing. Ongoing  2.  Pt  will be able to tolerate standing x 5 min to brush his teeth Baseline:  Goal status: INITIAL  3.  Pt will be able to perform stand pivot t/f with RW min a Baseline:  Goal status: 05/06/23- scooting transfers with min a. Ongoing   LONG TERM GOALS: Target date: 07/14/2023   Pt will be ind with management and progression of HEP Baseline:  Goal status: INITIAL  2.  Pt will be able to amb at least 20' with RW min A for home mobility Baseline:  Goal status: INITIAL  3.  Pt will be able to perform 5x STS to demo increased functional LE strength Baseline: Unable to perform complete STS without max A Goal status: INITIAL  4.  Pt will have increased FOTO to >/=54 Baseline:  Goal status: INITIAL  PLAN:  PT FREQUENCY: 2x/week  PT DURATION: 12 weeks  PLANNED INTERVENTIONS: 97164- PT Re-evaluation, 97110-Therapeutic exercises, 97530- Therapeutic activity, [id number]- Neuromuscular re-education, 97535- Self Care, 16109- Manual therapy, [id number]- Gait training, [phone]- Orthotic Fit/training, [id number]- Aquatic Therapy, 97014- Electrical stimulation (unattended), [id number]- Ultrasound, 91478- Ionotophoresis 4mg /ml Dexamethasone, Patient/Family education, Balance training, Stair training, Taping, Dry Needling, Joint mobilization, Spinal mobilization, Cryotherapy, and Moist heat  PLAN FOR NEXT SESSION: Assess response to HEP. Continue to work on LE strengthening and transfers/standing.     Iona Beard, DPT 05/07/2023, 9:43 AM

## 2023-05-11 ENCOUNTER — Ambulatory Visit: Payer: Managed Care, Other (non HMO) | Admitting: Physical Therapy

## 2023-05-11 ENCOUNTER — Ambulatory Visit: Payer: Managed Care, Other (non HMO) | Admitting: Occupational Therapy

## 2023-05-11 ENCOUNTER — Encounter: Payer: Self-pay | Admitting: Physical Therapy

## 2023-05-11 DIAGNOSIS — M6281 Muscle weakness (generalized): Secondary | ICD-10-CM

## 2023-05-11 DIAGNOSIS — R2681 Unsteadiness on feet: Secondary | ICD-10-CM

## 2023-05-11 DIAGNOSIS — M25662 Stiffness of left knee, not elsewhere classified: Secondary | ICD-10-CM

## 2023-05-11 DIAGNOSIS — M25661 Stiffness of right knee, not elsewhere classified: Secondary | ICD-10-CM

## 2023-05-11 DIAGNOSIS — G8929 Other chronic pain: Secondary | ICD-10-CM

## 2023-05-11 NOTE — Therapy (Signed)
OUTPATIENT PHYSICAL THERAPY CERVICAL/THORACIC TREATMENT   Patient Name: Raymond [name] MRN: [id number] DOB:1965-11-23, 57 y.o., male Today's Date: 05/11/2023  END OF SESSION:  PT End of Session - 05/11/23 1151     Visit Number 5    Date for PT Re-Evaluation 06/16/23    PT Start Time 1146    PT Stop Time 1225    PT Time Calculation (min) 39 min    Equipment Utilized During Treatment Gait belt    Activity Tolerance Patient tolerated treatment well    Behavior During Therapy WFL for tasks assessed/performed            Past Medical History:  Diagnosis Date   Anxiety    COPD (chronic obstructive pulmonary disease) (HCC)    Diabetes mellitus without complication (HCC)    Diverticulosis    with hx of LGIB   Edema    GERD (gastroesophageal reflux disease)    GIB (gastrointestinal bleeding)    Gout    Heavy smoker    Hyperlipidemia    Hypertension    Iron deficiency anemia    Sleep apnea    uses a cpap   Past Surgical History:  Procedure Laterality Date   CHONDROPLASTY Left 06/28/2014   Procedure: CHONDROPLASTY;  Surgeon: Thera Flake., MD;  Location: [facility];  Service: Orthopedics;  Laterality: Left;   COLONOSCOPY     FOOT FASCIOTOMY     age 18-rt   KNEE ARTHROSCOPY WITH LATERAL MENISECTOMY Left 06/28/2014   Procedure: KNEE ARTHROSCOPY WITH LATERAL MENISECTOMY;  Surgeon: Thera Flake., MD;  Location: [facility];  Service: Orthopedics;  Laterality: Left;   KNEE ARTHROSCOPY WITH MEDIAL MENISECTOMY Left 06/28/2014   Procedure: LEFT KNEE ARTHROSCOPY CHONDROPLASTY/WITH MEDIAL/LATERAL MENISECTOMIES;  Surgeon: Thera Flake., MD;  Location: [facility];  Service: Orthopedics;  Laterality: Left;   ORIF RADIUS & ULNA FRACTURES  2007   left   THORACIC DISCECTOMY N/A 03/12/2023   Procedure: THORACIC LAMINECTOMY AND  DISCECTOMY;  Surgeon: Coletta Memos, MD;  Location: [facility];  Service: Neurosurgery;  Laterality: N/A;   Patient Active  Problem List   Diagnosis Date Noted   Acute on chronic anemia 04/13/2023   Acute kidney injury superimposed on chronic kidney disease (HCC) 04/13/2023   GERD (gastroesophageal reflux disease) 04/13/2023   Constipation 04/13/2023   Chronic back pain 04/13/2023   DVT, bilateral lower limbs (HCC) 04/13/2023   Coping style affecting medical condition 03/27/2023   Thoracic myelopathy 03/23/2023   HNP (herniated nucleus pulposus with myelopathy), thoracic 03/12/2023   Hyperlipidemia 12/24/2022   Failure to thrive in adult 12/24/2022   Tobacco abuse 03/18/2018   BPH associated with nocturia 03/11/2017   History of substance abuse (HCC) 03/11/2017   Restless leg syndrome 03/11/2017   Restrictive lung disease 05/14/2016   OSA on CPAP 05/14/2016   Moderate COPD (chronic obstructive pulmonary disease) (HCC) 05/14/2016   Smoking greater than 40 pack years 05/14/2016   SOB (shortness of breath) 10/06/2014   Edema of extremities 10/06/2014   Benign essential HTN 10/06/2014   Type 2 diabetes mellitus, without long-term current use of insulin (HCC) 07/18/2011    PCP: Venetia Constable, MD  REFERRING PROVIDER:  Charlton Amor, PA-C   REFERRING DIAG: [id number] (ICD-10-CM) - Other spondylosis with myelopathy, thoracic region   THERAPY DIAG:  Muscle weakness (generalized)  Unsteadiness on feet  Chronic pain of left knee  Stiffness of left knee, not elsewhere classified  Stiffness of right knee, not elsewhere classified  Chronic pain of right knee  Rationale for Evaluation and Treatment: Rehabilitation  ONSET DATE: October 2024  SUBJECTIVE:                                                                                                                                                                                                         SUBJECTIVE STATEMENT: Pt reports that he felt good after last treatment. He overate yesterday and feels a little grouchy.  From eval: Pt  states he fell, broke his ankle and was found to later have a ruptured disc and had to get back surgery. Went to inpatient rehab x 3 weeks. Felt like he built up his upper body. Has now been home x 1 week. States he has not been doing much since then -- has done some sitting exercises. Was able to stand for 3 min bouts for a total of about 12 min using RW at inpatient rehab. Has history of bad knees and will be getting gel shots. 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 He was found to have large HNP T10/T11 and underwent T10 laminectomy with discectomy by Dr. Franky Macho on 03/12/2023   PAIN:  Are you having pain? No  PRECAUTIONS: None  RED FLAGS: None     WEIGHT BEARING RESTRICTIONS: No  FALLS:  Has patient fallen in last 6 months? Yes. Number of falls 1 -- last fall Oct 2024  LIVING ENVIRONMENT: Lives with: lives with their spouse Lives in: House/apartment Stairs:  ramp Has following equipment at home: Environmental consultant - 2 wheeled, Wheelchair (manual), and upright walker, hospital bed  OCCUPATION: Retired  PLOF:  Able to do his own ADLs but easier to have wife assist him  PATIENT GOALS: Walk with the walker at least household distances  NEXT MD VISIT: 04/21/23 [place]  OBJECTIVE:  Note: Objective measures were completed at Evaluation unless otherwise noted.  DIAGNOSTIC FINDINGS:  03/12/23 MRI THORACIC SPINE IMPRESSION:   1. Moderate-sized lobulated left subarticular to foraminal disc protrusion at T10-11 with resultant severe spinal stenosis. Cord is compressed and deviated to the right at this level. Associated cord signal changes concerning for edema and/or myelomalacia. 2. No other acute abnormality  within the thoracic spine. 3. Degenerative spondylosis at T8-9 through T11-12 without significant  spinal stenosis. Associated moderate to severe bilateral foraminal narrowing at these levels as above.  MRI LUMBAR SPINE IMPRESSION:   1. No acute abnormality within the lumbar spine. 2. Multifactorial degenerative changes at L4-5 with resultant mild canal with severe left and moderate right lateral recess stenosis, with severe bilateral L4 foraminal narrowing. 3. Additional mild noncompressive disc bulging and facet hypertrophy elsewhere within the lumbar spine as above. No other significant stenosis or frank neural impingement.   Critical Value/emergent results were called by telephone at the time of interpretation on 03/12/2023 at 12:41 am to provider Dr. Madilyn Hook, who verbally acknowledged these results.  PATIENT SURVEYS:  FOTO 42; predicted 6  COGNITION: Overall cognitive status: Within functional limits for tasks assessed  SENSATION: WFL  POSTURE: rounded shoulders and forward head  PALPATION: No overt tenderness to palpation   CERVICAL ROM: WNL  LOWER EXTREMITY MMT:    MMT Right eval Left eval  Hip flexion 3+ 3  Hip extension 3+ 3+  Hip abduction 3+ 3+  Hip adduction    Hip internal rotation    Hip external rotation    Knee flexion 4- 3+  Knee extension 4- 3+  Ankle dorsiflexion    Ankle plantarflexion    Ankle inversion    Ankle eversion     (Blank rows = not tested)   FUNCTIONAL TESTS:  Transfers:  Chair to bed: Multiple seated scoots with UEs  Sit<>stand: max A -- limited due to knee pain Supine to sit<>sit: min A for trunk and LE negotiation. Not performing log rolling  TODAY'S TREATMENT:                                                                                                                              DATE:  05/11/23 W/C > mat scoot transfer with S. Difficulty getting started, but completed without physical A. Moved into longsit on mat,  then scooted over and backwards. Performed multiple movements on the mat in order to facilitate active trunk stability, including prone lying, attempted to move to quadruped, but his knees kept slipping back as he tried to push up. Rolled to R and performed L hip abd, repeated on L sidelie for R hip. Sup to sit on edge of mat with much less effort required today, S Sit<> partial stand from elevated mat with hands on chair in front of him, able to come to stand with hands on seat, then place hands on the armrests of the chair. 3 reps. Sit to stand with mod A to initiate and given extra time to rise. Parallel bars- Sit to stand from W/C with Airex pad in seat to elevate slightly. Pushed up on parallel bars. Able to achieve fully upright stance and perform lat weight shifts, but had difficulty once he tried to lift either foot to simulate taking a step, poor control of limb in space.  05/07/23 Sliding board transfer to mat using lean technique with shoulders away from  hips to initiate. He then was able to lift his hips and scoot the rest of the way. Supine bridging x 10, able to clear hips Repeated roll to each side x 3 using trunk into flexion. Fitter press with 2 blue bands x 10 each leg, very challenging. Functional trunk and LE strengthening, seated on mat, turn to R, walking both hands away from trunk, then lifting L LE to touch heel to surface of mat. X 5 lifts to each side, very challenging. Sit<> partial stand from elevated mat with hands on chair in front of him, able to come to stand with hands on seat, then place hands on the armrests of the chair. Moved to parallel bars with Air ex pad on top of his W/C cushion to elevate him. Sit > Stand pulling bars, with mod A and time to push into full upright posture where he stood x approx 30 seconds. Relies on arms to A.  05/06/23 Lateral transfers from W/C <> mat with min A. Sit to supine with light min A to BLE Rolled to L- Therapist facilitated  lower trunk active mobilization in ant/post and inf/sup planes of movement. Moved to R LE with acitve hip abd, very weak, tends to turn and use quads, clamshell also required mod A. Facilitated repeated rolling using normalized technique with RLE and UE held in the air to engage trunk. Very challenging, but able to complete x 4. Repeated all activities in R side lying. Moved from Sup > long sit I, but did report some strain in back Sit <> partial stand from elevated mat, hands on the seat or armrests of a chair facing him, required +2 to initiate due to B knee pain, but he was able to rise into bent over standing position x5 and hold for up to 5 seconds. B scoot transfers, emphasizing lifting hips off the surface by pushing through legs, x 5 each way on mat Transfer back to W/C with CGA. Mat > W/C with CGA  04/23/23 Seated heel slide 2x10 Transfer x2; min A to stabilize w/c while getting to bed. Pt utilizing multiple scoots and UEs SLR short range x10 (>10 deg extensor lag bilat) Quad set 2x10 SAQ 2# ankle weight 2x10 Supine clamshell green TB 2x10 Bridge 2x10 Supine to sit log roll with SBA for v/cs In // bars pulling up for 1/4 to standing 2x5  In w/c pushing with legs only 2x10' In w/c pulling with legs only 2x10'  04/21/23 See HEP below   PATIENT EDUCATION:  Education details: Exam findings, POC, initial HEP Person educated: Patient Education method: Explanation, Demonstration, and Handouts Education comprehension: verbalized understanding, returned demonstration, and needs further education  HOME EXERCISE PROGRAM: Access Code: [email or web address] URL: [email or web address] Date: 04/21/2023 Prepared by: Vernon Prey April Kirstie Peri  Exercises - Staggered Bridge  - 1 x daily - 7 x weekly - 2 sets - 10 reps - Hooklying Isometric Clamshell  - 1 x daily - 7 x weekly - 2 sets - 10 reps - Small Range Straight Leg Raise  - 1 x daily - 7 x weekly - 2 sets - 10  reps  ASSESSMENT:  CLINICAL IMPRESSION: Treatment continued focus on trunk activation to improve his stability, and BLE strengthening. Then progressed to sit to stand activities to improve his ability to push through his legs and unweight his hips for transfers and hopefully for standing and walking activities.  Following similar POC as on previous treatments because patient is responding well to the program  and demonstrating improved I with mat mobility and standing.   From eval: Patient is a 57 y.o. M who was seen today for physical therapy evaluation and treatment for thoracic myelopathy. PMH significant for bilat knee OA (pt to get gel shots and is interested in future TKA once he gets his weight down) and T10 laminectomy with discectomy by Dr. Franky Macho on 03/12/2023. Went to inpatient rehab x 3 weeks. Has now been at home x 1 week. Pt states he does not currently have any known precautions. Assessment significant for gross bilat LE weakness (L LE weaker than R) affecting standing tolerance, transfers and balance. Pt is limited due to his knee pain. Pt will highly benefit from PT to address these deficits to reach pt's goals for increased mobility and amb.   OBJECTIVE IMPAIRMENTS: Abnormal gait, decreased balance, decreased endurance, decreased mobility, difficulty walking, decreased ROM, decreased strength, increased edema, improper body mechanics, postural dysfunction, obesity, and pain.   GOALS: Goals reviewed with patient? Yes  SHORT TERM GOALS: Target date: 06/02/2023   Pt will be ind with initial HEP Baseline:  Goal status: 05/06/23- provided, but inconsistent in performing. Ongoing  2.  Pt will be able to tolerate standing x 5 min to brush his teeth Baseline:  Goal status: INITIAL  3.  Pt will be able to perform stand pivot t/f with RW min a Baseline:  Goal status: 05/06/23- scooting transfers with min a. Ongoing   LONG TERM GOALS: Target date: 07/14/2023   Pt will be ind with  management and progression of HEP Baseline:  Goal status: INITIAL  2.  Pt will be able to amb at least 20' with RW min A for home mobility Baseline:  Goal status: INITIAL  3.  Pt will be able to perform 5x STS to demo increased functional LE strength Baseline: Unable to perform complete STS without max A Goal status: INITIAL  4.  Pt will have increased FOTO to >/=54 Baseline:  Goal status: INITIAL  PLAN:  PT FREQUENCY: 2x/week  PT DURATION: 12 weeks  PLANNED INTERVENTIONS: 97164- PT Re-evaluation, 97110-Therapeutic exercises, 97530- Therapeutic activity, [id number]- Neuromuscular re-education, 97535- Self Care, 54098- Manual therapy, [id number]- Gait training, [phone]- Orthotic Fit/training, [phone]- Aquatic Therapy, 97014- Electrical stimulation (unattended), [id number]- Ultrasound, 62130- Ionotophoresis 4mg /ml Dexamethasone, Patient/Family education, Balance training, Stair training, Taping, Dry Needling, Joint mobilization, Spinal mobilization, Cryotherapy, and Moist heat  PLAN FOR NEXT SESSION: Assess response to HEP. Continue to work on LE strengthening and transfers/standing.    Iona Beard, DPT 05/11/2023, 1:39 PM

## 2023-05-11 NOTE — Therapy (Signed)
OUTPATIENT OCCUPATIONAL THERAPY NEURO Treatment  Patient Name: Raymond [name] MRN: [id number] DOB:[birth year], 57 y.o., male Today's Date: 05/11/2023  PCP: Dr. Tawanna Solo PROVIDER: Dr. Carlis Abbott  END OF SESSION:  OT End of Session - 05/11/23 1243     Visit Number 4    Number of Visits 25    Date for OT Re-Evaluation 07/15/23    Authorization Type cigna    Authorization Time Period 12 weeks    OT Start Time 1232    OT Stop Time 1312    OT Time Calculation (min) 40 min              Past Medical History:  Diagnosis Date   Anxiety    COPD (chronic obstructive pulmonary disease) (HCC)    Diabetes mellitus without complication (HCC)    Diverticulosis    with hx of LGIB   Edema    GERD (gastroesophageal reflux disease)    GIB (gastrointestinal bleeding)    Gout    Heavy smoker    Hyperlipidemia    Hypertension    Iron deficiency anemia    Sleep apnea    uses a cpap   Past Surgical History:  Procedure Laterality Date   CHONDROPLASTY Left 06/28/2014   Procedure: CHONDROPLASTY;  Surgeon: Thera Flake., MD;  Location: [facility];  Service: Orthopedics;  Laterality: Left;   COLONOSCOPY     FOOT FASCIOTOMY     age 36-rt   KNEE ARTHROSCOPY WITH LATERAL MENISECTOMY Left 06/28/2014   Procedure: KNEE ARTHROSCOPY WITH LATERAL MENISECTOMY;  Surgeon: Thera Flake., MD;  Location: [facility];  Service: Orthopedics;  Laterality: Left;   KNEE ARTHROSCOPY WITH MEDIAL MENISECTOMY Left 06/28/2014   Procedure: LEFT KNEE ARTHROSCOPY CHONDROPLASTY/WITH MEDIAL/LATERAL MENISECTOMIES;  Surgeon: Thera Flake., MD;  Location: [facility];  Service: Orthopedics;  Laterality: Left;   ORIF RADIUS & ULNA FRACTURES  2007   left   THORACIC DISCECTOMY N/A 03/12/2023   Procedure: THORACIC LAMINECTOMY AND  DISCECTOMY;  Surgeon: Coletta Memos, MD;  Location: [facility];  Service: Neurosurgery;  Laterality: N/A;   Patient Active Problem List   Diagnosis  Date Noted   Acute on chronic anemia 04/13/2023   Acute kidney injury superimposed on chronic kidney disease (HCC) 04/13/2023   GERD (gastroesophageal reflux disease) 04/13/2023   Constipation 04/13/2023   Chronic back pain 04/13/2023   DVT, bilateral lower limbs (HCC) 04/13/2023   Coping style affecting medical condition 03/27/2023   Thoracic myelopathy 03/23/2023   HNP (herniated nucleus pulposus with myelopathy), thoracic 03/12/2023   Hyperlipidemia 12/24/2022   Failure to thrive in adult 12/24/2022   Tobacco abuse 03/18/2018   BPH associated with nocturia 03/11/2017   History of substance abuse (HCC) 03/11/2017   Restless leg syndrome 03/11/2017   Restrictive lung disease 05/14/2016   OSA on CPAP 05/14/2016   Moderate COPD (chronic obstructive pulmonary disease) (HCC) 05/14/2016   Smoking greater than 40 pack years 05/14/2016   SOB (shortness of breath) 10/06/2014   Edema of extremities 10/06/2014   Benign essential HTN 10/06/2014   Type 2 diabetes mellitus, without long-term current use of insulin (HCC) 07/18/2011    ONSET DATE: 04/08/23- referral date  REFERRING DIAG:  Diagnosis  M47.14 (ICD-10-CM) - Other spondylosis with myelopathy, thoracic region    THERAPY DIAG:  Muscle weakness (generalized)  Rationale for Evaluation and Treatment: Rehabilitation  SUBJECTIVE:   SUBJECTIVE STATEMENT: Pt reports mild pain in back Pt accompanied by:  self  PERTINENT HISTORY: 57 y.o. male presenting with increased low back pain after a fall. Pt was found to have thoracic myelopathy due to a disc herniation at T10/11. He is now s/p T10 laminectomy and Discectomy. Pt received therapies at [facility] and he d/c home 03/23/23 PMH of anxiety, COPD, DM, Edema, Gout, HTN, HLD, L medial menisectomy, ORIF L radius and ulna.  PRECAUTIONS: Back  WEIGHT BEARING RESTRICTIONS: No  PAIN:  Are you having pain? Yes: NPRS scale: 3-4/10 Pain location: back Pain description: aching Aggravating  factors: sitting still Relieving factors: meds  FALLS: Has patient fallen in last 6 months? Yes. Number of falls 1  LIVING ENVIRONMENT: Lives with: lives with their spouse Lives in: House/apartment Stairs:  has ramp Has following equipment at home: Walker - 2 wheeled and bed side commode  PLOF: Independent with basic ADLs  PATIENT GOALS: increase I with ADLS/ADLS  OBJECTIVE:  Note: Objective measures were completed at Evaluation unless otherwise noted.  HAND DOMINANCE: Right  ADLs: Overall ADLs: increased time required, uses w/c, pt is not able to ambulate or stand for ADLs at this time Transfers/ambulation related to ADLs: Eating: mod I Grooming: mod I UB Dressing: setup LB Dressing: mod A,  Toileting: uses bedside commode supervision-min A for scooting Bathing: sponge bathing, min A per pt report Tub Shower transfers: n/a  Equipment: bed side commode Unable to get into bathroom with w/c  IADLs: dependent with IADLS   MOBILITY STATUS:  supervision to min A for transfers  -scooting from w/c, pt is unable to stand for ADLs  Activitiy tolerance: 45 mins to hour with rest break    UPPER EXTREMITY ROM:    Active ROM Right eval Left eval  Shoulder flexion 90 110  Shoulder abduction 90 100  Shoulder adduction    Shoulder extension    Shoulder internal rotation    Shoulder external rotation    Elbow flexion [facility] WFL  Elbow extension [facility] WFL  Wrist flexion    Wrist extension    Wrist ulnar deviation    Wrist radial deviation    Wrist pronation    Wrist supination  90%  (Blank rows = not tested)  UPPER EXTREMITY MMT:     MMT Right eval Left eval  Shoulder flexion 3+/5 4/5  Shoulder abduction    Shoulder adduction    Shoulder extension    Shoulder internal rotation    Shoulder external rotation    Middle trapezius    Lower trapezius    Elbow flexion 4/5 4+/5  Elbow extension 4//5 4+/5  Wrist flexion    Wrist extension    Wrist ulnar deviation     Wrist radial deviation    Wrist pronation    Wrist supination    (Blank rows = not tested)  HAND FUNCTION: Grip strength: Right: 72 lbs; Left: 75 lbs  COORDINATION: 9 Hole Peg test: Right: 23.21 sec; Left: 32.52 sec  SENSATION: WFL      COGNITION: Overall cognitive status: Within functional limits for tasks assessed    OBSERVATIONS: Pleasant male is highly motivated to improve   TODAY'S TREATMENT:  DATE: 05/11/23-UBE x 6 mins level 2 for conditioning Biceps curls 2 sets of 10-15 reps with 2 lbs hand weights, triceps ext with red band, min v.c 15 reps each UE Closed chain shoulder flexion with cane 15 reps to shoulder height, min v.c to avoid shoulder hike, then 15 reps with 2 lbs cane  Pt practiced scooting along edge of mat with emphasis on lifting up with arms and legs before scoot, min v.c Pt simulated donning pants with reacher and min v.c and demonstration. Pt transferred back to w/c via sliding board with supervision min-mod v.c  05/07/23 UBE x 6 mins level 3 for conditioning Pt was instructed in updates to HEP, for bilateral UE's min v.c and demonstration 10 reps each, red band and cane were used see pt instuctions.  05/06/23- UBE x 6 mins level 2 for conditioning, seated in w/c Pt transferred scoot pivot without sliding board to mat with supervision Seated on mat closed chain shoulder flexion and biceps curls 10 reps each, min v.c  Biceps curls x 20 reps with 3lbs weight bilateral UE's, min v.c shoulder flexion bilateal UE's with 2 lbs weight in each hand, 15 reps. Pt practiced scooting on mat with cueing to shift weight fowrard and pushdown through hands to scoot, pt also practiced using this technique for a sliding board transfer back to w/c. min v.c Pt's wife rports pt an't go into the kitchen because he can't remove his leg rests. Pt  practiced removing and reattaching w/c leg rests with min v.c the pt practiced walking his w/c forwards with legs while assisting aby pushing wheels. see education 04/22/23 eval  PATIENT EDUCATION: Education details:  donning pants with reacher Person educated: Patient Education method: Explanation and Verbal cues, min v.c Education comprehension: verbalized understanding, returned demonstration, v.c  HOME EXERCISE PROGRAM:  red band and vertical cane 05/07/23   GOALS: Goals reviewed with patient? Yes  SHORT TERM GOALS: Target date: 05/20/23  I with inital HEP.  Goal status: ongoing, 05/11/23  2.  Pt will pefrom LB dressing with min A  Goal status: ongoing, 05/11/23  3.  Pt will consistently perform toiltet transfers with supervision.  Goal status: ongoing, 05/11/23  4.  Pt will perform snack or beverage prep at a w/c level  Goal status: ongoing, 05/11/23  5. Pt will verbalize understanding of back precautions.  Goal status: ongoing, 05/11/23    LONG TERM GOALS: Target date: 07/15/23  I with updated HEP. Baseline:  Goal status: INITIAL  2.  Pt will perform bathing with supervision/ set up  Goal status: INITIAL  3.  Pt will perform LB dressing with supervision/ set up.  Goal status: INITIAL  4.  Pt will demonstrate ability with stand for 5 mins with min A for ADLs/ IADLs.  Goal status: INITIAL  5.  Pt will perfrom simple home management at a walker level with min A.  Goal status: INITIAL   ASSESSMENT: Pt is progressing towards goals. Pt demonstrates understanding of how to use reacher for LB dressing. CLINICAL IMPRESSION: Pt is progressing towards. He demonstrates understanding of intial HEP. PERFORMANCE DEFICITS: in functional skills including ADLs, IADLs, ROM, strength, pain, flexibility, mobility, balance, body mechanics, endurance, decreased knowledge of precautions, decreased knowledge of use of DME, and UE functional use,  and psychosocial skills  including coping strategies, environmental adaptation, habits, interpersonal interactions, and routines and behaviors.   IMPAIRMENTS: are limiting patient from ADLs, IADLs, rest and sleep, work, play, leisure, and social participation.  CO-MORBIDITIES: [place] have co-morbidities  that affects occupational performance. Patient will benefit from skilled OT to address above impairments and improve overall function.  MODIFICATION OR ASSISTANCE TO COMPLETE EVALUATION: No modification of tasks or assist necessary to complete an evaluation.  OT OCCUPATIONAL PROFILE AND HISTORY: Detailed assessment: Review of records and additional review of physical, cognitive, psychosocial history related to current functional performance.  CLINICAL DECISION MAKING: LOW - limited treatment options, no task modification necessary  REHAB POTENTIAL: Good  EVALUATION COMPLEXITY: Low    PLAN:  OT FREQUENCY: 2x/week  OT DURATION: 12 weeks plus eval  PLANNED INTERVENTIONS: 97168 OT Re-evaluation, 97535 self care/ADL training, 98119 therapeutic exercise, 97530 therapeutic activity, 97112 neuromuscular re-education, 97140 manual therapy, 97116 gait training, 14782 aquatic therapy, 97035 ultrasound, 97018 paraffin, 95621 moist heat, 97010 cryotherapy, passive range of motion, balance training, stair training, functional mobility training, psychosocial skills training, energy conservation, coping strategies training, patient/family education, and DME and/or AE instructions  RECOMMENDED OTHER SERVICES: PT  CONSULTED AND AGREED WITH PLAN OF CARE: Patient  PLAN FOR NEXT SESSION:  continue with ADL strategies,UE strength   Khadijatou Borak, OT 05/11/2023, 12:44 PM

## 2023-05-12 ENCOUNTER — Encounter: Payer: Self-pay | Admitting: Physical Medicine and Rehabilitation

## 2023-05-12 ENCOUNTER — Encounter
Payer: Managed Care, Other (non HMO) | Attending: Physical Medicine and Rehabilitation | Admitting: Physical Medicine and Rehabilitation

## 2023-05-12 VITALS — BP 179/95 | HR 80 | Ht 71.0 in

## 2023-05-12 DIAGNOSIS — E66811 Obesity, class 1: Secondary | ICD-10-CM | POA: Diagnosis present

## 2023-05-12 DIAGNOSIS — E119 Type 2 diabetes mellitus without complications: Secondary | ICD-10-CM

## 2023-05-12 DIAGNOSIS — N179 Acute kidney failure, unspecified: Secondary | ICD-10-CM

## 2023-05-12 DIAGNOSIS — R195 Other fecal abnormalities: Secondary | ICD-10-CM

## 2023-05-12 DIAGNOSIS — K5903 Drug induced constipation: Secondary | ICD-10-CM | POA: Diagnosis present

## 2023-05-12 DIAGNOSIS — Z794 Long term (current) use of insulin: Secondary | ICD-10-CM

## 2023-05-12 DIAGNOSIS — M545 Low back pain, unspecified: Secondary | ICD-10-CM

## 2023-05-12 DIAGNOSIS — Z6830 Body mass index (BMI) 30.0-30.9, adult: Secondary | ICD-10-CM

## 2023-05-12 DIAGNOSIS — G8929 Other chronic pain: Secondary | ICD-10-CM

## 2023-05-12 DIAGNOSIS — I10 Essential (primary) hypertension: Secondary | ICD-10-CM | POA: Diagnosis present

## 2023-05-12 DIAGNOSIS — M4714 Other spondylosis with myelopathy, thoracic region: Secondary | ICD-10-CM

## 2023-05-12 DIAGNOSIS — N189 Chronic kidney disease, unspecified: Secondary | ICD-10-CM | POA: Diagnosis present

## 2023-05-12 MED ORDER — DOCUSATE SODIUM 100 MG PO CAPS: 100.0000 mg | ORAL_CAPSULE | Freq: Two times a day (BID) | ORAL | 0 refills | Status: DC

## 2023-05-12 MED ORDER — CAMPHOR-MENTHOL 0.5-0.5 % EX LOTN: TOPICAL_LOTION | CUTANEOUS | 0 refills | Status: DC | PRN

## 2023-05-12 MED ORDER — LOSARTAN POTASSIUM 50 MG PO TABS: 50.0000 mg | ORAL_TABLET | Freq: Every day | ORAL | 0 refills | Status: DC

## 2023-05-12 MED ORDER — DOXAZOSIN MESYLATE 2 MG PO TABS: 2.0000 mg | ORAL_TABLET | Freq: Every day | ORAL | 0 refills | Status: AC

## 2023-05-12 MED ORDER — ALLOPURINOL 300 MG PO TABS: 300.0000 mg | ORAL_TABLET | Freq: Every day | ORAL | 3 refills | Status: DC

## 2023-05-12 MED ORDER — MELATONIN 5 MG PO TABS: 5.0000 mg | ORAL_TABLET | Freq: Every evening | ORAL | 0 refills | Status: DC | PRN

## 2023-05-12 MED ORDER — ALBUTEROL SULFATE HFA 108 (90 BASE) MCG/ACT IN AERS: 2.0000 | INHALATION_SPRAY | Freq: Four times a day (QID) | RESPIRATORY_TRACT | 3 refills | Status: AC | PRN

## 2023-05-12 MED ORDER — ACETAMINOPHEN 325 MG PO TABS: 325.0000 mg | ORAL_TABLET | ORAL | Status: DC | PRN

## 2023-05-12 MED ORDER — PANTOPRAZOLE SODIUM 40 MG PO TBEC: 80.0000 mg | DELAYED_RELEASE_TABLET | Freq: Every day | ORAL | 0 refills | Status: DC

## 2023-05-12 MED ORDER — ALPRAZOLAM 0.5 MG PO TABS: 0.5000 mg | ORAL_TABLET | Freq: Two times a day (BID) | ORAL | 3 refills | Status: AC | PRN

## 2023-05-12 MED ORDER — PRAMIPEXOLE DIHYDROCHLORIDE 0.5 MG PO TABS: 0.5000 mg | ORAL_TABLET | Freq: Two times a day (BID) | ORAL | 3 refills | Status: AC

## 2023-05-12 MED ORDER — BACLOFEN 10 MG PO TABS: 10.0000 mg | ORAL_TABLET | Freq: Three times a day (TID) | ORAL | 0 refills | Status: DC

## 2023-05-12 MED ORDER — SENNA 8.6 MG PO TABS: 2.0000 | ORAL_TABLET | Freq: Every day | ORAL | 0 refills | Status: DC

## 2023-05-12 MED ORDER — SENNOSIDES-DOCUSATE SODIUM 8.6-50 MG PO TABS: 2.0000 | ORAL_TABLET | Freq: Every day | ORAL | 0 refills | Status: DC

## 2023-05-12 MED ORDER — POLYETHYLENE GLYCOL 3350 17 GM/SCOOP PO POWD: 17.0000 g | Freq: Every day | ORAL | 0 refills | Status: DC

## 2023-05-12 MED ORDER — CARVEDILOL 25 MG PO TABS: 25.0000 mg | ORAL_TABLET | Freq: Two times a day (BID) | ORAL | 0 refills | Status: AC

## 2023-05-12 MED ORDER — ATORVASTATIN CALCIUM 40 MG PO TABS: 40.0000 mg | ORAL_TABLET | Freq: Every evening | ORAL | 0 refills | Status: AC

## 2023-05-12 MED ORDER — DICLOFENAC SODIUM 1 % EX GEL: 4.0000 g | Freq: Four times a day (QID) | CUTANEOUS | 0 refills | Status: AC

## 2023-05-12 MED ORDER — APIXABAN 5 MG PO TABS: 5.0000 mg | ORAL_TABLET | Freq: Two times a day (BID) | ORAL | 0 refills | Status: DC

## 2023-05-12 MED ORDER — TIZANIDINE HCL 2 MG PO TABS: 2.0000 mg | ORAL_TABLET | Freq: Every evening | ORAL | 0 refills | Status: DC

## 2023-05-12 MED ORDER — LIDOCAINE 5 % EX PTCH: 2.0000 | MEDICATED_PATCH | Freq: Every day | CUTANEOUS | 0 refills | Status: AC

## 2023-05-12 MED ORDER — DULOXETINE HCL 30 MG PO CPEP: 30.0000 mg | ORAL_CAPSULE | Freq: Every day | ORAL | 0 refills | Status: DC

## 2023-05-12 MED ORDER — TOPIRAMATE 25 MG PO TABS: 25.0000 mg | ORAL_TABLET | Freq: Two times a day (BID) | ORAL | 3 refills | Status: AC

## 2023-05-12 NOTE — Patient Instructions (Signed)
Insomnia: -Try to go outside near sunrise -Get exercise during the day.  -Turn off all devices an hour before bedtime.  -Teas that can benefit: chamomile, valerian root, Brahmi (Bacopa) -Can consider over the counter melatonin, magnesium, and/or L-theanine. Melatonin is an anti-oxidant with multiple health benefits. Magnesium is involved in greater than 300 enzymatic reactions in the body and most of us are deficient as our soil is often depleted. There are 7 different types of magnesium- Bioptemizer's is a supplement with all 7 types, and each has unique benefits. Magnesium can also help with constipation and anxiety.  -Pistachios naturally increase the production of melatonin -[employer] bamboo bed sheets are free from toxic chemicals.  -Tart cherry juice or a tart cherry supplement can improve sleep and soreness post-workout   

## 2023-05-12 NOTE — Progress Notes (Unsigned)
Subjective:     Patient ID: Raymond [name], male   DOB: 1966/06/01, 57 y.o.   MRN: [id number]  HPI Raymond [name] is a 57 year old man who presents for follow-up up of thoracic myelopathy.  1) Thoracic myelopathy: -improving slowly but surely -transferring independently  -spends a lot of time in the recliner  2) HTN:  -had  red bull today  3) Left flank nodule:  -painful at times.   Pain Inventory Average Pain 7 Pain Right Now 3 My pain is intermittent, sharp, burning, dull, stabbing, tingling, and aching  In the last 24 hours, has pain interfered with the following? General activity 0 Relation with others 0 Enjoyment of life 4 What TIME of day is your pain at its worst? morning , daytime, evening, night, and varies Sleep (in general) Poor  Pain is worse with: sitting and some activites Pain improves with: rest, heat/ice, therapy/exercise, and medication Relief from Meds: 3  ability to climb steps?  no do you drive?  no use a wheelchair transfers alone Do you have any goals in this area?  yes  employed # of hrs/week medical leave I need assistance with the following:  dressing, bathing, meal prep, household duties, and shopping Do you have any goals in this area?  yes  weakness numbness tremor tingling trouble walking spasms depression anxiety  Any changes since last visit?  no  Any changes since last visit?  no    Family History  Problem Relation Age of Onset   Diabetes Mother    Hypertension Mother    Diabetes Father    Hypertension Father    Cancer Other    Social History   Socioeconomic History   Marital status: Single    Spouse name: Not on file   Number of children: Not on file   Years of education: Not on file   Highest education level: Not on file  Occupational History   Not on file  Tobacco Use   Smoking status: Every Day    Current packs/day: 2.00    Types: Cigarettes   Smokeless tobacco: Never  Vaping Use   Vaping status: Not on file   Substance and Sexual Activity   Alcohol use: Yes    Comment: rare   Drug use: No   Sexual activity: Not on file  Other Topics Concern   Not on file  Social History Narrative   Not on file   Social Determinants of Health   Financial Resource Strain: Not on file  Food Insecurity: No Food Insecurity (03/18/2023)   Hunger Vital Sign    Worried About Running Out of Food in the Last Year: Never true    Ran Out of Food in the Last Year: Never true  Transportation Needs: No Transportation Needs (03/18/2023)   PRAPARE - Administrator, Civil Service (Medical): No    Lack of Transportation (Non-Medical): No  Physical Activity: Not on file  Stress: No Stress Concern Present (03/21/2022)   Received from [facility], [facility] - Occupational Stress Questionnaire    Feeling of Stress : Not at all  Social Connections: Unknown (01/10/2022)   Received from [facility], [facility]   Social Network    Social Network: Not on file   Past Surgical History:  Procedure Laterality Date   CHONDROPLASTY Left 06/28/2014   Procedure: CHONDROPLASTY;  Surgeon: Thera Flake., MD;  Location: [facility];  Service:  Orthopedics;  Laterality: Left;   COLONOSCOPY     FOOT FASCIOTOMY     age 84-rt   KNEE ARTHROSCOPY WITH LATERAL MENISECTOMY Left 06/28/2014   Procedure: KNEE ARTHROSCOPY WITH LATERAL MENISECTOMY;  Surgeon: Thera Flake., MD;  Location: [facility];  Service: Orthopedics;  Laterality: Left;   KNEE ARTHROSCOPY WITH MEDIAL MENISECTOMY Left 06/28/2014   Procedure: LEFT KNEE ARTHROSCOPY CHONDROPLASTY/WITH MEDIAL/LATERAL MENISECTOMIES;  Surgeon: Thera Flake., MD;  Location: [facility];  Service: Orthopedics;  Laterality: Left;   ORIF RADIUS & ULNA FRACTURES  2007   left   THORACIC DISCECTOMY N/A 03/12/2023   Procedure: THORACIC LAMINECTOMY AND  DISCECTOMY;  Surgeon: Coletta Memos, MD;   Location: [facility] OR;  Service: Neurosurgery;  Laterality: N/A;   Past Medical History:  Diagnosis Date   Anxiety    COPD (chronic obstructive pulmonary disease) (HCC)    Diabetes mellitus without complication (HCC)    Diverticulosis    with hx of LGIB   Edema    GERD (gastroesophageal reflux disease)    GIB (gastrointestinal bleeding)    Gout    Heavy smoker    Hyperlipidemia    Hypertension    Iron deficiency anemia    Sleep apnea    uses a cpap   Ht 5\' 11"  (1.803 m)   BMI 50.49 kg/m   Opioid Risk Score:   Fall Risk Score:  `1  Depression screen PHQ 2/9      No data to display          Review of Systems  Musculoskeletal:  Positive for gait problem.       Spasms  Neurological:  Positive for tremors, weakness and numbness.       Tingling  Psychiatric/Behavioral:         Depression, anxiety  All other systems reviewed and are negative.     Objective:   Physical Exam Gen: no distress, normal appearing HEENT: oral mucosa pink and moist, NCAT Cardio: Reg rate Chest: normal effort, normal rate of breathing Abd: soft, non-distended Ext: no edema Psych: pleasant, normal affect Skin: intact Neuro: Alert and oriented x3     Assessment:         Plan:   1) Thoracic myelopathy: -continue therapy  2) Obesity: -Educated regarding health benefits of weight loss- for pain, general health, chronic disease prevention, immune health, mental health.  -Will monitor weight every visit.  -Consider Ro[place]ois tea daily.  -Discussed the benefits of intermittent fasting. -Discussed foods that can assist in weight loss: 1) leafy greens- high in fiber and nutrients 2) dark chocolate- improves metabolism (if prefer sweetened, best to sweeten with honey instead of sugar).  3) cruciferous vegetables- high in fiber and protein 4) full fat yogurt: high in healthy fat, protein, calcium, and probiotics 5) apples- high in a variety of phytochemicals 6) nuts- high in fiber and  protein that increase feelings of fullness 7) grapefruit: rich in nutrients, antioxidants, and fiber (not to be taken with anticoagulation) 8) beans- high in protein and fiber 9) salmon- has high quality protein and healthy fats 10) green tea- rich in polyphenols 11) eggs- rich in choline and vitamin D 12) tuna- high protein, boosts metabolism 13) avocado- decreases visceral abdominal fat 14) chicken (pasture raised): high in protein and iron 15) blueberries- reduce abdominal fat and cholesterol 16) whole grains- decreases calories retained during digestion, speeds metabolism 17) chia seeds- curb appetite 18) chilies- increases fat metabolism  -Discussed supplements  that can be used:  1) Metatrim 400mg  BID 30 minutes before breakfast and dinner  2) Sphaeranthus indicus and Garcinia mangostana (combinations of these and #1 can be found in capsicum and zychrome  3) green coffee bean extract 400mg  twice per day or Irvingia (african mango) 150 to 300mg  twice per day.   3) HTN: -Advised checking BP daily at home and logging results to bring into follow-up appointment with PCP and myself. -Reviewed BP meds today.  -Advised regarding healthy foods that can help lower blood pressure and provided with a list: 1) citrus foods- high in vitamins and minerals 2) salmon and other fatty fish - reduces inflammation and oxylipins 3) swiss chard (leafy green)- high level of nitrates 4) pumpkin seeds- one of the best natural sources of magnesium 5) Beans and lentils- high in fiber, magnesium, and potassium 6) Berries- high in flavonoids 7) Amaranth (whole grain, can be cooked similarly to rice and oats)- high in magnesium and fiber 8) Pistachios- even more effective at reducing BP than other nuts 9) Carrots- high in phenolic compounds that relax blood vessels and reduce inflammation 10) Celery- contain phthalides that relax tissues of arterial walls 11) Tomatoes- can also improve cholesterol and reduce  risk of heart disease 12) Broccoli- good source of magnesium, calcium, and potassium 13) Greek yogurt: high in potassium and calcium 14) Herbs and spices: Celery seed, cilantro, saffron, lemongrass, black cumin, ginseng, cinnamon, cardamom, sweet basil, and ginger 15) Chia and flax seeds- also help to lower cholesterol and blood sugar 16) Beets- high levels of nitrates that relax blood vessels  17) spinach and bananas- high in potassium  -Provided lise of supplements that can help with hypertension:  1) magnesium: one high quality brand is Bioptemizers since it contains all 7 types of magnesium, otherwise over the counter magnesium gluconate 400mg  is a good option 2) B vitamins 3) vitamin D 4) potassium 5) CoQ10 6) L-arginine 7) Vitamin C 8) Beetroot -Educated that goal BP is 120/80. -Made goal to incorporate some of the above foods into diet.    4) Diabetes: -check CBGs daily, log, and bring log to follow-up appointment -avoid sugar, bread, pasta, rice -avoid snacking -perform daily foot exam and at least annual eye exam -try to incorporate into your diet some of the following foods which are good for diabetes: 1) cinnamon- imitates effects of insulin, increasing glucose transport into cells ([place] or [place] cinnamon is best, least processed) 2) nuts- can slow down the blood sugar response of carbohydrate rich foods 3) oatmeal- contains and anti-inflammatory compound avenanthramide 4) whole-milk yogurt (best types are no sugar, [place] yogurt, or goat/sheep yogurt) 5) beans- high in protein, fiber, and vitamins, low glycemic index 6) broccoli- great source of vitamin A and C 7) quinoa- higher in protein and fiber than other grains 8) spinach- high in vitamin A, fiber, and protein 9) olive oil- reduces glucose levels, LDL, and triglycerides 10) salmon- excellent amount of omega-3-fatty acids 11) walnuts- rich in antioxidants 12) apples- high in fiber and quercetin 13)  carrots- highly nutritious with low impact on blood sugar 14) eggs- improve HDL (good cholesterol), high in protein, keep you satiated 15) turmeric: improves blood sugars, cardiovascular disease, and protects kidney health 16) garlic: improves blood sugar, blood pressure, pain 17) tomatoes: highly nutritious with low impact on blood sugar   5) Chronic Pain: -Discussed current symptoms of pain and history of pain.  -Discussed benefits of exercise in reducing pain. -Discussed following foods that [place] reduce pain:  1) Ginger (especially studied for arthritis)- reduce leukotriene production to decrease inflammation 2) Blueberries- high in phytonutrients that decrease inflammation 3) Salmon- marine omega-3s reduce joint swelling and pain 4) Pumpkin seeds- reduce inflammation 5) dark chocolate- reduces inflammation 6) turmeric- reduces inflammation 7) tart cherries - reduce pain and stiffness 8) extra virgin olive oil - its compound olecanthal helps to block prostaglandins  9) chili peppers- can be eaten or applied topically via capsaicin 10) mint- helpful for headache, muscle aches, joint pain, and itching 11) garlic- reduces inflammation  Link to further information on diet for chronic pain: [email or web address]   6) Constipation:  -Provided list of following foods that help with constipation and highlighted a few: 1) prunes- contain high amounts of fiber.  2) apples- has a form of dietary fiber called pectin that accelerates stool movement and increases beneficial gut bacteria 3) pears- in addition to fiber, also high in fructose and sorbitol which have laxative effect 4) figs- contain an enzyme ficin which helps to speed colonic transit 5) kiwis- contain an enzyme actinidin that improves gut motility and reduces constipation 6) oranges- rich in pectin (like apples) 7) grapefruits- contain a flavanol naringenin which  has a laxative effect 8) vegetables- rich in fiber and also great sources of folate, vitamin C, and K 9) artichoke- high in inulin, prebiotic great for the microbiome 10) chicory- increases stool frequency and softness (can be added to coffee) 11) rhubarb- laxative effect 12) sweet potato- high fiber 13) beans, peas, and lentils- contain both soluble and insoluble fiber 14) chia seeds- improves intestinal health and gut flora 15) flaxseeds- laxative effect 16) whole grain rye bread- high in fiber 17) oat bran- high in soluble and insoluble fiber 18) kefir- softens stools -recommended to try at least one of these foods every day.  -drink 6-8 glasses of water per day -walk regularly, especially after meals.      7) Dark stool: -check Hgb tomorrow  8) Acute on chronic CKD:  -repeat creatinine tomorrow

## 2023-05-13 ENCOUNTER — Encounter: Payer: Self-pay | Admitting: Bariatrics

## 2023-05-13 ENCOUNTER — Ambulatory Visit (INDEPENDENT_AMBULATORY_CARE_PROVIDER_SITE_OTHER): Payer: Managed Care, Other (non HMO) | Admitting: Bariatrics

## 2023-05-13 VITALS — BP 170/91 | HR 78 | Temp 97.5°F | Ht 71.0 in | Wt 354.9 lb

## 2023-05-13 DIAGNOSIS — E7849 Other hyperlipidemia: Secondary | ICD-10-CM

## 2023-05-13 DIAGNOSIS — E119 Type 2 diabetes mellitus without complications: Secondary | ICD-10-CM

## 2023-05-13 DIAGNOSIS — I10 Essential (primary) hypertension: Secondary | ICD-10-CM

## 2023-05-13 DIAGNOSIS — Z1331 Encounter for screening for depression: Secondary | ICD-10-CM | POA: Diagnosis not present

## 2023-05-13 DIAGNOSIS — R5383 Other fatigue: Secondary | ICD-10-CM

## 2023-05-13 DIAGNOSIS — E66813 Obesity, class 3: Secondary | ICD-10-CM

## 2023-05-13 DIAGNOSIS — N1832 Chronic kidney disease, stage 3b: Secondary | ICD-10-CM

## 2023-05-13 DIAGNOSIS — E559 Vitamin D deficiency, unspecified: Secondary | ICD-10-CM

## 2023-05-13 DIAGNOSIS — E785 Hyperlipidemia, unspecified: Secondary | ICD-10-CM

## 2023-05-13 DIAGNOSIS — Z6841 Body Mass Index (BMI) 40.0 and over, adult: Secondary | ICD-10-CM

## 2023-05-13 DIAGNOSIS — R0602 Shortness of breath: Secondary | ICD-10-CM | POA: Diagnosis not present

## 2023-05-13 DIAGNOSIS — Z7985 Long-term (current) use of injectable non-insulin antidiabetic drugs: Secondary | ICD-10-CM

## 2023-05-13 MED ORDER — ONDANSETRON HCL 4 MG PO TABS: 4.0000 mg | ORAL_TABLET | Freq: Three times a day (TID) | ORAL | 0 refills | Status: DC | PRN

## 2023-05-13 NOTE — Progress Notes (Signed)
At a Glance:  Vitals Temp: (!) 97.5 F (36.4 C) BP: (!) 170/91 Pulse Rate: 78 SpO2: 96 %   Anthropometric Measurements Height: 5\' 11"  (1.803 m) Weight: (!) 354 lb 15.1 oz (161 kg) BMI (Calculated): 49.53   No data recorded Other Clinical Data RMR: 2707 Fasting: yes Labs: yes Today's Visit #: 1 Starting Date: 05/13/23    EKG: Normal sinus rhythm: 03/12/2023 ( in the hospital )    Indirect Calorimeter:   Resting Metabolic Rate ( RMR):  RMR (actual): 2707 kcal RMR (calculated): no calculated, patient cannot stand.   Plan:   Indirect calorimeter completed, interpreted and reviewed with patient today and allowed to ask questions.  Discussed the implications for the chosen plan and exercise based on the RMR reading.  Will consider repeating the RMR in the future based on weight loss.    Chief Complaint:  Obesity   Subjective:  Raymond [name] (MR# [id number]) is a 57 y.o. male who presents for evaluation and treatment of obesity and related comorbidities.   Raymond [name] is currently in the action stage of change and ready to dedicate time achieving and maintaining a healthier weight. Raymond [name] is interested in becoming our patient and working on intensive lifestyle modifications including (but not limited to) diet and exercise for weight loss.  Mase has been struggling with his weight. He has been unsuccessful in either losing weight, maintaining weight loss, or reaching his healthy weight goal.  Raymond [name]'s habits were reviewed today and are as follows: His family eats meals together, he thinks his family will eat healthier with him, he has been heavy most of his life, his heaviest weight ever was 400 lbs pounds, he has significant food cravings issues, he snacks frequently in the evenings, he skips meals frequently, he frequently makes poor food choices, and he frequently eats larger portions than normal.   He started gaining weight at age 32 to 4. Raymond [name] He has dealt with weight  issues for most of his life.   Current or previous pharmacotherapy: Other: B 12 taking the Mounjaro  Response to medication:  doing well but occasional nausea.   Other Fatigue Khayden admits to daytime somnolence and admits to waking up still tired. Patient has a history of symptoms of daytime fatigue. Raymond [name] generally gets 3 or 4 hours of sleep per night, and states that he has difficulty falling back asleep if awakened. Snoring is present. Apneic episodes is present. Epworth Sleepiness Score is 1.   Shortness of Breath Raymond [name] notes increasing shortness of breath with exercising and seems to be worsening over time with weight gain. He notes getting out of breath sooner with activity than he used to. This has not gotten worse recently. Raymond [name] denies shortness of breath at rest or orthopnea.  Depression Screen Raymond [name]'s Food and Mood (modified PHQ-9) score was 5. 5-9 mild depression      No data to display           Assessment and Plan:   Other Fatigue Kaizen does feel that his weight is causing his energy to be lower than it should be. Fatigue Robar be related to obesity, depression or many other causes. Labs will be ordered, and in the meanwhile, Lenix will focus on self care including making healthy food choices, increasing physical activity and focusing on stress reduction.  Shortness of Breath [name] does feel that he gets out of breath more easily that he used to when he exercises. [name]'s shortness of breath appears to be  obesity related and exercise induced. He has agreed to work on weight loss and gradually increase exercise to treat his exercise induced shortness of breath. Will continue to monitor closely.  Health Maintenance:   Obesity   Plan: Will do EKG, indirect calorimetry, and labs.     Vitamin D Deficiency Vitamin D is not at goal of 50.  Most recent vitamin D level was 13.90. He is at risk for vitamin D deficiency due to obesity.  He is not on any vitamin D.  Lab  Results  Component Value Date   VD25OH 13.90 (L) 12/29/2022    Plan: Will check for vitamin D deficiency.   Raymond [name] had a positive depression screening. Depression is commonly associated with obesity and often results in emotional eating behaviors. We will monitor this closely and work on CBT to help improve the non-hunger eating patterns. Referral to Psychology Raymond [name] be required if no improvement is seen as he continues in our clinic.   Hyperlipidemia LDL: no results  Medication(s): Lipitor  Cardiovascular risk factors: advanced age (older than 3 for men, 43 for women), diabetes mellitus, dyslipidemia, hypertension, male gender, obesity (BMI >= 30 kg/m2), and sedentary lifestyle  No results found for: "CHOL", "HDL", "LDLCALC", "LDLDIRECT", "TRIG", "CHOLHDL" Lab Results  Component Value Date   ALT 21 03/24/2023   AST 16 03/24/2023   ALKPHOS 79 03/24/2023   BILITOT 0.4 03/24/2023   The ASCVD Risk score (Arnett DK, et al., 2019) failed to calculate for the following reasons:   Cannot find a previous HDL lab   Cannot find a previous total cholesterol lab  Plan:  Continue statin.  Information sheet on healthy vs unhealthy fats.  Will avoid all trans fats.  Will read labels Will minimize saturated fats except the following: low fat meats in moderation, diary, and limited dark chocolate.    Hypertension Hypertension poorly controlled.  Medication(s): Coreg 25 mg twice daily  and Cozaar 50 mg daily , Cardura 2 mg.   BP Readings from Last 3 Encounters:  05/13/23 (!) 170/91  05/12/23 (!) 179/95  04/28/23 (!) 172/99   Lab Results  Component Value Date   CREATININE 1.82 (H) 04/10/2023   CREATININE 1.53 (H) 04/09/2023   CREATININE 1.62 (H) 04/08/2023   Lab Results  Component Value Date   GFR 92.19 10/05/2014    Plan: Continue all antihypertensives at current dosages. No added salt. Will keep sodium content to 1,500 mg or less per day.    Type II Diabetes HgbA1c is at goal.  Last A1c was 6.0 Episodes of hypoglycemia: no Medication(s): Mounjaro 10 mg SQ weekly  Lab Results  Component Value Date   HGBA1C 6.0 (H) 03/12/2023   HGBA1C 6.3 (H) 12/25/2022   Lab Results  Component Value Date   CREATININE 1.82 (H) 04/10/2023   Lab Results  Component Value Date   GFR 92.19 10/05/2014    Plan: Continue Mounjaro 10 mg SQ weekly Continue all other medications.  Will keep all carbohydrates low both sweets and starches.  Will continue exercise regimen to 30 to 60 minutes on most days of the week.  Aim for 7 to 9 hours of sleep nightly.  Eat more low glycemic index foods.   Previous labs reviewed today. Date: 04/10/2023 CMP and glucose  Labs done today CMP, Lipids, Insulin, HgbA1c, Vit D, and Thyroid Panel   Morbid Obesity: BMI (Calculated): 49.53   Tannen is currently in the action stage of change and his goal is to begin weight loss  efforts. I recommend Kentre begin the structured treatment plan as follows:  He has agreed to Category 4 Plan  Exercise goals: All adults should avoid inactivity. Some activity is better than none, and adults who participate in any amount of physical activity, gain some health benefits.  Behavioral modification strategies:increasing lean protein intake, decreasing simple carbohydrates, increasing vegetables, increase H2O intake, decreasing sodium intake, no skipping meals, keeping healthy foods in the home, better snacking choices, and avoiding temptations  He was informed of the importance of frequent follow-up visits to maximize his success with intensive lifestyle modifications for his multiple health conditions. He was informed we would discuss his lab results at his next visit unless there is a critical issue that needs to be addressed sooner. Wylie agreed to keep his next visit at the agreed upon time to discuss these results.  Objective:  General: Cooperative, alert, well developed, in no acute distress. HEENT:  Conjunctivae and lids unremarkable. Cardiovascular: Regular rhythm.  Lungs: Normal work of breathing. Neurologic: No focal deficits.   Lab Results  Component Value Date   CREATININE 1.82 (H) 04/10/2023   BUN 23 (H) 04/10/2023   NA 137 04/10/2023   K 4.2 04/10/2023   CL 101 04/10/2023   CO2 23 04/10/2023   Lab Results  Component Value Date   ALT 21 03/24/2023   AST 16 03/24/2023   ALKPHOS 79 03/24/2023   BILITOT 0.4 03/24/2023   Lab Results  Component Value Date   HGBA1C 6.0 (H) 03/12/2023   HGBA1C 6.3 (H) 12/25/2022   No results found for: "INSULIN" Lab Results  Component Value Date   TSH 1.41 10/05/2014   No results found for: "CHOL", "HDL", "LDLCALC", "LDLDIRECT", "TRIG", "CHOLHDL" Lab Results  Component Value Date   WBC 9.4 04/06/2023   HGB 11.0 (L) 04/06/2023   HCT 33.7 (L) 04/06/2023   MCV 88.2 04/06/2023   PLT 316 04/06/2023   No results found for: "IRON", "TIBC", "FERRITIN"  Attestation Statements:  Applicable history such as the following:  allergies, medications, problem list, medical history, surgical history, family history, social history, and previous encounter notes reviewed by clinician on day of visit:   Time spent on visit including the items listed below was 57 minutes.  -preparing to see the patient (e.g., review of tests, history, previous notes) -obtaining and/or reviewing separately obtained history -counseling and educating the patient/family/caregiver -documenting clinical information in the electronic or other health record -ordering medications, tests, or procedures -independently interpreting results and communicating results to the patient/ family/caregiver -referring and communicating with other health care professionals  -care coordination   This [place] have been prepared with the assistance of Engineer, civil (consulting).  Occasional wrong-word or sound-a-like substitutions Bor[place] have occurred due to the inherent limitations of voice  recognition software.    Corinna Capra, DO

## 2023-05-14 ENCOUNTER — Ambulatory Visit: Payer: Managed Care, Other (non HMO) | Admitting: Occupational Therapy

## 2023-05-14 ENCOUNTER — Encounter: Payer: Self-pay | Admitting: Physical Therapy

## 2023-05-14 ENCOUNTER — Encounter: Payer: Self-pay | Admitting: Bariatrics

## 2023-05-14 ENCOUNTER — Ambulatory Visit: Payer: Managed Care, Other (non HMO) | Admitting: Physical Therapy

## 2023-05-14 DIAGNOSIS — M6281 Muscle weakness (generalized): Secondary | ICD-10-CM

## 2023-05-14 DIAGNOSIS — E559 Vitamin D deficiency, unspecified: Secondary | ICD-10-CM | POA: Insufficient documentation

## 2023-05-14 DIAGNOSIS — R29898 Other symptoms and signs involving the musculoskeletal system: Secondary | ICD-10-CM

## 2023-05-14 DIAGNOSIS — G8929 Other chronic pain: Secondary | ICD-10-CM

## 2023-05-14 DIAGNOSIS — M25661 Stiffness of right knee, not elsewhere classified: Secondary | ICD-10-CM

## 2023-05-14 DIAGNOSIS — M25662 Stiffness of left knee, not elsewhere classified: Secondary | ICD-10-CM

## 2023-05-14 DIAGNOSIS — R2681 Unsteadiness on feet: Secondary | ICD-10-CM

## 2023-05-14 DIAGNOSIS — E786 Lipoprotein deficiency: Secondary | ICD-10-CM | POA: Insufficient documentation

## 2023-05-14 DIAGNOSIS — R2689 Other abnormalities of gait and mobility: Secondary | ICD-10-CM

## 2023-05-14 LAB — COMPREHENSIVE METABOLIC PANEL
ALT: 16 [IU]/L (ref 0–44)
AST: 15 [IU]/L (ref 0–40)
Albumin: 3.9 g/dL (ref 3.8–4.9)
Alkaline Phosphatase: 127 [IU]/L — ABNORMAL HIGH (ref 44–121)
BUN/Creatinine Ratio: 15 (ref 9–20)
BUN: 20 mg/dL (ref 6–24)
Bilirubin Total: <0.2 mg/dL (ref 0.0–1.2)
CO2: 27 mmol/L (ref 20–29)
Calcium: 9.6 mg/dL (ref 8.7–10.2)
Chloride: 98 mmol/L (ref 96–106)
Creatinine, Ser: 1.36 mg/dL — ABNORMAL HIGH (ref 0.76–1.27)
Globulin, Total: 3.4 g/dL (ref 1.5–4.5)
Glucose: 119 mg/dL — ABNORMAL HIGH (ref 70–99)
Potassium: 4 mmol/L (ref 3.5–5.2)
Sodium: 140 mmol/L (ref 134–144)
Total Protein: 7.3 g/dL (ref 6.0–8.5)
eGFR: 60 mL/min/{1.73_m2} (ref >59)

## 2023-05-14 LAB — TSH+T4F+T3FREE
Free T4: 1.4 ng/dL (ref 0.82–1.77)
T3, Free: 2.5 pg/mL (ref 2.0–4.4)
TSH: 2.71 u[IU]/mL (ref 0.450–4.500)

## 2023-05-14 LAB — VITAMIN D 25 HYDROXY (VIT D DEFICIENCY, FRACTURES): Vit D, 25-Hydroxy: 18.8 ng/mL — ABNORMAL LOW (ref 30.0–100.0)

## 2023-05-14 LAB — LIPID PANEL WITH LDL/HDL RATIO
Cholesterol, Total: 139 mg/dL (ref 100–199)
HDL: 36 mg/dL — ABNORMAL LOW (ref >39)
LDL Chol Calc (NIH): 77 mg/dL (ref 0–99)
LDL/HDL Ratio: 2.1 {ratio} (ref 0.0–3.6)
Triglycerides: 145 mg/dL (ref 0–149)
VLDL Cholesterol Cal: 26 mg/dL (ref 5–40)

## 2023-05-14 LAB — INSULIN, RANDOM: INSULIN: 23.7 u[IU]/mL (ref 2.6–24.9)

## 2023-05-14 NOTE — Therapy (Signed)
OUTPATIENT PHYSICAL THERAPY CERVICAL/THORACIC TREATMENT   Patient Name: Raymond [name] MRN: [id number] DOB:1966-05-05, 57 y.o., male Today's Date: 05/14/2023  END OF SESSION:  PT End of Session - 05/14/23 1224     Visit Number 6    Date for PT Re-Evaluation 06/16/23    PT Start Time 0845    PT Stop Time 0925    PT Time Calculation (min) 40 min             Past Medical History:  Diagnosis Date   Anemia    Anxiety    Back pain    COPD (chronic obstructive pulmonary disease) (HCC)    Depression    Diabetes mellitus without complication (HCC)    Diverticulosis    with hx of LGIB   Edema    GERD (gastroesophageal reflux disease)    GIB (gastrointestinal bleeding)    Gout    Heavy smoker    Hyperlipidemia    Hypertension    Iron deficiency anemia    Sleep apnea    uses a cpap   Swelling of lower extremity    Past Surgical History:  Procedure Laterality Date   CHONDROPLASTY Left 06/28/2014   Procedure: CHONDROPLASTY;  Surgeon: Thera Flake., MD;  Location: [facility];  Service: Orthopedics;  Laterality: Left;   COLONOSCOPY     FOOT FASCIOTOMY     age 12-rt   KNEE ARTHROSCOPY WITH LATERAL MENISECTOMY Left 06/28/2014   Procedure: KNEE ARTHROSCOPY WITH LATERAL MENISECTOMY;  Surgeon: Thera Flake., MD;  Location: [facility];  Service: Orthopedics;  Laterality: Left;   KNEE ARTHROSCOPY WITH MEDIAL MENISECTOMY Left 06/28/2014   Procedure: LEFT KNEE ARTHROSCOPY CHONDROPLASTY/WITH MEDIAL/LATERAL MENISECTOMIES;  Surgeon: Thera Flake., MD;  Location: [facility];  Service: Orthopedics;  Laterality: Left;   ORIF RADIUS & ULNA FRACTURES  2007   left   THORACIC DISCECTOMY N/A 03/12/2023   Procedure: THORACIC LAMINECTOMY AND  DISCECTOMY;  Surgeon: Coletta Memos, MD;  Location: [facility];  Service: Neurosurgery;  Laterality: N/A;   Patient Active Problem List   Diagnosis Date Noted   Vitamin D deficiency 05/14/2023   Low HDL (under 40)  05/14/2023   Acute on chronic anemia 04/13/2023   Acute kidney injury superimposed on chronic kidney disease (HCC) 04/13/2023   GERD (gastroesophageal reflux disease) 04/13/2023   Constipation 04/13/2023   Chronic back pain 04/13/2023   DVT, bilateral lower limbs (HCC) 04/13/2023   Coping style affecting medical condition 03/27/2023   Thoracic myelopathy 03/23/2023   HNP (herniated nucleus pulposus with myelopathy), thoracic 03/12/2023   Hyperlipidemia 12/24/2022   Failure to thrive in adult 12/24/2022   Tobacco abuse 03/18/2018   BPH associated with nocturia 03/11/2017   History of substance abuse (HCC) 03/11/2017   Restless leg syndrome 03/11/2017   Restrictive lung disease 05/14/2016   OSA on CPAP 05/14/2016   Moderate COPD (chronic obstructive pulmonary disease) (HCC) 05/14/2016   Smoking greater than 40 pack years 05/14/2016   SOB (shortness of breath) 10/06/2014   Edema of extremities 10/06/2014   Benign essential HTN 10/06/2014   Type 2 diabetes mellitus, without long-term current use of insulin (HCC) 07/18/2011    PCP: Venetia Constable, MD  REFERRING PROVIDER:  Charlton Amor, PA-C   REFERRING DIAG: [phone] (ICD-10-CM) - Other spondylosis with myelopathy, thoracic region   THERAPY DIAG:  Muscle weakness (generalized)  Unsteadiness on feet  Chronic pain of left knee  Stiffness of  left knee, not elsewhere classified  Chronic pain of right knee  Stiffness of right knee, not elsewhere classified  Rationale for Evaluation and Treatment: Rehabilitation  ONSET DATE: October 2024  SUBJECTIVE:                                                                                                                                                                                                         SUBJECTIVE STATEMENT: Pt reports no new changes.  From eval: Pt states he fell, broke his ankle and was found to later have a ruptured disc and had to get back  surgery. Went to inpatient rehab x 3 weeks. Felt like he built up his upper body. Has now been home x 1 week. States he has not been doing much since then -- has done some sitting exercises. Was able to stand for 3 min bouts for a total of about 12 min using RW at inpatient rehab. Has history of bad knees and will be getting gel shots. 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 He was found to have large HNP T10/T11 and underwent T10 laminectomy with discectomy by Dr. Franky Macho on 03/12/2023   PAIN:  Are you having pain? No  PRECAUTIONS: None  RED FLAGS: None     WEIGHT BEARING RESTRICTIONS: No  FALLS:  Has patient fallen in last 6 months? Yes. Number of falls 1 -- last fall Oct 2024  LIVING ENVIRONMENT: Lives with: lives with their spouse Lives in: House/apartment Stairs:  ramp Has following equipment at home: Environmental consultant - 2 wheeled, Wheelchair (manual), and upright walker, hospital bed  OCCUPATION: Retired  PLOF:  Able to do his own ADLs but easier to have wife assist him  PATIENT GOALS: Walk with the walker at least household distances  NEXT MD VISIT: 04/21/23 [place]  OBJECTIVE:  Note: Objective measures were completed at Evaluation unless otherwise noted.  DIAGNOSTIC FINDINGS:  03/12/23 MRI THORACIC SPINE IMPRESSION:   1. Moderate-sized lobulated left subarticular to foraminal disc protrusion at T10-11 with resultant severe spinal stenosis. Cord is compressed and deviated to the right at this level. Associated cord signal changes concerning for edema and/or myelomalacia. 2. No other acute abnormality within the thoracic spine. 3. Degenerative spondylosis at T8-9 through T11-12  without significant spinal stenosis. Associated moderate to severe  bilateral foraminal narrowing at these levels as above.  MRI LUMBAR SPINE IMPRESSION:   1. No acute abnormality within the lumbar spine. 2. Multifactorial degenerative changes at L4-5 with resultant mild canal with severe left and moderate right lateral recess stenosis, with severe bilateral L4 foraminal narrowing. 3. Additional mild noncompressive disc bulging and facet hypertrophy elsewhere within the lumbar spine as above. No other significant stenosis or frank neural impingement.   Critical Value/emergent results were called by telephone at the time of interpretation on 03/12/2023 at 12:41 am to provider Dr. Madilyn Hook, who verbally acknowledged these results.  PATIENT SURVEYS:  FOTO 42; predicted 76  COGNITION: Overall cognitive status: Within functional limits for tasks assessed  SENSATION: WFL  POSTURE: rounded shoulders and forward head  PALPATION: No overt tenderness to palpation   CERVICAL ROM: WNL  LOWER EXTREMITY MMT:    MMT Right eval Left eval  Hip flexion 3+ 3  Hip extension 3+ 3+  Hip abduction 3+ 3+  Hip adduction    Hip internal rotation    Hip external rotation    Knee flexion 4- 3+  Knee extension 4- 3+  Ankle dorsiflexion    Ankle plantarflexion    Ankle inversion    Ankle eversion     (Blank rows = not tested)   FUNCTIONAL TESTS:  Transfers:  Chair to bed: Multiple seated scoots with UEs  Sit<>stand: max A -- limited due to knee pain Supine to sit<>sit: min A for trunk and LE negotiation. Not performing log rolling  TODAY'S TREATMENT:                                                                                                                              DATE:  05/14/23 Transfer to mat, Challenging for level transfer, but I Sit to Supine Mi, improved ability to lift legs to surface Supine with legs over G physioball- bridge, DKTC, quick side to side rotations,  alternating lifting one leg off ball to mat and back up, 10 reps each. Supine to long sit, then scoot hips forward to the edge of mat, very challenging, but able to accomplish with VC from therapist for correct technique, lean and scoot. Fitter press, 1 x 10 with 1 blue band, then 1 x 5 with 2 blue bands, focus Standing in parallel bars from elevated W/C seat. Pushes up from bars with min A and extra time, still largely reliant on UE support, but trying to move and relax arms to rely on his legs more. Stood 3 x 30 sec, CGA once up with close guarding at knees in case of buckling.  05/11/23 W/C > mat scoot transfer with S. Difficulty getting started, but completed without physical A. Moved into longsit on mat, then scooted over and backwards. Performed multiple movements on the mat in order to facilitate active trunk stability, including prone lying, attempted to move to quadruped, but his knees kept slipping back as he tried to push  up. Rolled to R and performed L hip abd, repeated on L sidelie for R hip. Sup to sit on edge of mat with much less effort required today, S Sit<> partial stand from elevated mat with hands on chair in front of him, able to come to stand with hands on seat, then place hands on the armrests of the chair. 3 reps. Sit to stand with mod A to initiate and given extra time to rise. Parallel bars- Sit to stand from W/C with Airex pad in seat to elevate slightly. Pushed up on parallel bars. Able to achieve fully upright stance and perform lat weight shifts, but had difficulty once he tried to lift either foot to simulate taking a step, poor control of limb in space.  05/07/23 Sliding board transfer to mat using lean technique with shoulders away from hips to initiate. He then was able to lift his hips and scoot the rest of the way. Supine bridging x 10, able to clear hips Repeated roll to each side x 3 using trunk into flexion. Fitter press with 2 blue bands x 10 each leg, very  challenging. Functional trunk and LE strengthening, seated on mat, turn to R, walking both hands away from trunk, then lifting L LE to touch heel to surface of mat. X 5 lifts to each side, very challenging. Sit<> partial stand from elevated mat with hands on chair in front of him, able to come to stand with hands on seat, then place hands on the armrests of the chair. Moved to parallel bars with Air ex pad on top of his W/C cushion to elevate him. Sit > Stand pulling bars, with mod A and time to push into full upright posture where he stood x approx 30 seconds. Relies on arms to A.  05/06/23 Lateral transfers from W/C <> mat with min A. Sit to supine with light min A to BLE Rolled to L- Therapist facilitated lower trunk active mobilization in ant/post and inf/sup planes of movement. Moved to R LE with acitve hip abd, very weak, tends to turn and use quads, clamshell also required mod A. Facilitated repeated rolling using normalized technique with RLE and UE held in the air to engage trunk. Very challenging, but able to complete x 4. Repeated all activities in R side lying. Moved from Sup > long sit I, but did report some strain in back Sit <> partial stand from elevated mat, hands on the seat or armrests of a chair facing him, required +2 to initiate due to B knee pain, but he was able to rise into bent over standing position x5 and hold for up to 5 seconds. B scoot transfers, emphasizing lifting hips off the surface by pushing through legs, x 5 each way on mat Transfer back to W/C with CGA. Mat > W/C with CGA  04/23/23 Seated heel slide 2x10 Transfer x2; min A to stabilize w/c while getting to bed. Pt utilizing multiple scoots and UEs SLR short range x10 (>10 deg extensor lag bilat) Quad set 2x10 SAQ 2# ankle weight 2x10 Supine clamshell green TB 2x10 Bridge 2x10 Supine to sit log roll with SBA for v/cs In // bars pulling up for 1/4 to standing 2x5  In w/c pushing with legs only 2x10' In  w/c pulling with legs only 2x10'  04/21/23 See HEP below   PATIENT EDUCATION:  Education details: Exam findings, POC, initial HEP Person educated: Patient Education method: Explanation, Demonstration, and Handouts Education comprehension: verbalized understanding, returned demonstration,  and needs further education  HOME EXERCISE PROGRAM: Access Code: [email or web address] URL: [email or web address] Date: 04/21/2023 Prepared by: Vernon Prey April Kirstie Peri  Exercises - Staggered Bridge  - 1 x daily - 7 x weekly - 2 sets - 10 reps - Hooklying Isometric Clamshell  - 1 x daily - 7 x weekly - 2 sets - 10 reps - Small Range Straight Leg Raise  - 1 x daily - 7 x weekly - 2 sets - 10 reps  ASSESSMENT:  CLINICAL IMPRESSION: Patient demonstrates improved strength in legs, able to lift them both onto the mat I during sit to supine. Continued to facilitate trunk control and stability, progressing to LE strength and finish with standing in paralell bars for functional strengthening.  From eval: Patient is a 57 y.o. M who was seen today for physical therapy evaluation and treatment for thoracic myelopathy. PMH significant for bilat knee OA (pt to get gel shots and is interested in future TKA once he gets his weight down) and T10 laminectomy with discectomy by Dr. Franky Macho on 03/12/2023. Went to inpatient rehab x 3 weeks. Has now been at home x 1 week. Pt states he does not currently have any known precautions. Assessment significant for gross bilat LE weakness (L LE weaker than R) affecting standing tolerance, transfers and balance. Pt is limited due to his knee pain. Pt will highly benefit from PT to address these deficits to reach pt's goals for increased mobility and amb.   OBJECTIVE IMPAIRMENTS: Abnormal gait, decreased balance, decreased endurance, decreased mobility, difficulty walking, decreased ROM, decreased strength, increased edema, improper body mechanics, postural dysfunction, obesity, and  pain.   GOALS: Goals reviewed with patient? Yes  SHORT TERM GOALS: Target date: 06/02/2023   Pt will be ind with initial HEP Baseline:  Goal status: 05/06/23- provided, but inconsistent in performing. Ongoing  2.  Pt will be able to tolerate standing x 5 min to brush his teeth Baseline:  Goal status: INITIAL  3.  Pt will be able to perform stand pivot t/f with RW min a Baseline:  Goal status: 05/06/23- scooting transfers with min a. Ongoing   LONG TERM GOALS: Target date: 07/14/2023   Pt will be ind with management and progression of HEP Baseline:  Goal status: INITIAL  2.  Pt will be able to amb at least 20' with RW min A for home mobility Baseline:  Goal status: INITIAL  3.  Pt will be able to perform 5x STS to demo increased functional LE strength Baseline: Unable to perform complete STS without max A Goal status: INITIAL  4.  Pt will have increased FOTO to >/=54 Baseline:  Goal status: INITIAL  PLAN:  PT FREQUENCY: 2x/week  PT DURATION: 12 weeks  PLANNED INTERVENTIONS: 97164- PT Re-evaluation, 97110-Therapeutic exercises, 97530- Therapeutic activity, [id number]- Neuromuscular re-education, 97535- Self Care, [id number]- Manual therapy, [id number]- Gait training, [phone]- Orthotic Fit/training, [phone]- Aquatic Therapy, 97014- Electrical stimulation (unattended), [id number]- Ultrasound, 24401- Ionotophoresis 4mg /ml Dexamethasone, Patient/Family education, Balance training, Stair training, Taping, Dry Needling, Joint mobilization, Spinal mobilization, Cryotherapy, and Moist heat  PLAN FOR NEXT SESSION: Assess response to HEP. Continue to work on LE strengthening and transfers/standing.    Iona Beard, DPT 05/14/2023, 12:25 PM

## 2023-05-14 NOTE — Therapy (Signed)
OUTPATIENT OCCUPATIONAL THERAPY NEURO Treatment  Patient Name: Raymond [name] MRN: [id number] DOB:Mar 23, 1966, 57 y.o., male Today's Date: 05/14/2023  PCP: Dr. Lyn Hollingshead REFERRING PROVIDER: Dr. Carlis Abbott  END OF SESSION:     Past Medical History:  Diagnosis Date   Anemia    Anxiety    Back pain    COPD (chronic obstructive pulmonary disease) (HCC)    Depression    Diabetes mellitus without complication (HCC)    Diverticulosis    with hx of LGIB   Edema    GERD (gastroesophageal reflux disease)    GIB (gastrointestinal bleeding)    Gout    Heavy smoker    Hyperlipidemia    Hypertension    Iron deficiency anemia    Sleep apnea    uses a cpap   Swelling of lower extremity    Past Surgical History:  Procedure Laterality Date   CHONDROPLASTY Left 06/28/2014   Procedure: CHONDROPLASTY;  Surgeon: Thera Flake., MD;  Location: [facility];  Service: Orthopedics;  Laterality: Left;   COLONOSCOPY     FOOT FASCIOTOMY     age 65-rt   KNEE ARTHROSCOPY WITH LATERAL MENISECTOMY Left 06/28/2014   Procedure: KNEE ARTHROSCOPY WITH LATERAL MENISECTOMY;  Surgeon: Thera Flake., MD;  Location: [facility];  Service: Orthopedics;  Laterality: Left;   KNEE ARTHROSCOPY WITH MEDIAL MENISECTOMY Left 06/28/2014   Procedure: LEFT KNEE ARTHROSCOPY CHONDROPLASTY/WITH MEDIAL/LATERAL MENISECTOMIES;  Surgeon: Thera Flake., MD;  Location: [facility];  Service: Orthopedics;  Laterality: Left;   ORIF RADIUS & ULNA FRACTURES  2007   left   THORACIC DISCECTOMY N/A 03/12/2023   Procedure: THORACIC LAMINECTOMY AND  DISCECTOMY;  Surgeon: Coletta Memos, MD;  Location: [facility] OR;  Service: Neurosurgery;  Laterality: N/A;   Patient Active Problem List   Diagnosis Date Noted   Vitamin D deficiency 05/14/2023   Low HDL (under 40) 05/14/2023   Acute on chronic anemia 04/13/2023   Acute kidney injury superimposed on chronic kidney disease (HCC) 04/13/2023   GERD  (gastroesophageal reflux disease) 04/13/2023   Constipation 04/13/2023   Chronic back pain 04/13/2023   DVT, bilateral lower limbs (HCC) 04/13/2023   Coping style affecting medical condition 03/27/2023   Thoracic myelopathy 03/23/2023   HNP (herniated nucleus pulposus with myelopathy), thoracic 03/12/2023   Hyperlipidemia 12/24/2022   Failure to thrive in adult 12/24/2022   Tobacco abuse 03/18/2018   BPH associated with nocturia 03/11/2017   History of substance abuse (HCC) 03/11/2017   Restless leg syndrome 03/11/2017   Restrictive lung disease 05/14/2016   OSA on CPAP 05/14/2016   Moderate COPD (chronic obstructive pulmonary disease) (HCC) 05/14/2016   Smoking greater than 40 pack years 05/14/2016   SOB (shortness of breath) 10/06/2014   Edema of extremities 10/06/2014   Benign essential HTN 10/06/2014   Type 2 diabetes mellitus, without long-term current use of insulin (HCC) 07/18/2011    ONSET DATE: 04/08/23- referral date  REFERRING DIAG:  Diagnosis  M47.14 (ICD-10-CM) - Other spondylosis with myelopathy, thoracic region    THERAPY DIAG:  Muscle weakness (generalized)  Unsteadiness on feet  Other abnormalities of gait and mobility  Other symptoms and signs involving the musculoskeletal system  Rationale for Evaluation and Treatment: Rehabilitation  SUBJECTIVE:   SUBJECTIVE STATEMENT: Pt reports  difficulty with toileting hygine Pt accompanied by: self  PERTINENT HISTORY: 57 y.o. male presenting with increased low back pain after a fall. Pt was found to have thoracic  myelopathy due to a disc herniation at T10/11. He is now s/p T10 laminectomy and Discectomy. Pt received therapies at [facility] and he d/c home 03/23/23 PMH of anxiety, COPD, DM, Edema, Gout, HTN, HLD, L medial menisectomy, ORIF L radius and ulna.  PRECAUTIONS: Back  WEIGHT BEARING RESTRICTIONS: No  PAIN:  Are you having pain? Yes: NPRS scale: 3-4/10 Pain location: back Pain description:  aching Aggravating factors: sitting still Relieving factors: meds  FALLS: Has patient fallen in last 6 months? Yes. Number of falls 1  LIVING ENVIRONMENT: Lives with: lives with their spouse Lives in: House/apartment Stairs:  has ramp Has following equipment at home: Walker - 2 wheeled and bed side commode  PLOF: Independent with basic ADLs  PATIENT GOALS: increase I with ADLS/ADLS  OBJECTIVE:  Note: Objective measures were completed at Evaluation unless otherwise noted.  HAND DOMINANCE: Right  ADLs: Overall ADLs: increased time required, uses w/c, pt is not able to ambulate or stand for ADLs at this time Transfers/ambulation related to ADLs: Eating: mod I Grooming: mod I UB Dressing: setup LB Dressing: mod A,  Toileting: uses bedside commode supervision-min A for scooting Bathing: sponge bathing, min A per pt report Tub Shower transfers: n/a  Equipment: bed side commode Unable to get into bathroom with w/c  IADLs: dependent with IADLS   MOBILITY STATUS:  supervision to min A for transfers  -scooting from w/c, pt is unable to stand for ADLs  Activitiy tolerance: 45 mins to hour with rest break    UPPER EXTREMITY ROM:    Active ROM Right eval Left eval  Shoulder flexion 90 110  Shoulder abduction 90 100  Shoulder adduction    Shoulder extension    Shoulder internal rotation    Shoulder external rotation    Elbow flexion [facility] WFL  Elbow extension [facility] WFL  Wrist flexion    Wrist extension    Wrist ulnar deviation    Wrist radial deviation    Wrist pronation    Wrist supination  90%  (Blank rows = not tested)  UPPER EXTREMITY MMT:     MMT Right eval Left eval  Shoulder flexion 3+/5 4/5  Shoulder abduction    Shoulder adduction    Shoulder extension    Shoulder internal rotation    Shoulder external rotation    Middle trapezius    Lower trapezius    Elbow flexion 4/5 4+/5  Elbow extension 4//5 4+/5  Wrist flexion    Wrist extension    Wrist  ulnar deviation    Wrist radial deviation    Wrist pronation    Wrist supination    (Blank rows = not tested)  HAND FUNCTION: Grip strength: Right: 72 lbs; Left: 75 lbs  COORDINATION: 9 Hole Peg test: Right: 23.21 sec; Left: 32.52 sec  SENSATION: WFL      COGNITION: Overall cognitive status: Within functional limits for tasks assessed    OBSERVATIONS: Pleasant male is highly motivated to improve   TODAY'S TREATMENT:  DATE: 05/14/23-UBE x 6 mins level 3 for conditioning Pt reports difficulty with bathing his bottom and hygine. Therapist discussed with pt/ wife and provided information regarding toilet aide, handheld bidet and long handled sponge. therapist recommends pt does as much as he can then has assistnace from his wife for thoroughness. Therapist also discussed with pt/ wife activies pt can perform at home at w/c level: folding clothes, wiping kitchen counter, and retieveing a snack for the fridge. Closed cahin shoulder flexion with cane, to 90*, 20 reps min v.c Biceps curls and triceps extension 15 reps each, min v.c  05/11/23-UBE x 6 mins level 2 for conditioning Biceps curls 2 sets of 10-15 reps with 2 lbs hand weights, triceps ext with red band, min v.c 15 reps each UE Closed chain shoulder flexion with cane 15 reps to shoulder height, min v.c to avoid shoulder hike, then 15 reps with 2 lbs cane  Pt practiced scooting along edge of mat with emphasis on lifting up with arms and legs before scoot, min v.c Pt simulated donning pants with reacher and min v.c and demonstration. Pt transferred back to w/c via sliding board with supervision min-mod v.c  05/07/23 UBE x 6 mins level 3 for conditioning Pt was instructed in updates to HEP, for bilateral UE's min v.c and demonstration 10 reps each, red band and cane were used see pt instuctions.  05/06/23-  UBE x 6 mins level 2 for conditioning, seated in w/c Pt transferred scoot pivot without sliding board to mat with supervision Seated on mat closed chain shoulder flexion and biceps curls 10 reps each, min v.c  Biceps curls x 20 reps with 3lbs weight bilateral UE's, min v.c shoulder flexion bilateal UE's with 2 lbs weight in each hand, 15 reps. Pt practiced scooting on mat with cueing to shift weight fowrard and pushdown through hands to scoot, pt also practiced using this technique for a sliding board transfer back to w/c. min v.c Pt's wife rports pt an't go into the kitchen because he can't remove his leg rests. Pt practiced removing and reattaching w/c leg rests with min v.c the pt practiced walking his w/c forwards with legs while assisting aby pushing wheels. see education 04/22/23 eval  PATIENT EDUCATION: Education details:  donning pants with reacher Person educated: Patient Education method: Explanation and Verbal cues, min v.c Education comprehension: verbalized understanding, returned demonstration, v.c  HOME EXERCISE PROGRAM:  red band and vertical cane 05/07/23   GOALS: Goals reviewed with patient? Yes  SHORT TERM GOALS: Target date: 05/20/23  I with inital HEP.  Goal status: ongoing, 05/11/23  2.  Pt will pefrom LB dressing with min A  Goal status: ongoing, 05/11/23  3.  Pt will consistently perform toiltet transfers with supervision.  Goal status: ongoing, 05/11/23  4.  Pt will perform snack or beverage prep at a w/c level  Goal status: ongoing, 05/11/23  5. Pt will verbalize understanding of back precautions.  Goal status: ongoing, 05/11/23    LONG TERM GOALS: Target date: 07/15/23  I with updated HEP. Baseline:  Goal status: INITIAL  2.  Pt will perform bathing with supervision/ set up  Goal status: INITIAL  3.  Pt will perform LB dressing with supervision/ set up.  Goal status: INITIAL  4.  Pt will demonstrate ability with stand for 5 mins with min  A for ADLs/ IADLs.  Goal status: INITIAL  5.  Pt will perfrom simple home management at a walker level with min A.  Goal status: INITIAL   ASSESSMENT: Pt is progressing towards goals. Pt  verbalizes understanding of strategies for toilet hygeine and bathing his bottom. CLINICAL IMPRESSION: Pt is progressing towards. He demonstrates understanding of intial HEP. PERFORMANCE DEFICITS: in functional skills including ADLs, IADLs, ROM, strength, pain, flexibility, mobility, balance, body mechanics, endurance, decreased knowledge of precautions, decreased knowledge of use of DME, and UE functional use,  and psychosocial skills including coping strategies, environmental adaptation, habits, interpersonal interactions, and routines and behaviors.   IMPAIRMENTS: are limiting patient from ADLs, IADLs, rest and sleep, work, play, leisure, and social participation.   CO-MORBIDITIES: Mcneel have co-morbidities  that affects occupational performance. Patient will benefit from skilled OT to address above impairments and improve overall function.  MODIFICATION OR ASSISTANCE TO COMPLETE EVALUATION: No modification of tasks or assist necessary to complete an evaluation.  OT OCCUPATIONAL PROFILE AND HISTORY: Detailed assessment: Review of records and additional review of physical, cognitive, psychosocial history related to current functional performance.  CLINICAL DECISION MAKING: LOW - limited treatment options, no task modification necessary  REHAB POTENTIAL: Good  EVALUATION COMPLEXITY: Low    PLAN:  OT FREQUENCY: 2x/week  OT DURATION: 12 weeks plus eval  PLANNED INTERVENTIONS: 97168 OT Re-evaluation, 97535 self care/ADL training, 40981 therapeutic exercise, 97530 therapeutic activity, 97112 neuromuscular re-education, 97140 manual therapy, 97116 gait training, 19147 aquatic therapy, 97035 ultrasound, 97018 paraffin, 82956 moist heat, 97010 cryotherapy, passive range of motion, balance training, stair  training, functional mobility training, psychosocial skills training, energy conservation, coping strategies training, patient/family education, and DME and/or AE instructions  RECOMMENDED OTHER SERVICES: PT  CONSULTED AND AGREED WITH PLAN OF CARE: Patient  PLAN FOR NEXT SESSION:   strengthening ADLS, adaptive equipment,   Krisha Beegle, OT 05/14/2023, 10:04 AM

## 2023-05-18 ENCOUNTER — Ambulatory Visit: Payer: Managed Care, Other (non HMO) | Admitting: Occupational Therapy

## 2023-05-18 ENCOUNTER — Ambulatory Visit: Payer: Managed Care, Other (non HMO) | Admitting: Physical Therapy

## 2023-05-20 ENCOUNTER — Encounter (HOSPITAL_BASED_OUTPATIENT_CLINIC_OR_DEPARTMENT_OTHER): Payer: Self-pay

## 2023-05-20 ENCOUNTER — Inpatient Hospital Stay (HOSPITAL_BASED_OUTPATIENT_CLINIC_OR_DEPARTMENT_OTHER)
Admission: EM | Admit: 2023-05-20 | Discharge: 2023-05-25 | DRG: 392 | Disposition: A | Discharge status: Home / Self Care | Payer: Managed Care, Other (non HMO) | Attending: Family Medicine | Admitting: Family Medicine

## 2023-05-20 ENCOUNTER — Ambulatory Visit: Payer: Managed Care, Other (non HMO) | Admitting: Occupational Therapy

## 2023-05-20 ENCOUNTER — Other Ambulatory Visit: Payer: Self-pay

## 2023-05-20 ENCOUNTER — Ambulatory Visit: Payer: Managed Care, Other (non HMO) | Admitting: Physical Therapy

## 2023-05-20 ENCOUNTER — Encounter: Payer: Self-pay | Admitting: Physical Therapy

## 2023-05-20 DIAGNOSIS — E119 Type 2 diabetes mellitus without complications: Secondary | ICD-10-CM

## 2023-05-20 DIAGNOSIS — M62830 Muscle spasm of back: Secondary | ICD-10-CM | POA: Diagnosis present

## 2023-05-20 DIAGNOSIS — I82403 Acute embolism and thrombosis of unspecified deep veins of lower extremity, bilateral: Secondary | ICD-10-CM | POA: Diagnosis present

## 2023-05-20 DIAGNOSIS — Z818 Family history of other mental and behavioral disorders: Secondary | ICD-10-CM

## 2023-05-20 DIAGNOSIS — F32A Depression, unspecified: Secondary | ICD-10-CM | POA: Diagnosis present

## 2023-05-20 DIAGNOSIS — F419 Anxiety disorder, unspecified: Secondary | ICD-10-CM | POA: Diagnosis present

## 2023-05-20 DIAGNOSIS — R319 Hematuria, unspecified: Secondary | ICD-10-CM | POA: Diagnosis present

## 2023-05-20 DIAGNOSIS — R2681 Unsteadiness on feet: Secondary | ICD-10-CM

## 2023-05-20 DIAGNOSIS — G4733 Obstructive sleep apnea (adult) (pediatric): Secondary | ICD-10-CM

## 2023-05-20 DIAGNOSIS — I129 Hypertensive chronic kidney disease with stage 1 through stage 4 chronic kidney disease, or unspecified chronic kidney disease: Secondary | ICD-10-CM | POA: Diagnosis present

## 2023-05-20 DIAGNOSIS — M6281 Muscle weakness (generalized): Secondary | ICD-10-CM

## 2023-05-20 DIAGNOSIS — M549 Dorsalgia, unspecified: Secondary | ICD-10-CM | POA: Diagnosis present

## 2023-05-20 DIAGNOSIS — Z833 Family history of diabetes mellitus: Secondary | ICD-10-CM

## 2023-05-20 DIAGNOSIS — K5732 Diverticulitis of large intestine without perforation or abscess without bleeding: Principal | ICD-10-CM | POA: Diagnosis present

## 2023-05-20 DIAGNOSIS — G8929 Other chronic pain: Secondary | ICD-10-CM

## 2023-05-20 DIAGNOSIS — Z993 Dependence on wheelchair: Secondary | ICD-10-CM

## 2023-05-20 DIAGNOSIS — N3090 Cystitis, unspecified without hematuria: Secondary | ICD-10-CM | POA: Diagnosis present

## 2023-05-20 DIAGNOSIS — N189 Chronic kidney disease, unspecified: Secondary | ICD-10-CM | POA: Diagnosis present

## 2023-05-20 DIAGNOSIS — N390 Urinary tract infection, site not specified: Secondary | ICD-10-CM | POA: Diagnosis not present

## 2023-05-20 DIAGNOSIS — N401 Enlarged prostate with lower urinary tract symptoms: Secondary | ICD-10-CM | POA: Diagnosis present

## 2023-05-20 DIAGNOSIS — S82409A Unspecified fracture of shaft of unspecified fibula, initial encounter for closed fracture: Secondary | ICD-10-CM | POA: Diagnosis present

## 2023-05-20 DIAGNOSIS — N179 Acute kidney failure, unspecified: Secondary | ICD-10-CM | POA: Diagnosis present

## 2023-05-20 DIAGNOSIS — Z79899 Other long term (current) drug therapy: Secondary | ICD-10-CM

## 2023-05-20 DIAGNOSIS — Z8249 Family history of ischemic heart disease and other diseases of the circulatory system: Secondary | ICD-10-CM

## 2023-05-20 DIAGNOSIS — R531 Weakness: Secondary | ICD-10-CM

## 2023-05-20 DIAGNOSIS — E66813 Obesity, class 3: Secondary | ICD-10-CM | POA: Diagnosis present

## 2023-05-20 DIAGNOSIS — Z882 Allergy status to sulfonamides status: Secondary | ICD-10-CM

## 2023-05-20 DIAGNOSIS — I10 Essential (primary) hypertension: Secondary | ICD-10-CM | POA: Diagnosis present

## 2023-05-20 DIAGNOSIS — K5792 Diverticulitis of intestine, part unspecified, without perforation or abscess without bleeding: Principal | ICD-10-CM

## 2023-05-20 DIAGNOSIS — M4714 Other spondylosis with myelopathy, thoracic region: Secondary | ICD-10-CM | POA: Diagnosis present

## 2023-05-20 DIAGNOSIS — F17210 Nicotine dependence, cigarettes, uncomplicated: Secondary | ICD-10-CM | POA: Diagnosis present

## 2023-05-20 DIAGNOSIS — R29898 Other symptoms and signs involving the musculoskeletal system: Secondary | ICD-10-CM

## 2023-05-20 DIAGNOSIS — Z83438 Family history of other disorder of lipoprotein metabolism and other lipidemia: Secondary | ICD-10-CM

## 2023-05-20 DIAGNOSIS — Z7985 Long-term (current) use of injectable non-insulin antidiabetic drugs: Secondary | ICD-10-CM

## 2023-05-20 DIAGNOSIS — R112 Nausea with vomiting, unspecified: Secondary | ICD-10-CM | POA: Diagnosis present

## 2023-05-20 DIAGNOSIS — R21 Rash and other nonspecific skin eruption: Secondary | ICD-10-CM | POA: Diagnosis present

## 2023-05-20 DIAGNOSIS — M109 Gout, unspecified: Secondary | ICD-10-CM | POA: Diagnosis present

## 2023-05-20 DIAGNOSIS — Z6841 Body Mass Index (BMI) 40.0 and over, adult: Secondary | ICD-10-CM

## 2023-05-20 DIAGNOSIS — M25662 Stiffness of left knee, not elsewhere classified: Secondary | ICD-10-CM

## 2023-05-20 DIAGNOSIS — Z86718 Personal history of other venous thrombosis and embolism: Secondary | ICD-10-CM

## 2023-05-20 DIAGNOSIS — J449 Chronic obstructive pulmonary disease, unspecified: Secondary | ICD-10-CM | POA: Diagnosis present

## 2023-05-20 DIAGNOSIS — E785 Hyperlipidemia, unspecified: Secondary | ICD-10-CM | POA: Diagnosis present

## 2023-05-20 DIAGNOSIS — E1122 Type 2 diabetes mellitus with diabetic chronic kidney disease: Secondary | ICD-10-CM | POA: Diagnosis present

## 2023-05-20 DIAGNOSIS — K219 Gastro-esophageal reflux disease without esophagitis: Secondary | ICD-10-CM | POA: Diagnosis present

## 2023-05-20 DIAGNOSIS — G2581 Restless legs syndrome: Secondary | ICD-10-CM | POA: Diagnosis present

## 2023-05-20 DIAGNOSIS — J984 Other disorders of lung: Secondary | ICD-10-CM | POA: Diagnosis present

## 2023-05-20 DIAGNOSIS — M25661 Stiffness of right knee, not elsewhere classified: Secondary | ICD-10-CM

## 2023-05-20 DIAGNOSIS — Z7901 Long term (current) use of anticoagulants: Secondary | ICD-10-CM

## 2023-05-20 DIAGNOSIS — D509 Iron deficiency anemia, unspecified: Secondary | ICD-10-CM | POA: Diagnosis present

## 2023-05-20 LAB — CBC WITH DIFFERENTIAL/PLATELET
Abs Immature Granulocytes: 0.13 10*3/uL — ABNORMAL HIGH (ref 0.00–0.07)
Basophils Absolute: 0.1 10*3/uL (ref 0.0–0.1)
Basophils Relative: 1 %
Eosinophils Absolute: 0.6 10*3/uL — ABNORMAL HIGH (ref 0.0–0.5)
Eosinophils Relative: 5 %
HCT: 34.2 % — ABNORMAL LOW (ref 39.0–52.0)
Hemoglobin: 11.1 g/dL — ABNORMAL LOW (ref 13.0–17.0)
Immature Granulocytes: 1 %
Lymphocytes Relative: 20 %
Lymphs Abs: 2.3 10*3/uL (ref 0.7–4.0)
MCH: 29.4 pg (ref 26.0–34.0)
MCHC: 32.5 g/dL (ref 30.0–36.0)
MCV: 90.5 fL (ref 80.0–100.0)
Monocytes Absolute: 0.8 10*3/uL (ref 0.1–1.0)
Monocytes Relative: 7 %
Neutro Abs: 7.8 10*3/uL — ABNORMAL HIGH (ref 1.7–7.7)
Neutrophils Relative %: 66 %
Platelets: 375 10*3/uL (ref 150–400)
RBC: 3.78 MIL/uL — ABNORMAL LOW (ref 4.22–5.81)
RDW: 15.4 % (ref 11.5–15.5)
WBC: 11.7 10*3/uL — ABNORMAL HIGH (ref 4.0–10.5)
nRBC: 0 % (ref 0.0–0.2)

## 2023-05-20 LAB — BASIC METABOLIC PANEL
Anion gap: 10 (ref 5–15)
BUN: 27 mg/dL — ABNORMAL HIGH (ref 6–20)
CO2: 23 mmol/L (ref 22–32)
Calcium: 9.2 mg/dL (ref 8.9–10.3)
Chloride: 105 mmol/L (ref 98–111)
Creatinine, Ser: 2 mg/dL — ABNORMAL HIGH (ref 0.61–1.24)
GFR, Estimated: 38 mL/min — ABNORMAL LOW (ref >60)
Glucose, Bld: 136 mg/dL — ABNORMAL HIGH (ref 70–99)
Potassium: 3.9 mmol/L (ref 3.5–5.1)
Sodium: 138 mmol/L (ref 135–145)

## 2023-05-20 LAB — URINALYSIS, ROUTINE W REFLEX MICROSCOPIC
Bilirubin Urine: NEGATIVE
Glucose, UA: NEGATIVE mg/dL
Ketones, ur: NEGATIVE mg/dL
Nitrite: NEGATIVE
Protein, ur: 30 mg/dL — AB
Specific Gravity, Urine: 1.015 (ref 1.005–1.030)
pH: 6.5 (ref 5.0–8.0)

## 2023-05-20 LAB — URINALYSIS, MICROSCOPIC (REFLEX): RBC / HPF: >50 RBC/hpf (ref 0–5)

## 2023-05-20 MED ORDER — SODIUM CHLORIDE 0.9 % IV SOLN
2.0000 g | Freq: Once | INTRAVENOUS | Status: AC
  Administered 2023-05-21: 2 g via INTRAVENOUS
  Filled 2023-05-20: qty 20

## 2023-05-20 MED ORDER — ONDANSETRON HCL 4 MG/2ML IJ SOLN
4.0000 mg | Freq: Once | INTRAMUSCULAR | Status: AC
  Administered 2023-05-21: 4 mg via INTRAVENOUS
  Filled 2023-05-20: qty 2

## 2023-05-20 MED ORDER — LIDOCAINE HCL URETHRAL/MUCOSAL 2 % EX GEL
1.0000 | Freq: Once | CUTANEOUS | Status: DC
  Filled 2023-05-20: qty 11

## 2023-05-20 NOTE — Therapy (Unsigned)
OUTPATIENT OCCUPATIONAL THERAPY NEURO Treatment  Patient Name: Raymond [name] MRN: [id number] DOB:[birth year], 57 y.o., male Today's Date: 05/21/2023  PCP: Dr. Tawanna Solo PROVIDER: Dr. Carlis Abbott  END OF SESSION:  OT End of Session - 05/21/23 1500     Visit Number 6    Number of Visits 25    Date for OT Re-Evaluation 07/15/23    Authorization Type cigna    Authorization Time Period 12 weeks    OT Start Time 1235    OT Stop Time 1310    OT Time Calculation (min) 35 min    Activity Tolerance Patient tolerated treatment well    Behavior During Therapy WFL for tasks assessed/performed               Past Medical History:  Diagnosis Date   Acute kidney injury superimposed on chronic kidney disease (HCC) 04/13/2023   Acute on chronic anemia 04/13/2023   Anemia    Anxiety    Back pain    COPD (chronic obstructive pulmonary disease) (HCC)    Depression    Diabetes mellitus without complication (HCC)    Diverticulosis    with hx of LGIB   Edema    GERD (gastroesophageal reflux disease)    GIB (gastrointestinal bleeding)    Gout    Heavy smoker    Hyperlipidemia    Hypertension    Iron deficiency anemia    Sleep apnea    uses a cpap   Swelling of lower extremity    Past Surgical History:  Procedure Laterality Date   CHONDROPLASTY Left 06/28/2014   Procedure: CHONDROPLASTY;  Surgeon: Thera Flake., MD;  Location: [facility];  Service: Orthopedics;  Laterality: Left;   COLONOSCOPY     FOOT FASCIOTOMY     age 46-rt   KNEE ARTHROSCOPY WITH LATERAL MENISECTOMY Left 06/28/2014   Procedure: KNEE ARTHROSCOPY WITH LATERAL MENISECTOMY;  Surgeon: Thera Flake., MD;  Location: [facility];  Service: Orthopedics;  Laterality: Left;   KNEE ARTHROSCOPY WITH MEDIAL MENISECTOMY Left 06/28/2014   Procedure: LEFT KNEE ARTHROSCOPY CHONDROPLASTY/WITH MEDIAL/LATERAL MENISECTOMIES;  Surgeon: Thera Flake., MD;  Location: [facility];   Service: Orthopedics;  Laterality: Left;   ORIF RADIUS & ULNA FRACTURES  2007   left   THORACIC DISCECTOMY N/A 03/12/2023   Procedure: THORACIC LAMINECTOMY AND  DISCECTOMY;  Surgeon: Coletta Memos, MD;  Location: [facility] OR;  Service: Neurosurgery;  Laterality: N/A;   Patient Active Problem List   Diagnosis Date Noted   Cystitis 05/21/2023   Acute diverticulitis 05/21/2023   Vitamin D deficiency 05/14/2023   Low HDL (under 40) 05/14/2023   GERD (gastroesophageal reflux disease) 04/13/2023   Constipation 04/13/2023   Chronic back pain 04/13/2023   DVT, bilateral lower limbs (HCC) 04/13/2023   Coping style affecting medical condition 03/27/2023   Thoracic myelopathy 03/23/2023   HNP (herniated nucleus pulposus with myelopathy), thoracic 03/12/2023   Hyperlipidemia 12/24/2022   Failure to thrive in adult 12/24/2022   Tobacco abuse 03/18/2018   BPH associated with nocturia 03/11/2017   History of substance abuse (HCC) 03/11/2017   Restless leg syndrome 03/11/2017   Restrictive lung disease 05/14/2016   OSA on CPAP 05/14/2016   Moderate COPD (chronic obstructive pulmonary disease) (HCC) 05/14/2016   Smoking greater than 40 pack years 05/14/2016   SOB (shortness of breath) 10/06/2014   Edema of extremities 10/06/2014   Benign essential HTN 10/06/2014   Type 2 diabetes mellitus,  without long-term current use of insulin (HCC) 07/18/2011    ONSET DATE: 04/08/23- referral date  REFERRING DIAG:  Diagnosis  M47.14 (ICD-10-CM) - Other spondylosis with myelopathy, thoracic region    THERAPY DIAG:  Muscle weakness (generalized)  Other symptoms and signs involving the musculoskeletal system  Unsteadiness on feet  Rationale for Evaluation and Treatment: Rehabilitation  SUBJECTIVE:   SUBJECTIVE STATEMENT: Pt reports  difficulty with toileting hygine Pt accompanied by: self  PERTINENT HISTORY: 57 y.o. male presenting with increased low back pain after a fall. Pt was found to have  thoracic myelopathy due to a disc herniation at T10/11. He is now s/p T10 laminectomy and Discectomy. Pt received therapies at [facility] and he d/c home 03/23/23 PMH of anxiety, COPD, DM, Edema, Gout, HTN, HLD, L medial menisectomy, ORIF L radius and ulna.  PRECAUTIONS: Back  WEIGHT BEARING RESTRICTIONS: No  PAIN:  Are you having pain? Yes: NPRS scale: 8/10 Pain location: shoulder, biceps tendon Pain description: aching Aggravating factors: sitting still Relieving factors: meds  FALLS: Has patient fallen in last 6 months? Yes. Number of falls 1  LIVING ENVIRONMENT: Lives with: lives with their spouse Lives in: House/apartment Stairs:  has ramp Has following equipment at home: Walker - 2 wheeled and bed side commode  PLOF: Independent with basic ADLs  PATIENT GOALS: increase I with ADLS/ADLS  OBJECTIVE:  Note: Objective measures were completed at Evaluation unless otherwise noted.  HAND DOMINANCE: Right  ADLs: Overall ADLs: increased time required, uses w/c, pt is not able to ambulate or stand for ADLs at this time Transfers/ambulation related to ADLs: Eating: mod I Grooming: mod I UB Dressing: setup LB Dressing: mod A,  Toileting: uses bedside commode supervision-min A for scooting Bathing: sponge bathing, min A per pt report Tub Shower transfers: n/a  Equipment: bed side commode Unable to get into bathroom with w/c  IADLs: dependent with IADLS   MOBILITY STATUS:  supervision to min A for transfers  -scooting from w/c, pt is unable to stand for ADLs  Activitiy tolerance: 45 mins to hour with rest break    UPPER EXTREMITY ROM:    Active ROM Right eval Left eval  Shoulder flexion 90 110  Shoulder abduction 90 100  Shoulder adduction    Shoulder extension    Shoulder internal rotation    Shoulder external rotation    Elbow flexion [facility] WFL  Elbow extension [facility] WFL  Wrist flexion    Wrist extension    Wrist ulnar deviation    Wrist radial deviation    Wrist  pronation    Wrist supination  90%  (Blank rows = not tested)  UPPER EXTREMITY MMT:     MMT Right eval Left eval  Shoulder flexion 3+/5 4/5  Shoulder abduction    Shoulder adduction    Shoulder extension    Shoulder internal rotation    Shoulder external rotation    Middle trapezius    Lower trapezius    Elbow flexion 4/5 4+/5  Elbow extension 4//5 4+/5  Wrist flexion    Wrist extension    Wrist ulnar deviation    Wrist radial deviation    Wrist pronation    Wrist supination    (Blank rows = not tested)  HAND FUNCTION: Grip strength: Right: 72 lbs; Left: 75 lbs  COORDINATION: 9 Hole Peg test: Right: 23.21 sec; Left: 32.52 sec  SENSATION: WFL      COGNITION: Overall cognitive status: Within functional limits for tasks assessed  OBSERVATIONS: Pleasant male is highly motivated to improve   TODAY'S TREATMENT:                                                                                                                              DATE: 05/20/23- US1- mhz,  1.0 w/cm 2, 50%, x 10 mins  for L biceps tendon and biceps and left lateral/ anterior shoulder and left shoulder pain, no adverse reactions  Ice pack applied to left shoulder x 8 min  Pt reports pain was improved to 2-3 /10 at end of session  05/14/23-UBE x 6 mins level 3 for conditioning Pt reports difficulty with bathing his bottom and hygine. Therapist discussed with pt/ wife and provided information regarding toilet aide, handheld bidet and long handled sponge. therapist recommends pt does as much as he can then has assistnace from his wife for thoroughness. Therapist also discussed with pt/ wife activies pt can perform at home at w/c level: folding clothes, wiping kitchen counter, and retieveing a snack for the fridge. Closed chain shoulder flexion with cane, to 90*, 20 reps min v.c Biceps curls and triceps extension 15 reps each, min v.c  05/11/23-UBE x 6 mins level 2 for conditioning Biceps curls 2  sets of 10-15 reps with 2 lbs hand weights, triceps ext with red band, min v.c 15 reps each UE Closed chain shoulder flexion with cane 15 reps to shoulder height, min v.c to avoid shoulder hike, then 15 reps with 2 lbs cane  Pt practiced scooting along edge of mat with emphasis on lifting up with arms and legs before scoot, min v.c Pt simulated donning pants with reacher and min v.c and demonstration. Pt transferred back to w/c via sliding board with supervision min-mod v.c  05/07/23 UBE x 6 mins level 3 for conditioning Pt was instructed in updates to HEP, for bilateral UE's min v.c and demonstration 10 reps each, red band and cane were used see pt instuctions.  05/06/23- UBE x 6 mins level 2 for conditioning, seated in w/c Pt transferred scoot pivot without sliding board to mat with supervision Seated on mat closed chain shoulder flexion and biceps curls 10 reps each, min v.c  Biceps curls x 20 reps with 3lbs weight bilateral UE's, min v.c shoulder flexion bilateal UE's with 2 lbs weight in each hand, 15 reps. Pt practiced scooting on mat with cueing to shift weight fowrard and pushdown through hands to scoot, pt also practiced using this technique for a sliding board transfer back to w/c. min v.c Pt's wife rports pt an't go into the kitchen because he can't remove his leg rests. Pt practiced removing and reattaching w/c leg rests with min v.c the pt practiced walking his w/c forwards with legs while assisting aby pushing wheels. see education 04/22/23 eval  PATIENT EDUCATION: Education details:  education regarding resting his LUE, placing his UE exercises on holding, minimizing repetative use of LUE to quiet shoulder pain, ice recommended  Person educated: Patient Education method: Explanation and Verbal cues Education comprehension: verbalized understanding,  HOME EXERCISE PROGRAM:  red band and vertical cane 05/07/23   GOALS: Goals reviewed with patient? Yes  SHORT TERM GOALS:  Target date: 05/20/23  I with inital HEP.  Goal status: ongoing, 05/11/23  2.  Pt will pefrom LB dressing with min A  Goal status: ongoing, 05/11/23  3.  Pt will consistently perform toiltet transfers with supervision.  Goal status: ongoing, 05/11/23  4.  Pt will perform snack or beverage prep at a w/c level  Goal status: ongoing, 05/11/23  5. Pt will verbalize understanding of back precautions.  Goal status: ongoing, 05/11/23    LONG TERM GOALS: Target date: 07/15/23  I with updated HEP. Baseline:  Goal status: INITIAL  2.  Pt will perform bathing with supervision/ set up  Goal status: INITIAL  3.  Pt will perform LB dressing with supervision/ set up.  Goal status: INITIAL  4.  Pt will demonstrate ability with stand for 5 mins with min A for ADLs/ IADLs.  Goal status: INITIAL  5.  Pt will perfrom simple home management at a walker level with min A.  Goal status: INITIAL   ASSESSMENT: Pt is progressing towards goals. Pt reports significant shoulder pain today which limited treatment. Pt was educated to rest his LUE and to hold off on exercises at home as he Gotts have overdone things. CLINICAL IMPRESSION: Pt is progressing towards. He demonstrates understanding of intial HEP. PERFORMANCE DEFICITS: in functional skills including ADLs, IADLs, ROM, strength, pain, flexibility, mobility, balance, body mechanics, endurance, decreased knowledge of precautions, decreased knowledge of use of DME, and UE functional use,  and psychosocial skills including coping strategies, environmental adaptation, habits, interpersonal interactions, and routines and behaviors.   IMPAIRMENTS: are limiting patient from ADLs, IADLs, rest and sleep, work, play, leisure, and social participation.   CO-MORBIDITIES: [place] have co-morbidities  that affects occupational performance. Patient will benefit from skilled OT to address above impairments and improve overall function.  MODIFICATION OR ASSISTANCE  TO COMPLETE EVALUATION: No modification of tasks or assist necessary to complete an evaluation.  OT OCCUPATIONAL PROFILE AND HISTORY: Detailed assessment: Review of records and additional review of physical, cognitive, psychosocial history related to current functional performance.  CLINICAL DECISION MAKING: LOW - limited treatment options, no task modification necessary  REHAB POTENTIAL: Good  EVALUATION COMPLEXITY: Low    PLAN:  OT FREQUENCY: 2x/week  OT DURATION: 12 weeks plus eval  PLANNED INTERVENTIONS: 97168 OT Re-evaluation, 97535 self care/ADL training, 16109 therapeutic exercise, 97530 therapeutic activity, 97112 neuromuscular re-education, 97140 manual therapy, 97116 gait training, 60454 aquatic therapy, 97035 ultrasound, 97018 paraffin, 09811 moist heat, 97010 cryotherapy, passive range of motion, balance training, stair training, functional mobility training, psychosocial skills training, energy conservation, coping strategies training, patient/family education, and DME and/or AE instructions  RECOMMENDED OTHER SERVICES: PT  CONSULTED AND AGREED WITH PLAN OF CARE: Patient  PLAN FOR NEXT SESSION:    monitor pain, [place]   Azjah Pardo, OT 05/21/2023, 3:01 PM

## 2023-05-20 NOTE — ED Provider Notes (Signed)
[facility] Provider Note   CSN: [id number] Arrival date & time: 05/20/23  2056     History {Add pertinent medical, surgical, social history, OB history to HPI:1} Chief Complaint  Patient presents with   Hematuria    Burr Clementz Andalon is a 57 y.o. male.  The history is provided by the patient.  Hematuria  Ignacio Gianni Tata is a 57 y.o. male who presents to the Emergency Department complaining of *** Frank Hematuria at 6p Difficulty urinating. Pain at end of stream No clots, abdominal pain.  Three days of vomiting. 2-3 episodes daily 10/6 fall back surgery in October, discharge in nov Eliquis for ble dvt since mid nov     Home Medications Prior to Admission medications   Medication Sig Start Date End Date Taking? Authorizing Provider  acetaminophen (TYLENOL) 325 MG tablet Take 1-2 tablets (325-650 mg total) by mouth every 4 (four) hours as needed for mild pain (pain score 1-3). 05/12/23   Raulkar, Drema Pry, MD  albuterol (VENTOLIN HFA) 108 (90 Base) MCG/ACT inhaler Inhale 2 puffs into the lungs every 6 (six) hours as needed for wheezing or shortness of breath. 05/12/23   Raulkar, Drema Pry, MD  allopurinol (ZYLOPRIM) 300 MG tablet Take 1 tablet (300 mg total) by mouth daily. 05/12/23   Raulkar, Drema Pry, MD  ALPRAZolam Prudy Feeler) 0.5 MG tablet Take 1 tablet (0.5 mg total) by mouth 2 (two) times daily as needed. 05/12/23   Raulkar, Drema Pry, MD  apixaban (ELIQUIS) 5 MG TABS tablet Take 1 tablet (5 mg total) by mouth 2 (two) times daily. 05/12/23   Raulkar, Drema Pry, MD  atorvastatin (LIPITOR) 40 MG tablet Take 1 tablet (40 mg total) by mouth every evening. 05/12/23   Raulkar, Drema Pry, MD  baclofen (LIORESAL) 10 MG tablet Take 1 tablet (10 mg total) by mouth 3 (three) times daily. 05/12/23   Raulkar, Drema Pry, MD  camphor-menthol [facility]) lotion Apply topically as needed for itching. 05/12/23   Raulkar, Drema Pry, MD  carvedilol (COREG) 25  MG tablet Take 1 tablet (25 mg total) by mouth in the morning and at bedtime. 05/12/23   Raulkar, Drema Pry, MD  diclofenac Sodium (VOLTAREN) 1 % GEL Apply 4 g topically 4 (four) times daily. To bilateral knees 05/12/23   Raulkar, Drema Pry, MD  docusate sodium (COLACE) 100 MG capsule Take 1 capsule (100 mg total) by mouth 2 (two) times daily. 05/12/23   Raulkar, Drema Pry, MD  doxazosin (CARDURA) 2 MG tablet Take 1 tablet (2 mg total) by mouth at bedtime. 05/12/23   Raulkar, Drema Pry, MD  DULoxetine (CYMBALTA) 30 MG capsule Take 1 capsule (30 mg total) by mouth daily. 05/12/23   Raulkar, Drema Pry, MD  furosemide (LASIX) 20 MG tablet Take 1 tablet (20 mg total) by mouth 2 (two) times daily. Patient taking differently: Take 80 mg by mouth 2 (two) times daily. 04/10/23   Love, Evlyn Kanner, PA-C  lidocaine (LIDODERM) 5 % Place 2 patches onto the skin daily at 10 pm. Remove & Discard patch within 12 hours or as directed by MD 05/12/23   Raulkar, Drema Pry, MD  losartan (COZAAR) 50 MG tablet Take 1 tablet (50 mg total) by mouth daily. 05/12/23   Raulkar, Drema Pry, MD  melatonin 5 MG TABS Take 1 tablet (5 mg total) by mouth at bedtime as needed. 05/12/23   Horton Chin, MD  MOUNJARO 10 MG/0.5ML Pen Inject 10 mg  into the skin once a week.    [provider]  ondansetron (ZOFRAN) 4 MG tablet Take 1 tablet (4 mg total) by mouth every 8 (eight) hours as needed for nausea or vomiting. 05/13/23   Corinna Capra A, DO  oxyCODONE (OXY IR/ROXICODONE) 5 MG immediate release tablet Take 1-2 tablets (5-10 mg total) by mouth every 4 (four) hours as needed for severe pain (pain score 7-10). 04/09/23   Love, Evlyn Kanner, PA-C  pantoprazole (PROTONIX) 40 MG tablet Take 2 tablets (80 mg total) by mouth daily. 05/12/23   Raulkar, Drema Pry, MD  polyethylene glycol powder (GLYCOLAX/MIRALAX) 17 GM/SCOOP powder Take 17 g by mouth daily. 05/12/23   Raulkar, Drema Pry, MD  pramipexole (MIRAPEX) 0.5 MG tablet Take 1 tablet  (0.5 mg total) by mouth in the morning and at bedtime. 05/12/23   Raulkar, Drema Pry, MD  senna (SENOKOT) 8.6 MG TABS tablet Take 2 tablets (17.2 mg total) by mouth at bedtime. 05/12/23   Raulkar, Drema Pry, MD  senna-docusate (SENOKOT-S) 8.6-50 MG tablet Take 2 tablets by mouth daily at 6 (six) AM. 05/12/23   Raulkar, Drema Pry, MD  tamsulosin (FLOMAX) 0.4 MG CAPS capsule Take 1 capsule (0.4 mg total) by mouth at bedtime. 04/09/23   Love, Evlyn Kanner, PA-C  tiZANidine (ZANAFLEX) 2 MG tablet Take 1 tablet (2 mg total) by mouth at bedtime. 05/12/23   Raulkar, Drema Pry, MD  topiramate (TOPAMAX) 25 MG tablet Take 1 tablet (25 mg total) by mouth 2 (two) times daily. 05/12/23   Raulkar, Drema Pry, MD      Allergies    Sulfa antibiotics    Review of Systems   Review of Systems  Genitourinary:  Positive for hematuria.  All other systems reviewed and are negative.   Physical Exam Updated Vital Signs BP (!) 171/96 (BP Location: Left Arm)   Pulse 80   Temp 98.7 F (37.1 C) (Oral)   Resp 20   Ht 5\' 11"  (1.803 m)   Wt (!) 160.6 kg   SpO2 94%   BMI 49.37 kg/m  Physical Exam Vitals and nursing note reviewed.  Constitutional:      Appearance: He is well-developed.  HENT:     Head: Normocephalic and atraumatic.  Cardiovascular:     Rate and Rhythm: Normal rate and regular rhythm.     Heart sounds: No murmur heard. Pulmonary:     Effort: Pulmonary effort is normal. No respiratory distress.     Breath sounds: Normal breath sounds.  Abdominal:     Palpations: Abdomen is soft.     Tenderness: There is no abdominal tenderness. There is no guarding or rebound.  Musculoskeletal:        General: No tenderness.     Comments: 2-3+ pitting edema to BLE  Skin:    General: Skin is warm and dry.  Neurological:     Mental Status: He is alert and oriented to person, place, and time.  Psychiatric:        Behavior: Behavior normal.     ED Results / Procedures / Treatments   Labs (all labs  ordered are listed, but only abnormal results are displayed) Labs Reviewed  URINALYSIS, ROUTINE W REFLEX MICROSCOPIC - Abnormal; Notable for the following components:      Result Value   Hgb urine dipstick LARGE (*)    Protein, ur 30 (*)    Leukocytes,Ua LARGE (*)    All other components within normal limits  CBC WITH DIFFERENTIAL/PLATELET -  Abnormal; Notable for the following components:   WBC 11.7 (*)    RBC 3.78 (*)    Hemoglobin 11.1 (*)    HCT 34.2 (*)    Neutro Abs 7.8 (*)    Eosinophils Absolute 0.6 (*)    Abs Immature Granulocytes 0.13 (*)    All other components within normal limits  BASIC METABOLIC PANEL - Abnormal; Notable for the following components:   Glucose, Bld 136 (*)    BUN 27 (*)    Creatinine, Ser 2.00 (*)    GFR, Estimated 38 (*)    All other components within normal limits  URINALYSIS, MICROSCOPIC (REFLEX) - Abnormal; Notable for the following components:   Bacteria, UA FEW (*)    All other components within normal limits    EKG None  Radiology No results found.  Procedures Procedures  {Document cardiac monitor, telemetry assessment procedure when appropriate:1}  Medications Ordered in ED Medications - No data to display  ED Course/ Medical Decision Making/ A&P   {   Click here for ABCD2, HEART and other calculatorsREFRESH Note before signing :1}                              Medical Decision Making Amount and/or Complexity of Data Reviewed Labs: ordered.   ***  {Document critical care time when appropriate:1} {Document review of labs and clinical decision tools ie heart score, Chads2Vasc2 etc:1}  {Document your independent review of radiology images, and any outside records:1} {Document your discussion with family members, caretakers, and with consultants:1} {Document social determinants of health affecting pt's care:1} {Document your decision making why or why not admission, treatments were needed:1} Final Clinical Impression(s) / ED  Diagnoses Final diagnoses:  None    Rx / DC Orders ED Discharge Orders     None

## 2023-05-20 NOTE — ED Triage Notes (Signed)
Pt arrives with c/o hematuria that started this evening. Pt did just started taking eliquis for DVTs. Pt denies SOB or CP. Pt endorses burning sensation towards the end of urinating and urinary frequency.

## 2023-05-20 NOTE — Therapy (Signed)
OUTPATIENT PHYSICAL THERAPY CERVICAL/THORACIC TREATMENT   Patient Name: Raymond [name] MRN: [id number] DOB:20-Jun-1965, 57 y.o., male Today's Date: 05/20/2023  END OF SESSION:  PT End of Session - 05/20/23 1516     Visit Number 7    Date for PT Re-Evaluation 06/16/23    PT Start Time 1145    PT Stop Time 1225    PT Time Calculation (min) 40 min    Activity Tolerance Patient tolerated treatment well    Behavior During Therapy WFL for tasks assessed/performed              Past Medical History:  Diagnosis Date   Anemia    Anxiety    Back pain    COPD (chronic obstructive pulmonary disease) (HCC)    Depression    Diabetes mellitus without complication (HCC)    Diverticulosis    with hx of LGIB   Edema    GERD (gastroesophageal reflux disease)    GIB (gastrointestinal bleeding)    Gout    Heavy smoker    Hyperlipidemia    Hypertension    Iron deficiency anemia    Sleep apnea    uses a cpap   Swelling of lower extremity    Past Surgical History:  Procedure Laterality Date   CHONDROPLASTY Left 06/28/2014   Procedure: CHONDROPLASTY;  Surgeon: Thera Flake., MD;  Location: [facility];  Service: Orthopedics;  Laterality: Left;   COLONOSCOPY     FOOT FASCIOTOMY     age 45-rt   KNEE ARTHROSCOPY WITH LATERAL MENISECTOMY Left 06/28/2014   Procedure: KNEE ARTHROSCOPY WITH LATERAL MENISECTOMY;  Surgeon: Thera Flake., MD;  Location: [facility];  Service: Orthopedics;  Laterality: Left;   KNEE ARTHROSCOPY WITH MEDIAL MENISECTOMY Left 06/28/2014   Procedure: LEFT KNEE ARTHROSCOPY CHONDROPLASTY/WITH MEDIAL/LATERAL MENISECTOMIES;  Surgeon: Thera Flake., MD;  Location:  SURGERY CENTER;  Service: Orthopedics;  Laterality: Left;   ORIF RADIUS & ULNA FRACTURES  2007   left   THORACIC DISCECTOMY N/A 03/12/2023   Procedure: THORACIC LAMINECTOMY AND  DISCECTOMY;  Surgeon: Coletta Memos, MD;  Location: [facility] OR;  Service: Neurosurgery;   Laterality: N/A;   Patient Active Problem List   Diagnosis Date Noted   Vitamin D deficiency 05/14/2023   Low HDL (under 40) 05/14/2023   Acute on chronic anemia 04/13/2023   Acute kidney injury superimposed on chronic kidney disease (HCC) 04/13/2023   GERD (gastroesophageal reflux disease) 04/13/2023   Constipation 04/13/2023   Chronic back pain 04/13/2023   DVT, bilateral lower limbs (HCC) 04/13/2023   Coping style affecting medical condition 03/27/2023   Thoracic myelopathy 03/23/2023   HNP (herniated nucleus pulposus with myelopathy), thoracic 03/12/2023   Hyperlipidemia 12/24/2022   Failure to thrive in adult 12/24/2022   Tobacco abuse 03/18/2018   BPH associated with nocturia 03/11/2017   History of substance abuse (HCC) 03/11/2017   Restless leg syndrome 03/11/2017   Restrictive lung disease 05/14/2016   OSA on CPAP 05/14/2016   Moderate COPD (chronic obstructive pulmonary disease) (HCC) 05/14/2016   Smoking greater than 40 pack years 05/14/2016   SOB (shortness of breath) 10/06/2014   Edema of extremities 10/06/2014   Benign essential HTN 10/06/2014   Type 2 diabetes mellitus, without long-term current use of insulin (HCC) 07/18/2011    PCP: Venetia Constable, MD  REFERRING PROVIDER:  Charlton Amor, PA-C   REFERRING DIAG: [phone] (ICD-10-CM) - Other spondylosis with myelopathy, thoracic region  THERAPY DIAG:  Muscle weakness (generalized)  Stiffness of right knee, not elsewhere classified  Other symptoms and signs involving the musculoskeletal system  Chronic pain of left knee  Stiffness of left knee, not elsewhere classified  Chronic pain of right knee  Rationale for Evaluation and Treatment: Rehabilitation  ONSET DATE: October 2024  SUBJECTIVE:                                                                                                                                                                                                          SUBJECTIVE STATEMENT: Pt reports L shoulder pain. Hurts in the joint itself. He has difficulty reaching up to 90 degrees due to pain.  From eval: Pt states he fell, broke his ankle and was found to later have a ruptured disc and had to get back surgery. Went to inpatient rehab x 3 weeks. Felt like he built up his upper body. Has now been home x 1 week. States he has not been doing much since then -- has done some sitting exercises. Was able to stand for 3 min bouts for a total of about 12 min using RW at inpatient rehab. Has history of bad knees and will be getting gel shots. 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 He was found to have large HNP T10/T11 and underwent T10 laminectomy with discectomy by Dr. Franky Macho on 03/12/2023   PAIN:  Are you having pain? No  PRECAUTIONS: None  RED FLAGS: None     WEIGHT BEARING RESTRICTIONS: No  FALLS:  Has patient fallen in last 6 months? Yes. Number of falls 1 -- last fall Oct 2024  LIVING ENVIRONMENT: Lives with: lives with their spouse Lives in: House/apartment Stairs:  ramp Has following equipment at home: Environmental consultant - 2 wheeled, Wheelchair (manual), and upright walker, hospital bed  OCCUPATION: Retired  PLOF:  Able to do his own ADLs but easier to have wife assist him  PATIENT GOALS: Walk with the walker at least household distances  NEXT MD VISIT: 04/21/23 [place]  OBJECTIVE:  Note: Objective measures were completed at Evaluation unless otherwise noted.  DIAGNOSTIC FINDINGS:  03/12/23 MRI THORACIC SPINE IMPRESSION:   1. Moderate-sized lobulated left subarticular to foraminal disc protrusion at T10-11 with resultant severe spinal stenosis. Cord  is compressed and  deviated to the right at this level. Associated cord signal changes concerning for edema and/or myelomalacia. 2. No other acute abnormality within the thoracic spine. 3. Degenerative spondylosis at T8-9 through T11-12 without significant spinal stenosis. Associated moderate to severe bilateral foraminal narrowing at these levels as above.  MRI LUMBAR SPINE IMPRESSION:   1. No acute abnormality within the lumbar spine. 2. Multifactorial degenerative changes at L4-5 with resultant mild canal with severe left and moderate right lateral recess stenosis, with severe bilateral L4 foraminal narrowing. 3. Additional mild noncompressive disc bulging and facet hypertrophy elsewhere within the lumbar spine as above. No other significant stenosis or frank neural impingement.   Critical Value/emergent results were called by telephone at the time of interpretation on 03/12/2023 at 12:41 am to provider Dr. Madilyn Hook, who verbally acknowledged these results.  PATIENT SURVEYS:  FOTO 42; predicted 86  COGNITION: Overall cognitive status: Within functional limits for tasks assessed  SENSATION: WFL  POSTURE: rounded shoulders and forward head  PALPATION: No overt tenderness to palpation   CERVICAL ROM: WNL  LOWER EXTREMITY MMT:    MMT Right eval Left eval  Hip flexion 3+ 3  Hip extension 3+ 3+  Hip abduction 3+ 3+  Hip adduction    Hip internal rotation    Hip external rotation    Knee flexion 4- 3+  Knee extension 4- 3+  Ankle dorsiflexion    Ankle plantarflexion    Ankle inversion    Ankle eversion     (Blank rows = not tested)   FUNCTIONAL TESTS:  Transfers:  Chair to bed: Multiple seated scoots with UEs  Sit<>stand: max A -- limited due to knee pain Supine to sit<>sit: min A for trunk and LE negotiation. Not performing log rolling  TODAY'S TREATMENT:                                                                                                                               DATE:  05/20/23 Assessed L shoulder due to increased pain. Negative full can, belly press, resisted ER, positive empty can, Hawkin's. TTP mildly over L supraspinatus muscle and tendon, biceps tendon. Sliding board transfer to mat with S, VC to push with legs to assist. Seated L shoulder distraction holding the edge of mat and leaning to R. Seated press ups for scapular stability. Moved sit to supine with S. His feet and ankles appear more swollen today. He attemtped to push into physioball to activate LE. However, his legs kept spasming, so this was deferred. Attempted sit to stand from elevated mat, but he was unable to raise hips today.  05/14/23 Transfer to mat, Challenging for level transfer, but I Sit to Supine Mi, improved ability to lift legs to surface Supine with legs over G physioball- bridge, DKTC, quick side to side rotations, alternating lifting one leg off ball to mat and back up, 10 reps each. Supine to long sit, then scoot hips forward to the  edge of mat, very challenging, but able to accomplish with VC from therapist for correct technique, lean and scoot. Fitter press, 1 x 10 with 1 blue band, then 1 x 5 with 2 blue bands, focus Standing in parallel bars from elevated W/C seat. Pushes up from bars with min A and extra time, still largely reliant on UE support, but trying to move and relax arms to rely on his legs more. Stood 3 x 30 sec, CGA once up with close guarding at knees in case of buckling.  05/11/23 W/C > mat scoot transfer with S. Difficulty getting started, but completed without physical A. Moved into longsit on mat, then scooted over and backwards. Performed multiple movements on the mat in order to facilitate active trunk stability, including prone lying, attempted to move to quadruped, but his knees kept slipping back as he tried to push up. Rolled to R and performed L hip abd, repeated on L sidelie for R hip. Sup to sit on edge of mat with much  less effort required today, S Sit<> partial stand from elevated mat with hands on chair in front of him, able to come to stand with hands on seat, then place hands on the armrests of the chair. 3 reps. Sit to stand with mod A to initiate and given extra time to rise. Parallel bars- Sit to stand from W/C with Airex pad in seat to elevate slightly. Pushed up on parallel bars. Able to achieve fully upright stance and perform lat weight shifts, but had difficulty once he tried to lift either foot to simulate taking a step, poor control of limb in space.  05/07/23 Sliding board transfer to mat using lean technique with shoulders away from hips to initiate. He then was able to lift his hips and scoot the rest of the way. Supine bridging x 10, able to clear hips Repeated roll to each side x 3 using trunk into flexion. Fitter press with 2 blue bands x 10 each leg, very challenging. Functional trunk and LE strengthening, seated on mat, turn to R, walking both hands away from trunk, then lifting L LE to touch heel to surface of mat. X 5 lifts to each side, very challenging. Sit<> partial stand from elevated mat with hands on chair in front of him, able to come to stand with hands on seat, then place hands on the armrests of the chair. Moved to parallel bars with Air ex pad on top of his W/C cushion to elevate him. Sit > Stand pulling bars, with mod A and time to push into full upright posture where he stood x approx 30 seconds. Relies on arms to A.  05/06/23 Lateral transfers from W/C <> mat with min A. Sit to supine with light min A to BLE Rolled to L- Therapist facilitated lower trunk active mobilization in ant/post and inf/sup planes of movement. Moved to R LE with acitve hip abd, very weak, tends to turn and use quads, clamshell also required mod A. Facilitated repeated rolling using normalized technique with RLE and UE held in the air to engage trunk. Very challenging, but able to complete x 4. Repeated  all activities in R side lying. Moved from Sup > long sit I, but did report some strain in back Sit <> partial stand from elevated mat, hands on the seat or armrests of a chair facing him, required +2 to initiate due to B knee pain, but he was able to rise into bent over standing position x5  and hold for up to 5 seconds. B scoot transfers, emphasizing lifting hips off the surface by pushing through legs, x 5 each way on mat Transfer back to W/C with CGA. Mat > W/C with CGA  04/23/23 Seated heel slide 2x10 Transfer x2; min A to stabilize w/c while getting to bed. Pt utilizing multiple scoots and UEs SLR short range x10 (>10 deg extensor lag bilat) Quad set 2x10 SAQ 2# ankle weight 2x10 Supine clamshell green TB 2x10 Bridge 2x10 Supine to sit log roll with SBA for v/cs In // bars pulling up for 1/4 to standing 2x5  In w/c pushing with legs only 2x10' In w/c pulling with legs only 2x10'  04/21/23 See HEP below   PATIENT EDUCATION:  Education details: Exam findings, POC, initial HEP Person educated: Patient Education method: Explanation, Demonstration, and Handouts Education comprehension: verbalized understanding, returned demonstration, and needs further education  HOME EXERCISE PROGRAM: Access Code: [email or web address] URL: [email or web address] Date: 04/21/2023 Prepared by: Vernon Prey April Kirstie Peri  Exercises - Staggered Bridge  - 1 x daily - 7 x weekly - 2 sets - 10 reps - Hooklying Isometric Clamshell  - 1 x daily - 7 x weekly - 2 sets - 10 reps - Small Range Straight Leg Raise  - 1 x daily - 7 x weekly - 2 sets - 10 reps  ASSESSMENT:  CLINICAL IMPRESSION: Patient had a rough day today due to new onset of L shoulder pain. Assessment shows some possible impingement. His legs are also more swollen today and he was down. All RC screening was neg at this time, but limited. Had him practice some Humeral distraction and posterior chain activation. Will re-assess shoulder on  next visit.  From eval: Patient is a 57 y.o. M who was seen today for physical therapy evaluation and treatment for thoracic myelopathy. PMH significant for bilat knee OA (pt to get gel shots and is interested in future TKA once he gets his weight down) and T10 laminectomy with discectomy by Dr. Franky Macho on 03/12/2023. Went to inpatient rehab x 3 weeks. Has now been at home x 1 week. Pt states he does not currently have any known precautions. Assessment significant for gross bilat LE weakness (L LE weaker than R) affecting standing tolerance, transfers and balance. Pt is limited due to his knee pain. Pt will highly benefit from PT to address these deficits to reach pt's goals for increased mobility and amb.   OBJECTIVE IMPAIRMENTS: Abnormal gait, decreased balance, decreased endurance, decreased mobility, difficulty walking, decreased ROM, decreased strength, increased edema, improper body mechanics, postural dysfunction, obesity, and pain.   GOALS: Goals reviewed with patient? Yes  SHORT TERM GOALS: Target date: 06/02/2023   Pt will be ind with initial HEP Baseline:  Goal status: 05/06/23- provided, but inconsistent in performing. Ongoing  2.  Pt will be able to tolerate standing x 5 min to brush his teeth Baseline:  Goal status: INITIAL  3.  Pt will be able to perform stand pivot t/f with RW min a Baseline:  Goal status: 05/06/23- scooting transfers with min a. Ongoing   LONG TERM GOALS: Target date: 07/14/2023   Pt will be ind with management and progression of HEP Baseline:  Goal status: INITIAL  2.  Pt will be able to amb at least 20' with RW min A for home mobility Baseline:  Goal status: INITIAL  3.  Pt will be able to perform 5x STS to demo increased functional LE strength Baseline: Unable to perform  complete STS without max A Goal status: INITIAL  4.  Pt will have increased FOTO to >/=54 Baseline:  Goal status: INITIAL  PLAN:  PT FREQUENCY: 2x/week  PT DURATION:  12 weeks  PLANNED INTERVENTIONS: 97164- PT Re-evaluation, 97110-Therapeutic exercises, 97530- Therapeutic activity, [id number]- Neuromuscular re-education, 97535- Self Care, 13244- Manual therapy, [id number]- Gait training, [phone]- Orthotic Fit/training, [phone]- Aquatic Therapy, 97014- Electrical stimulation (unattended), [id number]- Ultrasound, 44034- Ionotophoresis 4mg /ml Dexamethasone, Patient/Family education, Balance training, Stair training, Taping, Dry Needling, Joint mobilization, Spinal mobilization, Cryotherapy, and Moist heat  PLAN FOR NEXT SESSION: Assess response to HEP. Continue to work on LE strengthening and transfers/standing.    Iona Beard, DPT 05/20/2023, 3:17 PM

## 2023-05-21 ENCOUNTER — Emergency Department (HOSPITAL_BASED_OUTPATIENT_CLINIC_OR_DEPARTMENT_OTHER): Payer: Managed Care, Other (non HMO)

## 2023-05-21 ENCOUNTER — Encounter (HOSPITAL_COMMUNITY): Payer: Self-pay | Admitting: Internal Medicine

## 2023-05-21 DIAGNOSIS — G8929 Other chronic pain: Secondary | ICD-10-CM | POA: Diagnosis not present

## 2023-05-21 DIAGNOSIS — M545 Low back pain, unspecified: Secondary | ICD-10-CM | POA: Diagnosis not present

## 2023-05-21 DIAGNOSIS — R351 Nocturia: Secondary | ICD-10-CM

## 2023-05-21 DIAGNOSIS — K219 Gastro-esophageal reflux disease without esophagitis: Secondary | ICD-10-CM | POA: Diagnosis not present

## 2023-05-21 DIAGNOSIS — Z6841 Body Mass Index (BMI) 40.0 and over, adult: Secondary | ICD-10-CM | POA: Diagnosis not present

## 2023-05-21 DIAGNOSIS — G4733 Obstructive sleep apnea (adult) (pediatric): Secondary | ICD-10-CM | POA: Diagnosis not present

## 2023-05-21 DIAGNOSIS — K5792 Diverticulitis of intestine, part unspecified, without perforation or abscess without bleeding: Secondary | ICD-10-CM | POA: Diagnosis not present

## 2023-05-21 DIAGNOSIS — E1122 Type 2 diabetes mellitus with diabetic chronic kidney disease: Secondary | ICD-10-CM | POA: Diagnosis not present

## 2023-05-21 DIAGNOSIS — N401 Enlarged prostate with lower urinary tract symptoms: Secondary | ICD-10-CM | POA: Diagnosis not present

## 2023-05-21 DIAGNOSIS — S82409A Unspecified fracture of shaft of unspecified fibula, initial encounter for closed fracture: Secondary | ICD-10-CM | POA: Diagnosis not present

## 2023-05-21 DIAGNOSIS — Z993 Dependence on wheelchair: Secondary | ICD-10-CM | POA: Diagnosis not present

## 2023-05-21 DIAGNOSIS — J449 Chronic obstructive pulmonary disease, unspecified: Secondary | ICD-10-CM

## 2023-05-21 DIAGNOSIS — I10 Essential (primary) hypertension: Secondary | ICD-10-CM | POA: Diagnosis not present

## 2023-05-21 DIAGNOSIS — R112 Nausea with vomiting, unspecified: Secondary | ICD-10-CM | POA: Diagnosis not present

## 2023-05-21 DIAGNOSIS — I129 Hypertensive chronic kidney disease with stage 1 through stage 4 chronic kidney disease, or unspecified chronic kidney disease: Secondary | ICD-10-CM | POA: Diagnosis not present

## 2023-05-21 DIAGNOSIS — F17210 Nicotine dependence, cigarettes, uncomplicated: Secondary | ICD-10-CM | POA: Diagnosis not present

## 2023-05-21 DIAGNOSIS — K5732 Diverticulitis of large intestine without perforation or abscess without bleeding: Secondary | ICD-10-CM | POA: Diagnosis not present

## 2023-05-21 DIAGNOSIS — E7849 Other hyperlipidemia: Secondary | ICD-10-CM

## 2023-05-21 DIAGNOSIS — N179 Acute kidney failure, unspecified: Secondary | ICD-10-CM | POA: Diagnosis not present

## 2023-05-21 DIAGNOSIS — D509 Iron deficiency anemia, unspecified: Secondary | ICD-10-CM | POA: Diagnosis not present

## 2023-05-21 DIAGNOSIS — N390 Urinary tract infection, site not specified: Secondary | ICD-10-CM | POA: Diagnosis present

## 2023-05-21 DIAGNOSIS — I824Y3 Acute embolism and thrombosis of unspecified deep veins of proximal lower extremity, bilateral: Secondary | ICD-10-CM

## 2023-05-21 DIAGNOSIS — M4714 Other spondylosis with myelopathy, thoracic region: Secondary | ICD-10-CM | POA: Diagnosis not present

## 2023-05-21 DIAGNOSIS — Z8249 Family history of ischemic heart disease and other diseases of the circulatory system: Secondary | ICD-10-CM | POA: Diagnosis not present

## 2023-05-21 DIAGNOSIS — N3090 Cystitis, unspecified without hematuria: Secondary | ICD-10-CM | POA: Diagnosis present

## 2023-05-21 DIAGNOSIS — Z86718 Personal history of other venous thrombosis and embolism: Secondary | ICD-10-CM | POA: Diagnosis not present

## 2023-05-21 DIAGNOSIS — E119 Type 2 diabetes mellitus without complications: Secondary | ICD-10-CM

## 2023-05-21 DIAGNOSIS — G2581 Restless legs syndrome: Secondary | ICD-10-CM

## 2023-05-21 DIAGNOSIS — F419 Anxiety disorder, unspecified: Secondary | ICD-10-CM | POA: Diagnosis not present

## 2023-05-21 DIAGNOSIS — F32A Depression, unspecified: Secondary | ICD-10-CM | POA: Diagnosis not present

## 2023-05-21 DIAGNOSIS — E66813 Obesity, class 3: Secondary | ICD-10-CM | POA: Diagnosis not present

## 2023-05-21 DIAGNOSIS — N189 Chronic kidney disease, unspecified: Secondary | ICD-10-CM | POA: Diagnosis not present

## 2023-05-21 DIAGNOSIS — E785 Hyperlipidemia, unspecified: Secondary | ICD-10-CM | POA: Diagnosis not present

## 2023-05-21 DIAGNOSIS — Z882 Allergy status to sulfonamides status: Secondary | ICD-10-CM | POA: Diagnosis not present

## 2023-05-21 LAB — COMPREHENSIVE METABOLIC PANEL
ALT: 14 U/L (ref 0–44)
AST: 14 U/L — ABNORMAL LOW (ref 15–41)
Albumin: 2.7 g/dL — ABNORMAL LOW (ref 3.5–5.0)
Alkaline Phosphatase: 83 U/L (ref 38–126)
Anion gap: 6 (ref 5–15)
BUN: 19 mg/dL (ref 6–20)
CO2: 26 mmol/L (ref 22–32)
Calcium: 9.2 mg/dL (ref 8.9–10.3)
Chloride: 107 mmol/L (ref 98–111)
Creatinine, Ser: 1.75 mg/dL — ABNORMAL HIGH (ref 0.61–1.24)
GFR, Estimated: 44 mL/min — ABNORMAL LOW (ref >60)
Glucose, Bld: 120 mg/dL — ABNORMAL HIGH (ref 70–99)
Potassium: 3.3 mmol/L — ABNORMAL LOW (ref 3.5–5.1)
Sodium: 139 mmol/L (ref 135–145)
Total Bilirubin: 0.3 mg/dL (ref <1.2)
Total Protein: 6.7 g/dL (ref 6.5–8.1)

## 2023-05-21 LAB — GLUCOSE, CAPILLARY
Glucose-Capillary: 139 mg/dL — ABNORMAL HIGH (ref 70–99)
Glucose-Capillary: 120 mg/dL — ABNORMAL HIGH (ref 70–99)

## 2023-05-21 LAB — CBC
HCT: 31.4 % — ABNORMAL LOW (ref 39.0–52.0)
Hemoglobin: 10 g/dL — ABNORMAL LOW (ref 13.0–17.0)
MCH: 28.9 pg (ref 26.0–34.0)
MCHC: 31.8 g/dL (ref 30.0–36.0)
MCV: 90.8 fL (ref 80.0–100.0)
Platelets: 368 10*3/uL (ref 150–400)
RBC: 3.46 MIL/uL — ABNORMAL LOW (ref 4.22–5.81)
RDW: 15.4 % (ref 11.5–15.5)
WBC: 10.8 10*3/uL — ABNORMAL HIGH (ref 4.0–10.5)
nRBC: 0 % (ref 0.0–0.2)

## 2023-05-21 MED ORDER — DULOXETINE HCL 30 MG PO CPEP
30.0000 mg | ORAL_CAPSULE | Freq: Every day | ORAL | Status: DC
  Administered 2023-05-21 – 2023-05-25 (×5): 30 mg via ORAL
  Filled 2023-05-21 (×5): qty 1

## 2023-05-21 MED ORDER — DOXAZOSIN MESYLATE 2 MG PO TABS
2.0000 mg | ORAL_TABLET | Freq: Every day | ORAL | Status: DC
  Administered 2023-05-21 – 2023-05-24 (×4): 2 mg via ORAL
  Filled 2023-05-21 (×5): qty 1

## 2023-05-21 MED ORDER — MELATONIN 5 MG PO TABS
5.0000 mg | ORAL_TABLET | Freq: Every evening | ORAL | Status: DC | PRN
  Administered 2023-05-24: 5 mg via ORAL
  Filled 2023-05-21: qty 1

## 2023-05-21 MED ORDER — SODIUM CHLORIDE 0.9 % IV SOLN: INTRAVENOUS | Status: AC

## 2023-05-21 MED ORDER — APIXABAN 5 MG PO TABS
5.0000 mg | ORAL_TABLET | Freq: Two times a day (BID) | ORAL | Status: DC
  Administered 2023-05-21 – 2023-05-25 (×8): 5 mg via ORAL
  Filled 2023-05-21 (×8): qty 1

## 2023-05-21 MED ORDER — INSULIN ASPART 100 UNIT/ML IJ SOLN: 0.0000 [IU] | Freq: Three times a day (TID) | INTRAMUSCULAR | Status: DC

## 2023-05-21 MED ORDER — SORBITOL 70 % SOLN: 30.0000 mL | Freq: Every day | Status: DC | PRN

## 2023-05-21 MED ORDER — ALBUTEROL SULFATE (2.5 MG/3ML) 0.083% IN NEBU: 2.5000 mg | INHALATION_SOLUTION | Freq: Four times a day (QID) | RESPIRATORY_TRACT | Status: DC | PRN

## 2023-05-21 MED ORDER — ALBUTEROL SULFATE HFA 108 (90 BASE) MCG/ACT IN AERS: 2.0000 | INHALATION_SPRAY | Freq: Four times a day (QID) | RESPIRATORY_TRACT | Status: DC | PRN

## 2023-05-21 MED ORDER — DIPHENHYDRAMINE HCL 50 MG/ML IJ SOLN: 25.0000 mg | Freq: Once | INTRAMUSCULAR | Status: DC

## 2023-05-21 MED ORDER — POLYETHYLENE GLYCOL 3350 17 G PO PACK: 17.0000 g | PACK | Freq: Every day | ORAL | Status: DC | PRN

## 2023-05-21 MED ORDER — ALPRAZOLAM 0.5 MG PO TABS
0.5000 mg | ORAL_TABLET | Freq: Two times a day (BID) | ORAL | Status: DC | PRN
  Administered 2023-05-22 – 2023-05-24 (×2): 0.5 mg via ORAL
  Filled 2023-05-21 (×2): qty 1

## 2023-05-21 MED ORDER — METRONIDAZOLE 500 MG/100ML IV SOLN
500.0000 mg | Freq: Once | INTRAVENOUS | Status: AC
  Administered 2023-05-21: 500 mg via INTRAVENOUS
  Filled 2023-05-21: qty 100

## 2023-05-21 MED ORDER — PRAMIPEXOLE DIHYDROCHLORIDE 0.25 MG PO TABS
0.5000 mg | ORAL_TABLET | Freq: Two times a day (BID) | ORAL | Status: DC
  Administered 2023-05-21 – 2023-05-25 (×8): 0.5 mg via ORAL
  Filled 2023-05-21 (×9): qty 2

## 2023-05-21 MED ORDER — ACETAMINOPHEN 325 MG PO TABS: 650.0000 mg | ORAL_TABLET | Freq: Four times a day (QID) | ORAL | Status: DC | PRN

## 2023-05-21 MED ORDER — BACLOFEN 10 MG PO TABS
10.0000 mg | ORAL_TABLET | Freq: Three times a day (TID) | ORAL | Status: DC | PRN
  Administered 2023-05-21 – 2023-05-25 (×7): 10 mg via ORAL
  Filled 2023-05-21 (×7): qty 1

## 2023-05-21 MED ORDER — CARVEDILOL 25 MG PO TABS
25.0000 mg | ORAL_TABLET | Freq: Two times a day (BID) | ORAL | Status: DC
  Administered 2023-05-21 – 2023-05-25 (×8): 25 mg via ORAL
  Filled 2023-05-21 (×8): qty 1

## 2023-05-21 MED ORDER — INSULIN ASPART 100 UNIT/ML IJ SOLN
0.0000 [IU] | Freq: Three times a day (TID) | INTRAMUSCULAR | Status: DC
  Administered 2023-05-21 – 2023-05-24 (×2): 1 [IU] via SUBCUTANEOUS

## 2023-05-21 MED ORDER — TAMSULOSIN HCL 0.4 MG PO CAPS
0.4000 mg | ORAL_CAPSULE | Freq: Every day | ORAL | Status: DC
  Administered 2023-05-21 – 2023-05-24 (×4): 0.4 mg via ORAL
  Filled 2023-05-21 (×4): qty 1

## 2023-05-21 MED ORDER — PANTOPRAZOLE SODIUM 40 MG PO TBEC
80.0000 mg | DELAYED_RELEASE_TABLET | Freq: Every day | ORAL | Status: DC
  Administered 2023-05-21 – 2023-05-25 (×5): 80 mg via ORAL
  Filled 2023-05-21 (×5): qty 2

## 2023-05-21 MED ORDER — SODIUM CHLORIDE 0.9 % IV SOLN
2.0000 g | INTRAVENOUS | Status: DC
  Administered 2023-05-21 – 2023-05-23 (×3): 2 g via INTRAVENOUS
  Filled 2023-05-21 (×3): qty 20

## 2023-05-21 MED ORDER — LIDOCAINE 5 % EX PTCH
2.0000 | MEDICATED_PATCH | Freq: Every day | CUTANEOUS | Status: DC
Start: 2023-05-21 — End: 2023-05-25
  Administered 2023-05-21 – 2023-05-24 (×4): 2 via TRANSDERMAL
  Filled 2023-05-21 (×4): qty 2

## 2023-05-21 MED ORDER — ACETAMINOPHEN 650 MG RE SUPP: 650.0000 mg | Freq: Four times a day (QID) | RECTAL | Status: DC | PRN

## 2023-05-21 MED ORDER — HYDROMORPHONE HCL 1 MG/ML IJ SOLN
0.5000 mg | INTRAMUSCULAR | Status: DC | PRN
  Administered 2023-05-22 – 2023-05-25 (×12): 1 mg via INTRAVENOUS
  Filled 2023-05-21 (×12): qty 1

## 2023-05-21 MED ORDER — ACETAMINOPHEN 325 MG PO TABS
650.0000 mg | ORAL_TABLET | Freq: Four times a day (QID) | ORAL | Status: DC | PRN
  Administered 2023-05-21: 650 mg via ORAL
  Filled 2023-05-21: qty 2

## 2023-05-21 MED ORDER — FENTANYL CITRATE PF 50 MCG/ML IJ SOSY
50.0000 ug | PREFILLED_SYRINGE | Freq: Once | INTRAMUSCULAR | Status: AC
Start: 2023-05-21 — End: 2023-05-21
  Administered 2023-05-21: 50 ug via INTRAVENOUS
  Filled 2023-05-21: qty 1

## 2023-05-21 MED ORDER — METRONIDAZOLE 500 MG/100ML IV SOLN
500.0000 mg | Freq: Two times a day (BID) | INTRAVENOUS | Status: DC
  Administered 2023-05-21 – 2023-05-24 (×6): 500 mg via INTRAVENOUS
  Filled 2023-05-21 (×6): qty 100

## 2023-05-21 MED ORDER — SODIUM CHLORIDE 0.9% FLUSH
3.0000 mL | Freq: Two times a day (BID) | INTRAVENOUS | Status: DC
  Administered 2023-05-21 – 2023-05-25 (×9): 3 mL via INTRAVENOUS

## 2023-05-21 MED ORDER — ATORVASTATIN CALCIUM 40 MG PO TABS
40.0000 mg | ORAL_TABLET | Freq: Every evening | ORAL | Status: DC
  Administered 2023-05-21 – 2023-05-24 (×4): 40 mg via ORAL
  Filled 2023-05-21 (×4): qty 1

## 2023-05-21 MED ORDER — HYDROCODONE-ACETAMINOPHEN 5-325 MG PO TABS
1.0000 | ORAL_TABLET | ORAL | Status: DC | PRN
  Administered 2023-05-21 – 2023-05-23 (×5): 2 via ORAL
  Filled 2023-05-21 (×5): qty 2

## 2023-05-21 NOTE — ED Notes (Addendum)
Pt here to be evaluated for hematuria that started 1800 this evening.  Pt started on eliquis in November for bilateral blood clots in lower extremities.  Pt having some dysuria and hesitancy when voiding

## 2023-05-21 NOTE — Progress Notes (Signed)
NEW ADMISSION NOTE New Admission Note:   Arrival Method: Patient arrived from ED Mental Orientation: Alert and oriented x 4. Telemetry: N/A Assessment: Completed Skin: warm, dry and intact. IV: R FA infusing NS @ 30ml/hr Pain: 8/10, given pain med. Tubes: N/A Safety Measures: Safety Fall Prevention Plan has been given, discussed and implemented. Admission: Completed [facility] Orientation: Patient has been orientated to the room, unit and staff.  Family: Wife at bedside  Orders have been reviewed and implemented. Will continue to monitor the patient. Call light has been placed within reach and bed alarm has been activated.   Arvilla Meres, RN

## 2023-05-21 NOTE — ED Notes (Signed)
Pt voided 400 ml of clear yellow urine, no blood seen and no clots.  EDP made aware

## 2023-05-21 NOTE — ED Notes (Signed)
[facility] called for Transfer at 10:43am.

## 2023-05-21 NOTE — TOC CM/SW Note (Addendum)
Transition of Care [facility]) - Inpatient Brief Assessment   Patient Details  Name: Raymond [name] MRN: [id number] Date of Birth: [birth year]  Transition of Care [facility]) CM/SW Contact:    Tom-Johnson, Hershal Coria, RN Phone Number: 05/21/2023, 2:40 PM   Clinical Narrative:  Patient presented to the ED with Hematuria with difficulty passing urine and pain at end of stream, Abdominal pain associated with N/V and Diarrhea.  Admitted with Acute Diverticulitis/UTI/AKI. On IV abx. Has hx of COPD, DM, GERD, OSA, RLS, BPH, DVT started on Eliquis, Restrictive Lung Disease and Chronic Pain.    Patient is from home with wife, independent with care.  PCP is Lyn Hollingshead, Faythe Casa, MD and uses [facility] on [address]  No TOC needs or recommendations noted at this time.  Patient not Medically ready for discharge.  CM will continue to follow as patient progresses with care towards discharge.           Transition of Care Asessment: Insurance and Status: Insurance coverage has been reviewed Patient has primary care physician: Yes Home environment has been reviewed: Yes Prior level of function:: Independent Prior/Current Home Services: No current home services Social Drivers of Health Review: SDOH reviewed no interventions necessary Readmission risk has been reviewed: Yes Transition of care needs: no transition of care needs at this time

## 2023-05-21 NOTE — H&P (Addendum)
History and Physical   Raymond [name] ZOX:[id number] DOB: 02-05-1966 DOA: 05/20/2023  PCP: Venetia Constable, MD   Patient coming from: Home  Chief Complaint: Abdominal pain, hematuria  HPI: Raymond [name] is a 56 y.o. male with medical history significant of hypertension, hyperlipidemia, COPD, diabetes, GERD, OSA, RLS, BPH, DVT, restrictive lung disease, chronic pain presenting with abdominal pain and hematuria.  Patient reports several days of abdominal pain with associated nausea vomiting and diarrhea.  Today he noticed gross hematuria when using the bathroom.  He is on Eliquis.  Denies fevers, chills, chest pain, shortness of breath, constipation.   ED Course: Vital signs in the ED notable for blood pressure in the 120s to 170s systolic.  Lab workup included BMP with BUN 27, creatinine elevated 2.0 from baseline of 1.6, glucose 136. CBC with mild leukocytosis to 11.7 and hemoglobin stable 11.1.  Urinalysis with hemoglobin, protein, leukocytes, bacteria.  Urine culture pending.  CT of the abdomen pelvis showed acute uncomplicated diverticulitis as well as thick-walled nondilated bladder.  Patient received Tylenol, fentanyl, ceftriaxone, Flagyl in the ED.  Also received Zofran.  Review of Systems: As per HPI otherwise all other systems reviewed and are negative.  Past Medical History:  Diagnosis Date   Acute kidney injury superimposed on chronic kidney disease (HCC) 04/13/2023   Acute on chronic anemia 04/13/2023   Anemia    Anxiety    Back pain    COPD (chronic obstructive pulmonary disease) (HCC)    Depression    Diabetes mellitus without complication (HCC)    Diverticulosis    with hx of LGIB   Edema    GERD (gastroesophageal reflux disease)    GIB (gastrointestinal bleeding)    Gout    Heavy smoker    Hyperlipidemia    Hypertension    Iron deficiency anemia    Sleep apnea    uses a cpap   Swelling of lower extremity     Past Surgical History:  Procedure Laterality  Date   CHONDROPLASTY Left 06/28/2014   Procedure: CHONDROPLASTY;  Surgeon: Thera Flake., MD;  Location: [facility];  Service: Orthopedics;  Laterality: Left;   COLONOSCOPY     FOOT FASCIOTOMY     age 71-rt   KNEE ARTHROSCOPY WITH LATERAL MENISECTOMY Left 06/28/2014   Procedure: KNEE ARTHROSCOPY WITH LATERAL MENISECTOMY;  Surgeon: Thera Flake., MD;  Location: [facility];  Service: Orthopedics;  Laterality: Left;   KNEE ARTHROSCOPY WITH MEDIAL MENISECTOMY Left 06/28/2014   Procedure: LEFT KNEE ARTHROSCOPY CHONDROPLASTY/WITH MEDIAL/LATERAL MENISECTOMIES;  Surgeon: Thera Flake., MD;  Location: [facility];  Service: Orthopedics;  Laterality: Left;   ORIF RADIUS & ULNA FRACTURES  2007   left   THORACIC DISCECTOMY N/A 03/12/2023   Procedure: THORACIC LAMINECTOMY AND  DISCECTOMY;  Surgeon: Coletta Memos, MD;  Location: [facility] OR;  Service: Neurosurgery;  Laterality: N/A;    Social History  reports that he has been smoking cigarettes. He has never used smokeless tobacco. He reports current alcohol use. He reports that he does not use drugs.  Allergies  Allergen Reactions   Sulfa Antibiotics Hives    Family History  Problem Relation Age of Onset   Diabetes Mother    Hypertension Mother    High Cholesterol Mother    Heart disease Mother    Depression Mother    Diabetes Father    Hypertension Father    High Cholesterol Father  Heart disease Father    Cancer Other   Reviewed on admission  Prior to Admission medications   Medication Sig Start Date End Date Taking? Authorizing Provider  acetaminophen (TYLENOL) 325 MG tablet Take 1-2 tablets (325-650 mg total) by mouth every 4 (four) hours as needed for mild pain (pain score 1-3). 05/12/23  Yes Raulkar, Drema Pry, MD  albuterol (VENTOLIN HFA) 108 (90 Base) MCG/ACT inhaler Inhale 2 puffs into the lungs every 6 (six) hours as needed for wheezing or shortness of breath. 05/12/23  Yes Raulkar,  Drema Pry, MD  allopurinol (ZYLOPRIM) 300 MG tablet Take 1 tablet (300 mg total) by mouth daily. 05/12/23  Yes Raulkar, Drema Pry, MD  ALPRAZolam Prudy Feeler) 0.5 MG tablet Take 1 tablet (0.5 mg total) by mouth 2 (two) times daily as needed. 05/12/23  Yes Raulkar, Drema Pry, MD  apixaban (ELIQUIS) 5 MG TABS tablet Take 1 tablet (5 mg total) by mouth 2 (two) times daily. 05/12/23  Yes Raulkar, Drema Pry, MD  atorvastatin (LIPITOR) 40 MG tablet Take 1 tablet (40 mg total) by mouth every evening. 05/12/23  Yes Raulkar, Drema Pry, MD  baclofen (LIORESAL) 10 MG tablet Take 1 tablet (10 mg total) by mouth 3 (three) times daily. 05/12/23  Yes Raulkar, Drema Pry, MD  camphor-menthol [facility] [place]) lotion Apply topically as needed for itching. 05/12/23  Yes Raulkar, Drema Pry, MD  carvedilol (COREG) 25 MG tablet Take 1 tablet (25 mg total) by mouth in the morning and at bedtime. 05/12/23  Yes Raulkar, Drema Pry, MD  diclofenac Sodium (VOLTAREN) 1 % GEL Apply 4 g topically 4 (four) times daily. To bilateral knees 05/12/23  Yes Raulkar, Drema Pry, MD  docusate sodium (COLACE) 100 MG capsule Take 1 capsule (100 mg total) by mouth 2 (two) times daily. 05/12/23  Yes Raulkar, Drema Pry, MD  doxazosin (CARDURA) 2 MG tablet Take 1 tablet (2 mg total) by mouth at bedtime. 05/12/23  Yes Raulkar, Drema Pry, MD  DULoxetine (CYMBALTA) 30 MG capsule Take 1 capsule (30 mg total) by mouth daily. 05/12/23  Yes Raulkar, Drema Pry, MD  furosemide (LASIX) 20 MG tablet Take 1 tablet (20 mg total) by mouth 2 (two) times daily. Patient taking differently: Take 80 mg by mouth 2 (two) times daily. 04/10/23  Yes Love, Evlyn Kanner, PA-C  lidocaine (LIDODERM) 5 % Place 2 patches onto the skin daily at 10 pm. Remove & Discard patch within 12 hours or as directed by MD 05/12/23  Yes Raulkar, Drema Pry, MD  losartan (COZAAR) 50 MG tablet Take 1 tablet (50 mg total) by mouth daily. 05/12/23  Yes Raulkar, Drema Pry, MD  melatonin 5 MG TABS Take 1 tablet (5  mg total) by mouth at bedtime as needed. 05/12/23  Yes Raulkar, Drema Pry, MD  MOUNJARO 10 MG/0.5ML Pen Inject 10 mg into the skin once a week.   Yes [provider]  ondansetron (ZOFRAN) 4 MG tablet Take 1 tablet (4 mg total) by mouth every 8 (eight) hours as needed for nausea or vomiting. 05/13/23  Yes Corinna Capra A, DO  oxyCODONE (OXY IR/ROXICODONE) 5 MG immediate release tablet Take 1-2 tablets (5-10 mg total) by mouth every 4 (four) hours as needed for severe pain (pain score 7-10). 04/09/23  Yes Love, Evlyn Kanner, PA-C  pantoprazole (PROTONIX) 40 MG tablet Take 2 tablets (80 mg total) by mouth daily. 05/12/23  Yes Raulkar, Drema Pry, MD  polyethylene glycol powder (GLYCOLAX/MIRALAX) 17 GM/SCOOP powder Take 17 g by mouth  daily. Patient taking differently: Take 17 g by mouth daily as needed for moderate constipation. 05/12/23  Yes Raulkar, Drema Pry, MD  pramipexole (MIRAPEX) 0.5 MG tablet Take 1 tablet (0.5 mg total) by mouth in the morning and at bedtime. 05/12/23  Yes Raulkar, Drema Pry, MD  senna-docusate (SENOKOT-S) 8.6-50 MG tablet Take 2 tablets by mouth daily at 6 (six) AM. Patient taking differently: Take 2 tablets by mouth 2 (two) times daily. 05/12/23  Yes Raulkar, Drema Pry, MD  tamsulosin (FLOMAX) 0.4 MG CAPS capsule Take 1 capsule (0.4 mg total) by mouth at bedtime. 04/09/23  Yes Love, Evlyn Kanner, PA-C  tiZANidine (ZANAFLEX) 2 MG tablet Take 1 tablet (2 mg total) by mouth at bedtime. Patient taking differently: Take 2 mg by mouth at bedtime as needed for muscle spasms. 05/12/23  Yes Raulkar, Drema Pry, MD  senna (SENOKOT) 8.6 MG TABS tablet Take 2 tablets (17.2 mg total) by mouth at bedtime. Patient not taking: Reported on 05/21/2023 05/12/23   Horton Chin, MD  topiramate (TOPAMAX) 25 MG tablet Take 1 tablet (25 mg total) by mouth 2 (two) times daily. Patient not taking: Reported on 05/21/2023 05/12/23   Horton Chin, MD    Physical Exam: Vitals:   05/21/23 0800  05/21/23 1000 05/21/23 1100 05/21/23 1223  BP: 136/67 (!) 157/77 (!) 178/92 (!) 177/90  Pulse: 89  88 88  Resp: 20  20 (!) 24  Temp: 98.2 F (36.8 C)   98.2 F (36.8 C)  TempSrc: Oral   Oral  SpO2: 96%  96% 96%  Weight:      Height:        Physical Exam Constitutional:      General: He is not in acute distress.    Appearance: Normal appearance. He is obese.  HENT:     Head: Normocephalic and atraumatic.     Mouth/Throat:     Mouth: Mucous membranes are moist.     Pharynx: Oropharynx is clear.  Eyes:     Extraocular Movements: Extraocular movements intact.     Pupils: Pupils are equal, round, and reactive to light.  Cardiovascular:     Rate and Rhythm: Normal rate and regular rhythm.     Pulses: Normal pulses.     Heart sounds: Normal heart sounds.  Pulmonary:     Effort: Pulmonary effort is normal. No respiratory distress.     Breath sounds: Normal breath sounds.  Abdominal:     General: Bowel sounds are normal. There is no distension.     Palpations: Abdomen is soft.     Tenderness: There is abdominal tenderness.  Musculoskeletal:        General: No swelling or deformity.  Skin:    General: Skin is warm and dry.  Neurological:     General: No focal deficit present.     Mental Status: Mental status is at baseline.     Labs on Admission: I have personally reviewed following labs and imaging studies  CBC: Recent Labs  Lab 05/20/23 2117  WBC 11.7*  NEUTROABS 7.8*  HGB 11.1*  HCT 34.2*  MCV 90.5  PLT 375    Basic Metabolic Panel: Recent Labs  Lab 05/20/23 2117  NA 138  K 3.9  CL 105  CO2 23  GLUCOSE 136*  BUN 27*  CREATININE 2.00*  CALCIUM 9.2    GFR: Estimated Creatinine Clearance: 61.5 mL/min (A) (by C-G formula based on SCr of 2 mg/dL (H)).  Liver Function Tests: No  results for input(s): "AST", "ALT", "ALKPHOS", "BILITOT", "PROT", "ALBUMIN" in the last 168 hours.  Urine analysis:    Component Value Date/Time   COLORURINE YELLOW  05/20/2023 2117   APPEARANCEUR CLEAR 05/20/2023 2117   LABSPEC 1.015 05/20/2023 2117   PHURINE 6.5 05/20/2023 2117   GLUCOSEU NEGATIVE 05/20/2023 2117   HGBUR LARGE (A) 05/20/2023 2117   BILIRUBINUR NEGATIVE 05/20/2023 2117   KETONESUR NEGATIVE 05/20/2023 2117   PROTEINUR 30 (A) 05/20/2023 2117   NITRITE NEGATIVE 05/20/2023 2117   LEUKOCYTESUR LARGE (A) 05/20/2023 2117    Radiological Exams on Admission: CT ABDOMEN PELVIS WO CONTRAST Result Date: 05/21/2023 CLINICAL DATA:  Acute nonlocalized abdominal pain. Acute renal failure and hematuria EXAM: CT ABDOMEN AND PELVIS WITHOUT CONTRAST TECHNIQUE: Multidetector CT imaging of the abdomen and pelvis was performed following the standard protocol without IV contrast. RADIATION DOSE REDUCTION: This exam was performed according to the departmental dose-optimization program which includes automated exposure control, adjustment of the mA and/or kV according to patient size and/or use of iterative reconstruction technique. COMPARISON:  03/05/2023 FINDINGS: Lower chest: No acute abnormality. Hepatobiliary: Unremarkable liver. Normal gallbladder. No biliary dilation. Pancreas: Unremarkable. Spleen: Unremarkable. Adrenals/Urinary Tract: Normal adrenal glands. No urinary calculi or hydronephrosis. Bladder is nondistended and thick walled. Stomach/Bowel: Normal caliber large and small bowel. Diverticulosis about the descending and sigmoid colon. Wall thickening and adjacent stranding about the proximal sigmoid colon. No abscess. The appendix is normal.Stomach is within normal limits. Vascular/Lymphatic: No significant vascular findings are present. No enlarged abdominal or pelvic lymph nodes. Reproductive: Unremarkable. Other: No free intraperitoneal fluid or air. Musculoskeletal: No acute fracture. IMPRESSION: 1. Acute uncomplicated sigmoid diverticulitis. 2. Bladder is nondistended and thick walled. Correlate with urinalysis to exclude cystitis. Electronically  Signed   By: Minerva Fester M.D.   On: 05/21/2023 00:48    EKG: Not performed in emergency department  Assessment/Plan Principal Problem:   Acute diverticulitis Active Problems:   Benign essential HTN   BPH associated with nocturia   Hyperlipidemia   Restless leg syndrome   OSA on CPAP   Moderate COPD (chronic obstructive pulmonary disease) (HCC)   Type 2 diabetes mellitus, without long-term current use of insulin (HCC)   GERD (gastroesophageal reflux disease)   Chronic back pain   DVT, bilateral lower limbs (HCC)   Cystitis    Acute diverticulitis > Presenting with abdominal pain, nausea vomiting for last several days.  CT with acute uncomplicated diverticulitis.  Leukocytosis to 11.7.  Possible concurrent cystitis as below. > Received ceftriaxone, Flagyl, pain medication in the ED. - Monitor on MedSurg unit with continuous pulse ox given pain medication - Continue with ceftriaxone and Flagyl - IV fluids - Clear liquid diet, advance as tolerated - Tylenol for mild pain, Norco for moderate to severe pain, Dilaudid for breakthrough severe pain - Trend fever curve and WBC - Supportive care  ?Acute cystitis Hematuria > Reporting episode of hematuria.  Urinalysis showed hemoglobin, protein, leukocytes, bacteria.  Urine culture pending.  Does have history of ESBL. - On ceftriaxone and Flagyl as above - Follow-up urine culture results - Trend fever curve and WBC  AKI > Creatinine elevated 2.0 from baseline 1.6.  Has had ongoing nausea vomiting diarrhea.  Suspect dehydration. - Encourage oral intake - Gentle IV fluids - Trend renal function and electrolytes  Thoracic spondylosis with myelopathy > History of fracture and surgical intervention with thoracic laminectomy and discectomy earlier this year. > Follows with rehab.  Per recent notes has been able to  stand with rolling walker for minutes but not for prolonged periods. - PT/OT eval and treat   Hypertension -  Continue home carvedilol, doxazosin - Holding Lasix and losartan  Hyperlipidemia - Continue home statin  COPD - Continue home albuterol  Diabetes - SSI  GERD - Continue PPI  OSA - Continue home CPAP  RLS - Continue home pramipexole  BPH - Continue home tamsulosin  History of DVT - Continue home Eliquis  Anxiety - Continue home duloxetine and Xanax  DVT prophylaxis: Eliquis Code Status:   Full Family Communication:  None on admission  Disposition Plan:   Patient is from:  Home  Anticipated DC to:  Home  Anticipated DC date:  1 to 2 days  Anticipated DC barriers: None  Consults called:  None Admission status:  Observation, MedSurg  Severity of Illness: The appropriate patient status for this patient is OBSERVATION. Observation status is judged to be reasonable and necessary in order to provide the required intensity of service to ensure the patient's safety. The patient's presenting symptoms, physical exam findings, and initial radiographic and laboratory data in the context of their medical condition is felt to place them at decreased risk for further clinical deterioration. Furthermore, it is anticipated that the patient will be medically stable for discharge from the hospital within 2 midnights of admission.    Synetta Fail MD [facility]  How to contact the [facility] Attending or Consulting provider 7A - 7P or covering provider during after hours 7P -7A, for this patient?   Check the care team in [facility] and look for a) attending/consulting [facility] provider listed and b) the [facility] team listed Log into [email or web address] and use [facility]'s universal password to access. If you do not have the password, please contact the hospital operator. Locate the [facility] provider you are looking for under [facility] and page to a number that you can be directly reached. If you still have difficulty reaching the provider, please page the [facility] (Director on Call) for the  Hospitalists listed on amion for assistance.  05/21/2023, 1:10 PM

## 2023-05-21 NOTE — ED Notes (Signed)
Pt is alert and oriented, he states that the stretcher is uncomfortable. Pt repositioned.  Pt has pulled out his IV.  Oatmeal and coffee given for breakfast

## 2023-05-22 DIAGNOSIS — F32A Depression, unspecified: Secondary | ICD-10-CM | POA: Diagnosis present

## 2023-05-22 DIAGNOSIS — E1122 Type 2 diabetes mellitus with diabetic chronic kidney disease: Secondary | ICD-10-CM | POA: Diagnosis present

## 2023-05-22 DIAGNOSIS — M4714 Other spondylosis with myelopathy, thoracic region: Secondary | ICD-10-CM | POA: Diagnosis present

## 2023-05-22 DIAGNOSIS — N390 Urinary tract infection, site not specified: Secondary | ICD-10-CM | POA: Diagnosis present

## 2023-05-22 DIAGNOSIS — J449 Chronic obstructive pulmonary disease, unspecified: Secondary | ICD-10-CM | POA: Diagnosis present

## 2023-05-22 DIAGNOSIS — G4733 Obstructive sleep apnea (adult) (pediatric): Secondary | ICD-10-CM | POA: Diagnosis present

## 2023-05-22 DIAGNOSIS — Z6841 Body Mass Index (BMI) 40.0 and over, adult: Secondary | ICD-10-CM | POA: Diagnosis not present

## 2023-05-22 DIAGNOSIS — I129 Hypertensive chronic kidney disease with stage 1 through stage 4 chronic kidney disease, or unspecified chronic kidney disease: Secondary | ICD-10-CM | POA: Diagnosis present

## 2023-05-22 DIAGNOSIS — Z8249 Family history of ischemic heart disease and other diseases of the circulatory system: Secondary | ICD-10-CM | POA: Diagnosis not present

## 2023-05-22 DIAGNOSIS — G2581 Restless legs syndrome: Secondary | ICD-10-CM | POA: Diagnosis present

## 2023-05-22 DIAGNOSIS — K5732 Diverticulitis of large intestine without perforation or abscess without bleeding: Secondary | ICD-10-CM | POA: Diagnosis present

## 2023-05-22 DIAGNOSIS — F17210 Nicotine dependence, cigarettes, uncomplicated: Secondary | ICD-10-CM | POA: Diagnosis present

## 2023-05-22 DIAGNOSIS — Z86718 Personal history of other venous thrombosis and embolism: Secondary | ICD-10-CM | POA: Diagnosis not present

## 2023-05-22 DIAGNOSIS — G8929 Other chronic pain: Secondary | ICD-10-CM | POA: Diagnosis present

## 2023-05-22 DIAGNOSIS — Z993 Dependence on wheelchair: Secondary | ICD-10-CM | POA: Diagnosis not present

## 2023-05-22 DIAGNOSIS — K5792 Diverticulitis of intestine, part unspecified, without perforation or abscess without bleeding: Secondary | ICD-10-CM | POA: Diagnosis not present

## 2023-05-22 DIAGNOSIS — R112 Nausea with vomiting, unspecified: Secondary | ICD-10-CM | POA: Diagnosis present

## 2023-05-22 DIAGNOSIS — N189 Chronic kidney disease, unspecified: Secondary | ICD-10-CM | POA: Diagnosis present

## 2023-05-22 DIAGNOSIS — K219 Gastro-esophageal reflux disease without esophagitis: Secondary | ICD-10-CM | POA: Diagnosis present

## 2023-05-22 DIAGNOSIS — F419 Anxiety disorder, unspecified: Secondary | ICD-10-CM | POA: Diagnosis present

## 2023-05-22 DIAGNOSIS — D509 Iron deficiency anemia, unspecified: Secondary | ICD-10-CM | POA: Diagnosis present

## 2023-05-22 DIAGNOSIS — E66813 Obesity, class 3: Secondary | ICD-10-CM | POA: Diagnosis present

## 2023-05-22 DIAGNOSIS — E785 Hyperlipidemia, unspecified: Secondary | ICD-10-CM | POA: Diagnosis present

## 2023-05-22 DIAGNOSIS — S82409A Unspecified fracture of shaft of unspecified fibula, initial encounter for closed fracture: Secondary | ICD-10-CM | POA: Diagnosis present

## 2023-05-22 DIAGNOSIS — Z882 Allergy status to sulfonamides status: Secondary | ICD-10-CM | POA: Diagnosis not present

## 2023-05-22 DIAGNOSIS — N179 Acute kidney failure, unspecified: Secondary | ICD-10-CM | POA: Diagnosis present

## 2023-05-22 LAB — GLUCOSE, CAPILLARY
Glucose-Capillary: 118 mg/dL — ABNORMAL HIGH (ref 70–99)
Glucose-Capillary: 97 mg/dL (ref 70–99)
Glucose-Capillary: 114 mg/dL — ABNORMAL HIGH (ref 70–99)
Glucose-Capillary: 119 mg/dL — ABNORMAL HIGH (ref 70–99)

## 2023-05-22 LAB — COMPREHENSIVE METABOLIC PANEL
ALT: 14 U/L (ref 0–44)
AST: 13 U/L — ABNORMAL LOW (ref 15–41)
Albumin: 2.7 g/dL — ABNORMAL LOW (ref 3.5–5.0)
Alkaline Phosphatase: 78 U/L (ref 38–126)
Anion gap: 10 (ref 5–15)
BUN: 17 mg/dL (ref 6–20)
CO2: 24 mmol/L (ref 22–32)
Calcium: 9.2 mg/dL (ref 8.9–10.3)
Chloride: 108 mmol/L (ref 98–111)
Creatinine, Ser: 1.85 mg/dL — ABNORMAL HIGH (ref 0.61–1.24)
GFR, Estimated: 41 mL/min — ABNORMAL LOW (ref >60)
Glucose, Bld: 122 mg/dL — ABNORMAL HIGH (ref 70–99)
Potassium: 3.5 mmol/L (ref 3.5–5.1)
Sodium: 142 mmol/L (ref 135–145)
Total Bilirubin: 0.4 mg/dL (ref <1.2)
Total Protein: 6.4 g/dL — ABNORMAL LOW (ref 6.5–8.1)

## 2023-05-22 LAB — CBC
HCT: 31.5 % — ABNORMAL LOW (ref 39.0–52.0)
Hemoglobin: 10.2 g/dL — ABNORMAL LOW (ref 13.0–17.0)
MCH: 29 pg (ref 26.0–34.0)
MCHC: 32.4 g/dL (ref 30.0–36.0)
MCV: 89.5 fL (ref 80.0–100.0)
Platelets: 354 10*3/uL (ref 150–400)
RBC: 3.52 MIL/uL — ABNORMAL LOW (ref 4.22–5.81)
RDW: 15.8 % — ABNORMAL HIGH (ref 11.5–15.5)
WBC: 9.7 10*3/uL (ref 4.0–10.5)
nRBC: 0 % (ref 0.0–0.2)

## 2023-05-22 LAB — URINE CULTURE: Culture: <10000 — AB

## 2023-05-22 NOTE — Evaluation (Signed)
Occupational Therapy Evaluation Patient Details Name: Raymond [name] MRN: [id number] DOB: 10/18/1965 Today's Date: 05/22/2023   History of Present Illness 57 y.o. male presenting to [facility] on 05/20/23 with abdominal pain and hematuria, being worked up for acute diverticulitis. PMH of anxiety, COPD, DM, Edema, Gout, HTN, HLD, L medial menisectomy, ORIF L radius and ulna, T10 laminectomy.   Clinical Impression   Pt admitted for above, at baseline he transfers to w/c via slide board and reports having some assist as needed from spouse. Pt still on back precautions per report, encouraged him to follow-up with MD as he has been on them for 2 months. Pt needing significant assist for LB ADLs and toileting but uses AE at baseline to aide in task performance. OT to continue following pt acutely to address deficits and promote continuum of care. Recommend pt continue with Outpatient OT services at [place].        If plan is discharge home, recommend the following: A lot of help with walking and/or transfers;Two people to help with walking and/or transfers;A lot of help with bathing/dressing/bathroom;Assist for transportation;Assistance with cooking/housework    Functional Status Assessment  Patient has had a recent decline in their functional status and demonstrates the ability to make significant improvements in function in a reasonable and predictable amount of time.  Equipment Recommendations  Other (comment) (Pt has rec DME)    Recommendations for Other Services       Precautions / Restrictions Precautions Precautions: Fall;Back Precaution Booklet Issued: No Restrictions Weight Bearing Restrictions Per Provider Order: No Other Position/Activity Restrictions: no brace, pt states still on precautions      Mobility Bed Mobility Overal bed mobility: Needs Assistance Bed Mobility: Rolling, Sidelying to Sit, Sit to Sidelying Rolling: Supervision, Used rails Sidelying to sit: Supervision, HOB  elevated, Used rails     Sit to sidelying: Supervision, Used rails General bed mobility comments: cues for log roll, pt did not use, able to complete without physial assist with use of bed rails    Transfers Overall transfer level: Needs assistance Equipment used: Ambulation equipment used Transfers: Sit to/from Stand, Bed to chair/wheelchair/BSC Sit to Stand: Mod assist, +2 physical assistance, From elevated surface, Via lift equipment          Lateral/Scoot Transfers: Supervision General transfer comment: used stedy to complete sit-stand from EOB, then sit-stand from stedy flaps x2. pt with single unexpected sit to stedy flaps due to LE spasm and fatigue. able to scoot minimally along EOB, poor clearance of hips Transfer via Lift Equipment: Stedy    Balance Overall balance assessment: Needs assistance Sitting-balance support: Bilateral upper extremity supported, Feet supported Sitting balance-Leahy Scale: Fair     Standing balance support: Bilateral upper extremity supported, During functional activity Standing balance-Leahy Scale: Poor Standing balance comment: dependent on BUE support, bilateral knee blocking, and limited tolerance                           ADL either performed or assessed with clinical judgement   ADL Overall ADL's : Needs assistance/impaired Eating/Feeding: Independent;Sitting   Grooming: Sitting;Set up   Upper Body Bathing: Sitting;Set up   Lower Body Bathing: Sitting/lateral leans;Moderate assistance   Upper Body Dressing : Sitting;Set up   Lower Body Dressing: Maximal assistance;Sitting/lateral leans   Toilet Transfer: Moderate assistance;+2 for physical assistance;+2 for safety/equipment Toilet Transfer Details (indicate cue type and reason): Would need Stedy to transfer onto [facility] Toileting- Clothing Manipulation and Hygiene:  Maximal assistance;Sitting/lateral lean Toileting - Clothing Manipulation Details (indicate cue type and  reason): Uses hand held bidet at baseline             Vision         Perception         Praxis         Pertinent Vitals/Pain Pain Assessment Pain Assessment: Faces Faces Pain Scale: Hurts little more Pain Location: back with movement Pain Descriptors / Indicators: Discomfort, Grimacing Pain Intervention(s): Monitored during session, Repositioned     Extremity/Trunk Assessment Upper Extremity Assessment Upper Extremity Assessment: Overall WFL for tasks assessed   Lower Extremity Assessment Lower Extremity Assessment: Generalized weakness (grossly 4+/5 to MMT, but limited functional strength/power relative to pts body weight. pt reports BLE ar tingly, not numb, but no ability to detect touch to bilateral toes.)   Cervical / Trunk Assessment Cervical / Trunk Assessment: Back Surgery;Other exceptions Cervical / Trunk Exceptions: hx of back surgery, large body habitus   Communication Communication Communication: No apparent difficulties Cueing Techniques: Verbal cues   Cognition Arousal: Alert Behavior During Therapy: WFL for tasks assessed/performed Overall Cognitive Status: Within Functional Limits for tasks assessed                                       General Comments  VSS on RA, RN present and giving pain medication    Exercises     Shoulder Instructions      Home Living Family/patient expects to be discharged to:: Private residence Living Arrangements: Spouse/significant other Available Help at Discharge: Family;Neighbor Type of Home: House Home Access: Ramped entrance (R rail)     Home Layout: One level     Bathroom Shower/Tub: Chief Strategy Officer: Handicapped height (1 elevated, 1 standard) Bathroom Accessibility: No (w/c will not fit thru door, rollator will fit)   Home Equipment: Rolling Walker (2 wheels);BSC/3in1;Wheelchair - manual;Other (comment);Rollator (4 wheels);Hospital bed   Additional Comments: Pt  has a hand held bidet      Prior Functioning/Environment Prior Level of Function : Needs assist       Physical Assist : Mobility (physical);ADLs (physical) Mobility (physical): Transfers ADLs (physical): Bathing;Dressing Mobility Comments: Pt uses lateral scoots to transfer to/from commode. Uses slide board to transfer to w/c ind. ADLs Comments: wife assists with bathing and dressing.        OT Problem List: Pain;Decreased strength;Impaired balance (sitting and/or standing);Obesity;Impaired sensation      OT Treatment/Interventions: Self-care/ADL training;Other (comment);Therapeutic exercise;Therapeutic activities;DME and/or AE instruction;Balance training    OT Goals(Current goals can be found in the care plan section) Acute Rehab OT Goals Patient Stated Goal: To get better OT Goal Formulation: With patient Time For Goal Achievement: 06/05/23 Potential to Achieve Goals: Good ADL Goals Pt Will Perform Lower Body Bathing: sitting/lateral leans;with adaptive equipment;with set-up Pt Will Perform Lower Body Dressing: with set-up;sitting/lateral leans;with adaptive equipment Pt Will Perform Toileting - Clothing Manipulation and hygiene: with modified independence;sitting/lateral leans Pt/caregiver will Perform Home Exercise Program: Both right and left upper extremity;With theraband;Increased strength;Independently;With written HEP provided Additional ADL Goal #2: Pt will demonstrate proper use of breathing techniques to reduce holding breath during functional activity  OT Frequency: Min 1X/week    Co-evaluation PT/OT/SLP Co-Evaluation/Treatment: Yes Reason for Co-Treatment: Complexity of the patient's impairments (multi-system involvement);Necessary to address cognition/behavior during functional activity;For patient/therapist safety;To address functional/ADL transfers PT goals addressed during session:  Mobility/safety with mobility;Balance;Strengthening/ROM OT goals addressed  during session: ADL's and self-care      AM-PAC OT "6 Clicks" Daily Activity     Outcome Measure Help from another person eating meals?: None Help from another person taking care of personal grooming?: A Little Help from another person toileting, which includes using toliet, bedpan, or urinal?: A Lot Help from another person bathing (including washing, rinsing, drying)?: A Lot Help from another person to put on and taking off regular upper body clothing?: A Little Help from another person to put on and taking off regular lower body clothing?: A Lot 6 Click Score: 16   End of Session Equipment Utilized During Treatment: Gait belt;Other (comment) Antony Salmon) Nurse Communication: Mobility status  Activity Tolerance: Patient tolerated treatment well Patient left: in bed;with call bell/phone within reach  OT Visit Diagnosis: Muscle weakness (generalized) (M62.81);Unsteadiness on feet (R26.81);Pain Pain - Right/Left:  (abdominal)                Time: 2841-3244 OT Time Calculation (min): 46 min Charges:  OT General Charges $OT Visit: 1 Visit OT Evaluation $OT Eval Moderate Complexity: 1 Mod  05/22/2023  AB, OTR/L  Acute Rehabilitation Services  Office: [phone]   Tristan Schroeder 05/22/2023, 4:40 PM

## 2023-05-22 NOTE — Progress Notes (Signed)
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 with OSA possible restrictive lung disease HTN DM TY 2 Prior displaced right-sided fibular fracture Large herniated pulposis T10-T11 status post discectomy 03/12/2023 discharged to rehab 03/23/2023-acute renal failure--Developed DVT at the hospital stay placed on Eliquis  Came to emergency room 12/18 with hematuria as well as abdominal pain associated nausea vomiting no chills no rigors  BUN/creatinine 27/2.0 >baseline of about 1.3 White count 11.7-LFTs within normal limits CT ABD pelvis acute uncomplicated sigmoid diverticulitis nondistended bladder UA showed large leukocytes negative nitrites few bacteria      Plan  Acute diverticulitis +/- cystitis Patient's urine has cleared up some not sure if he has a urinary infection In either event continue ceftriaxone Flagyl 2 g/500 every 12, keep full liquid diet-reassess for tolerance a.m. Likely de-escalate antibiotics in 1 to 2 days if clinically better  AKI Continue saline 40 cc/H-kidney function is improved and can saline lock in about 5 to 6 hours and force fluids Losartan from PTA 50 mg has been held, would hold Lasix 80 twice daily Given improvement of kidney function do not think he is retaining continue Flomax 0.4  Previous herniated nucleus pulposus status post discectomy 03/12/2023-currently wheelchair-bound Has all equipment at home-uses a ramp and wheelchair-anticipate discharge back home Continue baclofen 10 3 times daily as needed spasm  Recent DVT 03/2023 Continue Eliquis 5 twice daily required at least through January 2024  Class III obesity DM TY 2 Takes Mounjaro 10 once weekly at home which has been held CBGs  ranging between 110 and 140-no need for coverage at this time  HTN Hold losartan Lasix as above Continue Coreg 25 twice daily  BPH As above-also is on Cardura 2 mg at bedtime would continue the same  RLS Continue pramipexole 0.5 twice daily  OSA with restrictive lung disease additionally Continue mandatory CPAP BiPAP at night  Depression continue Cymbalta 30 daily, Xanax 0.5 twice daily as needed anxiety  DVT prophylaxis: Eliquis twice daily  Status is: Observation The patient will require care spanning > 2 midnights and should be moved to inpatient because:  Will require further management      Subjective: Awake coherent no distress looks fair no pain Hematuria has cleared up No distress no nausea vomiting tolerating liquid diet No abdominal pain  Objective + exam Vitals:   05/21/23 2152 05/22/23 0439 05/22/23 0500 05/22/23 0721  BP: (!) 174/87 (!) 165/86  (!) 169/92  Pulse:  88  68  Resp:  20  19  Temp:  97.9 F (36.6 C)  98.2 F (36.8 C)  TempSrc:  Oral  Oral  SpO2:  95%  97%  Weight:   (!) 149.1 kg   Height:       Filed Weights   05/20/23 2115 05/21/23 1300 05/22/23 0500  Weight: (!) 160.6 kg (!) 148.4 kg (!) 149.1 kg    Examination:  Thick neck Mallampati 4 No icterus no pallor S1-S2 no murmur ROM intact no focal deficit he is swollen and lower extremities Power 5/5 Psych euthymic little anxious  Data Reviewed: reviewed   CBC    Component Value Date/Time   WBC 9.7 05/22/2023 0610   RBC 3.52 (L) 05/22/2023 8841  HGB 10.2 (L) 05/22/2023 0610   HCT 31.5 (L) 05/22/2023 0610   PLT 354 05/22/2023 0610   MCV 89.5 05/22/2023 0610   MCH 29.0 05/22/2023 0610   MCHC 32.4 05/22/2023 0610   RDW 15.8 (H) 05/22/2023 0610   LYMPHSABS 2.3 05/20/2023 2117   MONOABS 0.8 05/20/2023 2117   EOSABS 0.6 (H) 05/20/2023 2117   BASOSABS 0.1 05/20/2023 2117      Latest Ref Rng & Units 05/22/2023    6:10 AM 05/21/2023    4:28 PM 05/20/2023    9:17 PM  CMP   Glucose 70 - 99 mg/dL 161  096  045   BUN 6 - 20 mg/dL 17  19  27    Creatinine 0.61 - 1.24 mg/dL 4.09  8.11  9.14   Sodium 135 - 145 mmol/L 142  139  138   Potassium 3.5 - 5.1 mmol/L 3.5  3.3  3.9   Chloride 98 - 111 mmol/L 108  107  105   CO2 22 - 32 mmol/L 24  26  23    Calcium 8.9 - 10.3 mg/dL 9.2  9.2  9.2   Total Protein 6.5 - 8.1 g/dL 6.4  6.7    Total Bilirubin <1.2 mg/dL 0.4  0.3    Alkaline Phos 38 - 126 U/L 78  83    AST 15 - 41 U/L 13  14    ALT 0 - 44 U/L 14  14       Scheduled Meds:  apixaban  5 mg Oral BID   atorvastatin  40 mg Oral QPM   carvedilol  25 mg Oral BID WC   doxazosin  2 mg Oral QHS   DULoxetine  30 mg Oral Daily   insulin aspart  0-9 Units Subcutaneous TID WC   lidocaine  2 patch Transdermal Q2200   lidocaine  1 Application Urethral Once   pantoprazole  80 mg Oral Daily   pramipexole  0.5 mg Oral BID   sodium chloride flush  3 mL Intravenous Q12H   tamsulosin  0.4 mg Oral QHS   Continuous Infusions:  sodium chloride Stopped (05/21/23 2211)   cefTRIAXone (ROCEPHIN)  IV Stopped (05/21/23 2241)   metronidazole 500 mg (05/22/23 7829)    Time 46  Rhetta Mura, MD  [facility]

## 2023-05-22 NOTE — Evaluation (Signed)
Physical Therapy Evaluation Patient Details Name: Raymond [name] MRN: [id number] DOB: 07/07/1965 Today's Date: 05/22/2023  History of Present Illness  57 y.o. male presenting to [facility] on 05/20/23 with abdominal pain and hematuria, being worked up for acute diverticulitis. PMH of anxiety, COPD, DM, Edema, Gout, HTN, HLD, L medial menisectomy, ORIF L radius and ulna, T10 laminectomy.   Clinical Impression  Pt in bed upon arrival of PT, agreeable to evaluation at this time. Prior to admission the pt was completing lateral scoot transfers with use of slideboard, and primarily WC bound for mobility at home. He was active with OPPT working on LE strengthening and standing tolerance. Pt presents with grossly 4+/5 strength in LE to MMT, but limited functional strength/power relative to pts total body weight, making it challenging for him to complete OOB transfers and activities. The pt was able to complete bed mobility with supervision, but demos poor adherence to log roll despite cues, and then he required modA of 2 for sit-stand transfers with use of stedy. The pt demos poor standing tolerance this session (~30 seconds at a time), and was unable to manage standing wt shifts due to fatigue. He is hopeful to return to standing and gait training eventually, but is likely near his recent baseline and could return home at a [facility] level with family support. PT will continue to follow acutely to progress strength and mobility as possible, no new DME needs.     If plan is discharge home, recommend the following: Two people to help with walking and/or transfers;A lot of help with bathing/dressing/bathroom;Assistance with cooking/housework;Assist for transportation;Help with stairs or ramp for entrance   Can travel by private vehicle        Equipment Recommendations None recommended by PT (pt has needed DME)  Recommendations for Other Services       Functional Status Assessment Patient has had a recent decline in  their functional status and demonstrates the ability to make significant improvements in function in a reasonable and predictable amount of time.     Precautions / Restrictions Precautions Precautions: Fall;Back Precaution Booklet Issued: No Restrictions Weight Bearing Restrictions Per Provider Order: No Other Position/Activity Restrictions: no brace, pt states still on precautions      Mobility  Bed Mobility Overal bed mobility: Needs Assistance Bed Mobility: Rolling, Sidelying to Sit, Sit to Sidelying Rolling: Supervision, Used rails Sidelying to sit: Supervision, HOB elevated, Used rails     Sit to sidelying: Supervision, Used rails General bed mobility comments: cues for log roll, pt did not use, able to complete without physial assist with use of bed rails    Transfers Overall transfer level: Needs assistance Equipment used: Ambulation equipment used Transfers: Sit to/from Stand, Bed to chair/wheelchair/BSC Sit to Stand: Mod assist, +2 physical assistance, From elevated surface, Via lift equipment          Lateral/Scoot Transfers: Supervision General transfer comment: used stedy to complete sit-stand from EOB, then sit-stand from stedy flaps x2. pt with single unexpected sit to stedy flaps due to LE spasm and fatigue. able to scoot minimally along EOB, poor clearance of hips Transfer via Lift Equipment: Stedy  Ambulation/Gait             Pre-gait activities: attempted standing wt shift, pt unable today General Gait Details: unable at baseline     Balance Overall balance assessment: Needs assistance Sitting-balance support: Bilateral upper extremity supported, Feet supported Sitting balance-Leahy Scale: Fair     Standing balance support: Bilateral upper  extremity supported, During functional activity Standing balance-Leahy Scale: Poor Standing balance comment: dependent on BUE support, bulateral knee blocking, and limited tolerance                              Pertinent Vitals/Pain Pain Assessment Pain Assessment: Faces Faces Pain Scale: Hurts little more Pain Location: back with movement Pain Descriptors / Indicators: Discomfort, Grimacing Pain Intervention(s): Limited activity within patient's tolerance, Monitored during session, Repositioned    Home Living Family/patient expects to be discharged to:: Private residence Living Arrangements: Spouse/significant other Available Help at Discharge: Family;Neighbor Type of Home: House Home Access: Ramped entrance (R rail)       Home Layout: One level Home Equipment: Agricultural consultant (2 wheels);BSC/3in1;Wheelchair - Careers adviser (comment);Rollator (4 wheels);Hospital bed Additional Comments: Pt has a hand held bidet    Prior Function Prior Level of Function : Needs assist       Physical Assist : Mobility (physical);ADLs (physical) Mobility (physical): Transfers ADLs (physical): Bathing;Dressing Mobility Comments: Pt uses lateral scoots to transfer to/from commode. Uses slide board to transfer to w/c ind. ADLs Comments: wife assists with bathing and dressing.     Extremity/Trunk Assessment   Upper Extremity Assessment Upper Extremity Assessment: Defer to OT evaluation    Lower Extremity Assessment Lower Extremity Assessment: Generalized weakness (grossly 4+/5 to MMT, but limited functional strength/power relative to pts body weight. pt reports BLE ar tingly, not numb, but no ability to detect touch to bilateral toes.)    Cervical / Trunk Assessment Cervical / Trunk Assessment: Back Surgery;Other exceptions Cervical / Trunk Exceptions: hx of back surgery, large body habitus  Communication   Communication Communication: No apparent difficulties Cueing Techniques: Verbal cues  Cognition Arousal: Alert Behavior During Therapy: WFL for tasks assessed/performed Overall Cognitive Status: Within Functional Limits for tasks assessed                                           General Comments General comments (skin integrity, edema, etc.): VSS on RA, RN present and giving pain medication    Exercises Other Exercises Other Exercises: LE toe tapping to bottle, slowed movements, increased challenge with lifting and coordinating tapping   Assessment/Plan    PT Assessment Patient needs continued PT services  PT Problem List Decreased strength;Decreased range of motion;Decreased activity tolerance;Decreased balance;Decreased mobility;Decreased coordination;Pain       PT Treatment Interventions Gait training;DME instruction;Functional mobility training;Therapeutic activities;Therapeutic exercise;Balance training;Neuromuscular re-education    PT Goals (Current goals can be found in the Care Plan section)  Acute Rehab PT Goals Patient Stated Goal: to improve strength to be able to stand, attend aquatic therapy in future PT Goal Formulation: With patient Time For Goal Achievement: 06/05/23 Potential to Achieve Goals: Good    Frequency Min 1X/week     Co-evaluation PT/OT/SLP Co-Evaluation/Treatment: Yes Reason for Co-Treatment: Complexity of the patient's impairments (multi-system involvement);Necessary to address cognition/behavior during functional activity;For patient/therapist safety;To address functional/ADL transfers PT goals addressed during session: Mobility/safety with mobility;Balance;Strengthening/ROM         AM-PAC PT "6 Clicks" Mobility  Outcome Measure Help needed turning from your back to your side while in a flat bed without using bedrails?: None Help needed moving from lying on your back to sitting on the side of a flat bed without using bedrails?: None Help needed moving to and  from a bed to a chair (including a wheelchair)?: A Lot Help needed standing up from a chair using your arms (e.g., wheelchair or bedside chair)?: A Lot Help needed to walk in hospital room?: Total Help needed climbing 3-5 steps with a railing? :  Total 6 Click Score: 14    End of Session Equipment Utilized During Treatment: Gait belt Activity Tolerance: Patient tolerated treatment well Patient left: in bed;with call bell/phone within reach Nurse Communication: Mobility status PT Visit Diagnosis: Unsteadiness on feet (R26.81);Other abnormalities of gait and mobility (R26.89);Muscle weakness (generalized) (M62.81);Pain Pain - part of body: Leg    Time: 1310-1359 PT Time Calculation (min) (ACUTE ONLY): 49 min   Charges:   PT Evaluation $PT Eval Moderate Complexity: 1 Mod PT Treatments $Therapeutic Exercise: 8-22 mins PT General Charges $$ ACUTE PT VISIT: 1 Visit         Vickki Muff, PT, DPT   Acute Rehabilitation Department Office [phone] Secure Chat Communication Preferred  Raymond [name] 05/22/2023, 2:51 PM

## 2023-05-22 NOTE — Progress Notes (Signed)
   05/22/23 2020  BiPAP/CPAP/SIPAP  $ Non-Invasive Home Ventilator  Subsequent  BiPAP/CPAP/SIPAP Pt Type Adult  BiPAP/CPAP/SIPAP Resmed  Mask Type Full face mask  Mask Size Large  IPAP 20 cmH20  EPAP 16 cmH2O  FiO2 (%) 21 %  Patient Home Equipment Yes  Safety Check Completed by RT for Home Unit Yes, no issues noted

## 2023-05-22 NOTE — Plan of Care (Signed)
  Problem: Fluid Volume: Goal: Ability to maintain a balanced intake and output will improve Outcome: Progressing   Problem: Health Behavior/Discharge Planning: Goal: Ability to identify and utilize available resources and services will improve Outcome: Progressing   Problem: Nutritional: Goal: Maintenance of adequate nutrition will improve Outcome: Progressing   Problem: Education: Goal: Knowledge of General Education information will improve Description: Including pain rating scale, medication(s)/side effects and non-pharmacologic comfort measures Outcome: Progressing

## 2023-05-23 DIAGNOSIS — K5792 Diverticulitis of intestine, part unspecified, without perforation or abscess without bleeding: Secondary | ICD-10-CM | POA: Diagnosis not present

## 2023-05-23 LAB — COMPREHENSIVE METABOLIC PANEL
ALT: 14 U/L (ref 0–44)
AST: 15 U/L (ref 15–41)
Albumin: 2.5 g/dL — ABNORMAL LOW (ref 3.5–5.0)
Alkaline Phosphatase: 74 U/L (ref 38–126)
Anion gap: 7 (ref 5–15)
BUN: 15 mg/dL (ref 6–20)
CO2: 24 mmol/L (ref 22–32)
Calcium: 8.8 mg/dL — ABNORMAL LOW (ref 8.9–10.3)
Chloride: 105 mmol/L (ref 98–111)
Creatinine, Ser: 1.59 mg/dL — ABNORMAL HIGH (ref 0.61–1.24)
GFR, Estimated: 50 mL/min — ABNORMAL LOW (ref >60)
Glucose, Bld: 103 mg/dL — ABNORMAL HIGH (ref 70–99)
Potassium: 3.1 mmol/L — ABNORMAL LOW (ref 3.5–5.1)
Sodium: 136 mmol/L (ref 135–145)
Total Bilirubin: 0.4 mg/dL (ref <1.2)
Total Protein: 6.1 g/dL — ABNORMAL LOW (ref 6.5–8.1)

## 2023-05-23 LAB — CBC
HCT: 31.6 % — ABNORMAL LOW (ref 39.0–52.0)
Hemoglobin: 9.9 g/dL — ABNORMAL LOW (ref 13.0–17.0)
MCH: 28.8 pg (ref 26.0–34.0)
MCHC: 31.3 g/dL (ref 30.0–36.0)
MCV: 91.9 fL (ref 80.0–100.0)
Platelets: 342 10*3/uL (ref 150–400)
RBC: 3.44 MIL/uL — ABNORMAL LOW (ref 4.22–5.81)
RDW: 15.7 % — ABNORMAL HIGH (ref 11.5–15.5)
WBC: 9.4 10*3/uL (ref 4.0–10.5)
nRBC: 0 % (ref 0.0–0.2)

## 2023-05-23 LAB — GLUCOSE, CAPILLARY
Glucose-Capillary: 104 mg/dL — ABNORMAL HIGH (ref 70–99)
Glucose-Capillary: 107 mg/dL — ABNORMAL HIGH (ref 70–99)
Glucose-Capillary: 119 mg/dL — ABNORMAL HIGH (ref 70–99)
Glucose-Capillary: 121 mg/dL — ABNORMAL HIGH (ref 70–99)

## 2023-05-23 MED ORDER — POTASSIUM CHLORIDE CRYS ER 20 MEQ PO TBCR
40.0000 meq | EXTENDED_RELEASE_TABLET | Freq: Every day | ORAL | Status: DC
  Administered 2023-05-23 – 2023-05-25 (×3): 40 meq via ORAL
  Filled 2023-05-23 (×3): qty 2

## 2023-05-23 NOTE — Plan of Care (Signed)
CPI

## 2023-05-23 NOTE — Progress Notes (Signed)
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 with OSA possible restrictive lung disease HTN DM TY 2 Prior displaced right-sided fibular fracture Large herniated pulposis T10-T11 status post discectomy 03/12/2023 discharged to rehab 03/23/2023-acute renal failure--Developed DVT at the hospital stay placed on Eliquis  Came to emergency room 12/18 with hematuria as well as abdominal pain associated nausea vomiting no chills no rigors  BUN/creatinine 27/2.0 >baseline of about 1.3 White count 11.7-LFTs within normal limits CT ABD pelvis acute uncomplicated sigmoid diverticulitis nondistended bladder UA showed large leukocytes negative nitrites few bacteria  Improving likely can discharge improving    Plan  Acute diverticulitis on admission--not UTI as [place] <10,000 Continue ceftriaxone Flagyl IV for now and full liquid diet Likely de-escalate to oral antibiotics and graduate diet in a.m. pain is decreased Pain controlled on Norco 1-2 every 4 as needed-for severe pain can use Dilaudid 0.5-1 every 3 as needed  AKI on admission  2/2 PTA losartan 50 Lasix 80 twice daily-monitor trends and likely saline lock in a.m. Continue Flomax for obstructive component  Previous herniated nucleus pulposus with discectomy 03/12/2023 and wheelchair-bound Has equipment at home and could not return there continue baclofen 10 3 times daily needed back spasm  Recent DVT 03/2023 Continues Eliquis through at least 06/2023  Class II obesity DM TY 2 Continue Mounjaro 10 once weekly at home CBGs currently 90s to 120  HTN Meds as above on hold Continues on Coreg 25  BPH Continue Cardura 2  RLS continue pramipexole 0.5 3 times daily  OSA with  restrictive lung disease additionally CPAP at night Encouraged weight loss as an outpatient  Depression Continue Cymbalta 30, Xanax 0.5 twice daily anxiety  Discussed with wife [name] on phone  DVT prophylaxis: Eliquis twice daily  Status is: Observation The patient will require care spanning > 2 midnights and should be moved to inpatient because:    Likely d/c home in 1-2 days      Subjective: Awake coherent no distress looks fair no pain Hematuria has cleared up No distress no nausea vomiting tolerating liquid diet No abdominal pain  Objective + exam Vitals:   05/22/23 2012 05/23/23 0450 05/23/23 0454 05/23/23 0741  BP: (!) 154/79 131/72  (!) 157/74  Pulse: 61 (!) 52  68  Resp: 17 17  19   Temp: 97.6 F (36.4 C) (!) 97.5 F (36.4 C)  98.2 F (36.8 C)  TempSrc: Oral Oral  Oral  SpO2: 97% 92%  95%  Weight:   (!) 149 kg   Height:       Filed Weights   05/21/23 1300 05/22/23 0500 05/23/23 0454  Weight: (!) 148.4 kg (!) 149.1 kg (!) 149 kg    Examination:  Thick neck Mallampati 4 No icterus no pallor S1-S2 no murmur ROM intact no focal deficit he is swollen and lower extremities Power 5/5  Data Reviewed: reviewed   CBC    Component Value Date/Time   WBC 9.4 05/23/2023 0304   RBC 3.44 (L) 05/23/2023 0304   HGB 9.9 (L) 05/23/2023 0304   HCT 31.6 (L) 05/23/2023 0304   PLT 342 05/23/2023 0304   MCV 91.9 05/23/2023 0304   MCH 28.8 05/23/2023 0304   MCHC 31.3  05/23/2023 0304   RDW 15.7 (H) 05/23/2023 0304   LYMPHSABS 2.3 05/20/2023 2117   MONOABS 0.8 05/20/2023 2117   EOSABS 0.6 (H) 05/20/2023 2117   BASOSABS 0.1 05/20/2023 2117      Latest Ref Rng & Units 05/23/2023    3:04 AM 05/22/2023    6:10 AM 05/21/2023    4:28 PM  CMP  Glucose 70 - 99 mg/dL 841  324  401   BUN 6 - 20 mg/dL 15  17  19    Creatinine 0.61 - 1.24 mg/dL 0.27  2.53  6.64   Sodium 135 - 145 mmol/L 136  142  139   Potassium 3.5 - 5.1 mmol/L 3.1  3.5  3.3   Chloride 98 - 111  mmol/L 105  108  107   CO2 22 - 32 mmol/L 24  24  26    Calcium 8.9 - 10.3 mg/dL 8.8  9.2  9.2   Total Protein 6.5 - 8.1 g/dL 6.1  6.4  6.7   Total Bilirubin <1.2 mg/dL 0.4  0.4  0.3   Alkaline Phos 38 - 126 U/L 74  78  83   AST 15 - 41 U/L 15  13  14    ALT 0 - 44 U/L 14  14  14       Scheduled Meds:  apixaban  5 mg Oral BID   atorvastatin  40 mg Oral QPM   carvedilol  25 mg Oral BID WC   doxazosin  2 mg Oral QHS   DULoxetine  30 mg Oral Daily   insulin aspart  0-9 Units Subcutaneous TID WC   lidocaine  2 patch Transdermal Q2200   lidocaine  1 Application Urethral Once   pantoprazole  80 mg Oral Daily   potassium chloride  40 mEq Oral Daily   pramipexole  0.5 mg Oral BID   sodium chloride flush  3 mL Intravenous Q12H   tamsulosin  0.4 mg Oral QHS   Continuous Infusions:  cefTRIAXone (ROCEPHIN)  IV 2 g (05/22/23 2219)   metronidazole 500 mg (05/23/23 0349)    Time 46  Rhetta Mura, MD  [facility]

## 2023-05-23 NOTE — Progress Notes (Signed)
   05/23/23 2012  BiPAP/CPAP/SIPAP  $ Non-Invasive Home Ventilator  Subsequent  BiPAP/CPAP/SIPAP Pt Type Adult  BiPAP/CPAP/SIPAP Resmed  Mask Type Full face mask  Mask Size Large  IPAP 20 cmH20  EPAP 16 cmH2O  FiO2 (%) 21 %  Patient Home Equipment Yes

## 2023-05-23 NOTE — Plan of Care (Signed)

## 2023-05-24 DIAGNOSIS — K5792 Diverticulitis of intestine, part unspecified, without perforation or abscess without bleeding: Secondary | ICD-10-CM | POA: Diagnosis not present

## 2023-05-24 LAB — CBC
HCT: 30.7 % — ABNORMAL LOW (ref 39.0–52.0)
Hemoglobin: 9.7 g/dL — ABNORMAL LOW (ref 13.0–17.0)
MCH: 28.8 pg (ref 26.0–34.0)
MCHC: 31.6 g/dL (ref 30.0–36.0)
MCV: 91.1 fL (ref 80.0–100.0)
Platelets: 324 10*3/uL (ref 150–400)
RBC: 3.37 MIL/uL — ABNORMAL LOW (ref 4.22–5.81)
RDW: 15.7 % — ABNORMAL HIGH (ref 11.5–15.5)
WBC: 9.2 10*3/uL (ref 4.0–10.5)
nRBC: 0 % (ref 0.0–0.2)

## 2023-05-24 LAB — GLUCOSE, CAPILLARY
Glucose-Capillary: 107 mg/dL — ABNORMAL HIGH (ref 70–99)
Glucose-Capillary: 110 mg/dL — ABNORMAL HIGH (ref 70–99)
Glucose-Capillary: 128 mg/dL — ABNORMAL HIGH (ref 70–99)
Glucose-Capillary: 125 mg/dL — ABNORMAL HIGH (ref 70–99)

## 2023-05-24 LAB — COMPREHENSIVE METABOLIC PANEL
ALT: 16 U/L (ref 0–44)
AST: 16 U/L (ref 15–41)
Albumin: 2.4 g/dL — ABNORMAL LOW (ref 3.5–5.0)
Alkaline Phosphatase: 69 U/L (ref 38–126)
Anion gap: 7 (ref 5–15)
BUN: 17 mg/dL (ref 6–20)
CO2: 26 mmol/L (ref 22–32)
Calcium: 9.1 mg/dL (ref 8.9–10.3)
Chloride: 106 mmol/L (ref 98–111)
Creatinine, Ser: 1.85 mg/dL — ABNORMAL HIGH (ref 0.61–1.24)
GFR, Estimated: 41 mL/min — ABNORMAL LOW (ref >60)
Glucose, Bld: 101 mg/dL — ABNORMAL HIGH (ref 70–99)
Potassium: 3.3 mmol/L — ABNORMAL LOW (ref 3.5–5.1)
Sodium: 139 mmol/L (ref 135–145)
Total Bilirubin: 0.4 mg/dL (ref <1.2)
Total Protein: 6 g/dL — ABNORMAL LOW (ref 6.5–8.1)

## 2023-05-24 MED ORDER — HYDROXYZINE HCL 10 MG PO TABS
10.0000 mg | ORAL_TABLET | Freq: Three times a day (TID) | ORAL | Status: DC | PRN
  Administered 2023-05-24 – 2023-05-25 (×2): 10 mg via ORAL
  Filled 2023-05-24 (×2): qty 1

## 2023-05-24 MED ORDER — TIZANIDINE HCL 2 MG PO TABS
2.0000 mg | ORAL_TABLET | Freq: Every day | ORAL | Status: DC
  Administered 2023-05-24: 2 mg via ORAL
  Filled 2023-05-24: qty 1

## 2023-05-24 MED ORDER — ACETAMINOPHEN 325 MG PO TABS: 325.0000 mg | ORAL_TABLET | ORAL | Status: DC | PRN

## 2023-05-24 MED ORDER — DICLOFENAC SODIUM 1 % EX GEL
4.0000 g | Freq: Four times a day (QID) | CUTANEOUS | Status: DC
  Administered 2023-05-24 – 2023-05-25 (×4): 4 g via TOPICAL
  Filled 2023-05-24: qty 100

## 2023-05-24 MED ORDER — AMOXICILLIN-POT CLAVULANATE 875-125 MG PO TABS
1.0000 | ORAL_TABLET | Freq: Two times a day (BID) | ORAL | Status: DC
  Administered 2023-05-24 – 2023-05-25 (×3): 1 via ORAL
  Filled 2023-05-24 (×3): qty 1

## 2023-05-24 NOTE — Progress Notes (Signed)
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 with OSA possible restrictive lung disease HTN DM TY 2 Prior displaced right-sided fibular fracture Large herniated pulposis T10-T11 status post discectomy 03/12/2023 discharged to rehab 03/23/2023-acute renal failure--Developed DVT at the hospital stay placed on Eliquis  Came to emergency room 12/18 with hematuria as well as abdominal pain associated nausea vomiting no chills no rigors  BUN/creatinine 27/2.0 >baseline of about 1.3 White count 11.7-LFTs within normal limits CT ABD pelvis acute uncomplicated sigmoid diverticulitis nondistended bladder UA showed large leukocytes negative nitrites few bacteria  Improving likely can discharge improving    Plan  Acute diverticulitis on admission--not UTI as [place] <10,000 De-escalate ceftriaxone Flagyl Augmentin 12/22-EOT 12/31--diet graduated to soft-if tolerates can go home in a.m. Pain controlled on Norco 1-2 every 4 as needed-stop Dilaudid observe  Rash to face-probably from the CPAP machine mask--- will place on low-dose Atarax and try to replace the mask  AKI on admission secondary to obstructive component- Meds adjusted this hospital, PTA losartan 50 held, Lasix 80 twice daily held Continues on Coreg 25 twice daily-[place]ertas need to resume these at lower dose in the outpatient setting would hold on discharge  Previous herniated nucleus pulposus with discectomy 03/12/2023 and wheelchair-bound Has equipment at home and could not return there continue baclofen 10 3 times daily needed back spasm  Recent DVT 03/2023 Continues Eliquis through at least 06/2023  Class II obesity DM TY 2 Continue Mounjaro 10 once weekly at home CBGs ranging  120  HTN Meds as above on hold Continues on Coreg 25  BPH Continue Cardura 2, Flomax 0.4 at bedtime  RLS continue pramipexole 0.5 3 times daily  OSA with restrictive lung disease additionally CPAP at night Encouraged weight loss as an outpatient  Depression Continue Cymbalta 30, Xanax 0.5 twice daily anxiety  Discussed with wife [name] on phone on 12/21 and she was listening to our conversation 12/22  DVT prophylaxis: Eliquis twice daily  Status is: Observation The patient will require care spanning > 2 midnights and should be moved to inpatient because:    Likely d/c home in 1-2 days      Subjective:  pain is only 2-3/10 overall he feels better He has no chest pain We have graduated him to a soft diet he has no nausea no vomiting no fever  Objective + exam Vitals:   05/23/23 2004 05/24/23 0459 05/24/23 0508 05/24/23 0732  BP: (!) 163/78  (!) 163/81 (!) 164/84  Pulse: 60  65 71  Resp: 20  20 18   Temp: 98.3 F (36.8 C)  98.2 F (36.8 C) (!) 97.5 F (36.4 C)  TempSrc: Oral   Oral  SpO2: 98%  97% 96%  Weight:  (!) 148 kg    Height:       Filed Weights   05/22/23 0500 05/23/23 0454 05/24/23 0459  Weight: (!) 149.1 kg (!) 149 kg (!) 148 kg    Examination:  Thick neck Mallampati 4--- slight rash to both cheeks No icterus no pallor S1-S2 no murmur ROM intact no focal deficit  mildly swollen lower extremities with mild edema Power 5/5  Data Reviewed: reviewed   CBC    Component Value Date/Time  WBC 9.2 05/24/2023 0448   RBC 3.37 (L) 05/24/2023 0448   HGB 9.7 (L) 05/24/2023 0448   HCT 30.7 (L) 05/24/2023 0448   PLT 324 05/24/2023 0448   MCV 91.1 05/24/2023 0448   MCH 28.8 05/24/2023 0448   MCHC 31.6 05/24/2023 0448   RDW 15.7 (H) 05/24/2023 0448   LYMPHSABS 2.3 05/20/2023 2117   MONOABS 0.8 05/20/2023 2117   EOSABS 0.6 (H) 05/20/2023 2117   BASOSABS 0.1 05/20/2023 2117      Latest Ref Rng & Units 05/24/2023    4:48 AM 05/23/2023    3:04 AM  05/22/2023    6:10 AM  CMP  Glucose 70 - 99 mg/dL 811  914  782   BUN 6 - 20 mg/dL 17  15  17    Creatinine 0.61 - 1.24 mg/dL 9.56  2.13  0.86   Sodium 135 - 145 mmol/L 139  136  142   Potassium 3.5 - 5.1 mmol/L 3.3  3.1  3.5   Chloride 98 - 111 mmol/L 106  105  108   CO2 22 - 32 mmol/L 26  24  24    Calcium 8.9 - 10.3 mg/dL 9.1  8.8  9.2   Total Protein 6.5 - 8.1 g/dL 6.0  6.1  6.4   Total Bilirubin <1.2 mg/dL 0.4  0.4  0.4   Alkaline Phos 38 - 126 U/L 69  74  78   AST 15 - 41 U/L 16  15  13    ALT 0 - 44 U/L 16  14  14       Scheduled Meds:  amoxicillin-clavulanate  1 tablet Oral BID   apixaban  5 mg Oral BID   atorvastatin  40 mg Oral QPM   carvedilol  25 mg Oral BID WC   doxazosin  2 mg Oral QHS   DULoxetine  30 mg Oral Daily   insulin aspart  0-9 Units Subcutaneous TID WC   lidocaine  2 patch Transdermal Q2200   pantoprazole  80 mg Oral Daily   potassium chloride  40 mEq Oral Daily   pramipexole  0.5 mg Oral BID   sodium chloride flush  3 mL Intravenous Q12H   tamsulosin  0.4 mg Oral QHS   Continuous Infusions:    Time 36  Rhetta Mura, MD  [facility]

## 2023-05-24 NOTE — Plan of Care (Signed)
  Problem: Coping: Goal: Ability to adjust to condition or change in health will improve Outcome: Progressing   

## 2023-05-24 NOTE — Plan of Care (Signed)

## 2023-05-25 ENCOUNTER — Ambulatory Visit: Payer: Managed Care, Other (non HMO) | Admitting: Occupational Therapy

## 2023-05-25 ENCOUNTER — Ambulatory Visit: Payer: Managed Care, Other (non HMO) | Admitting: Physical Therapy

## 2023-05-25 DIAGNOSIS — K5792 Diverticulitis of intestine, part unspecified, without perforation or abscess without bleeding: Secondary | ICD-10-CM | POA: Diagnosis not present

## 2023-05-25 LAB — COMPREHENSIVE METABOLIC PANEL
ALT: 18 U/L (ref 0–44)
AST: 19 U/L (ref 15–41)
Albumin: 2.4 g/dL — ABNORMAL LOW (ref 3.5–5.0)
Alkaline Phosphatase: 78 U/L (ref 38–126)
Anion gap: 6 (ref 5–15)
BUN: 18 mg/dL (ref 6–20)
CO2: 26 mmol/L (ref 22–32)
Calcium: 9.2 mg/dL (ref 8.9–10.3)
Chloride: 105 mmol/L (ref 98–111)
Creatinine, Ser: 1.87 mg/dL — ABNORMAL HIGH (ref 0.61–1.24)
GFR, Estimated: 41 mL/min — ABNORMAL LOW (ref >60)
Glucose, Bld: 114 mg/dL — ABNORMAL HIGH (ref 70–99)
Potassium: 4.3 mmol/L (ref 3.5–5.1)
Sodium: 137 mmol/L (ref 135–145)
Total Bilirubin: 0.4 mg/dL (ref <1.2)
Total Protein: 5.9 g/dL — ABNORMAL LOW (ref 6.5–8.1)

## 2023-05-25 LAB — CBC
HCT: 31.1 % — ABNORMAL LOW (ref 39.0–52.0)
Hemoglobin: 9.7 g/dL — ABNORMAL LOW (ref 13.0–17.0)
MCH: 28.7 pg (ref 26.0–34.0)
MCHC: 31.2 g/dL (ref 30.0–36.0)
MCV: 92 fL (ref 80.0–100.0)
Platelets: 313 10*3/uL (ref 150–400)
RBC: 3.38 MIL/uL — ABNORMAL LOW (ref 4.22–5.81)
RDW: 15.5 % (ref 11.5–15.5)
WBC: 9.1 10*3/uL (ref 4.0–10.5)
nRBC: 0 % (ref 0.0–0.2)

## 2023-05-25 LAB — GLUCOSE, CAPILLARY: Glucose-Capillary: 99 mg/dL (ref 70–99)

## 2023-05-25 MED ORDER — AMOXICILLIN-POT CLAVULANATE 875-125 MG PO TABS: 1.0000 | ORAL_TABLET | Freq: Two times a day (BID) | ORAL | 0 refills | Status: AC

## 2023-05-25 NOTE — TOC Transition Note (Addendum)
I agreew ith content of this note and endorse it  Pleas Koch, MD [facility] 10:51 AM   Transition of Care [facility]) - Discharge Note   Patient Details  Name: Raymond [name] MRN: [id number] Date of Birth: [birth year]  Transition of [facility]) CM/SW Contact:  Tom-Johnson, Hershal Coria, RN Phone Number: 05/25/2023, 10:35 AM   Clinical Narrative:     Patient is scheduled for discharge today.  Readmission Risk Assessment done. Outpatient PT/OT resumption of care order and referral, hospital f/u and discharge instructions on AVS. Wife to transport at discharge.  No further TOC needs noted.       Final next level of care: OP Rehab Barriers to Discharge: Barriers Resolved   Patient Goals and CMS Choice Patient states their goals for this hospitalization and ongoing recovery are:: To return home CMS Medicare.gov Compare Post Acute Care list provided to:: Patient Choice offered to / list presented to : Patient      Discharge Placement                Patient to be transferred to facility by: Wife Name of family member notified: Raymond [name]    Discharge Plan and Services Additional resources added to the After Visit Summary for                  DME Arranged: N/A DME Agency: NA       HH Arranged: NA HH Agency: NA        Social Drivers of Health (SDOH) Interventions SDOH Screenings   Food Insecurity: No Food Insecurity (05/21/2023)  Housing: Low Risk  (05/21/2023)  Transportation Needs: No Transportation Needs (05/21/2023)  Utilities: Not At Risk (05/21/2023)  Social Connections: Unknown (01/10/2022)   Received from [facility], [facility]  Stress: No Stress Concern Present (03/21/2022)   Received from [facility]  Tobacco Use: High Risk (05/20/2023)     Readmission Risk Interventions    12/25/2022    4:24 PM  Readmission Risk Prevention Plan  Post Dischage Appt Complete  Medication Screening Complete  Transportation  Screening Complete

## 2023-05-25 NOTE — Progress Notes (Signed)
Physical Therapy Treatment Patient Details Name: Raymond [name] MRN: [id number] DOB: 1966-02-07 Today's Date: 05/25/2023   History of Present Illness 57 y.o. male presenting to [facility] on 05/20/23 with abdominal pain and hematuria, being worked up for acute diverticulitis. PMH of anxiety, COPD, DM, Edema, Gout, HTN, HLD, L medial menisectomy, ORIF L radius and ulna, T10 laminectomy.    PT Comments  Continuing work on functional mobility and activity tolerance;  Session focused on functional transfers, pt performed lateral scoot transfer followed by several sit<>stand trasnfers form varied heights with use of the stedy; Overall, needed assist during scoot transfer to steady recliner; He looks forward to continuing work on strength and sit<>stand transfers;   Noted for DC today; if wife can bring in wheelchair and long sliding board, possible for car transfer --  it's worth considering medical Zenaida Niece transport home    If plan is discharge home, recommend the following: Two people to help with walking and/or transfers;A lot of help with bathing/dressing/bathroom;Assistance with cooking/housework;Assist for transportation;Help with stairs or ramp for entrance   Can travel by private vehicle        Equipment Recommendations  None recommended by PT (pt has needed DME; if wife can bring in wheelchair, consider medical van transport home)    Recommendations for Other Services       Precautions / Restrictions Precautions Precautions: Fall;Back Precaution Booklet Issued: No Restrictions Weight Bearing Restrictions Per Provider Order: No Other Position/Activity Restrictions: no brace, pt states still on precautions     Mobility  Bed Mobility Overal bed mobility: Needs Assistance Bed Mobility: Rolling, Sidelying to Sit, Sit to Sidelying Rolling: Supervision, Used rails Sidelying to sit: Supervision, HOB elevated, Used rails       General bed mobility comments: cues for log roll, pt did not use,  able to complete without physial assist with use of bed rails    Transfers Overall transfer level: Needs assistance Equipment used: Sliding board, Ambulation equipment used Transfers: Bed to chair/wheelchair/BSC, Sit to/from Stand Sit to Stand: Mod assist, +2 physical assistance, From elevated surface, Via lift equipment          Lateral/Scoot Transfers: Supervision, Min assist (physical assist to steady recliner) General transfer comment: used stedy to complete sit-stand from EOB, then sit-stand from stedy flaps x2. pt with single unexpected sit to stedy flaps due to LE spasm and fatigue. able to scoot bed to recliner with armrest down and sliding board; short, inefficient scoots; fatigued post transfer, but did not require physical assist to move his body; cues for more weight shift off of buttocks/hips for scooting Transfer via Lift Equipment: Stedy  Ambulation/Gait                   Stairs             Wheelchair Mobility     Tilt Bed    Modified Rankin (Stroke Patients Only)       Balance     Sitting balance-Leahy Scale: Fair     Standing balance support: Bilateral upper extremity supported, During functional activity Standing balance-Leahy Scale: Poor Standing balance comment: dependent on BUE support, bilateral knee blocking, and limited tolerance                            Cognition Arousal: Alert Behavior During Therapy: WFL for tasks assessed/performed Overall Cognitive Status: Within Functional Limits for tasks assessed  Exercises      General Comments        Pertinent Vitals/Pain Pain Assessment Pain Assessment: Faces Faces Pain Scale: Hurts a little bit Pain Location: back with movement Pain Descriptors / Indicators: Discomfort, Grimacing Pain Intervention(s): Monitored during session    Home Living                          Prior Function             PT Goals (current goals can now be found in the care plan section) Acute Rehab PT Goals Patient Stated Goal: to improve strength to be able to stand, attend aquatic therapy in future PT Goal Formulation: With patient Time For Goal Achievement: 06/05/23 Potential to Achieve Goals: Good Progress towards PT goals: Progressing toward goals    Frequency    Min 1X/week      PT Plan      Co-evaluation              AM-PAC PT "6 Clicks" Mobility   Outcome Measure  Help needed turning from your back to your side while in a flat bed without using bedrails?: None Help needed moving from lying on your back to sitting on the side of a flat bed without using bedrails?: None Help needed moving to and from a bed to a chair (including a wheelchair)?: A Lot Help needed standing up from a chair using your arms (e.g., wheelchair or bedside chair)?: A Lot Help needed to walk in hospital room?: Total Help needed climbing 3-5 steps with a railing? : Total 6 Click Score: 14    End of Session Equipment Utilized During Treatment: Gait belt Activity Tolerance: Patient tolerated treatment well Patient left: with call bell/phone within reach;in chair Nurse Communication: Mobility status PT Visit Diagnosis: Unsteadiness on feet (R26.81);Other abnormalities of gait and mobility (R26.89);Muscle weakness (generalized) (M62.81);Pain Pain - part of body: Leg     Time: 6213-0865 PT Time Calculation (min) (ACUTE ONLY): 25 min  Charges:    $Therapeutic Activity: 23-37 mins PT General Charges $$ ACUTE PT VISIT: 1 Visit                     Van Clines, PT  Acute Rehabilitation Services Office [phone] Secure Chat welcomed    Levi Aland 05/25/2023, 10:55 AM

## 2023-05-25 NOTE — Progress Notes (Signed)
Patient discharged home. DC instructions provided; AVS provided.

## 2023-05-25 NOTE — Discharge Summary (Signed)
Physician Discharge Summary  Lyden Deasy Traxler HYQ:[id number] DOB: Raymond [name] 23, 1967 DOA: 05/20/2023  PCP: Venetia Constable, MD  Admit date: 05/20/2023 Discharge date: 05/25/2023  Time spent: 27 minutes  Recommendations for Outpatient Follow-up:  Needs Chem-12 CBC in about 3 to 7 days Requires probable referral to GI for colonoscopy in 3 to 6 months given diverticulitis this hospital stay Lasix losartan from PTA held-would resume after labs are resumed in the outpatient setting Adjust pain meds as as needed  Discharge Diagnoses:  MAIN problem for hospitalization   Acute diverticulitis on admission  Please see below for itemized issues addressed in HOpsital- refer to other progress notes for clarity if needed  Discharge Condition: Improved  Diet recommendation: Soft diet heart healthy  Filed Weights   05/22/23 0500 05/23/23 0454 05/24/23 0459  Weight: (!) 149.1 kg (!) 149 kg (!) 148 kg    History of present illness:  57 year old male-currently wheelchair-bound after surgery 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 with OSA possible restrictive lung disease HTN DM TY 2 Prior displaced right-sided fibular fracture Large herniated pulposis T10-T11 status post discectomy 03/12/2023 discharged to rehab 03/23/2023-acute renal failure--Developed DVT at the hospital stay placed on Eliquis   Came to emergency room 12/18 with hematuria as well as abdominal pain associated nausea vomiting no chills no rigors   BUN/creatinine 27/2.0 >baseline of about 1.3 White count 11.7-LFTs within normal limits CT ABD pelvis acute uncomplicated sigmoid diverticulitis nondistended bladder UA showed large leukocytes negative nitrites few bacteria  Hospital Course:   Acute diverticulitis on admission--not UTI as [place] <10,000 De-escalate ceftriaxone Flagyl --->Augmentin 12/22-to complete outpatient course of antibiotics and will need outpatient colonoscopy Pain controlled on Norco 1-2 every 4 as per home regimen and will  continue this going forward no adjustments were made-he was transiently on Dilaudid and this was stopped   Rash to face-probably from the CPAP machine mask--- discontinued Atarax which was for rash and at home he can use his regular mask    AKI on admission secondary to obstructive component- Meds adjusted this hospital, PTA losartan 50 held, Lasix 80 twice daily held Continues on Coreg 25 twice daily-Polzin need to resume above meds once more stable renal function   Previous herniated nucleus pulposus with discectomy 03/12/2023 and wheelchair-bound Has equipment at home --Continue baclofen 10 3 times daily needed back spasm   Recent DVT 03/2023 Continues Eliquis through at least 06/2023   Class II obesity DM TY 2 Continue Mounjaro 10 once weekly at home CBGs ranging 90s to 120s   HTN Meds as above on hold Continues on Coreg 25   BPH Continue Cardura 2, Flomax 0.4 at bedtime   RLS continue pramipexole 0.5 3 times daily   OSA with restrictive lung disease additionally CPAP at night Encouraged weight loss as an outpatient   Depression Continue Cymbalta 30, Xanax 0.5 twice daily anxiety   Discharge Exam: Vitals:   05/24/23 2012 05/25/23 0504  BP: (!) 159/81 131/70  Pulse: 70 (!) 50  Resp: 20 20  Temp: (!) 97.5 F (36.4 C) (!) 97.5 F (36.4 C)  SpO2: 97% 97%    Subj on day of d/c   Awake coherent no distress looks well feels fair no abdominal pain eating drinking   General Exam on discharge  EOMI NCAT no focal deficit no icterus no pallor no wheeze no rales no rhonchi ROM intact CTAB no added sound Abdomen soft obese nontender no rebound No lower extremity edema Heel protectors in place  Discharge Instructions  Discharge Instructions     Diet - low sodium heart healthy   Complete by: As directed    Discharge instructions   Complete by: As directed    Complete all the antibiotics that you have been prescribed-you will need a full course of therapy to clear  up the infection in your gut and you probably will require colonoscopy in the outpatient setting   because of changes to your   kidney function likely from the diarrhea and some dehydrationwe have made some changes to your medications and have held off on both  losartan as well as your Lasix for the time being and hold them until you have recheck labs in about a week to 10 days time-you can keep a check on your blood pressures and just follow-up with your regular physician-you can take Coreg however which should   Increase activity slowly   Complete by: As directed       Allergies as of 05/25/2023       Reactions   Sulfa Antibiotics Hives        Medication List     STOP taking these medications    camphor-menthol lotion Commonly known as: SARNA   docusate sodium 100 MG capsule Commonly known as: COLACE   furosemide 20 MG tablet Commonly known as: LASIX   losartan 50 MG tablet Commonly known as: Cozaar       TAKE these medications    acetaminophen 325 MG tablet Commonly known as: TYLENOL Take 1-2 tablets (325-650 mg total) by mouth every 4 (four) hours as needed for mild pain (pain score 1-3).   albuterol 108 (90 Base) MCG/ACT inhaler Commonly known as: VENTOLIN HFA Inhale 2 puffs into the lungs every 6 (six) hours as needed for wheezing or shortness of breath.   allopurinol 300 MG tablet Commonly known as: ZYLOPRIM Take 1 tablet (300 mg total) by mouth daily.   ALPRAZolam 0.5 MG tablet Commonly known as: XANAX Take 1 tablet (0.5 mg total) by mouth 2 (two) times daily as needed.   amoxicillin-clavulanate 875-125 MG tablet Commonly known as: AUGMENTIN Take 1 tablet by mouth 2 (two) times daily for 4 days.   apixaban 5 MG Tabs tablet Commonly known as: ELIQUIS Take 1 tablet (5 mg total) by mouth 2 (two) times daily.   atorvastatin 40 MG tablet Commonly known as: LIPITOR Take 1 tablet (40 mg total) by mouth every evening.   baclofen 10 MG tablet Commonly  known as: LIORESAL Take 1 tablet (10 mg total) by mouth 3 (three) times daily.   carvedilol 25 MG tablet Commonly known as: COREG Take 1 tablet (25 mg total) by mouth in the morning and at bedtime.   diclofenac Sodium 1 % Gel Commonly known as: VOLTAREN Apply 4 g topically 4 (four) times daily. To bilateral knees   doxazosin 2 MG tablet Commonly known as: CARDURA Take 1 tablet (2 mg total) by mouth at bedtime.   DULoxetine 30 MG capsule Commonly known as: CYMBALTA Take 1 capsule (30 mg total) by mouth daily.   lidocaine 5 % Commonly known as: LIDODERM Place 2 patches onto the skin daily at 10 pm. Remove & Discard patch within 12 hours or as directed by MD   melatonin 5 MG Tabs Take 1 tablet (5 mg total) by mouth at bedtime as needed.   Mounjaro 10 MG/0.5ML Pen Generic drug: tirzepatide Inject 10 mg into the skin once a week.   ondansetron 4 MG tablet Commonly known as: Zofran  Take 1 tablet (4 mg total) by mouth every 8 (eight) hours as needed for nausea or vomiting.   oxyCODONE 5 MG immediate release tablet Commonly known as: Oxy IR/ROXICODONE Take 1-2 tablets (5-10 mg total) by mouth every 4 (four) hours as needed for severe pain (pain score 7-10).   pantoprazole 40 MG tablet Commonly known as: PROTONIX Take 2 tablets (80 mg total) by mouth daily.   polyethylene glycol powder 17 GM/SCOOP powder Commonly known as: GLYCOLAX/MIRALAX Take 17 g by mouth daily. What changed:  when to take this reasons to take this   pramipexole 0.5 MG tablet Commonly known as: MIRAPEX Take 1 tablet (0.5 mg total) by mouth in the morning and at bedtime.   tamsulosin 0.4 MG Caps capsule Commonly known as: FLOMAX Take 1 capsule (0.4 mg total) by mouth at bedtime.   tiZANidine 2 MG tablet Commonly known as: ZANAFLEX Take 1 tablet (2 mg total) by mouth at bedtime. What changed:  when to take this reasons to take this   topiramate 25 MG tablet Commonly known as: Topamax Take 1  tablet (25 mg total) by mouth 2 (two) times daily.       Allergies  Allergen Reactions   Sulfa Antibiotics Hives      The results of significant diagnostics from this hospitalization (including imaging, microbiology, ancillary and laboratory) are listed below for reference.    Significant Diagnostic Studies: CT ABDOMEN PELVIS WO CONTRAST Result Date: 05/21/2023 CLINICAL DATA:  Acute nonlocalized abdominal pain. Acute renal failure and hematuria EXAM: CT ABDOMEN AND PELVIS WITHOUT CONTRAST TECHNIQUE: Multidetector CT imaging of the abdomen and pelvis was performed following the standard protocol without IV contrast. RADIATION DOSE REDUCTION: This exam was performed according to the departmental dose-optimization program which includes automated exposure control, adjustment of the mA and/or kV according to patient size and/or use of iterative reconstruction technique. COMPARISON:  03/05/2023 FINDINGS: Lower chest: No acute abnormality. Hepatobiliary: Unremarkable liver. Normal gallbladder. No biliary dilation. Pancreas: Unremarkable. Spleen: Unremarkable. Adrenals/Urinary Tract: Normal adrenal glands. No urinary calculi or hydronephrosis. Bladder is nondistended and thick walled. Stomach/Bowel: Normal caliber large and small bowel. Diverticulosis about the descending and sigmoid colon. Wall thickening and adjacent stranding about the proximal sigmoid colon. No abscess. The appendix is normal.Stomach is within normal limits. Vascular/Lymphatic: No significant vascular findings are present. No enlarged abdominal or pelvic lymph nodes. Reproductive: Unremarkable. Other: No free intraperitoneal fluid or air. Musculoskeletal: No acute fracture. IMPRESSION: 1. Acute uncomplicated sigmoid diverticulitis. 2. Bladder is nondistended and thick walled. Correlate with urinalysis to exclude cystitis. Electronically Signed   By: Minerva Fester M.D.   On: 05/21/2023 00:48    Microbiology: Recent Results (from  the past 240 hours)  Urine Culture     Status: Abnormal   Collection Time: 05/20/23 12:31 AM   Specimen: Urine, Clean Catch  Result Value Ref Range Status   Specimen Description   Final    URINE, CLEAN CATCH Performed at [facility], [address] [place], [place] [address]    Special Requests   Final    NONE Performed at [facility], [address] [place], [place] [address]    Culture (A)  Final    <10,000 COLONIES/mL INSIGNIFICANT GROWTH Performed at [facility], [address], [place], [place] [address]    Report Status 05/22/2023 FINAL  Final     Labs: Basic Metabolic Panel: Recent Labs  Lab 05/21/23 1628 05/22/23 0610 05/23/23 0304  05/24/23 0448 05/25/23 0427  NA 139 142 136 139 137  K 3.3* 3.5 3.1* 3.3* 4.3  CL 107 108 105 106 105  CO2 26 24 24 26 26   GLUCOSE 120* 122* 103* 101* 114*  BUN 19 17 15 17 18   CREATININE 1.75* 1.85* 1.59* 1.85* 1.87*  CALCIUM 9.2 9.2 8.8* 9.1 9.2   Liver Function Tests: Recent Labs  Lab 05/21/23 1628 05/22/23 0610 05/23/23 0304 05/24/23 0448 05/25/23 0427  AST 14* 13* 15 16 19   ALT 14 14 14 16 18   ALKPHOS 83 78 74 69 78  BILITOT 0.3 0.4 0.4 0.4 0.4  PROT 6.7 6.4* 6.1* 6.0* 5.9*  ALBUMIN 2.7* 2.7* 2.5* 2.4* 2.4*   No results for input(s): "LIPASE", "AMYLASE" in the last 168 hours. No results for input(s): "AMMONIA" in the last 168 hours. CBC: Recent Labs  Lab 05/20/23 2117 05/21/23 1628 05/22/23 0610 05/23/23 0304 05/24/23 0448 05/25/23 0427  WBC 11.7* 10.8* 9.7 9.4 9.2 9.1  NEUTROABS 7.8*  --   --   --   --   --   HGB 11.1* 10.0* 10.2* 9.9* 9.7* 9.7*  HCT 34.2* 31.4* 31.5* 31.6* 30.7* 31.1*  MCV 90.5 90.8 89.5 91.9 91.1 92.0  PLT 375 368 354 342 324 313   Cardiac Enzymes: No results for input(s): "CKTOTAL", "CKMB", "CKMBINDEX", "TROPONINI" in the last 168 hours. BNP: BNP (last 3 results) Recent Labs    12/24/22 1647  BNP 179.0*    ProBNP (last 3 results) No results  for input(s): "PROBNP" in the last 8760 hours.  CBG: Recent Labs  Lab 05/24/23 0734 05/24/23 1130 05/24/23 1629 05/24/23 2015 05/25/23 0724  GLUCAP 107* 110* 128* 125* 99    Signed:  Rhetta Mura MD   [facility] 05/25/2023, 10:07 AM

## 2023-05-26 ENCOUNTER — Encounter: Payer: Self-pay | Admitting: Bariatrics

## 2023-05-26 ENCOUNTER — Ambulatory Visit (INDEPENDENT_AMBULATORY_CARE_PROVIDER_SITE_OTHER): Payer: Managed Care, Other (non HMO) | Admitting: Bariatrics

## 2023-05-26 VITALS — BP 157/87 | HR 69 | Temp 97.9°F | Ht 71.0 in | Wt 326.0 lb

## 2023-05-26 DIAGNOSIS — E1122 Type 2 diabetes mellitus with diabetic chronic kidney disease: Secondary | ICD-10-CM

## 2023-05-26 DIAGNOSIS — E559 Vitamin D deficiency, unspecified: Secondary | ICD-10-CM

## 2023-05-26 DIAGNOSIS — Z6841 Body Mass Index (BMI) 40.0 and over, adult: Secondary | ICD-10-CM | POA: Diagnosis not present

## 2023-05-26 DIAGNOSIS — E119 Type 2 diabetes mellitus without complications: Secondary | ICD-10-CM | POA: Diagnosis not present

## 2023-05-26 DIAGNOSIS — Z7985 Long-term (current) use of injectable non-insulin antidiabetic drugs: Secondary | ICD-10-CM

## 2023-05-26 MED ORDER — VITAMIN D (ERGOCALCIFEROL) 1.25 MG (50000 UNIT) PO CAPS: 50000.0000 [IU] | ORAL_CAPSULE | ORAL | 0 refills | Status: DC

## 2023-05-26 NOTE — Progress Notes (Unsigned)
First follow-up after initial visit.        WEIGHT SUMMARY AND BIOMETRICS  Weight Lost Since Last Visit: 0  Weight Gained Since Last Visit: 0  His wife called the manufacturer for his wheelchair which is a catalyst and the weight was approximated to be 25 to 30 pounds.  We have been unable to weigh him accurately due to not knowing the weight of his wheelchair and he is unable to get up out of the wheelchair.   Per the variation in his weights and his hospital weight we are going to say that his wheelchair weight is 28 pounds so that we can have a frame of reference.  Vitals Temp: 97.9 F (36.6 C) BP: (!) 157/87 Pulse Rate: 69 SpO2: 95 %   Anthropometric Measurements Height: 5\' 11"  (1.803 m) Weight: (!) 354 lb 15.1 oz (161 kg) BMI (Calculated): 49.53 Weight at Last Visit: 354lb Weight Lost Since Last Visit: 0 Weight Gained Since Last Visit: 0 Starting Weight: 354lb Total Weight Loss (lbs): 0 lb (0 kg)   No data recorded Other Clinical Data Fasting: no Labs: no Today's Visit #: 2 Starting Date: 05/13/23    OBESITY Raymond [name] is here to discuss his progress with his obesity treatment plan along with follow-up of his obesity related diagnoses.    Nutrition Plan: the Category 4 plan - 50% adherence.  Current exercise: none  Interim History:  He was hospitalized in the interim between his first visit and his first follow-up visit.   Eating all of the food on the plan., Is not skipping meals, Denies polyphagia, and Denies excessive cravings.  Initial positives regarding the dietary plan: following the plan  Initial challenges regarding  the dietary plan: hospitalized and released yesterday. He was unable to follow the plan closely.   Pharmacotherapy: Raymond [name] is on Raymond [name] 10 mg SQ weekly ( will restart the Raymond [name] today, as he did not receive in the  hospital. He is only 24 to 48 hours overdue and will not need a dose adjustment.  He was hospitalized with the primary diagnosis being acute diverticulitis and he is finishing up his antibiotic.  His kidney function was decreased to a GFR of 41 per the hospital labs Adverse side effects: Constipation Hunger is moderately controlled.  Cravings are moderately controlled.  Assessment/Plan:   Vitamin D Deficiency Vitamin D is not at goal of 50.  Most recent vitamin D level was 18.8. He is on  prescription ergocalciferol 50,000 IU weekly. Lab Results  Component Value Date   VD25OH 18.8 (L) 05/13/2023   VD25OH 13.90 (L) 12/29/2022    Plan: Continue prescription vitamin D 50,000 IU weekly.   Type II Diabetes HgbA1c is at goal. Last A1c was 6.0 Episodes of hypoglycemia: no Medication(s): Raymond [name] 10 mg SQ weekly  Lab Results  Component Value Date   HGBA1C 6.0 (H) 03/12/2023   HGBA1C 6.3 (H) 12/25/2022   Lab Results  Component Value Date   LDLCALC 77 05/13/2023   CREATININE 1.87 (H) 05/25/2023   Lab Results  Component Value Date   GFR 92.19 10/05/2014    Plan: Continue Raymond [name] 10 mg SQ weekly Continue all other medications.  Will keep all carbohydrates low both sweets and starches.  Will continue exercise regimen to 30 to 60 minutes on most days of the week.  Aim for 7 to 9 hours of sleep nightly.  Eat more low glycemic index foods.  Reviewed his diet plan with he and his wife in detail, for  sizing protein low-salt high-fiber foods.   Morbid Obesity: Current BMI BMI (Calculated): 49.53   Pharmacotherapy Plan Continue  Raymond [name] 10 mg SQ weekly  Raymond [name] is currently in the action stage of change. As such, his goal is to continue with weight loss efforts.  He has agreed to the Category 4 plan.  Exercise goals: All adults should avoid inactivity. Some physical activity is better than none, and adults who participate in any amount of physical activity gain some health  benefits.  Behavioral modification strategies: increasing lean protein intake, decreasing simple carbohydrates , no meal skipping, meal planning , better snacking choices, planning for success, increasing fiber rich foods, decreasing sodium intake, avoiding temptations, weigh protein portions, measure portion sizes, and mindful eating. He will eliminate salt and will keep his water intake to 64 ounces daily.   Raymond [name] has agreed to follow-up with our clinic in 2 weeks.    Labs reviewed today from last visit (CMP, Lipids, HgbA1c, insulin, vitamin D, B 12, and thyroid panel).   Objective:   VITALS: Per patient if applicable, see vitals. GENERAL: Alert and in no acute distress. CARDIOPULMONARY: No increased WOB. Speaking in clear sentences.  PSYCH: Pleasant and cooperative. Speech normal rate and rhythm. Affect is appropriate. Insight and judgement are appropriate. Attention is focused, linear, and appropriate.  NEURO: Oriented as arrived to appointment on time with no prompting.   Attestation Statements:   This was prepared with the assistance of Engineer, civil (consulting).  Occasional wrong-word or sound-a-like substitutions M[place] have occurred due to the inherent limitations of voice recognition software.

## 2023-06-01 ENCOUNTER — Encounter: Payer: Self-pay | Admitting: Occupational Therapy

## 2023-06-01 ENCOUNTER — Encounter: Payer: Self-pay | Admitting: Physical Therapy

## 2023-06-01 ENCOUNTER — Ambulatory Visit: Payer: Managed Care, Other (non HMO) | Admitting: Physical Therapy

## 2023-06-01 ENCOUNTER — Ambulatory Visit: Payer: Managed Care, Other (non HMO) | Admitting: Occupational Therapy

## 2023-06-01 DIAGNOSIS — M25612 Stiffness of left shoulder, not elsewhere classified: Secondary | ICD-10-CM | POA: Diagnosis present

## 2023-06-01 DIAGNOSIS — M79621 Pain in right upper arm: Secondary | ICD-10-CM | POA: Diagnosis present

## 2023-06-01 DIAGNOSIS — M25661 Stiffness of right knee, not elsewhere classified: Secondary | ICD-10-CM | POA: Diagnosis present

## 2023-06-01 DIAGNOSIS — M79622 Pain in left upper arm: Secondary | ICD-10-CM | POA: Diagnosis present

## 2023-06-01 DIAGNOSIS — M25662 Stiffness of left knee, not elsewhere classified: Secondary | ICD-10-CM | POA: Diagnosis present

## 2023-06-01 DIAGNOSIS — M6281 Muscle weakness (generalized): Secondary | ICD-10-CM | POA: Diagnosis present

## 2023-06-01 DIAGNOSIS — M25561 Pain in right knee: Secondary | ICD-10-CM | POA: Diagnosis present

## 2023-06-01 DIAGNOSIS — G8929 Other chronic pain: Secondary | ICD-10-CM

## 2023-06-01 DIAGNOSIS — R29898 Other symptoms and signs involving the musculoskeletal system: Secondary | ICD-10-CM

## 2023-06-01 DIAGNOSIS — R2689 Other abnormalities of gait and mobility: Secondary | ICD-10-CM

## 2023-06-01 DIAGNOSIS — M25611 Stiffness of right shoulder, not elsewhere classified: Secondary | ICD-10-CM | POA: Diagnosis present

## 2023-06-01 DIAGNOSIS — R2681 Unsteadiness on feet: Secondary | ICD-10-CM | POA: Diagnosis present

## 2023-06-01 DIAGNOSIS — M25562 Pain in left knee: Secondary | ICD-10-CM | POA: Diagnosis present

## 2023-06-01 NOTE — Therapy (Signed)
OUTPATIENT OCCUPATIONAL THERAPY NEURO Treatment  Patient Name: Raymond [name] MRN: [id number] DOB:09-04-1965, 57 y.o., male Today's Date: 06/01/2023  PCP: Dr. Tawanna Solo PROVIDER: Dr. Carlis Abbott  END OF SESSION:  OT End of Session - 06/01/23 0939     Visit Number 7    Number of Visits 32    Date for OT Re-Evaluation 08/24/23    Authorization Type cigna    Authorization Time Period 12 weeks    OT Start Time 0932    OT Stop Time 1015    OT Time Calculation (min) 43 min    Activity Tolerance Patient tolerated treatment well    Behavior During Therapy [facility] for tasks assessed/performed                Past Medical History:  Diagnosis Date   Acute kidney injury superimposed on chronic kidney disease (HCC) 04/13/2023   Acute on chronic anemia 04/13/2023   Anemia    Anxiety    Back pain    COPD (chronic obstructive pulmonary disease) (HCC)    Depression    Diabetes mellitus without complication (HCC)    Diverticulosis    with hx of LGIB   Edema    GERD (gastroesophageal reflux disease)    GIB (gastrointestinal bleeding)    Gout    Heavy smoker    Hyperlipidemia    Hypertension    Iron deficiency anemia    Sleep apnea    uses a cpap   Swelling of lower extremity    Past Surgical History:  Procedure Laterality Date   CHONDROPLASTY Left 06/28/2014   Procedure: CHONDROPLASTY;  Surgeon: Thera Flake., MD;  Location: [facility];  Service: Orthopedics;  Laterality: Left;   COLONOSCOPY     FOOT FASCIOTOMY     age 18-rt   KNEE ARTHROSCOPY WITH LATERAL MENISECTOMY Left 06/28/2014   Procedure: KNEE ARTHROSCOPY WITH LATERAL MENISECTOMY;  Surgeon: Thera Flake., MD;  Location: [facility];  Service: Orthopedics;  Laterality: Left;   KNEE ARTHROSCOPY WITH MEDIAL MENISECTOMY Left 06/28/2014   Procedure: LEFT KNEE ARTHROSCOPY CHONDROPLASTY/WITH MEDIAL/LATERAL MENISECTOMIES;  Surgeon: Thera Flake., MD;  Location: [facility];   Service: Orthopedics;  Laterality: Left;   ORIF RADIUS & ULNA FRACTURES  2007   left   THORACIC DISCECTOMY N/A 03/12/2023   Procedure: THORACIC LAMINECTOMY AND  DISCECTOMY;  Surgeon: Coletta Memos, MD;  Location: [facility] OR;  Service: Neurosurgery;  Laterality: N/A;   Patient Active Problem List   Diagnosis Date Noted   Cystitis 05/21/2023   Acute diverticulitis 05/21/2023   Vitamin D deficiency 05/14/2023   Low HDL (under 40) 05/14/2023   GERD (gastroesophageal reflux disease) 04/13/2023   Constipation 04/13/2023   Chronic back pain 04/13/2023   DVT, bilateral lower limbs (HCC) 04/13/2023   Coping style affecting medical condition 03/27/2023   Thoracic myelopathy with LE weakness 03/23/2023   HNP (herniated nucleus pulposus with myelopathy), thoracic 03/12/2023   Hyperlipidemia 12/24/2022   Failure to thrive in adult 12/24/2022   Tobacco abuse 03/18/2018   BPH associated with nocturia 03/11/2017   History of substance abuse (HCC) 03/11/2017   Restless leg syndrome 03/11/2017   Restrictive lung disease 05/14/2016   OSA on CPAP 05/14/2016   Moderate COPD (chronic obstructive pulmonary disease) (HCC) 05/14/2016   Smoking greater than 40 pack years 05/14/2016   SOB (shortness of breath) 10/06/2014   Edema of extremities 10/06/2014   Benign essential HTN 10/06/2014  Type 2 diabetes mellitus, without long-term current use of insulin (HCC) 07/18/2011    ONSET DATE: 04/08/23- referral date  REFERRING DIAG:  Diagnosis  M47.14 (ICD-10-CM) - Other spondylosis with myelopathy, thoracic region    THERAPY DIAG:  Muscle weakness (generalized) - Plan: Ot plan of care cert/re-cert,   Stiffness of right knee, not elsewhere classified - Plan: Ot plan of care cert/re-cert,   Other symptoms and signs involving the musculoskeletal system - Plan: Ot plan of care cert/re-cert,   Unsteadiness on feet - Plan: Ot plan of care cert/re-cert,   Other abnormalities of gait and mobility - Plan: Ot  plan of care cert/re-cert,   Pain in left upper arm - Plan: Ot plan of care cert/re-cert  Pain in right upper arm - Plan: Ot plan of care cert/re-cert  Stiffness of left shoulder, not elsewhere classified - Plan: Ot plan of care cert/re-cert  Stiffness of right shoulder, not elsewhere classified - Plan: Ot plan of care cert/re-cert  Rationale for Evaluation and Treatment: Rehabilitation  SUBJECTIVE:   SUBJECTIVE STATEMENT: Pt reports  bilateral shoulder pain Pt accompanied by: self  PERTINENT HISTORY: 57 y.o. male presenting with increased low back pain after a fall. Pt was found to have thoracic myelopathy due to a disc herniation at T10/11. He is now s/p T10 laminectomy and Discectomy. Pt received therapies at [facility] and he d/c home 03/23/23 PMH of anxiety, COPD, DM, Edema, Gout, HTN, HLD, L medial menisectomy, ORIF L radius and ulna.  PRECAUTIONS: Back  WEIGHT BEARING RESTRICTIONS: No  PAIN:  Are you having pain? Yes: NPRS scale: 7/10 Pain location: shoulder, biceps tendon bilateral UE's Pain description: aching Aggravating factors: sitting still Relieving factors: meds  FALLS: Has patient fallen in last 6 months? Yes. Number of falls 1  LIVING ENVIRONMENT: Lives with: lives with their spouse Lives in: House/apartment Stairs:  has ramp Has following equipment at home: Walker - 2 wheeled and bed side commode  PLOF: Independent with basic ADLs  PATIENT GOALS: increase I with ADLS/ADLS  OBJECTIVE:  Note: Objective measures were completed at Evaluation unless otherwise noted.  HAND DOMINANCE: Right  ADLs: Overall ADLs: increased time required, uses w/c, pt is not able to ambulate or stand for ADLs at this time Transfers/ambulation related to ADLs: Eating: mod I Grooming: mod I UB Dressing: setup LB Dressing: mod A,  Toileting: uses bedside commode supervision-min A for scooting Bathing: sponge bathing, min A per pt report Tub Shower transfers: n/a  Equipment:  bed side commode Unable to get into bathroom with w/c  IADLs: dependent with IADLS   MOBILITY STATUS:  supervision to min A for transfers  -scooting from w/c, pt is unable to stand for ADLs  Activitiy tolerance: 45 mins to hour with rest break    UPPER EXTREMITY ROM:    Active ROM Right eval Left eval  Shoulder flexion 90 110  Shoulder abduction 90 100  Shoulder adduction    Shoulder extension    Shoulder internal rotation    Shoulder external rotation    Elbow flexion [facility] WFL  Elbow extension [facility] WFL  Wrist flexion    Wrist extension    Wrist ulnar deviation    Wrist radial deviation    Wrist pronation    Wrist supination  90%  (Blank rows = not tested)  UPPER EXTREMITY MMT:     MMT Right eval Left eval  Shoulder flexion 3+/5 4/5  Shoulder abduction    Shoulder adduction    Shoulder  extension    Shoulder internal rotation    Shoulder external rotation    Middle trapezius    Lower trapezius    Elbow flexion 4/5 4+/5  Elbow extension 4//5 4+/5  Wrist flexion    Wrist extension    Wrist ulnar deviation    Wrist radial deviation    Wrist pronation    Wrist supination    (Blank rows = not tested)  HAND FUNCTION: Grip strength: Right: 72 lbs; Left: 75 lbs  COORDINATION: 9 Hole Peg test: Right: 23.21 sec; Left: 32.52 sec  SENSATION: WFL      COGNITION: Overall cognitive status: Within functional limits for tasks assessed    OBSERVATIONS: Pleasant male is highly motivated to improve   TODAY'S TREATMENT:                                                                                                                              DATE: 06/01/23 Therapist checked progress towards goals as pt was recently hospitalize for diverticulitis.  Pt demonstrates bilateral shoulder flexion to 70* prior to significant pain. He reports increased bilateral shoulder pain and he has been cautioned to limit repetative use of bilateral UE's and to avoid biceps  curls. [place] 1 mhz, 1.0 w/cm 2, 20%  x 8 mins to right shoulder and biceps, no adverse reactions. Ice pack applied to R shoulder x 7 mins while pt recived UE to left shoulder. No adverse reactions. Pt reports he is nearly pain free afterwards. [place] to left shoulder, , 1.0 w/cm 2, 20% x 7 mins for pain relief no adverse reactions. Therapist discussed use of Iontophoreis with pt and he is agreeable. Therapist to request order.  05/20/23- US:1- mhz,  1.0 w/cm 2, 50%, x 10 mins  for L biceps tendon and biceps and left lateral/ anterior shoulder and left shoulder pain, no adverse reactions  Ice pack applied to left shoulder x 8 min  Pt reports pain was improved to 2-3 /10 at end of session  05/14/23-UBE x 6 mins level 3 for conditioning Pt reports difficulty with bathing his bottom and hygine. Therapist discussed with pt/ wife and provided information regarding toilet aide, handheld bidet and long handled sponge. therapist recommends pt does as much as he can then has assistnace from his wife for thoroughness. Therapist also discussed with pt/ wife activies pt can perform at home at w/c level: folding clothes, wiping kitchen counter, and retieveing a snack for the fridge. Closed chain shoulder flexion with cane, to 90*, 20 reps min v.c Biceps curls and triceps extension 15 reps each, min v.c  05/11/23-UBE x 6 mins level 2 for conditioning Biceps curls 2 sets of 10-15 reps with 2 lbs hand weights, triceps ext with red band, min v.c 15 reps each UE Closed chain shoulder flexion with cane 15 reps to shoulder height, min v.c to avoid shoulder hike, then 15 reps with 2 lbs cane  Pt practiced scooting along edge  of mat with emphasis on lifting up with arms and legs before scoot, min v.c Pt simulated donning pants with reacher and min v.c and demonstration. Pt transferred back to w/c via sliding board with supervision min-mod v.c  05/07/23 UBE x 6 mins level 3 for conditioning Pt was instructed in updates to  HEP, for bilateral UE's min v.c and demonstration 10 reps each, red band and cane were used see pt instuctions.  05/06/23- UBE x 6 mins level 2 for conditioning, seated in w/c Pt transferred scoot pivot without sliding board to mat with supervision Seated on mat closed chain shoulder flexion and biceps curls 10 reps each, min v.c  Biceps curls x 20 reps with 3lbs weight bilateral UE's, min v.c shoulder flexion bilateal UE's with 2 lbs weight in each hand, 15 reps. Pt practiced scooting on mat with cueing to shift weight fowrard and pushdown through hands to scoot, pt also practiced using this technique for a sliding board transfer back to w/c. min v.c Pt's wife rports pt an't go into the kitchen because he can't remove his leg rests. Pt practiced removing and reattaching w/c leg rests with min v.c the pt practiced walking his w/c forwards with legs while assisting aby pushing wheels. see education 04/22/23 eval  PATIENT EDUCATION: Education details:  education regarding resting his UE's  placing his UE exercises on holding, minimizing repetative use of UE to quiet shoulder pain, iPerson educated: Patient Education method: Explanation and Verbal cues Education comprehension: verbalized understanding,  HOME EXERCISE PROGRAM:  red band and vertical cane 05/07/23   GOALS: Goals reviewed with patient? Yes  SHORT TERM GOALS: Target date: 07/01/22  I with inital HEP.  Goal status: ongoing, 06/01/23  2.  Pt will peform LB dressing with min A  Goal status: ongoing, mod A 06/01/23  3.  Pt will consistently perform toilet transfers with supervision.  Goal status: ongoing, supervision with transfer, min A hygeine  4.  Pt will perform snack or beverage prep at a w/c level  Goal status: ongoing, dependent 06/01/23  5. Pt will verbalize understanding of back precautions.  Goal status: ongoing, 06/01/23, reveiwed BAT, however pt needs reinforcement    LONG TERM GOALS: Target date:  08/24/22  I with updated HEP. Baseline:  Goal status:  ongoing  2.  Pt will perform bathing with supervision/ set up  Goal status:  ongoing  3.  Pt will perform LB dressing with supervision/ set up.  Goal status: I ongoing  4.  Pt will demonstrate ability with stand for 5 mins with min A for ADLs/ IADLs.  Goal status:  ongoing  5.  Pt will perfrom simple home management at a walker level with min A.  Goal status:  ongoing   ASSESSMENT: 57 y.o. male was hospitalized at [facility] on 05/20/23 with abdominal pain and hematuria, being worked up for acute diverticulitis. PMH of anxiety, COPD, DM, Edema, Gout, HTN, HLD, L medial menisectomy, ORIF L radius and ulna, T10 laminectomy.  Pt was receiving occupational therapy prior to his hospitalization. He can benefit from skilled occupational therapy to address the following: UE pain, UE strength, functional mobility, standing balance and tolerance in order to maximize pt's safety and I with ADLs/ IADLs. CLINICAL IMPRESSION: Pt is progressing towards. He demonstrates understanding of intial HEP. PERFORMANCE DEFICITS: in functional skills including ADLs, IADLs, ROM, strength, pain, flexibility, mobility, balance, body mechanics, endurance, decreased knowledge of precautions, decreased knowledge of use of DME, and UE functional use,  and  psychosocial skills including coping strategies, environmental adaptation, habits, interpersonal interactions, and routines and behaviors.   IMPAIRMENTS: are limiting patient from ADLs, IADLs, rest and sleep, work, play, leisure, and social participation.   CO-MORBIDITIES: Ma have co-morbidities  that affects occupational performance. Patient will benefit from skilled OT to address above impairments and improve overall function.  MODIFICATION OR ASSISTANCE TO COMPLETE EVALUATION: No modification of tasks or assist necessary to complete an evaluation.  OT OCCUPATIONAL PROFILE AND HISTORY: Detailed assessment: Review  of records and additional review of physical, cognitive, psychosocial history related to current functional performance.  CLINICAL DECISION MAKING: LOW - limited treatment options, no task modification necessary  REHAB POTENTIAL: Good  EVALUATION COMPLEXITY: Low    PLAN:  OT FREQUENCY: 2x/week  OT DURATION: 12 weeks plus eval  PLANNED INTERVENTIONS: 97168 OT Re-evaluation, 97535 self care/ADL training, 51761 therapeutic exercise, 97530 therapeutic activity, 97112 neuromuscular re-education, 97140 manual therapy, 97116 gait training, 60737 aquatic therapy, 97035 ultrasound, 97018 paraffin, 10626 moist heat, 97010 cryotherapy, passive range of motion, balance training, stair training, functional mobility training, psychosocial skills training, energy conservation, coping strategies training, patient/family education, and DME and/or AE instructions  RECOMMENDED OTHER SERVICES: PT  CONSULTED AND AGREED WITH PLAN OF CARE: Patient  PLAN FOR NEXT SESSION:    monitor pain, [place], iontophoresis if order is back from MD, ADL performance/ activity modification to minimize shoulder pain   Braxtyn Dorff, OT 06/01/2023, 10:52 AM

## 2023-06-01 NOTE — Therapy (Signed)
OUTPATIENT PHYSICAL THERAPY CERVICAL/THORACIC TREATMENT   Patient Name: Raymond [name] MRN: [id number] DOB:02-Sep-1965, 57 y.o., male Today's Date: 06/01/2023  END OF SESSION:  PT End of Session - 06/01/23 1058     Visit Number 8    Date for PT Re-Evaluation 07/27/23    PT Start Time 1015    PT Stop Time 1055    PT Time Calculation (min) 40 min    Equipment Utilized During Treatment Gait belt    Activity Tolerance Patient tolerated treatment well    Behavior During Therapy WFL for tasks assessed/performed            Past Medical History:  Diagnosis Date   Acute kidney injury superimposed on chronic kidney disease (HCC) 04/13/2023   Acute on chronic anemia 04/13/2023   Anemia    Anxiety    Back pain    COPD (chronic obstructive pulmonary disease) (HCC)    Depression    Diabetes mellitus without complication (HCC)    Diverticulosis    with hx of LGIB   Edema    GERD (gastroesophageal reflux disease)    GIB (gastrointestinal bleeding)    Gout    Heavy smoker    Hyperlipidemia    Hypertension    Iron deficiency anemia    Sleep apnea    uses a cpap   Swelling of lower extremity    Past Surgical History:  Procedure Laterality Date   CHONDROPLASTY Left 06/28/2014   Procedure: CHONDROPLASTY;  Surgeon: Thera Flake., MD;  Location: [facility];  Service: Orthopedics;  Laterality: Left;   COLONOSCOPY     FOOT FASCIOTOMY     age 59-rt   KNEE ARTHROSCOPY WITH LATERAL MENISECTOMY Left 06/28/2014   Procedure: KNEE ARTHROSCOPY WITH LATERAL MENISECTOMY;  Surgeon: Thera Flake., MD;  Location: [facility];  Service: Orthopedics;  Laterality: Left;   KNEE ARTHROSCOPY WITH MEDIAL MENISECTOMY Left 06/28/2014   Procedure: LEFT KNEE ARTHROSCOPY CHONDROPLASTY/WITH MEDIAL/LATERAL MENISECTOMIES;  Surgeon: Thera Flake., MD;  Location:  SURGERY CENTER;  Service: Orthopedics;  Laterality: Left;   ORIF RADIUS & ULNA FRACTURES  2007   left    THORACIC DISCECTOMY N/A 03/12/2023   Procedure: THORACIC LAMINECTOMY AND  DISCECTOMY;  Surgeon: Coletta Memos, MD;  Location: [facility];  Service: Neurosurgery;  Laterality: N/A;   Patient Active Problem List   Diagnosis Date Noted   Cystitis 05/21/2023   Acute diverticulitis 05/21/2023   Vitamin D deficiency 05/14/2023   Low HDL (under 40) 05/14/2023   GERD (gastroesophageal reflux disease) 04/13/2023   Constipation 04/13/2023   Chronic back pain 04/13/2023   DVT, bilateral lower limbs (HCC) 04/13/2023   Coping style affecting medical condition 03/27/2023   Thoracic myelopathy with LE weakness 03/23/2023   HNP (herniated nucleus pulposus with myelopathy), thoracic 03/12/2023   Hyperlipidemia 12/24/2022   Failure to thrive in adult 12/24/2022   Tobacco abuse 03/18/2018   BPH associated with nocturia 03/11/2017   History of substance abuse (HCC) 03/11/2017   Restless leg syndrome 03/11/2017   Restrictive lung disease 05/14/2016   OSA on CPAP 05/14/2016   Moderate COPD (chronic obstructive pulmonary disease) (HCC) 05/14/2016   Smoking greater than 40 pack years 05/14/2016   SOB (shortness of breath) 10/06/2014   Edema of extremities 10/06/2014   Benign essential HTN 10/06/2014   Type 2 diabetes mellitus, without long-term current use of insulin (HCC) 07/18/2011    PCP: Venetia Constable, MD  REFERRING PROVIDER:  Angiulli, Mcarthur Rossetti, PA-C   REFERRING DIAG: [phone] (ICD-10-CM) - Other spondylosis with myelopathy, thoracic region   THERAPY DIAG:  Muscle weakness (generalized)  Stiffness of right knee, not elsewhere classified  Stiffness of left shoulder, not elsewhere classified  Stiffness of right shoulder, not elsewhere classified  Unsteadiness on feet  Other abnormalities of gait and mobility  Stiffness of left knee, not elsewhere classified  Chronic pain of left knee  Chronic pain of right knee  Rationale for Evaluation and Treatment: Rehabilitation  ONSET  DATE: October 2024  SUBJECTIVE:                                                                                                                                                                                                         SUBJECTIVE STATEMENT: Pt was hospitalized x 5 days due to diverticulitis. His B shoulders hurting today. OT aware and addressing.  From eval: Pt states he fell, broke his ankle and was found to later have a ruptured disc and had to get back surgery. Went to inpatient rehab x 3 weeks. Felt like he built up his upper body. Has now been home x 1 week. States he has not been doing much since then -- has done some sitting exercises. Was able to stand for 3 min bouts for a total of about 12 min using RW at inpatient rehab. Has history of bad knees and will be getting gel shots. 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 He was found to have large HNP T10/T11 and underwent T10 laminectomy with discectomy by Dr. Franky Macho on 03/12/2023   PAIN:  Are you having pain? No  PRECAUTIONS: None  RED FLAGS: None     WEIGHT BEARING RESTRICTIONS: No  FALLS:  Has patient fallen in last 6 months? Yes. Number of falls 1 -- last fall Oct 2024  LIVING ENVIRONMENT: Lives with: lives with their spouse Lives in: House/apartment Stairs:  ramp Has following equipment at home: Environmental consultant - 2 wheeled, Wheelchair (manual), and upright walker, hospital bed  OCCUPATION: Retired  PLOF:  Able to do his own ADLs but easier to have wife assist him  PATIENT GOALS: Walk with the walker at least household distances  NEXT MD VISIT: 04/21/23 [place]  OBJECTIVE:  Note: Objective measures were completed at Evaluation  unless otherwise noted.  DIAGNOSTIC FINDINGS:  03/12/23 MRI THORACIC SPINE IMPRESSION:   1. Moderate-sized lobulated left subarticular to foraminal disc protrusion at T10-11 with resultant severe spinal stenosis. Cord is compressed and deviated to the right at this level. Associated cord signal changes concerning for edema and/or myelomalacia. 2. No other acute abnormality within the thoracic spine. 3. Degenerative spondylosis at T8-9 through T11-12 without significant spinal stenosis. Associated moderate to severe bilateral foraminal narrowing at these levels as above.  MRI LUMBAR SPINE IMPRESSION:   1. No acute abnormality within the lumbar spine. 2. Multifactorial degenerative changes at L4-5 with resultant mild canal with severe left and moderate right lateral recess stenosis, with severe bilateral L4 foraminal narrowing. 3. Additional mild noncompressive disc bulging and facet hypertrophy elsewhere within the lumbar spine as above. No other significant stenosis or frank neural impingement.   Critical Value/emergent results were called by telephone at the time of interpretation on 03/12/2023 at 12:41 am to provider Dr. Madilyn Hook, who verbally acknowledged these results.  PATIENT SURVEYS:  FOTO 42; predicted 67  COGNITION: Overall cognitive status: Within functional limits for tasks assessed  SENSATION: WFL  POSTURE: rounded shoulders and forward head  PALPATION: No overt tenderness to palpation   CERVICAL ROM: WNL  LOWER EXTREMITY MMT:    MMT Right eval Left eval  Hip flexion 3+ 3  Hip extension 3+ 3+  Hip abduction 3+ 3+  Hip adduction    Hip internal rotation    Hip external rotation    Knee flexion 4- 3+  Knee extension 4- 3+  Ankle dorsiflexion    Ankle plantarflexion    Ankle inversion    Ankle eversion     (Blank rows = not tested)   FUNCTIONAL TESTS:  Transfers:  Chair to bed: Multiple seated scoots with UEs  Sit<>stand: max A -- limited due to  knee pain Supine to sit<>sit: min A for trunk and LE negotiation. Not performing log rolling  TODAY'S TREATMENT:                                                                                                                              DATE:  06/01/23 New W/C adjustment- armrests raised to better support his elbows at rest Sliding board transfer to mat, min A for board placement, then S, improved scooting, still required many scoots. Sit to supine with S Repeated rolling to each side lifting the leg to cross body rather than push on mat, 5 x each way. Very challenging, but complete. Sup to sit with S. Sit to stand in parallel bars. First attempt, unable to stand, second and third attempts, reliant on UE support, but able to achieve full stance. X 10 sec. Mod A for standing  05/20/23 Assessed L shoulder due to increased pain. Negative full can, belly press, resisted ER, positive empty can, Hawkin's. TTP mildly over L supraspinatus muscle and tendon, biceps tendon. Sliding board transfer to mat with S,  VC to push with legs to assist. Seated L shoulder distraction holding the edge of mat and leaning to R. Seated press ups for scapular stability. Moved sit to supine with S. His feet and ankles appear more swollen today. He attemtped to push into physioball to activate LE. However, his legs kept spasming, so this was deferred. Attempted sit to stand from elevated mat, but he was unable to raise hips today.  05/14/23 Transfer to mat, Challenging for level transfer, but I Sit to Supine Mi, improved ability to lift legs to surface Supine with legs over G physioball- bridge, DKTC, quick side to side rotations, alternating lifting one leg off ball to mat and back up, 10 reps each. Supine to long sit, then scoot hips forward to the edge of mat, very challenging, but able to accomplish with VC from therapist for correct technique, lean and scoot. Fitter press, 1 x 10 with 1 blue band, then 1 x 5  with 2 blue bands, focus Standing in parallel bars from elevated W/C seat. Pushes up from bars with min A and extra time, still largely reliant on UE support, but trying to move and relax arms to rely on his legs more. Stood 3 x 30 sec, CGA once up with close guarding at knees in case of buckling.  05/11/23 W/C > mat scoot transfer with S. Difficulty getting started, but completed without physical A. Moved into longsit on mat, then scooted over and backwards. Performed multiple movements on the mat in order to facilitate active trunk stability, including prone lying, attempted to move to quadruped, but his knees kept slipping back as he tried to push up. Rolled to R and performed L hip abd, repeated on L sidelie for R hip. Sup to sit on edge of mat with much less effort required today, S Sit<> partial stand from elevated mat with hands on chair in front of him, able to come to stand with hands on seat, then place hands on the armrests of the chair. 3 reps. Sit to stand with mod A to initiate and given extra time to rise. Parallel bars- Sit to stand from W/C with Airex pad in seat to elevate slightly. Pushed up on parallel bars. Able to achieve fully upright stance and perform lat weight shifts, but had difficulty once he tried to lift either foot to simulate taking a step, poor control of limb in space.  05/07/23 Sliding board transfer to mat using lean technique with shoulders away from hips to initiate. He then was able to lift his hips and scoot the rest of the way. Supine bridging x 10, able to clear hips Repeated roll to each side x 3 using trunk into flexion. Fitter press with 2 blue bands x 10 each leg, very challenging. Functional trunk and LE strengthening, seated on mat, turn to R, walking both hands away from trunk, then lifting L LE to touch heel to surface of mat. X 5 lifts to each side, very challenging. Sit<> partial stand from elevated mat with hands on chair in front of him, able to  come to stand with hands on seat, then place hands on the armrests of the chair. Moved to parallel bars with Air ex pad on top of his W/C cushion to elevate him. Sit > Stand pulling bars, with mod A and time to push into full upright posture where he stood x approx 30 seconds. Relies on arms to A.  05/06/23 Lateral transfers from W/C <> mat with min  A. Sit to supine with light min A to BLE Rolled to L- Therapist facilitated lower trunk active mobilization in ant/post and inf/sup planes of movement. Moved to R LE with acitve hip abd, very weak, tends to turn and use quads, clamshell also required mod A. Facilitated repeated rolling using normalized technique with RLE and UE held in the air to engage trunk. Very challenging, but able to complete x 4. Repeated all activities in R side lying. Moved from Sup > long sit I, but did report some strain in back Sit <> partial stand from elevated mat, hands on the seat or armrests of a chair facing him, required +2 to initiate due to B knee pain, but he was able to rise into bent over standing position x5 and hold for up to 5 seconds. B scoot transfers, emphasizing lifting hips off the surface by pushing through legs, x 5 each way on mat Transfer back to W/C with CGA. Mat > W/C with CGA  04/23/23 Seated heel slide 2x10 Transfer x2; min A to stabilize w/c while getting to bed. Pt utilizing multiple scoots and UEs SLR short range x10 (>10 deg extensor lag bilat) Quad set 2x10 SAQ 2# ankle weight 2x10 Supine clamshell green TB 2x10 Bridge 2x10 Supine to sit log roll with SBA for v/cs In // bars pulling up for 1/4 to standing 2x5  In w/c pushing with legs only 2x10' In w/c pulling with legs only 2x10'  04/21/23 See HEP below   PATIENT EDUCATION:  Education details: Exam findings, POC, initial HEP Person educated: Patient Education method: Explanation, Demonstration, and Handouts Education comprehension: verbalized understanding, returned  demonstration, and needs further education  HOME EXERCISE PROGRAM: Access Code: [email or web address] URL: [email or web address] Date: 04/21/2023 Prepared by: Vernon Prey April Kirstie Peri  Exercises - Staggered Bridge  - 1 x daily - 7 x weekly - 2 sets - 10 reps - Hooklying Isometric Clamshell  - 1 x daily - 7 x weekly - 2 sets - 10 reps - Small Range Straight Leg Raise  - 1 x daily - 7 x weekly - 2 sets - 10 reps  ASSESSMENT:  CLINICAL IMPRESSION: Patient was hospitalized for 5 days due to diverticulitis. His physical and functional status was re-assessed for [facility] based upon the illness. He does appear to be near his levels prior to his illness, but his knees and shoulders were more painful and did limit his standing. He will benefit from continued PT to improve his strength and his safety and I with all functional mobility. Goals were modified from initial evaluation as they were very high level. Still hoping to get him standing and ambulating a bit in order to facilitate his independence.  From eval: Patient is a 57 y.o. M who was seen today for physical therapy evaluation and treatment for thoracic myelopathy. PMH significant for bilat knee OA (pt to get gel shots and is interested in future TKA once he gets his weight down) and T10 laminectomy with discectomy by Dr. Franky Macho on 03/12/2023. Went to inpatient rehab x 3 weeks. Has now been at home x 1 week. Pt states he does not currently have any known precautions. Assessment significant for gross bilat LE weakness (L LE weaker than R) affecting standing tolerance, transfers and balance. Pt is limited due to his knee pain. Pt will highly benefit from PT to address these deficits to reach pt's goals for increased mobility and amb.   OBJECTIVE IMPAIRMENTS: Abnormal gait, decreased balance, decreased endurance, decreased mobility,  difficulty walking, decreased ROM, decreased strength, increased edema, improper body mechanics, postural dysfunction,  obesity, and pain.   GOALS: Goals reviewed with patient? Yes  SHORT TERM GOALS: Target date: 04/15/24   Pt will be ind with initial HEP Baseline:  Goal status: 05/06/23- provided, but inconsistent in performing. Ongoing  2.  Pt will be able to tolerate standing x 1 min in the parallel bars with BUE support and CGA Baseline: 06/01/23- Goal updated after recent hospitalization Goal status: 06/01/23-Was standing x 30 sec before hospitalization  3.  Pt will be able to perform sliding board transfer with S, including set up, and up a slight (3") grade Baseline: 06/01/23- Goal updated after recent hospitalization Goal status: 06/01/23- Min A for set up, S for level transfer, required mult scoots   LONG TERM GOALS: Target date: 07/14/2023   Pt will be ind with management and progression of HEP Baseline:  Goal status: INITIAL  2.  Pt will be able to amb at least 20' with RW min A for home mobility Baseline:  Goal status: 06/01/23- Stands x up to 30 sec with BUE support in parallel bars, ongoing  3.  Pt will be able to perform at least 2 reps of sit to stand in 30 sec with UE support Baseline: Unable to perform complete STS without mod A Goal status: 06/01/23- updated  4.  Pt will have increased FOTO to >/=54 Baseline:  Goal status: INITIAL  PLAN:  PT FREQUENCY: 2x/week  PT DURATION: 12 weeks  PLANNED INTERVENTIONS: 97164- PT Re-evaluation, 97110-Therapeutic exercises, 97530- Therapeutic activity, [id number]- Neuromuscular re-education, 97535- Self Care, 16109- Manual therapy, [id number]- Gait training, [phone]- Orthotic Fit/training, [phone]- Aquatic Therapy, 97014- Electrical stimulation (unattended), [id number]- Ultrasound, 91478- Ionotophoresis 4mg /ml Dexamethasone, Patient/Family education, Balance training, Stair training, Taping, Dry Needling, Joint mobilization, Spinal mobilization, Cryotherapy, and Moist heat  PLAN FOR NEXT SESSION: Assess response to HEP. Continue to work on LE  strengthening and transfers/standing.    Iona Beard, DPT 06/01/2023, 2:42 PM

## 2023-06-03 NOTE — Therapy (Addendum)
 OUTPATIENT OCCUPATIONAL THERAPY NEURO Treatment  Patient Name: Raymond [name] Dy MRN: [id number] DOB:Pennino 29, 1967, 58 y.o., male Today's Date: 06/04/2023  PCP: Dr. Marsa MART PROVIDER: Dr. Lorilee  END OF SESSION:  OT End of Session - 06/04/23 1026     Visit Number 8    Number of Visits 32    Date for OT Re-Evaluation 08/24/23    Authorization Type cigna    Authorization Time Period 12 weeks    OT Start Time 1023    OT Stop Time 1101    OT Time Calculation (min) 38 min    Activity Tolerance Patient tolerated treatment well    Behavior During Therapy WFL for tasks assessed/performed                 Past Medical History:  Diagnosis Date   Acute kidney injury superimposed on chronic kidney disease (HCC) 04/13/2023   Acute on chronic anemia 04/13/2023   Anemia    Anxiety    Back pain    COPD (chronic obstructive pulmonary disease) (HCC)    Depression    Diabetes mellitus without complication (HCC)    Diverticulosis    with hx of LGIB   Edema    GERD (gastroesophageal reflux disease)    GIB (gastrointestinal bleeding)    Gout    Heavy smoker    Hyperlipidemia    Hypertension    Iron deficiency anemia    Sleep apnea    uses a cpap   Swelling of lower extremity    Past Surgical History:  Procedure Laterality Date   CHONDROPLASTY Left 06/28/2014   Procedure: CHONDROPLASTY;  Surgeon: LELON JONETTA Shari Mickey., MD;  Location: [facility];  Service: Orthopedics;  Laterality: Left;   COLONOSCOPY     FOOT FASCIOTOMY     age 15-rt   KNEE ARTHROSCOPY WITH LATERAL MENISECTOMY Left 06/28/2014   Procedure: KNEE ARTHROSCOPY WITH LATERAL MENISECTOMY;  Surgeon: LELON JONETTA Shari Mickey., MD;  Location: [facility];  Service: Orthopedics;  Laterality: Left;   KNEE ARTHROSCOPY WITH MEDIAL MENISECTOMY Left 06/28/2014   Procedure: LEFT KNEE ARTHROSCOPY CHONDROPLASTY/WITH MEDIAL/LATERAL MENISECTOMIES;  Surgeon: LELON JONETTA Shari Mickey., MD;  Location: [facility];   Service: Orthopedics;  Laterality: Left;   ORIF RADIUS & ULNA FRACTURES  2007   left   THORACIC DISCECTOMY N/A 03/12/2023   Procedure: THORACIC LAMINECTOMY AND  DISCECTOMY;  Surgeon: Gillie Duncans, MD;  Location: [facility];  Service: Neurosurgery;  Laterality: N/A;   Patient Active Problem List   Diagnosis Date Noted   Cystitis 05/21/2023   Acute diverticulitis 05/21/2023   Vitamin D  deficiency 05/14/2023   Low HDL (under 40) 05/14/2023   GERD (gastroesophageal reflux disease) 04/13/2023   Constipation 04/13/2023   Chronic back pain 04/13/2023   DVT, bilateral lower limbs (HCC) 04/13/2023   Coping style affecting medical condition 03/27/2023   Thoracic myelopathy with LE weakness 03/23/2023   HNP (herniated nucleus pulposus with myelopathy), thoracic 03/12/2023   Hyperlipidemia 12/24/2022   Failure to thrive  in adult 12/24/2022   Tobacco abuse 03/18/2018   BPH associated with nocturia 03/11/2017   History of substance abuse (HCC) 03/11/2017   Restless leg syndrome 03/11/2017   Restrictive lung disease 05/14/2016   OSA on CPAP 05/14/2016   Moderate COPD (chronic obstructive pulmonary disease) (HCC) 05/14/2016   Smoking greater than 40 pack years 05/14/2016   SOB (shortness of breath) 10/06/2014   Edema of extremities 10/06/2014   Benign essential HTN 10/06/2014  Type 2 diabetes mellitus, without long-term current use of insulin  (HCC) 07/18/2011    ONSET DATE: 04/08/23- referral date  REFERRING DIAG:  Diagnosis  M47.14 (ICD-10-CM) - Other spondylosis with myelopathy, thoracic region    THERAPY DIAG:  Muscle weakness (generalized) - Plan: Ot plan of care cert/re-cert,   Stiffness of right knee, not elsewhere classified - Plan: Ot plan of care cert/re-cert,   Other symptoms and signs involving the musculoskeletal system - Plan: Ot plan of care cert/re-cert,   Unsteadiness on feet - Plan: Ot plan of care cert/re-cert,   Other abnormalities of gait and mobility - Plan: Ot  plan of care cert/re-cert,   Pain in left upper arm - Plan: Ot plan of care cert/re-cert  Pain in right upper arm - Plan: Ot plan of care cert/re-cert  Stiffness of left shoulder, not elsewhere classified - Plan: Ot plan of care cert/re-cert  Stiffness of right shoulder, not elsewhere classified - Plan: Ot plan of care cert/re-cert  Rationale for Evaluation and Treatment: Rehabilitation  SUBJECTIVE:   SUBJECTIVE STATEMENT: Pt reports  bilateral shoulder pain, but that the ultrasound has helped  Pt accompanied by: self  PERTINENT HISTORY: 58 y.o. male presenting with increased low back pain after a fall. Pt was found to have thoracic myelopathy due to a disc herniation at T10/11. He is now s/p T10 laminectomy and Discectomy. Pt received therapies at [facility] and he d/c home 03/23/23 PMH of anxiety, COPD, DM, Edema, Gout, HTN, HLD, L medial menisectomy, ORIF L radius and ulna.  PRECAUTIONS: Back  WEIGHT BEARING RESTRICTIONS: No  PAIN:  Are you having pain? Yes: NPRS scale: 5/10 Pain location: shoulder, biceps tendon bilateral UE's Pain description: aching Aggravating factors: sitting still Relieving factors: meds  FALLS: Has patient fallen in last 6 months? Yes. Number of falls 1  LIVING ENVIRONMENT: Lives with: lives with their spouse Lives in: House/apartment Stairs:  has ramp Has following equipment at home: Walker - 2 wheeled and bed side commode  PLOF: Independent with basic ADLs  PATIENT GOALS: increase I with ADLS/ADLS  OBJECTIVE:  Note: Objective measures were completed at Evaluation unless otherwise noted.  HAND DOMINANCE: Right  ADLs: Overall ADLs: increased time required, uses w/c, pt is not able to ambulate or stand for ADLs at this time Transfers/ambulation related to ADLs: Eating: mod I Grooming: mod I UB Dressing: setup LB Dressing: mod A,  Toileting: uses bedside commode supervision-min A for scooting Bathing: sponge bathing, min A per pt report Tub  Shower transfers: n/a  Equipment: bed side commode Unable to get into bathroom with w/c  IADLs: dependent with IADLS   MOBILITY STATUS:  supervision to min A for transfers  -scooting from w/c, pt is unable to stand for ADLs  Activitiy tolerance: 45 mins to hour with rest break    UPPER EXTREMITY ROM:    Active ROM Right eval Left eval  Shoulder flexion 90 110  Shoulder abduction 90 100  Shoulder adduction    Shoulder extension    Shoulder internal rotation    Shoulder external rotation    Elbow flexion [facility] WFL  Elbow extension [facility] WFL  Wrist flexion    Wrist extension    Wrist ulnar deviation    Wrist radial deviation    Wrist pronation    Wrist supination  90%  (Blank rows = not tested)  UPPER EXTREMITY MMT:     MMT Right eval Left eval  Shoulder flexion 3+/5 4/5  Shoulder abduction  Shoulder adduction    Shoulder extension    Shoulder internal rotation    Shoulder external rotation    Middle trapezius    Lower trapezius    Elbow flexion 4/5 4+/5  Elbow extension 4//5 4+/5  Wrist flexion    Wrist extension    Wrist ulnar deviation    Wrist radial deviation    Wrist pronation    Wrist supination    (Blank rows = not tested)  HAND FUNCTION: Grip strength: Right: 72 lbs; Left: 75 lbs  COORDINATION: 9 Hole Peg test: Right: 23.21 sec; Left: 32.52 sec  SENSATION: WFL      COGNITION: Overall cognitive status: Within functional limits for tasks assessed    OBSERVATIONS: Pleasant male is highly motivated to improve   TODAY'S TREATMENT:                                                                                                                               06/04/23:    Ultrasound 1 mhz, 1.0 w/cm 2, 20%  x 8 mins to right shoulder and biceps, no adverse reactions.  Ultrasound  to left shoulder, , 1.0 w/cm 2, 20% x 8 mins for pain relief no adverse reactions.   Closed-chain shoulder flex with BUEs in pain-free range with min cueing for  positioning/sholder hike  Iontophoresis Patch Set-up and Applied.  No adverse reactions observed while in clinic.  Pt/wife instructed in Ionto instructions/precautions and contraindications Type: Desamethasone Location: bilateral shoulders Dose: 48mA/min, 1mL patch Time: 4hr patch    06/01/23 Therapist checked progress towards goals as pt was recently hospitalize for diverticulitis.  Pt demonstrates bilateral shoulder flexion to 70* prior to significant pain. He reports increased bilateral shoulder pain and he has been cautioned to limit repetative use of bilateral UE's and to avoid biceps curls. US  1 mhz, 1.0 w/cm 2, 20%  x 8 mins to right shoulder and biceps, no adverse reactions. Ice pack applied to R shoulder x 7 mins while pt recived UE to left shoulder. No adverse reactions. Pt reports he is nearly pain free afterwards. US  to left shoulder, , 1.0 w/cm 2, 20% x 7 mins for pain relief no adverse reactions. Therapist discussed use of Iontophoreis with pt and he is agreeable. Therapist to request order.  05/20/23- US :1- mhz,  1.0 w/cm 2, 50%, x 10 mins  for L biceps tendon and biceps and left lateral/ anterior shoulder and left shoulder pain, no adverse reactions  Ice pack applied to left shoulder x 8 min  Pt reports pain was improved to 2-3 /10 at end of session  05/14/23-UBE x 6 mins level 3 for conditioning Pt reports difficulty with bathing his bottom and hygine. Therapist discussed with pt/ wife and provided information regarding toilet aide, handheld bidet and long handled sponge. therapist recommends pt does as much as he can then has assistnace from his wife for thoroughness. Therapist also discussed with pt/ wife activies pt can  perform at home at w/c level: folding clothes, wiping kitchen counter, and retieveing a snack for the fridge. Closed chain shoulder flexion with cane, to 90*, 20 reps min v.c Biceps curls and triceps extension 15 reps each, min v.c  05/11/23-UBE x 6  mins level 2 for conditioning Biceps curls 2 sets of 10-15 reps with 2 lbs hand weights, triceps ext with red band, min v.c 15 reps each UE Closed chain shoulder flexion with cane 15 reps to shoulder height, min v.c to avoid shoulder hike, then 15 reps with 2 lbs cane  Pt practiced scooting along edge of mat with emphasis on lifting up with arms and legs before scoot, min v.c Pt simulated donning pants with reacher and min v.c and demonstration. Pt transferred back to w/c via sliding board with supervision min-mod v.c  05/07/23 UBE x 6 mins level 3 for conditioning Pt was instructed in updates to HEP, for bilateral UE's min v.c and demonstration 10 reps each, red band and cane were used see pt instuctions.  05/06/23- UBE x 6 mins level 2 for conditioning, seated in w/c Pt transferred scoot pivot without sliding board to mat with supervision Seated on mat closed chain shoulder flexion and biceps curls 10 reps each, min v.c  Biceps curls x 20 reps with 3lbs weight bilateral UE's, min v.c shoulder flexion bilateal UE's with 2 lbs weight in each hand, 15 reps. Pt practiced scooting on mat with cueing to shift weight fowrard and pushdown through hands to scoot, pt also practiced using this technique for a sliding board transfer back to w/c. min v.c Pt's wife rports pt an't go into the kitchen because he can't remove his leg rests. Pt practiced removing and reattaching w/c leg rests with min v.c the pt practiced walking his w/c forwards with legs while assisting aby pushing wheels. see education  04/22/23 eval  PATIENT EDUCATION: Education details:   Positioning of Ues, Iontophoresis Instructions, Precautions/Contraindications. Person educated: Patient and Spouse Education method: Explanation, Verbal cues, and Handouts Education comprehension: verbalized understanding,  HOME EXERCISE PROGRAM:  red band and vertical cane 05/07/23   GOALS: Goals reviewed with patient? Yes  SHORT TERM GOALS:  Target date: 07/01/22  I with inital HEP.  Goal status: ongoing, 06/01/23  2.  Pt will peform LB dressing with min A  Goal status: ongoing, mod A 06/01/23  3.  Pt will consistently perform toilet transfers with supervision.  Goal status: ongoing, supervision with transfer, min A hygeine  4.  Pt will perform snack or beverage prep at a w/c level  Goal status: ongoing, dependent 06/01/23  5. Pt will verbalize understanding of back precautions.  Goal status: ongoing, 06/01/23, reveiwed BAT, however pt needs reinforcement    LONG TERM GOALS: Target date: 08/24/22  I with updated HEP. Baseline:  Goal status:  ongoing  2.  Pt will perform bathing with supervision/ set up  Goal status:  ongoing  3.  Pt will perform LB dressing with supervision/ set up.  Goal status: I ongoing  4.  Pt will demonstrate ability with stand for 5 mins with min A for ADLs/ IADLs.  Goal status:  ongoing  5.  Pt will perfrom simple home management at a walker level with min A.  Goal status:  ongoing   ASSESSMENT:    CLINICAL IMPRESSION: Pt reports that pain is improving with ultrasound.  Pt verbalizes understanding of Ionto instructions/precautions to continue to address bilateral shoulder pain.    PERFORMANCE DEFICITS: in functional  skills including ADLs, IADLs, ROM, strength, pain, flexibility, mobility, balance, body mechanics, endurance, decreased knowledge of precautions, decreased knowledge of use of DME, and UE functional use,  and psychosocial skills including coping strategies, environmental adaptation, habits, interpersonal interactions, and routines and behaviors.   IMPAIRMENTS: are limiting patient from ADLs, IADLs, rest and sleep, work, play, leisure, and social participation.   CO-MORBIDITIES: [place] have co-morbidities  that affects occupational performance. Patient will benefit from skilled OT to address above impairments and improve overall function.  MODIFICATION OR ASSISTANCE  TO COMPLETE EVALUATION: No modification of tasks or assist necessary to complete an evaluation.  OT OCCUPATIONAL PROFILE AND HISTORY: Detailed assessment: Review of records and additional review of physical, cognitive, psychosocial history related to current functional performance.  CLINICAL DECISION MAKING: LOW - limited treatment options, no task modification necessary  REHAB POTENTIAL: Good  EVALUATION COMPLEXITY: Low    PLAN:  OT FREQUENCY: 2x/week  OT DURATION: 12 weeks plus eval  PLANNED INTERVENTIONS: 97168 OT Re-evaluation, 97535 self care/ADL training, 02889 therapeutic exercise, 97530 therapeutic activity, 97112 neuromuscular re-education, 97140 manual therapy, 97116 gait training, 02886 aquatic therapy, 97035 ultrasound, 97018 paraffin, 02989 moist heat, 97010 cryotherapy, passive range of motion, balance training, stair training, functional mobility training, psychosocial skills training, energy conservation, coping strategies training, patient/family education, and DME and/or AE instructions  RECOMMENDED OTHER SERVICES: PT  CONSULTED AND AGREED WITH PLAN OF CARE: Patient  PLAN FOR NEXT SESSION:   monitor pain, US , iontophoresis, ADL performance/ activity modification to minimize shoulder pain   Demontrez Rindfleisch, OTR/L 06/04/2023, 10:27 AM

## 2023-06-04 ENCOUNTER — Ambulatory Visit: Payer: PRIVATE HEALTH INSURANCE | Attending: Physician Assistant | Admitting: Occupational Therapy

## 2023-06-04 ENCOUNTER — Encounter: Payer: Self-pay | Admitting: Occupational Therapy

## 2023-06-04 DIAGNOSIS — M25561 Pain in right knee: Secondary | ICD-10-CM | POA: Insufficient documentation

## 2023-06-04 DIAGNOSIS — R29898 Other symptoms and signs involving the musculoskeletal system: Secondary | ICD-10-CM | POA: Insufficient documentation

## 2023-06-04 DIAGNOSIS — G8929 Other chronic pain: Secondary | ICD-10-CM | POA: Insufficient documentation

## 2023-06-04 DIAGNOSIS — M79622 Pain in left upper arm: Secondary | ICD-10-CM | POA: Diagnosis present

## 2023-06-04 DIAGNOSIS — M6281 Muscle weakness (generalized): Secondary | ICD-10-CM | POA: Insufficient documentation

## 2023-06-04 DIAGNOSIS — M79621 Pain in right upper arm: Secondary | ICD-10-CM | POA: Insufficient documentation

## 2023-06-04 DIAGNOSIS — M25662 Stiffness of left knee, not elsewhere classified: Secondary | ICD-10-CM | POA: Insufficient documentation

## 2023-06-04 DIAGNOSIS — M25562 Pain in left knee: Secondary | ICD-10-CM | POA: Insufficient documentation

## 2023-06-04 DIAGNOSIS — M25612 Stiffness of left shoulder, not elsewhere classified: Secondary | ICD-10-CM | POA: Diagnosis present

## 2023-06-04 DIAGNOSIS — M25661 Stiffness of right knee, not elsewhere classified: Secondary | ICD-10-CM | POA: Insufficient documentation

## 2023-06-04 DIAGNOSIS — M25611 Stiffness of right shoulder, not elsewhere classified: Secondary | ICD-10-CM | POA: Insufficient documentation

## 2023-06-04 DIAGNOSIS — R2689 Other abnormalities of gait and mobility: Secondary | ICD-10-CM | POA: Diagnosis present

## 2023-06-04 DIAGNOSIS — R2681 Unsteadiness on feet: Secondary | ICD-10-CM | POA: Insufficient documentation

## 2023-06-04 NOTE — Patient Instructions (Addendum)
 IONTOPHORESIS PATIENT PRECAUTIONS & CONTRAINDICATIONS:  Redness under one or both electrodes can occur.  This characterized by a uniform redness that usually disappears within 12 hours of treatment. Small pinhead size blisters Silos result in response to the drug.  Contact your physician if the problem persists more than 24 hours. On rare occasions, iontophoresis therapy can result in temporary skin reactions such as rash, inflammation, irritation or burns.  The skin reactions Swords be the result of individual sensitivity to the ionic solution used, the condition of the skin at the start of treatment, reaction to the materials in the electrodes, allergies or sensitivity to dexamethasone , or a poor connection between the patch and your skin.  Discontinue using iontophoresis if you have any of these reactions and report to your therapist. Remove the Patch or electrodes if you have any undue sensation of pain or burning during the treatment and report discomfort to your therapist. Tell your Therapist if you have had known adverse reactions to the application of electrical current. If using the Patch, Approximate treatment time is 1-3 hours.  Remove the patch after 4 hours. The Patch can be worn during normal activity, however excessive motion where the electrodes have been placed can cause poor contact between the skin and the electrode or uneven electrical current resulting in greater risk of skin irritation. Keep out of the reach of children.   DO NOT use if you have a cardiac pacemaker or any other electrically sensitive implanted device. DO NOT use if you have a known sensitivity to dexamethasone . DO NOT use during Magnetic Resonance Imaging (MRI). DO NOT use over broken or compromised skin (e.g. sunburn, cuts, or acne) due to the increased risk of skin reaction. DO NOT SHAVE over the area to be treated:  To establish good contact between the Patch and the skin, excessive hair Kassa be clipped. DO  NOT place the Patch or electrodes on or over your eyes, directly over your heart, or brain. DO NOT reuse the Patch or electrodes as this [place] cause burns to occur.

## 2023-06-08 ENCOUNTER — Encounter: Payer: Self-pay | Admitting: Physical Therapy

## 2023-06-08 ENCOUNTER — Ambulatory Visit: Payer: PRIVATE HEALTH INSURANCE | Admitting: Physical Therapy

## 2023-06-08 DIAGNOSIS — M25662 Stiffness of left knee, not elsewhere classified: Secondary | ICD-10-CM

## 2023-06-08 DIAGNOSIS — M6281 Muscle weakness (generalized): Secondary | ICD-10-CM | POA: Diagnosis not present

## 2023-06-08 DIAGNOSIS — M25661 Stiffness of right knee, not elsewhere classified: Secondary | ICD-10-CM

## 2023-06-08 DIAGNOSIS — G8929 Other chronic pain: Secondary | ICD-10-CM

## 2023-06-08 DIAGNOSIS — R2681 Unsteadiness on feet: Secondary | ICD-10-CM

## 2023-06-08 NOTE — Therapy (Signed)
 OUTPATIENT PHYSICAL THERAPY CERVICAL/THORACIC TREATMENT   Patient Name: Raymond [name] MRN: [id number] DOB:11-03-1965, 58 y.o., male Today's Date: 06/08/2023  END OF SESSION:  PT End of Session - 06/08/23 1711     Visit Number 9    Date for PT Re-Evaluation 07/27/23    PT Start Time 1630    PT Stop Time 1705    PT Time Calculation (min) 35 min    Activity Tolerance Patient limited by pain    Behavior During Therapy [facility] for tasks assessed/performed             Past Medical History:  Diagnosis Date   Acute kidney injury superimposed on chronic kidney disease (HCC) 04/13/2023   Acute on chronic anemia 04/13/2023   Anemia    Anxiety    Back pain    COPD (chronic obstructive pulmonary disease) (HCC)    Depression    Diabetes mellitus without complication (HCC)    Diverticulosis    with hx of LGIB   Edema    GERD (gastroesophageal reflux disease)    GIB (gastrointestinal bleeding)    Gout    Heavy smoker    Hyperlipidemia    Hypertension    Iron deficiency anemia    Sleep apnea    uses a cpap   Swelling of lower extremity    Past Surgical History:  Procedure Laterality Date   CHONDROPLASTY Left 06/28/2014   Procedure: CHONDROPLASTY;  Surgeon: LELON JONETTA Shari Mickey., MD;  Location: [facility];  Service: Orthopedics;  Laterality: Left;   COLONOSCOPY     FOOT FASCIOTOMY     age 24-rt   KNEE ARTHROSCOPY WITH LATERAL MENISECTOMY Left 06/28/2014   Procedure: KNEE ARTHROSCOPY WITH LATERAL MENISECTOMY;  Surgeon: LELON JONETTA Shari Mickey., MD;  Location: [facility];  Service: Orthopedics;  Laterality: Left;   KNEE ARTHROSCOPY WITH MEDIAL MENISECTOMY Left 06/28/2014   Procedure: LEFT KNEE ARTHROSCOPY CHONDROPLASTY/WITH MEDIAL/LATERAL MENISECTOMIES;  Surgeon: LELON JONETTA Shari Mickey., MD;  Location: [facility];  Service: Orthopedics;  Laterality: Left;   ORIF RADIUS & ULNA FRACTURES  2007   left   THORACIC DISCECTOMY N/A 03/12/2023   Procedure: THORACIC  LAMINECTOMY AND  DISCECTOMY;  Surgeon: Gillie Duncans, MD;  Location: [facility] OR;  Service: Neurosurgery;  Laterality: N/A;   Patient Active Problem List   Diagnosis Date Noted   Cystitis 05/21/2023   Acute diverticulitis 05/21/2023   Vitamin D  deficiency 05/14/2023   Low HDL (under 40) 05/14/2023   GERD (gastroesophageal reflux disease) 04/13/2023   Constipation 04/13/2023   Chronic back pain 04/13/2023   DVT, bilateral lower limbs (HCC) 04/13/2023   Coping style affecting medical condition 03/27/2023   Thoracic myelopathy with LE weakness 03/23/2023   HNP (herniated nucleus pulposus with myelopathy), thoracic 03/12/2023   Hyperlipidemia 12/24/2022   Failure to thrive  in adult 12/24/2022   Tobacco abuse 03/18/2018   BPH associated with nocturia 03/11/2017   History of substance abuse (HCC) 03/11/2017   Restless leg syndrome 03/11/2017   Restrictive lung disease 05/14/2016   OSA on CPAP 05/14/2016   Moderate COPD (chronic obstructive pulmonary disease) (HCC) 05/14/2016   Smoking greater than 40 pack years 05/14/2016   SOB (shortness of breath) 10/06/2014   Edema of extremities 10/06/2014   Benign essential HTN 10/06/2014   Type 2 diabetes mellitus, without long-term current use of insulin  (HCC) 07/18/2011    PCP: [name], MD  REFERRING PROVIDER:  Pegge Toribio PARAS, PA-C   REFERRING  DIAG: M47.14 (ICD-10-CM) - Other spondylosis with myelopathy, thoracic region   THERAPY DIAG:  Muscle weakness (generalized)  Stiffness of right knee, not elsewhere classified  Chronic pain of right knee  Chronic pain of left knee  Stiffness of left knee, not elsewhere classified  Unsteadiness on feet  Rationale for Evaluation and Treatment: Rehabilitation  ONSET DATE: October 2024  SUBJECTIVE:                                                                                                                                                                                                          SUBJECTIVE STATEMENT: Pt reports he is sore today, everything aches.  From eval: Pt states he fell, broke his ankle and was found to later have a ruptured disc and had to get back surgery. Went to inpatient rehab x 3 weeks. Felt like he built up his upper body. Has now been home x 1 week. States he has not been doing much since then -- has done some sitting exercises. Was able to stand for 3 min bouts for a total of about 12 min using RW at inpatient rehab. Has history of bad knees and will be getting gel shots. 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 He was found to have large HNP T10/T11 and underwent T10 laminectomy with discectomy by Dr. Gillie on 03/12/2023   PAIN:  Are you having pain? No  PRECAUTIONS: None  RED FLAGS: None     WEIGHT BEARING RESTRICTIONS: No  FALLS:  Has patient fallen in last 6 months? Yes. Number of falls 1 -- last fall Oct 2024  LIVING ENVIRONMENT: Lives with: lives with their spouse Lives in: House/apartment Stairs:  ramp Has following equipment at home: Environmental Consultant - 2 wheeled, Wheelchair (manual), and upright walker, hospital bed  OCCUPATION: Retired  PLOF:  Able to do his own ADLs but easier to have wife assist him  PATIENT GOALS: Walk with the walker at least household distances  NEXT MD VISIT: 04/21/23 [place]  OBJECTIVE:  Note: Objective measures were completed at Evaluation unless otherwise noted.  DIAGNOSTIC FINDINGS:  03/12/23 MRI THORACIC SPINE IMPRESSION:   1. Moderate-sized lobulated left subarticular to foraminal disc protrusion at T10-11 with resultant severe spinal stenosis. Cord is compressed and deviated to the right at this level.  Associated cord signal changes concerning for edema and/or myelomalacia. 2. No other acute abnormality within the thoracic spine. 3. Degenerative spondylosis at T8-9 through T11-12 without significant spinal stenosis. Associated moderate to severe bilateral foraminal narrowing at these levels as above.  MRI LUMBAR SPINE IMPRESSION:   1. No acute abnormality within the lumbar spine. 2. Multifactorial degenerative changes at L4-5 with resultant mild canal with severe left and moderate right lateral recess stenosis, with severe bilateral L4 foraminal narrowing. 3. Additional mild noncompressive disc bulging and facet hypertrophy elsewhere within the lumbar spine as above. No other significant stenosis or frank neural impingement.   Critical Value/emergent results were called by telephone at the time of interpretation on 03/12/2023 at 12:41 am to provider Dr. Griselda, who verbally acknowledged these results.  PATIENT SURVEYS:  FOTO 42; predicted 35  COGNITION: Overall cognitive status: Within functional limits for tasks assessed  SENSATION: WFL  POSTURE: rounded shoulders and forward head  PALPATION: No overt tenderness to palpation   CERVICAL ROM: WNL  LOWER EXTREMITY MMT:    MMT Right eval Left eval  Hip flexion 3+ 3  Hip extension 3+ 3+  Hip abduction 3+ 3+  Hip adduction    Hip internal rotation    Hip external rotation    Knee flexion 4- 3+  Knee extension 4- 3+  Ankle dorsiflexion    Ankle plantarflexion    Ankle inversion    Ankle eversion     (Blank rows = not tested)   FUNCTIONAL TESTS:  Transfers:  Chair to bed: Multiple seated scoots with UEs  Sit<>stand: max A -- limited due to knee pain Supine to sit<>sit: min A for trunk and LE negotiation. Not performing log rolling  TODAY'S TREATMENT:                                                                                                                              DATE:  06/08/23 Sliding board  transfer up minimal grade to mat, S after A with board placement. Sit to supine, S Patient's legs began spasming, and he reported pain and poor sensory awareness of LEs in supine. Elevated legs on physioball, with helped. Supine gentle DKTC, bridge, hip IR with 5 sec hold and bridge with ball roll, x 10 each to patient tolerance. Rolled to L side and attempted hip abd, but his spasms returned and were painful. Sup to sit with min A. He reported continued diminished sensory feedback.  06/01/23 New W/C adjustment- armrests raised to better support his elbows at rest Sliding board transfer to mat, min A for board placement, then S, improved scooting, still required many scoots. Sit to supine with S Repeated rolling to each side lifting the leg to cross body rather than push on mat, 5 x each way. Very challenging, but complete. Sup to sit with S. Sit to stand in parallel bars. First attempt, unable to stand, second and third attempts, reliant on UE support, but  able to achieve full stance. X 10 sec. Mod A for standing  05/20/23 Assessed L shoulder due to increased pain. Negative full can, belly press, resisted ER, positive empty can, Hawkin's. TTP mildly over L supraspinatus muscle and tendon, biceps tendon. Sliding board transfer to mat with S, VC to push with legs to assist. Seated L shoulder distraction holding the edge of mat and leaning to R. Seated press ups for scapular stability. Moved sit to supine with S. His feet and ankles appear more swollen today. He attemtped to push into physioball to activate LE. However, his legs kept spasming, so this was deferred. Attempted sit to stand from elevated mat, but he was unable to raise hips today.  05/14/23 Transfer to mat, Challenging for level transfer, but I Sit to Supine Mi, improved ability to lift legs to surface Supine with legs over G physioball- bridge, DKTC, quick side to side rotations, alternating lifting one leg off ball to mat and  back up, 10 reps each. Supine to long sit, then scoot hips forward to the edge of mat, very challenging, but able to accomplish with VC from therapist for correct technique, lean and scoot. Fitter press, 1 x 10 with 1 blue band, then 1 x 5 with 2 blue bands, focus Standing in parallel bars from elevated W/C seat. Pushes up from bars with min A and extra time, still largely reliant on UE support, but trying to move and relax arms to rely on his legs more. Stood 3 x 30 sec, CGA once up with close guarding at knees in case of buckling.  05/11/23 W/C > mat scoot transfer with S. Difficulty getting started, but completed without physical A. Moved into longsit on mat, then scooted over and backwards. Performed multiple movements on the mat in order to facilitate active trunk stability, including prone lying, attempted to move to quadruped, but his knees kept slipping back as he tried to push up. Rolled to R and performed L hip abd, repeated on L sidelie for R hip. Sup to sit on edge of mat with much less effort required today, S Sit<> partial stand from elevated mat with hands on chair in front of him, able to come to stand with hands on seat, then place hands on the armrests of the chair. 3 reps. Sit to stand with mod A to initiate and given extra time to rise. Parallel bars- Sit to stand from W/C with Airex pad in seat to elevate slightly. Pushed up on parallel bars. Able to achieve fully upright stance and perform lat weight shifts, but had difficulty once he tried to lift either foot to simulate taking a step, poor control of limb in space.  05/07/23 Sliding board transfer to mat using lean technique with shoulders away from hips to initiate. He then was able to lift his hips and scoot the rest of the way. Supine bridging x 10, able to clear hips Repeated roll to each side x 3 using trunk into flexion. Fitter press with 2 blue bands x 10 each leg, very challenging. Functional trunk and LE strengthening,  seated on mat, turn to R, walking both hands away from trunk, then lifting L LE to touch heel to surface of mat. X 5 lifts to each side, very challenging. Sit<> partial stand from elevated mat with hands on chair in front of him, able to come to stand with hands on seat, then place hands on the armrests of the chair. Moved to parallel bars with Oneok  ex pad on top of his W/C cushion to elevate him. Sit > Stand pulling bars, with mod A and time to push into full upright posture where he stood x approx 30 seconds. Relies on arms to A.  05/06/23 Lateral transfers from W/C <> mat with min A. Sit to supine with light min A to BLE Rolled to L- Therapist facilitated lower trunk active mobilization in ant/post and inf/sup planes of movement. Moved to R LE with acitve hip abd, very weak, tends to turn and use quads, clamshell also required mod A. Facilitated repeated rolling using normalized technique with RLE and UE held in the air to engage trunk. Very challenging, but able to complete x 4. Repeated all activities in R side lying. Moved from Sup > long sit I, but did report some strain in back Sit <> partial stand from elevated mat, hands on the seat or armrests of a chair facing him, required +2 to initiate due to B knee pain, but he was able to rise into bent over standing position x5 and hold for up to 5 seconds. B scoot transfers, emphasizing lifting hips off the surface by pushing through legs, x 5 each way on mat Transfer back to W/C with CGA. Mat > W/C with CGA  04/23/23 Seated heel slide 2x10 Transfer x2; min A to stabilize w/c while getting to bed. Pt utilizing multiple scoots and UEs SLR short range x10 (>10 deg extensor lag bilat) Quad set 2x10 SAQ 2# ankle weight 2x10 Supine clamshell green TB 2x10 Bridge 2x10 Supine to sit log roll with SBA for v/cs In // bars pulling up for 1/4 to standing 2x5  In w/c pushing with legs only 2x10' In w/c pulling with legs only 2x10'  04/21/23 See HEP  below   PATIENT EDUCATION:  Education details: Exam findings, POC, initial HEP Person educated: Patient Education method: Explanation, Demonstration, and Handouts Education comprehension: verbalized understanding, returned demonstration, and needs further education  HOME EXERCISE PROGRAM: Access Code: [email or web address] URL: [email or web address] Date: 04/21/2023 Prepared by: Gellen April Marie Nonato  Exercises - Staggered Bridge  - 1 x daily - 7 x weekly - 2 sets - 10 reps - Hooklying Isometric Clamshell  - 1 x daily - 7 x weekly - 2 sets - 10 reps - Small Range Straight Leg Raise  - 1 x daily - 7 x weekly - 2 sets - 10 reps  ASSESSMENT:  CLINICAL IMPRESSION: Patient arrived today feeling poorly due to weather. He performed his best lateral transfer to mat. However, when he moved to supine, he experienced pain, spasm, and reported sensory loss in BLE. Attempted to perform some stabilization exercises, but he continued to have the issues, so treatment deferred. He has a Dr appointment tomorrow and will report the noted changes in his pain and sensation as well as increased muscle spasms. This was the first time he demonstrated spasms in therapy, but he reports that he does have some at home, increasing in frequency recently.  From eval: Patient is a 58 y.o. M who was seen today for physical therapy evaluation and treatment for thoracic myelopathy. PMH significant for bilat knee OA (pt to get gel shots and is interested in future TKA once he gets his weight down) and T10 laminectomy with discectomy by Dr. Gillie on 03/12/2023. Went to inpatient rehab x 3 weeks. Has now been at home x 1 week. Pt states he does not currently have any known precautions. Assessment significant for gross bilat LE weakness (  L LE weaker than R) affecting standing tolerance, transfers and balance. Pt is limited due to his knee pain. Pt will highly benefit from PT to address these deficits to reach pt's goals for  increased mobility and amb.   OBJECTIVE IMPAIRMENTS: Abnormal gait, decreased balance, decreased endurance, decreased mobility, difficulty walking, decreased ROM, decreased strength, increased edema, improper body mechanics, postural dysfunction, obesity, and pain.   GOALS: Goals reviewed with patient? Yes  SHORT TERM GOALS: Target date: 04/15/24   Pt will be ind with initial HEP Baseline:  Goal status: 05/06/23- provided, but inconsistent in performing. Ongoing  2.  Pt will be able to tolerate standing x 1 min in the parallel bars with BUE support and CGA Baseline: 06/01/23- Goal updated after recent hospitalization Goal status: 06/01/23-Was standing x 30 sec before hospitalization  3.  Pt will be able to perform sliding board transfer with S, including set up, and up a slight (3) grade Baseline: 06/01/23- Goal updated after recent hospitalization Goal status: 06/01/23- Min A for set up, S for level transfer, required mult scoots   LONG TERM GOALS: Target date: 07/14/2023   Pt will be ind with management and progression of HEP Baseline:  Goal status: INITIAL  2.  Pt will be able to amb at least 20' with RW min A for home mobility Baseline:  Goal status: 06/01/23- Stands x up to 30 sec with BUE support in parallel bars, ongoing  3.  Pt will be able to perform at least 2 reps of sit to stand in 30 sec with UE support Baseline: Unable to perform complete STS without mod A Goal status: 06/01/23- updated  4.  Pt will have increased FOTO to >/=54 Baseline:  Goal status: INITIAL  PLAN:  PT FREQUENCY: 2x/week  PT DURATION: 12 weeks  PLANNED INTERVENTIONS: 97164- PT Re-evaluation, 97110-Therapeutic exercises, 97530- Therapeutic activity, [id number]- Neuromuscular re-education, 97535- Self Care, [id number]- Manual therapy, [id number]- Gait training, [phone]- Orthotic Fit/training, [phone]- Aquatic Therapy, 97014- Electrical stimulation (unattended), 97035- Ultrasound, [id number]- Ionotophoresis 4mg /ml  Dexamethasone , Patient/Family education, Balance training, Stair training, Taping, Dry Needling, Joint mobilization, Spinal mobilization, Cryotherapy, and Moist heat  PLAN FOR NEXT SESSION: Assess response to HEP. Continue to work on LE strengthening and transfers/standing.    Devere CHRISTELLA Mean, DPT 06/08/2023, 5:13 PM

## 2023-06-09 ENCOUNTER — Ambulatory Visit: Payer: Managed Care, Other (non HMO) | Admitting: Bariatrics

## 2023-06-09 ENCOUNTER — Telehealth: Payer: Self-pay | Admitting: Physical Medicine and Rehabilitation

## 2023-06-09 NOTE — Telephone Encounter (Signed)
 Patient wife came into office. Patient was seen by Dr Coletta Memos. She is stating Dr. York Spaniel his pain and muscle relaxer medications need to by increased.

## 2023-06-12 ENCOUNTER — Other Ambulatory Visit: Payer: Self-pay | Admitting: Neurosurgery

## 2023-06-12 ENCOUNTER — Ambulatory Visit: Payer: PRIVATE HEALTH INSURANCE | Admitting: Occupational Therapy

## 2023-06-12 ENCOUNTER — Ambulatory Visit: Payer: PRIVATE HEALTH INSURANCE

## 2023-06-12 ENCOUNTER — Encounter
Payer: No Typology Code available for payment source | Attending: Physical Medicine and Rehabilitation | Admitting: Physical Medicine and Rehabilitation

## 2023-06-12 DIAGNOSIS — G8918 Other acute postprocedural pain: Secondary | ICD-10-CM | POA: Diagnosis not present

## 2023-06-12 DIAGNOSIS — M5104 Intervertebral disc disorders with myelopathy, thoracic region: Secondary | ICD-10-CM

## 2023-06-12 MED ORDER — BACLOFEN 10 MG PO TABS: 10.0000 mg | ORAL_TABLET | Freq: Four times a day (QID) | ORAL | 0 refills | Status: DC

## 2023-06-12 NOTE — Progress Notes (Signed)
 Subjective:     Patient ID: Raymond [name], male   DOB: 31-Jan-1966, 58 y.o.   MRN: [id number]  HPI An audio/video tele-health visit is felt to be the most appropriate encounter for this patient at this time. This is a follow up tele-visit via phone. The patient is at home. MD is at office. Prior to scheduling this appointment, our staff discussed the limitations of evaluation and management by telemedicine and the availability of in-person appointments. The patient expressed understanding and agreed to proceed.   Raymond [name] is a 58 year old man who presents for f/u up of thoracic myelopathy.  1) Thoracic myelopathy: -improving slowly but surely -transferring independently  -spends a lot of time in the recliner  2) HTN:  -had  red bull today  3) Left flank nodule:  -painful at times.   4) Postoperative pain: -pain has been severe -he is almost out of oxycodone  -he does not have pain contract or urine sample performed with us  -patient states that his neurosurgeon recommended increasing his pain medication -the baclofen  does help  Pain Inventory Average Pain 7 Pain Right Now 3 My pain is intermittent, sharp, burning, dull, stabbing, tingling, and aching  In the last 24 hours, has pain interfered with the following? General activity 0 Relation with others 0 Enjoyment of life 4 What TIME of day is your pain at its worst? morning , daytime, evening, night, and varies Sleep (in general) Poor  Pain is worse with: sitting and some activites Pain improves with: rest, heat/ice, therapy/exercise, and medication Relief from Meds: 3  ability to climb steps?  no do you drive?  no use a wheelchair transfers alone Do you have any goals in this area?  yes  employed # of hrs/week medical leave I need assistance with the following:  dressing, bathing, meal prep, household duties, and shopping Do you have any goals in this area?  yes  weakness numbness tremor tingling trouble  walking spasms depression anxiety  Any changes since last visit?  no  Any changes since last visit?  no    Family History  Problem Relation Age of Onset   Diabetes Mother    Hypertension Mother    High Cholesterol Mother    Heart disease Mother    Depression Mother    Diabetes Father    Hypertension Father    High Cholesterol Father    Heart disease Father    Cancer Other    Social History   Socioeconomic History   Marital status: Single    Spouse name: Not on file   Number of children: Not on file   Years of education: Not on file   Highest education level: Not on file  Occupational History   Not on file  Tobacco Use   Smoking status: Every Day    Current packs/day: 2.00    Types: Cigarettes   Smokeless tobacco: Never  Vaping Use   Vaping status: Former  Substance and Sexual Activity   Alcohol use: Yes    Comment: rare   Drug use: No   Sexual activity: Not on file  Other Topics Concern   Not on file  Social History Narrative   Not on file   Social Drivers of Health   Financial Resource Strain: Not on file  Food Insecurity: No Food Insecurity (05/21/2023)   Hunger Vital Sign    Worried About Running Out of Food in the Last Year: Never true    Ran Out of Food  in the Last Year: Never true  Transportation Needs: No Transportation Needs (05/21/2023)   PRAPARE - Administrator, Civil Service (Medical): No    Lack of Transportation (Non-Medical): No  Physical Activity: Not on file  Stress: No Stress Concern Present (03/21/2022)   Received from [facility], [facility] of Occupational [facility] - Occupational Stress Questionnaire    Feeling of Stress : Not at all  Social Connections: Unknown (01/10/2022)   Received from [facility], [facility]   Social Network    Social Network: Not on file   Past Surgical History:  Procedure Laterality Date   CHONDROPLASTY Left 06/28/2014   Procedure: CHONDROPLASTY;  Surgeon: LELON JONETTA Shari Mickey., MD;  Location: [facility];  Service: Orthopedics;  Laterality: Left;   COLONOSCOPY     FOOT FASCIOTOMY     age 12-rt   KNEE ARTHROSCOPY WITH LATERAL MENISECTOMY Left 06/28/2014   Procedure: KNEE ARTHROSCOPY WITH LATERAL MENISECTOMY;  Surgeon: LELON JONETTA Shari Mickey., MD;  Location: [facility];  Service: Orthopedics;  Laterality: Left;   KNEE ARTHROSCOPY WITH MEDIAL MENISECTOMY Left 06/28/2014   Procedure: LEFT KNEE ARTHROSCOPY CHONDROPLASTY/WITH MEDIAL/LATERAL MENISECTOMIES;  Surgeon: LELON JONETTA Shari Mickey., MD;  Location: [facility];  Service: Orthopedics;  Laterality: Left;   ORIF RADIUS & ULNA FRACTURES  2007   left   THORACIC DISCECTOMY N/A 03/12/2023   Procedure: THORACIC LAMINECTOMY AND  DISCECTOMY;  Surgeon: Gillie Duncans, MD;  Location: [facility] OR;  Service: Neurosurgery;  Laterality: N/A;   Past Medical History:  Diagnosis Date   Acute kidney injury superimposed on chronic kidney disease (HCC) 04/13/2023   Acute on chronic anemia 04/13/2023   Anemia    Anxiety    Back pain    COPD (chronic obstructive pulmonary disease) (HCC)    Depression    Diabetes mellitus without complication (HCC)    Diverticulosis    with hx of LGIB   Edema    GERD (gastroesophageal reflux disease)    GIB (gastrointestinal bleeding)    Gout    Heavy smoker    Hyperlipidemia    Hypertension    Iron deficiency anemia    Sleep apnea    uses a cpap   Swelling of lower extremity    There were no vitals taken for this visit.  Opioid Risk Score:   Fall Risk Score:  `1  Depression screen PHQ 2/9      No data to display          Review of Systems  Musculoskeletal:  Positive for gait problem.       Spasms  Neurological:  Positive for tremors, weakness and numbness.       Tingling  Psychiatric/Behavioral:         Depression, anxiety  All other systems reviewed and are negative.      Objective:   Physical Exam PRIOR EXAM Gen: no distress, normal  appearing HEENT: oral mucosa pink and moist, NCAT Cardio: Reg rate Chest: normal effort, normal rate of breathing Abd: soft, non-distended Ext: no edema Psych: pleasant, normal affect Skin: intact Neuro: Alert and oriented x3     Assessment:         Plan:   1) Thoracic myelopathy: -continue therapy -discussed his postoperative pain -discussed that we need pain contact and urine sample before we can prescribe opioids for him, increased baclofen  to 10mg  QID  2) Obesity: -Educated regarding health benefits of weight  loss- for pain, general health, chronic disease prevention, immune health, mental health.  -Will monitor weight every visit.  -Consider Roobois tea daily.  -Discussed the benefits of intermittent fasting. -Discussed foods that can assist in weight loss: 1) leafy greens- high in fiber and nutrients 2) dark chocolate- improves metabolism (if prefer sweetened, best to sweeten with honey instead of sugar).  3) cruciferous vegetables- high in fiber and protein 4) full fat yogurt: high in healthy fat, protein, calcium , and probiotics 5) apples- high in a variety of phytochemicals 6) nuts- high in fiber and protein that increase feelings of fullness 7) grapefruit: rich in nutrients, antioxidants, and fiber (not to be taken with anticoagulation) 8) beans- high in protein and fiber 9) salmon- has high quality protein and healthy fats 10) green tea- rich in polyphenols 11) eggs- rich in choline and vitamin D  12) tuna- high protein, boosts metabolism 13) avocado- decreases visceral abdominal fat 14) chicken (pasture raised): high in protein and iron 15) blueberries- reduce abdominal fat and cholesterol 16) whole grains- decreases calories retained during digestion, speeds metabolism 17) chia seeds- curb appetite 18) chilies- increases fat metabolism  -Discussed supplements that can be used:  1) Metatrim 400mg  BID 30 minutes before breakfast and dinner  2) Sphaeranthus  indicus and Garcinia mangostana (combinations of these and #1 can be found in capsicum and zychrome  3) green coffee bean extract 400mg  twice per day or Irvingia (african mango) 150 to 300mg  twice per day.   3) HTN: -Advised checking BP daily at home and logging results to bring into follow-up appointment with PCP and myself. -Reviewed BP meds today.  -Advised regarding healthy foods that can help lower blood pressure and provided with a list: 1) citrus foods- high in vitamins and minerals 2) salmon and other fatty fish - reduces inflammation and oxylipins 3) swiss chard (leafy green)- high level of nitrates 4) pumpkin seeds- one of the best natural sources of magnesium  5) Beans and lentils- high in fiber, magnesium , and potassium 6) Berries- high in flavonoids 7) Amaranth (whole grain, can be cooked similarly to rice and oats)- high in magnesium  and fiber 8) Pistachios- even more effective at reducing BP than other nuts 9) Carrots- high in phenolic compounds that relax blood vessels and reduce inflammation 10) Celery- contain phthalides that relax tissues of arterial walls 11) Tomatoes- can also improve cholesterol and reduce risk of heart disease 12) Broccoli- good source of magnesium , calcium , and potassium 13) Greek yogurt: high in potassium and calcium  14) Herbs and spices: Celery seed, cilantro, saffron, lemongrass, black cumin, ginseng, cinnamon, cardamom, sweet basil, and ginger 15) Chia and flax seeds- also help to lower cholesterol and blood sugar 16) Beets- high levels of nitrates that relax blood vessels  17) spinach and bananas- high in potassium  -Provided lise of supplements that can help with hypertension:  1) magnesium : one high quality brand is Bioptemizers since it contains all 7 types of magnesium , otherwise over the counter magnesium  gluconate 400mg  is a good option 2) B vitamins 3) vitamin D  4) potassium 5) CoQ10 6) L-arginine 7) Vitamin C 8)  Beetroot -Educated that goal BP is 120/80. -Made goal to incorporate some of the above foods into diet.    4) Diabetes: -check CBGs daily, log, and bring log to follow-up appointment -avoid sugar, bread, pasta, rice -avoid snacking -perform daily foot exam and at least annual eye exam -try to incorporate into your diet some of the following foods which are good for diabetes: 1)  cinnamon- imitates effects of insulin , increasing glucose transport into cells ([place] or Vietnamese cinnamon is best, least processed) 2) nuts- can slow down the blood sugar response of carbohydrate rich foods 3) oatmeal- contains and anti-inflammatory compound avenanthramide 4) whole-milk yogurt (best types are no sugar, Greek yogurt, or goat/sheep yogurt) 5) beans- high in protein, fiber, and vitamins, low glycemic index 6) broccoli- great source of vitamin A and C 7) quinoa- higher in protein and fiber than other grains 8) spinach- high in vitamin A, fiber, and protein 9) olive oil- reduces glucose levels, LDL, and triglycerides 10) salmon- excellent amount of omega-3-fatty acids 11) walnuts- rich in antioxidants 12) apples- high in fiber and quercetin 13) carrots- highly nutritious with low impact on blood sugar 14) eggs- improve HDL (good cholesterol), high in protein, keep you satiated 15) turmeric: improves blood sugars, cardiovascular disease, and protects kidney health 16) garlic: improves blood sugar, blood pressure, pain 17) tomatoes: highly nutritious with low impact on blood sugar   5) Chronic Pain: -Discussed current symptoms of pain and history of pain.  -Discussed benefits of exercise in reducing pain. -Discussed following foods that [place] reduce pain: 1) Ginger (especially studied for arthritis)- reduce leukotriene production to decrease inflammation 2) Blueberries- high in phytonutrients that decrease inflammation 3) Salmon- marine omega-3s reduce joint swelling and pain 4) Pumpkin seeds-  reduce inflammation 5) dark chocolate- reduces inflammation 6) turmeric- reduces inflammation 7) tart cherries - reduce pain and stiffness 8) extra virgin olive oil - its compound olecanthal helps to block prostaglandins  9) chili peppers- can be eaten or applied topically via capsaicin 10) mint- helpful for headache, muscle aches, joint pain, and itching 11) garlic- reduces inflammation  Link to further information on diet for chronic pain: [email or web address]   6) Constipation:  -Provided list of following foods that help with constipation and highlighted a few: 1) prunes- contain high amounts of fiber.  2) apples- has a form of dietary fiber called pectin that accelerates stool movement and increases beneficial gut bacteria 3) pears- in addition to fiber, also high in fructose and sorbitol  which have laxative effect 4) figs- contain an enzyme ficin which helps to speed colonic transit 5) kiwis- contain an enzyme actinidin that improves gut motility and reduces constipation 6) oranges- rich in pectin (like apples) 7) grapefruits- contain a flavanol naringenin which has a laxative effect 8) vegetables- rich in fiber and also great sources of folate, vitamin C, and K 9) artichoke- high in inulin, prebiotic great for the microbiome 10) chicory- increases stool frequency and softness (can be added to coffee) 11) rhubarb- laxative effect 12) sweet potato- high fiber 13) beans, peas, and lentils- contain both soluble and insoluble fiber 14) chia seeds- improves intestinal health and gut flora 15) flaxseeds- laxative effect 16) whole grain rye bread- high in fiber 17) oat bran- high in soluble and insoluble fiber 18) kefir- softens stools -recommended to try at least one of these foods every day.  -drink 6-8 glasses of water per day -walk regularly, especially after meals.      7) Dark stool: -check Hgb  tomorrow  8) Acute on chronic CKD:  BMP ordered last visit    8 minutes in discussion of his postoperative pain, discussed that we need pain contact and urine sample before we can prescribe opioids for him, increased baclofen  to 10mg  QID

## 2023-06-15 ENCOUNTER — Ambulatory Visit: Payer: PRIVATE HEALTH INSURANCE | Admitting: Physical Therapy

## 2023-06-15 ENCOUNTER — Encounter: Payer: Self-pay | Admitting: Physical Therapy

## 2023-06-15 DIAGNOSIS — G8929 Other chronic pain: Secondary | ICD-10-CM

## 2023-06-15 DIAGNOSIS — R2681 Unsteadiness on feet: Secondary | ICD-10-CM

## 2023-06-15 DIAGNOSIS — M25662 Stiffness of left knee, not elsewhere classified: Secondary | ICD-10-CM

## 2023-06-15 DIAGNOSIS — M6281 Muscle weakness (generalized): Secondary | ICD-10-CM

## 2023-06-15 DIAGNOSIS — M25661 Stiffness of right knee, not elsewhere classified: Secondary | ICD-10-CM

## 2023-06-15 NOTE — Therapy (Signed)
 OUTPATIENT PHYSICAL THERAPY CERVICAL/THORACIC TREATMENT   Progress Note Reporting Period 04/21/23 to 06/15/23  See note below for Objective Data and Assessment of Progress/Goals.     Patient Name: Raymond [name] MRN: [id number] DOB:November 16, 1965, 57 y.o., male Today's Date: 06/15/2023  END OF SESSION:  PT End of Session - 06/15/23 1159     Visit Number 10    Date for PT Re-Evaluation 07/27/23    PT Start Time 1144    PT Stop Time 1225    PT Time Calculation (min) 41 min    Equipment Utilized During Treatment Gait belt    Activity Tolerance Patient limited by pain    Behavior During Therapy WFL for tasks assessed/performed            Past Medical History:  Diagnosis Date   Acute kidney injury superimposed on chronic kidney disease (HCC) 04/13/2023   Acute on chronic anemia 04/13/2023   Anemia    Anxiety    Back pain    COPD (chronic obstructive pulmonary disease) (HCC)    Depression    Diabetes mellitus without complication (HCC)    Diverticulosis    with hx of LGIB   Edema    GERD (gastroesophageal reflux disease)    GIB (gastrointestinal bleeding)    Gout    Heavy smoker    Hyperlipidemia    Hypertension    Iron deficiency anemia    Sleep apnea    uses a cpap   Swelling of lower extremity    Past Surgical History:  Procedure Laterality Date   CHONDROPLASTY Left 06/28/2014   Procedure: CHONDROPLASTY;  Surgeon: LELON JONETTA Shari Mickey., MD;  Location: [facility];  Service: Orthopedics;  Laterality: Left;   COLONOSCOPY     FOOT FASCIOTOMY     age 32-rt   KNEE ARTHROSCOPY WITH LATERAL MENISECTOMY Left 06/28/2014   Procedure: KNEE ARTHROSCOPY WITH LATERAL MENISECTOMY;  Surgeon: LELON JONETTA Shari Mickey., MD;  Location: [facility];  Service: Orthopedics;  Laterality: Left;   KNEE ARTHROSCOPY WITH MEDIAL MENISECTOMY Left 06/28/2014   Procedure: LEFT KNEE ARTHROSCOPY CHONDROPLASTY/WITH MEDIAL/LATERAL MENISECTOMIES;  Surgeon: LELON JONETTA Shari Mickey., MD;  Location:  [facility];  Service: Orthopedics;  Laterality: Left;   ORIF RADIUS & ULNA FRACTURES  2007   left   THORACIC DISCECTOMY N/A 03/12/2023   Procedure: THORACIC LAMINECTOMY AND  DISCECTOMY;  Surgeon: Gillie Duncans, MD;  Location: [facility] OR;  Service: Neurosurgery;  Laterality: N/A;   Patient Active Problem List   Diagnosis Date Noted   Cystitis 05/21/2023   Acute diverticulitis 05/21/2023   Vitamin D  deficiency 05/14/2023   Low HDL (under 40) 05/14/2023   GERD (gastroesophageal reflux disease) 04/13/2023   Constipation 04/13/2023   Chronic back pain 04/13/2023   DVT, bilateral lower limbs (HCC) 04/13/2023   Coping style affecting medical condition 03/27/2023   Thoracic myelopathy with LE weakness 03/23/2023   HNP (herniated nucleus pulposus with myelopathy), thoracic 03/12/2023   Hyperlipidemia 12/24/2022   Failure to thrive  in adult 12/24/2022   Tobacco abuse 03/18/2018   BPH associated with nocturia 03/11/2017   History of substance abuse (HCC) 03/11/2017   Restless leg syndrome 03/11/2017   Restrictive lung disease 05/14/2016   OSA on CPAP 05/14/2016   Moderate COPD (chronic obstructive pulmonary disease) (HCC) 05/14/2016   Smoking greater than 40 pack years 05/14/2016   SOB (shortness of breath) 10/06/2014   Edema of extremities 10/06/2014   Benign essential HTN 10/06/2014   Type  2 diabetes mellitus, without long-term current use of insulin  (HCC) 07/18/2011    PCP: [name], MD  REFERRING PROVIDER:  Pegge Toribio PARAS, PA-C   REFERRING DIAG: [phone] (ICD-10-CM) - Other spondylosis with myelopathy, thoracic region   THERAPY DIAG:  Muscle weakness (generalized)  Stiffness of right knee, not elsewhere classified  Chronic pain of right knee  Chronic pain of left knee  Stiffness of left knee, not elsewhere classified  Unsteadiness on feet  Rationale for Evaluation and Treatment: Rehabilitation  ONSET DATE: October 2024  SUBJECTIVE:                                                                                                                                                                                                          SUBJECTIVE STATEMENT: Pt reports he is sore today, everything aches.  From eval: Pt states he fell, broke his ankle and was found to later have a ruptured disc and had to get back surgery. Went to inpatient rehab x 3 weeks. Felt like he built up his upper body. Has now been home x 1 week. States he has not been doing much since then -- has done some sitting exercises. Was able to stand for 3 min bouts for a total of about 12 min using RW at inpatient rehab. Has history of bad knees and will be getting gel shots. 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 He was found to have large HNP T10/T11 and underwent T10 laminectomy with discectomy by Dr. Gillie on 03/12/2023   PAIN:  Are you having pain? No  PRECAUTIONS: None  RED FLAGS: None     WEIGHT BEARING RESTRICTIONS: No  FALLS:  Has patient fallen in last 6 months? Yes. Number of falls 1 -- last fall Oct 2024  LIVING ENVIRONMENT: Lives with: lives with their spouse Lives in: House/apartment Stairs:  ramp Has following equipment at home: Environmental Consultant - 2 wheeled, Wheelchair (manual), and upright walker, hospital bed  OCCUPATION: Retired  PLOF:  Able to do his own ADLs but easier to have wife assist him  PATIENT GOALS: Walk with the walker at least household distances  NEXT MD VISIT: 04/21/23 [place]  OBJECTIVE:  Note: Objective measures were completed at Evaluation unless otherwise noted.  DIAGNOSTIC FINDINGS:  03/12/23 MRI THORACIC SPINE  IMPRESSION:   1.  Moderate-sized lobulated left subarticular to foraminal disc protrusion at T10-11 with resultant severe spinal stenosis. Cord is compressed and deviated to the right at this level. Associated cord signal changes concerning for edema and/or myelomalacia. 2. No other acute abnormality within the thoracic spine. 3. Degenerative spondylosis at T8-9 through T11-12 without significant spinal stenosis. Associated moderate to severe bilateral foraminal narrowing at these levels as above.  MRI LUMBAR SPINE IMPRESSION:   1. No acute abnormality within the lumbar spine. 2. Multifactorial degenerative changes at L4-5 with resultant mild canal with severe left and moderate right lateral recess stenosis, with severe bilateral L4 foraminal narrowing. 3. Additional mild noncompressive disc bulging and facet hypertrophy elsewhere within the lumbar spine as above. No other significant stenosis or frank neural impingement.   Critical Value/emergent results were called by telephone at the time of interpretation on 03/12/2023 at 12:41 am to provider Dr. Griselda, who verbally acknowledged these results.  PATIENT SURVEYS:  FOTO 42; predicted 21  COGNITION: Overall cognitive status: Within functional limits for tasks assessed  SENSATION: WFL  POSTURE: rounded shoulders and forward head  PALPATION: No overt tenderness to palpation   CERVICAL ROM: WNL  LOWER EXTREMITY MMT:    MMT Right eval Left eval  Hip flexion 3+ 3  Hip extension 3+ 3+  Hip abduction 3+ 3+  Hip adduction    Hip internal rotation    Hip external rotation    Knee flexion 4- 3+  Knee extension 4- 3+  Ankle dorsiflexion    Ankle plantarflexion    Ankle inversion    Ankle eversion     (Blank rows = not tested)   FUNCTIONAL TESTS:  Transfers:  Chair to bed: Multiple seated scoots with UEs  Sit<>stand: max A -- limited due to knee pain Supine to sit<>sit: min A for trunk and LE negotiation. Not performing log  rolling  TODAY'S TREATMENT:                                                                                                                              DATE:  06/15/23 NuStep- sliding board transfer up grade, CGA NuStep L3 x 5 minutes, strap around knees to hold better alignment. First with UE support, then legs only. Painful for knees. Status re-assessed for PN. See goals Standing in parallel bars- Able to achieve standing using BUE to push up. He die rely on the UE support in stance, attempted to facilitate decreased UE reliance. Able to reduce the use of UE slightly, but very unstable at knees.  06/08/23 Sliding board transfer up minimal grade to mat, S after A with board placement. Sit to supine, S Patient's legs began spasming, and he reported pain and poor sensory awareness of LEs in supine. Elevated legs on physioball, with helped. Supine gentle DKTC, bridge, hip IR with 5 sec hold and bridge with ball roll, x 10 each to patient tolerance. Rolled to L side  and attempted hip abd, but his spasms returned and were painful. Sup to sit with min A. He reported continued diminished sensory feedback.  06/01/23 New W/C adjustment- armrests raised to better support his elbows at rest Sliding board transfer to mat, min A for board placement, then S, improved scooting, still required many scoots. Sit to supine with S Repeated rolling to each side lifting the leg to cross body rather than push on mat, 5 x each way. Very challenging, but complete. Sup to sit with S. Sit to stand in parallel bars. First attempt, unable to stand, second and third attempts, reliant on UE support, but able to achieve full stance. X 10 sec. Mod A for standing  05/20/23 Assessed L shoulder due to increased pain. Negative full can, belly press, resisted ER, positive empty can, Hawkin's. TTP mildly over L supraspinatus muscle and tendon, biceps tendon. Sliding board transfer to mat with S, VC to push with legs to  assist. Seated L shoulder distraction holding the edge of mat and leaning to R. Seated press ups for scapular stability. Moved sit to supine with S. His feet and ankles appear more swollen today. He attemtped to push into physioball to activate LE. However, his legs kept spasming, so this was deferred. Attempted sit to stand from elevated mat, but he was unable to raise hips today.  05/14/23 Transfer to mat, Challenging for level transfer, but I Sit to Supine Mi, improved ability to lift legs to surface Supine with legs over G physioball- bridge, DKTC, quick side to side rotations, alternating lifting one leg off ball to mat and back up, 10 reps each. Supine to long sit, then scoot hips forward to the edge of mat, very challenging, but able to accomplish with VC from therapist for correct technique, lean and scoot. Fitter press, 1 x 10 with 1 blue band, then 1 x 5 with 2 blue bands, focus Standing in parallel bars from elevated W/C seat. Pushes up from bars with min A and extra time, still largely reliant on UE support, but trying to move and relax arms to rely on his legs more. Stood 3 x 30 sec, CGA once up with close guarding at knees in case of buckling.  05/11/23 W/C > mat scoot transfer with S. Difficulty getting started, but completed without physical A. Moved into longsit on mat, then scooted over and backwards. Performed multiple movements on the mat in order to facilitate active trunk stability, including prone lying, attempted to move to quadruped, but his knees kept slipping back as he tried to push up. Rolled to R and performed L hip abd, repeated on L sidelie for R hip. Sup to sit on edge of mat with much less effort required today, S Sit<> partial stand from elevated mat with hands on chair in front of him, able to come to stand with hands on seat, then place hands on the armrests of the chair. 3 reps. Sit to stand with mod A to initiate and given extra time to rise. Parallel bars-  Sit to stand from W/C with Airex pad in seat to elevate slightly. Pushed up on parallel bars. Able to achieve fully upright stance and perform lat weight shifts, but had difficulty once he tried to lift either foot to simulate taking a step, poor control of limb in space.  05/07/23 Sliding board transfer to mat using lean technique with shoulders away from hips to initiate. He then was able to lift his hips and scoot the  rest of the way. Supine bridging x 10, able to clear hips Repeated roll to each side x 3 using trunk into flexion. Fitter press with 2 blue bands x 10 each leg, very challenging. Functional trunk and LE strengthening, seated on mat, turn to R, walking both hands away from trunk, then lifting L LE to touch heel to surface of mat. X 5 lifts to each side, very challenging. Sit<> partial stand from elevated mat with hands on chair in front of him, able to come to stand with hands on seat, then place hands on the armrests of the chair. Moved to parallel bars with Air ex pad on top of his W/C cushion to elevate him. Sit > Stand pulling bars, with mod A and time to push into full upright posture where he stood x approx 30 seconds. Relies on arms to A.  05/06/23 Lateral transfers from W/C <> mat with min A. Sit to supine with light min A to BLE Rolled to L- Therapist facilitated lower trunk active mobilization in ant/post and inf/sup planes of movement. Moved to R LE with acitve hip abd, very weak, tends to turn and use quads, clamshell also required mod A. Facilitated repeated rolling using normalized technique with RLE and UE held in the air to engage trunk. Very challenging, but able to complete x 4. Repeated all activities in R side lying. Moved from Sup > long sit I, but did report some strain in back Sit <> partial stand from elevated mat, hands on the seat or armrests of a chair facing him, required +2 to initiate due to B knee pain, but he was able to rise into bent over standing  position x5 and hold for up to 5 seconds. B scoot transfers, emphasizing lifting hips off the surface by pushing through legs, x 5 each way on mat Transfer back to W/C with CGA. Mat > W/C with CGA  04/23/23 Seated heel slide 2x10 Transfer x2; min A to stabilize w/c while getting to bed. Pt utilizing multiple scoots and UEs SLR short range x10 (>10 deg extensor lag bilat) Quad set 2x10 SAQ 2# ankle weight 2x10 Supine clamshell green TB 2x10 Bridge 2x10 Supine to sit log roll with SBA for v/cs In // bars pulling up for 1/4 to standing 2x5  In w/c pushing with legs only 2x10' In w/c pulling with legs only 2x10'  04/21/23 See HEP below   PATIENT EDUCATION:  Education details: Exam findings, POC, initial HEP Person educated: Patient Education method: Explanation, Demonstration, and Handouts Education comprehension: verbalized understanding, returned demonstration, and needs further education  HOME EXERCISE PROGRAM: Access Code: [email or web address] URL: [email or web address] Date: 04/21/2023 Prepared by: Gellen April Marie Nonato  Exercises - Staggered Bridge  - 1 x daily - 7 x weekly - 2 sets - 10 reps - Hooklying Isometric Clamshell  - 1 x daily - 7 x weekly - 2 sets - 10 reps - Small Range Straight Leg Raise  - 1 x daily - 7 x weekly - 2 sets - 10 reps  ASSESSMENT:  CLINICAL IMPRESSION: Patient's functional status re-assessed for PN. In spite of several set backs, he has progressed toward his LTG. His B knee pain does limit his use of legs for transfers and standing, but he has progressed with his strength and also does use the legs more for pushing. He would benefit from continued PT in order to maximize his recovery and I with his functional mobility.  From eval: Patient is a 58  y.o. CHRISTELLA who was seen today for physical therapy evaluation and treatment for thoracic myelopathy. PMH significant for bilat knee OA (pt to get gel shots and is interested in future TKA once he gets  his weight down) and T10 laminectomy with discectomy by Dr. Gillie on 03/12/2023. Went to inpatient rehab x 3 weeks. Has now been at home x 1 week. Pt states he does not currently have any known precautions. Assessment significant for gross bilat LE weakness (L LE weaker than R) affecting standing tolerance, transfers and balance. Pt is limited due to his knee pain. Pt will highly benefit from PT to address these deficits to reach pt's goals for increased mobility and amb.   OBJECTIVE IMPAIRMENTS: Abnormal gait, decreased balance, decreased endurance, decreased mobility, difficulty walking, decreased ROM, decreased strength, increased edema, improper body mechanics, postural dysfunction, obesity, and pain.   GOALS: Goals reviewed with patient? Yes  SHORT TERM GOALS: Target date: 04/15/24   Pt will be ind with initial HEP Baseline:  Goal status: 05/06/23- provided, but inconsistent in performing. Ongoing  2.  Pt will be able to tolerate standing x 1 min in the parallel bars with BUE support and CGA Baseline: 06/01/23- Goal updated after recent hospitalization Goal status: 06/15/23- Stands x up to 30 sec with BUE support in parallel bars, Improved ability to slide hands with slightly less reliance on UE support.ongoing  3.  Pt will be able to perform sliding board transfer with S, including set up, and up a slight (3) grade Baseline: 06/01/23- Goal updated after recent hospitalization Goal status: 06/15/23- Min A for set up, S for level transfer, required mult scoots, improved excursion with shifts.   LONG TERM GOALS: Target date: 07/14/2023   Pt will be ind with management and progression of HEP Baseline:  Goal status: INITIAL  2.  Pt will be able to amb at least 20' with RW min A for home mobility Baseline:  Goal status: 06/15/23- Stands x up to 30 sec with BUE support in parallel bars, Improved ability to slide hands with slightly less reliance on UE support.ongoing  3.  Pt will be  able to perform at least 2 reps of sit to stand in 30 sec with UE support Baseline: Unable to perform complete STS without mod A Goal status: 06/15/23-Unable to stand without UE support, ongoing  4.  Pt will have increased FOTO to >/=54 Baseline:  Goal status: 06/15/23-26.8, ongoing  PLAN:  PT FREQUENCY: 2x/week  PT DURATION: 12 weeks  PLANNED INTERVENTIONS: 97164- PT Re-evaluation, 97110-Therapeutic exercises, 97530- Therapeutic activity, 97112- Neuromuscular re-education, 97535- Self Care, 02859- Manual therapy, [id number]- Gait training, [phone]- Orthotic Fit/training, [phone]- Aquatic Therapy, 97014- Electrical stimulation (unattended), [id number]- Ultrasound, 02966- Ionotophoresis 4mg /ml Dexamethasone , Patient/Family education, Balance training, Stair training, Taping, Dry Needling, Joint mobilization, Spinal mobilization, Cryotherapy, and Moist heat  PLAN FOR NEXT SESSION: Assess response to HEP. Continue to work on LE strengthening and transfers/standing.    Devere CHRISTELLA Mean, DPT 06/15/2023, 2:17 PM

## 2023-06-16 ENCOUNTER — Ambulatory Visit (INDEPENDENT_AMBULATORY_CARE_PROVIDER_SITE_OTHER): Payer: No Typology Code available for payment source | Admitting: Bariatrics

## 2023-06-16 ENCOUNTER — Ambulatory Visit: Payer: PRIVATE HEALTH INSURANCE | Admitting: Occupational Therapy

## 2023-06-16 ENCOUNTER — Encounter: Payer: Self-pay | Admitting: Bariatrics

## 2023-06-16 VITALS — BP 179/100 | HR 70 | Temp 97.3°F | Ht 71.0 in | Wt 322.0 lb

## 2023-06-16 DIAGNOSIS — M6281 Muscle weakness (generalized): Secondary | ICD-10-CM | POA: Diagnosis not present

## 2023-06-16 DIAGNOSIS — R2689 Other abnormalities of gait and mobility: Secondary | ICD-10-CM

## 2023-06-16 DIAGNOSIS — E1122 Type 2 diabetes mellitus with diabetic chronic kidney disease: Secondary | ICD-10-CM

## 2023-06-16 DIAGNOSIS — I10 Essential (primary) hypertension: Secondary | ICD-10-CM

## 2023-06-16 DIAGNOSIS — M79621 Pain in right upper arm: Secondary | ICD-10-CM

## 2023-06-16 DIAGNOSIS — M25612 Stiffness of left shoulder, not elsewhere classified: Secondary | ICD-10-CM

## 2023-06-16 DIAGNOSIS — Z7985 Long-term (current) use of injectable non-insulin antidiabetic drugs: Secondary | ICD-10-CM

## 2023-06-16 DIAGNOSIS — Z6841 Body Mass Index (BMI) 40.0 and over, adult: Secondary | ICD-10-CM

## 2023-06-16 DIAGNOSIS — R2681 Unsteadiness on feet: Secondary | ICD-10-CM

## 2023-06-16 DIAGNOSIS — E66813 Obesity, class 3: Secondary | ICD-10-CM

## 2023-06-16 DIAGNOSIS — R29898 Other symptoms and signs involving the musculoskeletal system: Secondary | ICD-10-CM

## 2023-06-16 DIAGNOSIS — M25611 Stiffness of right shoulder, not elsewhere classified: Secondary | ICD-10-CM

## 2023-06-16 DIAGNOSIS — M79622 Pain in left upper arm: Secondary | ICD-10-CM

## 2023-06-16 DIAGNOSIS — E119 Type 2 diabetes mellitus without complications: Secondary | ICD-10-CM

## 2023-06-16 MED ORDER — ONDANSETRON HCL 4 MG PO TABS: 4.0000 mg | ORAL_TABLET | Freq: Three times a day (TID) | ORAL | 0 refills | Status: DC | PRN

## 2023-06-16 MED ORDER — VALSARTAN-HYDROCHLOROTHIAZIDE 80-12.5 MG PO TABS: 1.0000 | ORAL_TABLET | Freq: Every day | ORAL | 3 refills | Status: DC

## 2023-06-16 NOTE — Therapy (Signed)
 OUTPATIENT OCCUPATIONAL THERAPY NEURO Treatment  Patient Name: Raymond [name] MRN: [id number] DOB:August 04, 1965, 58 y.o., male Today's Date: 06/16/2023  PCP: Dr. Marsa MART PROVIDER: Dr. Lorilee  END OF SESSION:  OT End of Session - 06/16/23 0932     Visit Number 9    Number of Visits 32    Date for OT Re-Evaluation 08/24/23    Authorization Type cigna    Authorization Time Period 12 weeks    OT Start Time 0850    OT Stop Time 0935    OT Time Calculation (min) 45 min    Activity Tolerance Patient tolerated treatment well    Behavior During Therapy [facility] for tasks assessed/performed                  Past Medical History:  Diagnosis Date   Acute kidney injury superimposed on chronic kidney disease (HCC) 04/13/2023   Acute on chronic anemia 04/13/2023   Anemia    Anxiety    Back pain    COPD (chronic obstructive pulmonary disease) (HCC)    Depression    Diabetes mellitus without complication (HCC)    Diverticulosis    with hx of LGIB   Edema    GERD (gastroesophageal reflux disease)    GIB (gastrointestinal bleeding)    Gout    Heavy smoker    Hyperlipidemia    Hypertension    Iron deficiency anemia    Sleep apnea    uses a cpap   Swelling of lower extremity    Past Surgical History:  Procedure Laterality Date   CHONDROPLASTY Left 06/28/2014   Procedure: CHONDROPLASTY;  Surgeon: LELON JONETTA Shari Mickey., MD;  Location: [facility];  Service: Orthopedics;  Laterality: Left;   COLONOSCOPY     FOOT FASCIOTOMY     age 73-rt   KNEE ARTHROSCOPY WITH LATERAL MENISECTOMY Left 06/28/2014   Procedure: KNEE ARTHROSCOPY WITH LATERAL MENISECTOMY;  Surgeon: LELON JONETTA Shari Mickey., MD;  Location: [facility];  Service: Orthopedics;  Laterality: Left;   KNEE ARTHROSCOPY WITH MEDIAL MENISECTOMY Left 06/28/2014   Procedure: LEFT KNEE ARTHROSCOPY CHONDROPLASTY/WITH MEDIAL/LATERAL MENISECTOMIES;  Surgeon: LELON JONETTA Shari Mickey., MD;  Location: [facility];   Service: Orthopedics;  Laterality: Left;   ORIF RADIUS & ULNA FRACTURES  2007   left   THORACIC DISCECTOMY N/A 03/12/2023   Procedure: THORACIC LAMINECTOMY AND  DISCECTOMY;  Surgeon: Gillie Duncans, MD;  Location: [facility] OR;  Service: Neurosurgery;  Laterality: N/A;   Patient Active Problem List   Diagnosis Date Noted   Cystitis 05/21/2023   Acute diverticulitis 05/21/2023   Vitamin D  deficiency 05/14/2023   Low HDL (under 40) 05/14/2023   GERD (gastroesophageal reflux disease) 04/13/2023   Constipation 04/13/2023   Chronic back pain 04/13/2023   DVT, bilateral lower limbs (HCC) 04/13/2023   Coping style affecting medical condition 03/27/2023   Thoracic myelopathy with LE weakness 03/23/2023   HNP (herniated nucleus pulposus with myelopathy), thoracic 03/12/2023   Hyperlipidemia 12/24/2022   Failure to thrive  in adult 12/24/2022   Tobacco abuse 03/18/2018   BPH associated with nocturia 03/11/2017   History of substance abuse (HCC) 03/11/2017   Restless leg syndrome 03/11/2017   Restrictive lung disease 05/14/2016   OSA on CPAP 05/14/2016   Moderate COPD (chronic obstructive pulmonary disease) (HCC) 05/14/2016   Smoking greater than 40 pack years 05/14/2016   SOB (shortness of breath) 10/06/2014   Edema of extremities 10/06/2014   Benign essential HTN 10/06/2014  Type 2 diabetes mellitus, without long-term current use of insulin  (HCC) 07/18/2011    ONSET DATE: 04/08/23- referral date  REFERRING DIAG:  Diagnosis  M47.14 (ICD-10-CM) - Other spondylosis with myelopathy, thoracic region    THERAPY DIAG:  Muscle weakness (generalized) - Plan: Ot plan of care cert/re-cert,   Stiffness of right knee, not elsewhere classified - Plan: Ot plan of care cert/re-cert,   Other symptoms and signs involving the musculoskeletal system - Plan: Ot plan of care cert/re-cert,   Unsteadiness on feet - Plan: Ot plan of care cert/re-cert,   Other abnormalities of gait and mobility - Plan: Ot  plan of care cert/re-cert,   Pain in left upper arm - Plan: Ot plan of care cert/re-cert  Pain in right upper arm - Plan: Ot plan of care cert/re-cert  Stiffness of left shoulder, not elsewhere classified - Plan: Ot plan of care cert/re-cert  Stiffness of right shoulder, not elsewhere classified - Plan: Ot plan of care cert/re-cert  Rationale for Evaluation and Treatment: Rehabilitation  SUBJECTIVE:   SUBJECTIVE STATEMENT: Pt reports the ionto helped last time  Pt accompanied by: self  PERTINENT HISTORY: 58 y.o. male presenting with increased low back pain after a fall. Pt was found to have thoracic myelopathy due to a disc herniation at T10/11. He is now s/p T10 laminectomy and Discectomy. Pt received therapies at [facility] and he d/c home 03/23/23 PMH of anxiety, COPD, DM, Edema, Gout, HTN, HLD, L medial menisectomy, ORIF L radius and ulna.  PRECAUTIONS: Back  WEIGHT BEARING RESTRICTIONS: No  PAIN:  Are you having pain? Yes: NPRS scale: 7/10 Pain location: shoulder, biceps tendon bilateral UE's Pain description: aching Aggravating factors: sitting still Relieving factors: meds  FALLS: Has patient fallen in last 6 months? Yes. Number of falls 1  LIVING ENVIRONMENT: Lives with: lives with their spouse Lives in: House/apartment Stairs:  has ramp Has following equipment at home: Walker - 2 wheeled and bed side commode  PLOF: Independent with basic ADLs  PATIENT GOALS: increase I with ADLS/ADLS  OBJECTIVE:  Note: Objective measures were completed at Evaluation unless otherwise noted.  HAND DOMINANCE: Right  ADLs: Overall ADLs: increased time required, uses w/c, pt is not able to ambulate or stand for ADLs at this time Transfers/ambulation related to ADLs: Eating: mod I Grooming: mod I UB Dressing: setup LB Dressing: mod A,  Toileting: uses bedside commode supervision-min A for scooting Bathing: sponge bathing, min A per pt report Tub Shower transfers: n/a   Equipment: bed side commode Unable to get into bathroom with w/c  IADLs: dependent with IADLS   MOBILITY STATUS:  supervision to min A for transfers  -scooting from w/c, pt is unable to stand for ADLs  Activitiy tolerance: 45 mins to hour with rest break    UPPER EXTREMITY ROM:    Active ROM Right eval Left eval  Shoulder flexion 90 110  Shoulder abduction 90 100  Shoulder adduction    Shoulder extension    Shoulder internal rotation    Shoulder external rotation    Elbow flexion [facility] WFL  Elbow extension [facility] WFL  Wrist flexion    Wrist extension    Wrist ulnar deviation    Wrist radial deviation    Wrist pronation    Wrist supination  90%  (Blank rows = not tested)  UPPER EXTREMITY MMT:     MMT Right eval Left eval  Shoulder flexion 3+/5 4/5  Shoulder abduction    Shoulder adduction  Shoulder extension    Shoulder internal rotation    Shoulder external rotation    Middle trapezius    Lower trapezius    Elbow flexion 4/5 4+/5  Elbow extension 4//5 4+/5  Wrist flexion    Wrist extension    Wrist ulnar deviation    Wrist radial deviation    Wrist pronation    Wrist supination    (Blank rows = not tested)  HAND FUNCTION: Grip strength: Right: 72 lbs; Left: 75 lbs  COORDINATION: 9 Hole Peg test: Right: 23.21 sec; Left: 32.52 sec  SENSATION: WFL      COGNITION: Overall cognitive status: Within functional limits for tasks assessed    OBSERVATIONS: Pleasant male is highly motivated to improve   TODAY'S TREATMENT:     06/16/23  Ultrasound 1 mhz, 8.0 w/cm 2, 20%  x 8 mins to right shoulder and biceps, no adverse reactions.  Ultrasound  to left shoulder/ biceps , 1.0 w/cm 2, 20% x 8 mins for pain relief no adverse reactions.      Iontophoresis Patch Set-up and Applied.  No adverse reactions observed while in clinic.  Type: Desamethasone Location: bilateral shoulders Dose: 47mA/min, 1mL patch Time: 4hr patch Pt was moniored in the clinic x  5 mins s/p application. He was instructed to remove the patch in 4 hrs at 1:30.                                                                                                          06/04/23:    Ultrasound 1 mhz, 1.0 w/cm 2, 20%  x 8 mins to right shoulder and biceps, no adverse reactions.  Ultrasound  to left shoulder, , 1.0 w/cm 2, 20% x 8 mins for pain relief no adverse reactions.   Closed-chain shoulder flex with BUEs in pain-free range with min cueing for positioning/sholder hike  Iontophoresis Patch Set-up and Applied.  No adverse reactions observed while in clinic.  Pt/wife instructed in Ionto instructions/precautions and contraindications Type: Desamethasone Location: bilateral shoulders Dose: 82mA/min, 1mL patch Time: 4hr patch    06/01/23 Therapist checked progress towards goals as pt was recently hospitalize for diverticulitis.  Pt demonstrates bilateral shoulder flexion to 70* prior to significant pain. He reports increased bilateral shoulder pain and he has been cautioned to limit repetative use of bilateral UE's and to avoid biceps curls. US  1 mhz, 1.0 w/cm 2, 20%  x 8 mins to right shoulder and biceps, no adverse reactions. Ice pack applied to R shoulder x 7 mins while pt recived UE to left shoulder. No adverse reactions. Pt reports he is nearly pain free afterwards. US  to left shoulder, , 1.0 w/cm 2, 20% x 7 mins for pain relief no adverse reactions. Therapist discussed use of Iontophoreis with pt and he is agreeable. Therapist to request order.  05/20/23- US :1- mhz,  1.0 w/cm 2, 50%, x 10 mins  for L biceps tendon and biceps and left lateral/ anterior shoulder and left shoulder pain, no adverse reactions  Ice pack applied to left shoulder x 8 min  Pt reports pain was improved to 2-3 /10 at end of session  05/14/23-UBE x 6 mins level 3 for conditioning Pt reports difficulty with bathing his bottom and hygine. Therapist discussed with pt/ wife and provided  information regarding toilet aide, handheld bidet and long handled sponge. therapist recommends pt does as much as he can then has assistnace from his wife for thoroughness. Therapist also discussed with pt/ wife activies pt can perform at home at w/c level: folding clothes, wiping kitchen counter, and retieveing a snack for the fridge. Closed chain shoulder flexion with cane, to 90*, 20 reps min v.c Biceps curls and triceps extension 15 reps each, min v.c  05/11/23-UBE x 6 mins level 2 for conditioning Biceps curls 2 sets of 10-15 reps with 2 lbs hand weights, triceps ext with red band, min v.c 15 reps each UE Closed chain shoulder flexion with cane 15 reps to shoulder height, min v.c to avoid shoulder hike, then 15 reps with 2 lbs cane  Pt practiced scooting along edge of mat with emphasis on lifting up with arms and legs before scoot, min v.c Pt simulated donning pants with reacher and min v.c and demonstration. Pt transferred back to w/c via sliding board with supervision min-mod v.c  05/07/23 UBE x 6 mins level 3 for conditioning Pt was instructed in updates to HEP, for bilateral UE's min v.c and demonstration 10 reps each, red band and cane were used see pt instuctions.  05/06/23- UBE x 6 mins level 2 for conditioning, seated in w/c Pt transferred scoot pivot without sliding board to mat with supervision Seated on mat closed chain shoulder flexion and biceps curls 10 reps each, min v.c  Biceps curls x 20 reps with 3lbs weight bilateral UE's, min v.c shoulder flexion bilateal UE's with 2 lbs weight in each hand, 15 reps. Pt practiced scooting on mat with cueing to shift weight fowrard and pushdown through hands to scoot, pt also practiced using this technique for a sliding board transfer back to w/c. min v.c Pt's wife rports pt an't go into the kitchen because he can't remove his leg rests. Pt practiced removing and reattaching w/c leg rests with min v.c the pt practiced walking his w/c  forwards with legs while assisting aby pushing wheels. see education  04/22/23 eval  PATIENT EDUCATION: Education details:    Iontophoresis Instructions, Precautions/Contraindications. Person educated: Patient  Education method: Explanation, Verbal cues, and Handouts Education comprehension: verbalized understanding,  HOME EXERCISE PROGRAM:  red band and vertical cane 05/07/23   GOALS: Goals reviewed with patient? Yes  SHORT TERM GOALS: Target date: 07/01/22  I with inital HEP.  Goal status: ongoing, 06/01/23  2.  Pt will peform LB dressing with min A  Goal status: ongoing, mod A 06/01/23  3.  Pt will consistently perform toilet transfers with supervision.  Goal status: ongoing, supervision with transfer, min A hygeine  4.  Pt will perform snack or beverage prep at a w/c level  Goal status: ongoing, dependent 06/01/23  5. Pt will verbalize understanding of back precautions.  Goal status: ongoing, 06/01/23, reveiwed BAT, however pt needs reinforcement    LONG TERM GOALS: Target date: 08/24/22  I with updated HEP. Baseline:  Goal status:  ongoing  2.  Pt will perform bathing with supervision/ set up  Goal status:  ongoing  3.  Pt will perform LB dressing with supervision/ set up.  Goal status: I ongoing  4.  Pt will demonstrate ability with stand for 5  mins with min A for ADLs/ IADLs.  Goal status:  ongoing  5.  Pt will perfrom simple home management at a walker level with min A.  Goal status:  ongoing   ASSESSMENT:    CLINICAL IMPRESSION: Pt is progressing towards goals. He remains limited by bilateral UE pain however he reports pain is decresaed following US .   PERFORMANCE DEFICITS: in functional skills including ADLs, IADLs, ROM, strength, pain, flexibility, mobility, balance, body mechanics, endurance, decreased knowledge of precautions, decreased knowledge of use of DME, and UE functional use,  and psychosocial skills including coping strategies,  environmental adaptation, habits, interpersonal interactions, and routines and behaviors.   IMPAIRMENTS: are limiting patient from ADLs, IADLs, rest and sleep, work, play, leisure, and social participation.   CO-MORBIDITIES: Tritch have co-morbidities  that affects occupational performance. Patient will benefit from skilled OT to address above impairments and improve overall function.  MODIFICATION OR ASSISTANCE TO COMPLETE EVALUATION: No modification of tasks or assist necessary to complete an evaluation.  OT OCCUPATIONAL PROFILE AND HISTORY: Detailed assessment: Review of records and additional review of physical, cognitive, psychosocial history related to current functional performance.  CLINICAL DECISION MAKING: LOW - limited treatment options, no task modification necessary  REHAB POTENTIAL: Good  EVALUATION COMPLEXITY: Low    PLAN:  OT FREQUENCY: 2x/week  OT DURATION: 12 weeks plus eval  PLANNED INTERVENTIONS: 97168 OT Re-evaluation, 97535 self care/ADL training, 02889 therapeutic exercise, 97530 therapeutic activity, 97112 neuromuscular re-education, 97140 manual therapy, 97116 gait training, 02886 aquatic therapy, 97035 ultrasound, 97018 paraffin, 02989 moist heat, 97010 cryotherapy, passive range of motion, balance training, stair training, functional mobility training, psychosocial skills training, energy conservation, coping strategies training, patient/family education, and DME and/or AE instructions  RECOMMENDED OTHER SERVICES: PT  CONSULTED AND AGREED WITH PLAN OF CARE: Patient  PLAN FOR NEXT SESSION:   US , iontophoresis,activity modification to minimize shoulder pain   Heaven Meeker, OTR/L 06/16/2023, 12:13 PM

## 2023-06-16 NOTE — Progress Notes (Signed)
 WEIGHT SUMMARY AND BIOMETRICS  Weight Lost Since Last Visit: 4lb  Weight Gained Since Last Visit: 0   Vitals Temp: (!) 97.3 F (36.3 C) BP: (!) 179/100 Pulse Rate: 70 SpO2: 99 %   Anthropometric Measurements Height: 5' 11 (1.803 m) Weight: (!) 322 lb (146.1 kg) BMI (Calculated): 44.93 Weight at Last Visit: 326lb Weight Lost Since Last Visit: 4lb Weight Gained Since Last Visit: 0 Starting Weight: 326lb Total Weight Loss (lbs): 4 lb (1.814 kg)   No data recorded Other Clinical Data Fasting: no Labs: no Today's Visit #: 3 Starting Date: 05/13/23    OBESITY Raymond [name] is here to discuss his progress with his obesity treatment plan along with follow-up of his obesity related diagnoses.    Nutrition Plan: the Category 4 plan - 50% adherence.  Current exercise:  chair exercises.  Interim History:  He is down 4 lbs since his first visit.  Eating all of the food on the plan., Protein intake is as prescribed, Is not skipping meals, Water intake is adequate., and Denies polyphagia   Pharmacotherapy: Raymond [name] is on Mounjaro  10 mg SQ weekly Adverse side effects: Nausea Hunger is moderately controlled.  Cravings are moderately controlled.  Assessment/Plan:   Hypertension Hypertension poorly controlled.  His blood pressure has been consistently high at his visits here.  He states that he is taking his blood pressure medication as directed. Medication(s): Coreg  25 mg twice daily  Cardura   2 mg daily.   BP Readings from Last 3 Encounters:  06/16/23 (!) 179/100  05/26/23 (!) 157/87  05/25/23 131/70   Lab Results  Component Value Date   CREATININE 1.87 (H) 05/25/2023   CREATININE 1.85 (H) 05/24/2023   CREATININE 1.59 (H) 05/23/2023   Lab Results  Component Value Date   GFR 92.19 10/05/2014    Plan: Rx: Diovan -HCT 80-12.5 mg 1 daily # 30 with no refills.   Continue all antihypertensives at current dosages. No added salt. Will keep sodium content to 1,500 mg or less per day.    Type II Diabetes HgbA1c is at goal. Last A1c was 6.0 CBGs: Not checking      Episodes of hypoglycemia: no Medication(s): Mounjaro  10 mg SQ weekly  Lab Results  Component Value Date   HGBA1C 6.0 (H) 03/12/2023   HGBA1C 6.3 (H) 12/25/2022   Lab Results  Component Value Date   LDLCALC 77 05/13/2023   CREATININE 1.87 (H) 05/25/2023   Lab Results  Component Value Date   GFR 92.19 10/05/2014    Plan: Continue Mounjaro  10 mg SQ weekly Continue all other medications.  Will keep all carbohydrates low both sweets and starches.  Will continue exercise regimen to 30 to 60 minutes on most days of the week.  Aim for 7 to 9 hours of sleep nightly.  Eat more low glycemic index foods.  Will have a protein shake in  the morning if he does not eat his breakfast.     Morbid Obesity: Current BMI BMI (Calculated): 44.93   Pharmacotherapy Plan Continue  Mounjaro  10 mg SQ weekly  Raymond [name] is currently in the action stage of change. As such, his goal is to continue with weight loss efforts.  He has agreed to the Category 4 plan.  Exercise goals: Older adults should follow the adult guidelines. When older adults cannot meet the adult guidelines, they should be as physically active as their abilities and conditions will allow.  He is going to PT for his shoulders.   Behavioral modification strategies: increasing lean protein intake, meal planning , increase water intake, better snacking choices, planning for success, increasing fiber rich foods, decrease junk food, avoiding temptations, keep healthy foods in the home, weigh protein portions, and mindful eating.  Raymond [name] has agreed to follow-up with our clinic in 2 weeks.      Objective:   VITALS: Per patient if applicable, see vitals. GENERAL: Alert and in no acute distress. CARDIOPULMONARY: No increased WOB. Speaking in  clear sentences.  PSYCH: Pleasant and cooperative. Speech normal rate and rhythm. Affect is appropriate. Insight and judgement are appropriate. Attention is focused, linear, and appropriate.  NEURO: Oriented as arrived to appointment on time with no prompting.   Attestation Statements:   This was prepared with the assistance of Engineer, Civil (consulting).  Occasional wrong-word or sound-a-like substitutions Raymond [name] have occurred due to the inherent limitations of voice recognition   Clayborne Daring, DO

## 2023-06-17 ENCOUNTER — Ambulatory Visit: Payer: PRIVATE HEALTH INSURANCE | Admitting: Physical Therapy

## 2023-06-17 ENCOUNTER — Ambulatory Visit: Payer: PRIVATE HEALTH INSURANCE | Admitting: Occupational Therapy

## 2023-06-17 ENCOUNTER — Encounter: Payer: Self-pay | Admitting: Physical Therapy

## 2023-06-17 DIAGNOSIS — R29898 Other symptoms and signs involving the musculoskeletal system: Secondary | ICD-10-CM

## 2023-06-17 DIAGNOSIS — R2681 Unsteadiness on feet: Secondary | ICD-10-CM

## 2023-06-17 DIAGNOSIS — M25612 Stiffness of left shoulder, not elsewhere classified: Secondary | ICD-10-CM

## 2023-06-17 DIAGNOSIS — M25611 Stiffness of right shoulder, not elsewhere classified: Secondary | ICD-10-CM

## 2023-06-17 DIAGNOSIS — R2689 Other abnormalities of gait and mobility: Secondary | ICD-10-CM

## 2023-06-17 DIAGNOSIS — G8929 Other chronic pain: Secondary | ICD-10-CM

## 2023-06-17 DIAGNOSIS — M25662 Stiffness of left knee, not elsewhere classified: Secondary | ICD-10-CM

## 2023-06-17 DIAGNOSIS — M6281 Muscle weakness (generalized): Secondary | ICD-10-CM

## 2023-06-17 DIAGNOSIS — M79621 Pain in right upper arm: Secondary | ICD-10-CM

## 2023-06-17 DIAGNOSIS — M79622 Pain in left upper arm: Secondary | ICD-10-CM

## 2023-06-17 DIAGNOSIS — M25661 Stiffness of right knee, not elsewhere classified: Secondary | ICD-10-CM

## 2023-06-17 NOTE — Therapy (Signed)
 OUTPATIENT PHYSICAL THERAPY CERVICAL/THORACIC TREATMENT   Progress Note Reporting Period 04/21/23 to 06/15/23  See note below for Objective Data and Assessment of Progress/Goals.     Patient Name: Raymond [name] MRN: [id number] DOB:01-Feb-1966, 58 y.o., male Today's Date: 06/17/2023  END OF SESSION:  PT End of Session - 06/17/23 1439     Visit Number 11    Date for PT Re-Evaluation 07/27/23    PT Start Time 1315    PT Stop Time 1355    PT Time Calculation (min) 40 min    Equipment Utilized During Treatment Gait belt    Activity Tolerance Patient tolerated treatment well;Patient limited by pain    Behavior During Therapy WFL for tasks assessed/performed             Past Medical History:  Diagnosis Date   Acute kidney injury superimposed on chronic kidney disease (HCC) 04/13/2023   Acute on chronic anemia 04/13/2023   Anemia    Anxiety    Back pain    COPD (chronic obstructive pulmonary disease) (HCC)    Depression    Diabetes mellitus without complication (HCC)    Diverticulosis    with hx of LGIB   Edema    GERD (gastroesophageal reflux disease)    GIB (gastrointestinal bleeding)    Gout    Heavy smoker    Hyperlipidemia    Hypertension    Iron deficiency anemia    Sleep apnea    uses a cpap   Swelling of lower extremity    Past Surgical History:  Procedure Laterality Date   CHONDROPLASTY Left 06/28/2014   Procedure: CHONDROPLASTY;  Surgeon: Forbes Ida., MD;  Location: [facility];  Service: Orthopedics;  Laterality: Left;   COLONOSCOPY     FOOT FASCIOTOMY     age 22-rt   KNEE ARTHROSCOPY WITH LATERAL MENISECTOMY Left 06/28/2014   Procedure: KNEE ARTHROSCOPY WITH LATERAL MENISECTOMY;  Surgeon: Forbes Ida., MD;  Location: [facility];  Service: Orthopedics;  Laterality: Left;   KNEE ARTHROSCOPY WITH MEDIAL MENISECTOMY Left 06/28/2014   Procedure: LEFT KNEE ARTHROSCOPY CHONDROPLASTY/WITH MEDIAL/LATERAL MENISECTOMIES;  Surgeon:  Forbes Ida., MD;  Location: [facility];  Service: Orthopedics;  Laterality: Left;   ORIF RADIUS & ULNA FRACTURES  2007   left   THORACIC DISCECTOMY N/A 03/12/2023   Procedure: THORACIC LAMINECTOMY AND  DISCECTOMY;  Surgeon: Audie Bleacher, MD;  Location: [facility];  Service: Neurosurgery;  Laterality: N/A;   Patient Active Problem List   Diagnosis Date Noted   Cystitis 05/21/2023   Acute diverticulitis 05/21/2023   Vitamin D  deficiency 05/14/2023   Low HDL (under 40) 05/14/2023   GERD (gastroesophageal reflux disease) 04/13/2023   Constipation 04/13/2023   Chronic back pain 04/13/2023   DVT, bilateral lower limbs (HCC) 04/13/2023   Coping style affecting medical condition 03/27/2023   Thoracic myelopathy with LE weakness 03/23/2023   HNP (herniated nucleus pulposus with myelopathy), thoracic 03/12/2023   Hyperlipidemia 12/24/2022   Failure to thrive  in adult 12/24/2022   Tobacco abuse 03/18/2018   BPH associated with nocturia 03/11/2017   History of substance abuse (HCC) 03/11/2017   Restless leg syndrome 03/11/2017   Restrictive lung disease 05/14/2016   OSA on CPAP 05/14/2016   Moderate COPD (chronic obstructive pulmonary disease) (HCC) 05/14/2016   Smoking greater than 40 pack years 05/14/2016   SOB (shortness of breath) 10/06/2014   Edema of extremities 10/06/2014   Benign essential HTN  10/06/2014   Type 2 diabetes mellitus, without long-term current use of insulin  (HCC) 07/18/2011    PCP: Arlon Bergamo, MD  REFERRING PROVIDER:  Sterling Eisenmenger, PA-C   REFERRING DIAG: [phone] (ICD-10-CM) - Other spondylosis with myelopathy, thoracic region   THERAPY DIAG:  Muscle weakness (generalized)  Unsteadiness on feet  Other symptoms and signs involving the musculoskeletal system  Stiffness of right knee, not elsewhere classified  Chronic pain of right knee  Stiffness of left knee, not elsewhere classified  Chronic pain of left  knee  Rationale for Evaluation and Treatment: Rehabilitation  ONSET DATE: October 2024  SUBJECTIVE:                                                                                                                                                                                                         SUBJECTIVE STATEMENT: Pt reports he is scheduled for an MRI 2/19 to assess his back surgery. His Baclofen  was increased, but is not much help.  From eval: Pt states he fell, broke his ankle and was found to later have a ruptured disc and had to get back surgery. Went to inpatient rehab x 3 weeks. Felt like he built up his upper body. Has now been home x 1 week. States he has not been doing much since then -- has done some sitting exercises. Was able to stand for 3 min bouts for a total of about 12 min using RW at inpatient rehab. Has history of bad knees and will be getting gel shots. 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 He was found to have large HNP T10/T11 and underwent T10 laminectomy with discectomy by Dr. Michale Age on 03/12/2023   PAIN:  Are you having pain? No  PRECAUTIONS: None  RED FLAGS: None     WEIGHT BEARING RESTRICTIONS: No  FALLS:  Has patient fallen in last 6 months? Yes. Number of falls 1 -- last fall Oct 2024  LIVING ENVIRONMENT: Lives with: lives with their spouse Lives in: House/apartment Stairs:  ramp Has following equipment at home: Environmental consultant - 2 wheeled, Wheelchair (manual), and upright walker, hospital bed  OCCUPATION: Retired  PLOF:  Able to do his own ADLs but easier to have wife assist him  PATIENT GOALS: Walk with the walker at least household  distances  NEXT MD VISIT:  04/21/23 [place]  OBJECTIVE:  Note: Objective measures were completed at Evaluation unless otherwise noted.  DIAGNOSTIC FINDINGS:  03/12/23 MRI THORACIC SPINE IMPRESSION:   1. Moderate-sized lobulated left subarticular to foraminal disc protrusion at T10-11 with resultant severe spinal stenosis. Cord is compressed and deviated to the right at this level. Associated cord signal changes concerning for edema and/or myelomalacia. 2. No other acute abnormality within the thoracic spine. 3. Degenerative spondylosis at T8-9 through T11-12 without significant spinal stenosis. Associated moderate to severe bilateral foraminal narrowing at these levels as above.  MRI LUMBAR SPINE IMPRESSION:   1. No acute abnormality within the lumbar spine. 2. Multifactorial degenerative changes at L4-5 with resultant mild canal with severe left and moderate right lateral recess stenosis, with severe bilateral L4 foraminal narrowing. 3. Additional mild noncompressive disc bulging and facet hypertrophy elsewhere within the lumbar spine as above. No other significant stenosis or frank neural impingement.   Critical Value/emergent results were called by telephone at the time of interpretation on 03/12/2023 at 12:41 am to provider Dr. Monique Ano, who verbally acknowledged these results.  PATIENT SURVEYS:  FOTO 42; predicted 77  COGNITION: Overall cognitive status: Within functional limits for tasks assessed  SENSATION: WFL  POSTURE: rounded shoulders and forward head  PALPATION: No overt tenderness to palpation   CERVICAL ROM: WNL  LOWER EXTREMITY MMT:    MMT Right eval Left eval  Hip flexion 3+ 3  Hip extension 3+ 3+  Hip abduction 3+ 3+  Hip adduction    Hip internal rotation    Hip external rotation    Knee flexion 4- 3+  Knee extension 4- 3+  Ankle dorsiflexion    Ankle plantarflexion    Ankle inversion    Ankle eversion     (Blank rows = not tested)   FUNCTIONAL TESTS:   Transfers:  Chair to bed: Multiple seated scoots with UEs  Sit<>stand: max A -- limited due to knee pain Supine to sit<>sit: min A for trunk and LE negotiation. Not performing log rolling  TODAY'S TREATMENT:                                                                                                                              DATE:  06/17/23 NuStep L5 x 8 minutes, including multiple rest breaks due to SOB, U and LE. Performed multiple transfers including up grade and down grade with sliding board and S, after set up. Educated how to position the sliding board so it does not bend the brake lever. Attempted sit to stand from elevated mat using RW. He was able to rise off the mat and come to nearly full stand, but unalbe to achieve full upright posture due to BLE fatigue and R knee pain.  06/15/23 NuStep- sliding board transfer up grade, CGA NuStep L3 x 5 minutes, strap around knees to hold better alignment. First with UE support, then legs only. Painful for  knees. Status re-assessed for PN. See goals Standing in parallel bars- Able to achieve standing using BUE to push up. He die rely on the UE support in stance, attempted to facilitate decreased UE reliance. Able to reduce the use of UE slightly, but very unstable at knees.  06/08/23 Sliding board transfer up minimal grade to mat, S after A with board placement. Sit to supine, S Patient's legs began spasming, and he reported pain and poor sensory awareness of LEs in supine. Elevated legs on physioball, with helped. Supine gentle DKTC, bridge, hip IR with 5 sec hold and bridge with ball roll, x 10 each to patient tolerance. Rolled to L side and attempted hip abd, but his spasms returned and were painful. Sup to sit with min A. He reported continued diminished sensory feedback.  06/01/23 New W/C adjustment- armrests raised to better support his elbows at rest Sliding board transfer to mat, min A for board placement, then S, improved  scooting, still required many scoots. Sit to supine with S Repeated rolling to each side lifting the leg to cross body rather than push on mat, 5 x each way. Very challenging, but complete. Sup to sit with S. Sit to stand in parallel bars. First attempt, unable to stand, second and third attempts, reliant on UE support, but able to achieve full stance. X 10 sec. Mod A for standing  05/20/23 Assessed L shoulder due to increased pain. Negative full can, belly press, resisted ER, positive empty can, Hawkin's. TTP mildly over L supraspinatus muscle and tendon, biceps tendon. Sliding board transfer to mat with S, VC to push with legs to assist. Seated L shoulder distraction holding the edge of mat and leaning to R. Seated press ups for scapular stability. Moved sit to supine with S. His feet and ankles appear more swollen today. He attemtped to push into physioball to activate LE. However, his legs kept spasming, so this was deferred. Attempted sit to stand from elevated mat, but he was unable to raise hips today.  05/14/23 Transfer to mat, Challenging for level transfer, but I Sit to Supine Mi, improved ability to lift legs to surface Supine with legs over G physioball- bridge, DKTC, quick side to side rotations, alternating lifting one leg off ball to mat and back up, 10 reps each. Supine to long sit, then scoot hips forward to the edge of mat, very challenging, but able to accomplish with VC from therapist for correct technique, lean and scoot. Fitter press, 1 x 10 with 1 blue band, then 1 x 5 with 2 blue bands, focus Standing in parallel bars from elevated W/C seat. Pushes up from bars with min A and extra time, still largely reliant on UE support, but trying to move and relax arms to rely on his legs more. Stood 3 x 30 sec, CGA once up with close guarding at knees in case of buckling.  05/11/23 W/C > mat scoot transfer with S. Difficulty getting started, but completed without physical  A. Moved into longsit on mat, then scooted over and backwards. Performed multiple movements on the mat in order to facilitate active trunk stability, including prone lying, attempted to move to quadruped, but his knees kept slipping back as he tried to push up. Rolled to R and performed L hip abd, repeated on L sidelie for R hip. Sup to sit on edge of mat with much less effort required today, S Sit<> partial stand from elevated mat with hands on chair in front of  him, able to come to stand with hands on seat, then place hands on the armrests of the chair. 3 reps. Sit to stand with mod A to initiate and given extra time to rise. Parallel bars- Sit to stand from W/C with Airex pad in seat to elevate slightly. Pushed up on parallel bars. Able to achieve fully upright stance and perform lat weight shifts, but had difficulty once he tried to lift either foot to simulate taking a step, poor control of limb in space.  05/07/23 Sliding board transfer to mat using lean technique with shoulders away from hips to initiate. He then was able to lift his hips and scoot the rest of the way. Supine bridging x 10, able to clear hips Repeated roll to each side x 3 using trunk into flexion. Fitter press with 2 blue bands x 10 each leg, very challenging. Functional trunk and LE strengthening, seated on mat, turn to R, walking both hands away from trunk, then lifting L LE to touch heel to surface of mat. X 5 lifts to each side, very challenging. Sit<> partial stand from elevated mat with hands on chair in front of him, able to come to stand with hands on seat, then place hands on the armrests of the chair. Moved to parallel bars with Air ex pad on top of his W/C cushion to elevate him. Sit > Stand pulling bars, with mod A and time to push into full upright posture where he stood x approx 30 seconds. Relies on arms to A.  05/06/23 Lateral transfers from W/C <> mat with min A. Sit to supine with light min A to BLE Rolled  to L- Therapist facilitated lower trunk active mobilization in ant/post and inf/sup planes of movement. Moved to R LE with acitve hip abd, very weak, tends to turn and use quads, clamshell also required mod A. Facilitated repeated rolling using normalized technique with RLE and UE held in the air to engage trunk. Very challenging, but able to complete x 4. Repeated all activities in R side lying. Moved from Sup > long sit I, but did report some strain in back Sit <> partial stand from elevated mat, hands on the seat or armrests of a chair facing him, required +2 to initiate due to B knee pain, but he was able to rise into bent over standing position x5 and hold for up to 5 seconds. B scoot transfers, emphasizing lifting hips off the surface by pushing through legs, x 5 each way on mat Transfer back to W/C with CGA. Mat > W/C with CGA  04/23/23 Seated heel slide 2x10 Transfer x2; min A to stabilize w/c while getting to bed. Pt utilizing multiple scoots and UEs SLR short range x10 (>10 deg extensor lag bilat) Quad set 2x10 SAQ 2# ankle weight 2x10 Supine clamshell green TB 2x10 Bridge 2x10 Supine to sit log roll with SBA for v/cs In // bars pulling up for 1/4 to standing 2x5  In w/c pushing with legs only 2x10' In w/c pulling with legs only 2x10'  04/21/23 See HEP below   PATIENT EDUCATION:  Education details: Exam findings, POC, initial HEP Person educated: Patient Education method: Explanation, Demonstration, and Handouts Education comprehension: verbalized understanding, returned demonstration, and needs further education  HOME EXERCISE PROGRAM: Access Code: [email or web address] URL: [email or web address] Date: 04/21/2023 Prepared by: Gellen April Marie Nonato  Exercises - Staggered Bridge  - 1 x daily - 7 x weekly - 2 sets - 10 reps - Hooklying Isometric  Clamshell  - 1 x daily - 7 x weekly - 2 sets - 10 reps - Small Range Straight Leg Raise  - 1 x daily - 7 x weekly - 2  sets - 10 reps  ASSESSMENT:  CLINICAL IMPRESSION: Patient's L brake on W/C out of position, such that it could not lock the wheel from spinning. Brake adjusted and patient educated to how to complete this himself if it happens again. Also educated on how to set the lock so that he does not push out on the handle. Continued with LE strengthening activities, tolerated increased resistance and time on NuStep, but it seemed to fatigue his legs.  From eval: Patient is a 58 y.o. M who was seen today for physical therapy evaluation and treatment for thoracic myelopathy. PMH significant for bilat knee OA (pt to get gel shots and is interested in future TKA once he gets his weight down) and T10 laminectomy with discectomy by Dr. Michale Age on 03/12/2023. Went to inpatient rehab x 3 weeks. Has now been at home x 1 week. Pt states he does not currently have any known precautions. Assessment significant for gross bilat LE weakness (L LE weaker than R) affecting standing tolerance, transfers and balance. Pt is limited due to his knee pain. Pt will highly benefit from PT to address these deficits to reach pt's goals for increased mobility and amb.   OBJECTIVE IMPAIRMENTS: Abnormal gait, decreased balance, decreased endurance, decreased mobility, difficulty walking, decreased ROM, decreased strength, increased edema, improper body mechanics, postural dysfunction, obesity, and pain.   GOALS: Goals reviewed with patient? Yes  SHORT TERM GOALS: Target date: 04/15/24   Pt will be ind with initial HEP Baseline:  Goal status: 05/06/23- provided, but inconsistent in performing. Ongoing  2.  Pt will be able to tolerate standing x 1 min in the parallel bars with BUE support and CGA Baseline: 06/01/23- Goal updated after recent hospitalization Goal status: 06/15/23- Stands x up to 30 sec with BUE support in parallel bars, Improved ability to slide hands with slightly less reliance on UE support.ongoing  3.  Pt will be  able to perform sliding board transfer with S, including set up, and up a slight (3") grade Baseline: 06/01/23- Goal updated after recent hospitalization Goal status: 06/15/23- Min A for set up, S for level transfer, required mult scoots, improved excursion with shifts.   LONG TERM GOALS: Target date: 07/14/2023   Pt will be ind with management and progression of HEP Baseline:  Goal status: INITIAL  2.  Pt will be able to amb at least 20' with RW min A for home mobility Baseline:  Goal status: 06/15/23- Stands x up to 30 sec with BUE support in parallel bars, Improved ability to slide hands with slightly less reliance on UE support.ongoing  3.  Pt will be able to perform at least 2 reps of sit to stand in 30 sec with UE support Baseline: Unable to perform complete STS without mod A Goal status: 06/15/23-Unable to stand without UE support, ongoing  4.  Pt will have increased FOTO to >/=54 Baseline:  Goal status: 06/15/23-26.8, ongoing  PLAN:  PT FREQUENCY: 2x/week  PT DURATION: 12 weeks  PLANNED INTERVENTIONS: 97164- PT Re-evaluation, 97110-Therapeutic exercises, 97530- Therapeutic activity, 97112- Neuromuscular re-education, 97535- Self Care, 16109- Manual therapy, [phone]- Gait training, [phone]- Orthotic Fit/training, [phone]- Aquatic Therapy, [phone]- Electrical stimulation (unattended), [id number]- Ultrasound, 13086- Ionotophoresis 4mg /ml Dexamethasone , Patient/Family education, Balance training, Stair training, Taping, Dry Needling, Joint mobilization, Spinal mobilization,  Cryotherapy, and Moist heat  PLAN FOR NEXT SESSION: Assess response to HEP. Continue to work on LE strengthening and transfers/standing.    Cyrus Drone, DPT 06/17/2023, 2:40 PM

## 2023-06-17 NOTE — Therapy (Signed)
 OUTPATIENT OCCUPATIONAL THERAPY NEURO Treatment  Patient Name: Raymond [name] MRN: [id number] DOB:September 24, 1965, 58 y.o., male Today's Date: 06/17/2023  PCP: Dr. Victorio Grave PROVIDER: Dr. Alessandra Ancona  END OF SESSION:  OT End of Session - 06/17/23 1417     Visit Number 10    Number of Visits 32    Date for OT Re-Evaluation 08/24/23    Authorization Type cigna    Authorization Time Period 12 weeks    OT Start Time 1402    OT Stop Time 1442    OT Time Calculation (min) 40 min    Activity Tolerance Patient tolerated treatment well    Behavior During Therapy WFL for tasks assessed/performed                  Past Medical History:  Diagnosis Date   Acute kidney injury superimposed on chronic kidney disease (HCC) 04/13/2023   Acute on chronic anemia 04/13/2023   Anemia    Anxiety    Back pain    COPD (chronic obstructive pulmonary disease) (HCC)    Depression    Diabetes mellitus without complication (HCC)    Diverticulosis    with hx of LGIB   Edema    GERD (gastroesophageal reflux disease)    GIB (gastrointestinal bleeding)    Gout    Heavy smoker    Hyperlipidemia    Hypertension    Iron deficiency anemia    Sleep apnea    uses a cpap   Swelling of lower extremity    Past Surgical History:  Procedure Laterality Date   CHONDROPLASTY Left 06/28/2014   Procedure: CHONDROPLASTY;  Surgeon: Forbes Ida., MD;  Location: [facility];  Service: Orthopedics;  Laterality: Left;   COLONOSCOPY     FOOT FASCIOTOMY     age 89-rt   KNEE ARTHROSCOPY WITH LATERAL MENISECTOMY Left 06/28/2014   Procedure: KNEE ARTHROSCOPY WITH LATERAL MENISECTOMY;  Surgeon: Forbes Ida., MD;  Location: [facility];  Service: Orthopedics;  Laterality: Left;   KNEE ARTHROSCOPY WITH MEDIAL MENISECTOMY Left 06/28/2014   Procedure: LEFT KNEE ARTHROSCOPY CHONDROPLASTY/WITH MEDIAL/LATERAL MENISECTOMIES;  Surgeon: Forbes Ida., MD;  Location:  SUR[facility]ERY  [facility];  Service: Orthopedics;  Laterality: Left;   ORIF RADIUS & ULNA FRACTURES  2007   left   THORACIC DISCECTOMY N/A 03/12/2023   Procedure: THORACIC LAMINECTOMY AND  DISCECTOMY;  Surgeon: Audie Bleacher, MD;  Location: [facility];  Service: Neurosurgery;  Laterality: N/A;   Patient Active Problem List   Diagnosis Date Noted   Cystitis 05/21/2023   Acute diverticulitis 05/21/2023   Vitamin D  deficiency 05/14/2023   Low HDL (under 40) 05/14/2023   GERD (gastroesophageal reflux disease) 04/13/2023   Constipation 04/13/2023   Chronic back pain 04/13/2023   DVT, bilateral lower limbs (HCC) 04/13/2023   Coping style affecting medical condition 03/27/2023   Thoracic myelopathy with LE weakness 03/23/2023   HNP (herniated nucleus pulposus with myelopathy), thoracic 03/12/2023   Hyperlipidemia 12/24/2022   Failure to thrive  in adult 12/24/2022   Tobacco abuse 03/18/2018   BPH associated with nocturia 03/11/2017   History of substance abuse (HCC) 03/11/2017   Restless leg syndrome 03/11/2017   Restrictive lung disease 05/14/2016   OSA on CPAP 05/14/2016   Moderate COPD (chronic obstructive pulmonary disease) (HCC) 05/14/2016   Smoking greater than 40 pack years 05/14/2016   SOB (shortness of breath) 10/06/2014   Edema of extremities 10/06/2014   Benign essential HTN 10/06/2014  Type 2 diabetes mellitus, without long-term current use of insulin  (HCC) 07/18/2011    ONSET DATE: 04/08/23- referral date  REFERRING DIAG:  Diagnosis  M47.14 (ICD-10-CM) - Other spondylosis with myelopathy, thoracic region    THERAPY DIAG:  Muscle weakness (generalized) - Plan: Ot plan of care cert/re-cert,   Stiffness of right knee, not elsewhere classified - Plan: Ot plan of care cert/re-cert,   Other symptoms and signs involving the musculoskeletal system - Plan: Ot plan of care cert/re-cert,   Unsteadiness on feet - Plan: Ot plan of care cert/re-cert,   Other abnormalities of gait and mobility -  Plan: Ot plan of care cert/re-cert,   Pain in left upper arm - Plan: Ot plan of care cert/re-cert  Pain in right upper arm - Plan: Ot plan of care cert/re-cert  Stiffness of left shoulder, not elsewhere classified - Plan: Ot plan of care cert/re-cert  Stiffness of right shoulder, not elsewhere classified - Plan: Ot plan of care cert/re-cert  Rationale for Evaluation and Treatment: Rehabilitation  SUBJECTIVE:   SUBJECTIVE STATEMENT: Pt reports the ionto helped last time  Pt accompanied by: self  PERTINENT HISTORY: 58 y.o. male presenting with increased low back pain after a fall. Pt was found to have thoracic myelopathy due to a disc herniation at T10/11. He is now s/p T10 laminectomy and Discectomy. Pt received therapies at [facility] and he d/c home 03/23/23 PMH of anxiety, COPD, DM, Edema, Gout, HTN, HLD, L medial menisectomy, ORIF L radius and ulna.  PRECAUTIONS: Back  WEIGHT BEARING RESTRICTIONS: No  PAIN:  Are you having pain? Yes: NPRS scale: 4/10 Pain location: shoulder, biceps tendon bilateral UE's Pain description: aching Aggravating factors: sitting still Relieving factors: meds  FALLS: Has patient fallen in last 6 months? Yes. Number of falls 1  LIVING ENVIRONMENT: Lives with: lives with their spouse Lives in: House/apartment Stairs:  has ramp Has following equipment at home: Walker - 2 wheeled and bed side commode  PLOF: Independent with basic ADLs  PATIENT GOALS: increase I with ADLS/ADLS  OBJECTIVE:  Note: Objective measures were completed at Evaluation unless otherwise noted.  HAND DOMINANCE: Right  ADLs: Overall ADLs: increased time required, uses w/c, pt is not able to ambulate or stand for ADLs at this time Transfers/ambulation related to ADLs: Eating: mod I Grooming: mod I UB Dressing: setup LB Dressing: mod A,  Toileting: uses bedside commode supervision-min A for scooting Bathing: sponge bathing, min A per pt report Tub Shower transfers: n/a   Equipment: bed side commode Unable to get into bathroom with w/c  IADLs: dependent with IADLS   MOBILITY STATUS:  supervision to min A for transfers  -scooting from w/c, pt is unable to stand for ADLs  Activitiy tolerance: 45 mins to hour with rest break    UPPER EXTREMITY ROM:    Active ROM Right eval Left eval  Shoulder flexion 90 110  Shoulder abduction 90 100  Shoulder adduction    Shoulder extension    Shoulder internal rotation    Shoulder external rotation    Elbow flexion [facility] WFL  Elbow extension [facility] WFL  Wrist flexion    Wrist extension    Wrist ulnar deviation    Wrist radial deviation    Wrist pronation    Wrist supination  90%  (Blank rows = not tested)  UPPER EXTREMITY MMT:     MMT Right eval Left eval  Shoulder flexion 3+/5 4/5  Shoulder abduction    Shoulder adduction  Shoulder extension    Shoulder internal rotation    Shoulder external rotation    Middle trapezius    Lower trapezius    Elbow flexion 4/5 4+/5  Elbow extension 4//5 4+/5  Wrist flexion    Wrist extension    Wrist ulnar deviation    Wrist radial deviation    Wrist pronation    Wrist supination    (Blank rows = not tested)  HAND FUNCTION: Grip strength: Right: 72 lbs; Left: 75 lbs  COORDINATION: 9 Hole Peg test: Right: 23.21 sec; Left: 32.52 sec  SENSATION: WFL      COGNITION: Overall cognitive status: Within functional limits for tasks assessed    OBSERVATIONS: Pleasant male is highly motivated to improve   TODAY'S TREATMENT:     06/17/23 Pt attempted closed chain shoulder flexion wiht cane however this increased pain so discontinued. Internal external rotation with cane, 20 reps mod v.c no increased pain Tableslides for shoulder flexion bilateral UE's no pain 10-20 reps as well as shoulder horizontal abduction, min v.c. Pt reports his shoulders feel better today s/p ionto yesterday. Pt with one scab from small blister on right anterior shoulder,  therapist avoided this area when applying ionto today. Iontophoresis Patch Set-up and Applied.  No adverse reactions observed while in clinic.  Type: Desamethasone Location: bilateral shoulders Dose: 72mA/min, 1mL patch Time: 4hr patch Pt was moniored in the clinic x 10 mins s/p application while therpist discussed ADL strategies with pt. He was instructed to remove the patch in 4 hrs at 6:30. Pt practiced donning/ doffing shoe with reacher and shoe horn. Pt completed task with max difficulty, min v.c.                                                                                                         06/16/23  Ultrasound 1 mhz, 8.0 w/cm 2, 20%  x 8 mins to right shoulder and biceps, no adverse reactions.  Ultrasound  to left shoulder/ biceps , 1.0 w/cm 2, 20% x 8 mins for pain relief no adverse reactions.      Iontophoresis Patch Set-up and Applied.  No adverse reactions observed while in clinic.  Type: Desamethasone Location: bilateral shoulders Dose: 39mA/min, 1mL patch Time: 4hr patch Pt was moniored in the clinic x 5 mins s/p application. He was instructed to remove the patch in 4 hrs at 1:30.                                                                                                          06/04/23:    Ultrasound 1 mhz, 1.0 w/cm 2, 20%  x 8 mins to right shoulder and biceps, no adverse reactions.  Ultrasound  to left shoulder, , 1.0 w/cm 2, 20% x 8 mins for pain relief no adverse reactions.   Closed-chain shoulder flex with BUEs in pain-free range with min cueing for positioning/sholder hike  Iontophoresis Patch Set-up and Applied.  No adverse reactions observed while in clinic.  Pt/wife instructed in Ionto instructions/precautions and contraindications Type: Desamethasone Location: bilateral shoulders Dose: 50mA/min, 1mL patch Time: 4hr patch    06/01/23 Therapist checked progress towards goals as pt was recently hospitalize for diverticulitis.  Pt demonstrates  bilateral shoulder flexion to 70* prior to significant pain. He reports increased bilateral shoulder pain and he has been cautioned to limit repetative use of bilateral UE's and to avoid biceps curls. US  1 mhz, 1.0 w/cm 2, 20%  x 8 mins to right shoulder and biceps, no adverse reactions. Ice pack applied to R shoulder x 7 mins while pt recived UE to left shoulder. No adverse reactions. Pt reports he is nearly pain free afterwards. US  to left shoulder, , 1.0 w/cm 2, 20% x 7 mins for pain relief no adverse reactions. Therapist discussed use of Iontophoreis with pt and he is agreeable. Therapist to request order.  05/20/23- US :1- mhz,  1.0 w/cm 2, 50%, x 10 mins  for L biceps tendon and biceps and left lateral/ anterior shoulder and left shoulder pain, no adverse reactions  Ice pack applied to left shoulder x 8 min  Pt reports pain was improved to 2-3 /10 at end of session  05/14/23-UBE x 6 mins level 3 for conditioning Pt reports difficulty with bathing his bottom and hygine. Therapist discussed with pt/ wife and provided information regarding toilet aide, handheld bidet and long handled sponge. therapist recommends pt does as much as he can then has assistnace from his wife for thoroughness. Therapist also discussed with pt/ wife activies pt can perform at home at w/c level: folding clothes, wiping kitchen counter, and retieveing a snack for the fridge. Closed chain shoulder flexion with cane, to 90*, 20 reps min v.c Biceps curls and triceps extension 15 reps each, min v.c  05/11/23-UBE x 6 mins level 2 for conditioning Biceps curls 2 sets of 10-15 reps with 2 lbs hand weights, triceps ext with red band, min v.c 15 reps each UE Closed chain shoulder flexion with cane 15 reps to shoulder height, min v.c to avoid shoulder hike, then 15 reps with 2 lbs cane  Pt practiced scooting along edge of mat with emphasis on lifting up with arms and legs before scoot, min v.c Pt simulated donning pants with  reacher and min v.c and demonstration. Pt transferred back to w/c via sliding board with supervision min-mod v.c  05/07/23 UBE x 6 mins level 3 for conditioning Pt was instructed in updates to HEP, for bilateral UE's min v.c and demonstration 10 reps each, red band and cane were used see pt instuctions.  05/06/23- UBE x 6 mins level 2 for conditioning, seated in w/c Pt transferred scoot pivot without sliding board to mat with supervision Seated on mat closed chain shoulder flexion and biceps curls 10 reps each, min v.c  Biceps curls x 20 reps with 3lbs weight bilateral UE's, min v.c shoulder flexion bilateal UE's with 2 lbs weight in each hand, 15 reps. Pt practiced scooting on mat with cueing to shift weight fowrard and pushdown through hands to scoot, pt also practiced using this technique for a sliding board transfer back to w/c.  min v.c Pt's wife rports pt an't go into the kitchen because he can't remove his leg rests. Pt practiced removing and reattaching w/c leg rests with min v.c the pt practiced walking his w/c forwards with legs while assisting aby pushing wheels. see education  04/22/23 eval  PATIENT EDUCATION: Education details:    Iontophoresis Instructions, Precautions/Contraindications. tableslides Person educated: Patient  Education method: Explanation, Verbal cues,  Education comprehension: verbalized understanding,  HOME EXERCISE PROGRAM:  red band and vertical cane 05/07/23   GOALS: Goals reviewed with patient? Yes  SHORT TERM GOALS: Target date: 07/01/22  I with inital HEP.  Goal status: ongoing, 06/01/23  2.  Pt will peform LB dressing with min A  Goal status: pt reports donning pants with min A   3.  Pt will consistently perform toilet transfers with supervision.  Goal status: ongoing, supervision with transfer, min A hygeine  4.  Pt will perform snack or beverage prep at a w/c level  Goal status: ongoing, dependent 06/01/23  5. Pt will verbalize  understanding of back precautions.  Goal status: ongoing, 06/01/23, reveiwed BAT, however pt needs reinforcement    LONG TERM GOALS: Target date: 08/24/22  I with updated HEP. Baseline:  Goal status:  ongoing  2.  Pt will perform bathing with supervision/ set up  Goal status:  ongoing  3.  Pt will perform LB dressing with supervision/ set up.  Goal status: I ongoing  4.  Pt will demonstrate ability with stand for 5 mins with min A for ADLs/ IADLs.  Goal status:  ongoing  5.  Pt will perfrom simple home management at a walker level with min A.  Goal status:  ongoing   ASSESSMENT:    CLINICAL IMPRESSION: Pt is progressing towards goals. He is responding well to iontophoresis, with decreased pain.Today was treatment #3.   PERFORMANCE DEFICITS: in functional skills including ADLs, IADLs, ROM, strength, pain, flexibility, mobility, balance, body mechanics, endurance, decreased knowledge of precautions, decreased knowledge of use of DME, and UE functional use,  and psychosocial skills including coping strategies, environmental adaptation, habits, interpersonal interactions, and routines and behaviors.   IMPAIRMENTS: are limiting patient from ADLs, IADLs, rest and sleep, work, play, leisure, and social participation.   CO-MORBIDITIES: Polka have co-morbidities  that affects occupational performance. Patient will benefit from skilled OT to address above impairments and improve overall function.  MODIFICATION OR ASSISTANCE TO COMPLETE EVALUATION: No modification of tasks or assist necessary to complete an evaluation.  OT OCCUPATIONAL PROFILE AND HISTORY: Detailed assessment: Review of records and additional review of physical, cognitive, psychosocial history related to current functional performance.  CLINICAL DECISION MAKING: LOW - limited treatment options, no task modification necessary  REHAB POTENTIAL: Good  EVALUATION COMPLEXITY: Low    PLAN:  OT FREQUENCY:  2x/week  OT DURATION: 12 weeks plus eval  PLANNED INTERVENTIONS: 97168 OT Re-evaluation, 97535 self care/ADL training, 16109 therapeutic exercise, 97530 therapeutic activity, 97112 neuromuscular re-education, 97140 manual therapy, 97116 gait training, 60454 aquatic therapy, 97035 ultrasound, 97018 paraffin, 09811 moist heat, 97010 cryotherapy, passive range of motion, balance training, stair training, functional mobility training, psychosocial skills training, energy conservation, coping strategies training, patient/family education, and DME and/or AE instructions  RECOMMENDED OTHER SERVICES: PT  CONSULTED AND AGREED WITH PLAN OF CARE: Patient  PLAN FOR NEXT SESSION:   US , iontophoresis,ADL strategies   Cyndel Griffey, OTR/L 06/17/2023, 2:19 PM

## 2023-06-19 NOTE — Therapy (Signed)
OUTPATIENT PHYSICAL THERAPY CERVICAL/THORACIC TREATMENT   Progress Note Reporting Period 04/21/23 to 06/15/23  See note below for Objective Data and Assessment of Progress/Goals.     Patient Name: Raymond [name] MRN: [id number] DOB:30-Jan-1966, 58 y.o., male Today's Date: 06/19/2023  END OF SESSION:    Past Medical History:  Diagnosis Date   Acute kidney injury superimposed on chronic kidney disease (HCC) 04/13/2023   Acute on chronic anemia 04/13/2023   Anemia    Anxiety    Back pain    COPD (chronic obstructive pulmonary disease) (HCC)    Depression    Diabetes mellitus without complication (HCC)    Diverticulosis    with hx of LGIB   Edema    GERD (gastroesophageal reflux disease)    GIB (gastrointestinal bleeding)    Gout    Heavy smoker    Hyperlipidemia    Hypertension    Iron deficiency anemia    Sleep apnea    uses a cpap   Swelling of lower extremity    Past Surgical History:  Procedure Laterality Date   CHONDROPLASTY Left 06/28/2014   Procedure: CHONDROPLASTY;  Surgeon: Thera Flake., MD;  Location: [facility];  Service: Orthopedics;  Laterality: Left;   COLONOSCOPY     FOOT FASCIOTOMY     age 57-rt   KNEE ARTHROSCOPY WITH LATERAL MENISECTOMY Left 06/28/2014   Procedure: KNEE ARTHROSCOPY WITH LATERAL MENISECTOMY;  Surgeon: Thera Flake., MD;  Location: [facility];  Service: Orthopedics;  Laterality: Left;   KNEE ARTHROSCOPY WITH MEDIAL MENISECTOMY Left 06/28/2014   Procedure: LEFT KNEE ARTHROSCOPY CHONDROPLASTY/WITH MEDIAL/LATERAL MENISECTOMIES;  Surgeon: Thera Flake., MD;  Location: [facility];  Service: Orthopedics;  Laterality: Left;   ORIF RADIUS & ULNA FRACTURES  2007   left   THORACIC DISCECTOMY N/A 03/12/2023   Procedure: THORACIC LAMINECTOMY AND  DISCECTOMY;  Surgeon: Coletta Memos, MD;  Location: [facility];  Service: Neurosurgery;  Laterality: N/A;   Patient Active Problem List   Diagnosis Date Noted    Cystitis 05/21/2023   Acute diverticulitis 05/21/2023   Vitamin D deficiency 05/14/2023   Low HDL (under 40) 05/14/2023   GERD (gastroesophageal reflux disease) 04/13/2023   Constipation 04/13/2023   Chronic back pain 04/13/2023   DVT, bilateral lower limbs (HCC) 04/13/2023   Coping style affecting medical condition 03/27/2023   Thoracic myelopathy with LE weakness 03/23/2023   HNP (herniated nucleus pulposus with myelopathy), thoracic 03/12/2023   Hyperlipidemia 12/24/2022   Failure to thrive in adult 12/24/2022   Tobacco abuse 03/18/2018   BPH associated with nocturia 03/11/2017   History of substance abuse (HCC) 03/11/2017   Restless leg syndrome 03/11/2017   Restrictive lung disease 05/14/2016   OSA on CPAP 05/14/2016   Moderate COPD (chronic obstructive pulmonary disease) (HCC) 05/14/2016   Smoking greater than 40 pack years 05/14/2016   SOB (shortness of breath) 10/06/2014   Edema of extremities 10/06/2014   Benign essential HTN 10/06/2014   Type 2 diabetes mellitus, without long-term current use of insulin (HCC) 07/18/2011    PCP: Venetia Constable, MD  REFERRING PROVIDER:  Charlton Amor, PA-C   REFERRING DIAG: [phone] (ICD-10-CM) - Other spondylosis with myelopathy, thoracic region   THERAPY DIAG:  No diagnosis found.  Rationale for Evaluation and Treatment: Rehabilitation  ONSET DATE: October 2024  SUBJECTIVE:  SUBJECTIVE STATEMENT: Pt reports he is scheduled for an MRI 2/19 to assess his back surgery. His Baclofen was increased, but is not much help.  From eval: Pt states he fell, broke his ankle and was found to later have a ruptured disc and had to get back surgery. Went to inpatient rehab x 3 weeks. Felt like he built up his upper body. Has now been  home x 1 week. States he has not been doing much since then -- has done some sitting exercises. Was able to stand for 3 min bouts for a total of about 12 min using RW at inpatient rehab. Has history of bad knees and will be getting gel shots. 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 He was found to have large HNP T10/T11 and underwent T10 laminectomy with discectomy by Dr. Franky Macho on 03/12/2023   PAIN:  Are you having pain? No  PRECAUTIONS: None  RED FLAGS: None     WEIGHT BEARING RESTRICTIONS: No  FALLS:  Has patient fallen in last 6 months? Yes. Number of falls 1 -- last fall Oct 2024  LIVING ENVIRONMENT: Lives with: lives with their spouse Lives in: House/apartment Stairs:  ramp Has following equipment at home: Environmental consultant - 2 wheeled, Wheelchair (manual), and upright walker, hospital bed  OCCUPATION: Retired  PLOF:  Able to do his own ADLs but easier to have wife assist him  PATIENT GOALS: Walk with the walker at least household distances  NEXT MD VISIT: 04/21/23 [place]  OBJECTIVE:  Note: Objective measures were completed at Evaluation unless otherwise noted.  DIAGNOSTIC FINDINGS:  03/12/23 MRI THORACIC SPINE IMPRESSION:   1. Moderate-sized lobulated left subarticular to foraminal disc protrusion at T10-11 with resultant severe spinal stenosis. Cord is compressed and deviated to the right at this level. Associated cord signal changes concerning for edema and/or myelomalacia. 2. No other acute abnormality within the thoracic spine. 3. Degenerative spondylosis at T8-9 through T11-12 without significant spinal stenosis. Associated moderate to severe bilateral foraminal narrowing at these  levels as above.  MRI LUMBAR SPINE IMPRESSION:   1. No acute abnormality within the lumbar spine. 2. Multifactorial degenerative changes at L4-5 with resultant mild canal with severe left and moderate right lateral recess stenosis, with severe bilateral L4 foraminal narrowing. 3. Additional mild noncompressive disc bulging and facet hypertrophy elsewhere within the lumbar spine as above. No other significant stenosis or frank neural impingement.   Critical Value/emergent results were called by telephone at the time of interpretation on 03/12/2023 at 12:41 am to provider Dr. Madilyn Hook, who verbally acknowledged these results.  PATIENT SURVEYS:  FOTO 42; predicted 30  COGNITION: Overall cognitive status: Within functional limits for tasks assessed  SENSATION: WFL  POSTURE: rounded shoulders and forward head  PALPATION: No overt tenderness to palpation   CERVICAL ROM: WNL  LOWER EXTREMITY MMT:    MMT Right eval Left eval  Hip flexion 3+ 3  Hip extension 3+ 3+  Hip abduction 3+ 3+  Hip adduction    Hip internal rotation    Hip external rotation    Knee flexion 4- 3+  Knee extension 4- 3+  Ankle dorsiflexion    Ankle plantarflexion    Ankle inversion    Ankle eversion     (  Blank rows = not tested)   FUNCTIONAL TESTS:  Transfers:  Chair to bed: Multiple seated scoots with UEs  Sit<>stand: max A -- limited due to knee pain Supine to sit<>sit: min A for trunk and LE negotiation. Not performing log rolling  TODAY'S TREATMENT:                                                                                                                              DATE:  06/22/23 Slideboard transfer to mat Seated rows  Seated fitter pushes  STS with chair in front    06/17/23 NuStep L5 x 8 minutes, including multiple rest breaks due to SOB, U and LE. Performed multiple transfers including up grade and down grade with sliding board and S, after set up. Educated how to position the  sliding board so it does not bend the brake lever. Attempted sit to stand from elevated mat using RW. He was able to rise off the mat and come to nearly full stand, but unalbe to achieve full upright posture due to BLE fatigue and R knee pain.  06/15/23 NuStep- sliding board transfer up grade, CGA NuStep L3 x 5 minutes, strap around knees to hold better alignment. First with UE support, then legs only. Painful for knees. Status re-assessed for PN. See goals Standing in parallel bars- Able to achieve standing using BUE to push up. He die rely on the UE support in stance, attempted to facilitate decreased UE reliance. Able to reduce the use of UE slightly, but very unstable at knees.  06/08/23 Sliding board transfer up minimal grade to mat, S after A with board placement. Sit to supine, S Patient's legs began spasming, and he reported pain and poor sensory awareness of LEs in supine. Elevated legs on physioball, with helped. Supine gentle DKTC, bridge, hip IR with 5 sec hold and bridge with ball roll, x 10 each to patient tolerance. Rolled to L side and attempted hip abd, but his spasms returned and were painful. Sup to sit with min A. He reported continued diminished sensory feedback.  06/01/23 New W/C adjustment- armrests raised to better support his elbows at rest Sliding board transfer to mat, min A for board placement, then S, improved scooting, still required many scoots. Sit to supine with S Repeated rolling to each side lifting the leg to cross body rather than push on mat, 5 x each way. Very challenging, but complete. Sup to sit with S. Sit to stand in parallel bars. First attempt, unable to stand, second and third attempts, reliant on UE support, but able to achieve full stance. X 10 sec. Mod A for standing  05/20/23 Assessed L shoulder due to increased pain. Negative full can, belly press, resisted ER, positive empty can, Hawkin's. TTP mildly over L supraspinatus muscle and tendon,  biceps tendon. Sliding board transfer to mat with S, VC to push with legs to assist. Seated L shoulder distraction holding the edge of  mat and leaning to R. Seated press ups for scapular stability. Moved sit to supine with S. His feet and ankles appear more swollen today. He attemtped to push into physioball to activate LE. However, his legs kept spasming, so this was deferred. Attempted sit to stand from elevated mat, but he was unable to raise hips today.  05/14/23 Transfer to mat, Challenging for level transfer, but I Sit to Supine Mi, improved ability to lift legs to surface Supine with legs over G physioball- bridge, DKTC, quick side to side rotations, alternating lifting one leg off ball to mat and back up, 10 reps each. Supine to long sit, then scoot hips forward to the edge of mat, very challenging, but able to accomplish with VC from therapist for correct technique, lean and scoot. Fitter press, 1 x 10 with 1 blue band, then 1 x 5 with 2 blue bands, focus Standing in parallel bars from elevated W/C seat. Pushes up from bars with min A and extra time, still largely reliant on UE support, but trying to move and relax arms to rely on his legs more. Stood 3 x 30 sec, CGA once up with close guarding at knees in case of buckling.  05/11/23 W/C > mat scoot transfer with S. Difficulty getting started, but completed without physical A. Moved into longsit on mat, then scooted over and backwards. Performed multiple movements on the mat in order to facilitate active trunk stability, including prone lying, attempted to move to quadruped, but his knees kept slipping back as he tried to push up. Rolled to R and performed L hip abd, repeated on L sidelie for R hip. Sup to sit on edge of mat with much less effort required today, S Sit<> partial stand from elevated mat with hands on chair in front of him, able to come to stand with hands on seat, then place hands on the armrests of the chair. 3 reps. Sit  to stand with mod A to initiate and given extra time to rise. Parallel bars- Sit to stand from W/C with Airex pad in seat to elevate slightly. Pushed up on parallel bars. Able to achieve fully upright stance and perform lat weight shifts, but had difficulty once he tried to lift either foot to simulate taking a step, poor control of limb in space.  05/07/23 Sliding board transfer to mat using lean technique with shoulders away from hips to initiate. He then was able to lift his hips and scoot the rest of the way. Supine bridging x 10, able to clear hips Repeated roll to each side x 3 using trunk into flexion. Fitter press with 2 blue bands x 10 each leg, very challenging. Functional trunk and LE strengthening, seated on mat, turn to R, walking both hands away from trunk, then lifting L LE to touch heel to surface of mat. X 5 lifts to each side, very challenging. Sit<> partial stand from elevated mat with hands on chair in front of him, able to come to stand with hands on seat, then place hands on the armrests of the chair. Moved to parallel bars with Air ex pad on top of his W/C cushion to elevate him. Sit > Stand pulling bars, with mod A and time to push into full upright posture where he stood x approx 30 seconds. Relies on arms to A.  05/06/23 Lateral transfers from W/C <> mat with min A. Sit to supine with light min A to BLE Rolled to L- Therapist facilitated lower  trunk active mobilization in ant/post and inf/sup planes of movement. Moved to R LE with acitve hip abd, very weak, tends to turn and use quads, clamshell also required mod A. Facilitated repeated rolling using normalized technique with RLE and UE held in the air to engage trunk. Very challenging, but able to complete x 4. Repeated all activities in R side lying. Moved from Sup > long sit I, but did report some strain in back Sit <> partial stand from elevated mat, hands on the seat or armrests of a chair facing him, required +2 to  initiate due to B knee pain, but he was able to rise into bent over standing position x5 and hold for up to 5 seconds. B scoot transfers, emphasizing lifting hips off the surface by pushing through legs, x 5 each way on mat Transfer back to W/C with CGA. Mat > W/C with CGA  04/23/23 Seated heel slide 2x10 Transfer x2; min A to stabilize w/c while getting to bed. Pt utilizing multiple scoots and UEs SLR short range x10 (>10 deg extensor lag bilat) Quad set 2x10 SAQ 2# ankle weight 2x10 Supine clamshell green TB 2x10 Bridge 2x10 Supine to sit log roll with SBA for v/cs In // bars pulling up for 1/4 to standing 2x5  In w/c pushing with legs only 2x10' In w/c pulling with legs only 2x10'  04/21/23 See HEP below   PATIENT EDUCATION:  Education details: Exam findings, POC, initial HEP Person educated: Patient Education method: Explanation, Demonstration, and Handouts Education comprehension: verbalized understanding, returned demonstration, and needs further education  HOME EXERCISE PROGRAM: Access Code: [email or web address] URL: [email or web address] Date: 04/21/2023 Prepared by: Vernon Prey April Kirstie Peri  Exercises - Staggered Bridge  - 1 x daily - 7 x weekly - 2 sets - 10 reps - Hooklying Isometric Clamshell  - 1 x daily - 7 x weekly - 2 sets - 10 reps - Small Range Straight Leg Raise  - 1 x daily - 7 x weekly - 2 sets - 10 reps  ASSESSMENT:  CLINICAL IMPRESSION: Patient's L brake on W/C out of position, such that it could not lock the wheel from spinning. Brake adjusted and patient educated to how to complete this himself if it happens again. Also educated on how to set the lock so that he does not push out on the handle. Continued with LE strengthening activities, tolerated increased resistance and time on NuStep, but it seemed to fatigue his legs.  From eval: Patient is a 58 y.o. M who was seen today for physical therapy evaluation and treatment for thoracic myelopathy.  PMH significant for bilat knee OA (pt to get gel shots and is interested in future TKA once he gets his weight down) and T10 laminectomy with discectomy by Dr. Franky Macho on 03/12/2023. Went to inpatient rehab x 3 weeks. Has now been at home x 1 week. Pt states he does not currently have any known precautions. Assessment significant for gross bilat LE weakness (L LE weaker than R) affecting standing tolerance, transfers and balance. Pt is limited due to his knee pain. Pt will highly benefit from PT to address these deficits to reach pt's goals for increased mobility and amb.   OBJECTIVE IMPAIRMENTS: Abnormal gait, decreased balance, decreased endurance, decreased mobility, difficulty walking, decreased ROM, decreased strength, increased edema, improper body mechanics, postural dysfunction, obesity, and pain.   GOALS: Goals reviewed with patient? Yes  SHORT TERM GOALS: Target date: 04/15/24   Pt will be ind with initial  HEP Baseline:  Goal status: 05/06/23- provided, but inconsistent in performing. Ongoing  2.  Pt will be able to tolerate standing x 1 min in the parallel bars with BUE support and CGA Baseline: 06/01/23- Goal updated after recent hospitalization Goal status: 06/15/23- Stands x up to 30 sec with BUE support in parallel bars, Improved ability to slide hands with slightly less reliance on UE support.ongoing  3.  Pt will be able to perform sliding board transfer with S, including set up, and up a slight (3") grade Baseline: 06/01/23- Goal updated after recent hospitalization Goal status: 06/15/23- Min A for set up, S for level transfer, required mult scoots, improved excursion with shifts.   LONG TERM GOALS: Target date: 07/14/2023   Pt will be ind with management and progression of HEP Baseline:  Goal status: INITIAL  2.  Pt will be able to amb at least 20' with RW min A for home mobility Baseline:  Goal status: 06/15/23- Stands x up to 30 sec with BUE support in parallel bars,  Improved ability to slide hands with slightly less reliance on UE support.ongoing  3.  Pt will be able to perform at least 2 reps of sit to stand in 30 sec with UE support Baseline: Unable to perform complete STS without mod A Goal status: 06/15/23-Unable to stand without UE support, ongoing  4.  Pt will have increased FOTO to >/=54 Baseline:  Goal status: 06/15/23-26.8, ongoing  PLAN:  PT FREQUENCY: 2x/week  PT DURATION: 12 weeks  PLANNED INTERVENTIONS: 97164- PT Re-evaluation, 97110-Therapeutic exercises, 97530- Therapeutic activity, 97112- Neuromuscular re-education, 97535- Self Care, 16109- Manual therapy, [phone]- Gait training, [phone]- Orthotic Fit/training, [phone]- Aquatic Therapy, 97014- Electrical stimulation (unattended), [id number]- Ultrasound, 29562- Ionotophoresis 4mg /ml Dexamethasone, Patient/Family education, Balance training, Stair training, Taping, Dry Needling, Joint mobilization, Spinal mobilization, Cryotherapy, and Moist heat  PLAN FOR NEXT SESSION: Assess response to HEP. Continue to work on LE strengthening and transfers/standing.    Iona Beard, DPT 06/19/2023, 9:28 AM

## 2023-06-22 ENCOUNTER — Ambulatory Visit: Payer: PRIVATE HEALTH INSURANCE

## 2023-06-22 ENCOUNTER — Ambulatory Visit: Payer: PRIVATE HEALTH INSURANCE | Admitting: Occupational Therapy

## 2023-06-22 DIAGNOSIS — R2689 Other abnormalities of gait and mobility: Secondary | ICD-10-CM

## 2023-06-22 DIAGNOSIS — R29898 Other symptoms and signs involving the musculoskeletal system: Secondary | ICD-10-CM

## 2023-06-22 DIAGNOSIS — M79622 Pain in left upper arm: Secondary | ICD-10-CM

## 2023-06-22 DIAGNOSIS — M25611 Stiffness of right shoulder, not elsewhere classified: Secondary | ICD-10-CM

## 2023-06-22 DIAGNOSIS — M79621 Pain in right upper arm: Secondary | ICD-10-CM

## 2023-06-22 DIAGNOSIS — G8929 Other chronic pain: Secondary | ICD-10-CM

## 2023-06-22 DIAGNOSIS — M6281 Muscle weakness (generalized): Secondary | ICD-10-CM

## 2023-06-22 DIAGNOSIS — R2681 Unsteadiness on feet: Secondary | ICD-10-CM

## 2023-06-22 DIAGNOSIS — M25661 Stiffness of right knee, not elsewhere classified: Secondary | ICD-10-CM

## 2023-06-22 DIAGNOSIS — M25612 Stiffness of left shoulder, not elsewhere classified: Secondary | ICD-10-CM

## 2023-06-22 DIAGNOSIS — M25662 Stiffness of left knee, not elsewhere classified: Secondary | ICD-10-CM

## 2023-06-22 NOTE — Therapy (Signed)
OUTPATIENT OCCUPATIONAL THERAPY NEURO Treatment  Patient Name: Raymond [name] MRN: [id number] DOB:04-Nov-1965, 58 y.o., male Today's Date: 06/22/2023  PCP: Dr. Tawanna Solo PROVIDER: Dr. Carlis Abbott  END OF SESSION:  OT End of Session - 06/22/23 1201     Visit Number 11    Number of Visits 32    Date for OT Re-Evaluation 08/24/23    Authorization Type cigna    Authorization Time Period 12 weeks    OT Start Time 1015    OT Stop Time 1058    OT Time Calculation (min) 43 min                   Past Medical History:  Diagnosis Date   Acute kidney injury superimposed on chronic kidney disease (HCC) 04/13/2023   Acute on chronic anemia 04/13/2023   Anemia    Anxiety    Back pain    COPD (chronic obstructive pulmonary disease) (HCC)    Depression    Diabetes mellitus without complication (HCC)    Diverticulosis    with hx of LGIB   Edema    GERD (gastroesophageal reflux disease)    GIB (gastrointestinal bleeding)    Gout    Heavy smoker    Hyperlipidemia    Hypertension    Iron deficiency anemia    Sleep apnea    uses a cpap   Swelling of lower extremity    Past Surgical History:  Procedure Laterality Date   CHONDROPLASTY Left 06/28/2014   Procedure: CHONDROPLASTY;  Surgeon: Thera Flake., MD;  Location: [facility];  Service: Orthopedics;  Laterality: Left;   COLONOSCOPY     FOOT FASCIOTOMY     age 13-rt   KNEE ARTHROSCOPY WITH LATERAL MENISECTOMY Left 06/28/2014   Procedure: KNEE ARTHROSCOPY WITH LATERAL MENISECTOMY;  Surgeon: Thera Flake., MD;  Location: [facility];  Service: Orthopedics;  Laterality: Left;   KNEE ARTHROSCOPY WITH MEDIAL MENISECTOMY Left 06/28/2014   Procedure: LEFT KNEE ARTHROSCOPY CHONDROPLASTY/WITH MEDIAL/LATERAL MENISECTOMIES;  Surgeon: Thera Flake., MD;  Location: [facility];  Service: Orthopedics;  Laterality: Left;   ORIF RADIUS & ULNA FRACTURES  2007   left   THORACIC DISCECTOMY  N/A 03/12/2023   Procedure: THORACIC LAMINECTOMY AND  DISCECTOMY;  Surgeon: Coletta Memos, MD;  Location: [facility] OR;  Service: Neurosurgery;  Laterality: N/A;   Patient Active Problem List   Diagnosis Date Noted   Cystitis 05/21/2023   Acute diverticulitis 05/21/2023   Vitamin D deficiency 05/14/2023   Low HDL (under 40) 05/14/2023   GERD (gastroesophageal reflux disease) 04/13/2023   Constipation 04/13/2023   Chronic back pain 04/13/2023   DVT, bilateral lower limbs (HCC) 04/13/2023   Coping style affecting medical condition 03/27/2023   Thoracic myelopathy with LE weakness 03/23/2023   HNP (herniated nucleus pulposus with myelopathy), thoracic 03/12/2023   Hyperlipidemia 12/24/2022   Failure to thrive in adult 12/24/2022   Tobacco abuse 03/18/2018   BPH associated with nocturia 03/11/2017   History of substance abuse (HCC) 03/11/2017   Restless leg syndrome 03/11/2017   Restrictive lung disease 05/14/2016   OSA on CPAP 05/14/2016   Moderate COPD (chronic obstructive pulmonary disease) (HCC) 05/14/2016   Smoking greater than 40 pack years 05/14/2016   SOB (shortness of breath) 10/06/2014   Edema of extremities 10/06/2014   Benign essential HTN 10/06/2014   Type 2 diabetes mellitus, without long-term current use of insulin (HCC) 07/18/2011    ONSET  DATE: 04/08/23- referral date  REFERRING DIAG:  Diagnosis  M47.14 (ICD-10-CM) - Other spondylosis with myelopathy, thoracic region    THERAPY DIAG:  Muscle weakness (generalized) - Plan: Ot plan of care cert/re-cert,   Stiffness of right knee, not elsewhere classified - Plan: Ot plan of care cert/re-cert,   Other symptoms and signs involving the musculoskeletal system - Plan: Ot plan of care cert/re-cert,   Unsteadiness on feet - Plan: Ot plan of care cert/re-cert,   Other abnormalities of gait and mobility - Plan: Ot plan of care cert/re-cert,   Pain in left upper arm - Plan: Ot plan of care cert/re-cert  Pain in right  upper arm - Plan: Ot plan of care cert/re-cert  Stiffness of left shoulder, not elsewhere classified - Plan: Ot plan of care cert/re-cert  Stiffness of right shoulder, not elsewhere classified - Plan: Ot plan of care cert/re-cert  Rationale for Evaluation and Treatment: Rehabilitation  SUBJECTIVE:   SUBJECTIVE STATEMENT: Pt reports  shoulder pain this a.m.  Pt accompanied by: self  PERTINENT HISTORY: 58 y.o. male presenting with increased low back pain after a fall. Pt was found to have thoracic myelopathy due to a disc herniation at T10/11. He is now s/p T10 laminectomy and Discectomy. Pt received therapies at [facility] and he d/c home 03/23/23 PMH of anxiety, COPD, DM, Edema, Gout, HTN, HLD, L medial menisectomy, ORIF L radius and ulna.  PRECAUTIONS: Back  WEIGHT BEARING RESTRICTIONS: No  PAIN:  Are you having pain? Yes: NPRS scale: 4/10 Pain location: shoulder, biceps tendon bilateral UE's Pain description: aching Aggravating factors: sitting still Relieving factors: meds  FALLS: Has patient fallen in last 6 months? Yes. Number of falls 1  LIVING ENVIRONMENT: Lives with: lives with their spouse Lives in: House/apartment Stairs:  has ramp Has following equipment at home: Walker - 2 wheeled and bed side commode  PLOF: Independent with basic ADLs  PATIENT GOALS: increase I with ADLS/ADLS  OBJECTIVE:  Note: Objective measures were completed at Evaluation unless otherwise noted.  HAND DOMINANCE: Right  ADLs: Overall ADLs: increased time required, uses w/c, pt is not able to ambulate or stand for ADLs at this time Transfers/ambulation related to ADLs: Eating: mod I Grooming: mod I UB Dressing: setup LB Dressing: mod A,  Toileting: uses bedside commode supervision-min A for scooting Bathing: sponge bathing, min A per pt report Tub Shower transfers: n/a  Equipment: bed side commode Unable to get into bathroom with w/c  IADLs: dependent with IADLS   MOBILITY STATUS:   supervision to min A for transfers  -scooting from w/c, pt is unable to stand for ADLs  Activitiy tolerance: 45 mins to hour with rest break    UPPER EXTREMITY ROM:    Active ROM Right eval Left eval  Shoulder flexion 90 110  Shoulder abduction 90 100  Shoulder adduction    Shoulder extension    Shoulder internal rotation    Shoulder external rotation    Elbow flexion [facility] WFL  Elbow extension [facility] WFL  Wrist flexion    Wrist extension    Wrist ulnar deviation    Wrist radial deviation    Wrist pronation    Wrist supination  90%  (Blank rows = not tested)  UPPER EXTREMITY MMT:     MMT Right eval Left eval  Shoulder flexion 3+/5 4/5  Shoulder abduction    Shoulder adduction    Shoulder extension    Shoulder internal rotation    Shoulder external rotation  Middle trapezius    Lower trapezius    Elbow flexion 4/5 4+/5  Elbow extension 4//5 4+/5  Wrist flexion    Wrist extension    Wrist ulnar deviation    Wrist radial deviation    Wrist pronation    Wrist supination    (Blank rows = not tested)  HAND FUNCTION: Grip strength: Right: 72 lbs; Left: 75 lbs  COORDINATION: 9 Hole Peg test: Right: 23.21 sec; Left: 32.52 sec  SENSATION: WFL      COGNITION: Overall cognitive status: Within functional limits for tasks assessed    OBSERVATIONS: Pleasant male is highly motivated to improve   TODAY'S TREATMENT:     06/22/23-Ultrasound 1 mhz, 1.0 w/cm 2, 20%  x 7 mins to right shoulder and biceps, no adverse reactions.  Ultrasound  to left shoulder/ biceps , 1.0 w/cm 2, 20% x 7 mins for pain relief no adverse reactions.    Pt reports decreased pain following [place]. Closed chain chest press and internal external rotation, pt perfromed without reports of pain Pt with 1 remaining scab on right shoulder, this area was avoided with application of ionto. Iontophoresis Patch Set-up and Applied.  No adverse reactions observed while in clinic.  Type:  Desamethasone Location: bilateral shoulders Dose: 71mA/min, 1mL patch Time: 4hr patch Pt was monitored in clinic x 5 mins, no adverse reactions   06/17/23 Pt attempted closed chain shoulder flexion with cane however this increased pain so discontinued. Internal external rotation with cane, 20 reps mod v.c no increased pain Tableslides for shoulder flexion bilateral UE's no pain 10-20 reps as well as shoulder horizontal abduction, min v.c. Pt reports his shoulders feel better today s/p ionto yesterday. Pt with one scab from small blister on right anterior shoulder, therapist avoided this area when applying ionto today. Iontophoresis Patch Set-up and Applied.  No adverse reactions observed while in clinic.  Type: Desamethasone Location: bilateral shoulders Dose: 25mA/min, 1mL patch Time: 4hr patch Pt was moniored in the clinic x 10 mins s/p application while therpist discussed ADL strategies with pt. He was instructed to remove the patch in 4 hrs at 6:30. Pt practiced donning/ doffing shoe with reacher and shoe horn. Pt completed task with max difficulty, min v.c.                                                                                                         06/16/23  Ultrasound 1 mhz, 8.0 w/cm 2, 20%  x 8 mins to right shoulder and biceps, no adverse reactions.  Ultrasound  to left shoulder/ biceps , 1.0 w/cm 2, 20% x 8 mins for pain relief no adverse reactions.      Iontophoresis Patch Set-up and Applied.  No adverse reactions observed while in clinic.  Type: Desamethasone Location: bilateral shoulders Dose: 82mA/min, 1mL patch Time: 4hr patch Pt was moniored in the clinic x 5 mins s/p application. He was instructed to remove the patch in 4 hrs at 1:30.  06/04/23:    Ultrasound 1 mhz, 1.0 w/cm 2, 20%  x 8 mins to right shoulder and biceps, no adverse reactions.  Ultrasound  to  left shoulder, , 1.0 w/cm 2, 20% x 8 mins for pain relief no adverse reactions.   Closed-chain shoulder flex with BUEs in pain-free range with min cueing for positioning/sholder hike  Iontophoresis Patch Set-up and Applied.  No adverse reactions observed while in clinic.  Pt/wife instructed in Ionto instructions/precautions and contraindications Type: Desamethasone Location: bilateral shoulders Dose: 16mA/min, 1mL patch Time: 4hr patch    06/01/23 Therapist checked progress towards goals as pt was recently hospitalize for diverticulitis.  Pt demonstrates bilateral shoulder flexion to 70* prior to significant pain. He reports increased bilateral shoulder pain and he has been cautioned to limit repetative use of bilateral UE's and to avoid biceps curls. [place] 1 mhz, 1.0 w/cm 2, 20%  x 8 mins to right shoulder and biceps, no adverse reactions. Ice pack applied to R shoulder x 7 mins while pt recived UE to left shoulder. No adverse reactions. Pt reports he is nearly pain free afterwards. [place] to left shoulder, , 1.0 w/cm 2, 20% x 7 mins for pain relief no adverse reactions. Therapist discussed use of Iontophoreis with pt and he is agreeable. Therapist to request order.  05/20/23- US:1- mhz,  1.0 w/cm 2, 50%, x 10 mins  for L biceps tendon and biceps and left lateral/ anterior shoulder and left shoulder pain, no adverse reactions  Ice pack applied to left shoulder x 8 min  Pt reports pain was improved to 2-3 /10 at end of session  05/14/23-UBE x 6 mins level 3 for conditioning Pt reports difficulty with bathing his bottom and hygine. Therapist discussed with pt/ wife and provided information regarding toilet aide, handheld bidet and long handled sponge. therapist recommends pt does as much as he can then has assistnace from his wife for thoroughness. Therapist also discussed with pt/ wife activies pt can perform at home at w/c level: folding clothes, wiping kitchen counter, and retieveing a  snack for the fridge. Closed chain shoulder flexion with cane, to 90*, 20 reps min v.c Biceps curls and triceps extension 15 reps each, min v.c  05/11/23-UBE x 6 mins level 2 for conditioning Biceps curls 2 sets of 10-15 reps with 2 lbs hand weights, triceps ext with red band, min v.c 15 reps each UE Closed chain shoulder flexion with cane 15 reps to shoulder height, min v.c to avoid shoulder hike, then 15 reps with 2 lbs cane  Pt practiced scooting along edge of mat with emphasis on lifting up with arms and legs before scoot, min v.c Pt simulated donning pants with reacher and min v.c and demonstration. Pt transferred back to w/c via sliding board with supervision min-mod v.c  05/07/23 UBE x 6 mins level 3 for conditioning Pt was instructed in updates to HEP, for bilateral UE's min v.c and demonstration 10 reps each, red band and cane were used see pt instuctions.  05/06/23- UBE x 6 mins level 2 for conditioning, seated in w/c Pt transferred scoot pivot without sliding board to mat with supervision Seated on mat closed chain shoulder flexion and biceps curls 10 reps each, min v.c  Biceps curls x 20 reps with 3lbs weight bilateral UE's, min v.c shoulder flexion bilateal UE's with 2 lbs weight in each hand, 15 reps. Pt practiced scooting on mat with cueing to shift weight fowrard and pushdown through hands to scoot, pt also  practiced using this technique for a sliding board transfer back to w/c. min v.c Pt's wife rports pt an't go into the kitchen because he can't remove his leg rests. Pt practiced removing and reattaching w/c leg rests with min v.c the pt practiced walking his w/c forwards with legs while assisting aby pushing wheels. see education  04/22/23 eval  PATIENT EDUCATION: Education details:    Iontophoresis Instructions, Precautions/Contraindications.  Person educated: Patient  Education method: Explanation, Verbal cues,  Education comprehension: verbalized understanding,  HOME  EXERCISE PROGRAM:  red band and vertical cane 05/07/23   GOALS: Goals reviewed with patient? Yes  SHORT TERM GOALS: Target date: 07/01/22  I with inital HEP.  Goal status: ongoing, 06/01/23  2.  Pt will peform LB dressing with min A  Goal status: pt reports donning pants with min A   3.  Pt will consistently perform toilet transfers with supervision.  Goal status: ongoing, supervision with transfer, min A hygeine  4.  Pt will perform snack or beverage prep at a w/c level  Goal status: ongoing, dependent 06/01/23  5. Pt will verbalize understanding of back precautions.  Goal status: ongoing, 06/01/23, reveiwed BAT, however pt needs reinforcement    LONG TERM GOALS: Target date: 08/24/22  I with updated HEP. Baseline:  Goal status:  ongoing  2.  Pt will perform bathing with supervision/ set up  Goal status:  ongoing  3.  Pt will perform LB dressing with supervision/ set up.  Goal status: I ongoing  4.  Pt will demonstrate ability with stand for 5 mins with min A for ADLs/ IADLs.  Goal status:  ongoing  5.  Pt will perfrom simple home management at a walker level with min A.  Goal status:  ongoing   ASSESSMENT:    CLINICAL IMPRESSION: Pt is progressing towards goals. He is responding well to iontophoresis, with decreased  overall pain.Today was treatment #4. Pt reports that his ADLS are going better.   PERFORMANCE DEFICITS: in functional skills including ADLs, IADLs, ROM, strength, pain, flexibility, mobility, balance, body mechanics, endurance, decreased knowledge of precautions, decreased knowledge of use of DME, and UE functional use,  and psychosocial skills including coping strategies, environmental adaptation, habits, interpersonal interactions, and routines and behaviors.   IMPAIRMENTS: are limiting patient from ADLs, IADLs, rest and sleep, work, play, leisure, and social participation.   CO-MORBIDITIES: Massmann have co-morbidities  that affects  occupational performance. Patient will benefit from skilled OT to address above impairments and improve overall function.  MODIFICATION OR ASSISTANCE TO COMPLETE EVALUATION: No modification of tasks or assist necessary to complete an evaluation.  OT OCCUPATIONAL PROFILE AND HISTORY: Detailed assessment: Review of records and additional review of physical, cognitive, psychosocial history related to current functional performance.  CLINICAL DECISION MAKING: LOW - limited treatment options, no task modification necessary  REHAB POTENTIAL: Good  EVALUATION COMPLEXITY: Low    PLAN:  OT FREQUENCY: 2x/week  OT DURATION: 12 weeks plus eval  PLANNED INTERVENTIONS: 97168 OT Re-evaluation, 97535 self care/ADL training, 31517 therapeutic exercise, 97530 therapeutic activity, 97112 neuromuscular re-education, 97140 manual therapy, 97116 gait training, 61607 aquatic therapy, 97035 ultrasound, 97018 paraffin, 37106 moist heat, 97010 cryotherapy, passive range of motion, balance training, stair training, functional mobility training, psychosocial skills training, energy conservation, coping strategies training, patient/family education, and DME and/or AE instructions  RECOMMENDED OTHER SERVICES: PT  CONSULTED AND AGREED WITH PLAN OF CARE: Patient  PLAN FOR NEXT SESSION:   [place],  continue iontophoresis #5,ADL strategies  Saphira Lahmann, OTR/L 06/22/2023, 12:02 PM

## 2023-06-23 ENCOUNTER — Other Ambulatory Visit: Payer: Self-pay | Admitting: Physical Medicine and Rehabilitation

## 2023-06-24 ENCOUNTER — Ambulatory Visit: Payer: PRIVATE HEALTH INSURANCE | Admitting: Occupational Therapy

## 2023-06-24 ENCOUNTER — Ambulatory Visit: Payer: PRIVATE HEALTH INSURANCE | Admitting: Physical Therapy

## 2023-06-24 ENCOUNTER — Encounter: Payer: Self-pay | Admitting: Physical Therapy

## 2023-06-24 DIAGNOSIS — M25611 Stiffness of right shoulder, not elsewhere classified: Secondary | ICD-10-CM

## 2023-06-24 DIAGNOSIS — M79622 Pain in left upper arm: Secondary | ICD-10-CM

## 2023-06-24 DIAGNOSIS — M6281 Muscle weakness (generalized): Secondary | ICD-10-CM

## 2023-06-24 DIAGNOSIS — M25612 Stiffness of left shoulder, not elsewhere classified: Secondary | ICD-10-CM

## 2023-06-24 DIAGNOSIS — G8929 Other chronic pain: Secondary | ICD-10-CM

## 2023-06-24 DIAGNOSIS — M79621 Pain in right upper arm: Secondary | ICD-10-CM

## 2023-06-24 DIAGNOSIS — R2689 Other abnormalities of gait and mobility: Secondary | ICD-10-CM

## 2023-06-24 DIAGNOSIS — R2681 Unsteadiness on feet: Secondary | ICD-10-CM

## 2023-06-24 DIAGNOSIS — R29898 Other symptoms and signs involving the musculoskeletal system: Secondary | ICD-10-CM

## 2023-06-24 DIAGNOSIS — M25661 Stiffness of right knee, not elsewhere classified: Secondary | ICD-10-CM

## 2023-06-24 DIAGNOSIS — M25662 Stiffness of left knee, not elsewhere classified: Secondary | ICD-10-CM

## 2023-06-24 NOTE — Therapy (Signed)
OUTPATIENT PHYSICAL THERAPY CERVICAL/THORACIC TREATMENT     Patient Name: Raymond [name] MRN: [id number] DOB:1965-09-28, 58 y.o., male Today's Date: 06/24/2023  END OF SESSION:  PT End of Session - 06/24/23 1851     Visit Number 13    Date for PT Re-Evaluation 07/27/23    PT Start Time 1145    PT Stop Time 1225    PT Time Calculation (min) 40 min    Activity Tolerance Patient tolerated treatment well;Patient limited by pain    Behavior During Therapy [facility] for tasks assessed/performed            Past Medical History:  Diagnosis Date   Acute kidney injury superimposed on chronic kidney disease (HCC) 04/13/2023   Acute on chronic anemia 04/13/2023   Anemia    Anxiety    Back pain    COPD (chronic obstructive pulmonary disease) (HCC)    Depression    Diabetes mellitus without complication (HCC)    Diverticulosis    with hx of LGIB   Edema    GERD (gastroesophageal reflux disease)    GIB (gastrointestinal bleeding)    Gout    Heavy smoker    Hyperlipidemia    Hypertension    Iron deficiency anemia    Sleep apnea    uses a cpap   Swelling of lower extremity    Past Surgical History:  Procedure Laterality Date   CHONDROPLASTY Left 06/28/2014   Procedure: CHONDROPLASTY;  Surgeon: Thera Flake., MD;  Location: [facility];  Service: Orthopedics;  Laterality: Left;   COLONOSCOPY     FOOT FASCIOTOMY     age 30-rt   KNEE ARTHROSCOPY WITH LATERAL MENISECTOMY Left 06/28/2014   Procedure: KNEE ARTHROSCOPY WITH LATERAL MENISECTOMY;  Surgeon: Thera Flake., MD;  Location: [facility];  Service: Orthopedics;  Laterality: Left;   KNEE ARTHROSCOPY WITH MEDIAL MENISECTOMY Left 06/28/2014   Procedure: LEFT KNEE ARTHROSCOPY CHONDROPLASTY/WITH MEDIAL/LATERAL MENISECTOMIES;  Surgeon: Thera Flake., MD;  Location: [facility];  Service: Orthopedics;  Laterality: Left;   ORIF RADIUS & ULNA FRACTURES  2007   left   THORACIC DISCECTOMY N/A  03/12/2023   Procedure: THORACIC LAMINECTOMY AND  DISCECTOMY;  Surgeon: Coletta Memos, MD;  Location: [facility];  Service: Neurosurgery;  Laterality: N/A;   Patient Active Problem List   Diagnosis Date Noted   Cystitis 05/21/2023   Acute diverticulitis 05/21/2023   Vitamin D deficiency 05/14/2023   Low HDL (under 40) 05/14/2023   GERD (gastroesophageal reflux disease) 04/13/2023   Constipation 04/13/2023   Chronic back pain 04/13/2023   DVT, bilateral lower limbs (HCC) 04/13/2023   Coping style affecting medical condition 03/27/2023   Thoracic myelopathy with LE weakness 03/23/2023   HNP (herniated nucleus pulposus with myelopathy), thoracic 03/12/2023   Hyperlipidemia 12/24/2022   Failure to thrive in adult 12/24/2022   Tobacco abuse 03/18/2018   BPH associated with nocturia 03/11/2017   History of substance abuse (HCC) 03/11/2017   Restless leg syndrome 03/11/2017   Restrictive lung disease 05/14/2016   OSA on CPAP 05/14/2016   Moderate COPD (chronic obstructive pulmonary disease) (HCC) 05/14/2016   Smoking greater than 40 pack years 05/14/2016   SOB (shortness of breath) 10/06/2014   Edema of extremities 10/06/2014   Benign essential HTN 10/06/2014   Type 2 diabetes mellitus, without long-term current use of insulin (HCC) 07/18/2011    PCP: Venetia Constable, MD  REFERRING PROVIDER:  Charlton Amor,  PA-C   REFERRING DIAG: M47.14 (ICD-10-CM) - Other spondylosis with myelopathy, thoracic region   THERAPY DIAG:  Muscle weakness (generalized)  Unsteadiness on feet  Other symptoms and signs involving the musculoskeletal system  Chronic pain of right knee  Chronic pain of left knee  Stiffness of left knee, not elsewhere classified  Stiffness of right knee, not elsewhere classified  Rationale for Evaluation and Treatment: Rehabilitation  ONSET DATE: October 2024  SUBJECTIVE:                                                                                                                                                                                                          SUBJECTIVE STATEMENT: Patient arrives with pretty significant muscle spasms in BLE. He also reports that his legs feel tight. MRI scheduled for 07/22/23  From eval: Pt states he fell, broke his ankle and was found to later have a ruptured disc and had to get back surgery. Went to inpatient rehab x 3 weeks. Felt like he built up his upper body. Has now been home x 1 week. States he has not been doing much since then -- has done some sitting exercises. Was able to stand for 3 min bouts for a total of about 12 min using RW at inpatient rehab. Has history of bad knees and will be getting gel shots. 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 He was found to have large HNP T10/T11 and underwent T10 laminectomy with discectomy by Dr. Franky Macho on 03/12/2023   PAIN:  Are you having pain? No  PRECAUTIONS: None  RED FLAGS: None     WEIGHT BEARING RESTRICTIONS: No  FALLS:  Has patient fallen in last 6 months? Yes. Number of falls 1 -- last fall Oct 2024  LIVING ENVIRONMENT: Lives with: lives with their spouse Lives in: House/apartment Stairs:  ramp Has following equipment at home: Environmental consultant - 2 wheeled, Wheelchair (manual), and upright walker, hospital bed  OCCUPATION: Retired  PLOF:  Able to do his own ADLs but easier to have wife assist him  PATIENT GOALS: Walk with the walker at least household distances  NEXT MD VISIT: 04/21/23 [place]  OBJECTIVE:  Note: Objective measures were completed at Evaluation unless otherwise noted.  DIAGNOSTIC FINDINGS:  03/12/23 MRI THORACIC SPINE IMPRESSION:   1.  Moderate-sized lobulated left subarticular to foraminal disc protrusion at T10-11 with resultant severe spinal stenosis. Cord is compressed and deviated to the right at this level. Associated cord signal changes concerning for edema and/or myelomalacia. 2. No other acute abnormality within the thoracic spine. 3. Degenerative spondylosis at T8-9 through T11-12 without significant spinal stenosis. Associated moderate to severe bilateral foraminal narrowing at these levels as above.  MRI LUMBAR SPINE IMPRESSION:   1. No acute abnormality within the lumbar spine. 2. Multifactorial degenerative changes at L4-5 with resultant mild canal with severe left and moderate right lateral recess stenosis, with severe bilateral L4 foraminal narrowing. 3. Additional mild noncompressive disc bulging and facet hypertrophy elsewhere within the lumbar spine as above. No other significant stenosis or frank neural impingement.   Critical Value/emergent results were called by telephone at the time of interpretation on 03/12/2023 at 12:41 am to provider Dr. Madilyn Hook, who verbally acknowledged these results.  PATIENT SURVEYS:  FOTO 42; predicted 28  COGNITION: Overall cognitive status: Within functional limits for tasks assessed  SENSATION: WFL  POSTURE: rounded shoulders and forward head  PALPATION: No overt tenderness to palpation   CERVICAL ROM: WNL  LOWER EXTREMITY MMT:    MMT Right eval Left eval  Hip flexion 3+ 3  Hip extension 3+ 3+  Hip abduction 3+ 3+  Hip adduction    Hip internal rotation    Hip external rotation    Knee flexion 4- 3+  Knee extension 4- 3+  Ankle dorsiflexion    Ankle plantarflexion    Ankle inversion    Ankle eversion     (Blank rows = not tested)   FUNCTIONAL TESTS:  Transfers:  Chair to bed: Multiple seated scoots with UEs  Sit<>stand: max A -- limited due to knee pain Supine to sit<>sit: min A for trunk and LE negotiation. Not performing log  rolling  TODAY'S TREATMENT:                                                                                                                              DATE:  06/24/23 Supine with LE over physioball, attempted some lower trunk rotation, retrograde massage to legs, but his spasms seemed to increase. NuStep L4 x 6 minutes with strap to hold knees in better alignment, U and LE Performed multiple sliding board transfers, S after set up each time, much improved transfer ability.  06/22/23 Slideboard transfer to mat Seated rows green band 2x10  Seated fitter pushes 2x10 Seated thoracic rotations with yellow ball Attempted STS with chair in front but unable to stand up due to knees popping  Standing in parallel bars attempted 3x but unable to stand all the way up LAQ 2# 2x10 Seated marches 2# 2x10   06/17/23 NuStep L5 x 8 minutes, including multiple rest breaks due to SOB, U and LE. Performed multiple transfers including up grade and down grade with sliding board and S, after set up. Educated how to  position the sliding board so it does not bend the brake lever. Attempted sit to stand from elevated mat using RW. He was able to rise off the mat and come to nearly full stand, but unalbe to achieve full upright posture due to BLE fatigue and R knee pain.  06/15/23 NuStep- sliding board transfer up grade, CGA NuStep L3 x 5 minutes, strap around knees to hold better alignment. First with UE support, then legs only. Painful for knees. Status re-assessed for PN. See goals Standing in parallel bars- Able to achieve standing using BUE to push up. He die rely on the UE support in stance, attempted to facilitate decreased UE reliance. Able to reduce the use of UE slightly, but very unstable at knees.  06/08/23 Sliding board transfer up minimal grade to mat, S after A with board placement. Sit to supine, S Patient's legs began spasming, and he reported pain and poor sensory awareness of LEs in supine.  Elevated legs on physioball, with helped. Supine gentle DKTC, bridge, hip IR with 5 sec hold and bridge with ball roll, x 10 each to patient tolerance. Rolled to L side and attempted hip abd, but his spasms returned and were painful. Sup to sit with min A. He reported continued diminished sensory feedback.  06/01/23 New W/C adjustment- armrests raised to better support his elbows at rest Sliding board transfer to mat, min A for board placement, then S, improved scooting, still required many scoots. Sit to supine with S Repeated rolling to each side lifting the leg to cross body rather than push on mat, 5 x each way. Very challenging, but complete. Sup to sit with S. Sit to stand in parallel bars. First attempt, unable to stand, second and third attempts, reliant on UE support, but able to achieve full stance. X 10 sec. Mod A for standing  05/20/23 Assessed L shoulder due to increased pain. Negative full can, belly press, resisted ER, positive empty can, Hawkin's. TTP mildly over L supraspinatus muscle and tendon, biceps tendon. Sliding board transfer to mat with S, VC to push with legs to assist. Seated L shoulder distraction holding the edge of mat and leaning to R. Seated press ups for scapular stability. Moved sit to supine with S. His feet and ankles appear more swollen today. He attemtped to push into physioball to activate LE. However, his legs kept spasming, so this was deferred. Attempted sit to stand from elevated mat, but he was unable to raise hips today.  05/14/23 Transfer to mat, Challenging for level transfer, but I Sit to Supine Mi, improved ability to lift legs to surface Supine with legs over G physioball- bridge, DKTC, quick side to side rotations, alternating lifting one leg off ball to mat and back up, 10 reps each. Supine to long sit, then scoot hips forward to the edge of mat, very challenging, but able to accomplish with VC from therapist for correct technique, lean  and scoot. Fitter press, 1 x 10 with 1 blue band, then 1 x 5 with 2 blue bands, focus Standing in parallel bars from elevated W/C seat. Pushes up from bars with min A and extra time, still largely reliant on UE support, but trying to move and relax arms to rely on his legs more. Stood 3 x 30 sec, CGA once up with close guarding at knees in case of buckling.  05/11/23 W/C > mat scoot transfer with S. Difficulty getting started, but completed without physical A. Moved into longsit on mat,  then scooted over and backwards. Performed multiple movements on the mat in order to facilitate active trunk stability, including prone lying, attempted to move to quadruped, but his knees kept slipping back as he tried to push up. Rolled to R and performed L hip abd, repeated on L sidelie for R hip. Sup to sit on edge of mat with much less effort required today, S Sit<> partial stand from elevated mat with hands on chair in front of him, able to come to stand with hands on seat, then place hands on the armrests of the chair. 3 reps. Sit to stand with mod A to initiate and given extra time to rise. Parallel bars- Sit to stand from W/C with Airex pad in seat to elevate slightly. Pushed up on parallel bars. Able to achieve fully upright stance and perform lat weight shifts, but had difficulty once he tried to lift either foot to simulate taking a step, poor control of limb in space.  05/07/23 Sliding board transfer to mat using lean technique with shoulders away from hips to initiate. He then was able to lift his hips and scoot the rest of the way. Supine bridging x 10, able to clear hips Repeated roll to each side x 3 using trunk into flexion. Fitter press with 2 blue bands x 10 each leg, very challenging. Functional trunk and LE strengthening, seated on mat, turn to R, walking both hands away from trunk, then lifting L LE to touch heel to surface of mat. X 5 lifts to each side, very challenging. Sit<> partial stand  from elevated mat with hands on chair in front of him, able to come to stand with hands on seat, then place hands on the armrests of the chair. Moved to parallel bars with Air ex pad on top of his W/C cushion to elevate him. Sit > Stand pulling bars, with mod A and time to push into full upright posture where he stood x approx 30 seconds. Relies on arms to A.  05/06/23 Lateral transfers from W/C <> mat with min A. Sit to supine with light min A to BLE Rolled to L- Therapist facilitated lower trunk active mobilization in ant/post and inf/sup planes of movement. Moved to R LE with acitve hip abd, very weak, tends to turn and use quads, clamshell also required mod A. Facilitated repeated rolling using normalized technique with RLE and UE held in the air to engage trunk. Very challenging, but able to complete x 4. Repeated all activities in R side lying. Moved from Sup > long sit I, but did report some strain in back Sit <> partial stand from elevated mat, hands on the seat or armrests of a chair facing him, required +2 to initiate due to B knee pain, but he was able to rise into bent over standing position x5 and hold for up to 5 seconds. B scoot transfers, emphasizing lifting hips off the surface by pushing through legs, x 5 each way on mat Transfer back to W/C with CGA. Mat > W/C with CGA  04/23/23 Seated heel slide 2x10 Transfer x2; min A to stabilize w/c while getting to bed. Pt utilizing multiple scoots and UEs SLR short range x10 (>10 deg extensor lag bilat) Quad set 2x10 SAQ 2# ankle weight 2x10 Supine clamshell green TB 2x10 Bridge 2x10 Supine to sit log roll with SBA for v/cs In // bars pulling up for 1/4 to standing 2x5  In w/c pushing with legs only 2x10' In w/c pulling  with legs only 2x10'  04/21/23 See HEP below   PATIENT EDUCATION:  Education details: Exam findings, POC, initial HEP Person educated: Patient Education method: Explanation, Demonstration, and  Handouts Education comprehension: verbalized understanding, returned demonstration, and needs further education  HOME EXERCISE PROGRAM: Access Code: [email or web address] URL: [email or web address] Date: 04/21/2023 Prepared by: Vernon Prey April Kirstie Peri  Exercises - Staggered Bridge  - 1 x daily - 7 x weekly - 2 sets - 10 reps - Hooklying Isometric Clamshell  - 1 x daily - 7 x weekly - 2 sets - 10 reps - Small Range Straight Leg Raise  - 1 x daily - 7 x weekly - 2 sets - 10 reps  ASSESSMENT:  CLINICAL IMPRESSION: Patient's muscle spasms appear to be increasing in spite of medication adjustments. He also feels his legs are swollen. Attempted treatment activities to address, but they did not seem to decrease spasms. Pedalling NuStep did seem to decrease LE muscle spasms, possibly due to the Wbing.  From eval: Patient is a 58 y.o. M who was seen today for physical therapy evaluation and treatment for thoracic myelopathy. PMH significant for bilat knee OA (pt to get gel shots and is interested in future TKA once he gets his weight down) and T10 laminectomy with discectomy by Dr. Franky Macho on 03/12/2023. Went to inpatient rehab x 3 weeks. Has now been at home x 1 week. Pt states he does not currently have any known precautions. Assessment significant for gross bilat LE weakness (L LE weaker than R) affecting standing tolerance, transfers and balance. Pt is limited due to his knee pain. Pt will highly benefit from PT to address these deficits to reach pt's goals for increased mobility and amb.   OBJECTIVE IMPAIRMENTS: Abnormal gait, decreased balance, decreased endurance, decreased mobility, difficulty walking, decreased ROM, decreased strength, increased edema, improper body mechanics, postural dysfunction, obesity, and pain.   GOALS: Goals reviewed with patient? Yes  SHORT TERM GOALS: Target date: 04/15/24   Pt will be ind with initial HEP Baseline:  Goal status: 05/06/23- provided, but  inconsistent in performing. Ongoing  2.  Pt will be able to tolerate standing x 1 min in the parallel bars with BUE support and CGA Baseline: 06/01/23- Goal updated after recent hospitalization Goal status: 06/15/23- Stands x up to 30 sec with BUE support in parallel bars, Improved ability to slide hands with slightly less reliance on UE support.ongoing  3.  Pt will be able to perform sliding board transfer with S, including set up, and up a slight (3") grade Baseline: 06/01/23- Goal updated after recent hospitalization Goal status: 06/15/23- Min A for set up, S for level transfer, required mult scoots, improved excursion with shifts.   LONG TERM GOALS: Target date: 07/14/2023   Pt will be ind with management and progression of HEP Baseline:  Goal status: INITIAL  2.  Pt will be able to amb at least 20' with RW min A for home mobility Baseline:  Goal status: 06/15/23- Stands x up to 30 sec with BUE support in parallel bars, Improved ability to slide hands with slightly less reliance on UE support.ongoing  3.  Pt will be able to perform at least 2 reps of sit to stand in 30 sec with UE support Baseline: Unable to perform complete STS without mod A Goal status: 06/15/23-Unable to stand without UE support, ongoing  4.  Pt will have increased FOTO to >/=54 Baseline:  Goal status: 06/15/23-26.8, ongoing  PLAN:  PT FREQUENCY: 2x/week  PT DURATION: 12 weeks  PLANNED INTERVENTIONS: 97164- PT Re-evaluation, 97110-Therapeutic exercises, 97530- Therapeutic activity, [id number]- Neuromuscular re-education, 97535- Self Care, 16109- Manual therapy, [phone]- Gait training, [phone]- Orthotic Fit/training, [phone]- Aquatic Therapy, 97014- Electrical stimulation (unattended), 97035- Ultrasound, [id number]- Ionotophoresis 4mg /ml Dexamethasone, Patient/Family education, Balance training, Stair training, Taping, Dry Needling, Joint mobilization, Spinal mobilization, Cryotherapy, and Moist heat  PLAN FOR NEXT SESSION:  Assess response to HEP. Continue to work on LE strengthening and transfers/standing.   Oley Balm DPT 06/24/23 6:52 PM

## 2023-06-24 NOTE — Therapy (Signed)
OUTPATIENT OCCUPATIONAL THERAPY NEURO Treatment  Patient Name: Raymond [name] MRN: [id number] DOB:1966/05/30, 58 y.o., male Today's Date: 06/24/2023  PCP: Dr. Tawanna Solo PROVIDER: Dr. Carlis Abbott  END OF SESSION:  OT End of Session - 06/24/23 1458     Visit Number 12    Number of Visits 32    Date for OT Re-Evaluation 08/24/23    Authorization Type cigna    Authorization Time Period 12 weeks    OT Start Time 1232    OT Stop Time 1313    OT Time Calculation (min) 41 min    Activity Tolerance Patient tolerated treatment well    Behavior During Therapy WFL for tasks assessed/performed                    Past Medical History:  Diagnosis Date   Acute kidney injury superimposed on chronic kidney disease (HCC) 04/13/2023   Acute on chronic anemia 04/13/2023   Anemia    Anxiety    Back pain    COPD (chronic obstructive pulmonary disease) (HCC)    Depression    Diabetes mellitus without complication (HCC)    Diverticulosis    with hx of LGIB   Edema    GERD (gastroesophageal reflux disease)    GIB (gastrointestinal bleeding)    Gout    Heavy smoker    Hyperlipidemia    Hypertension    Iron deficiency anemia    Sleep apnea    uses a cpap   Swelling of lower extremity    Past Surgical History:  Procedure Laterality Date   CHONDROPLASTY Left 06/28/2014   Procedure: CHONDROPLASTY;  Surgeon: Thera Flake., MD;  Location: [facility];  Service: Orthopedics;  Laterality: Left;   COLONOSCOPY     FOOT FASCIOTOMY     age 52-rt   KNEE ARTHROSCOPY WITH LATERAL MENISECTOMY Left 06/28/2014   Procedure: KNEE ARTHROSCOPY WITH LATERAL MENISECTOMY;  Surgeon: Thera Flake., MD;  Location: [facility];  Service: Orthopedics;  Laterality: Left;   KNEE ARTHROSCOPY WITH MEDIAL MENISECTOMY Left 06/28/2014   Procedure: LEFT KNEE ARTHROSCOPY CHONDROPLASTY/WITH MEDIAL/LATERAL MENISECTOMIES;  Surgeon: Thera Flake., MD;  Location: [facility];  Service: Orthopedics;  Laterality: Left;   ORIF RADIUS & ULNA FRACTURES  2007   left   THORACIC DISCECTOMY N/A 03/12/2023   Procedure: THORACIC LAMINECTOMY AND  DISCECTOMY;  Surgeon: Coletta Memos, MD;  Location: [facility];  Service: Neurosurgery;  Laterality: N/A;   Patient Active Problem List   Diagnosis Date Noted   Cystitis 05/21/2023   Acute diverticulitis 05/21/2023   Vitamin D deficiency 05/14/2023   Low HDL (under 40) 05/14/2023   GERD (gastroesophageal reflux disease) 04/13/2023   Constipation 04/13/2023   Chronic back pain 04/13/2023   DVT, bilateral lower limbs (HCC) 04/13/2023   Coping style affecting medical condition 03/27/2023   Thoracic myelopathy with LE weakness 03/23/2023   HNP (herniated nucleus pulposus with myelopathy), thoracic 03/12/2023   Hyperlipidemia 12/24/2022   Failure to thrive in adult 12/24/2022   Tobacco abuse 03/18/2018   BPH associated with nocturia 03/11/2017   History of substance abuse (HCC) 03/11/2017   Restless leg syndrome 03/11/2017   Restrictive lung disease 05/14/2016   OSA on CPAP 05/14/2016   Moderate COPD (chronic obstructive pulmonary disease) (HCC) 05/14/2016   Smoking greater than 40 pack years 05/14/2016   SOB (shortness of breath) 10/06/2014   Edema of extremities 10/06/2014   Benign essential  HTN 10/06/2014   Type 2 diabetes mellitus, without long-term current use of insulin (HCC) 07/18/2011    ONSET DATE: 04/08/23- referral date  REFERRING DIAG:  Diagnosis  M47.14 (ICD-10-CM) - Other spondylosis with myelopathy, thoracic region    THERAPY DIAG:  Muscle weakness (generalized) - Plan: Ot plan of care cert/re-cert,   Stiffness of right knee, not elsewhere classified - Plan: Ot plan of care cert/re-cert,   Other symptoms and signs involving the musculoskeletal system - Plan: Ot plan of care cert/re-cert,   Unsteadiness on feet - Plan: Ot plan of care cert/re-cert,   Other abnormalities of gait and mobility -  Plan: Ot plan of care cert/re-cert,   Pain in left upper arm - Plan: Ot plan of care cert/re-cert  Pain in right upper arm - Plan: Ot plan of care cert/re-cert  Stiffness of left shoulder, not elsewhere classified - Plan: Ot plan of care cert/re-cert  Stiffness of right shoulder, not elsewhere classified - Plan: Ot plan of care cert/re-cert  Rationale for Evaluation and Treatment: Rehabilitation  SUBJECTIVE:   SUBJECTIVE STATEMENT: Pt reports  shoulder pain this a.m.  Pt accompanied by: self  PERTINENT HISTORY: 58 y.o. male presenting with increased low back pain after a fall. Pt was found to have thoracic myelopathy due to a disc herniation at T10/11. He is now s/p T10 laminectomy and Discectomy. Pt received therapies at [facility] and he d/c home 03/23/23 PMH of anxiety, COPD, DM, Edema, Gout, HTN, HLD, L medial menisectomy, ORIF L radius and ulna.  PRECAUTIONS: Back  WEIGHT BEARING RESTRICTIONS: No  PAIN:  Are you having pain? Yes: NPRS scale: 4/10 Pain location: shoulder, biceps tendon bilateral UE's Pain description: aching Aggravating factors: sitting still Relieving factors: meds  FALLS: Has patient fallen in last 6 months? Yes. Number of falls 1  LIVING ENVIRONMENT: Lives with: lives with their spouse Lives in: House/apartment Stairs:  has ramp Has following equipment at home: Walker - 2 wheeled and bed side commode  PLOF: Independent with basic ADLs  PATIENT GOALS: increase I with ADLS/ADLS  OBJECTIVE:  Note: Objective measures were completed at Evaluation unless otherwise noted.  HAND DOMINANCE: Right  ADLs: Overall ADLs: increased time required, uses w/c, pt is not able to ambulate or stand for ADLs at this time Transfers/ambulation related to ADLs: Eating: mod I Grooming: mod I UB Dressing: setup LB Dressing: mod A,  Toileting: uses bedside commode supervision-min A for scooting Bathing: sponge bathing, min A per pt report Tub Shower transfers: n/a   Equipment: bed side commode Unable to get into bathroom with w/c  IADLs: dependent with IADLS   MOBILITY STATUS:  supervision to min A for transfers  -scooting from w/c, pt is unable to stand for ADLs  Activitiy tolerance: 45 mins to hour with rest break    UPPER EXTREMITY ROM:    Active ROM Right eval Left eval  Shoulder flexion 90 110  Shoulder abduction 90 100  Shoulder adduction    Shoulder extension    Shoulder internal rotation    Shoulder external rotation    Elbow flexion [facility] WFL  Elbow extension [facility] WFL  Wrist flexion    Wrist extension    Wrist ulnar deviation    Wrist radial deviation    Wrist pronation    Wrist supination  90%  (Blank rows = not tested)  UPPER EXTREMITY MMT:     MMT Right eval Left eval  Shoulder flexion 3+/5 4/5  Shoulder abduction  Shoulder adduction    Shoulder extension    Shoulder internal rotation    Shoulder external rotation    Middle trapezius    Lower trapezius    Elbow flexion 4/5 4+/5  Elbow extension 4//5 4+/5  Wrist flexion    Wrist extension    Wrist ulnar deviation    Wrist radial deviation    Wrist pronation    Wrist supination    (Blank rows = not tested)  HAND FUNCTION: Grip strength: Right: 72 lbs; Left: 75 lbs  COORDINATION: 9 Hole Peg test: Right: 23.21 sec; Left: 32.52 sec  SENSATION: WFL      COGNITION: Overall cognitive status: Within functional limits for tasks assessed    OBSERVATIONS: Pleasant male is highly motivated to improve   TODAY'S TREATMENT:     1/22/25Ultrasound 1 mhz, 1.0 w/cm 2, 20%  x 7 mins to right shoulder , no adverse reactions.  Ultrasound  to left shoulder/ , 1.0 w/cm 2, 20% x 7 mins for pain relief no adverse reactions.     Closed chain chest press and internal/ external rotation, pt perfromed without reports of pain Pt with 1 remaining scab on right shoulder, this area was avoided with application of ionto. Pt with several small areas of mild pink  coloration from previous ionto, ionto patches were moved to avoid these areas.  Iontophoresis Patch Set-up and Applied.  No adverse reactions observed while in clinic.  Type: Desamethasone Location: bilateral shoulders Dose: 52mA/min, 1mL patch Time: 4hr patch- pt was instructed when to remove Pt was monitored in clinic x 5 mins, no adverse reactions Therapist discussed with pt the improtance of activity modification and minimizing stress on the shoulders as much as possible.  06/22/23-Ultrasound 1 mhz, 1.0 w/cm 2, 20%  x 7 mins to right shoulder and biceps, no adverse reactions.  Ultrasound  to left shoulder/ biceps , 1.0 w/cm 2, 20% x 7 mins for pain relief no adverse reactions.    Pt reports decreased pain following [place]. Closed chain chest press and internal external rotation, pt perfromed without reports of pain Pt with 1 remaining scab on right shoulder, this area was avoided with application of ionto. Iontophoresis Patch Set-up and Applied.  No adverse reactions observed while in clinic.  Type: Desamethasone Location: bilateral shoulders Dose: 46mA/min, 1mL patch Time: 4hr patch Pt was monitored in clinic x 5 mins, no adverse reactions   06/17/23 Pt attempted closed chain shoulder flexion with cane however this increased pain so discontinued. Internal external rotation with cane, 20 reps mod v.c no increased pain Tableslides for shoulder flexion bilateral UE's no pain 10-20 reps as well as shoulder horizontal abduction, min v.c. Pt reports his shoulders feel better today s/p ionto yesterday. Pt with one scab from small blister on right anterior shoulder, therapist avoided this area when applying ionto today. Iontophoresis Patch Set-up and Applied.  No adverse reactions observed while in clinic.  Type: Desamethasone Location: bilateral shoulders Dose: 28mA/min, 1mL patch Time: 4hr patch Pt was moniored in the clinic x 10 mins s/p application while therpist discussed ADL strategies  with pt. He was instructed to remove the patch in 4 hrs at 6:30. Pt practiced donning/ doffing shoe with reacher and shoe horn. Pt completed task with max difficulty, min v.c.  06/16/23  Ultrasound 1 mhz, 8.0 w/cm 2, 20%  x 8 mins to right shoulder and biceps, no adverse reactions.  Ultrasound  to left shoulder/ biceps , 1.0 w/cm 2, 20% x 8 mins for pain relief no adverse reactions.      Iontophoresis Patch Set-up and Applied.  No adverse reactions observed while in clinic.  Type: Desamethasone Location: bilateral shoulders Dose: 49mA/min, 1mL patch Time: 4hr patch Pt was moniored in the clinic x 5 mins s/p application. He was instructed to remove the patch in 4 hrs at 1:30.                                                                                                          06/04/23:    Ultrasound 1 mhz, 1.0 w/cm 2, 20%  x 8 mins to right shoulder and biceps, no adverse reactions.  Ultrasound  to left shoulder, , 1.0 w/cm 2, 20% x 8 mins for pain relief no adverse reactions.   Closed-chain shoulder flex with BUEs in pain-free range with min cueing for positioning/sholder hike  Iontophoresis Patch Set-up and Applied.  No adverse reactions observed while in clinic.  Pt/wife instructed in Ionto instructions/precautions and contraindications Type: Desamethasone Location: bilateral shoulders Dose: 35mA/min, 1mL patch Time: 4hr patch    06/01/23 Therapist checked progress towards goals as pt was recently hospitalize for diverticulitis.  Pt demonstrates bilateral shoulder flexion to 70* prior to significant pain. He reports increased bilateral shoulder pain and he has been cautioned to limit repetative use of bilateral UE's and to avoid biceps curls. [place] 1 mhz, 1.0 w/cm 2, 20%  x 8 mins to right shoulder and biceps, no adverse reactions. Ice pack applied to R shoulder x 7 mins while pt  recived UE to left shoulder. No adverse reactions. Pt reports he is nearly pain free afterwards. [place] to left shoulder, , 1.0 w/cm 2, 20% x 7 mins for pain relief no adverse reactions. Therapist discussed use of Iontophoreis with pt and he is agreeable. Therapist to request order.  05/20/23- US:1- mhz,  1.0 w/cm 2, 50%, x 10 mins  for L biceps tendon and biceps and left lateral/ anterior shoulder and left shoulder pain, no adverse reactions  Ice pack applied to left shoulder x 8 min  Pt reports pain was improved to 2-3 /10 at end of session  05/14/23-UBE x 6 mins level 3 for conditioning Pt reports difficulty with bathing his bottom and hygine. Therapist discussed with pt/ wife and provided information regarding toilet aide, handheld bidet and long handled sponge. therapist recommends pt does as much as he can then has assistnace from his wife for thoroughness. Therapist also discussed with pt/ wife activies pt can perform at home at w/c level: folding clothes, wiping kitchen counter, and retieveing a snack for the fridge. Closed chain shoulder flexion with cane, to 90*, 20 reps min v.c Biceps curls and triceps extension 15 reps each, min v.c  05/11/23-UBE x 6 mins level 2 for conditioning Biceps curls 2 sets of 10-15 reps with 2 lbs hand  weights, triceps ext with red band, min v.c 15 reps each UE Closed chain shoulder flexion with cane 15 reps to shoulder height, min v.c to avoid shoulder hike, then 15 reps with 2 lbs cane  Pt practiced scooting along edge of mat with emphasis on lifting up with arms and legs before scoot, min v.c Pt simulated donning pants with reacher and min v.c and demonstration. Pt transferred back to w/c via sliding board with supervision min-mod v.c  05/07/23 UBE x 6 mins level 3 for conditioning Pt was instructed in updates to HEP, for bilateral UE's min v.c and demonstration 10 reps each, red band and cane were used see pt instuctions.  05/06/23- UBE x 6 mins level 2  for conditioning, seated in w/c Pt transferred scoot pivot without sliding board to mat with supervision Seated on mat closed chain shoulder flexion and biceps curls 10 reps each, min v.c  Biceps curls x 20 reps with 3lbs weight bilateral UE's, min v.c shoulder flexion bilateal UE's with 2 lbs weight in each hand, 15 reps. Pt practiced scooting on mat with cueing to shift weight fowrard and pushdown through hands to scoot, pt also practiced using this technique for a sliding board transfer back to w/c. min v.c Pt's wife rports pt an't go into the kitchen because he can't remove his leg rests. Pt practiced removing and reattaching w/c leg rests with min v.c the pt practiced walking his w/c forwards with legs while assisting aby pushing wheels. see education  04/22/23 eval  PATIENT EDUCATION: Education details:    Iontophoresis Instructions, Precautions/Contraindications.  Person educated: Patient  Education method: Explanation, Verbal cues,  Education comprehension: verbalized understanding,  HOME EXERCISE PROGRAM:  red band and vertical cane 05/07/23   GOALS: Goals reviewed with patient? Yes  SHORT TERM GOALS: Target date: 07/01/22  I with inital HEP.  Goal status: ongoing, 06/01/23  2.  Pt will peform LB dressing with min A  Goal status: pt reports donning pants with min A   3.  Pt will consistently perform toilet transfers with supervision.  Goal status: ongoing, supervision with transfer, min A hygeine  4.  Pt will perform snack or beverage prep at a w/c level  Goal status: ongoing, dependent 06/01/23  5. Pt will verbalize understanding of back precautions.  Goal status: ongoing, 06/01/23, reveiwed BAT, however pt needs reinforcement    LONG TERM GOALS: Target date: 08/24/22  I with updated HEP. Baseline:  Goal status:  ongoing  2.  Pt will perform bathing with supervision/ set up  Goal status:  ongoing  3.  Pt will perform LB dressing with supervision/ set  up.  Goal status: I ongoing  4.  Pt will demonstrate ability with stand for 5 mins with min A for ADLs/ IADLs.  Goal status:  ongoing  5.  Pt will perfrom simple home management at a walker level with min A.  Goal status:  ongoing   ASSESSMENT:    CLINICAL IMPRESSION: Pt is progressing towards goals. He is responding well to iontophoresis, with decreased  overall pain.Today was treatment #5. Pt reports that  his transfers continue to aggravate shoulders.   PERFORMANCE DEFICITS: in functional skills including ADLs, IADLs, ROM, strength, pain, flexibility, mobility, balance, body mechanics, endurance, decreased knowledge of precautions, decreased knowledge of use of DME, and UE functional use,  and psychosocial skills including coping strategies, environmental adaptation, habits, interpersonal interactions, and routines and behaviors.   IMPAIRMENTS: are limiting patient from ADLs, IADLs, rest  and sleep, work, play, leisure, and social participation.   CO-MORBIDITIES: [place] have co-morbidities  that affects occupational performance. Patient will benefit from skilled OT to address above impairments and improve overall function.  MODIFICATION OR ASSISTANCE TO COMPLETE EVALUATION: No modification of tasks or assist necessary to complete an evaluation.  OT OCCUPATIONAL PROFILE AND HISTORY: Detailed assessment: Review of records and additional review of physical, cognitive, psychosocial history related to current functional performance.  CLINICAL DECISION MAKING: LOW - limited treatment options, no task modification necessary  REHAB POTENTIAL: Good  EVALUATION COMPLEXITY: Low    PLAN:  OT FREQUENCY: 2x/week  OT DURATION: 12 weeks plus eval  PLANNED INTERVENTIONS: 97168 OT Re-evaluation, 97535 self care/ADL training, 01601 therapeutic exercise, 97530 therapeutic activity, 97112 neuromuscular re-education, 97140 manual therapy, 97116 gait training, 09323 aquatic therapy, 97035  ultrasound, 97018 paraffin, 55732 moist heat, 97010 cryotherapy, passive range of motion, balance training, stair training, functional mobility training, psychosocial skills training, energy conservation, coping strategies training, patient/family education, and DME and/or AE instructions  RECOMMENDED OTHER SERVICES: PT  CONSULTED AND AGREED WITH PLAN OF CARE: Patient  PLAN FOR NEXT SESSION:   [place],  continue iontophoresis #6 ,ADL strategies   Avyukt Cimo, OTR/L 06/24/2023, 2:58 PM

## 2023-06-29 ENCOUNTER — Ambulatory Visit: Payer: PRIVATE HEALTH INSURANCE

## 2023-06-30 ENCOUNTER — Encounter: Payer: Self-pay | Admitting: Rehabilitation

## 2023-06-30 ENCOUNTER — Ambulatory Visit (INDEPENDENT_AMBULATORY_CARE_PROVIDER_SITE_OTHER): Payer: No Typology Code available for payment source | Admitting: Bariatrics

## 2023-06-30 ENCOUNTER — Ambulatory Visit: Payer: PRIVATE HEALTH INSURANCE | Admitting: Rehabilitation

## 2023-06-30 ENCOUNTER — Encounter: Payer: Self-pay | Admitting: Bariatrics

## 2023-06-30 VITALS — BP 167/66 | HR 68 | Temp 97.7°F | Ht 71.0 in | Wt 342.0 lb

## 2023-06-30 DIAGNOSIS — G8929 Other chronic pain: Secondary | ICD-10-CM

## 2023-06-30 DIAGNOSIS — M6281 Muscle weakness (generalized): Secondary | ICD-10-CM

## 2023-06-30 DIAGNOSIS — I10 Essential (primary) hypertension: Secondary | ICD-10-CM

## 2023-06-30 DIAGNOSIS — F17210 Nicotine dependence, cigarettes, uncomplicated: Secondary | ICD-10-CM

## 2023-06-30 DIAGNOSIS — J984 Other disorders of lung: Secondary | ICD-10-CM | POA: Diagnosis not present

## 2023-06-30 DIAGNOSIS — R29898 Other symptoms and signs involving the musculoskeletal system: Secondary | ICD-10-CM

## 2023-06-30 DIAGNOSIS — Z6841 Body Mass Index (BMI) 40.0 and over, adult: Secondary | ICD-10-CM

## 2023-06-30 DIAGNOSIS — R2681 Unsteadiness on feet: Secondary | ICD-10-CM

## 2023-06-30 NOTE — Therapy (Signed)
OUTPATIENT PHYSICAL THERAPY CERVICAL/THORACIC TREATMENT     Patient Name: Raymond [name] MRN: [id number] DOB:06/10/1965, 58 y.o., male Today's Date: 06/30/2023  END OF SESSION:  PT End of Session - 06/30/23 0805     Visit Number 14    Number of Visits 25    Date for PT Re-Evaluation 07/27/23    Authorization Type [employer] Managed    PT Start Time [phone]    PT Stop Time 0935    PT Time Calculation (min) 45 min    Equipment Utilized During Treatment Gait belt   floatation devices as needed for safety   Activity Tolerance Patient tolerated treatment well;Patient limited by pain    Behavior During Therapy WFL for tasks assessed/performed            Past Medical History:  Diagnosis Date   Acute kidney injury superimposed on chronic kidney disease (HCC) 04/13/2023   Acute on chronic anemia 04/13/2023   Anemia    Anxiety    Back pain    COPD (chronic obstructive pulmonary disease) (HCC)    Depression    Diabetes mellitus without complication (HCC)    Diverticulosis    with hx of LGIB   Edema    GERD (gastroesophageal reflux disease)    GIB (gastrointestinal bleeding)    Gout    Heavy smoker    Hyperlipidemia    Hypertension    Iron deficiency anemia    Sleep apnea    uses a cpap   Swelling of lower extremity    Past Surgical History:  Procedure Laterality Date   CHONDROPLASTY Left 06/28/2014   Procedure: CHONDROPLASTY;  Surgeon: Thera Flake., MD;  Location: [facility];  Service: Orthopedics;  Laterality: Left;   COLONOSCOPY     FOOT FASCIOTOMY     age 45-rt   KNEE ARTHROSCOPY WITH LATERAL MENISECTOMY Left 06/28/2014   Procedure: KNEE ARTHROSCOPY WITH LATERAL MENISECTOMY;  Surgeon: Thera Flake., MD;  Location: [facility];  Service: Orthopedics;  Laterality: Left;   KNEE ARTHROSCOPY WITH MEDIAL MENISECTOMY Left 06/28/2014   Procedure: LEFT KNEE ARTHROSCOPY CHONDROPLASTY/WITH MEDIAL/LATERAL MENISECTOMIES;  Surgeon: Thera Flake., MD;   Location: [facility];  Service: Orthopedics;  Laterality: Left;   ORIF RADIUS & ULNA FRACTURES  2007   left   THORACIC DISCECTOMY N/A 03/12/2023   Procedure: THORACIC LAMINECTOMY AND  DISCECTOMY;  Surgeon: Coletta Memos, MD;  Location: [facility];  Service: Neurosurgery;  Laterality: N/A;   Patient Active Problem List   Diagnosis Date Noted   Cystitis 05/21/2023   Acute diverticulitis 05/21/2023   Vitamin D deficiency 05/14/2023   Low HDL (under 40) 05/14/2023   GERD (gastroesophageal reflux disease) 04/13/2023   Constipation 04/13/2023   Chronic back pain 04/13/2023   DVT, bilateral lower limbs (HCC) 04/13/2023   Coping style affecting medical condition 03/27/2023   Thoracic myelopathy with LE weakness 03/23/2023   HNP (herniated nucleus pulposus with myelopathy), thoracic 03/12/2023   Hyperlipidemia 12/24/2022   Failure to thrive in adult 12/24/2022   Tobacco abuse 03/18/2018   BPH associated with nocturia 03/11/2017   History of substance abuse (HCC) 03/11/2017   Restless leg syndrome 03/11/2017   Restrictive lung disease 05/14/2016   OSA on CPAP 05/14/2016   Moderate COPD (chronic obstructive pulmonary disease) (HCC) 05/14/2016   Smoking greater than 40 pack years 05/14/2016   SOB (shortness of breath) 10/06/2014   Edema of extremities 10/06/2014   Benign essential HTN  10/06/2014   Type 2 diabetes mellitus, without long-term current use of insulin (HCC) 07/18/2011    PCP: Venetia Constable, MD  REFERRING PROVIDER:  Charlton Amor, PA-C   REFERRING DIAG: [phone] (ICD-10-CM) - Other spondylosis with myelopathy, thoracic region   THERAPY DIAG:  Muscle weakness (generalized)  Unsteadiness on feet  Other symptoms and signs involving the musculoskeletal system  Chronic pain of right knee  Chronic pain of left knee  Rationale for Evaluation and Treatment: Rehabilitation  ONSET DATE: October 2024  SUBJECTIVE:                                                                                                                                                                                                          SUBJECTIVE STATEMENT: Pt presents to pool for first aquatic session today.  Reports doing good, is nervous but excited about getting in the pool.  Knees are painful today but they are everyday per pt report.  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 He was found to have large HNP T10/T11 and underwent T10 laminectomy with discectomy by Dr. Franky Macho on 03/12/2023   PAIN:  Are you having pain? No  PRECAUTIONS: None  RED FLAGS: None     WEIGHT BEARING RESTRICTIONS: No  FALLS:  Has patient fallen in last 6 months? Yes. Number of falls 1 -- last fall Oct 2024  LIVING ENVIRONMENT: Lives with: lives with their spouse Lives in: House/apartment Stairs:  ramp Has following equipment at home: Environmental consultant - 2 wheeled, Wheelchair (manual), and upright walker, hospital bed  OCCUPATION: Retired  PLOF:  Able to do his own ADLs but easier to have wife assist him  PATIENT GOALS: Walk with the walker at least household distances  NEXT MD VISIT: 04/21/23 [place]  OBJECTIVE:  Note: Objective measures were completed at Evaluation unless otherwise noted.  DIAGNOSTIC FINDINGS:  03/12/23 MRI THORACIC SPINE IMPRESSION:   1. Moderate-sized lobulated left subarticular to foraminal disc protrusion at T10-11 with resultant severe spinal stenosis. Cord is compressed and deviated to the right at this level. Associated cord signal changes concerning for edema and/or myelomalacia. 2. No other acute abnormality within the thoracic spine. 3. Degenerative spondylosis at T8-9 through T11-12 without significant spinal stenosis.  Associated moderate to severe bilateral foraminal narrowing at these levels as above.  MRI LUMBAR SPINE IMPRESSION:  1. No acute abnormality within the lumbar spine. 2. Multifactorial degenerative changes at L4-5 with resultant mild canal with severe left and moderate right lateral recess stenosis, with severe bilateral L4 foraminal narrowing. 3. Additional mild noncompressive disc bulging and facet hypertrophy elsewhere within the lumbar spine as above. No other significant stenosis or frank neural impingement.   Critical Value/emergent results were called by telephone at the time of interpretation on 03/12/2023 at 12:41 am to provider Dr. Madilyn Hook, who verbally acknowledged these results.  PATIENT SURVEYS:  FOTO 42; predicted 21  COGNITION: Overall cognitive status: Within functional limits for tasks assessed  SENSATION: WFL  POSTURE: rounded shoulders and forward head  PALPATION: No overt tenderness to palpation   CERVICAL ROM: WNL  LOWER EXTREMITY MMT:    MMT Right eval Left eval  Hip flexion 3+ 3  Hip extension 3+ 3+  Hip abduction 3+ 3+  Hip adduction    Hip internal rotation    Hip external rotation    Knee flexion 4- 3+  Knee extension 4- 3+  Ankle dorsiflexion    Ankle plantarflexion    Ankle inversion    Ankle eversion     (Blank rows = not tested)   FUNCTIONAL TESTS:  Transfers:  Chair to bed: Multiple seated scoots with UEs  Sit<>stand: max A -- limited due to knee pain Supine to sit<>sit: min A for trunk and LE negotiation. Not performing log rolling  TODAY'S TREATMENT:                                                                                                                              DATE:   06/30/23  Patient seen for aquatic therapy today.  Treatment took place in water 3.6-4.0 feet deep depending upon activity.  Pt entered and exited the pool via chair lift; pt was transferred from his wheelchair to pool chair lift with mod A (+2 for  safety) assist using slideboard when getting out of pool but without board getting onto lift chair.  Pool temp approx 92 deg.   Once in water, had pt grab onto wall for support while PT elevated lift chair back out of the water.  Secondary PT assisted floating to deeper end of pool as pt is tall and to make standing easier for pt.  Once in 4' water, assisted with getting feet under him and had him work to stand fully which he is able to do.  Provided pt with water walker and with +2A for safety (one PT ant/one PT post to pt) had him ambulate across pool x 6 laps with cues for ensuring trunk remains over feet and to "push down" through legs throughout, esp with turns as he initially would float during turn.  Pt with LE scissoring which seems to be spasticity related but this improved throughout session as well as knee pain decreased with increased walking.    Then placed him in float on secondary therapist while  primary PT retrieved and placed yellow noodles under lower legs and upper back.  Removed leg noodle once stable and performed supported floating hip abd/add x 2 sets of 10 reps and bicycles x 2 sets of 10 reps.  Note that R knee seems to be lacking more flexion than L during this task but when performing modified squat (in supine with feet against wall) we are able to bring him into quite a bit of flexion bilaterally.  Performed 5 push offs into knee/hip ext before reporting increased pain in knees therefore discontinued task.    Floated pt over to pool bench and had him grasp bench with B hands facing bench, removed noodle and assisting with turning to sit on bench.  Placed small step under feet for support.  Both PTs assisting on either side to get feet in flat position on step and had pt work to lean forward for LE weight bearing but not to stand.  Feel severe crepitus in B knees when performing task.  Pt reporting he has to lose about 100lbs in order to be eligible for knee replacement surgery.   Removed step and had him scoot forward so that feet were on floor (assist from PTs as needed) and had him place an arm around each PT for sit<>stand.  Cues and facilitation for forward trunk lean to improve ease and control of sit<>stand.  Performed 3 reps again with severe crepitus noted.    Pt then ambulated another 4 laps as before and then ambulated as able towards lift chair (floated when unable to ambulate).  Once out of pool, pt assisted back to chair via slideboard as stated above.    PTs to look at schedules and work to get pt in the pool again with +2 for safety before seeing as single therapist.  Pt and wife in agreement and wife stating she is willing to get into pool.     Pt requires buoyancy of water for support for reduced fall risk and for unloading/reduced stress on joints (B knees) as pt able to tolerate increased standing and ambulation in water compared to that on land; viscosity of water is needed for resistance for strengthening and current of water provides perturbations for challenge for balance training      PATIENT EDUCATION:  Education details: aquatic rationale Person educated: Patient Education method: Programmer, multimedia, Demonstration, and Handouts Education comprehension: verbalized understanding, returned demonstration, and needs further education  HOME EXERCISE PROGRAM: Access Code: [email or web address] URL: [email or web address] Date: 04/21/2023 Prepared by: Vernon Prey April Kirstie Peri  Exercises - Staggered Bridge  - 1 x daily - 7 x weekly - 2 sets - 10 reps - Hooklying Isometric Clamshell  - 1 x daily - 7 x weekly - 2 sets - 10 reps - Small Range Straight Leg Raise  - 1 x daily - 7 x weekly - 2 sets - 10 reps  ASSESSMENT:  CLINICAL IMPRESSION: Pt presents for first pool session today at [facility] with +2A for safety.  Performed slideboard transfers when getting out due to fatigue and safety but overall patient did very well and actually had decreased pain  in pool.  Discussed that we would like to see him at least one more visit as +2 for safety to ensure we will be able to see him as single therapist.  Will contact him once we have it worked out.  Pt and wife verbalized understanding.    OBJECTIVE IMPAIRMENTS: Abnormal gait, decreased balance, decreased endurance, decreased mobility, difficulty walking, decreased  ROM, decreased strength, increased edema, improper body mechanics, postural dysfunction, obesity, and pain.   GOALS: Goals reviewed with patient? Yes  SHORT TERM GOALS: Target date: 04/15/24   Pt will be ind with initial HEP Baseline:  Goal status: 05/06/23- provided, but inconsistent in performing. Ongoing  2.  Pt will be able to tolerate standing x 1 min in the parallel bars with BUE support and CGA Baseline: 06/01/23- Goal updated after recent hospitalization Goal status: 06/15/23- Stands x up to 30 sec with BUE support in parallel bars, Improved ability to slide hands with slightly less reliance on UE support.ongoing  3.  Pt will be able to perform sliding board transfer with S, including set up, and up a slight (3") grade Baseline: 06/01/23- Goal updated after recent hospitalization Goal status: 06/15/23- Min A for set up, S for level transfer, required mult scoots, improved excursion with shifts.   LONG TERM GOALS: Target date: 07/14/2023   Pt will be ind with management and progression of HEP Baseline:  Goal status: INITIAL  2.  Pt will be able to amb at least 20' with RW min A for home mobility Baseline:  Goal status: 06/15/23- Stands x up to 30 sec with BUE support in parallel bars, Improved ability to slide hands with slightly less reliance on UE support.ongoing  3.  Pt will be able to perform at least 2 reps of sit to stand in 30 sec with UE support Baseline: Unable to perform complete STS without mod A Goal status: 06/15/23-Unable to stand without UE support, ongoing  4.  Pt will have increased FOTO to  >/=54 Baseline:  Goal status: 06/15/23-26.8, ongoing  PLAN:  PT FREQUENCY: 2x/week  PT DURATION: 12 weeks  PLANNED INTERVENTIONS: 97164- PT Re-evaluation, 97110-Therapeutic exercises, 97530- Therapeutic activity, 97112- Neuromuscular re-education, 97535- Self Care, 16109- Manual therapy, [id number]- Gait training, [id number]- Orthotic Fit/training, [phone]- Aquatic Therapy, 97014- Electrical stimulation (unattended), 97035- Ultrasound, 91478- Ionotophoresis 4mg /ml Dexamethasone, Patient/Family education, Balance training, Stair training, Taping, Dry Needling, Joint mobilization, Spinal mobilization, Cryotherapy, and Moist heat  PLAN FOR NEXT SESSION: Assess response to HEP. Continue to work on LE strengthening and transfers/standing.   Harriet Butte, PT, MPT [facility] [address] [place], [place], [address] Phone: [phone]   Fax:  [phone] 06/30/23, 10:05 AM

## 2023-06-30 NOTE — Progress Notes (Signed)
WEIGHT SUMMARY AND BIOMETRICS  Weight Lost Since Last Visit: 0  Weight Gained Since Last Visit: 22lb   Vitals Temp: 97.7 F (36.5 C) BP: (!) 167/66 Pulse Rate: 68 SpO2: 97 %   Anthropometric Measurements Height: 5\' 11"  (1.803 m) Weight: (!) 342 lb (155.1 kg) BMI (Calculated): 47.72 Weight at Last Visit: 322lb Weight Lost Since Last Visit: 0 Weight Gained Since Last Visit: 22lb Starting Weight: 326lb   No data recorded Other Clinical Data Fasting: no Labs: no Today's Visit #: 4 Starting Date: 05/13/23    OBESITY Raymond [name] is here to discuss his progress with his obesity treatment plan along with follow-up of his obesity related diagnoses.    Nutrition Plan: the Category 4 plan - 50% adherence.  Current exercise:  water aerobics   Interim History:  He comes in today for his weight check and his healthy weight visit and his weight is up 22 pounds.  He has been slightly short of breath and states that he is not peeing as much as he had been and that he has fluid in his legs.  Eating all of the food on the plan., Protein intake is as prescribed, and Water intake is adequate.   Pharmacotherapy: Raymond [name] is on Mounjaro 10 mg SQ weekly Adverse side effects: Nausea occasional  Hunger is moderately controlled.  Cravings are moderately controlled.  Assessment/Plan:   Hypertension Hypertension poorly controlled.  He states that he has been slightly short of breath, but not severe and that he has more swelling in his lower extremities.  His wife states that he has been urinating less.  As stated above he is up approximately 22 pounds since his last visit.  It is difficult to weigh him as he cannot get up from a wheelchair and we have to calculate the weight of his wheelchair.  Medication(s): Coreg 25 mg twice daily  and Hydrochlorothiazide 12.5 mg daily( started at  his last visit). And Cardura 2 mg.   BP Readings from Last 3 Encounters:  06/30/23 (!) 167/66  06/16/23 (!) 179/100  05/26/23 (!) 157/87   Lab Results  Component Value Date   CREATININE 1.87 (H) 05/25/2023   CREATININE 1.85 (H) 05/24/2023   CREATININE 1.59 (H) 05/23/2023   Lab Results  Component Value Date   GFR 92.19 10/05/2014    Plan: Continue all antihypertensives at current dosages. No added salt. Will keep sodium content to 1,500 mg or less per day.   He will keep his fluid level to 64 ounces or below until he sees his PCP. We called his PCPs office (Dr. Cyndy Freeze, in [place]) and spoke with his nurse. He has an appointment tomorrow at 2:45 PM.  If his condition worsens during this time then he will go to the emergency room he and his wife understand.  He will take one of his Bumex that he  has at home and 1 extra potassium but no additional medications except his regular meds.  I stressed the importance of him keeping this appointment.  He [name] need to see cardiology and have an updated workup and perhaps an echocardiogram to check his cardiac output.  Restrictive Lung Disease:   He has a history of restrictive lung disease and he quit smoking in October 2024.  He still has some short of breath periodically and also has been coughing up some sputum intermittently.  Plan: Will not smoke. He Goodness need to have another follow-up with his pulmonologist.   Morbid Obesity: Current BMI BMI (Calculated): 47.72   Pharmacotherapy Plan Continue  Mounjaro 10 mg SQ weekly  Raymond [name] is currently in the action stage of change. As such, his goal is to continue with weight loss efforts.  He has agreed to the Category 2 plan.  Exercise goals: No exercise has been prescribed at this time.  Behavioral modification strategies: increasing lean protein intake, decreasing simple carbohydrates , no meal skipping, decrease eating out, meal planning , better snacking choices, planning for  success, get rid of junk food in the home, avoiding temptations, keep healthy foods in the home, measure portion sizes, and mindful eating.  Raymond [name] has agreed to follow-up with our clinic in 4 weeks.       Objective:   VITALS: Per patient if applicable, see vitals. GENERAL: Alert and in no acute distress. CARDIOPULMONARY: No increased WOB. Speaking in clear sentences.  Lung: No adventitious sounds noted.  Extremities: 2+ fluid bilaterally.  PSYCH: Pleasant and cooperative. Speech normal rate and rhythm. Affect is appropriate. Insight and judgement are appropriate. Attention is focused, linear, and appropriate.  NEURO: Oriented as arrived to appointment on time with no prompting.   Attestation Statements:   Time spent on visit including the items listed below was 34 minutes.  -preparing to see the patient (e.g., review of tests, history, previous notes) -obtaining and/or reviewing separately obtained history -counseling and educating the patient/family/caregiver -documenting clinical information in the electronic or other health record -ordering medications, tests, or procedures -independently interpreting results and communicating results to the patient/ family/caregiver -referring and communicating with other health care professionals  -care coordination   This was prepared with the assistance of Engineer, civil (consulting).  Occasional wrong-word or sound-a-like substitutions [place] have occurred due to the inherent limitations of voice recognition   Corinna Capra, DO

## 2023-07-01 ENCOUNTER — Ambulatory Visit: Payer: PRIVATE HEALTH INSURANCE | Admitting: Physical Therapy

## 2023-07-06 ENCOUNTER — Encounter: Payer: Self-pay | Admitting: Physical Therapy

## 2023-07-06 ENCOUNTER — Ambulatory Visit: Payer: No Typology Code available for payment source | Attending: Physician Assistant | Admitting: Physical Therapy

## 2023-07-06 ENCOUNTER — Ambulatory Visit: Payer: No Typology Code available for payment source | Admitting: Occupational Therapy

## 2023-07-06 DIAGNOSIS — R29898 Other symptoms and signs involving the musculoskeletal system: Secondary | ICD-10-CM | POA: Diagnosis present

## 2023-07-06 DIAGNOSIS — M79621 Pain in right upper arm: Secondary | ICD-10-CM

## 2023-07-06 DIAGNOSIS — M25612 Stiffness of left shoulder, not elsewhere classified: Secondary | ICD-10-CM | POA: Insufficient documentation

## 2023-07-06 DIAGNOSIS — R2689 Other abnormalities of gait and mobility: Secondary | ICD-10-CM | POA: Insufficient documentation

## 2023-07-06 DIAGNOSIS — R2681 Unsteadiness on feet: Secondary | ICD-10-CM | POA: Diagnosis present

## 2023-07-06 DIAGNOSIS — M79622 Pain in left upper arm: Secondary | ICD-10-CM | POA: Insufficient documentation

## 2023-07-06 DIAGNOSIS — M25611 Stiffness of right shoulder, not elsewhere classified: Secondary | ICD-10-CM

## 2023-07-06 DIAGNOSIS — M6281 Muscle weakness (generalized): Secondary | ICD-10-CM

## 2023-07-06 NOTE — Therapy (Unsigned)
OUTPATIENT OCCUPATIONAL THERAPY NEURO Treatment  Patient Name: Raymond [name] MRN: [id number] DOB:[birth year], 58 y.o., male Today's Date: 07/07/2023  PCP: Dr. Tawanna Solo PROVIDER: Dr. Carlis Abbott  END OF SESSION:  OT End of Session - 07/07/23 1636     Visit Number 13    Number of Visits 32    Date for OT Re-Evaluation 08/24/23    Authorization Type cigna    Authorization Time Period 12 weeks    OT Start Time 1404    OT Stop Time 1445    OT Time Calculation (min) 41 min    Activity Tolerance Patient tolerated treatment well    Behavior During Therapy WFL for tasks assessed/performed                     Past Medical History:  Diagnosis Date   Acute kidney injury superimposed on chronic kidney disease (HCC) 04/13/2023   Acute on chronic anemia 04/13/2023   Anemia    Anxiety    Back pain    COPD (chronic obstructive pulmonary disease) (HCC)    Depression    Diabetes mellitus without complication (HCC)    Diverticulosis    with hx of LGIB   Edema    GERD (gastroesophageal reflux disease)    GIB (gastrointestinal bleeding)    Gout    Heavy smoker    Hyperlipidemia    Hypertension    Iron deficiency anemia    Sleep apnea    uses a cpap   Swelling of lower extremity    Past Surgical History:  Procedure Laterality Date   CHONDROPLASTY Left 06/28/2014   Procedure: CHONDROPLASTY;  Surgeon: Thera Flake., MD;  Location: [facility];  Service: Orthopedics;  Laterality: Left;   COLONOSCOPY     FOOT FASCIOTOMY     age 66-rt   KNEE ARTHROSCOPY WITH LATERAL MENISECTOMY Left 06/28/2014   Procedure: KNEE ARTHROSCOPY WITH LATERAL MENISECTOMY;  Surgeon: Thera Flake., MD;  Location: [facility];  Service: Orthopedics;  Laterality: Left;   KNEE ARTHROSCOPY WITH MEDIAL MENISECTOMY Left 06/28/2014   Procedure: LEFT KNEE ARTHROSCOPY CHONDROPLASTY/WITH MEDIAL/LATERAL MENISECTOMIES;  Surgeon: Thera Flake., MD;  Location: [facility];  Service: Orthopedics;  Laterality: Left;   ORIF RADIUS & ULNA FRACTURES  2007   left   THORACIC DISCECTOMY N/A 03/12/2023   Procedure: THORACIC LAMINECTOMY AND  DISCECTOMY;  Surgeon: Coletta Memos, MD;  Location: [facility] OR;  Service: Neurosurgery;  Laterality: N/A;   Patient Active Problem List   Diagnosis Date Noted   Cystitis 05/21/2023   Acute diverticulitis 05/21/2023   Vitamin D deficiency 05/14/2023   Low HDL (under 40) 05/14/2023   GERD (gastroesophageal reflux disease) 04/13/2023   Constipation 04/13/2023   Chronic back pain 04/13/2023   DVT, bilateral lower limbs (HCC) 04/13/2023   Coping style affecting medical condition 03/27/2023   Thoracic myelopathy with LE weakness 03/23/2023   HNP (herniated nucleus pulposus with myelopathy), thoracic 03/12/2023   Hyperlipidemia 12/24/2022   Failure to thrive in adult 12/24/2022   Tobacco abuse 03/18/2018   BPH associated with nocturia 03/11/2017   History of substance abuse (HCC) 03/11/2017   Restless leg syndrome 03/11/2017   Restrictive lung disease 05/14/2016   OSA on CPAP 05/14/2016   Moderate COPD (chronic obstructive pulmonary disease) (HCC) 05/14/2016   Smoking greater than 40 pack years 05/14/2016   SOB (shortness of breath) 10/06/2014   Edema of extremities 10/06/2014   Benign  essential HTN 10/06/2014   Type 2 diabetes mellitus, without long-term current use of insulin (HCC) 07/18/2011    ONSET DATE: 04/08/23- referral date  REFERRING DIAG:  Diagnosis  M47.14 (ICD-10-CM) - Other spondylosis with myelopathy, thoracic region    THERAPY DIAG:  Muscle weakness (generalized) - Plan: Ot plan of care cert/re-cert,   Stiffness of right knee, not elsewhere classified - Plan: Ot plan of care cert/re-cert,   Other symptoms and signs involving the musculoskeletal system - Plan: Ot plan of care cert/re-cert,   Unsteadiness on feet - Plan: Ot plan of care cert/re-cert,   Other abnormalities of gait and mobility -  Plan: Ot plan of care cert/re-cert,   Pain in left upper arm - Plan: Ot plan of care cert/re-cert  Pain in right upper arm - Plan: Ot plan of care cert/re-cert  Stiffness of left shoulder, not elsewhere classified - Plan: Ot plan of care cert/re-cert  Stiffness of right shoulder, not elsewhere classified - Plan: Ot plan of care cert/re-cert  Rationale for Evaluation and Treatment: Rehabilitation  SUBJECTIVE:   SUBJECTIVE STATEMENT: Pt reports shoulder pain is better  Pt accompanied by: self  PERTINENT HISTORY: 58 y.o. male presenting with increased low back pain after a fall. Pt was found to have thoracic myelopathy due to a disc herniation at T10/11. He is now s/p T10 laminectomy and Discectomy. Pt received therapies at [facility] and he d/c home 03/23/23 PMH of anxiety, COPD, DM, Edema, Gout, HTN, HLD, L medial menisectomy, ORIF L radius and ulna.  PRECAUTIONS: Back  WEIGHT BEARING RESTRICTIONS: No  PAIN:  Are you having pain? Yes: NPRS scale: 3/10 Pain location: shoulder,  Pain description: aching Aggravating factors: sitting still Relieving factors: meds  FALLS: Has patient fallen in last 6 months? Yes. Number of falls 1  LIVING ENVIRONMENT: Lives with: lives with their spouse Lives in: House/apartment Stairs:  has ramp Has following equipment at home: Walker - 2 wheeled and bed side commode  PLOF: Independent with basic ADLs  PATIENT GOALS: increase I with ADLS/ADLS  OBJECTIVE:  Note: Objective measures were completed at Evaluation unless otherwise noted.  HAND DOMINANCE: Right  ADLs: Overall ADLs: increased time required, uses w/c, pt is not able to ambulate or stand for ADLs at this time Transfers/ambulation related to ADLs: Eating: mod I Grooming: mod I UB Dressing: setup LB Dressing: mod A,  Toileting: uses bedside commode supervision-min A for scooting Bathing: sponge bathing, min A per pt report Tub Shower transfers: n/a  Equipment: bed side  commode Unable to get into bathroom with w/c  IADLs: dependent with IADLS   MOBILITY STATUS:  supervision to min A for transfers  -scooting from w/c, pt is unable to stand for ADLs  Activitiy tolerance: 45 mins to hour with rest break    UPPER EXTREMITY ROM:    Active ROM Right eval Left eval  Shoulder flexion 90 110  Shoulder abduction 90 100  Shoulder adduction    Shoulder extension    Shoulder internal rotation    Shoulder external rotation    Elbow flexion [facility] WFL  Elbow extension [facility] WFL  Wrist flexion    Wrist extension    Wrist ulnar deviation    Wrist radial deviation    Wrist pronation    Wrist supination  90%  (Blank rows = not tested)  UPPER EXTREMITY MMT:     MMT Right eval Left eval  Shoulder flexion 3+/5 4/5  Shoulder abduction    Shoulder adduction  Shoulder extension    Shoulder internal rotation    Shoulder external rotation    Middle trapezius    Lower trapezius    Elbow flexion 4/5 4+/5  Elbow extension 4//5 4+/5  Wrist flexion    Wrist extension    Wrist ulnar deviation    Wrist radial deviation    Wrist pronation    Wrist supination    (Blank rows = not tested)  HAND FUNCTION: Grip strength: Right: 72 lbs; Left: 75 lbs  COORDINATION: 9 Hole Peg test: Right: 23.21 sec; Left: 32.52 sec  SENSATION: WFL      COGNITION: Overall cognitive status: Within functional limits for tasks assessed    OBSERVATIONS: Pleasant male is highly motivated to improve   TODAY'S TREATMENT: 07/06/23- Pt simulating donning / doffing pants x 3 reps seated edge of mat, min v.c/ facilitation initally then pt returned demonstration following practice  with pt leaning side to side to pull pants up over hips and use of reaching for donning pants over legs. Pt performed donning/ doffing shoes mod I Chest press, shoulder flexion low range 2 sets of 10 reps within pain free ROM Shoulder extension 2 sets of 10 reps, min v.c    06/24/23 Ultrasound 1  mhz, 1.0 w/cm 2, 20%  x 7 mins to right shoulder , no adverse reactions.  Ultrasound  to left shoulder/ , 1.0 w/cm 2, 20% x 7 mins for pain relief no adverse reactions.     Closed chain chest press and internal/ external rotation, pt performed without reports of pain Pt with 1 remaining scab on right shoulder, this area was avoided with application of ionto. Pt with several small areas of mild pink coloration from previous ionto, ionto patches were moved to avoid these areas.  Iontophoresis Patch Set-up and Applied.  No adverse reactions observed while in clinic.  Type: Desamethasone Location: bilateral shoulders Dose: 45mA/min, 1mL patch Time: 4hr patch- pt was instructed when to remove Pt was monitored in clinic x 5 mins, no adverse reactions Therapist discussed with pt the improtance of activity modification and minimizing stress on the shoulders as much as possible.  06/22/23-Ultrasound 1 mhz, 1.0 w/cm 2, 20%  x 7 mins to right shoulder and biceps, no adverse reactions.  Ultrasound  to left shoulder/ biceps , 1.0 w/cm 2, 20% x 7 mins for pain relief no adverse reactions.    Pt reports decreased pain following [place]. Closed chain chest press and internal external rotation, pt perfromed without reports of pain Pt with 1 remaining scab on right shoulder, this area was avoided with application of ionto. Iontophoresis Patch Set-up and Applied.  No adverse reactions observed while in clinic.  Type: Desamethasone Location: bilateral shoulders Dose: 76mA/min, 1mL patch Time: 4hr patch Pt was monitored in clinic x 5 mins, no adverse reactions   06/17/23 Pt attempted closed chain shoulder flexion with cane however this increased pain so discontinued. Internal external rotation with cane, 20 reps mod v.c no increased pain Tableslides for shoulder flexion bilateral UE's no pain 10-20 reps as well as shoulder horizontal abduction, min v.c. Pt reports his shoulders feel better today s/p ionto  yesterday. Pt with one scab from small blister on right anterior shoulder, therapist avoided this area when applying ionto today. Iontophoresis Patch Set-up and Applied.  No adverse reactions observed while in clinic.  Type: Desamethasone Location: bilateral shoulders Dose: 92mA/min, 1mL patch Time: 4hr patch Pt was moniored in the clinic x 10 mins s/p application while therpist  discussed ADL strategies with pt. He was instructed to remove the patch in 4 hrs at 6:30. Pt practiced donning/ doffing shoe with reacher and shoe horn. Pt completed task with max difficulty, min v.c.                                                                                                         06/16/23  Ultrasound 1 mhz, 8.0 w/cm 2, 20%  x 8 mins to right shoulder and biceps, no adverse reactions.  Ultrasound  to left shoulder/ biceps , 1.0 w/cm 2, 20% x 8 mins for pain relief no adverse reactions.      Iontophoresis Patch Set-up and Applied.  No adverse reactions observed while in clinic.  Type: Desamethasone Location: bilateral shoulders Dose: 78mA/min, 1mL patch Time: 4hr patch Pt was monitored in the clinic x 5 mins s/p application. He was instructed to remove the patch in 4 hrs at 1:30.                                                                                                          06/04/23:    Ultrasound 1 mhz, 1.0 w/cm 2, 20%  x 8 mins to right shoulder and biceps, no adverse reactions.  Ultrasound  to left shoulder, , 1.0 w/cm 2, 20% x 8 mins for pain relief no adverse reactions.   Closed-chain shoulder flex with BUEs in pain-free range with min cueing for positioning/sholder hike  Iontophoresis Patch Set-up and Applied.  No adverse reactions observed while in clinic.  Pt/wife instructed in Ionto instructions/precautions and contraindications Type: Desamethasone Location: bilateral shoulders Dose: 19mA/min, 1mL patch Time: 4hr patch    06/01/23 Therapist checked progress towards  goals as pt was recently hospitalize for diverticulitis.  Pt demonstrates bilateral shoulder flexion to 70* prior to significant pain. He reports increased bilateral shoulder pain and he has been cautioned to limit repetative use of bilateral UE's and to avoid biceps curls. [place] 1 mhz, 1.0 w/cm 2, 20%  x 8 mins to right shoulder and biceps, no adverse reactions. Ice pack applied to R shoulder x 7 mins while pt recived UE to left shoulder. No adverse reactions. Pt reports he is nearly pain free afterwards. [place] to left shoulder, , 1.0 w/cm 2, 20% x 7 mins for pain relief no adverse reactions. Therapist discussed use of Iontophoreis with pt and he is agreeable. Therapist to request order.  05/20/23- US:1- mhz,  1.0 w/cm 2, 50%, x 10 mins  for L biceps tendon and biceps and left lateral/ anterior shoulder and left shoulder pain, no adverse reactions  Ice pack applied to  left shoulder x 8 min  Pt reports pain was improved to 2-3 /10 at end of session  05/14/23-UBE x 6 mins level 3 for conditioning Pt reports difficulty with bathing his bottom and hygine. Therapist discussed with pt/ wife and provided information regarding toilet aide, handheld bidet and long handled sponge. therapist recommends pt does as much as he can then has assistnace from his wife for thoroughness. Therapist also discussed with pt/ wife activies pt can perform at home at w/c level: folding clothes, wiping kitchen counter, and retieveing a snack for the fridge. Closed chain shoulder flexion with cane, to 90*, 20 reps min v.c Biceps curls and triceps extension 15 reps each, min v.c  05/11/23-UBE x 6 mins level 2 for conditioning Biceps curls 2 sets of 10-15 reps with 2 lbs hand weights, triceps ext with red band, min v.c 15 reps each UE Closed chain shoulder flexion with cane 15 reps to shoulder height, min v.c to avoid shoulder hike, then 15 reps with 2 lbs cane  Pt practiced scooting along edge of mat with emphasis on lifting up  with arms and legs before scoot, min v.c Pt simulated donning pants with reacher and min v.c and demonstration. Pt transferred back to w/c via sliding board with supervision min-mod v.c  05/07/23 UBE x 6 mins level 3 for conditioning Pt was instructed in updates to HEP, for bilateral UE's min v.c and demonstration 10 reps each, red band and cane were used see pt instuctions.  05/06/23- UBE x 6 mins level 2 for conditioning, seated in w/c Pt transferred scoot pivot without sliding board to mat with supervision Seated on mat closed chain shoulder flexion and biceps curls 10 reps each, min v.c  Biceps curls x 20 reps with 3lbs weight bilateral UE's, min v.c shoulder flexion bilateal UE's with 2 lbs weight in each hand, 15 reps. Pt practiced scooting on mat with cueing to shift weight fowrard and pushdown through hands to scoot, pt also practiced using this technique for a sliding board transfer back to w/c. min v.c Pt's wife rports pt an't go into the kitchen because he can't remove his leg rests. Pt practiced removing and reattaching w/c leg rests with min v.c the pt practiced walking his w/c forwards with legs while assisting aby pushing wheels. see education  04/22/23 eval  PATIENT EDUCATION: Education details: see above Person educated: Patient  Education method: Explanation, Verbal cues,  Education comprehension: verbalized understanding,  HOME EXERCISE PROGRAM:  red band and vertical cane 05/07/23   GOALS: Goals reviewed with patient? Yes  SHORT TERM GOALS: Target date: 07/01/22  I with inital HEP.  Goal status: ongoing, 06/01/23  2.  Pt will peform LB dressing with min A  Goal status: pt reports donning pants with min A   3.  Pt will consistently perform toilet transfers with supervision.  Goal status: ongoing, supervision with transfer, min A hygeine  4.  Pt will perform snack or beverage prep at a w/c level  Goal status: ongoing, dependent 06/01/23  5. Pt will  verbalize understanding of back precautions.  Goal status: ongoing, 06/01/23, reveiwed BAT, however pt needs reinforcement    LONG TERM GOALS: Target date: 08/24/22  I with updated HEP. Baseline:  Goal status:  ongoing  2.  Pt will perform bathing with supervision/ set up  Goal status:  ongoing  3.  Pt will perform LB dressing with supervision/ set up.  Goal status: I ongoing  4.  Pt will demonstrate  ability with stand for 5 mins with min A for ADLs/ IADLs.  Goal status:  ongoing  5.  Pt will perfrom simple home management at a walker level with min A.  Goal status:  ongoing   ASSESSMENT:    CLINICAL IMPRESSION: Pt is progressing towards goals. He demonstrates decreased shoulder pain and improving ADLperformance.  PERFORMANCE DEFICITS: in functional skills including ADLs, IADLs, ROM, strength, pain, flexibility, mobility, balance, body mechanics, endurance, decreased knowledge of precautions, decreased knowledge of use of DME, and UE functional use,  and psychosocial skills including coping strategies, environmental adaptation, habits, interpersonal interactions, and routines and behaviors.   IMPAIRMENTS: are limiting patient from ADLs, IADLs, rest and sleep, work, play, leisure, and social participation.   CO-MORBIDITIES: Fallert have co-morbidities  that affects occupational performance. Patient will benefit from skilled OT to address above impairments and improve overall function.  MODIFICATION OR ASSISTANCE TO COMPLETE EVALUATION: No modification of tasks or assist necessary to complete an evaluation.  OT OCCUPATIONAL PROFILE AND HISTORY: Detailed assessment: Review of records and additional review of physical, cognitive, psychosocial history related to current functional performance.  CLINICAL DECISION MAKING: LOW - limited treatment options, no task modification necessary  REHAB POTENTIAL: Good  EVALUATION COMPLEXITY: Low    PLAN:  OT FREQUENCY: 2x/week  OT  DURATION: 12 weeks plus eval  PLANNED INTERVENTIONS: 97168 OT Re-evaluation, 97535 self care/ADL training, 16109 therapeutic exercise, 97530 therapeutic activity, 97112 neuromuscular re-education, 97140 manual therapy, 97116 gait training, 60454 aquatic therapy, 97035 ultrasound, 97018 paraffin, 09811 moist heat, 97010 cryotherapy, passive range of motion, balance training, stair training, functional mobility training, psychosocial skills training, energy conservation, coping strategies training, patient/family education, and DME and/or AE instructions  RECOMMENDED OTHER SERVICES: PT  CONSULTED AND AGREED WITH PLAN OF CARE: Patient  PLAN FOR NEXT SESSION:   ADL strategies, add visits vs plan to take a break   Kiarrah Rausch, OTR/L 07/07/2023, 4:37 PM

## 2023-07-06 NOTE — Therapy (Signed)
OUTPATIENT PHYSICAL THERAPY CERVICAL/THORACIC TREATMENT     Patient Name: Raymond [name] MRN: [id number] DOB:[birth year], 58 y.o., male Today's Date: 07/06/2023  END OF SESSION:  PT End of Session - 07/06/23 1259     Visit Number 15    Number of Visits 25    Date for PT Re-Evaluation 07/27/23    Authorization Type [employer] Managed    PT Start Time 1301    PT Stop Time 1342    PT Time Calculation (min) 41 min    Activity Tolerance Patient tolerated treatment well    Behavior During Therapy [facility] for tasks assessed/performed             Past Medical History:  Diagnosis Date   Acute kidney injury superimposed on chronic kidney disease (HCC) 04/13/2023   Acute on chronic anemia 04/13/2023   Anemia    Anxiety    Back pain    COPD (chronic obstructive pulmonary disease) (HCC)    Depression    Diabetes mellitus without complication (HCC)    Diverticulosis    with hx of LGIB   Edema    GERD (gastroesophageal reflux disease)    GIB (gastrointestinal bleeding)    Gout    Heavy smoker    Hyperlipidemia    Hypertension    Iron deficiency anemia    Sleep apnea    uses a cpap   Swelling of lower extremity    Past Surgical History:  Procedure Laterality Date   CHONDROPLASTY Left 06/28/2014   Procedure: CHONDROPLASTY;  Surgeon: Thera Flake., MD;  Location: [facility];  Service: Orthopedics;  Laterality: Left;   COLONOSCOPY     FOOT FASCIOTOMY     age 40-rt   KNEE ARTHROSCOPY WITH LATERAL MENISECTOMY Left 06/28/2014   Procedure: KNEE ARTHROSCOPY WITH LATERAL MENISECTOMY;  Surgeon: Thera Flake., MD;  Location: [facility];  Service: Orthopedics;  Laterality: Left;   KNEE ARTHROSCOPY WITH MEDIAL MENISECTOMY Left 06/28/2014   Procedure: LEFT KNEE ARTHROSCOPY CHONDROPLASTY/WITH MEDIAL/LATERAL MENISECTOMIES;  Surgeon: Thera Flake., MD;  Location: [facility];  Service: Orthopedics;  Laterality: Left;   ORIF RADIUS & ULNA FRACTURES  2007    left   THORACIC DISCECTOMY N/A 03/12/2023   Procedure: THORACIC LAMINECTOMY AND  DISCECTOMY;  Surgeon: Coletta Memos, MD;  Location: [facility];  Service: Neurosurgery;  Laterality: N/A;   Patient Active Problem List   Diagnosis Date Noted   Cystitis 05/21/2023   Acute diverticulitis 05/21/2023   Vitamin D deficiency 05/14/2023   Low HDL (under 40) 05/14/2023   GERD (gastroesophageal reflux disease) 04/13/2023   Constipation 04/13/2023   Chronic back pain 04/13/2023   DVT, bilateral lower limbs (HCC) 04/13/2023   Coping style affecting medical condition 03/27/2023   Thoracic myelopathy with LE weakness 03/23/2023   HNP (herniated nucleus pulposus with myelopathy), thoracic 03/12/2023   Hyperlipidemia 12/24/2022   Failure to thrive in adult 12/24/2022   Tobacco abuse 03/18/2018   BPH associated with nocturia 03/11/2017   History of substance abuse (HCC) 03/11/2017   Restless leg syndrome 03/11/2017   Restrictive lung disease 05/14/2016   OSA on CPAP 05/14/2016   Moderate COPD (chronic obstructive pulmonary disease) (HCC) 05/14/2016   Smoking greater than 40 pack years 05/14/2016   SOB (shortness of breath) 10/06/2014   Edema of extremities 10/06/2014   Benign essential HTN 10/06/2014   Type 2 diabetes mellitus, without long-term current use of insulin (HCC) 07/18/2011  PCP: Venetia Constable, MD  REFERRING PROVIDER:  Charlton Amor, PA-C   REFERRING DIAG: [phone] (ICD-10-CM) - Other spondylosis with myelopathy, thoracic region   THERAPY DIAG:  Muscle weakness (generalized)  Unsteadiness on feet  Other symptoms and signs involving the musculoskeletal system  Rationale for Evaluation and Treatment: Rehabilitation  ONSET DATE: October 2024  SUBJECTIVE:                                                                                                                                                                                                         SUBJECTIVE  STATEMENT:  Doing OK, really enjoyed the pool and would to do it again.    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 He was found to have large HNP T10/T11 and underwent T10 laminectomy with discectomy by Dr. Franky Macho on 03/12/2023   PAIN:  Are you having pain? No 0/10  PRECAUTIONS: None  RED FLAGS: None     WEIGHT BEARING RESTRICTIONS: No  FALLS:  Has patient fallen in last 6 months? Yes. Number of falls 1 -- last fall Oct 2024  LIVING ENVIRONMENT: Lives with: lives with their spouse Lives in: House/apartment Stairs:  ramp Has following equipment at home: Environmental consultant - 2 wheeled, Wheelchair (manual), and upright walker, hospital bed  OCCUPATION: Retired  PLOF:  Able to do his own ADLs but easier to have wife assist him  PATIENT GOALS: Walk with the walker at least household distances  NEXT MD VISIT: 04/21/23 [place]  OBJECTIVE:  Note: Objective measures were completed at Evaluation unless otherwise noted.  DIAGNOSTIC FINDINGS:  03/12/23 MRI THORACIC SPINE IMPRESSION:   1. Moderate-sized lobulated left subarticular to foraminal disc protrusion at T10-11 with resultant severe spinal stenosis. Cord is compressed and deviated to the right at this level. Associated cord signal changes concerning for edema and/or myelomalacia. 2. No other acute abnormality within the thoracic spine. 3. Degenerative spondylosis at T8-9 through T11-12 without significant spinal stenosis. Associated moderate to severe bilateral foraminal narrowing at these levels as above.  MRI LUMBAR SPINE IMPRESSION:   1. No acute abnormality within the lumbar spine. 2. Multifactorial degenerative changes at L4-5 with resultant mild canal with severe left and moderate right lateral  recess stenosis, with severe bilateral L4 foraminal narrowing. 3. Additional mild noncompressive disc bulging and facet hypertrophy elsewhere within the lumbar  spine as above. No other significant stenosis or frank neural impingement.   Critical Value/emergent results were called by telephone at the time of interpretation on 03/12/2023 at 12:41 am to provider Dr. Madilyn Hook, who verbally acknowledged these results.  PATIENT SURVEYS:  FOTO 42; predicted 28  COGNITION: Overall cognitive status: Within functional limits for tasks assessed  SENSATION: WFL  POSTURE: rounded shoulders and forward head  PALPATION: No overt tenderness to palpation   CERVICAL ROM: WNL  LOWER EXTREMITY MMT:    MMT Right eval Left eval  Hip flexion 3+ 3  Hip extension 3+ 3+  Hip abduction 3+ 3+  Hip adduction    Hip internal rotation    Hip external rotation    Knee flexion 4- 3+  Knee extension 4- 3+  Ankle dorsiflexion    Ankle plantarflexion    Ankle inversion    Ankle eversion     (Blank rows = not tested)   FUNCTIONAL TESTS:  Transfers:  Chair to bed: Multiple seated scoots with UEs  Sit<>stand: max A -- limited due to knee pain Supine to sit<>sit: min A for trunk and LE negotiation. Not performing log rolling  TODAY'S TREATMENT:                                                                                                                              DATE:   07/06/23  Seated LAQs 2.5# 1x10 B, 4# 1x10 B  Seated marches 2.5# x10 B Lateral trunk crunches x10 B Mini TA crunches at edge of table x10  Seated small range SLRs 2.5# x10 B Bridges 2x10 Supine clams green TB x15  Low load long duration HS stretches 1x60 seconds  Cues for mobility- head forward with sliding board transfers and good technique for bed mobility    06/30/23  Patient seen for aquatic therapy today.  Treatment took place in water 3.6-4.0 feet deep depending upon activity.  Pt entered and exited the pool via  chair lift; pt was transferred from his wheelchair to pool chair lift with mod A (+2 for safety) assist using slideboard when getting out of pool but without board getting onto lift chair.  Pool temp approx 92 deg.   Once in water, had pt grab onto wall for support while PT elevated lift chair back out of the water.  Secondary PT assisted floating to deeper end of pool as pt is tall and to make standing easier for pt.  Once in 4' water, assisted with getting feet under him and had him work to stand fully which he is able to do.  Provided pt with water walker and with +2A for safety (one PT ant/one PT post to pt) had him ambulate across pool x 6 laps with cues for ensuring trunk remains over feet and to "push down" through legs throughout, esp with turns as he initially would float during turn.  Pt with LE scissoring which seems to be spasticity related  but this improved throughout session as well as knee pain decreased with increased walking.    Then placed him in float on secondary therapist while primary PT retrieved and placed yellow noodles under lower legs and upper back.  Removed leg noodle once stable and performed supported floating hip abd/add x 2 sets of 10 reps and bicycles x 2 sets of 10 reps.  Note that R knee seems to be lacking more flexion than L during this task but when performing modified squat (in supine with feet against wall) we are able to bring him into quite a bit of flexion bilaterally.  Performed 5 push offs into knee/hip ext before reporting increased pain in knees therefore discontinued task.    Floated pt over to pool bench and had him grasp bench with B hands facing bench, removed noodle and assisting with turning to sit on bench.  Placed small step under feet for support.  Both PTs assisting on either side to get feet in flat position on step and had pt work to lean forward for LE weight bearing but not to stand.  Feel severe crepitus in B knees when performing task.  Pt  reporting he has to lose about 100lbs in order to be eligible for knee replacement surgery.  Removed step and had him scoot forward so that feet were on floor (assist from PTs as needed) and had him place an arm around each PT for sit<>stand.  Cues and facilitation for forward trunk lean to improve ease and control of sit<>stand.  Performed 3 reps again with severe crepitus noted.    Pt then ambulated another 4 laps as before and then ambulated as able towards lift chair (floated when unable to ambulate).  Once out of pool, pt assisted back to chair via slideboard as stated above.    PTs to look at schedules and work to get pt in the pool again with +2 for safety before seeing as single therapist.  Pt and wife in agreement and wife stating she is willing to get into pool.     Pt requires buoyancy of water for support for reduced fall risk and for unloading/reduced stress on joints (B knees) as pt able to tolerate increased standing and ambulation in water compared to that on land; viscosity of water is needed for resistance for strengthening and current of water provides perturbations for challenge for balance training      PATIENT EDUCATION:  Education details: aquatic rationale Person educated: Patient Education method: Programmer, multimedia, Demonstration, and Handouts Education comprehension: verbalized understanding, returned demonstration, and needs further education  HOME EXERCISE PROGRAM: Access Code: [email or web address] URL: [email or web address] Date: 04/21/2023 Prepared by: Vernon Prey April Kirstie Peri  Exercises - Staggered Bridge  - 1 x daily - 7 x weekly - 2 sets - 10 reps - Hooklying Isometric Clamshell  - 1 x daily - 7 x weekly - 2 sets - 10 reps - Small Range Straight Leg Raise  - 1 x daily - 7 x weekly - 2 sets - 10 reps  ASSESSMENT:  CLINICAL IMPRESSION:  Pt arrives doing well today- really enjoyed the water, we were able to get him worked in for another aquatic PT session.  Would likely be to his benefit to do one visit/week on land and one in water. Worked on general LE and core strengthening today. Doing well with transfers overall but did need intermittent cues for head forward when scooting.     OBJECTIVE IMPAIRMENTS: Abnormal gait, decreased balance, decreased endurance,  decreased mobility, difficulty walking, decreased ROM, decreased strength, increased edema, improper body mechanics, postural dysfunction, obesity, and pain.   GOALS: Goals reviewed with patient? Yes  SHORT TERM GOALS: Target date: 04/15/24   Pt will be ind with initial HEP Baseline:  Goal status: 05/06/23- provided, but inconsistent in performing. Ongoing  2.  Pt will be able to tolerate standing x 1 min in the parallel bars with BUE support and CGA Baseline: 06/01/23- Goal updated after recent hospitalization Goal status: 06/15/23- Stands x up to 30 sec with BUE support in parallel bars, Improved ability to slide hands with slightly less reliance on UE support.ongoing  3.  Pt will be able to perform sliding board transfer with S, including set up, and up a slight (3") grade Baseline: 06/01/23- Goal updated after recent hospitalization Goal status: 06/15/23- Min A for set up, S for level transfer, required mult scoots, improved excursion with shifts.   LONG TERM GOALS: Target date: 07/14/2023   Pt will be ind with management and progression of HEP Baseline:  Goal status: INITIAL  2.  Pt will be able to amb at least 20' with RW min A for home mobility Baseline:  Goal status: 06/15/23- Stands x up to 30 sec with BUE support in parallel bars, Improved ability to slide hands with slightly less reliance on UE support.ongoing  3.  Pt will be able to perform at least 2 reps of sit to stand in 30 sec with UE support Baseline: Unable to perform complete STS without mod A Goal status: 06/15/23-Unable to stand without UE support, ongoing  4.  Pt will have increased FOTO to >/=54 Baseline:   Goal status: 06/15/23-26.8, ongoing  PLAN:  PT FREQUENCY: 2x/week  PT DURATION: 12 weeks  PLANNED INTERVENTIONS: 97164- PT Re-evaluation, 97110-Therapeutic exercises, 97530- Therapeutic activity, 97112- Neuromuscular re-education, 97535- Self Care, 29562- Manual therapy, [id number]- Gait training, [phone]- Orthotic Fit/training, [phone]- Aquatic Therapy, 97014- Electrical stimulation (unattended), [id number]- Ultrasound, 96295- Ionotophoresis 4mg /ml Dexamethasone, Patient/Family education, Balance training, Stair training, Taping, Dry Needling, Joint mobilization, Spinal mobilization, Cryotherapy, and Moist heat  PLAN FOR NEXT SESSION: Assess response to HEP. Continue to work on LE strengthening and transfers/standing. Keep working on Doctor, hospital with transfers    Nedra Hai, PT, DPT 07/06/23 1:46 PM

## 2023-07-07 ENCOUNTER — Telehealth: Payer: Self-pay

## 2023-07-07 ENCOUNTER — Telehealth: Payer: Self-pay | Admitting: Physical Therapy

## 2023-07-07 NOTE — Telephone Encounter (Signed)
Patient came into the office today for a weight check. Patients weight today 361 lb. Previous weight 342 (06/30/23) Patient is scheduled to see PCP for follow up tomorrow regarding weight increase 06/30/23 and 07/07/23.

## 2023-07-07 NOTE — Telephone Encounter (Signed)
Patient has no complaints of chest pain, SOB. Just noted swelling per patients wife. He does not want to go to the hospital and will follow up with PCP tomorrow.

## 2023-07-07 NOTE — Telephone Encounter (Signed)
Called and left patient a message that aquatic therapy appt scheduled on 07-08-23 has been cancelled due to pt's weight gain of 40# since 06-16-23; strongly urged pt to go to ED, as per pt message in chart s/p weight check today per [facility], CMA.

## 2023-07-07 NOTE — Telephone Encounter (Signed)
Attempted to call patient and wife and no answer. Mychart message sent to patient.

## 2023-07-08 ENCOUNTER — Ambulatory Visit: Payer: Self-pay | Admitting: Physical Therapy

## 2023-07-08 ENCOUNTER — Ambulatory Visit: Payer: No Typology Code available for payment source | Admitting: Occupational Therapy

## 2023-07-08 ENCOUNTER — Ambulatory Visit: Payer: No Typology Code available for payment source | Admitting: Physical Therapy

## 2023-07-13 ENCOUNTER — Ambulatory Visit: Payer: No Typology Code available for payment source | Admitting: Occupational Therapy

## 2023-07-13 ENCOUNTER — Ambulatory Visit: Payer: No Typology Code available for payment source

## 2023-07-13 ENCOUNTER — Encounter: Payer: Self-pay | Admitting: Occupational Therapy

## 2023-07-13 DIAGNOSIS — R29898 Other symptoms and signs involving the musculoskeletal system: Secondary | ICD-10-CM

## 2023-07-13 DIAGNOSIS — M6281 Muscle weakness (generalized): Secondary | ICD-10-CM

## 2023-07-13 DIAGNOSIS — R2681 Unsteadiness on feet: Secondary | ICD-10-CM

## 2023-07-13 DIAGNOSIS — M79621 Pain in right upper arm: Secondary | ICD-10-CM

## 2023-07-13 DIAGNOSIS — M79622 Pain in left upper arm: Secondary | ICD-10-CM

## 2023-07-13 DIAGNOSIS — M25611 Stiffness of right shoulder, not elsewhere classified: Secondary | ICD-10-CM

## 2023-07-13 DIAGNOSIS — M25612 Stiffness of left shoulder, not elsewhere classified: Secondary | ICD-10-CM

## 2023-07-13 NOTE — Therapy (Signed)
 OUTPATIENT OCCUPATIONAL THERAPY NEURO Treatment  Patient Name: Raymond [name] MRN: [id number] DOB:[birth year], 58 y.o., male Today's Date: 07/13/2023  PCP: Dr. Victorio Grave PROVIDER: Dr. Alessandra Ancona  END OF SESSION:            Past Medical History:  Diagnosis Date   Acute kidney injury superimposed on chronic kidney disease (HCC) 04/13/2023   Acute on chronic anemia 04/13/2023   Anemia    Anxiety    Back pain    COPD (chronic obstructive pulmonary disease) (HCC)    Depression    Diabetes mellitus without complication (HCC)    Diverticulosis    with hx of LGIB   Edema    GERD (gastroesophageal reflux disease)    GIB (gastrointestinal bleeding)    Gout    Heavy smoker    Hyperlipidemia    Hypertension    Iron deficiency anemia    Sleep apnea    uses a cpap   Swelling of lower extremity    Past Surgical History:  Procedure Laterality Date   CHONDROPLASTY Left 06/28/2014   Procedure: CHONDROPLASTY;  Surgeon: Forbes Ida., MD;  Location: [facility];  Service: Orthopedics;  Laterality: Left;   COLONOSCOPY     FOOT FASCIOTOMY     age 12-rt   KNEE ARTHROSCOPY WITH LATERAL MENISECTOMY Left 06/28/2014   Procedure: KNEE ARTHROSCOPY WITH LATERAL MENISECTOMY;  Surgeon: Forbes Ida., MD;  Location: [facility];  Service: Orthopedics;  Laterality: Left;   KNEE ARTHROSCOPY WITH MEDIAL MENISECTOMY Left 06/28/2014   Procedure: LEFT KNEE ARTHROSCOPY CHONDROPLASTY/WITH MEDIAL/LATERAL MENISECTOMIES;  Surgeon: Forbes Ida., MD;  Location: [facility];  Service: Orthopedics;  Laterality: Left;   ORIF RADIUS & ULNA FRACTURES  2007   left   THORACIC DISCECTOMY N/A 03/12/2023   Procedure: THORACIC LAMINECTOMY AND  DISCECTOMY;  Surgeon: Audie Bleacher, MD;  Location: [facility];  Service: Neurosurgery;  Laterality: N/A;   Patient Active Problem List   Diagnosis Date Noted   Cystitis 05/21/2023   Acute diverticulitis 05/21/2023   Vitamin D   deficiency 05/14/2023   Low HDL (under 40) 05/14/2023   GERD (gastroesophageal reflux disease) 04/13/2023   Constipation 04/13/2023   Chronic back pain 04/13/2023   DVT, bilateral lower limbs (HCC) 04/13/2023   Coping style affecting medical condition 03/27/2023   Thoracic myelopathy with LE weakness 03/23/2023   HNP (herniated nucleus pulposus with myelopathy), thoracic 03/12/2023   Hyperlipidemia 12/24/2022   Failure to thrive  in adult 12/24/2022   Tobacco abuse 03/18/2018   BPH associated with nocturia 03/11/2017   History of substance abuse (HCC) 03/11/2017   Restless leg syndrome 03/11/2017   Restrictive lung disease 05/14/2016   OSA on CPAP 05/14/2016   Moderate COPD (chronic obstructive pulmonary disease) (HCC) 05/14/2016   Smoking greater than 40 pack years 05/14/2016   SOB (shortness of breath) 10/06/2014   Edema of extremities 10/06/2014   Benign essential HTN 10/06/2014   Type 2 diabetes mellitus, without long-term current use of insulin  (HCC) 07/18/2011    ONSET DATE: 04/08/23- referral date  REFERRING DIAG:  Diagnosis  M47.14 (ICD-10-CM) - Other spondylosis with myelopathy, thoracic region    THERAPY DIAG:  Muscle weakness (generalized) - Plan: Ot plan of care cert/re-cert,   Stiffness of right knee, not elsewhere classified - Plan: Ot plan of care cert/re-cert,   Other symptoms and signs involving the musculoskeletal system - Plan: Ot plan of care cert/re-cert,   Unsteadiness on feet -  Plan: Ot plan of care cert/re-cert,   Other abnormalities of gait and mobility - Plan: Ot plan of care cert/re-cert,   Pain in left upper arm - Plan: Ot plan of care cert/re-cert  Pain in right upper arm - Plan: Ot plan of care cert/re-cert  Stiffness of left shoulder, not elsewhere classified - Plan: Ot plan of care cert/re-cert  Stiffness of right shoulder, not elsewhere classified - Plan: Ot plan of care cert/re-cert  Rationale for Evaluation and Treatment:  Rehabilitation  SUBJECTIVE:   SUBJECTIVE STATEMENT: Pt reports  he is dizzy today  Pt accompanied by: self  PERTINENT HISTORY: 58 y.o. male presenting with increased low back pain after a fall. Pt was found to have thoracic myelopathy due to a disc herniation at T10/11. He is now s/p T10 laminectomy and Discectomy. Pt received therapies at [facility] and he d/c home 03/23/23 PMH of anxiety, COPD, DM, Edema, Gout, HTN, HLD, L medial menisectomy, ORIF L radius and ulna.  PRECAUTIONS: Back  WEIGHT BEARING RESTRICTIONS: No  PAIN:  Are you having pain? Yes: NPRS scale: 3/10 Pain location: shoulder,  Pain description: aching Aggravating factors: sitting still Relieving factors: meds  FALLS: Has patient fallen in last 6 months? Yes. Number of falls 1  LIVING ENVIRONMENT: Lives with: lives with their spouse Lives in: House/apartment Stairs:  has ramp Has following equipment at home: Walker - 2 wheeled and bed side commode  PLOF: Independent with basic ADLs  PATIENT GOALS: increase I with ADLS/ADLS  OBJECTIVE:  Note: Objective measures were completed at Evaluation unless otherwise noted.  HAND DOMINANCE: Right  ADLs: Overall ADLs: increased time required, uses w/c, pt is not able to ambulate or stand for ADLs at this time Transfers/ambulation related to ADLs: Eating: mod I Grooming: mod I UB Dressing: setup LB Dressing: mod A,  Toileting: uses bedside commode supervision-min A for scooting Bathing: sponge bathing, min A per pt report Tub Shower transfers: n/a  Equipment: bed side commode Unable to get into bathroom with w/c  IADLs: dependent with IADLS   MOBILITY STATUS:  supervision to min A for transfers  -scooting from w/c, pt is unable to stand for ADLs  Activitiy tolerance: 45 mins to hour with rest break    UPPER EXTREMITY ROM:    Active ROM Right eval Left eval  Shoulder flexion 90 110  Shoulder abduction 90 100  Shoulder adduction    Shoulder extension     Shoulder internal rotation    Shoulder external rotation    Elbow flexion [facility] WFL  Elbow extension [facility] WFL  Wrist flexion    Wrist extension    Wrist ulnar deviation    Wrist radial deviation    Wrist pronation    Wrist supination  90%  (Blank rows = not tested)  UPPER EXTREMITY MMT:     MMT Right eval Left eval  Shoulder flexion 3+/5 4/5  Shoulder abduction    Shoulder adduction    Shoulder extension    Shoulder internal rotation    Shoulder external rotation    Middle trapezius    Lower trapezius    Elbow flexion 4/5 4+/5  Elbow extension 4//5 4+/5  Wrist flexion    Wrist extension    Wrist ulnar deviation    Wrist radial deviation    Wrist pronation    Wrist supination    (Blank rows = not tested)  HAND FUNCTION: Grip strength: Right: 72 lbs; Left: 75 lbs  COORDINATION: 9 Hole Peg test: Right:  23.21 sec; Left: 32.52 sec  SENSATION: WFL      COGNITION: Overall cognitive status: Within functional limits for tasks assessed    OBSERVATIONS: Pleasant male is highly motivated to improve   TODAY'S TREATMENT:07/13/23- Pt transferred to mat via sliding board with supervision and min v.c. for boar placement Pt reported he was dizzy. Therapist checked BP. Initial reading was 231 over 121. Pt took a brief rest and drank some water BP was 188/11/0. Therapist reviewed CVA warning signs with pt. and discussed importance of monitoring BP at home. Pt was instructed to call his MD if BP does not improve and diastolic remains greater than 295. Therapist called pt's wife. Pt had a broken break the supervisor/ PT fixed for pt before he left. PT left with his wife who was aware of pt's elevated BP. Therapist discussed plans with pt/ wife to place OT on hold after next visist.    07/06/23- Pt simulating donning / doffing pants x 3 reps seated edge of mat, min v.c/ facilitation initally then pt returned demonstration following practice  with pt leaning side to side to pull  pants up over hips and use of reaching for donning pants over legs. Pt performed donning/ doffing shoes mod I Chest press, shoulder flexion low range 2 sets of 10 reps within pain free ROM Shoulder extension 2 sets of 10 reps, min v.c    06/24/23 Ultrasound 1 mhz, 1.0 w/cm 2, 20%  x 7 mins to right shoulder , no adverse reactions.  Ultrasound  to left shoulder/ , 1.0 w/cm 2, 20% x 7 mins for pain relief no adverse reactions.     Closed chain chest press and internal/ external rotation, pt performed without reports of pain Pt with 1 remaining scab on right shoulder, this area was avoided with application of ionto. Pt with several small areas of mild pink coloration from previous ionto, ionto patches were moved to avoid these areas.  Iontophoresis Patch Set-up and Applied.  No adverse reactions observed while in clinic.  Type: Desamethasone Location: bilateral shoulders Dose: 75mA/min, 1mL patch Time: 4hr patch- pt was instructed when to remove Pt was monitored in clinic x 5 mins, no adverse reactions Therapist discussed with pt the improtance of activity modification and minimizing stress on the shoulders as much as possible.  06/22/23-Ultrasound 1 mhz, 1.0 w/cm 2, 20%  x 7 mins to right shoulder and biceps, no adverse reactions.  Ultrasound  to left shoulder/ biceps , 1.0 w/cm 2, 20% x 7 mins for pain relief no adverse reactions.    Pt reports decreased pain following US . Closed chain chest press and internal external rotation, pt perfromed without reports of pain Pt with 1 remaining scab on right shoulder, this area was avoided with application of ionto. Iontophoresis Patch Set-up and Applied.  No adverse reactions observed while in clinic.  Type: Desamethasone Location: bilateral shoulders Dose: 41mA/min, 1mL patch Time: 4hr patch Pt was monitored in clinic x 5 mins, no adverse reactions   06/17/23 Pt attempted closed chain shoulder flexion with cane however this increased pain  so discontinued. Internal external rotation with cane, 20 reps mod v.c no increased pain Tableslides for shoulder flexion bilateral UE's no pain 10-20 reps as well as shoulder horizontal abduction, min v.c. Pt reports his shoulders feel better today s/p ionto yesterday. Pt with one scab from small blister on right anterior shoulder, therapist avoided this area when applying ionto today. Iontophoresis Patch Set-up and Applied.  No adverse reactions observed  while in clinic.  Type: Desamethasone Location: bilateral shoulders Dose: 39mA/min, 1mL patch Time: 4hr patch Pt was moniored in the clinic x 10 mins s/p application while therpist discussed ADL strategies with pt. He was instructed to remove the patch in 4 hrs at 6:30. Pt practiced donning/ doffing shoe with reacher and shoe horn. Pt completed task with max difficulty, min v.c.                                                                                                         06/16/23  Ultrasound 1 mhz, 8.0 w/cm 2, 20%  x 8 mins to right shoulder and biceps, no adverse reactions.  Ultrasound  to left shoulder/ biceps , 1.0 w/cm 2, 20% x 8 mins for pain relief no adverse reactions.      Iontophoresis Patch Set-up and Applied.  No adverse reactions observed while in clinic.  Type: Desamethasone Location: bilateral shoulders Dose: 71mA/min, 1mL patch Time: 4hr patch Pt was monitored in the clinic x 5 mins s/p application. He was instructed to remove the patch in 4 hrs at 1:30.                                                                                                          06/04/23:    Ultrasound 1 mhz, 1.0 w/cm 2, 20%  x 8 mins to right shoulder and biceps, no adverse reactions.  Ultrasound  to left shoulder, , 1.0 w/cm 2, 20% x 8 mins for pain relief no adverse reactions.   Closed-chain shoulder flex with BUEs in pain-free range with min cueing for positioning/sholder hike  Iontophoresis Patch Set-up and Applied.  No  adverse reactions observed while in clinic.  Pt/wife instructed in Ionto instructions/precautions and contraindications Type: Desamethasone Location: bilateral shoulders Dose: 39mA/min, 1mL patch Time: 4hr patch    06/01/23 Therapist checked progress towards goals as pt was recently hospitalize for diverticulitis.  Pt demonstrates bilateral shoulder flexion to 70* prior to significant pain. He reports increased bilateral shoulder pain and he has been cautioned to limit repetative use of bilateral UE's and to avoid biceps curls. US  1 mhz, 1.0 w/cm 2, 20%  x 8 mins to right shoulder and biceps, no adverse reactions. Ice pack applied to R shoulder x 7 mins while pt recived UE to left shoulder. No adverse reactions. Pt reports he is nearly pain free afterwards. US  to left shoulder, , 1.0 w/cm 2, 20% x 7 mins for pain relief no adverse reactions. Therapist discussed use of Iontophoreis with pt and he is agreeable. Therapist to request order.  05/20/23- US :1- mhz,  1.0 w/cm  2, 50%, x 10 mins  for L biceps tendon and biceps and left lateral/ anterior shoulder and left shoulder pain, no adverse reactions  Ice pack applied to left shoulder x 8 min  Pt reports pain was improved to 2-3 /10 at end of session  05/14/23-UBE x 6 mins level 3 for conditioning Pt reports difficulty with bathing his bottom and hygine. Therapist discussed with pt/ wife and provided information regarding toilet aide, handheld bidet and long handled sponge. therapist recommends pt does as much as he can then has assistnace from his wife for thoroughness. Therapist also discussed with pt/ wife activies pt can perform at home at w/c level: folding clothes, wiping kitchen counter, and retieveing a snack for the fridge. Closed chain shoulder flexion with cane, to 90*, 20 reps min v.c Biceps curls and triceps extension 15 reps each, min v.c  05/11/23-UBE x 6 mins level 2 for conditioning Biceps curls 2 sets of 10-15 reps with 2 lbs  hand weights, triceps ext with red band, min v.c 15 reps each UE Closed chain shoulder flexion with cane 15 reps to shoulder height, min v.c to avoid shoulder hike, then 15 reps with 2 lbs cane  Pt practiced scooting along edge of mat with emphasis on lifting up with arms and legs before scoot, min v.c Pt simulated donning pants with reacher and min v.c and demonstration. Pt transferred back to w/c via sliding board with supervision min-mod v.c  05/07/23 UBE x 6 mins level 3 for conditioning Pt was instructed in updates to HEP, for bilateral UE's min v.c and demonstration 10 reps each, red band and cane were used see pt instuctions.  05/06/23- UBE x 6 mins level 2 for conditioning, seated in w/c Pt transferred scoot pivot without sliding board to mat with supervision Seated on mat closed chain shoulder flexion and biceps curls 10 reps each, min v.c  Biceps curls x 20 reps with 3lbs weight bilateral UE's, min v.c shoulder flexion bilateal UE's with 2 lbs weight in each hand, 15 reps. Pt practiced scooting on mat with cueing to shift weight fowrard and pushdown through hands to scoot, pt also practiced using this technique for a sliding board transfer back to w/c. min v.c Pt's wife rports pt an't go into the kitchen because he can't remove his leg rests. Pt practiced removing and reattaching w/c leg rests with min v.c the pt practiced walking his w/c forwards with legs while assisting aby pushing wheels. see education  04/22/23 eval  PATIENT EDUCATION: Education details: see above Person educated: Patient  Education method: Explanation, Verbal cues,  Education comprehension: verbalized understanding,  HOME EXERCISE PROGRAM:  red band and vertical cane 05/07/23   GOALS: Goals reviewed with patient? Yes  SHORT TERM GOALS: Target date: 07/01/22  I with inital HEP.  Goal status: ongoing, 06/01/23  2.  Pt will peform LB dressing with min A  Goal status: pt reports donning pants with min  A   3.  Pt will consistently perform toilet transfers with supervision.  Goal status: ongoing, supervision with transfer, min A hygeine  4.  Pt will perform snack or beverage prep at a w/c level  Goal status: ongoing, dependent 06/01/23  5. Pt will verbalize understanding of back precautions.  Goal status: ongoing, 06/01/23, reveiwed BAT, however pt needs reinforcement    LONG TERM GOALS: Target date: 08/24/22  I with updated HEP. Baseline:  Goal status:  ongoing  2.  Pt will perform bathing with supervision/ set  up  Goal status:  ongoing  3.  Pt will perform LB dressing with supervision/ set up.  Goal status: I ongoing  4.  Pt will demonstrate ability with stand for 5 mins with min A for ADLs/ IADLs.  Goal status:  ongoing  5.  Pt will perfrom simple home management at a walker level with min A.  Goal status:  ongoing   ASSESSMENT:    CLINICAL IMPRESSION: Pt treatment today was limited today by elevated BP.  PERFORMANCE DEFICITS: in functional skills including ADLs, IADLs, ROM, strength, pain, flexibility, mobility, balance, body mechanics, endurance, decreased knowledge of precautions, decreased knowledge of use of DME, and UE functional use,  and psychosocial skills including coping strategies, environmental adaptation, habits, interpersonal interactions, and routines and behaviors.   IMPAIRMENTS: are limiting patient from ADLs, IADLs, rest and sleep, work, play, leisure, and social participation.   CO-MORBIDITIES: Nardozzi have co-morbidities  that affects occupational performance. Patient will benefit from skilled OT to address above impairments and improve overall function.  MODIFICATION OR ASSISTANCE TO COMPLETE EVALUATION: No modification of tasks or assist necessary to complete an evaluation.  OT OCCUPATIONAL PROFILE AND HISTORY: Detailed assessment: Review of records and additional review of physical, cognitive, psychosocial history related to current functional  performance.  CLINICAL DECISION MAKING: LOW - limited treatment options, no task modification necessary  REHAB POTENTIAL: Good  EVALUATION COMPLEXITY: Low    PLAN:  OT FREQUENCY: 2x/week  OT DURATION: 12 weeks plus eval  PLANNED INTERVENTIONS: 97168 OT Re-evaluation, 97535 self care/ADL training, 16109 therapeutic exercise, 97530 therapeutic activity, 97112 neuromuscular re-education, 97140 manual therapy, 97116 gait training, 60454 aquatic therapy, 97035 ultrasound, 97018 paraffin, 09811 moist heat, 97010 cryotherapy, passive range of motion, balance training, stair training, functional mobility training, psychosocial skills training, energy conservation, coping strategies training, patient/family education, and DME and/or AE instructions  RECOMMENDED OTHER SERVICES: PT  CONSULTED AND AGREED WITH PLAN OF CARE: Patient  PLAN FOR NEXT SESSION:   check goals, take a break from OT after next visit until mobility improves   Chardonnay Holzmann, OTR/L 07/13/2023, 1:08 PM

## 2023-07-13 NOTE — Therapy (Deleted)
 OUTPATIENT PHYSICAL THERAPY CERVICAL/THORACIC TREATMENT     Patient Name: Raymond [name] MRN: [id number] DOB:1965-08-30, 58 y.o., male Today's Date: 07/13/2023  END OF SESSION:    Past Medical History:  Diagnosis Date   Acute kidney injury superimposed on chronic kidney disease (HCC) 04/13/2023   Acute on chronic anemia 04/13/2023   Anemia    Anxiety    Back pain    COPD (chronic obstructive pulmonary disease) (HCC)    Depression    Diabetes mellitus without complication (HCC)    Diverticulosis    with hx of LGIB   Edema    GERD (gastroesophageal reflux disease)    GIB (gastrointestinal bleeding)    Gout    Heavy smoker    Hyperlipidemia    Hypertension    Iron deficiency anemia    Sleep apnea    uses a cpap   Swelling of lower extremity    Past Surgical History:  Procedure Laterality Date   CHONDROPLASTY Left 06/28/2014   Procedure: CHONDROPLASTY;  Surgeon: Forbes Ida., MD;  Location: [facility];  Service: Orthopedics;  Laterality: Left;   COLONOSCOPY     FOOT FASCIOTOMY     age 49-rt   KNEE ARTHROSCOPY WITH LATERAL MENISECTOMY Left 06/28/2014   Procedure: KNEE ARTHROSCOPY WITH LATERAL MENISECTOMY;  Surgeon: Forbes Ida., MD;  Location: [facility];  Service: Orthopedics;  Laterality: Left;   KNEE ARTHROSCOPY WITH MEDIAL MENISECTOMY Left 06/28/2014   Procedure: LEFT KNEE ARTHROSCOPY CHONDROPLASTY/WITH MEDIAL/LATERAL MENISECTOMIES;  Surgeon: Forbes Ida., MD;  Location: [facility];  Service: Orthopedics;  Laterality: Left;   ORIF RADIUS & ULNA FRACTURES  2007   left   THORACIC DISCECTOMY N/A 03/12/2023   Procedure: THORACIC LAMINECTOMY AND  DISCECTOMY;  Surgeon: Audie Bleacher, MD;  Location: [facility] OR;  Service: Neurosurgery;  Laterality: N/A;   Patient Active Problem List   Diagnosis Date Noted   Cystitis 05/21/2023   Acute diverticulitis 05/21/2023   Vitamin D  deficiency 05/14/2023   Low HDL (under 40) 05/14/2023    GERD (gastroesophageal reflux disease) 04/13/2023   Constipation 04/13/2023   Chronic back pain 04/13/2023   DVT, bilateral lower limbs (HCC) 04/13/2023   Coping style affecting medical condition 03/27/2023   Thoracic myelopathy with LE weakness 03/23/2023   HNP (herniated nucleus pulposus with myelopathy), thoracic 03/12/2023   Hyperlipidemia 12/24/2022   Failure to thrive  in adult 12/24/2022   Tobacco abuse 03/18/2018   BPH associated with nocturia 03/11/2017   History of substance abuse (HCC) 03/11/2017   Restless leg syndrome 03/11/2017   Restrictive lung disease 05/14/2016   OSA on CPAP 05/14/2016   Moderate COPD (chronic obstructive pulmonary disease) (HCC) 05/14/2016   Smoking greater than 40 pack years 05/14/2016   SOB (shortness of breath) 10/06/2014   Edema of extremities 10/06/2014   Benign essential HTN 10/06/2014   Type 2 diabetes mellitus, without long-term current use of insulin  (HCC) 07/18/2011    PCP: Arlon Bergamo, MD  REFERRING PROVIDER:  Sterling Eisenmenger, PA-C   REFERRING DIAG: [phone] (ICD-10-CM) - Other spondylosis with myelopathy, thoracic region   THERAPY DIAG:  No diagnosis found.  Rationale for Evaluation and Treatment: Rehabilitation  ONSET DATE: October 2024  SUBJECTIVE:  SUBJECTIVE STATEMENT:  Doing OK, really enjoyed the pool and would to do it again.    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 He was found to have large HNP T10/T11 and underwent T10 laminectomy with discectomy by Dr. Michale Age on 03/12/2023    PAIN:  Are you having pain? No 0/10  PRECAUTIONS: None  RED FLAGS: None     WEIGHT BEARING RESTRICTIONS: No  FALLS:  Has patient fallen in last 6 months? Yes. Number of falls 1 -- last fall Oct 2024  LIVING ENVIRONMENT: Lives with: lives with their spouse Lives in: House/apartment Stairs:  ramp Has following equipment at home: Environmental consultant - 2 wheeled, Wheelchair (manual), and upright walker, hospital bed  OCCUPATION: Retired  PLOF:  Able to do his own ADLs but easier to have wife assist him  PATIENT GOALS: Walk with the walker at least household distances  NEXT MD VISIT: 04/21/23 [place]  OBJECTIVE:  Note: Objective measures were completed at Evaluation unless otherwise noted.  DIAGNOSTIC FINDINGS:  03/12/23 MRI THORACIC SPINE IMPRESSION:   1. Moderate-sized lobulated left subarticular to foraminal disc protrusion at T10-11 with resultant severe spinal stenosis. Cord is compressed and deviated to the right at this level. Associated cord signal changes concerning for edema and/or myelomalacia. 2. No other acute abnormality within the thoracic spine. 3. Degenerative spondylosis at T8-9 through T11-12 without significant spinal stenosis. Associated moderate to severe bilateral foraminal narrowing at these levels as above.  MRI LUMBAR SPINE IMPRESSION:   1. No acute abnormality within the lumbar spine. 2. Multifactorial degenerative changes at L4-5 with resultant mild canal with severe left and moderate right lateral recess stenosis, with severe bilateral L4 foraminal narrowing. 3. Additional mild noncompressive disc bulging and facet hypertrophy elsewhere within the lumbar spine as above. No other significant stenosis or frank neural impingement.   Critical Value/emergent results were called by telephone at the time of interpretation on 03/12/2023 at 12:41 am to provider Dr. Monique Ano, who verbally acknowledged these results.  PATIENT SURVEYS:  FOTO 42; predicted  51  COGNITION: Overall cognitive status: Within functional limits for tasks assessed  SENSATION: WFL  POSTURE: rounded shoulders and forward head  PALPATION: No overt tenderness to palpation   CERVICAL ROM: WNL  LOWER EXTREMITY MMT:    MMT Right eval Left eval  Hip flexion 3+ 3  Hip extension 3+ 3+  Hip abduction 3+ 3+  Hip adduction    Hip internal rotation    Hip external rotation    Knee flexion 4- 3+  Knee extension 4- 3+  Ankle dorsiflexion    Ankle plantarflexion    Ankle inversion    Ankle eversion     (Blank rows = not tested)   FUNCTIONAL TESTS:  Transfers:  Chair to bed: Multiple seated scoots with UEs  Sit<>stand: max A -- limited due to knee pain Supine to sit<>sit: min A for trunk and LE negotiation. Not performing log rolling  TODAY'S TREATMENT:  DATE:  07/13/23    07/06/23 Seated LAQs 2.5# 1x10 B, 4# 1x10 B  Seated marches 2.5# x10 B Lateral trunk crunches x10 B Mini TA crunches at edge of table x10  Seated small range SLRs 2.5# x10 B Bridges 2x10 Supine clams green TB x15  Low load long duration HS stretches 1x60 seconds  Cues for mobility- head forward with sliding board transfers and good technique for bed mobility    06/30/23  Patient seen for aquatic therapy today.  Treatment took place in water 3.6-4.0 feet deep depending upon activity.  Pt entered and exited the pool via chair lift; pt was transferred from his wheelchair to pool chair lift with mod A (+2 for safety) assist using slideboard when getting out of pool but without board getting onto lift chair.  Pool temp approx 92 deg.   Once in water, had pt grab onto wall for support while PT elevated lift chair back out of the water.  Secondary PT assisted floating to deeper end of pool as pt is tall and to make standing easier for pt.  Once in 4' water, assisted  with getting feet under him and had him work to stand fully which he is able to do.  Provided pt with water walker and with +2A for safety (one PT ant/one PT post to pt) had him ambulate across pool x 6 laps with cues for ensuring trunk remains over feet and to "push down" through legs throughout, esp with turns as he initially would float during turn.  Pt with LE scissoring which seems to be spasticity related but this improved throughout session as well as knee pain decreased with increased walking.    Then placed him in float on secondary therapist while primary PT retrieved and placed yellow noodles under lower legs and upper back.  Removed leg noodle once stable and performed supported floating hip abd/add x 2 sets of 10 reps and bicycles x 2 sets of 10 reps.  Note that R knee seems to be lacking more flexion than L during this task but when performing modified squat (in supine with feet against wall) we are able to bring him into quite a bit of flexion bilaterally.  Performed 5 push offs into knee/hip ext before reporting increased pain in knees therefore discontinued task.    Floated pt over to pool bench and had him grasp bench with B hands facing bench, removed noodle and assisting with turning to sit on bench.  Placed small step under feet for support.  Both PTs assisting on either side to get feet in flat position on step and had pt work to lean forward for LE weight bearing but not to stand.  Feel severe crepitus in B knees when performing task.  Pt reporting he has to lose about 100lbs in order to be eligible for knee replacement surgery.  Removed step and had him scoot forward so that feet were on floor (assist from PTs as needed) and had him place an arm around each PT for sit<>stand.  Cues and facilitation for forward trunk lean to improve ease and control of sit<>stand.  Performed 3 reps again with severe crepitus noted.    Pt then ambulated another 4 laps as before and then ambulated as able  towards lift chair (floated when unable to ambulate).  Once out of pool, pt assisted back to chair via slideboard as stated above.    PTs to look at schedules and work to get pt in the  pool again with +2 for safety before seeing as single therapist.  Pt and wife in agreement and wife stating she is willing to get into pool.     Pt requires buoyancy of water for support for reduced fall risk and for unloading/reduced stress on joints (B knees) as pt able to tolerate increased standing and ambulation in water compared to that on land; viscosity of water is needed for resistance for strengthening and current of water provides perturbations for challenge for balance training      PATIENT EDUCATION:  Education details: aquatic rationale Person educated: Patient Education method: Programmer, multimedia, Demonstration, and Handouts Education comprehension: verbalized understanding, returned demonstration, and needs further education  HOME EXERCISE PROGRAM: Access Code: [email or web address] URL: [email or web address] Date: 04/21/2023 Prepared by: Gellen April Marie Nonato  Exercises - Staggered Bridge  - 1 x daily - 7 x weekly - 2 sets - 10 reps - Hooklying Isometric Clamshell  - 1 x daily - 7 x weekly - 2 sets - 10 reps - Small Range Straight Leg Raise  - 1 x daily - 7 x weekly - 2 sets - 10 reps  ASSESSMENT:  CLINICAL IMPRESSION:  Pt arrives doing well today- really enjoyed the water, we were able to get him worked in for another aquatic PT session. Would likely be to his benefit to do one visit/week on land and one in water. Worked on general LE and core strengthening today. Doing well with transfers overall but did need intermittent cues for head forward when scooting.     OBJECTIVE IMPAIRMENTS: Abnormal gait, decreased balance, decreased endurance, decreased mobility, difficulty walking, decreased ROM, decreased strength, increased edema, improper body mechanics, postural dysfunction, obesity,  and pain.   GOALS: Goals reviewed with patient? Yes  SHORT TERM GOALS: Target date: 04/15/24   Pt will be ind with initial HEP Baseline:  Goal status: 05/06/23- provided, but inconsistent in performing. Ongoing  2.  Pt will be able to tolerate standing x 1 min in the parallel bars with BUE support and CGA Baseline: 06/01/23- Goal updated after recent hospitalization Goal status: 06/15/23- Stands x up to 30 sec with BUE support in parallel bars, Improved ability to slide hands with slightly less reliance on UE support.ongoing  3.  Pt will be able to perform sliding board transfer with S, including set up, and up a slight (3") grade Baseline: 06/01/23- Goal updated after recent hospitalization Goal status: 06/15/23- Min A for set up, S for level transfer, required mult scoots, improved excursion with shifts.   LONG TERM GOALS: Target date: 07/14/2023   Pt will be ind with management and progression of HEP Baseline:  Goal status: INITIAL  2.  Pt will be able to amb at least 20' with RW min A for home mobility Baseline:  Goal status: 06/15/23- Stands x up to 30 sec with BUE support in parallel bars, Improved ability to slide hands with slightly less reliance on UE support.ongoing  3.  Pt will be able to perform at least 2 reps of sit to stand in 30 sec with UE support Baseline: Unable to perform complete STS without mod A Goal status: 06/15/23-Unable to stand without UE support, ongoing  4.  Pt will have increased FOTO to >/=54 Baseline:  Goal status: 06/15/23-26.8, ongoing  PLAN:  PT FREQUENCY: 2x/week  PT DURATION: 12 weeks  PLANNED INTERVENTIONS: 97164- PT Re-evaluation, 97110-Therapeutic exercises, 97530- Therapeutic activity, 97112- Neuromuscular re-education, 97535- Self Care, 19147- Manual therapy, [id number]- Gait training, [phone]- Orthotic Fit/training, [phone]-  Aquatic Therapy, 97014- Electrical stimulation (unattended), [id number]- Ultrasound, 54098- Ionotophoresis 4mg /ml  Dexamethasone , Patient/Family education, Balance training, Stair training, Taping, Dry Needling, Joint mobilization, Spinal mobilization, Cryotherapy, and Moist heat  PLAN FOR NEXT SESSION: Assess response to HEP. Continue to work on LE strengthening and transfers/standing. Keep working on Doctor, hospital with transfers    Genieve Ramaswamy, NSCA-CPT, SPT 07/13/23 9:38 AM

## 2023-07-14 ENCOUNTER — Ambulatory Visit: Payer: No Typology Code available for payment source | Admitting: Rehabilitation

## 2023-07-15 ENCOUNTER — Encounter: Payer: Self-pay | Admitting: Physical Therapy

## 2023-07-15 ENCOUNTER — Encounter: Payer: Self-pay | Admitting: Occupational Therapy

## 2023-07-15 ENCOUNTER — Ambulatory Visit: Payer: No Typology Code available for payment source | Admitting: Physical Therapy

## 2023-07-15 ENCOUNTER — Ambulatory Visit: Payer: No Typology Code available for payment source | Admitting: Occupational Therapy

## 2023-07-15 DIAGNOSIS — R29898 Other symptoms and signs involving the musculoskeletal system: Secondary | ICD-10-CM

## 2023-07-15 DIAGNOSIS — M6281 Muscle weakness (generalized): Secondary | ICD-10-CM

## 2023-07-15 DIAGNOSIS — M79621 Pain in right upper arm: Secondary | ICD-10-CM

## 2023-07-15 DIAGNOSIS — M79622 Pain in left upper arm: Secondary | ICD-10-CM

## 2023-07-15 DIAGNOSIS — M25611 Stiffness of right shoulder, not elsewhere classified: Secondary | ICD-10-CM

## 2023-07-15 DIAGNOSIS — R2681 Unsteadiness on feet: Secondary | ICD-10-CM

## 2023-07-15 DIAGNOSIS — M25612 Stiffness of left shoulder, not elsewhere classified: Secondary | ICD-10-CM

## 2023-07-15 DIAGNOSIS — R2689 Other abnormalities of gait and mobility: Secondary | ICD-10-CM

## 2023-07-15 NOTE — Therapy (Signed)
OUTPATIENT PHYSICAL THERAPY CERVICAL/THORACIC TREATMENT/PROGRESS NOTE AND RECERT      Patient Name: Raymond [name] MRN: [id number] DOB:Aug 07, 1965, 58 y.o., male Today's Date: 07/15/2023  END OF SESSION:  PT End of Session - 07/15/23 1322     Visit Number 17    Number of Visits 25    Date for PT Re-Evaluation 09/09/23    Authorization Type [employer] Managed    PT Start Time 1147    PT Stop Time 1210   session limited by elevated BP   PT Time Calculation (min) 23 min    Activity Tolerance Treatment limited secondary to medical complications (Comment)    Behavior During Therapy [facility] for tasks assessed/performed;Lability              Past Medical History:  Diagnosis Date   Acute kidney injury superimposed on chronic kidney disease (HCC) 04/13/2023   Acute on chronic anemia 04/13/2023   Anemia    Anxiety    Back pain    COPD (chronic obstructive pulmonary disease) (HCC)    Depression    Diabetes mellitus without complication (HCC)    Diverticulosis    with hx of LGIB   Edema    GERD (gastroesophageal reflux disease)    GIB (gastrointestinal bleeding)    Gout    Heavy smoker    Hyperlipidemia    Hypertension    Iron deficiency anemia    Sleep apnea    uses a cpap   Swelling of lower extremity    Past Surgical History:  Procedure Laterality Date   CHONDROPLASTY Left 06/28/2014   Procedure: CHONDROPLASTY;  Surgeon: Thera Flake., MD;  Location: [facility];  Service: Orthopedics;  Laterality: Left;   COLONOSCOPY     FOOT FASCIOTOMY     age 84-rt   KNEE ARTHROSCOPY WITH LATERAL MENISECTOMY Left 06/28/2014   Procedure: KNEE ARTHROSCOPY WITH LATERAL MENISECTOMY;  Surgeon: Thera Flake., MD;  Location: [facility];  Service: Orthopedics;  Laterality: Left;   KNEE ARTHROSCOPY WITH MEDIAL MENISECTOMY Left 06/28/2014   Procedure: LEFT KNEE ARTHROSCOPY CHONDROPLASTY/WITH MEDIAL/LATERAL MENISECTOMIES;  Surgeon: Thera Flake., MD;  Location: [facility];  Service: Orthopedics;  Laterality: Left;   ORIF RADIUS & ULNA FRACTURES  2007   left   THORACIC DISCECTOMY N/A 03/12/2023   Procedure: THORACIC LAMINECTOMY AND  DISCECTOMY;  Surgeon: Coletta Memos, MD;  Location: [facility] OR;  Service: Neurosurgery;  Laterality: N/A;   Patient Active Problem List   Diagnosis Date Noted   Cystitis 05/21/2023   Acute diverticulitis 05/21/2023   Vitamin D deficiency 05/14/2023   Low HDL (under 40) 05/14/2023   GERD (gastroesophageal reflux disease) 04/13/2023   Constipation 04/13/2023   Chronic back pain 04/13/2023   DVT, bilateral lower limbs (HCC) 04/13/2023   Coping style affecting medical condition 03/27/2023   Thoracic myelopathy with LE weakness 03/23/2023   HNP (herniated nucleus pulposus with myelopathy), thoracic 03/12/2023   Hyperlipidemia 12/24/2022   Failure to thrive in adult 12/24/2022   Tobacco abuse 03/18/2018   BPH associated with nocturia 03/11/2017   History of substance abuse (HCC) 03/11/2017   Restless leg syndrome 03/11/2017   Restrictive lung disease 05/14/2016   OSA on CPAP 05/14/2016   Moderate COPD (chronic obstructive pulmonary disease) (HCC) 05/14/2016   Smoking greater than 40 pack years 05/14/2016   SOB (shortness of breath) 10/06/2014   Edema of extremities 10/06/2014   Benign essential HTN 10/06/2014   Type  2 diabetes mellitus, without long-term current use of insulin (HCC) 07/18/2011    PCP: Venetia Constable, MD  REFERRING PROVIDER:  Charlton Amor, PA-C   REFERRING DIAG: [phone] (ICD-10-CM) - Other spondylosis with myelopathy, thoracic region   THERAPY DIAG:  Muscle weakness (generalized)  Unsteadiness on feet  Other symptoms and signs involving the musculoskeletal system  Rationale for Evaluation and Treatment: Rehabilitation  ONSET DATE: October 2024  SUBJECTIVE:                                                                                                                                                                                                          SUBJECTIVE STATEMENT:  I've had 3 people call me this morning about that situation about my weight. Nothing really new. I've got more memory loss than I did last year, worked out the [employer].    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 He was found to have large HNP T10/T11 and underwent T10 laminectomy with discectomy by Dr. Franky Macho on 03/12/2023   PAIN:  Are you having pain? No 0/10  PRECAUTIONS: None  RED FLAGS: None     WEIGHT BEARING RESTRICTIONS: No  FALLS:  Has patient fallen in last 6 months? Yes. Number of falls 1 -- last fall Oct 2024  LIVING ENVIRONMENT: Lives with: lives with their spouse Lives in: House/apartment Stairs:  ramp Has following equipment at home: Environmental consultant - 2 wheeled, Wheelchair (manual), and upright walker, hospital bed  OCCUPATION: Retired  PLOF:  Able to do his own ADLs but easier to have wife assist him  PATIENT GOALS: Walk with the walker at least household distances  NEXT MD VISIT: 04/21/23 [place]  OBJECTIVE:  Note: Objective measures were completed at Evaluation unless otherwise noted.  DIAGNOSTIC FINDINGS:  03/12/23 MRI THORACIC SPINE IMPRESSION:   1. Moderate-sized lobulated left subarticular to foraminal disc protrusion at T10-11 with resultant severe spinal stenosis. Cord is compressed and deviated to the right at this level. Associated cord signal changes concerning for edema and/or myelomalacia. 2. No other acute abnormality within the thoracic spine. 3. Degenerative spondylosis at T8-9 through T11-12 without significant spinal stenosis. Associated moderate to severe bilateral foraminal narrowing at these levels  as above.  MRI LUMBAR SPINE IMPRESSION:   1. No acute abnormality within the lumbar spine. 2. Multifactorial degenerative changes  at L4-5 with resultant mild canal with severe left and moderate right lateral recess stenosis, with severe bilateral L4 foraminal narrowing. 3. Additional mild noncompressive disc bulging and facet hypertrophy elsewhere within the lumbar spine as above. No other significant stenosis or frank neural impingement.   Critical Value/emergent results were called by telephone at the time of interpretation on 03/12/2023 at 12:41 am to provider Dr. Madilyn Hook, who verbally acknowledged these results.  PATIENT SURVEYS:  FOTO 42; predicted 54; 06/15/23- 27; 07/15/23- 37   COGNITION: Overall cognitive status: Within functional limits for tasks assessed  SENSATION: WFL  POSTURE: rounded shoulders and forward head  PALPATION: No overt tenderness to palpation   CERVICAL ROM: WNL  LOWER EXTREMITY MMT:    MMT Right eval Left eval Right 07/15/23 Left 07/15/23  Hip flexion 3+ 3 4+ 3  Hip extension 3+ 3+    Hip abduction 3+ 3+ 4 4  Hip adduction      Hip internal rotation      Hip external rotation      Knee flexion 4- 3+    Knee extension 4- 3+ 4+ 4+  Ankle dorsiflexion   4+ 4+  Ankle plantarflexion      Ankle inversion      Ankle eversion       (Blank rows = not tested)   FUNCTIONAL TESTS:  Transfers:  Chair to bed: Multiple seated scoots with UEs  Sit<>stand: max A -- limited due to knee pain Supine to sit<>sit: min A for trunk and LE negotiation. Not performing log rolling  TODAY'S TREATMENT:                                                                                                                              DATE:   07/15/23  BP 182/97 on upper arm but cuff barely fit, on wrist error message on 175/109   FOTO, limited MMT due to elevated blood pressures  Lots of education today given danger of high BP especially in the context of  exercise, educated on progress with PT but that water PT is mostly likely to lead to meaningful gains in the big picture, educated on safe BP parameters and stressed importance of return to MD for medical management prior to starting water PT, safety limitations of session today. Educated to not try standing on his own at home due to high fall risk.       07/06/23  Seated LAQs 2.5# 1x10 B, 4# 1x10 B  Seated marches 2.5# x10 B Lateral trunk crunches x10 B Mini TA crunches at edge of table x10  Seated small range SLRs 2.5# x10 B Bridges 2x10 Supine clams green TB x15  Low load long duration HS stretches 1x60 seconds  Cues for mobility- head forward with sliding board transfers and good technique for bed mobility    06/30/23  Patient seen for aquatic therapy today.  Treatment took place in water 3.6-4.0 feet deep depending upon  activity.  Pt entered and exited the pool via chair lift; pt was transferred from his wheelchair to pool chair lift with mod A (+2 for safety) assist using slideboard when getting out of pool but without board getting onto lift chair.  Pool temp approx 92 deg.   Once in water, had pt grab onto wall for support while PT elevated lift chair back out of the water.  Secondary PT assisted floating to deeper end of pool as pt is tall and to make standing easier for pt.  Once in 4' water, assisted with getting feet under him and had him work to stand fully which he is able to do.  Provided pt with water walker and with +2A for safety (one PT ant/one PT post to pt) had him ambulate across pool x 6 laps with cues for ensuring trunk remains over feet and to "push down" through legs throughout, esp with turns as he initially would float during turn.  Pt with LE scissoring which seems to be spasticity related but this improved throughout session as well as knee pain decreased with increased walking.    Then placed him in float on secondary therapist while primary PT retrieved and  placed yellow noodles under lower legs and upper back.  Removed leg noodle once stable and performed supported floating hip abd/add x 2 sets of 10 reps and bicycles x 2 sets of 10 reps.  Note that R knee seems to be lacking more flexion than L during this task but when performing modified squat (in supine with feet against wall) we are able to bring him into quite a bit of flexion bilaterally.  Performed 5 push offs into knee/hip ext before reporting increased pain in knees therefore discontinued task.    Floated pt over to pool bench and had him grasp bench with B hands facing bench, removed noodle and assisting with turning to sit on bench.  Placed small step under feet for support.  Both PTs assisting on either side to get feet in flat position on step and had pt work to lean forward for LE weight bearing but not to stand.  Feel severe crepitus in B knees when performing task.  Pt reporting he has to lose about 100lbs in order to be eligible for knee replacement surgery.  Removed step and had him scoot forward so that feet were on floor (assist from PTs as needed) and had him place an arm around each PT for sit<>stand.  Cues and facilitation for forward trunk lean to improve ease and control of sit<>stand.  Performed 3 reps again with severe crepitus noted.    Pt then ambulated another 4 laps as before and then ambulated as able towards lift chair (floated when unable to ambulate).  Once out of pool, pt assisted back to chair via slideboard as stated above.    PTs to look at schedules and work to get pt in the pool again with +2 for safety before seeing as single therapist.  Pt and wife in agreement and wife stating she is willing to get into pool.     Pt requires buoyancy of water for support for reduced fall risk and for unloading/reduced stress on joints (B knees) as pt able to tolerate increased standing and ambulation in water compared to that on land; viscosity of water is needed for resistance for  strengthening and current of water provides perturbations for challenge for balance training      PATIENT EDUCATION:  Education details:  aquatic rationale Person educated: Patient Education method: Explanation, Demonstration, and Handouts Education comprehension: verbalized understanding, returned demonstration, and needs further education  HOME EXERCISE PROGRAM: Access Code: [email or web address] URL: [email or web address] Date: 04/21/2023 Prepared by: Vernon Prey April Kirstie Peri  Exercises - Staggered Bridge  - 1 x daily - 7 x weekly - 2 sets - 10 reps - Hooklying Isometric Clamshell  - 1 x daily - 7 x weekly - 2 sets - 10 reps - Small Range Straight Leg Raise  - 1 x daily - 7 x weekly - 2 sets - 10 reps  ASSESSMENT:  CLINICAL IMPRESSION:  Pt arrives doing OK today. Blood pressures were better than last time but still elevated (175/109 at wrist), lots of education given today as above. Cognition seemed a bit impaired today, his wife was present for a majority of the session and relays that per nutritionist scale, he did have a 20# gain. Given elevated BP and cognitive changes along with report of wt gain from nutritionist, feel that he does need to be seen by his MD for ongoing medical management prior to coming to pool PT and strongly educated on this today. Continue to feel that once medical team has resolved acute concerns about BP and fluid gain, he will really get max functional benefit from ongoing water PT.     OBJECTIVE IMPAIRMENTS: Abnormal gait, decreased balance, decreased endurance, decreased mobility, difficulty walking, decreased ROM, decreased strength, increased edema, improper body mechanics, postural dysfunction, obesity, and pain.   GOALS: Goals reviewed with patient? Yes  SHORT TERM GOALS: Target date: 08/12/2023     Pt will be ind with initial HEP Baseline:  Goal status: 05/06/23- provided, but inconsistent in performing. Ongoing 07/15/23  2.  Pt will be  able to tolerate standing x 1 min in the parallel bars with BUE support and CGA Baseline: 06/01/23- Goal updated after recent hospitalization Goal status: 07/15/23 unable to assess due to elevated BP  3.  Pt will be able to perform sliding board transfer with S, including set up, and up a slight (3") grade Baseline: 06/01/23- Goal updated after recent hospitalization Goal status:  07/15/23 PARTIALLY MET    LONG TERM GOALS: Target date: 09/09/2023     Pt will be ind with management and progression of HEP Baseline:  Goal status: ONGOING 07/15/23  2.  Pt will be able to perform stand-pivot transfers with RW and no more than MinA  Baseline:  Goal status: GOAL MODIFIED  07/15/23  3.  Pt will be able to perform at least 2 reps of sit to stand in 60 sec with UE support Baseline: Unable to perform complete STS without mod A Goal status: GOAL MODIFIED 07/15/23  4.  Pt will have increased FOTO to >/=54 Baseline:  Goal status: ONGOING 07/15/23  PLAN:  PT FREQUENCY: 1x/week  PT DURATION: 8 weeks  PLANNED INTERVENTIONS: 97164- PT Re-evaluation, 97110-Therapeutic exercises, 97530- Therapeutic activity, 97112- Neuromuscular re-education, 97535- Self Care, 66440- Manual therapy, [phone]- Gait training, [phone]- Orthotic Fit/training, [phone]- Aquatic Therapy, [phone]- Electrical stimulation (unattended), 97035- Ultrasound, [id number]- Ionotophoresis 4mg /ml Dexamethasone, Patient/Family education, Balance training, Stair training, Taping, Dry Needling, Joint mobilization, Spinal mobilization, Cryotherapy, and Moist heat  PLAN FOR NEXT SESSION: focus on water PT for maximal functional gains; closely monitor BP and overall medical status, education PRN   Nedra Hai, PT, DPT 07/15/23 1:23 PM

## 2023-07-15 NOTE — Therapy (Signed)
OUTPATIENT OCCUPATIONAL THERAPY NEURO Treatment  Patient Name: Raymond [name] MRN: [id number] DOB:1966-03-08, 58 y.o., male Today's Date: 07/15/2023  PCP: Dr. Tawanna Solo PROVIDER: Dr. Carlis Abbott  END OF SESSION:  OT End of Session - 07/15/23 1305     Visit Number 15    Number of Visits 32    Date for OT Re-Evaluation 08/24/23    Authorization Type cigna    Authorization Time Period 12 weeks    OT Start Time 1220    OT Stop Time 1230    OT Time Calculation (min) 10 min    Activity Tolerance Patient tolerated treatment well    Behavior During Therapy WFL for tasks assessed/performed                      Past Medical History:  Diagnosis Date   Acute kidney injury superimposed on chronic kidney disease (HCC) 04/13/2023   Acute on chronic anemia 04/13/2023   Anemia    Anxiety    Back pain    COPD (chronic obstructive pulmonary disease) (HCC)    Depression    Diabetes mellitus without complication (HCC)    Diverticulosis    with hx of LGIB   Edema    GERD (gastroesophageal reflux disease)    GIB (gastrointestinal bleeding)    Gout    Heavy smoker    Hyperlipidemia    Hypertension    Iron deficiency anemia    Sleep apnea    uses a cpap   Swelling of lower extremity    Past Surgical History:  Procedure Laterality Date   CHONDROPLASTY Left 06/28/2014   Procedure: CHONDROPLASTY;  Surgeon: Thera Flake., MD;  Location: [facility];  Service: Orthopedics;  Laterality: Left;   COLONOSCOPY     FOOT FASCIOTOMY     age 27-rt   KNEE ARTHROSCOPY WITH LATERAL MENISECTOMY Left 06/28/2014   Procedure: KNEE ARTHROSCOPY WITH LATERAL MENISECTOMY;  Surgeon: Thera Flake., MD;  Location: [facility];  Service: Orthopedics;  Laterality: Left;   KNEE ARTHROSCOPY WITH MEDIAL MENISECTOMY Left 06/28/2014   Procedure: LEFT KNEE ARTHROSCOPY CHONDROPLASTY/WITH MEDIAL/LATERAL MENISECTOMIES;  Surgeon: Thera Flake., MD;  Location:   SURGERY CENTER;  Service: Orthopedics;  Laterality: Left;   ORIF RADIUS & ULNA FRACTURES  2007   left   THORACIC DISCECTOMY N/A 03/12/2023   Procedure: THORACIC LAMINECTOMY AND  DISCECTOMY;  Surgeon: Coletta Memos, MD;  Location: [facility];  Service: Neurosurgery;  Laterality: N/A;   Patient Active Problem List   Diagnosis Date Noted   Cystitis 05/21/2023   Acute diverticulitis 05/21/2023   Vitamin D deficiency 05/14/2023   Low HDL (under 40) 05/14/2023   GERD (gastroesophageal reflux disease) 04/13/2023   Constipation 04/13/2023   Chronic back pain 04/13/2023   DVT, bilateral lower limbs (HCC) 04/13/2023   Coping style affecting medical condition 03/27/2023   Thoracic myelopathy with LE weakness 03/23/2023   HNP (herniated nucleus pulposus with myelopathy), thoracic 03/12/2023   Hyperlipidemia 12/24/2022   Failure to thrive in adult 12/24/2022   Tobacco abuse 03/18/2018   BPH associated with nocturia 03/11/2017   History of substance abuse (HCC) 03/11/2017   Restless leg syndrome 03/11/2017   Restrictive lung disease 05/14/2016   OSA on CPAP 05/14/2016   Moderate COPD (chronic obstructive pulmonary disease) (HCC) 05/14/2016   Smoking greater than 40 pack years 05/14/2016   SOB (shortness of breath) 10/06/2014   Edema of extremities 10/06/2014  Benign essential HTN 10/06/2014   Type 2 diabetes mellitus, without long-term current use of insulin (HCC) 07/18/2011    ONSET DATE: 04/08/23- referral date  REFERRING DIAG:  Diagnosis  M47.14 (ICD-10-CM) - Other spondylosis with myelopathy, thoracic region    THERAPY DIAG:  Muscle weakness (generalized) - Plan: Ot plan of care cert/re-cert,   Stiffness of right knee, not elsewhere classified - Plan: Ot plan of care cert/re-cert,   Other symptoms and signs involving the musculoskeletal system - Plan: Ot plan of care cert/re-cert,   Unsteadiness on feet - Plan: Ot plan of care cert/re-cert,   Other abnormalities of gait and  mobility - Plan: Ot plan of care cert/re-cert,   Pain in left upper arm - Plan: Ot plan of care cert/re-cert  Pain in right upper arm - Plan: Ot plan of care cert/re-cert  Stiffness of left shoulder, not elsewhere classified - Plan: Ot plan of care cert/re-cert  Stiffness of right shoulder, not elsewhere classified - Plan: Ot plan of care cert/re-cert  Rationale for Evaluation and Treatment: Rehabilitation  SUBJECTIVE:   SUBJECTIVE STATEMENT: Pt reports has not seen MD since Monday  Pt accompanied by: self, wife  PERTINENT HISTORY: 57 y.o. male presenting with increased low back pain after a fall. Pt was found to have thoracic myelopathy due to a disc herniation at T10/11. He is now s/p T10 laminectomy and Discectomy. Pt received therapies at [facility] and he d/c home 03/23/23 PMH of anxiety, COPD, DM, Edema, Gout, HTN, HLD, L medial menisectomy, ORIF L radius and ulna.  PRECAUTIONS: Back  WEIGHT BEARING RESTRICTIONS: No  PAIN:  Are you having pain? Yes: NPRS scale: 3/10 Pain location: shoulder,  Pain description: aching Aggravating factors: sitting still Relieving factors: meds  FALLS: Has patient fallen in last 6 months? Yes. Number of falls 1  LIVING ENVIRONMENT: Lives with: lives with their spouse Lives in: House/apartment Stairs:  has ramp Has following equipment at home: Walker - 2 wheeled and bed side commode  PLOF: Independent with basic ADLs  PATIENT GOALS: increase I with ADLS/ADLS  OBJECTIVE:  Note: Objective measures were completed at Evaluation unless otherwise noted.  HAND DOMINANCE: Right  ADLs: Overall ADLs: increased time required, uses w/c, pt is not able to ambulate or stand for ADLs at this time Transfers/ambulation related to ADLs: Eating: mod I Grooming: mod I UB Dressing: setup LB Dressing: mod A,  Toileting: uses bedside commode supervision-min A for scooting Bathing: sponge bathing, min A per pt report Tub Shower transfers: n/a   Equipment: bed side commode Unable to get into bathroom with w/c  IADLs: dependent with IADLS   MOBILITY STATUS:  supervision to min A for transfers  -scooting from w/c, pt is unable to stand for ADLs  Activitiy tolerance: 45 mins to hour with rest break    UPPER EXTREMITY ROM:    Active ROM Right eval Left eval  Shoulder flexion 90 110  Shoulder abduction 90 100  Shoulder adduction    Shoulder extension    Shoulder internal rotation    Shoulder external rotation    Elbow flexion [facility] WFL  Elbow extension [facility] WFL  Wrist flexion    Wrist extension    Wrist ulnar deviation    Wrist radial deviation    Wrist pronation    Wrist supination  90%  (Blank rows = not tested)  UPPER EXTREMITY MMT:     MMT Right eval Left eval  Shoulder flexion 3+/5 4/5  Shoulder abduction  Shoulder adduction    Shoulder extension    Shoulder internal rotation    Shoulder external rotation    Middle trapezius    Lower trapezius    Elbow flexion 4/5 4+/5  Elbow extension 4//5 4+/5  Wrist flexion    Wrist extension    Wrist ulnar deviation    Wrist radial deviation    Wrist pronation    Wrist supination    (Blank rows = not tested)  HAND FUNCTION: Grip strength: Right: 72 lbs; Left: 75 lbs  COORDINATION: 9 Hole Peg test: Right: 23.21 sec; Left: 32.52 sec  SENSATION: WFL      COGNITION: Overall cognitive status: Within functional limits for tasks assessed    OBSERVATIONS: Pleasant male is highly motivated to improve   TODAY'S TREATMENT:07/15/23- Pt's BP was elevated during PT appointment prior to OT session(see PT note) Exercise was with held due to elevated BP.Therapist met with pt'swife to discuss:BP management, and seeing PCP for CVA prevention. Importance of receiving an accurate weight to determine if he has fluid retition related to medical issues. Pt was made aware that he will not be able to be seen in the pool for aquatic therapy until medical issues are well  controlled as pool therapy can further increase BP. Discussed plans to place OT on hold unitl after pt.has worked in aquatic therapy and has better mobility/ standing tolerance. Therapist cautioned pt against vigoous activity/ exercises until BP is controlled. Theapist does not recommend resistive exercise with UE's at this time as pt is exercising UE's with all transfers and mobility. Pt has exercises for gentle ROM with cane. 07/13/23- Pt transferred to mat via sliding board with supervision and min v.c. for boar placement Pt reported he was dizzy. Therapist checked BP. Initial reading was 231 over 121. Pt took a brief rest and drank some water BP was 188/11/0. Therapist reviewed CVA warning signs with pt. and discussed importance of monitoring BP at home. Pt was instructed to call his MD if BP does not improve and diastolic remains greater than 161. Therapist called pt's wife. Pt had a broken break the supervisor/ PT fixed for pt before he left. PT left with his wife who was aware of pt's elevated BP. Therapist discussed plans with pt/ wife to place OT on hold after next visist.    07/06/23- Pt simulating donning / doffing pants x 3 reps seated edge of mat, min v.c/ facilitation initally then pt returned demonstration following practice  with pt leaning side to side to pull pants up over hips and use of reaching for donning pants over legs. Pt performed donning/ doffing shoes mod I Chest press, shoulder flexion low range 2 sets of 10 reps within pain free ROM Shoulder extension 2 sets of 10 reps, min v.c    06/24/23 Ultrasound 1 mhz, 1.0 w/cm 2, 20%  x 7 mins to right shoulder , no adverse reactions.  Ultrasound  to left shoulder/ , 1.0 w/cm 2, 20% x 7 mins for pain relief no adverse reactions.     Closed chain chest press and internal/ external rotation, pt performed without reports of pain Pt with 1 remaining scab on right shoulder, this area was avoided with application of ionto. Pt with  several small areas of mild pink coloration from previous ionto, ionto patches were moved to avoid these areas.  Iontophoresis Patch Set-up and Applied.  No adverse reactions observed while in clinic.  Type: Desamethasone Location: bilateral shoulders Dose: 9mA/min, 1mL patch Time: 4hr  patch- pt was instructed when to remove Pt was monitored in clinic x 5 mins, no adverse reactions Therapist discussed with pt the improtance of activity modification and minimizing stress on the shoulders as much as possible.  06/22/23-Ultrasound 1 mhz, 1.0 w/cm 2, 20%  x 7 mins to right shoulder and biceps, no adverse reactions.  Ultrasound  to left shoulder/ biceps , 1.0 w/cm 2, 20% x 7 mins for pain relief no adverse reactions.    Pt reports decreased pain following [place]. Closed chain chest press and internal external rotation, pt perfromed without reports of pain Pt with 1 remaining scab on right shoulder, this area was avoided with application of ionto. Iontophoresis Patch Set-up and Applied.  No adverse reactions observed while in clinic.  Type: Desamethasone Location: bilateral shoulders Dose: 24mA/min, 1mL patch Time: 4hr patch Pt was monitored in clinic x 5 mins, no adverse reactions   06/17/23 Pt attempted closed chain shoulder flexion with cane however this increased pain so discontinued. Internal external rotation with cane, 20 reps mod v.c no increased pain Tableslides for shoulder flexion bilateral UE's no pain 10-20 reps as well as shoulder horizontal abduction, min v.c. Pt reports his shoulders feel better today s/p ionto yesterday. Pt with one scab from small blister on right anterior shoulder, therapist avoided this area when applying ionto today. Iontophoresis Patch Set-up and Applied.  No adverse reactions observed while in clinic.  Type: Desamethasone Location: bilateral shoulders Dose: 69mA/min, 1mL patch Time: 4hr patch Pt was moniored in the clinic x 10 mins s/p application while  therpist discussed ADL strategies with pt. He was instructed to remove the patch in 4 hrs at 6:30. Pt practiced donning/ doffing shoe with reacher and shoe horn. Pt completed task with max difficulty, min v.c.                                                                                                         06/16/23  Ultrasound 1 mhz, 8.0 w/cm 2, 20%  x 8 mins to right shoulder and biceps, no adverse reactions.  Ultrasound  to left shoulder/ biceps , 1.0 w/cm 2, 20% x 8 mins for pain relief no adverse reactions.      Iontophoresis Patch Set-up and Applied.  No adverse reactions observed while in clinic.  Type: Desamethasone Location: bilateral shoulders Dose: 58mA/min, 1mL patch Time: 4hr patch Pt was monitored in the clinic x 5 mins s/p application. He was instructed to remove the patch in 4 hrs at 1:30.  06/04/23:    Ultrasound 1 mhz, 1.0 w/cm 2, 20%  x 8 mins to right shoulder and biceps, no adverse reactions.  Ultrasound  to left shoulder, , 1.0 w/cm 2, 20% x 8 mins for pain relief no adverse reactions.   Closed-chain shoulder flex with BUEs in pain-free range with min cueing for positioning/sholder hike  Iontophoresis Patch Set-up and Applied.  No adverse reactions observed while in clinic.  Pt/wife instructed in [place] instructions/precautions and contraindications Type: Desamethasone Location: bilateral shoulders Dose: 31mA/min, 1mL patch Time: 4hr patch    06/01/23 Therapist checked progress towards goals as pt was recently hospitalize for diverticulitis.  Pt demonstrates bilateral shoulder flexion to 70* prior to significant pain. He reports increased bilateral shoulder pain and he has been cautioned to limit repetative use of bilateral UE's and to avoid biceps curls. [place] 1 mhz, 1.0 w/cm 2, 20%  x 8 mins to right shoulder and biceps, no adverse reactions. Ice pack  applied to R shoulder x 7 mins while pt recived UE to left shoulder. No adverse reactions. Pt reports he is nearly pain free afterwards. [place] to left shoulder, , 1.0 w/cm 2, 20% x 7 mins for pain relief no adverse reactions. Therapist discussed use of Iontophoreis with pt and he is agreeable. Therapist to request order.  05/20/23- US:1- mhz,  1.0 w/cm 2, 50%, x 10 mins  for L biceps tendon and biceps and left lateral/ anterior shoulder and left shoulder pain, no adverse reactions  Ice pack applied to left shoulder x 8 min  Pt reports pain was improved to 2-3 /10 at end of session  05/14/23-UBE x 6 mins level 3 for conditioning Pt reports difficulty with bathing his bottom and hygine. Therapist discussed with pt/ wife and provided information regarding toilet aide, handheld bidet and long handled sponge. therapist recommends pt does as much as he can then has assistnace from his wife for thoroughness. Therapist also discussed with pt/ wife activies pt can perform at home at w/c level: folding clothes, wiping kitchen counter, and retieveing a snack for the fridge. Closed chain shoulder flexion with cane, to 90*, 20 reps min v.c Biceps curls and triceps extension 15 reps each, min v.c  05/11/23-UBE x 6 mins level 2 for conditioning Biceps curls 2 sets of 10-15 reps with 2 lbs hand weights, triceps ext with red band, min v.c 15 reps each UE Closed chain shoulder flexion with cane 15 reps to shoulder height, min v.c to avoid shoulder hike, then 15 reps with 2 lbs cane  Pt practiced scooting along edge of mat with emphasis on lifting up with arms and legs before scoot, min v.c Pt simulated donning pants with reacher and min v.c and demonstration. Pt transferred back to w/c via sliding board with supervision min-mod v.c  05/07/23 UBE x 6 mins level 3 for conditioning Pt was instructed in updates to HEP, for bilateral UE's min v.c and demonstration 10 reps each, red band and cane were used see pt  instuctions.  05/06/23- UBE x 6 mins level 2 for conditioning, seated in w/c Pt transferred scoot pivot without sliding board to mat with supervision Seated on mat closed chain shoulder flexion and biceps curls 10 reps each, min v.c  Biceps curls x 20 reps with 3lbs weight bilateral UE's, min v.c shoulder flexion bilateal UE's with 2 lbs weight in each hand, 15 reps. Pt practiced scooting on mat with cueing to shift weight fowrard and pushdown through hands to scoot, pt also  practiced using this technique for a sliding board transfer back to w/c. min v.c Pt's wife rports pt an't go into the kitchen because he can't remove his leg rests. Pt practiced removing and reattaching w/c leg rests with min v.c the pt practiced walking his w/c forwards with legs while assisting aby pushing wheels. see education  04/22/23 eval  PATIENT EDUCATION: Education details: importance of managing BP and medical issues for CVA prevention. Therpist recommends pt sees PCP for management of BP. Pt [place] need to see weight loss for accurate weight as these is some question regarding pt's weight and possible weight gain with fluid. Person educated: Patient , wife Education method: Explanation,  Education comprehension: verbalized understanding,  HOME EXERCISE PROGRAM:  red band and vertical cane 05/07/23   GOALS: Goals reviewed with patient? Yes  SHORT TERM GOALS: Target date: 07/01/22  I with inital HEP.  Goal status: met  2.  Pt will peform LB dressing with min A  Goal status: mod i with pants, min a with socks and shoes at times, 07/15/23  3.  Pt will consistently perform toilet transfers with supervision.  Goal status: ongoing, supervision with transfer, min A hygeine  4.  Pt will perform snack or beverage prep at a w/c level  Goal status: ongoing,pt is not able to access kitchen at w/c level 07/15/23  5. Pt will verbalize understanding of back precautions.  Goal status: met    LONG TERM GOALS:  Target date: 08/24/22  I with updated HEP. Baseline:  Goal status:  ongoing- cane exercises are appropriate at this time as pt is using UE's for all mobility/ transfers at this time  2.  Pt will perform bathing with supervision/ set up  Goal status:  ongoing, min A  3.  Pt will perform LB dressing with supervision/ set up.  Goal status: partially met Pt is able to donn pants, requires min A with with socks / shoes at times  4.  Pt will demonstrate ability with stand for 5 mins with min A for ADLs/ IADLs.  Goal status:  ongoing, not met  5.  Pt will perfrom simple home management at a walker level with min A.  Goal status:  ongoing, not met, unable   ASSESSMENT:    CLINICAL IMPRESSION: Pt treatment today was limited today by elevated BP. Pt's therapy to be placed on hold for at least 4 weeks to allow him to get BP under control and to work with aquatic PT for increased mobility.   PERFORMANCE DEFICITS: in functional skills including ADLs, IADLs, ROM, strength, pain, flexibility, mobility, balance, body mechanics, endurance, decreased knowledge of precautions, decreased knowledge of use of DME, and UE functional use,  and psychosocial skills including coping strategies, environmental adaptation, habits, interpersonal interactions, and routines and behaviors.   IMPAIRMENTS: are limiting patient from ADLs, IADLs, rest and sleep, work, play, leisure, and social participation.   CO-MORBIDITIES: [place] have co-morbidities  that affects occupational performance. Patient will benefit from skilled OT to address above impairments and improve overall function.  MODIFICATION OR ASSISTANCE TO COMPLETE EVALUATION: No modification of tasks or assist necessary to complete an evaluation.  OT OCCUPATIONAL PROFILE AND HISTORY: Detailed assessment: Review of records and additional review of physical, cognitive, psychosocial history related to current functional performance.  CLINICAL DECISION MAKING:  LOW - limited treatment options, no task modification necessary  REHAB POTENTIAL: Good  EVALUATION COMPLEXITY: Low    PLAN:  OT FREQUENCY: 2x/week  OT DURATION: 12 weeks  plus eval  PLANNED INTERVENTIONS: 97168 OT Re-evaluation, 97535 self care/ADL training, 14782 therapeutic exercise, 97530 therapeutic activity, 97112 neuromuscular re-education, 97140 manual therapy, 97116 gait training, 95621 aquatic therapy, 97035 ultrasound, 97018 paraffin, 30865 moist heat, 97010 cryotherapy, passive range of motion, balance training, stair training, functional mobility training, psychosocial skills training, energy conservation, coping strategies training, patient/family education, and DME and/or AE instructions  RECOMMENDED OTHER SERVICES: PT  CONSULTED AND AGREED WITH PLAN OF CARE: Patient  PLAN FOR NEXT SESSION:   place therapy on hold until pt's mobility improves, and BP issues are resolved   Leotha Westermeyer, OTR/L 07/15/2023, 1:05 PM

## 2023-07-22 ENCOUNTER — Ambulatory Visit
Admission: RE | Admit: 2023-07-22 | Discharge: 2023-07-22 | Disposition: A | Discharge status: Home / Self Care | Payer: No Typology Code available for payment source | Source: Ambulatory Visit | Attending: Neurosurgery | Admitting: Neurosurgery

## 2023-07-22 DIAGNOSIS — M5104 Intervertebral disc disorders with myelopathy, thoracic region: Secondary | ICD-10-CM

## 2023-07-22 MED ORDER — GADOPICLENOL 0.5 MMOL/ML IV SOLN
10.0000 mL | Freq: Once | INTRAVENOUS | Status: DC | PRN
Start: 2023-07-22 — End: 2023-07-23

## 2023-07-27 ENCOUNTER — Other Ambulatory Visit: Payer: Self-pay | Admitting: Physical Medicine and Rehabilitation

## 2023-07-27 ENCOUNTER — Other Ambulatory Visit: Payer: Self-pay | Admitting: Internal Medicine

## 2023-07-27 ENCOUNTER — Other Ambulatory Visit: Payer: Self-pay | Admitting: Neurosurgery

## 2023-07-27 DIAGNOSIS — M5104 Intervertebral disc disorders with myelopathy, thoracic region: Secondary | ICD-10-CM

## 2023-07-27 NOTE — Telephone Encounter (Unsigned)
 Patient to contact ordering provider for refill.     Copied from CRM [id number]. Topic: Clinical - Medication Refill >> Jul 27, 2023  3:21 PM Priscille Loveless wrote: Most Recent Primary Care Visit:   Medication: [phone]  Has the patient contacted their pharmacy? Yes  Is this the correct pharmacy for this prescription? Yes  This is the patient's preferred pharmacy:  [facility] [address] [place] - [address] [place] [address] Phone: [phone] Fax: [phone]     Has the prescription been filled recently? Yes  Is the patient out of the medication? Yes  Has the patient been seen for an appointment in the last year OR does the patient have an upcoming appointment? Yes  Can we respond through MyChart? No  Agent: Please be advised that Rx refills [place] take up to 3 business days. We ask that you follow-up with your pharmacy.

## 2023-07-28 ENCOUNTER — Ambulatory Visit (INDEPENDENT_AMBULATORY_CARE_PROVIDER_SITE_OTHER): Payer: No Typology Code available for payment source | Admitting: Bariatrics

## 2023-07-28 ENCOUNTER — Encounter: Payer: Self-pay | Admitting: Bariatrics

## 2023-07-28 ENCOUNTER — Telehealth: Payer: Self-pay

## 2023-07-28 ENCOUNTER — Other Ambulatory Visit: Payer: Self-pay | Admitting: Neurosurgery

## 2023-07-28 VITALS — BP 140/77 | HR 66 | Temp 98.1°F | Ht 71.0 in | Wt 336.0 lb

## 2023-07-28 DIAGNOSIS — Z6841 Body Mass Index (BMI) 40.0 and over, adult: Secondary | ICD-10-CM

## 2023-07-28 DIAGNOSIS — E119 Type 2 diabetes mellitus without complications: Secondary | ICD-10-CM

## 2023-07-28 DIAGNOSIS — M5104 Intervertebral disc disorders with myelopathy, thoracic region: Secondary | ICD-10-CM

## 2023-07-28 DIAGNOSIS — I10 Essential (primary) hypertension: Secondary | ICD-10-CM | POA: Diagnosis not present

## 2023-07-28 DIAGNOSIS — E1122 Type 2 diabetes mellitus with diabetic chronic kidney disease: Secondary | ICD-10-CM

## 2023-07-28 DIAGNOSIS — Z7985 Long-term (current) use of injectable non-insulin antidiabetic drugs: Secondary | ICD-10-CM

## 2023-07-28 MED ORDER — TIRZEPATIDE 12.5 MG/0.5ML ~~LOC~~ SOAJ: 12.5000 mg | SUBCUTANEOUS | 0 refills | Status: DC

## 2023-07-28 NOTE — Telephone Encounter (Signed)
 PA submitted through Cover My Meds for [facility]. Awaiting insurance determination. Key: [id number]

## 2023-07-28 NOTE — Progress Notes (Signed)
 WEIGHT SUMMARY AND BIOMETRICS  Weight lost since the last visit: 6 lbs. Today's weight with wheelchair (28 lbs).    Vitals Temp: 98.1 F (36.7 C) BP: (!) 140/77 Pulse Rate: 66 SpO2: 99 %   Anthropometric Measurements Height: 5\' 11"  (1.803 m) Weight: (!) 364 lb 13.8 oz (165.5 kg) (weight includes wheelchair) BMI (Calculated): 50.91 Weight at Last Visit: 342lb Weight Lost Since Last Visit: 0lb Weight Gained Since Last Visit: 22lb Starting Weight: 326lb Total Weight Loss (lbs): 0 lb (0 kg)   No data recorded Other Clinical Data Fasting: No Labs: No Today's Visit #: 5 Starting Date: 05/13/23    OBESITY Raymond [name] is here to discuss his progress with his obesity treatment plan along with follow-up of his obesity related diagnoses.    Nutrition Plan: the Category 4 plan - 0% adherence.  Current exercise: none  Interim History:  He saw his PCP and is working with him for his primary care needs.  Eating all of the food on the plan., Protein intake is as prescribed, Is exceeding snack calorie allotment, Is skipping meals, and Water intake is adequate.   Pharmacotherapy: Davionte is on Mounjaro 10 mg SQ weekly Adverse side effects: None Hunger is moderately controlled.  Cravings are moderately controlled.  Assessment/Plan:   Type II Diabetes HgbA1c is at goal. Last A1c was 6.0 CBGs: Not checking      Episodes of hypoglycemia: no He states that his appetite and cravings are higher.  Medication(s): Mounjaro 10 mg SQ weekly  Lab Results  Component Value Date   HGBA1C 6.0 (H) 03/12/2023   HGBA1C 6.3 (H) 12/25/2022   Lab Results  Component Value Date   LDLCALC 77 05/13/2023   CREATININE 1.87 (H) 05/25/2023   Lab Results  Component Value Date   GFR 92.19 10/05/2014    Plan: Continue and increase dose Mounjaro 12.5 mg SQ weekly Continue all other  medications.  Will keep all carbohydrates low both sweets and starches.  Will continue exercise regimen to 30 to 60 minutes on most days of the week.  Aim for 7 to 9 hours of sleep nightly.  Eat more low glycemic index foods.  Reviewed his plan and will adhere to the plan at least 80 to 90 %.  New snack ideas and recipes given.    Hypertension His PCP has changed his blood pressure medication, and his blood pressure is improving.  Hypertension improved.  Medication(s): Coreg 25 mg twice daily  and Diovan-HCT 80-12.5 mg daily  BP Readings from Last 3 Encounters:  07/28/23 (!) 140/77  06/30/23 (!) 167/66  06/16/23 (!) 179/100   Lab Results  Component Value Date   CREATININE 1.87 (H) 05/25/2023   CREATININE 1.85 (H) 05/24/2023   CREATININE 1.59 (H) 05/23/2023   Lab Results  Component Value Date   GFR 92.19 10/05/2014    Plan: Continue all  antihypertensives at current dosages. No added salt. Will keep sodium content to 1,500 mg or less per day.   He will check his blood pressures at home.      Morbid Obesity: Current BMI BMI (Calculated): 50.91   Pharmacotherapy Plan Continue and increase dose  Mounjaro 12.5 mg SQ weekly  Maddix is currently in the action stage of change. As such, his goal is to continue with weight loss efforts.  He has agreed to the Category 4 plan.  Exercise goals: Older adults should follow the adult guidelines. When older adults cannot meet the adult guidelines, they should be as physically active as their abilities and conditions will allow.   Behavioral modification strategies: increasing lean protein intake, decreasing simple carbohydrates , no meal skipping, meal planning , better snacking choices, planning for success, increasing fiber rich foods, and keep healthy foods in the home.  Ibrohim has agreed to follow-up with our clinic in 3 weeks.       Objective:   VITALS: Per patient if applicable, see vitals. GENERAL: Alert and in no acute  distress. CARDIOPULMONARY: No increased WOB. Speaking in clear sentences.  PSYCH: Pleasant and cooperative. Speech normal rate and rhythm. Affect is appropriate. Insight and judgement are appropriate. Attention is focused, linear, and appropriate.  NEURO: Oriented as arrived to appointment on time with no prompting.   Attestation Statements:    Time spent on visit including the items listed below was 32 minutes.  -preparing to see the patient (e.g., review of tests, history, previous notes) -obtaining and/or reviewing separately obtained history -counseling and educating the patient/family/caregiver -documenting clinical information in the electronic or other health record -ordering medications, tests, or procedures -independently interpreting results and communicating results to the patient/ family/caregiver -referring and communicating with other health care professionals  -care coordination   This was prepared with the assistance of Engineer, civil (consulting).  Occasional wrong-word or sound-a-like substitutions Cygan have occurred due to the inherent limitations of voice recognition   Corinna Capra, DO

## 2023-07-29 ENCOUNTER — Other Ambulatory Visit: Payer: Self-pay | Admitting: Bariatrics

## 2023-07-29 ENCOUNTER — Ambulatory Visit
Admission: RE | Admit: 2023-07-29 | Discharge: 2023-07-29 | Disposition: A | Discharge status: Home / Self Care | Payer: No Typology Code available for payment source | Source: Ambulatory Visit | Attending: Neurosurgery | Admitting: Neurosurgery

## 2023-07-29 DIAGNOSIS — M5104 Intervertebral disc disorders with myelopathy, thoracic region: Secondary | ICD-10-CM

## 2023-07-29 MED ORDER — GADOPICLENOL 0.5 MMOL/ML IV SOLN
10.0000 mL | Freq: Once | INTRAVENOUS | Status: AC | PRN
Start: 2023-07-29 — End: 2023-07-29
  Administered 2023-07-29: 10 mL via INTRAVENOUS

## 2023-08-05 ENCOUNTER — Ambulatory Visit: Payer: No Typology Code available for payment source | Admitting: Physical Therapy

## 2023-08-06 ENCOUNTER — Ambulatory Visit: Payer: No Typology Code available for payment source | Admitting: Occupational Therapy

## 2023-08-06 ENCOUNTER — Encounter: Attending: Physical Medicine and Rehabilitation | Admitting: Physical Medicine and Rehabilitation

## 2023-08-06 VITALS — BP 141/83 | HR 78 | Ht 71.0 in | Wt 332.0 lb

## 2023-08-06 DIAGNOSIS — L989 Disorder of the skin and subcutaneous tissue, unspecified: Secondary | ICD-10-CM

## 2023-08-06 DIAGNOSIS — M4714 Other spondylosis with myelopathy, thoracic region: Secondary | ICD-10-CM | POA: Diagnosis present

## 2023-08-06 DIAGNOSIS — I825Z3 Chronic embolism and thrombosis of unspecified deep veins of distal lower extremity, bilateral: Secondary | ICD-10-CM

## 2023-08-06 DIAGNOSIS — Z6830 Body mass index (BMI) 30.0-30.9, adult: Secondary | ICD-10-CM | POA: Insufficient documentation

## 2023-08-06 DIAGNOSIS — E66811 Obesity, class 1: Secondary | ICD-10-CM | POA: Insufficient documentation

## 2023-08-06 DIAGNOSIS — Z5181 Encounter for therapeutic drug level monitoring: Secondary | ICD-10-CM

## 2023-08-06 DIAGNOSIS — I509 Heart failure, unspecified: Secondary | ICD-10-CM | POA: Diagnosis present

## 2023-08-06 DIAGNOSIS — G8918 Other acute postprocedural pain: Secondary | ICD-10-CM

## 2023-08-06 DIAGNOSIS — I10 Essential (primary) hypertension: Secondary | ICD-10-CM | POA: Diagnosis present

## 2023-08-06 DIAGNOSIS — G894 Chronic pain syndrome: Secondary | ICD-10-CM

## 2023-08-06 DIAGNOSIS — Z79891 Long term (current) use of opiate analgesic: Secondary | ICD-10-CM

## 2023-08-06 NOTE — Progress Notes (Signed)
 Subjective:     Patient ID: Raymond [name], male   DOB: Klayman 25, 1967, 58 y.o.   MRN: [id number]  HPI   Mr. Sluka is a 58 year old man who presents for f/u up of thoracic myelopathy.  1) Thoracic myelopathy: -improving slowly but surely -transferring independently  -spends a lot of time in the recliner  2) HTN:  -hydrochlorothiazide added- he got one last night and this morning  3) Left flank nodule:  -painful at times.   4) Postoperative pain: -pain has been severe -spasticity is part of the pain -he is almost out of oxycodone -he does not have pain contract or urine sample performed with [place] -patient states that his neurosurgeon recommended increasing his pain medication -the baclofen does help -takes tizanidine 2mg  at night  Pain Inventory Average Pain 7 Pain Right Now 7 My pain is intermittent, sharp, burning, dull, stabbing, tingling, and aching  In the last 24 hours, has pain interfered with the following? General activity 7 Relation with others 0 Enjoyment of life 7 What TIME of day is your pain at its worst? morning , daytime, evening, night, and varies Sleep (in general) Poor  Pain is worse with: sitting and some activites Pain improves with: rest, heat/ice, therapy/exercise, and medication Relief from Meds: 3  ability to climb steps?  no do you drive?  no use a wheelchair transfers alone Do you have any goals in this area?  yes  employed # of hrs/week medical leave I need assistance with the following:  dressing, bathing, meal prep, household duties, and shopping Do you have any goals in this area?  yes  weakness numbness tremor tingling trouble walking spasms depression anxiety  Any changes since last visit?  no  Any changes since last visit?  no    Family History  Problem Relation Age of Onset   Diabetes Mother    Hypertension Mother    High Cholesterol Mother    Heart disease Mother    Depression Mother    Diabetes Father    Hypertension  Father    High Cholesterol Father    Heart disease Father    Cancer Other    Social History   Socioeconomic History   Marital status: Single    Spouse name: Not on file   Number of children: Not on file   Years of education: Not on file   Highest education level: Not on file  Occupational History   Not on file  Tobacco Use   Smoking status: Every Day    Current packs/day: 2.00    Types: Cigarettes   Smokeless tobacco: Never  Vaping Use   Vaping status: Former  Substance and Sexual Activity   Alcohol use: Yes    Comment: rare   Drug use: No   Sexual activity: Not on file  Other Topics Concern   Not on file  Social History Narrative   Not on file   Social Drivers of Health   Financial Resource Strain: Not on file  Food Insecurity: No Food Insecurity (05/21/2023)   Hunger Vital Sign    Worried About Running Out of Food in the Last Year: Never true    Ran Out of Food in the Last Year: Never true  Transportation Needs: No Transportation Needs (05/21/2023)   PRAPARE - Administrator, Civil Service (Medical): No    Lack of Transportation (Non-Medical): No  Physical Activity: Not on file  Stress: No Stress Concern Present (03/21/2022)  Received from [employer], [facility]   [employer] of Occupational [facility] - Occupational Stress Questionnaire    Feeling of Stress : Not at all  Social Connections: Unknown (01/10/2022)   Received from [facility], [facility]   Social Network    Social Network: Not on file   Past Surgical History:  Procedure Laterality Date   CHONDROPLASTY Left 06/28/2014   Procedure: CHONDROPLASTY;  Surgeon: Thera Flake., MD;  Location: [facility];  Service: Orthopedics;  Laterality: Left;   COLONOSCOPY     FOOT FASCIOTOMY     age 40-rt   KNEE ARTHROSCOPY WITH LATERAL MENISECTOMY Left 06/28/2014   Procedure: KNEE ARTHROSCOPY WITH LATERAL MENISECTOMY;  Surgeon: Thera Flake., MD;  Location: [facility];  Service: Orthopedics;  Laterality: Left;   KNEE ARTHROSCOPY WITH MEDIAL MENISECTOMY Left 06/28/2014   Procedure: LEFT KNEE ARTHROSCOPY CHONDROPLASTY/WITH MEDIAL/LATERAL MENISECTOMIES;  Surgeon: Thera Flake., MD;  Location: [facility];  Service: Orthopedics;  Laterality: Left;   ORIF RADIUS & ULNA FRACTURES  2007   left   THORACIC DISCECTOMY N/A 03/12/2023   Procedure: THORACIC LAMINECTOMY AND  DISCECTOMY;  Surgeon: Coletta Memos, MD;  [facility];  Service: Neurosurgery;  Laterality: N/A;   Past Medical History:  Diagnosis Date   Acute kidney injury superimposed on chronic kidney disease (HCC) 04/13/2023   Acute on chronic anemia 04/13/2023   Anemia    Anxiety    Back pain    COPD (chronic obstructive pulmonary disease) (HCC)    Depression    Diabetes mellitus without complication (HCC)    Diverticulosis    with hx of LGIB   Edema    GERD (gastroesophageal reflux disease)    GIB (gastrointestinal bleeding)    Gout    Heavy smoker    Hyperlipidemia    Hypertension    Iron deficiency anemia    Sleep apnea    uses a cpap   Swelling of lower extremity    BP (!) 141/83   Pulse 78   Ht 5\' 11"  (1.803 m)   Wt (!) 332 lb (150.6 kg)   SpO2 97%   BMI 46.30 kg/m   Opioid Risk Score:   Fall Risk Score:  `1  Depression screen PHQ 2/9      No data to display          Review of Systems  Musculoskeletal:  Positive for gait problem. Negative for back pain.       Spasms Pain in both hips into buttocks down both legs  Neurological:  Positive for tremors, weakness and numbness.       Tingling  Psychiatric/Behavioral:         Depression, anxiety  All other systems reviewed and are negative.      Objective:   Physical Exam Gen: no distress, normal appearing HEENT: oral mucosa pink and moist, NCAT Cardio: Reg rate Chest: normal effort, normal rate of breathing Abd: soft, non-distended Ext: no edema Psych: pleasant, normal  affect Skin: intact Neuro: Alert and oriented x3, wheelchair bound     Assessment:         Plan:   1) Thoracic myelopathy: -continue therapy -discussed his postoperative pain -discussed that we need pain contact and urine sample before we can prescribe opioids for him, increased baclofen to 10mg  QID  2) Obesity: -Educated regarding health benefits of weight loss- for pain, general health, chronic disease prevention, immune health, mental health.  -  Will monitor weight every visit.  -Consider Roobois tea daily.  -Discussed the benefits of intermittent fasting. -Discussed foods that can assist in weight loss: 1) leafy greens- high in fiber and nutrients 2) dark chocolate- improves metabolism (if prefer sweetened, best to sweeten with honey instead of sugar).  3) cruciferous vegetables- high in fiber and protein 4) full fat yogurt: high in healthy fat, protein, calcium, and probiotics 5) apples- high in a variety of phytochemicals 6) nuts- high in fiber and protein that increase feelings of fullness 7) grapefruit: rich in nutrients, antioxidants, and fiber (not to be taken with anticoagulation) 8) beans- high in protein and fiber 9) salmon- has high quality protein and healthy fats 10) green tea- rich in polyphenols 11) eggs- rich in choline and vitamin D 12) tuna- high protein, boosts metabolism 13) avocado- decreases visceral abdominal fat 14) chicken (pasture raised): high in protein and iron 15) blueberries- reduce abdominal fat and cholesterol 16) whole grains- decreases calories retained during digestion, speeds metabolism 17) chia seeds- curb appetite 18) chilies- increases fat metabolism  -Discussed supplements that can be used:  1) Metatrim 400mg  BID 30 minutes before breakfast and dinner  2) Sphaeranthus indicus and Garcinia mangostana (combinations of these and #1 can be found in capsicum and zychrome  3) green coffee bean extract 400mg  twice per day or Irvingia  (african mango) 150 to 300mg  twice per day.   3) HTN: -increase tizandine to 4mg  HS -continue hydrochlorothiazide -resume therapy because BP is better controlled -Advised checking BP daily at home and logging results to bring into follow-up appointment with PCP and myself. -Reviewed BP meds today.  -Advised regarding healthy foods that can help lower blood pressure and provided with a list: 1) citrus foods- high in vitamins and minerals 2) salmon and other fatty fish - reduces inflammation and oxylipins 3) swiss chard (leafy green)- high level of nitrates 4) pumpkin seeds- one of the best natural sources of magnesium 5) Beans and lentils- high in fiber, magnesium, and potassium 6) Berries- high in flavonoids 7) Amaranth (whole grain, can be cooked similarly to rice and oats)- high in magnesium and fiber 8) Pistachios- even more effective at reducing BP than other nuts 9) Carrots- high in phenolic compounds that relax blood vessels and reduce inflammation 10) Celery- contain phthalides that relax tissues of arterial walls 11) Tomatoes- can also improve cholesterol and reduce risk of heart disease 12) Broccoli- good source of magnesium, calcium, and potassium 13) Greek yogurt: high in potassium and calcium 14) Herbs and spices: Celery seed, cilantro, saffron, lemongrass, black cumin, ginseng, cinnamon, cardamom, sweet basil, and ginger 15) Chia and flax seeds- also help to lower cholesterol and blood sugar 16) Beets- high levels of nitrates that relax blood vessels  17) spinach and bananas- high in potassium  -Provided lise of supplements that can help with hypertension:  1) magnesium: one high quality brand is Bioptemizers since it contains all 7 types of magnesium, otherwise over the counter magnesium gluconate 400mg  is a good option 2) B vitamins 3) vitamin D 4) potassium 5) CoQ10 6) L-arginine 7) Vitamin C 8) Beetroot -Educated that goal BP is 120/80. -Made goal to incorporate  some of the above foods into diet.    4) Diabetes: -check CBGs daily, log, and bring log to follow-up appointment -avoid sugar, bread, pasta, rice -avoid snacking -perform daily foot exam and at least annual eye exam -try to incorporate into your diet some of the following foods which are good for diabetes:  1) cinnamon- imitates effects of insulin, increasing glucose transport into cells ([place] or [place] cinnamon is best, least processed) 2) nuts- can slow down the blood sugar response of carbohydrate rich foods 3) oatmeal- contains and anti-inflammatory compound avenanthramide 4) whole-milk yogurt (best types are no sugar, [place] yogurt, or goat/sheep yogurt) 5) beans- high in protein, fiber, and vitamins, low glycemic index 6) broccoli- great source of vitamin A and C 7) quinoa- higher in protein and fiber than other grains 8) spinach- high in vitamin A, fiber, and protein 9) olive oil- reduces glucose levels, LDL, and triglycerides 10) salmon- excellent amount of omega-3-fatty acids 11) walnuts- rich in antioxidants 12) apples- high in fiber and quercetin 13) carrots- highly nutritious with low impact on blood sugar 14) eggs- improve HDL (good cholesterol), high in protein, keep you satiated 15) turmeric: improves blood sugars, cardiovascular disease, and protects kidney health 16) garlic: improves blood sugar, blood pressure, pain 17) tomatoes: highly nutritious with low impact on blood sugar   5) Chronic Pain: -discussed getting pain contract and urine sample today, if contains expected metabolities, will prescribe percocet BID prn -Discussed current symptoms of pain and history of pain.  -Discussed benefits of exercise in reducing pain. -Discussed following foods that Arave reduce pain: 1) Ginger (especially studied for arthritis)- reduce leukotriene production to decrease inflammation 2) Blueberries- high in phytonutrients that decrease inflammation 3) Salmon- marine  omega-3s reduce joint swelling and pain 4) Pumpkin seeds- reduce inflammation 5) dark chocolate- reduces inflammation 6) turmeric- reduces inflammation 7) tart cherries - reduce pain and stiffness 8) extra virgin olive oil - its compound olecanthal helps to block prostaglandins  9) chili peppers- can be eaten or applied topically via capsaicin 10) mint- helpful for headache, muscle aches, joint pain, and itching 11) garlic- reduces inflammation  Link to further information on diet for chronic pain: [email or web address]   6) Constipation:  -Provided list of following foods that help with constipation and highlighted a few: 1) prunes- contain high amounts of fiber.  2) apples- has a form of dietary fiber called pectin that accelerates stool movement and increases beneficial gut bacteria 3) pears- in addition to fiber, also high in fructose and sorbitol which have laxative effect 4) figs- contain an enzyme ficin which helps to speed colonic transit 5) kiwis- contain an enzyme actinidin that improves gut motility and reduces constipation 6) oranges- rich in pectin (like apples) 7) grapefruits- contain a flavanol naringenin which has a laxative effect 8) vegetables- rich in fiber and also great sources of folate, vitamin C, and K 9) artichoke- high in inulin, prebiotic great for the microbiome 10) chicory- increases stool frequency and softness (can be added to coffee) 11) rhubarb- laxative effect 12) sweet potato- high fiber 13) beans, peas, and lentils- contain both soluble and insoluble fiber 14) chia seeds- improves intestinal health and gut flora 15) flaxseeds- laxative effect 16) whole grain rye bread- high in fiber 17) oat bran- high in soluble and insoluble fiber 18) kefir- softens stools -recommended to try at least one of these foods every day.  -drink 6-8 glasses of water per day -walk regularly, especially  after meals.      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

## 2023-08-10 ENCOUNTER — Encounter: Payer: No Typology Code available for payment source | Admitting: Physical Medicine and Rehabilitation

## 2023-08-12 ENCOUNTER — Other Ambulatory Visit: Payer: Self-pay | Admitting: Physical Medicine and Rehabilitation

## 2023-08-12 LAB — DRUG TOX MONITOR 1 W/CONF, ORAL FLD
Alprazolam: 0.92 ng/mL — ABNORMAL HIGH (ref <0.50)
Aminoclonazepam: NEGATIVE ng/mL (ref <0.50)
Amphetamines: NEGATIVE ng/mL (ref <10)
Barbiturates: NEGATIVE ng/mL (ref <10)
Benzodiazepines: POSITIVE ng/mL — AB (ref <0.50)
Buprenorphine: NEGATIVE ng/mL (ref <0.10)
Chlordiazepoxide: NEGATIVE ng/mL (ref <0.50)
Clonazepam: NEGATIVE ng/mL (ref <0.50)
Cocaine: NEGATIVE ng/mL (ref <5.0)
Codeine: NEGATIVE ng/mL (ref <2.5)
Diazepam: NEGATIVE ng/mL (ref <0.50)
Dihydrocodeine: NEGATIVE ng/mL (ref <2.5)
Fentanyl: NEGATIVE ng/mL (ref <0.10)
Flunitrazepam: NEGATIVE ng/mL (ref <0.50)
Flurazepam: NEGATIVE ng/mL (ref <0.50)
Heroin Metabolite: NEGATIVE ng/mL (ref <1.0)
Hydrocodone: NEGATIVE ng/mL (ref <2.5)
Hydromorphone: NEGATIVE ng/mL (ref <2.5)
Lorazepam: NEGATIVE ng/mL (ref <0.50)
MARIJUANA: NEGATIVE ng/mL (ref <2.5)
MDMA: NEGATIVE ng/mL (ref <10)
Meprobamate: NEGATIVE ng/mL (ref <2.5)
Methadone: NEGATIVE ng/mL (ref <5.0)
Midazolam: NEGATIVE ng/mL (ref <0.50)
Morphine: NEGATIVE ng/mL (ref <2.5)
Nicotine Metabolite: NEGATIVE ng/mL (ref <5.0)
Nordiazepam: NEGATIVE ng/mL (ref <0.50)
Norhydrocodone: NEGATIVE ng/mL (ref <2.5)
Opiates: POSITIVE ng/mL — AB (ref <2.5)
Oxazepam: NEGATIVE ng/mL (ref <0.50)
Oxycodone: 3.7 ng/mL — ABNORMAL HIGH (ref <2.5)
Oxymorphone: NEGATIVE ng/mL (ref <2.5)
Phencyclidine: NEGATIVE ng/mL (ref <10)
Tapentadol: NEGATIVE ng/mL (ref <5.0)
Temazepam: NEGATIVE ng/mL (ref <0.50)
Tramadol: NEGATIVE ng/mL (ref <5.0)
Triazolam: NEGATIVE ng/mL (ref <0.50)
Zolpidem: NEGATIVE ng/mL (ref <5.0)

## 2023-08-12 LAB — DRUG TOX ALC METAB W/CON, ORAL FLD: Alcohol Metabolite: NEGATIVE ng/mL (ref <25)

## 2023-08-12 MED ORDER — OXYCODONE-ACETAMINOPHEN 10-325 MG PO TABS: 1.0000 | ORAL_TABLET | Freq: Two times a day (BID) | ORAL | 0 refills | Status: DC | PRN

## 2023-08-14 ENCOUNTER — Telehealth: Payer: Self-pay | Admitting: *Deleted

## 2023-08-14 NOTE — Telephone Encounter (Signed)
 Prior auth submitted to insurance via [employer] (Key: [facility]).

## 2023-08-14 NOTE — Telephone Encounter (Signed)
 I notified Mr Mccormack and spoke with his wife about medication being sent in. She is saying Dr Mikal Plane was going to refer him to a spinal cord specialist I (in our office).  I am not sure if they are going to put in a referral or you want to speak to Dr Berline Chough about him.

## 2023-08-17 ENCOUNTER — Encounter: Payer: Self-pay | Admitting: Physical Therapy

## 2023-08-17 ENCOUNTER — Ambulatory Visit: Attending: Physician Assistant | Admitting: Physical Therapy

## 2023-08-17 DIAGNOSIS — M6281 Muscle weakness (generalized): Secondary | ICD-10-CM | POA: Diagnosis present

## 2023-08-17 DIAGNOSIS — R29898 Other symptoms and signs involving the musculoskeletal system: Secondary | ICD-10-CM | POA: Insufficient documentation

## 2023-08-17 DIAGNOSIS — R2681 Unsteadiness on feet: Secondary | ICD-10-CM | POA: Diagnosis present

## 2023-08-17 NOTE — Therapy (Signed)
 OUTPATIENT PHYSICAL THERAPY CERVICAL/THORACIC TREATMENT/PROGRESS NOTE AND RECERT      Patient Name: Raymond [name] MRN: [id number] DOB:30-Morency-1967, 58 y.o., male Today's Date: 08/17/2023  END OF SESSION:  PT End of Session - 08/17/23 0759     Visit Number 18    Number of Visits 30    Date for PT Re-Evaluation 09/28/23    Authorization Type [employer] Managed    PT Start Time 0801    PT Stop Time 0843    PT Time Calculation (min) 42 min    Activity Tolerance Patient tolerated treatment well    Behavior During Therapy [facility] for tasks assessed/performed;Flat affect               Past Medical History:  Diagnosis Date   Acute kidney injury superimposed on chronic kidney disease (HCC) 04/13/2023   Acute on chronic anemia 04/13/2023   Anemia    Anxiety    Back pain    COPD (chronic obstructive pulmonary disease) (HCC)    Depression    Diabetes mellitus without complication (HCC)    Diverticulosis    with hx of LGIB   Edema    GERD (gastroesophageal reflux disease)    GIB (gastrointestinal bleeding)    Gout    Heavy smoker    Hyperlipidemia    Hypertension    Iron deficiency anemia    Sleep apnea    uses a cpap   Swelling of lower extremity    Past Surgical History:  Procedure Laterality Date   CHONDROPLASTY Left 06/28/2014   Procedure: CHONDROPLASTY;  Surgeon: Thera Flake., MD;  Location: [facility];  Service: Orthopedics;  Laterality: Left;   COLONOSCOPY     FOOT FASCIOTOMY     age 40-rt   KNEE ARTHROSCOPY WITH LATERAL MENISECTOMY Left 06/28/2014   Procedure: KNEE ARTHROSCOPY WITH LATERAL MENISECTOMY;  Surgeon: Thera Flake., MD;  Location: [facility];  Service: Orthopedics;  Laterality: Left;   KNEE ARTHROSCOPY WITH MEDIAL MENISECTOMY Left 06/28/2014   Procedure: LEFT KNEE ARTHROSCOPY CHONDROPLASTY/WITH MEDIAL/LATERAL MENISECTOMIES;  Surgeon: Thera Flake., MD;  Location: [facility];  Service: Orthopedics;  Laterality:  Left;   ORIF RADIUS & ULNA FRACTURES  2007   left   THORACIC DISCECTOMY N/A 03/12/2023   Procedure: THORACIC LAMINECTOMY AND  DISCECTOMY;  Surgeon: Coletta Memos, MD;  Location: [facility];  Service: Neurosurgery;  Laterality: N/A;   Patient Active Problem List   Diagnosis Date Noted   Cystitis 05/21/2023   Acute diverticulitis 05/21/2023   Vitamin D deficiency 05/14/2023   Low HDL (under 40) 05/14/2023   GERD (gastroesophageal reflux disease) 04/13/2023   Constipation 04/13/2023   Chronic back pain 04/13/2023   DVT, bilateral lower limbs (HCC) 04/13/2023   Coping style affecting medical condition 03/27/2023   Thoracic myelopathy with LE weakness 03/23/2023   HNP (herniated nucleus pulposus with myelopathy), thoracic 03/12/2023   Hyperlipidemia 12/24/2022   Failure to thrive in adult 12/24/2022   Tobacco abuse 03/18/2018   BPH associated with nocturia 03/11/2017   History of substance abuse (HCC) 03/11/2017   Restless leg syndrome 03/11/2017   Restrictive lung disease 05/14/2016   OSA on CPAP 05/14/2016   Moderate COPD (chronic obstructive pulmonary disease) (HCC) 05/14/2016   Smoking greater than 40 pack years 05/14/2016   SOB (shortness of breath) 10/06/2014   Edema of extremities 10/06/2014   Benign essential HTN 10/06/2014   Type 2 diabetes mellitus, without long-term current use  of insulin (HCC) 07/18/2011    PCP: Venetia Constable, MD  REFERRING PROVIDER:  Charlton Amor, PA-C   REFERRING DIAG: [phone] (ICD-10-CM) - Other spondylosis with myelopathy, thoracic region   THERAPY DIAG:  Muscle weakness (generalized)  Unsteadiness on feet  Other symptoms and signs involving the musculoskeletal system  Rationale for Evaluation and Treatment: Rehabilitation  ONSET DATE: October 2024  SUBJECTIVE:                                                                                                                                                                                                          SUBJECTIVE STATEMENT:  I hope we're doing something fun first like standing up. I got depressed sitting in my WC all day here recently. Saw Dr. Carlis Abbott recently, I really like her, she's a good doctor. My legs are messed up, legs get big and split open then they get tiny and then I don't know what's going on with them. Have been trying to fellowship with people at church, they're 60 years old and in better shape than me. BPs have mostly been good at home, 150s/70s mostly some higher some lower.   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 He was found to have large HNP T10/T11 and underwent T10 laminectomy with discectomy by Dr. Franky Macho on 03/12/2023   PAIN:  Are you having pain? Yes: NPRS scale: 3/10 Pain location: toothache  Pain description: throbbing  Aggravating factors: nothing in PT  Relieving factors: nothing in PT     PRECAUTIONS: None  RED FLAGS: None     WEIGHT BEARING RESTRICTIONS: No  FALLS:  Has patient fallen in last 6 months? Yes. Number of falls 1 -- last fall Oct 2024  LIVING ENVIRONMENT: Lives with: lives with their spouse Lives in: House/apartment Stairs:  ramp Has following equipment at home: Environmental consultant - 2 wheeled, Wheelchair (manual), and upright walker, hospital bed  OCCUPATION: Retired  PLOF:  Able to do his own ADLs but easier to have wife assist him  PATIENT GOALS: Walk with the walker at least household distances  NEXT MD VISIT: 04/21/23 [place]  OBJECTIVE:  Note: Objective measures were completed at Evaluation unless otherwise noted.  DIAGNOSTIC FINDINGS:  03/12/23 MRI THORACIC SPINE IMPRESSION:   1. Moderate-sized lobulated left subarticular to foraminal disc protrusion at T10-11 with  resultant  severe spinal stenosis. Cord is compressed and deviated to the right at this level. Associated cord signal changes concerning for edema and/or myelomalacia. 2. No other acute abnormality within the thoracic spine. 3. Degenerative spondylosis at T8-9 through T11-12 without significant spinal stenosis. Associated moderate to severe bilateral foraminal narrowing at these levels as above.  MRI LUMBAR SPINE IMPRESSION:   1. No acute abnormality within the lumbar spine. 2. Multifactorial degenerative changes at L4-5 with resultant mild canal with severe left and moderate right lateral recess stenosis, with severe bilateral L4 foraminal narrowing. 3. Additional mild noncompressive disc bulging and facet hypertrophy elsewhere within the lumbar spine as above. No other significant stenosis or frank neural impingement.   Critical Value/emergent results were called by telephone at the time of interpretation on 03/12/2023 at 12:41 am to provider Dr. Madilyn Hook, who verbally acknowledged these results.  PATIENT SURVEYS:  FOTO 42; predicted 54; 06/15/23- 27; 07/15/23- 37   COGNITION: Overall cognitive status: Within functional limits for tasks assessed  SENSATION: WFL  POSTURE: rounded shoulders and forward head  PALPATION: No overt tenderness to palpation   CERVICAL ROM: WNL  LOWER EXTREMITY MMT:    MMT Right eval Left eval Right 07/15/23 Left 07/15/23 Right 08/17/23 Left  08/17/23  Hip flexion 3+ 3 4+ 3 4 4+  Hip extension 3+ 3+      Hip abduction 3+ 3+ 4 4 4 4   Hip adduction        Hip internal rotation        Hip external rotation        Knee flexion 4- 3+   3+ 3+  Knee extension 4- 3+ 4+ 4+ 3+ 4+  Ankle dorsiflexion   4+ 4+ 4+ 4  Ankle plantarflexion        Ankle inversion        Ankle eversion         (Blank rows = not tested)   FUNCTIONAL TESTS:  Transfers:  Chair to bed: Multiple seated scoots with UEs  Sit<>stand: max A -- limited due to knee pain Supine  to sit<>sit: min A for trunk and LE negotiation. Not performing log rolling  TODAY'S TREATMENT:                                                                                                                              DATE:    08/17/23   BP 139/79 HR 69 MMT check and goal review for re-recert, education on POC moving forward with focus getting back into water   LAQs 3# 2x12 B Seated alternating marches 3# each LE 2x20  Seated chest presses 3# bar 1x10  Bicep curls 3# x20  Green TB seated clams x20  Green TB seated LE press x10  B      07/15/23  BP 182/97 on upper arm but cuff barely fit, on wrist error message on 175/109   FOTO, limited MMT due to elevated blood pressures  Lots of education today given danger of high BP especially in the context of exercise, educated on progress with PT but that water PT is mostly likely to lead to meaningful gains in the big picture, educated on safe BP parameters and stressed importance of return to MD for medical management prior to starting water PT, safety limitations of session today. Educated to not try standing on his own at home due to high fall risk.        PATIENT EDUCATION:  Education details: aquatic rationale Person educated: Patient Education method: Programmer, multimedia, Facilities manager, and Handouts Education comprehension: verbalized understanding, returned demonstration, and needs further education  HOME EXERCISE PROGRAM: Access Code: [email or web address] URL: [email or web address] Date: 04/21/2023 Prepared by: Vernon Prey April Kirstie Peri  Exercises - Staggered Bridge  - 1 x daily - 7 x weekly - 2 sets - 10 reps - Hooklying Isometric Clamshell  - 1 x daily - 7 x weekly - 2 sets - 10 reps - Small Range Straight Leg Raise  - 1 x daily - 7 x weekly - 2 sets - 10 reps  ASSESSMENT:  CLINICAL IMPRESSION:  Pt arrives today after some time out of PT due to medical issues with fluid and BPs- fluid is still up and down per his  report and LEs were swollen today, but blood pressure was MUCH improved. Continue to feel that he will get max benefit from getting back into water PT especially with known limitations from B knee joint pain and baseline limited mobility on land. Noted increased tone and weakness R LE today, doesn't sound like he's been doing a lot at home exercise/activity wise. Direct messaged water physical therapy neuro staff today to work to arrange this, extended cert period again today to give [place] more flexibility with getting this schedule built.     OBJECTIVE IMPAIRMENTS: Abnormal gait, decreased balance, decreased endurance, decreased mobility, difficulty walking, decreased ROM, decreased strength, increased edema, improper body mechanics, postural dysfunction, obesity, and pain.   GOALS: Goals reviewed with patient? Yes  SHORT TERM GOALS: Target date: 09/07/2023       Pt will be ind with initial HEP Baseline:  Goal status: 05/06/23- provided, but inconsistent in performing. Ongoing 07/15/23  2.  Pt will be able to tolerate standing x 1 min in the parallel bars with BUE support and CGA Baseline: 06/01/23- Goal updated after recent hospitalization Goal status: 07/15/23 unable to assess due to elevated BP  3.  Pt will be able to perform sliding board transfer with S, including set up, and up a slight (3") grade Baseline: 06/01/23- Goal updated after recent hospitalization Goal status:  07/15/23 PARTIALLY MET    LONG TERM GOALS: Target date: 09/28/2023       Pt will be ind with management and progression of HEP Baseline:  Goal status: ONGOING 07/15/23  2.  Pt will be able to perform stand-pivot transfers with RW and no more than MinA  Baseline:  Goal status: GOAL MODIFIED  07/15/23  3.  Pt will be able to perform at least 2 reps of sit to stand in 60 sec with UE support Baseline: Unable to perform complete STS without mod A Goal status: GOAL MODIFIED 07/15/23  4.  Pt will have increased  FOTO to >/=54 Baseline:  Goal status: ONGOING 07/15/23  PLAN:  PT FREQUENCY: 1-2x/week  PT DURATION: 6 weeks  PLANNED INTERVENTIONS: 97164- PT Re-evaluation, 97110-Therapeutic exercises, 97530- Therapeutic activity, 97112- Neuromuscular re-education, 97535- Self Care, 45409- Manual therapy, [id number]- Gait training, [phone]-  Orthotic Fit/training, 09811- Aquatic Therapy, [id number]- Electrical stimulation (unattended), [id number]- Ultrasound, [id number]- Ionotophoresis 4mg /ml Dexamethasone, Patient/Family education, Balance training, Stair training, Taping, Dry Needling, Joint mobilization, Spinal mobilization, Cryotherapy, and Moist heat  PLAN FOR NEXT SESSION: focus on water PT for maximal functional gains; closely monitor BP and overall medical status, education PRN; get back in the water if water staff feels he is medically appropriate   Nedra Hai, PT, DPT 08/17/23 8:45 AM

## 2023-08-18 ENCOUNTER — Ambulatory Visit (INDEPENDENT_AMBULATORY_CARE_PROVIDER_SITE_OTHER): Payer: No Typology Code available for payment source | Admitting: Bariatrics

## 2023-08-18 ENCOUNTER — Encounter: Payer: Self-pay | Admitting: Bariatrics

## 2023-08-18 VITALS — BP 135/79 | HR 68 | Temp 98.0°F | Ht 71.0 in | Wt 334.0 lb

## 2023-08-18 DIAGNOSIS — E785 Hyperlipidemia, unspecified: Secondary | ICD-10-CM | POA: Diagnosis not present

## 2023-08-18 DIAGNOSIS — E119 Type 2 diabetes mellitus without complications: Secondary | ICD-10-CM | POA: Diagnosis not present

## 2023-08-18 DIAGNOSIS — Z6841 Body Mass Index (BMI) 40.0 and over, adult: Secondary | ICD-10-CM

## 2023-08-18 DIAGNOSIS — N1832 Chronic kidney disease, stage 3b: Secondary | ICD-10-CM

## 2023-08-18 DIAGNOSIS — E7849 Other hyperlipidemia: Secondary | ICD-10-CM

## 2023-08-18 DIAGNOSIS — Z7985 Long-term (current) use of injectable non-insulin antidiabetic drugs: Secondary | ICD-10-CM

## 2023-08-18 MED ORDER — VITAMIN D (ERGOCALCIFEROL) 1.25 MG (50000 UNIT) PO CAPS: 50000.0000 [IU] | ORAL_CAPSULE | ORAL | 0 refills | Status: DC

## 2023-08-18 NOTE — Progress Notes (Signed)
 WEIGHT SUMMARY AND BIOMETRICS  Weight Lost Since Last Visit: 2lb  Weight Gained Since Last Visit: 0lb   Vitals Temp: 98 F (36.7 C) BP: 135/79 Pulse Rate: 68 SpO2: 97 %   Anthropometric Measurements Height: 5\' 11"  (1.803 m) Weight: (!) 334 lb (151.5 kg) BMI (Calculated): 46.6 Weight at Last Visit: 336lb Weight Lost Since Last Visit: 2lb Weight Gained Since Last Visit: 0lb Starting Weight: 326lb Total Weight Loss (lbs): 0 lb (0 kg)   No data recorded Other Clinical Data Fasting: No Labs: No Today's Visit #: 6 Starting Date: 05/13/23    OBESITY Raymond [name] is here to discuss his progress with his obesity treatment plan along with follow-up of his obesity related diagnoses.    Nutrition Plan: the Category 4 plan - 70% adherence.  Current exercise:  some house work  Interim History:  He is down 2 lbs since his last visit.  Eating all of the food on the plan., Protein intake is as prescribed, Water intake is adequate., Denies polyphagia, and Denies excessive cravings.   Pharmacotherapy: Raymond [name] is on Mounjaro 12.5 mg SQ weekly Adverse side effects: None Hunger is moderately controlled.  Cravings are moderately controlled.  Assessment/Plan:   Type II Diabetes HgbA1c is at goal. Last A1c was 6.0 CBGs: Not checking      Episodes of hypoglycemia: yes - occasional Medication(s): Mounjaro 12.5 mg SQ weekly  Lab Results  Component Value Date   HGBA1C 6.0 (H) 03/12/2023   HGBA1C 6.3 (H) 12/25/2022   Lab Results  Component Value Date   LDLCALC 77 05/13/2023   CREATININE 1.87 (H) 05/25/2023   Lab Results  Component Value Date   GFR 92.19 10/05/2014    Plan: Continue Mounjaro 12.5 mg SQ weekly Continue all other medications.  Will keep all carbohydrates low both sweets and starches.  Will continue exercise regimen to 30 to 60 minutes on most days  of the week.  Aim for 7 to 9 hours of sleep nightly.  Eat more low glycemic index foods.  Will drink around 64 ounces of fluid (not too much or too little) secondary to CHF.  He will elevate his legs.  He will do his water therapy on a regular basis.   Hyperlipidemia LDL is not at goal. Medication(s): Lipitor Cardiovascular risk factors: advanced age (older than 52 for men, 93 for women), diabetes mellitus, dyslipidemia, hypertension, male gender, obesity (BMI >= 30 kg/m2), and sedentary lifestyle  Lab Results  Component Value Date   CHOL 139 05/13/2023   HDL 36 (L) 05/13/2023   LDLCALC 77 05/13/2023   TRIG 145 05/13/2023   Lab Results  Component Value Date   ALT 18 05/25/2023   AST 19 05/25/2023   ALKPHOS 78 05/25/2023   BILITOT 0.4 05/25/2023   The 10-year ASCVD risk score (Arnett DK, et al., 2019) is: 26.8%   Values used  to calculate the score:     Age: 58 years     Sex: Male     Is Non-Hispanic African American: No     Diabetic: Yes     Tobacco smoker: Yes     Systolic Blood Pressure: 135 mmHg     Is BP treated: Yes     HDL Cholesterol: 36 mg/dL     Total Cholesterol: 139 mg/dL  Plan:  Continue statin.  Will follow the plan 85 to 95 %.  Will avoid all trans fats.  Will read labels Will minimize saturated fats except the following: low fat meats in moderation, diary, and limited dark chocolate.  Increase Omega 3 in foods, and consider an Omega 3 supplement.    Vitamin D Deficiency Vitamin D is at goal of 50.  Most recent vitamin D level was 18.8. He is on  prescription ergocalciferol 50,000 IU weekly. Lab Results  Component Value Date   VD25OH 18.8 (L) 05/13/2023   VD25OH 13.90 (L) 12/29/2022    Plan: Refill prescription vitamin D 50,000 IU weekly.      Morbid Obesity: Current BMI BMI (Calculated): 46.6   Pharmacotherapy Plan Continue  Mounjaro 12.5 mg SQ weekly  Raymond [name] is currently in the action stage of change. As such, his goal is to continue with  weight loss efforts.  He has agreed to the Category 4 plan.  Exercise goals: All adults should avoid inactivity. Some physical activity is better than none, and adults who participate in any amount of physical activity gain some health benefits.  Behavioral modification strategies: increasing lean protein intake, decreasing simple carbohydrates , no meal skipping, meal planning , planning for success, increasing fiber rich foods, decrease snacking , avoiding temptations, keep healthy foods in the home, weigh protein portions, and mindful eating.  Raymond [name] has agreed to follow-up with our clinic in 4 weeks.      Objective:   VITALS: Per patient if applicable, see vitals. GENERAL: Alert and in no acute distress. CARDIOPULMONARY: No increased WOB. Speaking in clear sentences.  PSYCH: Pleasant and cooperative. Speech normal rate and rhythm. Affect is appropriate. Insight and judgement are appropriate. Attention is focused, linear, and appropriate.  NEURO: Oriented as arrived to appointment on time with no prompting.   Attestation Statements:    This was prepared with the assistance of Engineer, civil (consulting).  Occasional wrong-word or sound-a-like substitutions [place] have occurred due to the inherent limitations of voice recognition   Corinna Capra, DO

## 2023-08-25 ENCOUNTER — Other Ambulatory Visit: Payer: Self-pay | Admitting: Physical Medicine and Rehabilitation

## 2023-08-25 DIAGNOSIS — M4714 Other spondylosis with myelopathy, thoracic region: Secondary | ICD-10-CM

## 2023-08-28 ENCOUNTER — Encounter: Admitting: Physical Medicine and Rehabilitation

## 2023-08-28 VITALS — BP 126/75 | HR 72 | Ht 71.0 in | Wt 325.0 lb

## 2023-08-28 DIAGNOSIS — E66811 Obesity, class 1: Secondary | ICD-10-CM

## 2023-08-28 DIAGNOSIS — G8918 Other acute postprocedural pain: Secondary | ICD-10-CM

## 2023-08-28 DIAGNOSIS — Z79891 Long term (current) use of opiate analgesic: Secondary | ICD-10-CM | POA: Diagnosis not present

## 2023-08-28 DIAGNOSIS — I509 Heart failure, unspecified: Secondary | ICD-10-CM

## 2023-08-28 DIAGNOSIS — M4714 Other spondylosis with myelopathy, thoracic region: Secondary | ICD-10-CM | POA: Diagnosis not present

## 2023-08-28 DIAGNOSIS — G894 Chronic pain syndrome: Secondary | ICD-10-CM | POA: Diagnosis not present

## 2023-08-28 DIAGNOSIS — Z6830 Body mass index (BMI) 30.0-30.9, adult: Secondary | ICD-10-CM

## 2023-08-28 DIAGNOSIS — I10 Essential (primary) hypertension: Secondary | ICD-10-CM

## 2023-08-28 MED ORDER — OXYCODONE-ACETAMINOPHEN 10-325 MG PO TABS: 1.0000 | ORAL_TABLET | Freq: Four times a day (QID) | ORAL | 0 refills | Status: DC | PRN

## 2023-08-28 MED ORDER — TIZANIDINE HCL 2 MG PO TABS: 2.0000 mg | ORAL_TABLET | Freq: Every day | ORAL | 0 refills | Status: DC

## 2023-08-28 NOTE — Progress Notes (Addendum)
 Subjective:     Patient ID: Raymond [name], male   DOB: Dec 29, 1965, 58 y.o.   MRN: [id number]  HPI   Raymond [name] is a 58 year old man who presents for f/u up of thoracic myelopathy.  1) Thoracic myelopathy: -improving slowly but surely -transferring independently  -spends a lot of time in the recliner  2) HTN:  -hydrochlorothiazide added- he got one last night and this morning  3) Left flank nodule:  -painful at times.   4) Postoperative pain: -received Oxycodone 10mg - he takes 3 or 4 per day -he has not had much opportunity to use this prior to therapy -pain has been severe -spasticity is part of the pain -he is almost out of oxycodone -he does not have pain contract or urine sample performed with [place] -patient states that his neurosurgeon recommended increasing his pain medication -the baclofen does help -takes tizanidine 2mg  at night- needs refill  Pain Inventory Average Pain 7 Pain Right Now 8 My pain is intermittent, sharp, burning, dull, stabbing, tingling, and aching  In the last 24 hours, has pain interfered with the following? General activity 7 Relation with others 0 Enjoyment of life 0 What TIME of day is your pain at its worst? morning , daytime, evening, night, and varies Sleep (in general) Fair  Pain is worse with: sitting and some activites Pain improves with: rest, heat/ice, therapy/exercise, and medication Relief from Meds: 3  ability to climb steps?  no do you drive?  no use a wheelchair transfers alone Do you have any goals in this area?  yes  employed # of hrs/week medical leave I need assistance with the following:  dressing, bathing, meal prep, household duties, and shopping Do you have any goals in this area?  yes  weakness numbness tremor tingling trouble walking spasms depression anxiety  Any changes since last visit?  no  Any changes since last visit?  no    Family History  Problem Relation Age of Onset   Diabetes Mother     Hypertension Mother    High Cholesterol Mother    Heart disease Mother    Depression Mother    Diabetes Father    Hypertension Father    High Cholesterol Father    Heart disease Father    Cancer Other    Social History   Socioeconomic History   Marital status: Single    Spouse name: Not on file   Number of children: Not on file   Years of education: Not on file   Highest education level: Not on file  Occupational History   Not on file  Tobacco Use   Smoking status: Every Day    Current packs/day: 2.00    Types: Cigarettes   Smokeless tobacco: Never  Vaping Use   Vaping status: Former  Substance and Sexual Activity   Alcohol use: Yes    Comment: rare   Drug use: No   Sexual activity: Not on file  Other Topics Concern   Not on file  Social History Narrative   Not on file   Social Drivers of Health   Financial Resource Strain: Not on file  Food Insecurity: No Food Insecurity (05/21/2023)   Hunger Vital Sign    Worried About Running Out of Food in the Last Year: Never true    Ran Out of Food in the Last Year: Never true  Transportation Needs: No Transportation Needs (05/21/2023)   PRAPARE - Administrator, Civil Service (Medical):  No    Lack of Transportation (Non-Medical): No  Physical Activity: Not on file  Stress: No Stress Concern Present (03/21/2022)   Received from [facility], [facility] - Occupational Stress Questionnaire    Feeling of Stress : Not at all  Social Connections: Unknown (01/10/2022)   Received from [facility], [facility]   Social Network    Social Network: Not on file   Past Surgical History:  Procedure Laterality Date   CHONDROPLASTY Left 06/28/2014   Procedure: CHONDROPLASTY;  Surgeon: Thera Flake., MD;  Location: [facility];  Service: Orthopedics;  Laterality: Left;   COLONOSCOPY     FOOT FASCIOTOMY     age 77-rt   KNEE ARTHROSCOPY WITH LATERAL  MENISECTOMY Left 06/28/2014   Procedure: KNEE ARTHROSCOPY WITH LATERAL MENISECTOMY;  Surgeon: Thera Flake., MD;  Location: [facility];  Service: Orthopedics;  Laterality: Left;   KNEE ARTHROSCOPY WITH MEDIAL MENISECTOMY Left 06/28/2014   Procedure: LEFT KNEE ARTHROSCOPY CHONDROPLASTY/WITH MEDIAL/LATERAL MENISECTOMIES;  Surgeon: Thera Flake., MD;  Location: [facility];  Service: Orthopedics;  Laterality: Left;   ORIF RADIUS & ULNA FRACTURES  2007   left   THORACIC DISCECTOMY N/A 03/12/2023   Procedure: THORACIC LAMINECTOMY AND  DISCECTOMY;  Surgeon: Coletta Memos, MD;  Location: [facility] OR;  Service: Neurosurgery;  Laterality: N/A;   Past Medical History:  Diagnosis Date   Acute kidney injury superimposed on chronic kidney disease (HCC) 04/13/2023   Acute on chronic anemia 04/13/2023   Anemia    Anxiety    Back pain    COPD (chronic obstructive pulmonary disease) (HCC)    Depression    Diabetes mellitus without complication (HCC)    Diverticulosis    with hx of LGIB   Edema    GERD (gastroesophageal reflux disease)    GIB (gastrointestinal bleeding)    Gout    Heavy smoker    Hyperlipidemia    Hypertension    Iron deficiency anemia    Sleep apnea    uses a cpap   Swelling of lower extremity    BP 126/75   Pulse 72   Ht 5\' 11"  (1.803 m)   Wt (!) 325 lb (147.4 kg)   SpO2 98%   BMI 45.33 kg/m   Opioid Risk Score:   Fall Risk Score:  `1  Depression screen PHQ 2/9      No data to display          Review of Systems  Musculoskeletal:  Positive for gait problem. Negative for back pain.       Spasms Pain in both hips into buttocks down both legs  Neurological:  Positive for tremors, weakness and numbness.       Tingling  Psychiatric/Behavioral:         Depression, anxiety  All other systems reviewed and are negative.      Objective:   Physical Exam Gen: no distress, normal appearing HEENT: oral mucosa pink and moist, NCAT Cardio:  Reg rate Chest: normal effort, normal rate of breathing Abd: soft, non-distended Ext: no edema Psych: pleasant, normal affect Skin: intact Neuro: Alert and oriented x3, wheelchair bound, decreased sensation in lower extremiteis, 4.5      Assessment:         Plan:   1) Thoracic myelopathy: -continue therapy, prescribed aquatherapy -discussed his postoperative pain -discussed that we need pain contact and urine sample before  we can prescribe opioids for him, increased baclofen to 10mg  QID  2) Obesity: -continue Monjaro -Educated regarding health benefits of weight loss- for pain, general health, chronic disease prevention, immune health, mental health.  -Will monitor weight every visit.  -Consider Ro[place]ois tea daily.  -Discussed the benefits of intermittent fasting. -Discussed foods that can assist in weight loss: 1) leafy greens- high in fiber and nutrients 2) dark chocolate- improves metabolism (if prefer sweetened, best to sweeten with honey instead of sugar).  3) cruciferous vegetables- high in fiber and protein 4) full fat yogurt: high in healthy fat, protein, calcium, and probiotics 5) apples- high in a variety of phytochemicals 6) nuts- high in fiber and protein that increase feelings of fullness 7) grapefruit: rich in nutrients, antioxidants, and fiber (not to be taken with anticoagulation) 8) beans- high in protein and fiber 9) salmon- has high quality protein and healthy fats 10) green tea- rich in polyphenols 11) eggs- rich in choline and vitamin D 12) tuna- high protein, boosts metabolism 13) avocado- decreases visceral abdominal fat 14) chicken (pasture raised): high in protein and iron 15) blueberries- reduce abdominal fat and cholesterol 16) whole grains- decreases calories retained during digestion, speeds metabolism 17) chia seeds- curb appetite 18) chilies- increases fat metabolism  -Discussed supplements that can be used:  1) Metatrim 400mg  BID 30 minutes  before breakfast and dinner  2) Sphaeranthus indicus and Garcinia mangostana (combinations of these and #1 can be found in capsicum and zychrome  3) green coffee bean extract 400mg  twice per day or Irvingia (african mango) 150 to 300mg  twice per day.   3) HTN: Continue tizanidine 2mg  HS -continue hydrochlorothiazide -continue lasix -resume therapy because BP is better controlled -Advised checking BP daily at home and logging results to bring into follow-up appointment with PCP and myself. -Reviewed BP meds today.  -Advised regarding healthy foods that can help lower blood pressure and provided with a list: 1) citrus foods- high in vitamins and minerals 2) salmon and other fatty fish - reduces inflammation and oxylipins 3) swiss chard (leafy green)- high level of nitrates 4) pumpkin seeds- one of the best natural sources of magnesium 5) Beans and lentils- high in fiber, magnesium, and potassium 6) Berries- high in flavonoids 7) Amaranth (whole grain, can be cooked similarly to rice and oats)- high in magnesium and fiber 8) Pistachios- even more effective at reducing BP than other nuts 9) Carrots- high in phenolic compounds that relax blood vessels and reduce inflammation 10) Celery- contain phthalides that relax tissues of arterial walls 11) Tomatoes- can also improve cholesterol and reduce risk of heart disease 12) Broccoli- good source of magnesium, calcium, and potassium 13) Greek yogurt: high in potassium and calcium 14) Herbs and spices: Celery seed, cilantro, saffron, lemongrass, black cumin, ginseng, cinnamon, cardamom, sweet basil, and ginger 15) Chia and flax seeds- also help to lower cholesterol and blood sugar 16) Beets- high levels of nitrates that relax blood vessels  17) spinach and bananas- high in potassium  -Provided lise of supplements that can help with hypertension:  1) magnesium: one high quality brand is Bioptemizers since it contains all 7 types of magnesium,  otherwise over the counter magnesium gluconate 400mg  is a good option 2) B vitamins 3) vitamin D 4) potassium 5) CoQ10 6) L-arginine 7) Vitamin C 8) Beetroot -Educated that goal BP is 120/80. -Made goal to incorporate some of the above foods into diet.    4) Diabetes: -check CBGs daily, log, and bring log  to follow-up appointment -avoid sugar, bread, pasta, rice -avoid snacking -perform daily foot exam and at least annual eye exam -try to incorporate into your diet some of the following foods which are good for diabetes: 1) cinnamon- imitates effects of insulin, increasing glucose transport into cells ([place] or [place] cinnamon is best, least processed) 2) nuts- can slow down the blood sugar response of carbohydrate rich foods 3) oatmeal- contains and anti-inflammatory compound avenanthramide 4) whole-milk yogurt (best types are no sugar, [place] yogurt, or goat/sheep yogurt) 5) beans- high in protein, fiber, and vitamins, low glycemic index 6) broccoli- great source of vitamin A and C 7) quinoa- higher in protein and fiber than other grains 8) spinach- high in vitamin A, fiber, and protein 9) olive oil- reduces glucose levels, LDL, and triglycerides 10) salmon- excellent amount of omega-3-fatty acids 11) walnuts- rich in antioxidants 12) apples- high in fiber and quercetin 13) carrots- highly nutritious with low impact on blood sugar 14) eggs- improve HDL (good cholesterol), high in protein, keep you satiated 15) turmeric: improves blood sugars, cardiovascular disease, and protects kidney health 16) garlic: improves blood sugar, blood pressure, pain 17) tomatoes: highly nutritious with low impact on blood sugar   5) Chronic Pain: -discussed getting pain contract and urine sample today, if contains expected metabolities, will oxycodone 10mg  QID prn -Discussed current symptoms of pain and history of pain.  -Discussed benefits of exercise in reducing pain. -Discussed following  foods that Sir[place] reduce pain: 1) Ginger (especially studied for arthritis)- reduce leukotriene production to decrease inflammation 2) Blueberries- high in phytonutrients that decrease inflammation 3) Salmon- marine omega-3s reduce joint swelling and pain 4) Pumpkin seeds- reduce inflammation 5) dark chocolate- reduces inflammation 6) turmeric- reduces inflammation 7) tart cherries - reduce pain and stiffness 8) extra virgin olive oil - its compound olecanthal helps to block prostaglandins  9) chili peppers- can be eaten or applied topically via capsaicin 10) mint- helpful for headache, muscle aches, joint pain, and itching 11) garlic- reduces inflammation  Link to further information on diet for chronic pain: [email or web address]   6) Constipation:  -Provided list of following foods that help with constipation and highlighted a few: 1) prunes- contain high amounts of fiber.  2) apples- has a form of dietary fiber called pectin that accelerates stool movement and increases beneficial gut bacteria 3) pears- in addition to fiber, also high in fructose and sorbitol which have laxative effect 4) figs- contain an enzyme ficin which helps to speed colonic transit 5) kiwis- contain an enzyme actinidin that improves gut motility and reduces constipation 6) oranges- rich in pectin (like apples) 7) grapefruits- contain a flavanol naringenin which has a laxative effect 8) vegetables- rich in fiber and also great sources of folate, vitamin C, and K 9) artichoke- high in inulin, prebiotic great for the microbiome 10) chicory- increases stool frequency and softness (can be added to coffee) 11) rhubarb- laxative effect 12) sweet potato- high fiber 13) beans, peas, and lentils- contain both soluble and insoluble fiber 14) chia seeds- improves intestinal health and gut flora 15) flaxseeds- laxative effect 16) whole grain rye bread-  high in fiber 17) oat bran- high in soluble and insoluble fiber 18) kefir- softens stools -recommended to try at least one of these foods every day.  -drink 6-8 glasses of water per day -walk regularly, especially after meals.      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

## 2023-09-07 ENCOUNTER — Other Ambulatory Visit: Payer: Self-pay | Admitting: Bariatrics

## 2023-09-14 ENCOUNTER — Ambulatory Visit: Payer: Self-pay | Attending: Physician Assistant | Admitting: Physical Therapy

## 2023-09-14 DIAGNOSIS — M25562 Pain in left knee: Secondary | ICD-10-CM | POA: Diagnosis present

## 2023-09-14 DIAGNOSIS — R2681 Unsteadiness on feet: Secondary | ICD-10-CM | POA: Diagnosis present

## 2023-09-14 DIAGNOSIS — G8929 Other chronic pain: Secondary | ICD-10-CM | POA: Diagnosis present

## 2023-09-14 DIAGNOSIS — M6281 Muscle weakness (generalized): Secondary | ICD-10-CM | POA: Diagnosis present

## 2023-09-14 DIAGNOSIS — R2689 Other abnormalities of gait and mobility: Secondary | ICD-10-CM | POA: Diagnosis present

## 2023-09-14 DIAGNOSIS — M25561 Pain in right knee: Secondary | ICD-10-CM | POA: Insufficient documentation

## 2023-09-15 ENCOUNTER — Encounter: Payer: Self-pay | Admitting: Physical Therapy

## 2023-09-15 ENCOUNTER — Ambulatory Visit (INDEPENDENT_AMBULATORY_CARE_PROVIDER_SITE_OTHER): Payer: Self-pay | Admitting: Bariatrics

## 2023-09-15 ENCOUNTER — Encounter: Payer: Self-pay | Admitting: Bariatrics

## 2023-09-15 VITALS — BP 131/79 | HR 64 | Temp 98.1°F | Ht 71.0 in | Wt 326.0 lb

## 2023-09-15 DIAGNOSIS — E119 Type 2 diabetes mellitus without complications: Secondary | ICD-10-CM | POA: Diagnosis not present

## 2023-09-15 DIAGNOSIS — I10 Essential (primary) hypertension: Secondary | ICD-10-CM

## 2023-09-15 DIAGNOSIS — Z7985 Long-term (current) use of injectable non-insulin antidiabetic drugs: Secondary | ICD-10-CM

## 2023-09-15 DIAGNOSIS — Z6841 Body Mass Index (BMI) 40.0 and over, adult: Secondary | ICD-10-CM

## 2023-09-15 DIAGNOSIS — N1832 Chronic kidney disease, stage 3b: Secondary | ICD-10-CM

## 2023-09-15 DIAGNOSIS — E66813 Obesity, class 3: Secondary | ICD-10-CM

## 2023-09-15 MED ORDER — ONDANSETRON HCL 4 MG PO TABS: 4.0000 mg | ORAL_TABLET | Freq: Three times a day (TID) | ORAL | 0 refills | Status: DC | PRN

## 2023-09-15 MED ORDER — VITAMIN D (ERGOCALCIFEROL) 1.25 MG (50000 UNIT) PO CAPS: 50000.0000 [IU] | ORAL_CAPSULE | ORAL | 0 refills | Status: DC

## 2023-09-15 MED ORDER — VALSARTAN-HYDROCHLOROTHIAZIDE 80-12.5 MG PO TABS
1.0000 | ORAL_TABLET | Freq: Every day | ORAL | 3 refills | Status: DC
Start: 2023-09-15 — End: 2023-12-16

## 2023-09-15 NOTE — Progress Notes (Signed)
 WEIGHT SUMMARY AND BIOMETRICS  Weight Lost Since Last Visit: 8lb  Weight Gained Since Last Visit: 0   Vitals Temp: 98.1 F (36.7 C) BP: 131/79 Pulse Rate: 64 SpO2: 96 %   Anthropometric Measurements Height: 5\' 11"  (1.803 m) Weight: (!) 326 lb (147.9 kg) BMI (Calculated): 45.49 Weight at Last Visit: 334lb Weight Lost Since Last Visit: 8lb Weight Gained Since Last Visit: 0 Starting Weight: 326lb Total Weight Loss (lbs): 0 lb (0 kg)   No data recorded Other Clinical Data Fasting: no Labs: no Today's Visit #: 7 Starting Date: 05/13/23    OBESITY Raymond [name] is here to discuss his progress with his obesity treatment plan along with follow-up of his obesity related diagnoses.    Nutrition Plan: the Category 4 plan - 80% adherence.  Current exercise:  Chair exercises and physical therapy   Interim History:  He is down 8 lbs since his last visit.  He is back into water aerobics  Eating all of the food on the plan., Protein intake is as prescribed, Is not skipping meals, Not journaling consistently., and Water intake is adequate.   Pharmacotherapy: Raymond [name] is on Mounjaro 12.5 mg SQ weekly Adverse side effects: None Hunger is moderately controlled.  Cravings are moderately controlled.  Assessment/Plan:   Type II Diabetes HgbA1c is at goal. Last A1c was 6.0 Episodes of hypoglycemia: no Medication(s): Mounjaro 12.5 mg SQ weekly We increased his dose of Mounjaro at his last visit from 10.0 mg to 12.5 mg weekly.  He has tolerated the dose increase well except for some mild nausea.  Lab Results  Component Value Date   HGBA1C 6.0 (H) 03/12/2023   HGBA1C 6.3 (H) 12/25/2022   Lab Results  Component Value Date   LDLCALC 77 05/13/2023   CREATININE 1.87 (H) 05/25/2023   Lab Results  Component Value Date   GFR 92.19 10/05/2014    Plan: Continue Mounjaro  12.5 mg SQ weekly Rx: Zofran 4 mg every 8 hours as needed for nausea #30 with no refills Will keep all carbohydrates low both sweets and starches.  Will continue exercise regimen to 30 to 60 minutes on most days of the week.  Aim for 7 to 9 hours of sleep nightly.  Eat more low glycemic index foods.  Will continue to cook at home. Will continue to monitor his intake   Hypertension Hypertension stable.  Medication(s): See medication list.   BP Readings from Last 3 Encounters:  09/15/23 131/79  08/28/23 126/75  08/18/23 135/79   Lab Results  Component Value Date   CREATININE 1.87 (H) 05/25/2023   CREATININE 1.85 (H) 05/24/2023   CREATININE 1.59 (H) 05/23/2023   Lab Results  Component Value Date   GFR 92.19 10/05/2014    Plan: Continue all antihypertensives at current dosages. No added salt. Will keep sodium content to 1,500 mg or less per day.  Rx:   Diovan HCT 80-12.5 daily 1 tablet daily    Morbid Obesity: Current BMI BMI (Calculated): 45.49   Pharmacotherapy Plan Continue  Mounjaro 12.5 mg SQ weekly  [name] is currently in the action stage of change. As such, his goal is to continue with weight loss efforts.  He has agreed to the Category 4 plan.  Exercise goals: Older adults should determine their level of effort for physical activity relative to their level of fitness.   Behavioral modification strategies: increasing lean protein intake.  He is eating healthier fruits, and is more active. He will continue his water aerobics class.  [name] has agreed to follow-up with our clinic in 4 weeks.       Objective:   VITALS: Per patient if applicable, see vitals. GENERAL: Alert and in no acute distress. CARDIOPULMONARY: No increased WOB. Speaking in clear sentences.  PSYCH: Pleasant and cooperative. Speech normal rate and rhythm. Affect is appropriate. Insight and judgement are appropriate. Attention is focused, linear, and appropriate.  NEURO: Oriented as arrived to  appointment on time with no prompting.   Attestation Statements:   This was prepared with the assistance of Engineer, civil (consulting).  Occasional wrong-word or sound-a-like substitutions Raymond [name] have occurred due to the inherent limitations of voice recognition   Raymond Peper, DO

## 2023-09-15 NOTE — Therapy (Signed)
 OUTPATIENT PHYSICAL THERAPY THORACIC/AQUATIC THERAPY NOTE     Patient Name: Raymond [name] MRN: [id number] DOB:1965/12/16, 58 y.o., male Today's Date: 09/15/2023  END OF SESSION:  PT End of Session - 09/15/23 1919     Visit Number 19    Number of Visits 30    Date for PT Re-Evaluation 09/28/23    Authorization Type [employer] Managed    PT Start Time 1400    PT Stop Time 1445    PT Time Calculation (min) 45 min    Equipment Utilized During Treatment Other (comment)   water walker, noodle   Activity Tolerance Patient tolerated treatment well    Behavior During Therapy WFL for tasks assessed/performed               Past Medical History:  Diagnosis Date   Acute kidney injury superimposed on chronic kidney disease (HCC) 04/13/2023   Acute on chronic anemia 04/13/2023   Anemia    Anxiety    Back pain    COPD (chronic obstructive pulmonary disease) (HCC)    Depression    Diabetes mellitus without complication (HCC)    Diverticulosis    with hx of LGIB   Edema    GERD (gastroesophageal reflux disease)    GIB (gastrointestinal bleeding)    Gout    Heavy smoker    Hyperlipidemia    Hypertension    Iron deficiency anemia    Sleep apnea    uses a cpap   Swelling of lower extremity    Past Surgical History:  Procedure Laterality Date   CHONDROPLASTY Left 06/28/2014   Procedure: CHONDROPLASTY;  Surgeon: Thera Flake., MD;  Location: [facility];  Service: Orthopedics;  Laterality: Left;   COLONOSCOPY     FOOT FASCIOTOMY     age 40-rt   KNEE ARTHROSCOPY WITH LATERAL MENISECTOMY Left 06/28/2014   Procedure: KNEE ARTHROSCOPY WITH LATERAL MENISECTOMY;  Surgeon: Thera Flake., MD;  Location: [facility];  Service: Orthopedics;  Laterality: Left;   KNEE ARTHROSCOPY WITH MEDIAL MENISECTOMY Left 06/28/2014   Procedure: LEFT KNEE ARTHROSCOPY CHONDROPLASTY/WITH MEDIAL/LATERAL MENISECTOMIES;  Surgeon: Thera Flake., MD;  Location: [facility];  Service: Orthopedics;  Laterality: Left;   ORIF RADIUS & ULNA FRACTURES  2007   left   THORACIC DISCECTOMY N/A 03/12/2023   Procedure: THORACIC LAMINECTOMY AND  DISCECTOMY;  Surgeon: Coletta Memos, MD;  Location: [facility];  Service: Neurosurgery;  Laterality: N/A;   Patient Active Problem List   Diagnosis Date Noted   Cystitis 05/21/2023   Acute diverticulitis 05/21/2023   Vitamin D deficiency 05/14/2023   Low HDL (under 40) 05/14/2023   GERD (gastroesophageal reflux disease) 04/13/2023   Constipation 04/13/2023   Chronic back pain 04/13/2023   DVT, bilateral lower limbs (HCC) 04/13/2023   Coping style affecting medical condition 03/27/2023   Thoracic myelopathy with LE weakness 03/23/2023   HNP (herniated nucleus pulposus with myelopathy), thoracic 03/12/2023   Hyperlipidemia 12/24/2022   Failure to thrive in adult 12/24/2022   Tobacco abuse 03/18/2018   BPH associated with nocturia 03/11/2017   History of substance abuse (HCC) 03/11/2017   Restless leg syndrome 03/11/2017   Restrictive lung disease 05/14/2016   OSA on CPAP 05/14/2016   Moderate COPD (chronic obstructive pulmonary disease) (HCC) 05/14/2016   Smoking greater than 40 pack years 05/14/2016   SOB (shortness of breath) 10/06/2014   Edema of extremities 10/06/2014   Benign essential HTN 10/06/2014  Type 2 diabetes mellitus, without long-term current use of insulin (HCC) 07/18/2011    PCP: Arlon Bergamo, MD  REFERRING PROVIDER:  Sterling Eisenmenger, PA-C   REFERRING DIAG: [id number] (ICD-10-CM) - Other spondylosis with myelopathy, thoracic region   THERAPY DIAG:  Other abnormalities of gait and mobility  Muscle weakness (generalized)  Chronic pain of right knee  Chronic pain of left knee  Unsteadiness on feet  Rationale for Evaluation and Treatment: Rehabilitation  ONSET DATE: October 2024  SUBJECTIVE:                                                                                                                                                                                                          SUBJECTIVE STATEMENT:  I hope we're doing something fun first like standing up. I got depressed sitting in my WC all day here recently. Saw Dr. Alessandra Ancona recently, I really like her, she's a good doctor. My legs are messed up, legs get big and split open then they get tiny and then I don't know what's going on with them. Have been trying to fellowship with people at church, they're 60 years old and in better shape than me. BPs have mostly been good at home, 150s/70s mostly some higher some lower.   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 He was found to have large HNP T10/T11 and underwent T10 laminectomy with discectomy by Dr. Michale Age on 03/12/2023   PAIN:  Are you having pain? Yes: NPRS scale: 3/10 Pain location: toothache  Pain description: throbbing  Aggravating factors: nothing in PT  Relieving factors: nothing in PT     PRECAUTIONS: None  RED FLAGS: None     WEIGHT BEARING RESTRICTIONS: No  FALLS:  Has patient fallen in last 6 months? Yes. Number of falls 1 -- last fall Oct 2024  LIVING ENVIRONMENT: Lives with: lives with their spouse Lives in: House/apartment Stairs:  ramp Has following equipment at home: Environmental consultant - 2 wheeled, Wheelchair (manual), and upright walker, hospital bed  OCCUPATION: Retired  PLOF:  Able to do his own ADLs but easier to have wife assist him  PATIENT GOALS: Walk with the walker at least household distances  NEXT MD VISIT: 04/21/23 [place]  OBJECTIVE:  Note: Objective measures were completed at Evaluation unless otherwise noted.  DIAGNOSTIC FINDINGS:  03/12/23 MRI THORACIC  SPINE IMPRESSION:   1.  Moderate-sized lobulated left subarticular to foraminal disc protrusion at T10-11 with resultant severe spinal stenosis. Cord is compressed and deviated to the right at this level. Associated cord signal changes concerning for edema and/or myelomalacia. 2. No other acute abnormality within the thoracic spine. 3. Degenerative spondylosis at T8-9 through T11-12 without significant spinal stenosis. Associated moderate to severe bilateral foraminal narrowing at these levels as above.  MRI LUMBAR SPINE IMPRESSION:   1. No acute abnormality within the lumbar spine. 2. Multifactorial degenerative changes at L4-5 with resultant mild canal with severe left and moderate right lateral recess stenosis, with severe bilateral L4 foraminal narrowing. 3. Additional mild noncompressive disc bulging and facet hypertrophy elsewhere within the lumbar spine as above. No other significant stenosis or frank neural impingement.   Critical Value/emergent results were called by telephone at the time of interpretation on 03/12/2023 at 12:41 am to provider Dr. Madilyn Hook, who verbally acknowledged these results.  PATIENT SURVEYS:  FOTO 42; predicted 54; 06/15/23- 27; 07/15/23- 37   COGNITION: Overall cognitive status: Within functional limits for tasks assessed  SENSATION: WFL  POSTURE: rounded shoulders and forward head  PALPATION: No overt tenderness to palpation   CERVICAL ROM: WNL  LOWER EXTREMITY MMT:    MMT Right eval Left eval Right 07/15/23 Left 07/15/23 Right 08/17/23 Left  08/17/23  Hip flexion 3+ 3 4+ 3 4 4+  Hip extension 3+ 3+      Hip abduction 3+ 3+ 4 4 4 4   Hip adduction        Hip internal rotation        Hip external rotation        Knee flexion 4- 3+   3+ 3+  Knee extension 4- 3+ 4+ 4+ 3+ 4+  Ankle dorsiflexion   4+ 4+ 4+ 4  Ankle plantarflexion        Ankle inversion        Ankle eversion         (Blank rows = not tested)   FUNCTIONAL TESTS:  Transfers:  Chair to bed:  Multiple seated scoots with UEs  Sit<>stand: max A -- limited due to knee pain Supine to sit<>sit: min A for trunk and LE negotiation. Not performing log rolling  TODAY'S TREATMENT:                                                                                                                              DATE: 09-14-23  Aquatic PT at [facility] - pool temp 90 degrees  Patient seen for aquatic therapy today.  Treatment took place in water 3.6-4.5 feet deep depending upon activity.  Pt entered and exited the pool via chair lift. Pt transferred to/from wheelchair to chair lift with use of sliding board with CGA to min assist for LE positioning when exiting pool.  Harriet Butte, PT, assisted with treatment.  Pt performed static standing at pool wall with cues for knee  extension as able/tolerated with +2 min assist - knees blocked into extension by PT's - pt had flexor tone in bil. LE's in today's session  Gait training with water walker approx. 4 reps with +2 min assist  Attempted gait training with use of +2 assist from PT with pt holding onto large single barbell - this flotation device did not provide enough support for pt  Returned to use of water walker 18' x 2 reps with mod assist   Pt performed supine exercises - hip abdct./adduction approx. 20 reps; bicycling LE's approx. 10 reps; simulated leg press exercise for closed chain strengthening - pt was pushed into LE flexion toward pool wall (supported in supine by PT) - pushed away from wall into hip and knee extension for strengthening - approx. 20 reps; spasms noted to occur with this exercise, RLE > LLE  Pt was floated to bench in 3.6' depth area - sat on pool bench - performed LAQ's RLE and LLE approx. 10 reps each  Pt was floated from bench to chair lift for exiting pool after performing seated LAQ exercise   Pt requires buoyancy of water for support for reduced fall risk and for unloading/reduced stress on joints (B knees) as pt able  to tolerate increased standing and ambulation in water compared to that on land; viscosity of water is needed for resistance for strengthening and current of water provides perturbations for challenge for balance training           PATIENT EDUCATION:  Education details: aquatic rationale Person educated: Patient Education method: Programmer, multimedia, Demonstration, and Handouts Education comprehension: verbalized understanding, returned demonstration, and needs further education  HOME EXERCISE PROGRAM: Access Code: [email or web address] URL: [email or web address] Date: 04/21/2023 Prepared by: Gellen April Marie Nonato  Exercises - Staggered Bridge  - 1 x daily - 7 x weekly - 2 sets - 10 reps - Hooklying Isometric Clamshell  - 1 x daily - 7 x weekly - 2 sets - 10 reps - Small Range Straight Leg Raise  - 1 x daily - 7 x weekly - 2 sets - 10 reps  ASSESSMENT:  CLINICAL IMPRESSION:  Pt returned to aquatic PT today after previous session on 06-30-23 due to having medical issues.  Pt did well with transferring to/from chair lift from his wheelchair with use of sliding board.  Pt had flexor tone/spasticity in bil. LE's with RLE> LLE which impacted consistency with weight bearing in today's session.  Pt reported knee pain with water walking and with weight bearing.  Pt unable to use single barbell for UE support due to LE weakness and flexor tone impacting extension and full LE weight bearing.  Pt needed support of water walker with ambulation in 4' water depth.  Cont with POC. Note:  pt informed 2nd PT assisting with today's treatment that he had not taken full doses of Baclofen prior to today's session, which [place] have contributed to increased spasms in bil. LE's; pt was informed to take Baclofen as prescribed prior to next aquatic PT session.     OBJECTIVE IMPAIRMENTS: Abnormal gait, decreased balance, decreased endurance, decreased mobility, difficulty walking, decreased ROM, decreased strength,  increased edema, improper body mechanics, postural dysfunction, obesity, and pain.   GOALS: Goals reviewed with patient? Yes  SHORT TERM GOALS: Target date: 09/07/2023       Pt will be ind with initial HEP Baseline:  Goal status: 05/06/23- provided, but inconsistent in performing. Ongoing 07/15/23  2.  Pt will be able to tolerate standing  x 1 min in the parallel bars with BUE support and CGA Baseline: 06/01/23- Goal updated after recent hospitalization Goal status: 07/15/23 unable to assess due to elevated BP  3.  Pt will be able to perform sliding board transfer with S, including set up, and up a slight (3") grade Baseline: 06/01/23- Goal updated after recent hospitalization Goal status:  07/15/23 PARTIALLY MET    LONG TERM GOALS: Target date: 09/28/2023       Pt will be ind with management and progression of HEP Baseline:  Goal status: ONGOING 07/15/23  2.  Pt will be able to perform stand-pivot transfers with RW and no more than MinA  Baseline:  Goal status: GOAL MODIFIED  07/15/23  3.  Pt will be able to perform at least 2 reps of sit to stand in 60 sec with UE support Baseline: Unable to perform complete STS without mod A Goal status: GOAL MODIFIED 07/15/23  4.  Pt will have increased FOTO to >/=54 Baseline:  Goal status: ONGOING 07/15/23  PLAN:  PT FREQUENCY: 1-2x/week  PT DURATION: 6 weeks  PLANNED INTERVENTIONS: 97164- PT Re-evaluation, 97110-Therapeutic exercises, 97530- Therapeutic activity, 97112- Neuromuscular re-education, 97535- Self Care, 16109- Manual therapy, [phone]- Gait training, [phone]- Orthotic Fit/training, [phone]- Aquatic Therapy, [phone]- Electrical stimulation (unattended), 97035- Ultrasound, 13086- Ionotophoresis 4mg /ml Dexamethasone, Patient/Family education, Balance training, Stair training, Taping, Dry Needling, Joint mobilization, Spinal mobilization, Cryotherapy, and Moist heat  PLAN FOR NEXT SESSION: focus on water PT for maximal functional  gains; closely monitor BP and overall medical status, education PRN; get back in the water if water staff feels he is medically appropriate   Barb Bonito, PT 09/15/23 7:24 PM

## 2023-09-16 ENCOUNTER — Other Ambulatory Visit: Payer: Self-pay | Admitting: Physical Medicine and Rehabilitation

## 2023-09-25 ENCOUNTER — Encounter: Attending: Physical Medicine and Rehabilitation | Admitting: Registered Nurse

## 2023-09-25 ENCOUNTER — Encounter: Payer: Self-pay | Admitting: Registered Nurse

## 2023-09-25 VITALS — BP 136/83 | HR 97 | Ht 71.0 in | Wt 326.0 lb

## 2023-09-25 DIAGNOSIS — Z5181 Encounter for therapeutic drug level monitoring: Secondary | ICD-10-CM

## 2023-09-25 DIAGNOSIS — Z79891 Long term (current) use of opiate analgesic: Secondary | ICD-10-CM | POA: Diagnosis present

## 2023-09-25 DIAGNOSIS — M4714 Other spondylosis with myelopathy, thoracic region: Secondary | ICD-10-CM

## 2023-09-25 DIAGNOSIS — M545 Low back pain, unspecified: Secondary | ICD-10-CM | POA: Diagnosis present

## 2023-09-25 DIAGNOSIS — M542 Cervicalgia: Secondary | ICD-10-CM | POA: Diagnosis present

## 2023-09-25 DIAGNOSIS — M25512 Pain in left shoulder: Secondary | ICD-10-CM | POA: Diagnosis present

## 2023-09-25 DIAGNOSIS — G5793 Unspecified mononeuropathy of bilateral lower limbs: Secondary | ICD-10-CM | POA: Diagnosis present

## 2023-09-25 DIAGNOSIS — G894 Chronic pain syndrome: Secondary | ICD-10-CM

## 2023-09-25 DIAGNOSIS — G8929 Other chronic pain: Secondary | ICD-10-CM | POA: Diagnosis present

## 2023-09-25 DIAGNOSIS — M25511 Pain in right shoulder: Secondary | ICD-10-CM | POA: Diagnosis present

## 2023-09-25 MED ORDER — OXYCODONE-ACETAMINOPHEN 10-325 MG PO TABS: 1.0000 | ORAL_TABLET | Freq: Four times a day (QID) | ORAL | 0 refills | Status: DC | PRN

## 2023-09-25 MED ORDER — TIZANIDINE HCL 2 MG PO TABS: 2.0000 mg | ORAL_TABLET | Freq: Every day | ORAL | 1 refills | Status: DC

## 2023-09-25 NOTE — Progress Notes (Signed)
 Subjective:    Patient ID: Raymond [name], male    DOB: 10/23/1965, 58 y.o.   MRN: [id number]  HPI: Raymond [name] is a 58 y.o. male who returns for follow up appointment for chronic pain and medication refill. He states his pain is located in his neck, bilateral shoulder pain,  lower back, .and bilateral lower extremities with tingling. He rates his  pain 5. His current exercise regime is going to physical therapy two days a week and aquatic therapy monthly. Raymond [name] reports he is wheelchair dependant.   Raymond [name] Morphine  equivalent is 60.00 MME. He  is also prescribed Alprazolam   by Raymond [name]  .We have discussed the black box warning of using opioids and benzodiazepines. I highlighted the dangers of using these drugs together and discussed the adverse events including respiratory suppression, overdose, cognitive impairment and importance of compliance with current regimen. We will continue to monitor and adjust as indicated.    Last Oral Swab was Performed on 08/06/2023, it was consistent.      Pain Inventory Average Pain 8 Pain Right Now 5 My pain is dull and aching  In the last 24 hours, has pain interfered with the following? General activity 5 Relation with others 5 Enjoyment of life 10 What TIME of day is your pain at its worst? morning  and night Sleep (in general) Fair  Pain is worse with: bending and unsure Pain improves with: rest, heat/ice, therapy/exercise, and medication Relief from Meds: 5  Family History  Problem Relation Age of Onset   Diabetes Mother    Hypertension Mother    High Cholesterol Mother    Heart disease Mother    Depression Mother    Diabetes Father    Hypertension Father    High Cholesterol Father    Heart disease Father    Cancer Other    Social History   Socioeconomic History   Marital status: Single    Spouse name: Not on file   Number of children: Not on file   Years of education: Not on file   Highest education level: Not on file   Occupational History   Not on file  Tobacco Use   Smoking status: Every Day    Current packs/day: 2.00    Types: Cigarettes   Smokeless tobacco: Never  Vaping Use   Vaping status: Former  Substance and Sexual Activity   Alcohol use: Yes    Comment: rare   Drug use: No   Sexual activity: Not on file  Other Topics Concern   Not on file  Social History Narrative   Not on file   Social Drivers of Health   Financial Resource Strain: Not on file  Food Insecurity: No Food Insecurity (05/21/2023)   Hunger Vital Sign    Worried About Running Out of Food in the Last Year: Never true    Ran Out of Food in the Last Year: Never true  Transportation Needs: No Transportation Needs (05/21/2023)   PRAPARE - Administrator, Civil Service (Medical): No    Lack of Transportation (Non-Medical): No  Physical Activity: Not on file  Stress: No Stress Concern Present (03/21/2022)   Received from [facility], [facility] - Occupational Stress Questionnaire    Feeling of Stress : Not at all  Social Connections: Unknown (01/10/2022)   Received from [facility], [facility]   Social Network    Social Network: Not on  file   Past Surgical History:  Procedure Laterality Date   CHONDROPLASTY Left 06/28/2014   Procedure: CHONDROPLASTY;  Surgeon: Forbes Ida., MD;  Location: [facility];  Service: Orthopedics;  Laterality: Left;   COLONOSCOPY     FOOT FASCIOTOMY     age 76-rt   KNEE ARTHROSCOPY WITH LATERAL MENISECTOMY Left 06/28/2014   Procedure: KNEE ARTHROSCOPY WITH LATERAL MENISECTOMY;  Surgeon: Forbes Ida., MD;  Location: [facility];  Service: Orthopedics;  Laterality: Left;   KNEE ARTHROSCOPY WITH MEDIAL MENISECTOMY Left 06/28/2014   Procedure: LEFT KNEE ARTHROSCOPY CHONDROPLASTY/WITH MEDIAL/LATERAL MENISECTOMIES;  Surgeon: Forbes Ida., MD;  Location: [facility];  Service:  Orthopedics;  Laterality: Left;   ORIF RADIUS & ULNA FRACTURES  2007   left   THORACIC DISCECTOMY N/A 03/12/2023   Procedure: THORACIC LAMINECTOMY AND  DISCECTOMY;  Surgeon: Audie Bleacher, MD;  Location: [facility];  Service: Neurosurgery;  Laterality: N/A;   Past Surgical History:  Procedure Laterality Date   CHONDROPLASTY Left 06/28/2014   Procedure: CHONDROPLASTY;  Surgeon: Forbes Ida., MD;  Location: [facility];  Service: Orthopedics;  Laterality: Left;   COLONOSCOPY     FOOT FASCIOTOMY     age 76-rt   KNEE ARTHROSCOPY WITH LATERAL MENISECTOMY Left 06/28/2014   Procedure: KNEE ARTHROSCOPY WITH LATERAL MENISECTOMY;  Surgeon: Forbes Ida., MD;  Location: [facility];  Service: Orthopedics;  Laterality: Left;   KNEE ARTHROSCOPY WITH MEDIAL MENISECTOMY Left 06/28/2014   Procedure: LEFT KNEE ARTHROSCOPY CHONDROPLASTY/WITH MEDIAL/LATERAL MENISECTOMIES;  Surgeon: Forbes Ida., MD;  Location: [facility];  Service: Orthopedics;  Laterality: Left;   ORIF RADIUS & ULNA FRACTURES  2007   left   THORACIC DISCECTOMY N/A 03/12/2023   Procedure: THORACIC LAMINECTOMY AND  DISCECTOMY;  Surgeon: Audie Bleacher, MD;  Location: [facility];  Service: Neurosurgery;  Laterality: N/A;   Past Medical History:  Diagnosis Date   Acute kidney injury superimposed on chronic kidney disease (HCC) 04/13/2023   Acute on chronic anemia 04/13/2023   Anemia    Anxiety    Back pain    COPD (chronic obstructive pulmonary disease) (HCC)    Depression    Diabetes mellitus without complication (HCC)    Diverticulosis    with hx of LGIB   Edema    GERD (gastroesophageal reflux disease)    GIB (gastrointestinal bleeding)    Gout    Heavy smoker    Hyperlipidemia    Hypertension    Iron deficiency anemia    Sleep apnea    uses a cpap   Swelling of lower extremity    BP 136/83   Pulse 97   Ht 5\' 11"  (1.803 m)   Wt (!) 326 lb (147.9 kg) Comment: last recorded  SpO2 91%    BMI 45.47 kg/m   Opioid Risk Score:   Fall Risk Score:  `1  Depression screen [facility] 2/9     09/25/2023   10:50 AM  Depression screen PHQ 2/9  Decreased Interest 0  Down, Depressed, Hopeless 0  PHQ - 2 Score 0    Review of Systems  Musculoskeletal:  Positive for arthralgias and myalgias.  All other systems reviewed and are negative.      Objective:   Physical Exam Vitals and nursing note reviewed.  Constitutional:      Appearance: Normal appearance.  Neck:     Comments: Cervical Paraspinal Tenderness:  C-5-C-6 Cardiovascular:     Rate and Rhythm: Normal rate and regular rhythm.     Pulses: Normal pulses.     Heart sounds: Normal heart sounds.  Pulmonary:     Effort: Pulmonary effort is normal.     Breath sounds: Normal breath sounds.  Musculoskeletal:     Comments: Normal Muscle Bulk and Muscle Testing Reveals:  Upper Extremities: Right: Decreased ROM 45 Degrees  and Muscle Strength 5/5 Left Upper Extremity: Full ROM and Muscle Strength 5/5 Lumbar Paraspinal Tenderness: L-4-L-5  Lower Extremities: Decreased ROM and Bilateral Lower extremities Flexion produces Pain into his bilateral lower Extremities and Bilateral Patella's Arrived in wheelchair    Skin:    General: Skin is warm and dry.  Neurological:     Mental Status: He is alert and oriented to person, place, and time.  Psychiatric:        Mood and Affect: Mood normal.        Behavior: Behavior normal.         Assessment & Plan:  Cervicalgia: Continue HEP as Tolerated. Continue to monitor.  Chronic Pain in Bilateral Shoulders: Continue HEP as Tolerated. Continue to Monitor.  Thoracic Myelopathy: Continue Baclofen  and Aquatic Therapy. Continue to Monitor.  Chronic Bilateral Low Back Pain without Sciatica: Continue HEP as Tolerated. Continue to Monitor.  Neuropathic Pain of Bilateral Lower Extremities: Continue current medication regimen. Continue to monitor.  6. Chronic Pain Syndrome: Refilled: Oxycodone   10/325 mg one tablet 4 times a day as needed for pain #120. We will continue the opioid monitoring program, this consists of regular clinic visits, examinations, urine drug screen, pill counts as well as use of [place]  Controlled Substance Reporting system. A 12 month History has been reviewed on the [place]  Controlled Substance Reporting System on 10/02/2023  F/U with Dr. Lovorn

## 2023-10-05 ENCOUNTER — Ambulatory Visit: Payer: PRIVATE HEALTH INSURANCE | Attending: Physical Medicine and Rehabilitation | Admitting: Physical Therapy

## 2023-10-05 DIAGNOSIS — G8929 Other chronic pain: Secondary | ICD-10-CM | POA: Diagnosis present

## 2023-10-05 DIAGNOSIS — R2689 Other abnormalities of gait and mobility: Secondary | ICD-10-CM | POA: Diagnosis present

## 2023-10-05 DIAGNOSIS — M25561 Pain in right knee: Secondary | ICD-10-CM | POA: Insufficient documentation

## 2023-10-05 DIAGNOSIS — M25562 Pain in left knee: Secondary | ICD-10-CM | POA: Diagnosis present

## 2023-10-05 DIAGNOSIS — M6281 Muscle weakness (generalized): Secondary | ICD-10-CM | POA: Diagnosis present

## 2023-10-05 DIAGNOSIS — R2681 Unsteadiness on feet: Secondary | ICD-10-CM | POA: Diagnosis present

## 2023-10-06 ENCOUNTER — Encounter: Payer: Self-pay | Admitting: Physical Therapy

## 2023-10-06 NOTE — Therapy (Signed)
 OUTPATIENT PHYSICAL THERAPY THORACIC/AQUATIC THERAPY NOTE/RE-CERT     Patient Name: Raymond [name] MRN: [id number] DOB:02/26/1966, 58 y.o., male Today's Date: 10/06/2023  END OF SESSION:  PT End of Session - 10/06/23 1944     Visit Number 20    Number of Visits 27    Date for PT Re-Evaluation 12/11/23    PT Start Time 1315    PT Stop Time 1412    PT Time Calculation (min) 57 min    Equipment Utilized During Treatment Other (comment)   water walker, large barbell   Activity Tolerance Patient tolerated treatment well    Behavior During Therapy WFL for tasks assessed/performed               Past Medical History:  Diagnosis Date   Acute kidney injury superimposed on chronic kidney disease (HCC) 04/13/2023   Acute on chronic anemia 04/13/2023   Anemia    Anxiety    Back pain    COPD (chronic obstructive pulmonary disease) (HCC)    Depression    Diabetes mellitus without complication (HCC)    Diverticulosis    with hx of LGIB   Edema    GERD (gastroesophageal reflux disease)    GIB (gastrointestinal bleeding)    Gout    Heavy smoker    Hyperlipidemia    Hypertension    Iron deficiency anemia    Sleep apnea    uses a cpap   Swelling of lower extremity    Past Surgical History:  Procedure Laterality Date   CHONDROPLASTY Left 06/28/2014   Procedure: CHONDROPLASTY;  Surgeon: Forbes Ida., MD;  Location: [facility];  Service: Orthopedics;  Laterality: Left;   COLONOSCOPY     FOOT FASCIOTOMY     age 20-rt   KNEE ARTHROSCOPY WITH LATERAL MENISECTOMY Left 06/28/2014   Procedure: KNEE ARTHROSCOPY WITH LATERAL MENISECTOMY;  Surgeon: Forbes Ida., MD;  Location:  SUR[facility]ERY C[facility];  Service: Orthopedics;  Laterality: Left;   KNEE ARTHROSCOPY WITH MEDIAL MENISECTOMY Left 06/28/2014   Procedure: LEFT KNEE ARTHROSCOPY CHONDROPLASTY/WITH MEDIAL/LATERAL MENISECTOMIES;  Surgeon: Forbes Ida., MD;  Location:  SUR[facility]ERY CENTER;  Service:  Orthopedics;  Laterality: Left;   ORIF RADIUS & ULNA FRACTURES  2007   left   THORACIC DISCECTOMY N/A 03/12/2023   Procedure: THORACIC LAMINECTOMY AND  DISCECTOMY;  Surgeon: Audie Bleacher, MD;  Location: [facility];  Service: Neurosurgery;  Laterality: N/A;   Patient Active Problem List   Diagnosis Date Noted   Cystitis 05/21/2023   Acute diverticulitis 05/21/2023   Vitamin D  deficiency 05/14/2023   Low HDL (under 40) 05/14/2023   GERD (gastroesophageal reflux disease) 04/13/2023   Constipation 04/13/2023   Chronic back pain 04/13/2023   DVT, bilateral lower limbs (HCC) 04/13/2023   Coping style affecting medical condition 03/27/2023   Thoracic myelopathy with LE weakness 03/23/2023   HNP (herniated nucleus pulposus with myelopathy), thoracic 03/12/2023   Hyperlipidemia 12/24/2022   Failure to thrive  in adult 12/24/2022   Tobacco abuse 03/18/2018   BPH associated with nocturia 03/11/2017   History of substance abuse (HCC) 03/11/2017   Restless leg syndrome 03/11/2017   Restrictive lung disease 05/14/2016   OSA on CPAP 05/14/2016   Moderate COPD (chronic obstructive pulmonary disease) (HCC) 05/14/2016   Smoking greater than 40 pack years 05/14/2016   SOB (shortness of breath) 10/06/2014   Edema of extremities 10/06/2014   Benign essential HTN 10/06/2014   Type 2 diabetes mellitus, without  long-term current use of insulin  (HCC) 07/18/2011    PCP: Arlon Bergamo, MD  REFERRING PROVIDER:  Laverle Postin, MD   REFERRING DIAG: [phone] (ICD-10-CM) - Other spondylosis with myelopathy, thoracic region   THERAPY DIAG:  Other abnormalities of gait and mobility  Muscle weakness (generalized)  Chronic pain of right knee  Chronic pain of left knee  Unsteadiness on feet  Rationale for Evaluation and Treatment: Rehabilitation  ONSET DATE: October 2024  SUBJECTIVE:                                                                                                                                                                                                          SUBJECTIVE STATEMENT:  Pt reports he took the Baclofen  prior to pool appt today - hopefully spasms in legs won't be as bad as they were last time  Hand dominance: Right  PERTINENT HISTORY:  Broken R ankle x2; history of COPD, T2DM, OSA, tobacco use, CKD 111a, morbid obesity who was admitted on 03/11/2023 with BLE weakness with decreased coordination and gait disorder as well as reports of urinary incontinence. He was found to have large HNP T10/T11 and underwent T10 laminectomy with discectomy by Dr. Michale Age on 03/12/2023   PAIN:  Are you having pain? Yes: NPRS scale: 3/10 Pain location: toothache  Pain description: throbbing  Aggravating factors: nothing in PT  Relieving factors: nothing in PT     PRECAUTIONS: None  RED FLAGS: None     WEIGHT BEARING RESTRICTIONS: No  FALLS:  Has patient fallen in last 6 months? Yes. Number of falls 1 -- last fall Oct 2024  LIVING ENVIRONMENT: Lives with: lives with their spouse Lives in: House/apartment Stairs:  ramp Has following equipment at home: Environmental consultant - 2 wheeled, Wheelchair (manual), and upright walker, hospital bed  OCCUPATION: Retired  PLOF:  Able to do his own ADLs but easier to have wife assist him  PATIENT GOALS: Walk with the walker at least household distances  NEXT MD VISIT: 04/21/23 [place]  OBJECTIVE:  Note: Objective measures were completed at Evaluation unless otherwise noted.  DIAGNOSTIC FINDINGS:  03/12/23 MRI THORACIC SPINE IMPRESSION:   1. Moderate-sized lobulated left subarticular to foraminal disc protrusion at T10-11 with resultant severe spinal stenosis. Cord is compressed and deviated to the right at this level. Associated cord signal changes concerning for edema and/or myelomalacia. 2. No other acute abnormality within the thoracic spine. 3. Degenerative spondylosis at T8-9 through T11-12 without significant  spinal stenosis. Associated moderate to severe bilateral foraminal narrowing at these levels as  above.  MRI LUMBAR SPINE IMPRESSION:   1. No acute abnormality within the lumbar spine. 2. Multifactorial degenerative changes at L4-5 with resultant mild canal with severe left and moderate right lateral recess stenosis, with severe bilateral L4 foraminal narrowing. 3. Additional mild noncompressive disc bulging and facet hypertrophy elsewhere within the lumbar spine as above. No other significant stenosis or frank neural impingement.   Critical Value/emergent results were called by telephone at the time of interpretation on 03/12/2023 at 12:41 am to provider Dr. Monique Ano, who verbally acknowledged these results.  PATIENT SURVEYS:  FOTO 42; predicted 54; 06/15/23- 27; 07/15/23- 37   COGNITION: Overall cognitive status: Within functional limits for tasks assessed  SENSATION: WFL  POSTURE: rounded shoulders and forward head  PALPATION: No overt tenderness to palpation   CERVICAL ROM: WNL  LOWER EXTREMITY MMT:    MMT Right eval Left eval Right 07/15/23 Left 07/15/23 Right 08/17/23 Left  08/17/23  Hip flexion 3+ 3 4+ 3 4 4+  Hip extension 3+ 3+      Hip abduction 3+ 3+ 4 4 4 4   Hip adduction        Hip internal rotation        Hip external rotation        Knee flexion 4- 3+   3+ 3+  Knee extension 4- 3+ 4+ 4+ 3+ 4+  Ankle dorsiflexion   4+ 4+ 4+ 4  Ankle plantarflexion        Ankle inversion        Ankle eversion         (Blank rows = not tested)   FUNCTIONAL TESTS:  Transfers:  Chair to bed: Multiple seated scoots with UEs  Sit<>stand: max A -- limited due to knee pain Supine to sit<>sit: min A for trunk and LE negotiation. Not performing log rolling  TODAY'S TREATMENT:                                                                                                                              DATE: 10-05-23  Aquatic PT at [facility] - pool temp 90 degrees: Terri Fester, PT, assisted with treatment session  Patient seen for aquatic therapy today.  Treatment took place in water 3.6-4.5 feet deep depending upon activity.  Pt entered and exited the pool via chair lift. Pt transferred to/from wheelchair to chair lift with use of sliding board with CGA to min assist for LE positioning when exiting pool.  Terri Fester, PT, assisted with treatment.  Pt performed static standing at pool wall with cues for knee extension as able/tolerated with +2 min assist - knees blocked into extension by PT's - pt had flexor tone in bil. LE's in today's session)  Gait training with water walker approx. 4 laps (18' x 8) with +2 min assist; flexor tone with spasms occurred in bil. LE's   Attempted gait training with use of +2 assist from PT with pt holding onto  large single barbell - this flotation device did not provide enough support for pt so returned to use of water walker 18' x 2 reps with mod to min assist -   Pt performed supine exercises - hip abdct./adduction approx. 20 reps; bicycling LE's approx. 10 reps; simulated leg press exercise for closed chain strengthening - pt was pushed into LE flexion toward pool wall (supported in supine by PT) - pushed away from wall into hip and knee extension for strengthening - approx. 10 reps; spasms noted to occur with this exercise (bil. LE's)  Pt was floated to bench in 3.6' depth area - sat on pool bench - performed LAQ's RLE and LLE approx. 10 reps each  Attempted sit to stand from pool bench (feet on step for contact with surface) - performed 3 reps due to c/o pain in knees with this exercise  Pt requires buoyancy of water for support for reduced fall risk and for unloading/reduced stress on joints (B knees) as pt able to tolerate increased standing and ambulation in water compared to that on land; viscosity of water is needed for resistance for strengthening and current of water provides perturbations for challenge for balance  training           PATIENT EDUCATION:  Education details: aquatic rationale Person educated: Patient Education method: Programmer, multimedia, Demonstration, and Handouts Education comprehension: verbalized understanding, returned demonstration, and needs further education  HOME EXERCISE PROGRAM: Access Code: [email or web address] URL: [email or web address] Date: 04/21/2023 Prepared by: Gellen April Marie Nonato  Exercises - Staggered Bridge  - 1 x daily - 7 x weekly - 2 sets - 10 reps - Hooklying Isometric Clamshell  - 1 x daily - 7 x weekly - 2 sets - 10 reps - Small Range Straight Leg Raise  - 1 x daily - 7 x weekly - 2 sets - 10 reps  ASSESSMENT:  CLINICAL IMPRESSION:  Pt has not met any of initial LTG's: LTG #1 is ongoing as HEP continues to be established. LTG's #2 and 3 not met as pt requires mod assist with sit to stand transfers due to c/o bil. Knee pain and also due to flexor tone/spasticity. Pt requires +2 assist for safety with aquatic therapy due to dependencies with functional mobility and due to spasms in bil. LE's.  Pt requires water walker for assistance with ambulation for support and assist with balance.  Pt unable to perform sit to stand transfer from pool bench holding large barbell due to severe c/o bil. Knee pain with this transfer.  Plan to continue aquatic PT for 8 sessions.     OBJECTIVE IMPAIRMENTS: Abnormal gait, decreased balance, decreased endurance, decreased mobility, difficulty walking, decreased ROM, decreased strength, increased edema, improper body mechanics, postural dysfunction, obesity, and pain.   GOALS: Goals reviewed with patient? Yes  SHORT TERM GOALS: Target date: 09/07/2023       Pt will be ind with initial HEP Baseline:  Goal status: 05/06/23- provided, but inconsistent in performing. Ongoing 07/15/23  2.  Pt will be able to tolerate standing x 1 min in the parallel bars with BUE support and CGA Baseline: 06/01/23- Goal updated after  recent hospitalization Goal status: 07/15/23 unable to assess due to elevated BP  3.  Pt will be able to perform sliding board transfer with S, including set up, and up a slight (3") grade Baseline: 06/01/23- Goal updated after recent hospitalization Goal status:  07/15/23 PARTIALLY MET    LONG TERM GOALS: Target date: 09/28/2023  Pt will be ind with management and progression of HEP Baseline:  Goal status: ONGOING 07/15/23; Ongoing 10-05-23  2.  Pt will be able to perform stand-pivot transfers with RW and no more than MinA  Baseline:  Goal status: GOAL MODIFIED  07/15/23; Not met 10-05-23  3.  Pt will be able to perform at least 2 reps of sit to stand in 60 sec with UE support Baseline: Unable to perform complete STS without mod A Goal status: GOAL MODIFIED 07/15/23; Goal not met 10-05-23  4.  Pt will have increased FOTO to >/=54 Baseline:  Goal status: ONGOING 07/15/23; Deferred 10-05-23   NEW Short term goals:  Target date 10-30-23 = 4 weeks    1.  Pt will amb. 2 laps across width of pool (18' x 4 reps) with water walker with +1 min assist.    Baseline:     Goal status:  NEW    2.  Pt will report ability to stand at home with UE support on counter or at deck railing with SBA for at least 1" for LE weight bearing for tone management.           Baseline:           Goal status:  NEW  NEW Long term goals:  Target date 11-27-23 = 8 weeks    1.  Independent in HEP for aquatic exercises to be continued after D/C from PT.    Baseline:    Goal status:  Ongoing    2.  Pt will ambulate 3 laps (18' x 6 reps) across width of pool with large barbell with mod assist +1.    Baseline;    Goal status:  NEW   PLAN:  PT FREQUENCY: 1x/week  PT DURATION: 8 weeks  PLANNED INTERVENTIONS: 97164- PT Re-evaluation, 97110-Therapeutic exercises, 97530- Therapeutic activity, 97112- Neuromuscular re-education, 97535- Self Care, 16109- Manual therapy, [phone]- Gait training, [phone]- Orthotic  Fit/training, [phone]- Aquatic Therapy, [phone]- Electrical stimulation (unattended), [id number]- Ultrasound, 13086- Ionotophoresis 4mg /ml Dexamethasone , Patient/Family education, Balance training, Stair training, Taping, Dry Needling, Joint mobilization, Spinal mobilization, Cryotherapy, and Moist heat  PLAN FOR NEXT SESSION: focus on water PT for maximal functional gains  Barb Bonito, PT 10/06/23 7:49 PM

## 2023-10-12 ENCOUNTER — Ambulatory Visit: Payer: PRIVATE HEALTH INSURANCE | Admitting: Physical Therapy

## 2023-10-12 ENCOUNTER — Ambulatory Visit (HOSPITAL_BASED_OUTPATIENT_CLINIC_OR_DEPARTMENT_OTHER): Admitting: Physical Therapy

## 2023-10-13 ENCOUNTER — Ambulatory Visit: Admitting: Bariatrics

## 2023-10-14 ENCOUNTER — Ambulatory Visit: Admitting: Bariatrics

## 2023-10-15 ENCOUNTER — Ambulatory Visit (INDEPENDENT_AMBULATORY_CARE_PROVIDER_SITE_OTHER): Admitting: Bariatrics

## 2023-10-15 ENCOUNTER — Encounter: Payer: Self-pay | Admitting: Bariatrics

## 2023-10-15 VITALS — BP 146/77 | HR 65 | Temp 98.0°F | Ht 71.0 in | Wt 322.0 lb

## 2023-10-15 DIAGNOSIS — Z6841 Body Mass Index (BMI) 40.0 and over, adult: Secondary | ICD-10-CM | POA: Diagnosis not present

## 2023-10-15 DIAGNOSIS — Z7985 Long-term (current) use of injectable non-insulin antidiabetic drugs: Secondary | ICD-10-CM

## 2023-10-15 DIAGNOSIS — E559 Vitamin D deficiency, unspecified: Secondary | ICD-10-CM | POA: Diagnosis not present

## 2023-10-15 DIAGNOSIS — N1832 Chronic kidney disease, stage 3b: Secondary | ICD-10-CM

## 2023-10-15 DIAGNOSIS — E66813 Obesity, class 3: Secondary | ICD-10-CM

## 2023-10-15 DIAGNOSIS — E119 Type 2 diabetes mellitus without complications: Secondary | ICD-10-CM

## 2023-10-15 MED ORDER — MOUNJARO 12.5 MG/0.5ML ~~LOC~~ SOAJ: 12.5000 mg | SUBCUTANEOUS | 0 refills | Status: DC

## 2023-10-15 MED ORDER — VITAMIN D (ERGOCALCIFEROL) 1.25 MG (50000 UNIT) PO CAPS: 50000.0000 [IU] | ORAL_CAPSULE | ORAL | 0 refills | Status: DC

## 2023-10-15 NOTE — Progress Notes (Signed)
 WEIGHT SUMMARY AND BIOMETRICS  No data recorded No data recorded  Vitals Temp: 98 F (36.7 C) BP: (!) 146/77 Pulse Rate: 65 SpO2: 98 %   Anthropometric Measurements Height: 5\' 11"  (1.803 m) Weight: (!) 322 lb (146.1 kg) BMI (Calculated): 44.93   No data recorded No data recorded  OBESITY Raymond [name] is here to discuss his progress with his obesity treatment plan along with follow-up of his obesity related diagnoses.    Nutrition Plan: the Category 4 plan - 80% adherence.  Current exercise: Chair exercises.  Interim History:  He is down another 4 lbs since his last visit.  Eating all of the food on the plan., Protein intake is as prescribed, Is not skipping meals, and Not journaling consistently.   Pharmacotherapy: Raymond [name] is on Mounjaro  12.5 mg SQ weekly Adverse side effects: None Hunger is moderately controlled.  Cravings are moderately controlled.  Assessment/Plan:   Vitamin D  Deficiency Vitamin D  is not at goal of 50.  Most recent vitamin D  level was 18.8. He is on  prescription ergocalciferol  50,000 IU weekly. Lab Results  Component Value Date   VD25OH 18.8 (L) 05/13/2023   VD25OH 13.90 (L) 12/29/2022    Plan: Continue prescription vitamin D  50,000 IU weekly.  Will get some limited sun exposure.   Type II Diabetes HgbA1c is at goal. Last A1c was 6.0 Episodes of hypoglycemia: no Medication(s): Mounjaro  12.5 mg SQ weekly  Lab Results  Component Value Date   HGBA1C 6.0 (H) 03/12/2023   HGBA1C 6.3 (H) 12/25/2022   Lab Results  Component Value Date   LDLCALC 77 05/13/2023   CREATININE 1.87 (H) 05/25/2023   Lab Results  Component Value Date   GFR 92.19 10/05/2014    Plan: Continue and refill Mounjaro  12.5 mg SQ weekly Will keep all carbohydrates low both sweets and starches.  Will continue exercise regimen to 30 to 60 minutes on most  days of the week.  Aim for 7 to 9 hours of sleep nightly.  Eat more low glycemic index foods.  Discussed low sugar fruit.  He will continue his exercise therapy.     Morbid Obesity: Current BMI BMI (Calculated): 44.93   Pharmacotherapy Plan Continue and refill  Mounjaro  12.5 mg SQ weekly  Raymond [name] is currently in the action stage of change. As such, his goal is to continue with weight loss efforts.  He has agreed to the Category 4 plan.  Exercise goals: All adults should avoid inactivity. Some physical activity is better than none, and adults who participate in any amount of physical activity gain some health benefits.  Behavioral modification strategies: increasing lean protein intake, decreasing simple carbohydrates , no meal skipping, decrease eating out, meal planning , increase water intake, better snacking choices, and planning for success.  Raymond [name] has agreed to follow-up with our clinic in 4 weeks.     Objective:  VITALS: Per patient if applicable, see vitals. GENERAL: Alert and in no acute distress. CARDIOPULMONARY: No increased WOB. Speaking in clear sentences.  PSYCH: Pleasant and cooperative. Speech normal rate and rhythm. Affect is appropriate. Insight and judgement are appropriate. Attention is focused, linear, and appropriate.  NEURO: Oriented as arrived to appointment on time with no prompting.   Attestation Statements:   This was prepared with the assistance of Engineer, civil (consulting).  Occasional wrong-word or sound-a-like substitutions Raymond [name] have occurred due to the inherent limitations of voice recognition   Raymond Peper, DO

## 2023-10-19 ENCOUNTER — Encounter: Payer: Self-pay | Admitting: Physical Medicine and Rehabilitation

## 2023-10-19 ENCOUNTER — Ambulatory Visit: Payer: PRIVATE HEALTH INSURANCE | Admitting: Physical Therapy

## 2023-10-19 ENCOUNTER — Encounter: Attending: Physical Medicine and Rehabilitation | Admitting: Physical Medicine and Rehabilitation

## 2023-10-19 VITALS — BP 121/69 | HR 67 | Ht 71.0 in

## 2023-10-19 DIAGNOSIS — G8222 Paraplegia, incomplete: Secondary | ICD-10-CM | POA: Diagnosis not present

## 2023-10-19 DIAGNOSIS — G8929 Other chronic pain: Secondary | ICD-10-CM

## 2023-10-19 DIAGNOSIS — R252 Cramp and spasm: Secondary | ICD-10-CM

## 2023-10-19 DIAGNOSIS — M4714 Other spondylosis with myelopathy, thoracic region: Secondary | ICD-10-CM

## 2023-10-19 DIAGNOSIS — M545 Low back pain, unspecified: Secondary | ICD-10-CM

## 2023-10-19 DIAGNOSIS — R2689 Other abnormalities of gait and mobility: Secondary | ICD-10-CM

## 2023-10-19 DIAGNOSIS — Z993 Dependence on wheelchair: Secondary | ICD-10-CM

## 2023-10-19 DIAGNOSIS — M6281 Muscle weakness (generalized): Secondary | ICD-10-CM

## 2023-10-19 MED ORDER — BACLOFEN 10 MG PO TABS
15.0000 mg | ORAL_TABLET | Freq: Four times a day (QID) | ORAL | 5 refills | Status: DC
Start: 2023-10-19 — End: 2024-04-27

## 2023-10-19 MED ORDER — TIZANIDINE HCL 4 MG PO TABS: ORAL_TABLET | ORAL | 5 refills | Status: DC

## 2023-10-19 MED ORDER — OXYCODONE-ACETAMINOPHEN 10-325 MG PO TABS: 1.0000 | ORAL_TABLET | Freq: Four times a day (QID) | ORAL | 0 refills | Status: DC | PRN

## 2023-10-19 NOTE — Progress Notes (Signed)
 Subjective:    Patient ID: Raymond [name], male    DOB: Jun 29, 1965, 58 y.o.   MRN: [id number]  HPI  Pt is a 58 yr old male with hx of  HTN,  DM-  A1c 6.2- Monjaro- ; post op pain, spasticity;  Morbid obesity- max weight 400 lbs- 322 lbs today;  Here for SCI evaluations.   Lost 16 lbs in last 2 months.  Seeing a nutritionist- Dr Bevin Bucks.     Last June/July fell and broke his R ankle- getting OOB- was in hospital for 3 weeks- hasn't stood up since- was not able ot walk since then. Fell again 03/08/23- stood up and fell-  and really compressed spine.    Difficulty walking last 10 years due to knees.   Surgery 10/24-  decompression T10/11- was paralyzed from waist down and No control of B/B-     Now peeing; now has control of bowel- but hesitates when pees sometimes.  Off Flomax  for bladder.   Added  Doxazosin - BID   Done outpt therapy- still doing that- has been able to stand for 3 minutes total 1 minute each time Thursday.  Still going to [facility] as well.    Spasticity- contracts legs and will jump up-  Some days, spasms intense- legs will cross- and toes hyperextends-  Needs more control of spasticity-  Heat and ice and rotates.  Zanaflex  2 mg at bedtime for sleep- not great, but thankful for anything.  Doesn't make sleepy Doesn't take baclofen  much because not effective.  Spasms keep him up at night.  Benadryl  doesn't knock him out.    Percocet 10/325 mg- takes 3-4x/day- 2-4 depends on days.  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    Pain Inventory Average Pain 7 Pain Right Now 6 My pain is intermittent, constant, sharp, dull, and aching  LOCATION OF PAIN  Right shoulder, both knee, back , both lower legs (calf)  BOWEL Number of stools per week: 7 Oral laxative use Yes  Type of laxative Miralax  Enema or suppository use No  History of colostomy No   Incontinent No   BLADDER Normal Bladder incontinence sometimes Difficulty starting stream Yes Incomplete bladder emptying Yes    Mobility ability to climb steps?  no do you drive?  no use a wheelchair needs help with transfers Do you have any goals in this area?  yes  Function disabled: date disabled applied  I need assistance with the following:  dressing, meal prep, household duties, and shopping Do you have any goals in this area?  yes  Neuro/Psych bladder control problems weakness tremor tingling trouble walking spasms dizziness confusion anxiety  Prior Studies Any changes since last visit?  no  Physicians involved in your care Any changes since last visit?  no   Family History  Problem Relation Age of Onset   Diabetes Mother    Hypertension Mother    High Cholesterol Mother    Heart disease Mother    Depression Mother    Diabetes Father    Hypertension Father    High Cholesterol Father    Heart disease Father    Cancer Other    Social History   Socioeconomic History   Marital status: Single    Spouse name: Not on file   Number of children: Not on file   Years of education:  Not on file   Highest education level: Not on file  Occupational History   Not on file  Tobacco Use   Smoking status: Former    Current packs/day: 2.00    Types: Cigarettes   Smokeless tobacco: Never  Vaping Use   Vaping status: Never Used  Substance and Sexual Activity   Alcohol use: Yes    Comment: rare   Drug use: No   Sexual activity: Not on file  Other Topics Concern   Not on file  Social History Narrative   Not on file   Social Drivers of Health   Financial Resource Strain: Not on file  Food Insecurity: No Food Insecurity (05/21/2023)   Hunger Vital Sign    Worried About Running Out of Food in the Last Year: Never true    Ran Out of Food in the Last Year: Never true  Transportation Needs: No Transportation Needs (05/21/2023)   PRAPARE -  Administrator, Civil Service (Medical): No    Lack of Transportation (Non-Medical): No  Physical Activity: Not on file  Stress: No Stress Concern Present (03/21/2022)   Received from [facility], [facility] - Occupational Stress Questionnaire    Feeling of Stress : Not at all  Social Connections: Unknown (01/10/2022)   Received from [facility], [facility]   Social Network    Social Network: Not on file   Past Surgical History:  Procedure Laterality Date   CHONDROPLASTY Left 06/28/2014   Procedure: CHONDROPLASTY;  Surgeon: Forbes Ida., MD;  Location: [facility];  Service: Orthopedics;  Laterality: Left;   COLONOSCOPY     FOOT FASCIOTOMY     age 38-rt   KNEE ARTHROSCOPY WITH LATERAL MENISECTOMY Left 06/28/2014   Procedure: KNEE ARTHROSCOPY WITH LATERAL MENISECTOMY;  Surgeon: Forbes Ida., MD;  Location: [facility];  Service: Orthopedics;  Laterality: Left;   KNEE ARTHROSCOPY WITH MEDIAL MENISECTOMY Left 06/28/2014   Procedure: LEFT KNEE ARTHROSCOPY CHONDROPLASTY/WITH MEDIAL/LATERAL MENISECTOMIES;  Surgeon: Forbes Ida., MD;  Location: [facility];  Service: Orthopedics;  Laterality: Left;   ORIF RADIUS & ULNA FRACTURES  2007   left   THORACIC DISCECTOMY N/A 03/12/2023   Procedure: THORACIC LAMINECTOMY AND  DISCECTOMY;  Surgeon: Audie Bleacher, MD;  Location: [facility];  Service: Neurosurgery;  Laterality: N/A;   Past Medical History:  Diagnosis Date   Acute kidney injury superimposed on chronic kidney disease (HCC) 04/13/2023   Acute on chronic anemia 04/13/2023   Anemia    Anxiety    Back pain    COPD (chronic obstructive pulmonary disease) (HCC)    Depression    Diabetes mellitus without complication (HCC)    Diverticulosis    with hx of LGIB   Edema    GERD (gastroesophageal reflux disease)    GIB (gastrointestinal bleeding)    Gout    Heavy smoker     Hyperlipidemia    Hypertension    Iron deficiency anemia    Sleep apnea    uses a cpap   Swelling of lower extremity    Ht 5\' 11"  (1.803 m)   BMI 44.91 kg/m   Opioid Risk Score:   Fall Risk Score:  `1  Depression screen [facility] 2/9     10/19/2023   10:59 AM 09/25/2023   10:50 AM  Depression screen PHQ 2/9  Decreased Interest 0 0  Down, Depressed, Hopeless 0  0  PHQ - 2 Score 0 0  Altered sleeping 0   Tired, decreased energy 0   Change in appetite 0   Feeling bad or failure about yourself  0   Trouble concentrating 0   Moving slowly or fidgety/restless 0   Suicidal thoughts 0   PHQ-9 Score 0     Review of Systems  Genitourinary:        Incontinence   Musculoskeletal:        Spasms  Neurological:  Positive for dizziness and tremors.       Tingling  Psychiatric/Behavioral:  Positive for confusion.        Depression, Anxiety  All other systems reviewed and are negative.      Objective:   Physical Exam  Awake,alert, appropriate, in w/c- accompanied by wife, NAD  MSK:  LLE- HF 3-/5; KE 3+/5 DF and PF 4/5 RLE- HF 2/5; KE- 3-/5; DF 2/5; and PF 2+/5   Neuro: Decreased to light touch from T10 to S2 on R side- in  SCI pattern Decreased to light touch from upper calf to foot- in DM neuropathy pattern MAS of 3 in RLE- and 2 in LLE-  Sustained clonus B/L- but worse on R   LE edema 3+ up to upper calf B/L       Assessment & Plan:   Pt is a 58 yr old male with hx of  HTN,  DM-  A1c 6.2- Monjaro- ; post op pain, spasticity;  Morbid obesity- max weight 400 lbs- 322 lbs today;  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    Tizanidine - 4 mg nightly- x 4 days, then can increase to 6-8 mg nightly- can also take 4 mg during day 2x/day- so max dose 4 mg in AM , 4 mg in Afternoon and 8 mg at night.  Start with 4 mg at bedtime x 4 days, then 4 mg BID x 4 days, then increase to 4 mg in AM and 8 mg at bedtime x 4 days, then 4 mg in AM, 4 mg in Afternoon  and 8 mg at bedtime- for spasticity with baclofen    - Tizanidine - will make changes first with this medicine   2.  Baclofen -  15 mg 4x/day for 4 days- then increase 20 mg 4x/day- start this AFTER you finish titrating Tizanidine .   3. See how therapy does massage/Range of motion-    4. Has Estim unit- hard to work with how much fluid he has. The fluid has to be gone to make it work. Cancel the pads.    5. Con't Percocet- 10/325 mg 4x/day- as needed #120   6. If I take too much spasticity away- then can have even more difficulty standing.    7. My goals- is to make you independent- whether its at a w/c level or with a RW.    8. Wait on dantrolene- for now- for spasticity  9. SCI injury support group- 6-7 pm on last Thursday of the month- [id number]- 1st floor conference room-   10. Can discuss hand controls- for driving- can DRIVE-    11.  Viagra worked for him- but not there yet.    12. Discussed prognosis-    13. Has W/C from [employer]- light weight w/c.    14. Has R shoulder issues and B/L knees- needs knee replacements.    15. F/U in 3months- double appt- also f/u with Emilia Harbour q2 months.  SCI  16.  Refilled Percocet 10/325 mg QID- # 120- sent in  I spent a total of  54   minutes on total care today- >50% coordination of care- due to  d/w pt about SCI- as detailed above- very detailed!

## 2023-10-19 NOTE — Patient Instructions (Signed)
 Pt is a 58 yr old male with hx of  HTN,  DM-  A1c 6.2- Monjaro- ; post op pain, spasticity;  Morbid obesity- max weight 400 lbs- 322 lbs today;  Here for SCI evaluation due to T10 ASIA C paraplegia.    Tizanidine - 4 mg nightly- x 4 days, then can increase to 6-8 mg nightly- can also take 4 mg during day 2x/day- so max dose 4 mg in AM , 4 mg in Afternoon and 8 mg at night.  Start with 4 mg at bedtime x 4 days, then 4 mg BID x 4 days, then increase to 4 mg in AM and 8 mg at bedtime x 4 days, then 4 mg in AM, 4 mg in Afternoon and 8 mg at bedtime- for spasticity with baclofen    - Tizanidine - will make changes first with this medicine   2.  Baclofen -  15 mg 4x/day for 4 days- then increase 20 mg 4x/day- start this AFTER you finish titrating Tizanidine .   3. See how therapy does massage/Range of motion-    4. Has Estim unit- hard to work with how much fluid he has. The fluid has to be gone to make it work. Cancel the pads.    5. Con't Percocet- 10/325 mg 4x/day- as needed #120   6. If I take too much spasticity away- then can have even more difficulty standing.    7. My goals- is to make you independent- whether its at a w/c level or with a RW.    8. Wait on dantrolene- for now- for spasticity  9. SCI injury support group- 6-7 pm on last Thursday of the month- [id number]- 1st floor conference room-   10. Can discuss hand controls- for driving- can DRIVE-    11.  Viagra worked for him- but not there yet.    12. Discussed prognosis-    13. Has W/C from [employer]- light weight w/c.    14. Has R shoulder issues and B/L knees- needs knee replacements.    15. F/U in 3months- double appt-  SCI

## 2023-10-20 ENCOUNTER — Encounter: Payer: Self-pay | Admitting: Physical Therapy

## 2023-10-20 NOTE — Therapy (Signed)
 OUTPATIENT PHYSICAL THERAPY THORACIC/AQUATIC THERAPY NOTE/RE-CERT     Patient Name: Raymond [name] MRN: [id number] DOB:[birth year], 58 y.o., male Today's Date: 10/20/2023  END OF SESSION:  PT End of Session - 10/20/23 1041     Visit Number 21    Number of Visits 27    Date for PT Re-Evaluation 12/11/23    PT Start Time 1335   pt arrived 20" late   PT Stop Time 1436    PT Time Calculation (min) 61 min    Equipment Utilized During Treatment Other (comment)   water walker   Activity Tolerance Patient tolerated treatment well    Behavior During Therapy WFL for tasks assessed/performed               Past Medical History:  Diagnosis Date   Acute kidney injury superimposed on chronic kidney disease (HCC) 04/13/2023   Acute on chronic anemia 04/13/2023   Anemia    Anxiety    Back pain    COPD (chronic obstructive pulmonary disease) (HCC)    Depression    Diabetes mellitus without complication (HCC)    Diverticulosis    with hx of LGIB   Edema    GERD (gastroesophageal reflux disease)    GIB (gastrointestinal bleeding)    Gout    Heavy smoker    Hyperlipidemia    Hypertension    Iron deficiency anemia    Sleep apnea    uses a cpap   Swelling of lower extremity    Past Surgical History:  Procedure Laterality Date   CHONDROPLASTY Left 06/28/2014   Procedure: CHONDROPLASTY;  Surgeon: Forbes Ida., MD;  Location: [facility];  Service: Orthopedics;  Laterality: Left;   COLONOSCOPY     FOOT FASCIOTOMY     age 14-rt   KNEE ARTHROSCOPY WITH LATERAL MENISECTOMY Left 06/28/2014   Procedure: KNEE ARTHROSCOPY WITH LATERAL MENISECTOMY;  Surgeon: Forbes Ida., MD;  Location: [facility];  Service: Orthopedics;  Laterality: Left;   KNEE ARTHROSCOPY WITH MEDIAL MENISECTOMY Left 06/28/2014   Procedure: LEFT KNEE ARTHROSCOPY CHONDROPLASTY/WITH MEDIAL/LATERAL MENISECTOMIES;  Surgeon: Forbes Ida., MD;  Location: [facility];  Service:  Orthopedics;  Laterality: Left;   ORIF RADIUS & ULNA FRACTURES  2007   left   THORACIC DISCECTOMY N/A 03/12/2023   Procedure: THORACIC LAMINECTOMY AND  DISCECTOMY;  Surgeon: Audie Bleacher, MD;  Location: [facility] OR;  Service: Neurosurgery;  Laterality: N/A;   Patient Active Problem List   Diagnosis Date Noted   Chronic incomplete paraplegia (HCC) 10/19/2023   Spasticity 10/19/2023   Wheelchair dependence 10/19/2023   Cystitis 05/21/2023   Acute diverticulitis 05/21/2023   Vitamin D  deficiency 05/14/2023   Low HDL (under 40) 05/14/2023   GERD (gastroesophageal reflux disease) 04/13/2023   Constipation 04/13/2023   Chronic back pain 04/13/2023   DVT, bilateral lower limbs (HCC) 04/13/2023   Coping style affecting medical condition 03/27/2023   Thoracic myelopathy with LE weakness 03/23/2023   HNP (herniated nucleus pulposus with myelopathy), thoracic 03/12/2023   Hyperlipidemia 12/24/2022   Failure to thrive  in adult 12/24/2022   Tobacco abuse 03/18/2018   BPH associated with nocturia 03/11/2017   History of substance abuse (HCC) 03/11/2017   Restless leg syndrome 03/11/2017   Restrictive lung disease 05/14/2016   OSA on CPAP 05/14/2016   Moderate COPD (chronic obstructive pulmonary disease) (HCC) 05/14/2016   Smoking greater than 40 pack years 05/14/2016   SOB (shortness of breath) 10/06/2014  Edema of extremities 10/06/2014   Benign essential HTN 10/06/2014   Type 2 diabetes mellitus, without long-term current use of insulin  (HCC) 07/18/2011    PCP: Arlon Bergamo, MD  REFERRING PROVIDER:  Laverle Postin, MD   REFERRING DIAG: [id number] (ICD-10-CM) - Other spondylosis with myelopathy, thoracic region   THERAPY DIAG:  Other abnormalities of gait and mobility  Muscle weakness (generalized)  Chronic pain of right knee  Chronic pain of left knee  Rationale for Evaluation and Treatment: Rehabilitation  ONSET DATE: October 2024  SUBJECTIVE:                                                                                                                                                                                                          SUBJECTIVE STATEMENT:  Pt had appt with Dr. Lovorn this morning prior to scheduled pool appt; says she changed some of his medication for spasms. Pt reports the other PT facility "cut me off" but says he is now going to go back to [facility] for more PT  Hand dominance: Right  PERTINENT HISTORY:  Broken R ankle x2; history of COPD, T2DM, OSA, tobacco use, CKD 111a, morbid obesity who was admitted on 03/11/2023 with BLE weakness with decreased coordination and gait disorder as well as reports of urinary incontinence. He was found to have large HNP T10/T11 and underwent T10 laminectomy with discectomy by Dr. Michale Age on 03/12/2023   PAIN:  Are you having pain? Yes: NPRS scale: 3/10 Pain location: toothache  Pain description: throbbing  Aggravating factors: nothing in PT  Relieving factors: nothing in PT    PRECAUTIONS: None  RED FLAGS: None     WEIGHT BEARING RESTRICTIONS: No  FALLS:  Has patient fallen in last 6 months? Yes. Number of falls 1 -- last fall Oct 2024  LIVING ENVIRONMENT: Lives with: lives with their spouse Lives in: House/apartment Stairs: ramp Has following equipment at home: Environmental consultant - 2 wheeled, Wheelchair (manual), and upright walker, hospital bed  OCCUPATION: Retired  PLOF: Able to do his own ADLs but easier to have wife assist him  PATIENT GOALS: Walk with the walker at least household distances  NEXT MD VISIT: 04/21/23 [place]  OBJECTIVE:  Note: Objective measures were completed at Evaluation unless otherwise noted.  DIAGNOSTIC FINDINGS:  03/12/23 MRI THORACIC SPINE IMPRESSION:   1. Moderate-sized lobulated left subarticular to foraminal disc protrusion at T10-11 with resultant severe spinal stenosis. Cord is compressed and deviated to the right at this level. Associated  cord signal changes concerning for edema and/or myelomalacia.  2. No other acute abnormality within the thoracic spine. 3. Degenerative spondylosis at T8-9 through T11-12 without significant spinal stenosis. Associated moderate to severe bilateral foraminal narrowing at these levels as above.  MRI LUMBAR SPINE IMPRESSION:   1. No acute abnormality within the lumbar spine. 2. Multifactorial degenerative changes at L4-5 with resultant mild canal with severe left and moderate right lateral recess stenosis, with severe bilateral L4 foraminal narrowing. 3. Additional mild noncompressive disc bulging and facet hypertrophy elsewhere within the lumbar spine as above. No other significant stenosis or frank neural impingement.   Critical Value/emergent results were called by telephone at the time of interpretation on 03/12/2023 at 12:41 am to provider Dr. Monique Ano, who verbally acknowledged these results.  PATIENT SURVEYS:  FOTO 42; predicted 54; 06/15/23- 27; 07/15/23- 37   COGNITION: Overall cognitive status: Within functional limits for tasks assessed  SENSATION: WFL  POSTURE: rounded shoulders and forward head  PALPATION: No overt tenderness to palpation   CERVICAL ROM: WNL  LOWER EXTREMITY MMT:    MMT Right eval Left eval Right 07/15/23 Left 07/15/23 Right 08/17/23 Left  08/17/23  Hip flexion 3+ 3 4+ 3 4 4+  Hip extension 3+ 3+      Hip abduction 3+ 3+ 4 4 4 4   Hip adduction        Hip internal rotation        Hip external rotation        Knee flexion 4- 3+   3+ 3+  Knee extension 4- 3+ 4+ 4+ 3+ 4+  Ankle dorsiflexion   4+ 4+ 4+ 4  Ankle plantarflexion        Ankle inversion        Ankle eversion         (Blank rows = not tested)   FUNCTIONAL TESTS:  Transfers:  Chair to bed: Multiple seated scoots with UEs  Sit<>stand: max A -- limited due to knee pain Supine to sit<>sit: min A for trunk and LE negotiation. Not performing log rolling  TODAY'S TREATMENT:                                                                                                                               DATE: 10-19-23  Aquatic PT at [facility] - pool temp 90 degrees: Terri Fester, PT, assisted with treatment session  Patient seen for aquatic therapy today.  Treatment took place in water 3.6-4.5 feet deep depending upon activity.  Pt entered and exited the pool via chair lift. Pt transferred to/from wheelchair to chair lift with use of sliding board with CGA to min assist for LE positioning when exiting pool.  Terri Fester, PT, assisted with treatment.  Pt performed static standing at pool wall with cues for knee extension as able/tolerated; pt stood from chair lift holding onto water walker - 1st rep approx. 20 secs; 2nd rep - pt stood for 2" 40 secs with bil. UE support on water walker  with +2 CGA only - no blocking of knees needed as pt did not have spasms with static standing  Gait training with water walker approx. 6 laps (18' x 12) with +2 min assist; spasticity/flexor tone occurred later (in final 15") of session  Gait training with +2 HHA 18' x 4 reps - cues to stand erect  Wall bumps 10 reps - verbal cues for correct hip movement with shifting weight posteriorly   Pt performed supine exercises - hip abdct./adduction approx. 20 reps; bicycling LE's approx. 10 reps; simulated leg press exercise for closed chain strengthening - pt was pushed into LE flexion toward pool wall (supported in supine by PT) - pushed away from wall into hip and knee extension for strengthening - approx. 10 reps;  Marching in place - with bil. UE support on pool edge approx. 10 reps;  marched across width of pool 18' x 2 reps with bil. UE support on water walker  Pt requires buoyancy of water for support for reduced fall risk and for unloading/reduced stress on joints (B knees) as pt able to tolerate increased standing and ambulation in water compared to that on land; viscosity of water is needed for  resistance for strengthening and current of water provides perturbations for challenge for balance training           PATIENT EDUCATION:  Education details: aquatic rationale Person educated: Patient Education method: Programmer, multimedia, Demonstration, and Handouts Education comprehension: verbalized understanding, returned demonstration, and needs further education  HOME EXERCISE PROGRAM: Access Code: [email or web address] URL: [email or web address] Date: 04/21/2023 Prepared by: Gellen April Marie Nonato  Exercises - Staggered Bridge  - 1 x daily - 7 x weekly - 2 sets - 10 reps - Hooklying Isometric Clamshell  - 1 x daily - 7 x weekly - 2 sets - 10 reps - Small Range Straight Leg Raise  - 1 x daily - 7 x weekly - 2 sets - 10 reps  ASSESSMENT:  CLINICAL IMPRESSION:  Pt did very well with aquatic exercises in today's session as pt able to perform sit to stand from chair lift and stand for 2" 40 secs with bil. UE support on water walker with no spasms in his LE's.  Pt able to perform water walking (forwards) with bil. UE support on water walker with no spasms occurring in LE's until approx. final 15" of 1 hour session.  Attempted backwards ambulation with UE support on water walker near end of session and pt unable to perform - reported knee pain with this activity, possibly also due to muscle fatigue.  Pt demonstrated improvement in water walking and standing in today's session with significant decrease in spasms in LE's occurring.  Cont with POC.    OBJECTIVE IMPAIRMENTS: Abnormal gait, decreased balance, decreased endurance, decreased mobility, difficulty walking, decreased ROM, decreased strength, increased edema, improper body mechanics, postural dysfunction, obesity, and pain.   GOALS: Goals reviewed with patient? Yes  SHORT TERM GOALS: Target date: 09/07/2023       Pt will be ind with initial HEP Baseline:  Goal status: 05/06/23- provided, but inconsistent in performing.  Ongoing 07/15/23  2.  Pt will be able to tolerate standing x 1 min in the parallel bars with BUE support and CGA Baseline: 06/01/23- Goal updated after recent hospitalization Goal status: 07/15/23 unable to assess due to elevated BP  3.  Pt will be able to perform sliding board transfer with S, including set up, and up a slight (3") grade Baseline: 06/01/23- Goal updated  after recent hospitalization Goal status:  07/15/23 PARTIALLY MET    LONG TERM GOALS: Target date: 09/28/2023       Pt will be ind with management and progression of HEP Baseline:  Goal status: ONGOING 07/15/23; Ongoing 10-05-23  2.  Pt will be able to perform stand-pivot transfers with RW and no more than MinA  Baseline:  Goal status: GOAL MODIFIED  07/15/23; Not met 10-05-23  3.  Pt will be able to perform at least 2 reps of sit to stand in 60 sec with UE support Baseline: Unable to perform complete STS without mod A Goal status: GOAL MODIFIED 07/15/23; Goal not met 10-05-23  4.  Pt will have increased FOTO to >/=54 Baseline:  Goal status: ONGOING 07/15/23; Deferred 10-05-23   NEW Short term goals:  Target date 10-30-23 = 4 weeks    1.  Pt will amb. 2 laps across width of pool (18' x 4 reps) with water walker with +1 min assist.    Baseline:     Goal status:  NEW    2.  Pt will report ability to stand at home with UE support on counter or at deck railing with SBA for at least 1" for LE weight bearing for tone management.           Baseline:           Goal status:  NEW  NEW Long term goals:  Target date 11-27-23 = 8 weeks    1.  Independent in HEP for aquatic exercises to be continued after D/C from PT.    Baseline:    Goal status:  Ongoing    2.  Pt will ambulate 3 laps (18' x 6 reps) across width of pool with large barbell with mod assist +1.    Baseline;    Goal status:  NEW   PLAN:  PT FREQUENCY: 1x/week  PT DURATION: 8 weeks  PLANNED INTERVENTIONS: 97164- PT Re-evaluation, 97110-Therapeutic  exercises, 97530- Therapeutic activity, 97112- Neuromuscular re-education, 97535- Self Care, 78295- Manual therapy, [phone]- Gait training, [phone]- Orthotic Fit/training, [phone]- Aquatic Therapy, [phone]- Electrical stimulation (unattended), 97035- Ultrasound, 13244- Ionotophoresis 4mg /ml Dexamethasone , Patient/Family education, Balance training, Stair training, Taping, Dry Needling, Joint mobilization, Spinal mobilization, Cryotherapy, and Moist heat  PLAN FOR NEXT SESSION: focus on water PT for maximal functional gains  Barb Bonito, PT 10/20/23 10:46 AM

## 2023-10-28 ENCOUNTER — Ambulatory Visit: Admitting: Physical Therapy

## 2023-11-03 ENCOUNTER — Other Ambulatory Visit: Payer: Self-pay | Admitting: Bariatrics

## 2023-11-04 ENCOUNTER — Ambulatory Visit: Attending: Physical Medicine and Rehabilitation | Admitting: Physical Therapy

## 2023-11-04 DIAGNOSIS — R2689 Other abnormalities of gait and mobility: Secondary | ICD-10-CM | POA: Diagnosis present

## 2023-11-04 DIAGNOSIS — M25561 Pain in right knee: Secondary | ICD-10-CM | POA: Insufficient documentation

## 2023-11-04 DIAGNOSIS — G8929 Other chronic pain: Secondary | ICD-10-CM | POA: Insufficient documentation

## 2023-11-04 DIAGNOSIS — M25562 Pain in left knee: Secondary | ICD-10-CM | POA: Diagnosis present

## 2023-11-04 DIAGNOSIS — M6281 Muscle weakness (generalized): Secondary | ICD-10-CM | POA: Diagnosis present

## 2023-11-06 ENCOUNTER — Encounter: Payer: Self-pay | Admitting: Physical Therapy

## 2023-11-06 NOTE — Therapy (Signed)
 OUTPATIENT PHYSICAL THERAPY THORACIC/AQUATIC THERAPY NOTE     Patient Name: Raymond [name] MRN: [id number] DOB:September 22, 1965, 58 y.o., male Today's Date: 11/06/2023  END OF SESSION:  PT End of Session - 11/06/23 1623     Visit Number 22    Number of Visits 27    Date for PT Re-Evaluation 12/11/23    Authorization Type [employer] Managed    PT Start Time 1315    PT Stop Time 1400    PT Time Calculation (min) 45 min    Equipment Utilized During Treatment Other (comment)   water walker   Activity Tolerance Patient tolerated treatment well    Behavior During Therapy WFL for tasks assessed/performed               Past Medical History:  Diagnosis Date   Acute kidney injury superimposed on chronic kidney disease (HCC) 04/13/2023   Acute on chronic anemia 04/13/2023   Anemia    Anxiety    Back pain    COPD (chronic obstructive pulmonary disease) (HCC)    Depression    Diabetes mellitus without complication (HCC)    Diverticulosis    with hx of LGIB   Edema    GERD (gastroesophageal reflux disease)    GIB (gastrointestinal bleeding)    Gout    Heavy smoker    Hyperlipidemia    Hypertension    Iron deficiency anemia    Sleep apnea    uses a cpap   Swelling of lower extremity    Past Surgical History:  Procedure Laterality Date   CHONDROPLASTY Left 06/28/2014   Procedure: CHONDROPLASTY;  Surgeon: Forbes Ida., MD;  Location: [facility];  Service: Orthopedics;  Laterality: Left;   COLONOSCOPY     FOOT FASCIOTOMY     age 2-rt   KNEE ARTHROSCOPY WITH LATERAL MENISECTOMY Left 06/28/2014   Procedure: KNEE ARTHROSCOPY WITH LATERAL MENISECTOMY;  Surgeon: Forbes Ida., MD;  Location: [facility];  Service: Orthopedics;  Laterality: Left;   KNEE ARTHROSCOPY WITH MEDIAL MENISECTOMY Left 06/28/2014   Procedure: LEFT KNEE ARTHROSCOPY CHONDROPLASTY/WITH MEDIAL/LATERAL MENISECTOMIES;  Surgeon: Forbes Ida., MD;  Location: [facility];   Service: Orthopedics;  Laterality: Left;   ORIF RADIUS & ULNA FRACTURES  2007   left   THORACIC DISCECTOMY N/A 03/12/2023   Procedure: THORACIC LAMINECTOMY AND  DISCECTOMY;  Surgeon: Audie Bleacher, MD;  Location: [facility];  Service: Neurosurgery;  Laterality: N/A;   Patient Active Problem List   Diagnosis Date Noted   Chronic incomplete paraplegia (HCC) 10/19/2023   Spasticity 10/19/2023   Wheelchair dependence 10/19/2023   Cystitis 05/21/2023   Acute diverticulitis 05/21/2023   Vitamin D  deficiency 05/14/2023   Low HDL (under 40) 05/14/2023   GERD (gastroesophageal reflux disease) 04/13/2023   Constipation 04/13/2023   Chronic back pain 04/13/2023   DVT, bilateral lower limbs (HCC) 04/13/2023   Coping style affecting medical condition 03/27/2023   Thoracic myelopathy with LE weakness 03/23/2023   HNP (herniated nucleus pulposus with myelopathy), thoracic 03/12/2023   Hyperlipidemia 12/24/2022   Failure to thrive  in adult 12/24/2022   Tobacco abuse 03/18/2018   BPH associated with nocturia 03/11/2017   History of substance abuse (HCC) 03/11/2017   Restless leg syndrome 03/11/2017   Restrictive lung disease 05/14/2016   OSA on CPAP 05/14/2016   Moderate COPD (chronic obstructive pulmonary disease) (HCC) 05/14/2016   Smoking greater than 40 pack years 05/14/2016   SOB (shortness of  breath) 10/06/2014   Edema of extremities 10/06/2014   Benign essential HTN 10/06/2014   Type 2 diabetes mellitus, without long-term current use of insulin  (HCC) 07/18/2011    PCP: Arlon Bergamo, MD  REFERRING PROVIDER:  Laverle Postin, MD   REFERRING DIAG: [phone] (ICD-10-CM) - Other spondylosis with myelopathy, thoracic region   THERAPY DIAG:  Other abnormalities of gait and mobility  Muscle weakness (generalized)  Chronic pain of right knee  Chronic pain of left knee  Rationale for Evaluation and Treatment: Rehabilitation  ONSET DATE: October 2024  SUBJECTIVE:                                                                                                                                                                                                          SUBJECTIVE STATEMENT:  Pt reports he did not keep his PT appt at [facility] last week - is still going to [employer] and riding the bike which he thinks is helping a lot; continues to stand at home at his deck  Hand dominance: Right  PERTINENT HISTORY:  Broken R ankle x2; history of COPD, T2DM, OSA, tobacco use, CKD 111a, morbid obesity who was admitted on 03/11/2023 with BLE weakness with decreased coordination and gait disorder as well as reports of urinary incontinence. He was found to have large HNP T10/T11 and underwent T10 laminectomy with discectomy by Dr. Michale Age on 03/12/2023   PAIN:  Are you having pain? Yes: NPRS scale: 3/10 Pain location: toothache  Pain description: throbbing  Aggravating factors: nothing in PT  Relieving factors: nothing in PT    PRECAUTIONS: None  RED FLAGS: None     WEIGHT BEARING RESTRICTIONS: No  FALLS:  Has patient fallen in last 6 months? Yes. Number of falls 1 -- last fall Oct 2024  LIVING ENVIRONMENT: Lives with: lives with their spouse Lives in: House/apartment Stairs: ramp Has following equipment at home: Environmental consultant - 2 wheeled, Wheelchair (manual), and upright walker, hospital bed  OCCUPATION: Retired  PLOF: Able to do his own ADLs but easier to have wife assist him  PATIENT GOALS: Walk with the walker at least household distances  NEXT MD VISIT: 04/21/23 [place]  OBJECTIVE:  Note: Objective measures were completed at Evaluation unless otherwise noted.  DIAGNOSTIC FINDINGS:  03/12/23 MRI THORACIC SPINE IMPRESSION:   1. Moderate-sized lobulated left subarticular to foraminal disc protrusion at T10-11 with resultant severe spinal stenosis. Cord is compressed and deviated to the right at this level. Associated cord signal changes concerning for edema  and/or myelomalacia. 2. No other  acute abnormality within the thoracic spine. 3. Degenerative spondylosis at T8-9 through T11-12 without significant spinal stenosis. Associated moderate to severe bilateral foraminal narrowing at these levels as above.  MRI LUMBAR SPINE IMPRESSION:   1. No acute abnormality within the lumbar spine. 2. Multifactorial degenerative changes at L4-5 with resultant mild canal with severe left and moderate right lateral recess stenosis, with severe bilateral L4 foraminal narrowing. 3. Additional mild noncompressive disc bulging and facet hypertrophy elsewhere within the lumbar spine as above. No other significant stenosis or frank neural impingement.   Critical Value/emergent results were called by telephone at the time of interpretation on 03/12/2023 at 12:41 am to provider Dr. Monique Ano, who verbally acknowledged these results.  PATIENT SURVEYS:  FOTO 42; predicted 54; 06/15/23- 27; 07/15/23- 37   COGNITION: Overall cognitive status: Within functional limits for tasks assessed  SENSATION: WFL  POSTURE: rounded shoulders and forward head  PALPATION: No overt tenderness to palpation   CERVICAL ROM: WNL  LOWER EXTREMITY MMT:    MMT Right eval Left eval Right 07/15/23 Left 07/15/23 Right 08/17/23 Left  08/17/23  Hip flexion 3+ 3 4+ 3 4 4+  Hip extension 3+ 3+      Hip abduction 3+ 3+ 4 4 4 4   Hip adduction        Hip internal rotation        Hip external rotation        Knee flexion 4- 3+   3+ 3+  Knee extension 4- 3+ 4+ 4+ 3+ 4+  Ankle dorsiflexion   4+ 4+ 4+ 4  Ankle plantarflexion        Ankle inversion        Ankle eversion         (Blank rows = not tested)   FUNCTIONAL TESTS:  Transfers:  Chair to bed: Multiple seated scoots with UEs  Sit<>stand: max A -- limited due to knee pain Supine to sit<>sit: min A for trunk and LE negotiation. Not performing log rolling  TODAY'S TREATMENT:                                                                                                                               DATE: 11-04-23  Aquatic PT at [facility] - pool temp 90 degrees: Terri Fester, PT, assisted with treatment session  Patient seen for aquatic therapy today.  Treatment took place in water 3.6-4.5 feet deep depending upon activity.  Pt entered and exited the pool via chair lift. Pt transferred to/from wheelchair to chair lift with use of sliding board with CGA to min assist for LE positioning when exiting pool.  Terri Fester, PT, assisted with treatment.  Pt performed static standing at pool wall with cues for knee extension as able/tolerated; approx. 60 secs with UE support on pool edge  Gait training with water walker approx. 10 laps (18' x 20 ) with +1 CGA/min assist - 2nd PT standing by but not needing  to assist with balance;  occasional spasticity/flexor tone  Sidestepping with water walker 18' x 2 reps; backwards ambulation 18' x 2 reps with water walker with CGA to min assist  Squats x 10 reps - holding onto water walker  Seated exercise on pool bench - 10 reps partial sit to stand with min to mod assist  Seated bil. LE LAQ 10 reps   Marching in place - with bil. UE support on pool edge approx. 10 reps  Pt requires buoyancy of water for support for reduced fall risk and for unloading/reduced stress on joints (B knees) as pt able to tolerate increased standing and ambulation in water compared to that on land; viscosity of water is needed for resistance for strengthening and current of water provides perturbations for challenge for balance training           PATIENT EDUCATION:  Education details: aquatic rationale Person educated: Patient Education method: Programmer, multimedia, Demonstration, and Handouts Education comprehension: verbalized understanding, returned demonstration, and needs further education  HOME EXERCISE PROGRAM: Access Code: [email or web address] URL: [email or web address] Date:  04/21/2023 Prepared by: Gellen April Marie Nonato  Exercises - Staggered Bridge  - 1 x daily - 7 x weekly - 2 sets - 10 reps - Hooklying Isometric Clamshell  - 1 x daily - 7 x weekly - 2 sets - 10 reps - Small Range Straight Leg Raise  - 1 x daily - 7 x weekly - 2 sets - 10 reps  ASSESSMENT:  CLINICAL IMPRESSION:  Aquatic PT session focused primarily on water walking in various directions as pt able to use water walker and stand significantly more upright than he has been able to do in any previous aquatic PT session.  Pt able to stand with bil. UE support on edge of pool with SBA for approx. 1" - longest duration to date in aquatic PT.  Pt is progressing well.  Cont with POC.    OBJECTIVE IMPAIRMENTS: Abnormal gait, decreased balance, decreased endurance, decreased mobility, difficulty walking, decreased ROM, decreased strength, increased edema, improper body mechanics, postural dysfunction, obesity, and pain.   GOALS: Goals reviewed with patient? Yes  SHORT TERM GOALS: Target date: 09/07/2023       Pt will be ind with initial HEP Baseline:  Goal status: 05/06/23- provided, but inconsistent in performing. Ongoing 07/15/23  2.  Pt will be able to tolerate standing x 1 min in the parallel bars with BUE support and CGA Baseline: 06/01/23- Goal updated after recent hospitalization Goal status: 07/15/23 unable to assess due to elevated BP  3.  Pt will be able to perform sliding board transfer with S, including set up, and up a slight (3") grade Baseline: 06/01/23- Goal updated after recent hospitalization Goal status:  07/15/23 PARTIALLY MET    LONG TERM GOALS: Target date: 09/28/2023       Pt will be ind with management and progression of HEP Baseline:  Goal status: ONGOING 07/15/23; Ongoing 10-05-23  2.  Pt will be able to perform stand-pivot transfers with RW and no more than MinA  Baseline:  Goal status: GOAL MODIFIED  07/15/23; Not met 10-05-23  3.  Pt will be able to  perform at least 2 reps of sit to stand in 60 sec with UE support Baseline: Unable to perform complete STS without mod A Goal status: GOAL MODIFIED 07/15/23; Goal not met 10-05-23  4.  Pt will have increased FOTO to >/=54 Baseline:  Goal status: ONGOING 07/15/23; Deferred 10-05-23   NEW Short  term goals:  Target date 10-30-23 = 4 weeks    1.  Pt will amb. 2 laps across width of pool (18' x 4 reps) with water walker with +1 min assist.    Baseline:     Goal status:  NEW    2.  Pt will report ability to stand at home with UE support on counter or at deck railing with SBA for at least 1" for LE weight bearing for tone management.           Baseline:           Goal status:  NEW  NEW Long term goals:  Target date 11-27-23 = 8 weeks    1.  Independent in HEP for aquatic exercises to be continued after D/C from PT.    Baseline:    Goal status:  Ongoing    2.  Pt will ambulate 3 laps (18' x 6 reps) across width of pool with large barbell with mod assist +1.    Baseline;    Goal status:  NEW   PLAN:  PT FREQUENCY: 1x/week  PT DURATION: 8 weeks  PLANNED INTERVENTIONS: 97164- PT Re-evaluation, 97110-Therapeutic exercises, 97530- Therapeutic activity, [id number]- Neuromuscular re-education, 97535- Self Care, 16109- Manual therapy, [phone]- Gait training, [phone]- Orthotic Fit/training, [phone]- Aquatic Therapy, [phone]- Electrical stimulation (unattended), [id number]- Ultrasound, 13086- Ionotophoresis 4mg /ml Dexamethasone , Patient/Family education, Balance training, Stair training, Taping, Dry Needling, Joint mobilization, Spinal mobilization, Cryotherapy, and Moist heat  PLAN FOR NEXT SESSION: focus on water PT for maximal functional gains  Barb Bonito, PT 11/06/23 4:26 PM

## 2023-11-10 ENCOUNTER — Other Ambulatory Visit: Payer: Self-pay | Admitting: Bariatrics

## 2023-11-11 ENCOUNTER — Ambulatory Visit: Admitting: Physical Therapy

## 2023-11-11 DIAGNOSIS — G8929 Other chronic pain: Secondary | ICD-10-CM

## 2023-11-11 DIAGNOSIS — R2689 Other abnormalities of gait and mobility: Secondary | ICD-10-CM

## 2023-11-11 DIAGNOSIS — M6281 Muscle weakness (generalized): Secondary | ICD-10-CM

## 2023-11-13 ENCOUNTER — Encounter: Payer: Self-pay | Admitting: Physical Therapy

## 2023-11-13 NOTE — Therapy (Signed)
 OUTPATIENT PHYSICAL THERAPY THORACIC/AQUATIC THERAPY NOTE     Patient Name: Raymond [name] MRN: [id number] DOB:[birth year], 58 y.o., male Today's Date: 11/13/2023  END OF SESSION:  PT End of Session - 11/13/23 1713     Visit Number 23    Number of Visits 27    Date for PT Re-Evaluation 12/11/23    Authorization Type [employer] Managed    PT Start Time 1310    PT Stop Time 1355    PT Time Calculation (min) 45 min    Equipment Utilized During Treatment Other (comment)   water walker, noodle, single barbell   Activity Tolerance Patient tolerated treatment well    Behavior During Therapy WFL for tasks assessed/performed            Past Medical History:  Diagnosis Date   Acute kidney injury superimposed on chronic kidney disease (HCC) 04/13/2023   Acute on chronic anemia 04/13/2023   Anemia    Anxiety    Back pain    COPD (chronic obstructive pulmonary disease) (HCC)    Depression    Diabetes mellitus without complication (HCC)    Diverticulosis    with hx of LGIB   Edema    GERD (gastroesophageal reflux disease)    GIB (gastrointestinal bleeding)    Gout    Heavy smoker    Hyperlipidemia    Hypertension    Iron deficiency anemia    Sleep apnea    uses a cpap   Swelling of lower extremity    Past Surgical History:  Procedure Laterality Date   CHONDROPLASTY Left 06/28/2014   Procedure: CHONDROPLASTY;  Surgeon: Forbes Ida., MD;  Location: [facility];  Service: Orthopedics;  Laterality: Left;   COLONOSCOPY     FOOT FASCIOTOMY     age 29-rt   KNEE ARTHROSCOPY WITH LATERAL MENISECTOMY Left 06/28/2014   Procedure: KNEE ARTHROSCOPY WITH LATERAL MENISECTOMY;  Surgeon: Forbes Ida., MD;  Location: [facility];  Service: Orthopedics;  Laterality: Left;   KNEE ARTHROSCOPY WITH MEDIAL MENISECTOMY Left 06/28/2014   Procedure: LEFT KNEE ARTHROSCOPY CHONDROPLASTY/WITH MEDIAL/LATERAL MENISECTOMIES;  Surgeon: Forbes Ida., MD;  Location: [facility];  Service: Orthopedics;  Laterality: Left;   ORIF RADIUS & ULNA FRACTURES  2007   left   THORACIC DISCECTOMY N/A 03/12/2023   Procedure: THORACIC LAMINECTOMY AND  DISCECTOMY;  Surgeon: Audie Bleacher, MD;  Location[facility];  Service: Neurosurgery;  Laterality: N/A;   Patient Active Problem List   Diagnosis Date Noted   Chronic incomplete paraplegia (HCC) 10/19/2023   Spasticity 10/19/2023   Wheelchair dependence 10/19/2023   Cystitis 05/21/2023   Acute diverticulitis 05/21/2023   Vitamin D  deficiency 05/14/2023   Low HDL (under 40) 05/14/2023   GERD (gastroesophageal reflux disease) 04/13/2023   Constipation 04/13/2023   Chronic back pain 04/13/2023   DVT, bilateral lower limbs (HCC) 04/13/2023   Coping style affecting medical condition 03/27/2023   Thoracic myelopathy with LE weakness 03/23/2023   HNP (herniated nucleus pulposus with myelopathy), thoracic 03/12/2023   Hyperlipidemia 12/24/2022   Failure to thrive  in adult 12/24/2022   Tobacco abuse 03/18/2018   BPH associated with nocturia 03/11/2017   History of substance abuse (HCC) 03/11/2017   Restless leg syndrome 03/11/2017   Restrictive lung disease 05/14/2016   OSA on CPAP 05/14/2016   Moderate COPD (chronic obstructive pulmonary disease) (HCC) 05/14/2016   Smoking greater than 40 pack years 05/14/2016   SOB (shortness of  breath) 10/06/2014   Edema of extremities 10/06/2014   Benign essential HTN 10/06/2014   Type 2 diabetes mellitus, without long-term current use of insulin  (HCC) 07/18/2011    PCP: Arlon Bergamo, MD  REFERRING PROVIDER:  Laverle Postin, MD   REFERRING DIAG: [phone] (ICD-10-CM) - Other spondylosis with myelopathy, thoracic region   THERAPY DIAG:  Other abnormalities of gait and mobility  Muscle weakness (generalized)  Chronic pain of right knee  Chronic pain of left knee  Rationale for Evaluation and Treatment: Rehabilitation  ONSET DATE: October  2024  SUBJECTIVE:                                                                                                                                                                                                         SUBJECTIVE STATEMENT:  Pt reports he is still going to land PT at [facility] - states he stood with his walker in therapy this week.  Pt and wife inquire about places in community with pool - informed them about [facility] - this was mentioned in previous aquatic PT session last week   Hand dominance: Right  PERTINENT HISTORY:  Broken R ankle x2; history of COPD, T2DM, OSA, tobacco use, CKD 111a, morbid obesity who was admitted on 03/11/2023 with BLE weakness with decreased coordination and gait disorder as well as reports of urinary incontinence. He was found to have large HNP T10/T11 and underwent T10 laminectomy with discectomy by Dr. Michale Age on 03/12/2023   PAIN:  Are you having pain? Yes: NPRS scale: 3/10 Pain location: toothache  Pain description: throbbing  Aggravating factors: nothing in PT  Relieving factors: nothing in PT    PRECAUTIONS: None  RED FLAGS: None     WEIGHT BEARING RESTRICTIONS: No  FALLS:  Has patient fallen in last 6 months? Yes. Number of falls 1 -- last fall Oct 2024  LIVING ENVIRONMENT: Lives with: lives with their spouse Lives in: House/apartment Stairs: ramp Has following equipment at home: Environmental consultant - 2 wheeled, Wheelchair (manual), and upright walker, hospital bed  OCCUPATION: Retired  PLOF: Able to do his own ADLs but easier to have wife assist him  PATIENT GOALS: Walk with the walker at least household distances  NEXT MD VISIT: 04/21/23 [place]  OBJECTIVE:  Note: Objective measures were completed at Evaluation unless otherwise noted.  DIAGNOSTIC FINDINGS:  03/12/23 MRI THORACIC SPINE IMPRESSION:   1. Moderate-sized lobulated left subarticular to foraminal disc protrusion at T10-11 with resultant severe spinal stenosis. Cord  is compressed and deviated to the right at this level. Associated  cord signal changes concerning for edema and/or myelomalacia. 2. No other acute abnormality within the thoracic spine. 3. Degenerative spondylosis at T8-9 through T11-12 without significant spinal stenosis. Associated moderate to severe bilateral foraminal narrowing at these levels as above.  MRI LUMBAR SPINE IMPRESSION:   1. No acute abnormality within the lumbar spine. 2. Multifactorial degenerative changes at L4-5 with resultant mild canal with severe left and moderate right lateral recess stenosis, with severe bilateral L4 foraminal narrowing. 3. Additional mild noncompressive disc bulging and facet hypertrophy elsewhere within the lumbar spine as above. No other significant stenosis or frank neural impingement.   Critical Value/emergent results were called by telephone at the time of interpretation on 03/12/2023 at 12:41 am to provider Dr. Monique Ano, who verbally acknowledged these results.  PATIENT SURVEYS:  FOTO 42; predicted 54; 06/15/23- 27; 07/15/23- 37   COGNITION: Overall cognitive status: Within functional limits for tasks assessed  SENSATION: WFL  POSTURE: rounded shoulders and forward head  PALPATION: No overt tenderness to palpation   CERVICAL ROM: WNL  LOWER EXTREMITY MMT:    MMT Right eval Left eval Right 07/15/23 Left 07/15/23 Right 08/17/23 Left  08/17/23  Hip flexion 3+ 3 4+ 3 4 4+  Hip extension 3+ 3+      Hip abduction 3+ 3+ 4 4 4 4   Hip adduction        Hip internal rotation        Hip external rotation        Knee flexion 4- 3+   3+ 3+  Knee extension 4- 3+ 4+ 4+ 3+ 4+  Ankle dorsiflexion   4+ 4+ 4+ 4  Ankle plantarflexion        Ankle inversion        Ankle eversion         (Blank rows = not tested)   FUNCTIONAL TESTS:  Transfers:  Chair to bed: Multiple seated scoots with UEs  Sit<>stand: max A -- limited due to knee pain Supine to sit<>sit: min A for trunk and LE  negotiation. Not performing log rolling  TODAY'S TREATMENT:                                                                                                                              DATE: 11-11-23  Aquatic PT at [facility] - pool temp 90 degrees  Patient seen for aquatic therapy today.  Treatment took place in water 3.6-4.5 feet deep depending upon activity.  Pt entered and exited the pool via chair lift. Pt transferred to/from wheelchair to chair lift with use of sliding board with CGA to min assist for LE positioning when exiting pool.   Pt performed static standing at pool wall with cues for knee extension - bil. UE support on pool edge  Gait training with water walker approx. 10 laps (18' x 20 ) with CGA to SBA Sidestepping with water walker 18' x 2 reps; backwards ambulation 18' x 2 reps  with water walker with CGA   Squats x 10 reps - holding onto water walker  Seated exercise on pool bench - 10 reps partial sit to stand with min to mod assist  Water walking performed with use of yellow noodle and with single large barbell for floatation and support - min assist to CGA- pt amb. Approx. 5 laps across width of pool with this set up   Marching in place - with bil. UE support on pool edge approx. 10 reps  Pt requires buoyancy of water for support for reduced fall risk and for unloading/reduced stress on joints (B knees) as pt able to tolerate increased standing and ambulation in water compared to that on land; viscosity of water is needed for resistance for strengthening and current of water provides perturbations for challenge for balance training           PATIENT EDUCATION:  Education details: aquatic exercises Person educated: Patient Education method: Programmer, multimedia, Demonstration, and Handouts Education comprehension: verbalized understanding, returned demonstration, and needs further education  HOME EXERCISE PROGRAM: Access Code: [email or web address] URL:  [email or web address] Date: 04/21/2023 Prepared by: Gellen April Marie Nonato  Exercises - Staggered Bridge  - 1 x daily - 7 x weekly - 2 sets - 10 reps - Hooklying Isometric Clamshell  - 1 x daily - 7 x weekly - 2 sets - 10 reps - Small Range Straight Leg Raise  - 1 x daily - 7 x weekly - 2 sets - 10 reps  ASSESSMENT:  CLINICAL IMPRESSION:  Aquatic PT session focused on water walking in various directions as pt able to use water walker initially and progress to use of large noodle and single barbell for floatation and support with min to CGA.  Pt able to stand with most erect posture that he has demonstrated in any aquatic PT session to date.  Pt is progressing very well with gait and balance in the pool.  Minimal spasms noted to occur in LE's in today's session.  Cont with POC.    OBJECTIVE IMPAIRMENTS: Abnormal gait, decreased balance, decreased endurance, decreased mobility, difficulty walking, decreased ROM, decreased strength, increased edema, improper body mechanics, postural dysfunction, obesity, and pain.   GOALS: Goals reviewed with patient? Yes  SHORT TERM GOALS: Target date: 09/07/2023       Pt will be ind with initial HEP Baseline:  Goal status: 05/06/23- provided, but inconsistent in performing. Ongoing 07/15/23  2.  Pt will be able to tolerate standing x 1 min in the parallel bars with BUE support and CGA Baseline: 06/01/23- Goal updated after recent hospitalization Goal status: 07/15/23 unable to assess due to elevated BP  3.  Pt will be able to perform sliding board transfer with S, including set up, and up a slight (3) grade Baseline: 06/01/23- Goal updated after recent hospitalization Goal status:  07/15/23 PARTIALLY MET    LONG TERM GOALS: Target date: 09/28/2023       Pt will be ind with management and progression of HEP Baseline:  Goal status: ONGOING 07/15/23; Ongoing 10-05-23  2.  Pt will be able to perform stand-pivot transfers with  RW and no more than MinA  Baseline:  Goal status: GOAL MODIFIED  07/15/23; Not met 10-05-23  3.  Pt will be able to perform at least 2 reps of sit to stand in 60 sec with UE support Baseline: Unable to perform complete STS without mod A Goal status: GOAL MODIFIED 07/15/23; Goal not met 10-05-23  4.  Pt will  have increased FOTO to >/=54 Baseline:  Goal status: ONGOING 07/15/23; Deferred 10-05-23   NEW Short term goals:  Target date 10-30-23 = 4 weeks    1.  Pt will amb. 2 laps across width of pool (18' x 4 reps) with water walker with +1 min assist.    Baseline:     Goal status:  NEW    2.  Pt will report ability to stand at home with UE support on counter or at deck railing with SBA for at least 1 for LE weight bearing for tone management.           Baseline:           Goal status:  NEW  NEW Long term goals:  Target date 11-27-23 = 8 weeks    1.  Independent in HEP for aquatic exercises to be continued after D/C from PT.    Baseline:    Goal status:  Ongoing    2.  Pt will ambulate 3 laps (18' x 6 reps) across width of pool with large barbell with mod assist +1.    Baseline;    Goal status:  NEW   PLAN:  PT FREQUENCY: 1x/week  PT DURATION: 8 weeks  PLANNED INTERVENTIONS: 97164- PT Re-evaluation, 97110-Therapeutic exercises, 97530- Therapeutic activity, 97112- Neuromuscular re-education, 97535- Self Care, 16109- Manual therapy, [phone]- Gait training, [phone]- Orthotic Fit/training, [phone]- Aquatic Therapy, [phone]- Electrical stimulation (unattended), [id number]- Ultrasound, 13086- Ionotophoresis 4mg /ml Dexamethasone , Patient/Family education, Balance training, Stair training, Taping, Dry Needling, Joint mobilization, Spinal mobilization, Cryotherapy, and Moist heat  PLAN FOR NEXT SESSION: focus on water PT for maximal functional gains  Barb Bonito, PT 11/13/23 5:16 PM

## 2023-11-17 ENCOUNTER — Ambulatory Visit: Admitting: Bariatrics

## 2023-11-18 ENCOUNTER — Encounter: Payer: Self-pay | Admitting: Bariatrics

## 2023-11-18 ENCOUNTER — Ambulatory Visit (INDEPENDENT_AMBULATORY_CARE_PROVIDER_SITE_OTHER): Admitting: Bariatrics

## 2023-11-18 VITALS — BP 129/79 | HR 77 | Temp 98.3°F | Ht 71.0 in | Wt 315.0 lb

## 2023-11-18 DIAGNOSIS — E1122 Type 2 diabetes mellitus with diabetic chronic kidney disease: Secondary | ICD-10-CM

## 2023-11-18 DIAGNOSIS — E119 Type 2 diabetes mellitus without complications: Secondary | ICD-10-CM

## 2023-11-18 DIAGNOSIS — I10 Essential (primary) hypertension: Secondary | ICD-10-CM

## 2023-11-18 DIAGNOSIS — E661 Drug-induced obesity: Secondary | ICD-10-CM

## 2023-11-18 DIAGNOSIS — Z6841 Body Mass Index (BMI) 40.0 and over, adult: Secondary | ICD-10-CM

## 2023-11-18 DIAGNOSIS — Z7985 Long-term (current) use of injectable non-insulin antidiabetic drugs: Secondary | ICD-10-CM

## 2023-11-18 MED ORDER — ONDANSETRON HCL 4 MG PO TABS: 4.0000 mg | ORAL_TABLET | Freq: Three times a day (TID) | ORAL | 0 refills | Status: DC | PRN

## 2023-11-18 NOTE — Progress Notes (Signed)
 WEIGHT SUMMARY AND BIOMETRICS  Weight Lost Since Last Visit: 7lb  Weight Gained Since Last Visit: 0lb   Vitals Temp: 98.3 F (36.8 C) BP: 129/79 Pulse Rate: 77 SpO2: 96 %   Anthropometric Measurements Height: 5' 11 (1.803 m) Weight: (!) 315 lb (142.9 kg) BMI (Calculated): 43.95 Weight at Last Visit: 322lb Weight Lost Since Last Visit: 7lb Weight Gained Since Last Visit: 0lb Starting Weight: 326lb Total Weight Loss (lbs): 0 lb (0 kg)   No data recorded Other Clinical Data Fasting: no Labs: no Today's Visit #: 8 Starting Date: 05/13/23    OBESITY Raymond [name] is here to discuss his progress with his obesity treatment plan along with follow-up of his obesity related diagnoses.    Nutrition Plan: the Category 4 plan - 70% adherence.  Current exercise: Chair exercise and physical therapy  Interim History:  He is down 7 lbs pounds since his last visit.  He has not been taking his fluid pill on a regular basis.  This has happened before when he does not take his fluid pill.  He has a history of type 2 diabetes, restrictive lung disease, diminished kidney function, and signs and symptoms of congestive heart failure without a diagnosis on his chart.  Eating all of the food on the plan., Protein intake is as prescribed, Is exceeding snack calorie allotment, Is not skipping meals, Not journaling consistently., and Water intake is adequate.   Pharmacotherapy: Zaahir is on Mounjaro  12.5 mg SQ weekly Adverse side effects: None Hunger is moderately controlled.  Cravings are moderately controlled.  Assessment/Plan:   Type II Diabetes HgbA1c is at goal. Last A1c was 6.0 CBGs: Fasting 120 to 130       Episodes of hypoglycemia: no Medication(s): Mounjaro  12.5 mg SQ weekly  Lab Results  Component Value Date   HGBA1C 6.0 (H) 03/12/2023   HGBA1C 6.3 (H) 12/25/2022    Lab Results  Component Value Date   LDLCALC 77 05/13/2023   CREATININE 1.87 (H) 05/25/2023   Lab Results  Component Value Date   GFR 92.19 10/05/2014    Plan: Continue Mounjaro  15 mg SQ weekly Continue all other medications.  Will keep all carbohydrates low both sweets and starches.  Will continue exercise regimen to 30 to 60 minutes on most days of the week.  Aim for 7 to 9 hours of sleep nightly.  Eat more low glycemic index foods.    Hypertension Hypertension well controlled.  Medication(s): Amlodipine  10 mg 1 daily  and Coreg  25 mg twice daily , Cardura  2 mg, Hydralazine  25 mg, and Losartan  50 mg, Diovan  -HCT 80-12.5 mg  BP Readings from Last 3 Encounters:  11/18/23 129/79  10/19/23 121/69  10/15/23 (!) 146/77   Lab Results  Component Value Date   CREATININE 1.87 (H) 05/25/2023   CREATININE 1.85 (H) 05/24/2023   CREATININE 1.59 (H) 05/23/2023  Lab Results  Component Value Date   GFR 92.19 10/05/2014   Exam: No labored breathing, lung sounds without adventitious sounds, able to speak in complete sentences,  He did have 1-2+ edema in his lower legs.  Plan: Continue all antihypertensives at current dosages. No added salt. Will keep sodium content to 1,500 mg or less per day. ; He will continue to check his blood pressures at home and will bring his blood pressure readings are high.   He will weigh his self daily and will call his primary care if his fluid levels exceed 3 pounds of weight daily. He will keep his water intake at 64 ounces or less.   Morbid Obesity: Current BMI BMI (Calculated): 43.95   Pharmacotherapy Plan Continue  Mounjaro  12.5 mg SQ weekly  Marin is currently in the action stage of change. As such, his goal is to continue with weight loss efforts.  He has agreed to the Category 4 plan.  Exercise goals: All adults should avoid inactivity. Some physical activity is better than none, and adults who participate in any amount of physical  activity gain some health benefits. He is continuing his therapy twice a week doing both water aerobics (stopped secondary to removal of toe and will resume when his area heals).   Behavioral modification strategies: increasing lean protein intake, no meal skipping, meal planning , planning for success, increasing vegetables, keep healthy foods in the home, and weigh protein portions.  Draydon has agreed to follow-up with our clinic in 4 weeks.      Objective:   VITALS: Per patient if applicable, see vitals. GENERAL: Alert and in no acute distress. CARDIOPULMONARY: No increased WOB. Speaking in clear sentences.  PSYCH: Pleasant and cooperative. Speech normal rate and rhythm. Affect is appropriate. Insight and judgement are appropriate. Attention is focused, linear, and appropriate.  NEURO: Oriented as arrived to appointment on time with no prompting.   Attestation Statements:   This was prepared with the assistance of Engineer, civil (consulting).  Occasional wrong-word or sound-a-like substitutions Varnadore have occurred due to the inherent limitations of voice recognition  Kirk Peper, DO

## 2023-11-25 ENCOUNTER — Ambulatory Visit: Admitting: Physical Therapy

## 2023-11-27 ENCOUNTER — Ambulatory Visit: Admitting: Physical Medicine and Rehabilitation

## 2023-11-28 ENCOUNTER — Other Ambulatory Visit (HOSPITAL_COMMUNITY): Payer: Self-pay

## 2023-11-30 ENCOUNTER — Encounter: Attending: Physical Medicine and Rehabilitation | Admitting: Registered Nurse

## 2023-11-30 ENCOUNTER — Encounter: Payer: Self-pay | Admitting: Registered Nurse

## 2023-11-30 VITALS — BP 153/89 | HR 82 | Ht 71.0 in | Wt 315.0 lb

## 2023-11-30 DIAGNOSIS — M545 Low back pain, unspecified: Secondary | ICD-10-CM | POA: Diagnosis present

## 2023-11-30 DIAGNOSIS — R252 Cramp and spasm: Secondary | ICD-10-CM | POA: Insufficient documentation

## 2023-11-30 DIAGNOSIS — M4714 Other spondylosis with myelopathy, thoracic region: Secondary | ICD-10-CM | POA: Insufficient documentation

## 2023-11-30 DIAGNOSIS — G8929 Other chronic pain: Secondary | ICD-10-CM | POA: Insufficient documentation

## 2023-11-30 DIAGNOSIS — Z79891 Long term (current) use of opiate analgesic: Secondary | ICD-10-CM | POA: Insufficient documentation

## 2023-11-30 DIAGNOSIS — G894 Chronic pain syndrome: Secondary | ICD-10-CM | POA: Insufficient documentation

## 2023-11-30 DIAGNOSIS — G8222 Paraplegia, incomplete: Secondary | ICD-10-CM | POA: Insufficient documentation

## 2023-11-30 DIAGNOSIS — Z5181 Encounter for therapeutic drug level monitoring: Secondary | ICD-10-CM | POA: Insufficient documentation

## 2023-11-30 DIAGNOSIS — Z993 Dependence on wheelchair: Secondary | ICD-10-CM | POA: Insufficient documentation

## 2023-11-30 DIAGNOSIS — M542 Cervicalgia: Secondary | ICD-10-CM | POA: Insufficient documentation

## 2023-11-30 DIAGNOSIS — M25511 Pain in right shoulder: Secondary | ICD-10-CM

## 2023-11-30 MED ORDER — OXYCODONE-ACETAMINOPHEN 10-325 MG PO TABS: 1.0000 | ORAL_TABLET | Freq: Four times a day (QID) | ORAL | 0 refills | Status: DC | PRN

## 2023-11-30 NOTE — Progress Notes (Signed)
 Subjective:    Patient ID: Raymond [name], male    DOB: 1965-07-07, 58 y.o.   MRN: [id number]  HPI: Raymond [name] is a 58 y.o. male who returns for follow up appointment for chronic pain and medication refill. He states his pain is located in his neck, right shouder, mid- lower back, bilateral hips and bilateral knee pain. He rates his pain 3. His current exercise regime is physical therapy two days a week, pool therapy weekly.  [name] Morphine  equivalent is 60.00 MME. He  is also prescribed Alprazolam  by Dr. Marsa  .We have discussed the black box warning of using opioids and benzodiazepines. I highlighted the dangers of using these drugs together and discussed the adverse events including respiratory suppression, overdose, cognitive impairment and importance of compliance with current regimen. We will continue to monitor and adjust as indicated.     Oral Swab was Performed today     Pain Inventory Average Pain 5 Pain Right Now 3 My pain is sharp, dull, and tingling  In the last 24 hours, has pain interfered with the following? General activity 4 Relation with others 6 Enjoyment of life 8 What TIME of day is your pain at its worst? morning  and night Sleep (in general) Fair  Pain is worse with: bending, sitting, and some activites Pain improves with: rest, heat/ice, medication, and injections Relief from Meds: na  Family History  Problem Relation Age of Onset   Diabetes Mother    Hypertension Mother    High Cholesterol Mother    Heart disease Mother    Depression Mother    Diabetes Father    Hypertension Father    High Cholesterol Father    Heart disease Father    Cancer Other    Social History   Socioeconomic History   Marital status: Single    Spouse name: Not on file   Number of children: Not on file   Years of education: Not on file   Highest education level: Not on file  Occupational History   Not on file  Tobacco Use   Smoking status: Former    Current  packs/day: 2.00    Types: Cigarettes   Smokeless tobacco: Never  Vaping Use   Vaping status: Never Used  Substance and Sexual Activity   Alcohol use: Yes    Comment: rare   Drug use: No   Sexual activity: Not on file  Other Topics Concern   Not on file  Social History Narrative   Not on file   Social Drivers of Health   Financial Resource Strain: Not on file  Food Insecurity: No Food Insecurity (05/21/2023)   Hunger Vital Sign    Worried About Running Out of Food in the Last Year: Never true    Ran Out of Food in the Last Year: Never true  Transportation Needs: No Transportation Needs (05/21/2023)   PRAPARE - Administrator, Civil Service (Medical): No    Lack of Transportation (Non-Medical): No  Physical Activity: Not on file  Stress: No Stress Concern Present (03/21/2022)   Received from [facility] - Occupational Stress Questionnaire    Feeling of Stress : Not at all  Social Connections: Unknown (01/10/2022)   Received from [facility]   Social Network    Social Network: Not on file   Past Surgical History:  Procedure Laterality Date   CHONDROPLASTY Left 06/28/2014   Procedure: CHONDROPLASTY;  Surgeon: LELON JONETTA Shari Mickey., MD;  Location: [facility];  Service: Orthopedics;  Laterality: Left;   COLONOSCOPY     FOOT FASCIOTOMY     age 75-rt   KNEE ARTHROSCOPY WITH LATERAL MENISECTOMY Left 06/28/2014   Procedure: KNEE ARTHROSCOPY WITH LATERAL MENISECTOMY;  Surgeon: LELON JONETTA Shari Mickey., MD;  Location: [facility];  Service: Orthopedics;  Laterality: Left;   KNEE ARTHROSCOPY WITH MEDIAL MENISECTOMY Left 06/28/2014   Procedure: LEFT KNEE ARTHROSCOPY CHONDROPLASTY/WITH MEDIAL/LATERAL MENISECTOMIES;  Surgeon: LELON JONETTA Shari Mickey., MD;  Location: [facility];  Service: Orthopedics;  Laterality: Left;   ORIF RADIUS & ULNA FRACTURES  2007   left   THORACIC DISCECTOMY N/A 03/12/2023    Procedure: THORACIC LAMINECTOMY AND  DISCECTOMY;  Surgeon: Gillie Duncans, MD;  Location: [facility];  Service: Neurosurgery;  Laterality: N/A;   Past Surgical History:  Procedure Laterality Date   CHONDROPLASTY Left 06/28/2014   Procedure: CHONDROPLASTY;  Surgeon: LELON JONETTA Shari Mickey., MD;  Location: [facility];  Service: Orthopedics;  Laterality: Left;   COLONOSCOPY     FOOT FASCIOTOMY     age 75-rt   KNEE ARTHROSCOPY WITH LATERAL MENISECTOMY Left 06/28/2014   Procedure: KNEE ARTHROSCOPY WITH LATERAL MENISECTOMY;  Surgeon: LELON JONETTA Shari Mickey., MD;  Location: [facility];  Service: Orthopedics;  Laterality: Left;   KNEE ARTHROSCOPY WITH MEDIAL MENISECTOMY Left 06/28/2014   Procedure: LEFT KNEE ARTHROSCOPY CHONDROPLASTY/WITH MEDIAL/LATERAL MENISECTOMIES;  Surgeon: LELON JONETTA Shari Mickey., MD;  Location: [facility];  Service: Orthopedics;  Laterality: Left;   ORIF RADIUS & ULNA FRACTURES  2007   left   THORACIC DISCECTOMY N/A 03/12/2023   Procedure: THORACIC LAMINECTOMY AND  DISCECTOMY;  Surgeon: Gillie Duncans, MD;  Location: [facility];  Service: Neurosurgery;  Laterality: N/A;   Past Medical History:  Diagnosis Date   Acute kidney injury superimposed on chronic kidney disease (HCC) 04/13/2023   Acute on chronic anemia 04/13/2023   Anemia    Anxiety    Back pain    COPD (chronic obstructive pulmonary disease) (HCC)    Depression    Diabetes mellitus without complication (HCC)    Diverticulosis    with hx of LGIB   Edema    GERD (gastroesophageal reflux disease)    GIB (gastrointestinal bleeding)    Gout    Heavy smoker    Hyperlipidemia    Hypertension    Iron deficiency anemia    Sleep apnea    uses a cpap   Swelling of lower extremity    BP (!) 151/99   Pulse 82   Ht 5' 11 (1.803 m)   Wt (!) 315 lb (142.9 kg) Comment: reported  SpO2 93%   BMI 43.93 kg/m   Opioid Risk Score:   Fall Risk Score:  `1  Depression screen [facility] 2/9     11/30/2023    9:25  AM 10/19/2023   10:59 AM 09/25/2023   10:50 AM  Depression screen PHQ 2/9  Decreased Interest 0 0 0  Down, Depressed, Hopeless 0 0 0  PHQ - 2 Score 0 0 0  Altered sleeping  0   Tired, decreased energy  0   Change in appetite  0   Feeling bad or failure about yourself   0   Trouble concentrating  0   Moving slowly or fidgety/restless  0   Suicidal thoughts  0   PHQ-9 Score  0  Review of Systems  Musculoskeletal:  Positive for back pain.       Right shoulder and both hips and both knees  All other systems reviewed and are negative.       Objective:   Physical Exam Vitals and nursing note reviewed.  Constitutional:      Appearance: Normal appearance.  Neck:     Comments: Cervical Paraspinal Tenderness: C-5-C-6 Cardiovascular:     Rate and Rhythm: Normal rate and regular rhythm.     Pulses: Normal pulses.     Heart sounds: Normal heart sounds.  Pulmonary:     Effort: Pulmonary effort is normal.     Breath sounds: Normal breath sounds.  Musculoskeletal:     Comments: Normal Muscle Bulk and Muscle Testing Reveals:  Upper Extremities: Right: Decreased ROM 90 Degrees and Muscle Strength 5/5 Left Upper Extremity: Full ROM and Muscle Strength 5/5 Lumbar Paraspinal Tenderness: L-4-L-5 Lower Extremities: Decreased ROM and Muscle Strength 5/5 Bilateral Lower Extremities Flexion Produces Pain into his Bilateral Patella's Arrived in wheelchair      Skin:    General: Skin is warm and dry.  Neurological:     Mental Status: He is alert and oriented to person, place, and time.  Psychiatric:        Mood and Affect: Mood normal.        Behavior: Behavior normal.          Assessment & Plan:  Cervicalgia: Continue HEP as Tolerated. Continue to monitor. 11/30/2023 Chronic Pain in Right Shoulder: Continue HEP as Tolerated. Continue to Monitor. 11/30/2023 Thoracic Myelopathy: He reports taking his Baclofen  occasionally and Aquatic Therapy. Continue to Monitor.  11/30/2023 Chronic Bilateral Low Back Pain without Sciatica: Continue HEP as Tolerated. Continue to Monitor. 11/30/2023 Neuropathic Pain of Bilateral Lower Extremities: Continue current medication regimen. Continue to monitor. 11/30/2023 6. Chronic Pain Syndrome: Refilled: Oxycodone  10/325 mg one tablet 4 times a day as needed for pain #120. We will continue the opioid monitoring program, this consists of regular clinic visits, examinations, urine drug screen, pill counts as well as use of [place]  Controlled Substance Reporting system. A 12 month History has been reviewed on the   Controlled Substance Reporting System on 11/30/2023   F/U with Dr. Lovorn

## 2023-11-30 NOTE — Patient Instructions (Signed)
 My- Chart : [phone]

## 2023-12-02 ENCOUNTER — Ambulatory Visit: Admitting: Physical Therapy

## 2023-12-03 LAB — DRUG TOX MONITOR 1 W/CONF, ORAL FLD
Amphetamines: NEGATIVE ng/mL (ref <10)
Barbiturates: NEGATIVE ng/mL (ref <10)
Benzodiazepines: NEGATIVE ng/mL (ref <0.50)
Buprenorphine: NEGATIVE ng/mL (ref <0.10)
Cocaine: NEGATIVE ng/mL (ref <5.0)
Codeine: NEGATIVE ng/mL (ref <2.5)
Dihydrocodeine: NEGATIVE ng/mL (ref <2.5)
Fentanyl: NEGATIVE ng/mL (ref <0.10)
Heroin Metabolite: NEGATIVE ng/mL (ref <1.0)
Hydrocodone: NEGATIVE ng/mL (ref <2.5)
Hydromorphone: NEGATIVE ng/mL (ref <2.5)
MARIJUANA: NEGATIVE ng/mL (ref <2.5)
MDMA: NEGATIVE ng/mL (ref <10)
Meprobamate: NEGATIVE ng/mL (ref <2.5)
Methadone: NEGATIVE ng/mL (ref <5.0)
Morphine: NEGATIVE ng/mL (ref <2.5)
Nicotine Metabolite: NEGATIVE ng/mL (ref <5.0)
Norhydrocodone: NEGATIVE ng/mL (ref <2.5)
Noroxycodone: 31.5 ng/mL — ABNORMAL HIGH (ref <2.5)
Opiates: POSITIVE ng/mL — AB (ref <2.5)
Oxycodone: 236.2 ng/mL — ABNORMAL HIGH (ref <2.5)
Oxymorphone: NEGATIVE ng/mL (ref <2.5)
Phencyclidine: NEGATIVE ng/mL (ref <10)
Tapentadol: NEGATIVE ng/mL (ref <5.0)
Tramadol: NEGATIVE ng/mL (ref <5.0)
Zolpidem: NEGATIVE ng/mL (ref <5.0)

## 2023-12-03 LAB — DRUG TOX ALC METAB W/CON, ORAL FLD: Alcohol Metabolite: NEGATIVE ng/mL (ref <25)

## 2023-12-14 ENCOUNTER — Other Ambulatory Visit: Payer: Self-pay

## 2023-12-16 ENCOUNTER — Ambulatory Visit (INDEPENDENT_AMBULATORY_CARE_PROVIDER_SITE_OTHER): Admitting: Bariatrics

## 2023-12-16 ENCOUNTER — Encounter: Payer: Self-pay | Admitting: Bariatrics

## 2023-12-16 VITALS — BP 116/71 | HR 76 | Temp 97.8°F | Ht 71.0 in | Wt 315.0 lb

## 2023-12-16 DIAGNOSIS — E1122 Type 2 diabetes mellitus with diabetic chronic kidney disease: Secondary | ICD-10-CM | POA: Diagnosis not present

## 2023-12-16 DIAGNOSIS — I129 Hypertensive chronic kidney disease with stage 1 through stage 4 chronic kidney disease, or unspecified chronic kidney disease: Secondary | ICD-10-CM | POA: Diagnosis not present

## 2023-12-16 DIAGNOSIS — Z6841 Body Mass Index (BMI) 40.0 and over, adult: Secondary | ICD-10-CM

## 2023-12-16 DIAGNOSIS — Z7985 Long-term (current) use of injectable non-insulin antidiabetic drugs: Secondary | ICD-10-CM

## 2023-12-16 DIAGNOSIS — I10 Essential (primary) hypertension: Secondary | ICD-10-CM

## 2023-12-16 DIAGNOSIS — N1832 Chronic kidney disease, stage 3b: Secondary | ICD-10-CM | POA: Diagnosis not present

## 2023-12-16 DIAGNOSIS — E559 Vitamin D deficiency, unspecified: Secondary | ICD-10-CM

## 2023-12-16 MED ORDER — VITAMIN D (ERGOCALCIFEROL) 1.25 MG (50000 UNIT) PO CAPS: 50000.0000 [IU] | ORAL_CAPSULE | ORAL | 0 refills | Status: DC

## 2023-12-16 MED ORDER — MOUNJARO 12.5 MG/0.5ML ~~LOC~~ SOAJ: 12.5000 mg | SUBCUTANEOUS | 0 refills | Status: DC

## 2023-12-16 MED ORDER — VALSARTAN-HYDROCHLOROTHIAZIDE 80-12.5 MG PO TABS: 1.0000 | ORAL_TABLET | Freq: Every day | ORAL | 3 refills | Status: DC

## 2023-12-16 NOTE — Progress Notes (Signed)
 WEIGHT SUMMARY AND BIOMETRICS  Weight Lost Since Last Visit: 0  Weight Gained Since Last Visit: 0   Vitals Temp: 97.8 F (36.6 C) BP: 116/71 Pulse Rate: 76 SpO2: 96 %   Anthropometric Measurements Height: 5' 11 (1.803 m) Weight: (!) 315 lb (142.9 kg) BMI (Calculated): 43.95 Weight at Last Visit: 315lb Weight Lost Since Last Visit: 0 Weight Gained Since Last Visit: 0 Starting Weight: 326lb Total Weight Loss (lbs): 11 lb (4.99 kg)   No data recorded Other Clinical Data Fasting: no Labs: no Today's Visit #: 9 Starting Date: 05/13/23    OBESITY Raymond [name] is here to discuss his progress with his obesity treatment plan along with follow-up of his obesity related diagnoses.    Nutrition Plan: the Category 4 plan - 50% adherence.  Current exercise: chair exercises  Interim History:  His weight remains the same as his previous visit. Eating all of the food on the plan., Protein intake is as prescribed, Is skipping meals, Not journaling consistently., Water intake is inadequate., and Denies polyphagia   Pharmacotherapy: Diogo is on Mounjaro  12.5 mg SQ weekly Adverse side effects: None Hunger is moderately controlled.  Cravings are moderately controlled.  Assessment/Plan:   Type II Diabetes HgbA1c is at goal. Last A1c was 6.0 CBGs: Fasting variable      Episodes of hypoglycemia: no Medication(s): Mounjaro  12.5 mg SQ weekly  Lab Results  Component Value Date   HGBA1C 6.0 (H) 03/12/2023   HGBA1C 6.3 (H) 12/25/2022   Lab Results  Component Value Date   LDLCALC 77 05/13/2023   CREATININE 1.87 (H) 05/25/2023   Lab Results  Component Value Date   GFR 92.19 10/05/2014    Plan: Continue and refill Mounjaro  15 mg SQ weekly Will keep all carbohydrates low both sweets and starches.  Will continue exercise regimen to 30 to 60 minutes on most days of  the week.  Aim for 7 to 9 hours of sleep nightly.  Eat more low glycemic index foods.    Vitamin D  Deficiency Vitamin D  is not at goal of 50.  Most recent vitamin D  level was 18.8. He is on  prescription ergocalciferol  50,000 IU weekly. Lab Results  Component Value Date   VD25OH 18.8 (L) 05/13/2023   VD25OH 13.90 (L) 12/29/2022    Plan: Refill prescription vitamin D  50,000 IU weekly.  He will get some sun exposure periodically.  Hypertension Hypertension stable.  Medication(s): Amlodipine  10 mg 1 daily , Coreg  25 mg twice daily , and Diovan -HCT 80-12.5 mg daily, and Cardura  2 mg.  His wife states that his blood pressure has fluctuated widely with readings that are higher than normal such as greater than 140/90 and readings that are around 90/60.  There is some question as to whether he is taking his blood pressure medications correctly.  BP Readings from Last 3 Encounters:  12/16/23 116/71  11/30/23 (!) 153/89  11/18/23 129/79   Lab Results  Component Value Date   CREATININE 1.87 (H) 05/25/2023   CREATININE 1.85 (H) 05/24/2023   CREATININE 1.59 (H) 05/23/2023   Lab Results  Component Value Date   GFR 92.19 10/05/2014    Plan: Continue all antihypertensives at current dosages. No added salt. Will keep sodium content to 1,500 mg or less per day.   His partner will check his blood pressure medications on a regular basis and will make sure that he is taking those medications correctly.   Morbid Obesity: Current BMI BMI (Calculated): 43.95   Pharmacotherapy Plan Continue and refill  Mounjaro  12.5 mg SQ weekly  Markhi is currently in the action stage of change. As such, his goal is to continue with weight loss efforts.  He has agreed to the Category 4 plan.  With more fruits and vegetables along with high-protein.  Exercise goals: Older adults should determine their level of effort for physical activity relative to their level of fitness.   Behavioral modification  strategies: increasing lean protein intake, no meal skipping, increase water intake, better snacking choices, planning for success, increasing vegetables, increasing fiber rich foods, keep healthy foods in the home, weigh protein portions, and mindful eating.  Jaree has agreed to follow-up with our clinic in 4 weeks.   Objective:   VITALS: Per patient if applicable, see vitals. GENERAL: Alert and in no acute distress. CARDIOPULMONARY: No increased WOB. Speaking in clear sentences.  PSYCH: Pleasant and cooperative. Speech normal rate and rhythm. Affect is appropriate. Insight and judgement are appropriate. Attention is focused, linear, and appropriate.  NEURO: Oriented as arrived to appointment on time with no prompting.   Attestation Statements:   This was prepared with the assistance of Engineer, civil (consulting).  Occasional wrong-word or sound-a-like substitutions Silliman have occurred due to the inherent limitations of voice recognition   Clayborne Daring, DO

## 2023-12-30 ENCOUNTER — Other Ambulatory Visit: Payer: Self-pay | Admitting: Physical Medicine and Rehabilitation

## 2023-12-30 ENCOUNTER — Ambulatory Visit: Attending: Physical Medicine and Rehabilitation | Admitting: Physical Therapy

## 2023-12-30 DIAGNOSIS — M6281 Muscle weakness (generalized): Secondary | ICD-10-CM | POA: Insufficient documentation

## 2023-12-30 DIAGNOSIS — M25562 Pain in left knee: Secondary | ICD-10-CM | POA: Diagnosis present

## 2023-12-30 DIAGNOSIS — G8929 Other chronic pain: Secondary | ICD-10-CM | POA: Insufficient documentation

## 2023-12-30 DIAGNOSIS — R2689 Other abnormalities of gait and mobility: Secondary | ICD-10-CM | POA: Diagnosis present

## 2023-12-30 DIAGNOSIS — M25561 Pain in right knee: Secondary | ICD-10-CM | POA: Insufficient documentation

## 2023-12-31 ENCOUNTER — Encounter: Payer: Self-pay | Admitting: Physical Therapy

## 2023-12-31 NOTE — Therapy (Signed)
 OUTPATIENT PHYSICAL THERAPY THORACIC/AQUATIC THERAPY NOTE/RE-CERT     Patient Name: Raymond [name] MRN: [id number] DOB:22-Jan-1966, 58 y.o., male Today's Date: 12/31/2023  END OF SESSION:  PT End of Session - 12/31/23 1918     Visit Number 24    Number of Visits 27    Date for PT Re-Evaluation 12/11/23    Authorization Type [employer] Managed    PT Start Time 1320    PT Stop Time 1400    PT Time Calculation (min) 40 min    Equipment Utilized During Treatment Other (comment)   water walker   Activity Tolerance Patient tolerated treatment well    Behavior During Therapy WFL for tasks assessed/performed            Past Medical History:  Diagnosis Date   Acute kidney injury superimposed on chronic kidney disease (HCC) 04/13/2023   Acute on chronic anemia 04/13/2023   Anemia    Anxiety    Back pain    COPD (chronic obstructive pulmonary disease) (HCC)    Depression    Diabetes mellitus without complication (HCC)    Diverticulosis    with hx of LGIB   Edema    GERD (gastroesophageal reflux disease)    GIB (gastrointestinal bleeding)    Gout    Heavy smoker    Hyperlipidemia    Hypertension    Iron deficiency anemia    Sleep apnea    uses a cpap   Swelling of lower extremity    Past Surgical History:  Procedure Laterality Date   CHONDROPLASTY Left 06/28/2014   Procedure: CHONDROPLASTY;  Surgeon: LELON JONETTA Shari Mickey., MD;  Location: [facility];  Service: Orthopedics;  Laterality: Left;   COLONOSCOPY     FOOT FASCIOTOMY     age 67-rt   KNEE ARTHROSCOPY WITH LATERAL MENISECTOMY Left 06/28/2014   Procedure: KNEE ARTHROSCOPY WITH LATERAL MENISECTOMY;  Surgeon: LELON JONETTA Shari Mickey., MD;  Location: [facility];  Service: Orthopedics;  Laterality: Left;   KNEE ARTHROSCOPY WITH MEDIAL MENISECTOMY Left 06/28/2014   Procedure: LEFT KNEE ARTHROSCOPY CHONDROPLASTY/WITH MEDIAL/LATERAL MENISECTOMIES;  Surgeon: LELON JONETTA Shari Mickey., MD;  Location: [facility];   Service: Orthopedics;  Laterality: Left;   ORIF RADIUS & ULNA FRACTURES  2007   left   THORACIC DISCECTOMY N/A 03/12/2023   Procedure: THORACIC LAMINECTOMY AND  DISCECTOMY;  Surgeon: Gillie Duncans, MD;  Location: [facility] OR;  Service: Neurosurgery;  Laterality: N/A;   Patient Active Problem List   Diagnosis Date Noted   Chronic incomplete paraplegia (HCC) 10/19/2023   Spasticity 10/19/2023   Wheelchair dependence 10/19/2023   Cystitis 05/21/2023   Acute diverticulitis 05/21/2023   Vitamin D  deficiency 05/14/2023   Low HDL (under 40) 05/14/2023   GERD (gastroesophageal reflux disease) 04/13/2023   Constipation 04/13/2023   Chronic back pain 04/13/2023   DVT, bilateral lower limbs (HCC) 04/13/2023   Coping style affecting medical condition 03/27/2023   Thoracic myelopathy with LE weakness 03/23/2023   HNP (herniated nucleus pulposus with myelopathy), thoracic 03/12/2023   Hyperlipidemia 12/24/2022   Failure to thrive  in adult 12/24/2022   Tobacco abuse 03/18/2018   BPH associated with nocturia 03/11/2017   History of substance abuse (HCC) 03/11/2017   Restless leg syndrome 03/11/2017   Restrictive lung disease 05/14/2016   OSA on CPAP 05/14/2016   Moderate COPD (chronic obstructive pulmonary disease) (HCC) 05/14/2016   Smoking greater than 40 pack years 05/14/2016   SOB (shortness of breath) 10/06/2014  Edema of extremities 10/06/2014   Benign essential HTN 10/06/2014   Type 2 diabetes mellitus, without long-term current use of insulin  (HCC) 07/18/2011    PCP: [name], MD  REFERRING PROVIDER:  Lorilee Railing, MD   REFERRING DIAG: [phone] (ICD-10-CM) - Other spondylosis with myelopathy, thoracic region   THERAPY DIAG:  Other abnormalities of gait and mobility  Muscle weakness (generalized)  Chronic pain of right knee  Chronic pain of left knee  Rationale for Evaluation and Treatment: Rehabilitation  ONSET DATE: October 2024  SUBJECTIVE:                                                                                                                                                                                                          SUBJECTIVE STATEMENT:  Pt reports he is doing well; states his bathroom has been modified so that now he is able to get wheelchair in it and slide over to the commode - is extremely happy to be able to do this. Pt reports he is still going to land PT at [facility] - is increasing resistance on the bike - feels legs are getting stronger  Hand dominance: Right  PERTINENT HISTORY:  Broken R ankle x2; history of COPD, T2DM, OSA, tobacco use, CKD 111a, morbid obesity who was admitted on 03/11/2023 with BLE weakness with decreased coordination and gait disorder as well as reports of urinary incontinence. He was found to have large HNP T10/T11 and underwent T10 laminectomy with discectomy by Dr. Gillie on 03/12/2023   PAIN:  Are you having pain? Yes: NPRS scale: 3/10 Pain location: toothache  Pain description: throbbing  Aggravating factors: nothing in PT  Relieving factors: nothing in PT    PRECAUTIONS: None  RED FLAGS: None     WEIGHT BEARING RESTRICTIONS: No  FALLS:  Has patient fallen in last 6 months? Yes. Number of falls 1 -- last fall Oct 2024  LIVING ENVIRONMENT: Lives with: lives with their spouse Lives in: House/apartment Stairs: ramp Has following equipment at home: Environmental consultant - 2 wheeled, Wheelchair (manual), and upright walker, hospital bed  OCCUPATION: Retired  PLOF: Able to do his own ADLs but easier to have wife assist him  PATIENT GOALS: Walk with the walker at least household distances  NEXT MD VISIT: 04/21/23 [place]  OBJECTIVE:  Note: Objective measures were completed at Evaluation unless otherwise noted.  DIAGNOSTIC FINDINGS:  03/12/23 MRI THORACIC SPINE IMPRESSION:   1. Moderate-sized lobulated left subarticular to foraminal disc protrusion at T10-11 with resultant severe  spinal stenosis. Cord is compressed  and deviated to the right at this level. Associated cord signal changes concerning for edema and/or myelomalacia. 2. No other acute abnormality within the thoracic spine. 3. Degenerative spondylosis at T8-9 through T11-12 without significant spinal stenosis. Associated moderate to severe bilateral foraminal narrowing at these levels as above.  MRI LUMBAR SPINE IMPRESSION:   1. No acute abnormality within the lumbar spine. 2. Multifactorial degenerative changes at L4-5 with resultant mild canal with severe left and moderate right lateral recess stenosis, with severe bilateral L4 foraminal narrowing. 3. Additional mild noncompressive disc bulging and facet hypertrophy elsewhere within the lumbar spine as above. No other significant stenosis or frank neural impingement.   Critical Value/emergent results were called by telephone at the time of interpretation on 03/12/2023 at 12:41 am to provider Dr. Griselda, who verbally acknowledged these results.  PATIENT SURVEYS:  FOTO 42; predicted 54; 06/15/23- 27; 07/15/23- 37   COGNITION: Overall cognitive status: Within functional limits for tasks assessed  SENSATION: WFL  POSTURE: rounded shoulders and forward head  PALPATION: No overt tenderness to palpation   CERVICAL ROM: WNL  LOWER EXTREMITY MMT:    MMT Right eval Left eval Right 07/15/23 Left 07/15/23 Right 08/17/23 Left  08/17/23  Hip flexion 3+ 3 4+ 3 4 4+  Hip extension 3+ 3+      Hip abduction 3+ 3+ 4 4 4 4   Hip adduction        Hip internal rotation        Hip external rotation        Knee flexion 4- 3+   3+ 3+  Knee extension 4- 3+ 4+ 4+ 3+ 4+  Ankle dorsiflexion   4+ 4+ 4+ 4  Ankle plantarflexion        Ankle inversion        Ankle eversion         (Blank rows = not tested)   FUNCTIONAL TESTS:  Transfers:  Chair to bed: Multiple seated scoots with UEs  Sit<>stand: max A -- limited due to knee pain Supine to sit<>sit: min  A for trunk and LE negotiation. Not performing log rolling  TODAY'S TREATMENT:                                                                                                                              DATE: 12-31-23  Aquatic PT at [facility] - pool temp 90 degrees  Patient seen for aquatic therapy today.  Treatment took place in water 3.6-4.5 feet deep depending upon activity.  Pt entered and exited the pool via chair lift. Pt transferred from wheelchair to chair lift without use of sliding board with CGA to min assist;  transferred from chair lift to wheelchair with sliding board with min assist  for LE positioning when exiting pool.   Pt performed static standing at pool wall with cues for knee extension - bil. UE support on pool edge  Gait training with water walker approx. 10  laps (18' x 20 ) with CGA to SBA Sidestepping with water walker 18' x 4 reps;  Marching in place - 10 reps each leg holding onto water walker  Squats x 10 reps - holding onto water walker  Pt requires buoyancy of water for support for reduced fall risk and for unloading/reduced stress on joints (B knees) as pt able to tolerate increased standing and ambulation in water compared to that on land; viscosity of water is needed for resistance for strengthening and current of water provides perturbations for challenge for balance training           PATIENT EDUCATION:  Education details: aquatic exercises Person educated: Patient Education method: Programmer, multimedia, Demonstration, and Handouts Education comprehension: verbalized understanding, returned demonstration, and needs further education  HOME EXERCISE PROGRAM: Access Code: [email or web address] URL: [email or web address] Date: 04/21/2023 Prepared by: Gellen April Marie Nonato  Exercises - Staggered Bridge  - 1 x daily - 7 x weekly - 2 sets - 10 reps - Hooklying Isometric Clamshell  - 1 x daily - 7 x weekly - 2 sets - 10 reps - Small Range Straight Leg  Raise  - 1 x daily - 7 x weekly - 2 sets - 10 reps  ASSESSMENT:  CLINICAL IMPRESSION:  Aquatic PT session focused on water walking with use of water walker with CGA to SBA for balance.  Pt able to stand the most erect in today's session that he has stood in any previous aquatic PT session.  Pt reported moderate knee pain with standing squats in 4' water depth with UE support on water walker.  Pt met LTG #2; LTG #1 is ongoing as HEP continues to be established.  Pt has attended only 5 visits since 10-05-23 (beginning of this certification period) due to other medical issues preventing participation in aquatic PT and due to scheduling conflicts.  Renewal to be completed for 4 visits.   Cont with POC.    OBJECTIVE IMPAIRMENTS: Abnormal gait, decreased balance, decreased endurance, decreased mobility, difficulty walking, decreased ROM, decreased strength, increased edema, improper body mechanics, postural dysfunction, obesity, and pain.   GOALS: Goals reviewed with patient? Yes  LONG TERM GOALS: Target date: 09/28/2023       Pt will be ind with management and progression of HEP Baseline:  Goal status: ONGOING 07/15/23; Ongoing 10-05-23  2.  Pt will be able to perform stand-pivot transfers with RW and no more than MinA  Baseline:  Goal status: GOAL MODIFIED  07/15/23; Not met 10-05-23  3.  Pt will be able to perform at least 2 reps of sit to stand in 60 sec with UE support Baseline: Unable to perform complete STS without mod A Goal status: GOAL MODIFIED 07/15/23; Goal not met 10-05-23  4.  Pt will have increased FOTO to >/=54 Baseline:  Goal status: ONGOING 07/15/23; Deferred 10-05-23   NEW Short term goals:  Target date 10-30-23 = 4 weeks    1.  Pt will amb. 2 laps across width of pool (18' x 4 reps) with water walker with +1 min assist.    Baseline:     Goal status:  Goal met 11-04-23    2.  Pt will report ability to stand at home with UE support on counter or at deck railing with SBA for  at least 1 for LE weight bearing for tone management.           Baseline:           Goal status:  Goal met 11-04-23  NEW Long term goals:  Target date 11-27-23 = 8 weeks    1.  Independent in HEP for aquatic exercises to be continued after D/C from PT.    Baseline:    Goal status:  Ongoing    2.  Pt will ambulate 3 laps (18' x 6 reps) across width of pool with large barbell with mod assist +1.    Baseline;    Goal status:  Goal met 12-31-23   NEW Long term goals:  Target date 01-29-24= 4 weeks    1.  Independent in HEP for aquatic exercises to be continued after D/C from PT.    Baseline:    Goal status:  Ongoing    2.  Pt will amb. 1 lap across pool with noodle with min assist to be able to continue aquatic HEP with wife's assist.    Baseline:     Goal status:  NEW  PLAN:   PT FREQUENCY: 1x/week  PT DURATION: 4 weeks (per renewal 12-30-23)  PLANNED INTERVENTIONS: 02835- PT Re-evaluation, 97110-Therapeutic exercises, 97530- Therapeutic activity, 97112- Neuromuscular re-education, 97535- Self Care, 02859- Manual therapy, [id number]- Gait training, [phone]- Orthotic Fit/training, [phone]- Aquatic Therapy, 97014- Electrical stimulation (unattended), 97035- Ultrasound, 02966- Ionotophoresis 4mg /ml Dexamethasone , Patient/Family education, Balance training, Stair training, Taping, Dry Needling, Joint mobilization, Spinal mobilization, Cryotherapy, and Moist heat  PLAN FOR NEXT SESSION: focus on water PT for maximal functional gains  Elvie Kussmaul, PT 12/31/23 7:21 PM

## 2024-01-06 ENCOUNTER — Ambulatory Visit: Attending: Physical Medicine and Rehabilitation | Admitting: Physical Therapy

## 2024-01-06 DIAGNOSIS — G8929 Other chronic pain: Secondary | ICD-10-CM | POA: Insufficient documentation

## 2024-01-06 DIAGNOSIS — M25561 Pain in right knee: Secondary | ICD-10-CM | POA: Diagnosis present

## 2024-01-06 DIAGNOSIS — R2689 Other abnormalities of gait and mobility: Secondary | ICD-10-CM | POA: Insufficient documentation

## 2024-01-06 DIAGNOSIS — M6281 Muscle weakness (generalized): Secondary | ICD-10-CM | POA: Diagnosis present

## 2024-01-06 DIAGNOSIS — M25562 Pain in left knee: Secondary | ICD-10-CM | POA: Insufficient documentation

## 2024-01-06 DIAGNOSIS — R2681 Unsteadiness on feet: Secondary | ICD-10-CM | POA: Diagnosis present

## 2024-01-07 ENCOUNTER — Encounter: Payer: Self-pay | Admitting: Physical Therapy

## 2024-01-08 NOTE — Therapy (Signed)
 OUTPATIENT PHYSICAL THERAPY THORACIC/AQUATIC THERAPY NOTE     Patient Name: Raymond [name] MRN: [id number] DOB:1966/04/30, 58 y.o., male Today's Date: 01/08/2024  END OF SESSION:  PT End of Session - 01/07/24 1136     Visit Number 25    Number of Visits 27    Date for PT Re-Evaluation 03/01/24    Authorization Type [employer] Managed    PT Start Time 1300    PT Stop Time 1358    PT Time Calculation (min) 58 min    Equipment Utilized During Treatment Other (comment)   water walker, single barbell   Activity Tolerance Patient tolerated treatment well    Behavior During Therapy WFL for tasks assessed/performed            Past Medical History:  Diagnosis Date   Acute kidney injury superimposed on chronic kidney disease (HCC) 04/13/2023   Acute on chronic anemia 04/13/2023   Anemia    Anxiety    Back pain    COPD (chronic obstructive pulmonary disease) (HCC)    Depression    Diabetes mellitus without complication (HCC)    Diverticulosis    with hx of LGIB   Edema    GERD (gastroesophageal reflux disease)    GIB (gastrointestinal bleeding)    Gout    Heavy smoker    Hyperlipidemia    Hypertension    Iron deficiency anemia    Sleep apnea    uses a cpap   Swelling of lower extremity    Past Surgical History:  Procedure Laterality Date   CHONDROPLASTY Left 06/28/2014   Procedure: CHONDROPLASTY;  Surgeon: LELON JONETTA Shari Mickey., MD;  Location: [facility];  Service: Orthopedics;  Laterality: Left;   COLONOSCOPY     FOOT FASCIOTOMY     age 78-rt   KNEE ARTHROSCOPY WITH LATERAL MENISECTOMY Left 06/28/2014   Procedure: KNEE ARTHROSCOPY WITH LATERAL MENISECTOMY;  Surgeon: LELON JONETTA Shari Mickey., MD;  Location: [facility];  Service: Orthopedics;  Laterality: Left;   KNEE ARTHROSCOPY WITH MEDIAL MENISECTOMY Left 06/28/2014   Procedure: LEFT KNEE ARTHROSCOPY CHONDROPLASTY/WITH MEDIAL/LATERAL MENISECTOMIES;  Surgeon: LELON JONETTA Shari Mickey., MD;  Location: [facility];  Service: Orthopedics;  Laterality: Left;   ORIF RADIUS & ULNA FRACTURES  2007   left   THORACIC DISCECTOMY N/A 03/12/2023   Procedure: THORACIC LAMINECTOMY AND  DISCECTOMY;  Surgeon: Gillie Duncans, MD;  Location: [facility];  Service: Neurosurgery;  Laterality: N/A;   Patient Active Problem List   Diagnosis Date Noted   Chronic incomplete paraplegia (HCC) 10/19/2023   Spasticity 10/19/2023   Wheelchair dependence 10/19/2023   Cystitis 05/21/2023   Acute diverticulitis 05/21/2023   Vitamin D  deficiency 05/14/2023   Low HDL (under 40) 05/14/2023   GERD (gastroesophageal reflux disease) 04/13/2023   Constipation 04/13/2023   Chronic back pain 04/13/2023   DVT, bilateral lower limbs (HCC) 04/13/2023   Coping style affecting medical condition 03/27/2023   Thoracic myelopathy with LE weakness 03/23/2023   HNP (herniated nucleus pulposus with myelopathy), thoracic 03/12/2023   Hyperlipidemia 12/24/2022   Failure to thrive  in adult 12/24/2022   Tobacco abuse 03/18/2018   BPH associated with nocturia 03/11/2017   History of substance abuse (HCC) 03/11/2017   Restless leg syndrome 03/11/2017   Restrictive lung disease 05/14/2016   OSA on CPAP 05/14/2016   Moderate COPD (chronic obstructive pulmonary disease) (HCC) 05/14/2016   Smoking greater than 40 pack years 05/14/2016   SOB (shortness of breath)  10/06/2014   Edema of extremities 10/06/2014   Benign essential HTN 10/06/2014   Type 2 diabetes mellitus, without long-term current use of insulin  (HCC) 07/18/2011    PCP: Raymond Miquel Faden, MD  REFERRING PROVIDER:  Lorilee Railing, MD   REFERRING DIAG: [phone] (ICD-10-CM) - Other spondylosis with myelopathy, thoracic region   THERAPY DIAG:  Other abnormalities of gait and mobility  Muscle weakness (generalized)  Chronic pain of right knee  Chronic pain of left knee  Unsteadiness on feet  Rationale for Evaluation and Treatment: Rehabilitation  ONSET DATE:  October 2024  SUBJECTIVE:                                                                                                                                                                                                         SUBJECTIVE STATEMENT:  Pt reports he is doing well - continues to stand some at home  Hand dominance: Right  PERTINENT HISTORY:  Broken R ankle x2; history of COPD, T2DM, OSA, tobacco use, CKD 111a, morbid obesity who was admitted on 03/11/2023 with BLE weakness with decreased coordination and gait disorder as well as reports of urinary incontinence. He was found to have large HNP T10/T11 and underwent T10 laminectomy with discectomy by Dr. Gillie on 03/12/2023   PAIN:  Are you having pain? Yes: NPRS scale: 3/10 Pain location: toothache  Pain description: throbbing  Aggravating factors: nothing in PT  Relieving factors: nothing in PT    PRECAUTIONS: None  RED FLAGS: None     WEIGHT BEARING RESTRICTIONS: No  FALLS:  Has patient fallen in last 6 months? Yes. Number of falls 1 -- last fall Oct 2024  LIVING ENVIRONMENT: Lives with: lives with their spouse Lives in: House/apartment Stairs: ramp Has following equipment at home: Environmental consultant - 2 wheeled, Wheelchair (manual), and upright walker, hospital bed  OCCUPATION: Retired  PLOF: Able to do his own ADLs but easier to have wife assist him  PATIENT GOALS: Walk with the walker at least household distances  NEXT MD VISIT: 04/21/23 [place]  OBJECTIVE:  Note: Objective measures were completed at Evaluation unless otherwise noted.  DIAGNOSTIC FINDINGS:  03/12/23 MRI THORACIC SPINE IMPRESSION:   1. Moderate-sized lobulated left subarticular to foraminal disc protrusion at T10-11 with resultant severe spinal stenosis. Cord is compressed and deviated to the right at this level. Associated cord signal changes concerning for edema and/or myelomalacia. 2. No other acute abnormality within the thoracic  spine. 3. Degenerative spondylosis at T8-9 through T11-12 without significant spinal stenosis. Associated moderate to severe bilateral foraminal  narrowing at these levels as above.  MRI LUMBAR SPINE IMPRESSION:   1. No acute abnormality within the lumbar spine. 2. Multifactorial degenerative changes at L4-5 with resultant mild canal with severe left and moderate right lateral recess stenosis, with severe bilateral L4 foraminal narrowing. 3. Additional mild noncompressive disc bulging and facet hypertrophy elsewhere within the lumbar spine as above. No other significant stenosis or frank neural impingement.   Critical Value/emergent results were called by telephone at the time of interpretation on 03/12/2023 at 12:41 am to provider Dr. Griselda, who verbally acknowledged these results.  PATIENT SURVEYS:  FOTO 42; predicted 54; 06/15/23- 27; 07/15/23- 37   COGNITION: Overall cognitive status: Within functional limits for tasks assessed  SENSATION: WFL  POSTURE: rounded shoulders and forward head  PALPATION: No overt tenderness to palpation   CERVICAL ROM: WNL  LOWER EXTREMITY MMT:    MMT Right eval Left eval Right 07/15/23 Left 07/15/23 Right 08/17/23 Left  08/17/23  Hip flexion 3+ 3 4+ 3 4 4+  Hip extension 3+ 3+      Hip abduction 3+ 3+ 4 4 4 4   Hip adduction        Hip internal rotation        Hip external rotation        Knee flexion 4- 3+   3+ 3+  Knee extension 4- 3+ 4+ 4+ 3+ 4+  Ankle dorsiflexion   4+ 4+ 4+ 4  Ankle plantarflexion        Ankle inversion        Ankle eversion         (Blank rows = not tested)   FUNCTIONAL TESTS:  Transfers:  Chair to bed: Multiple seated scoots with UEs  Sit<>stand: max A -- limited due to knee pain Supine to sit<>sit: min A for trunk and LE negotiation. Not performing log rolling  TODAY'S TREATMENT:                                                                                                                               DATE:  01-06-24  Aquatic PT at [facility] - pool temp 90 degrees  Patient seen for aquatic therapy today.  Treatment took place in water 3.6-4.5 feet deep depending upon activity.  Pt entered and exited the pool via chair lift. Pt transferred from wheelchair to chair lift without use of sliding board with CGA to min assist;  transferred from chair lift to wheelchair with sliding board with min assist  for LE positioning when exiting pool.   Pt performed static standing at pool wall with cues for knee extension - bil. UE support on pool edge  Gait training with water walker approx. 10 laps (18' x 20 ) with CGA to SBA; with UE support on single barbell with min to mod assist approx. 4 laps Sidestepping with water walker 18' x 4 reps  Pt performed static standing with UE support on water walker  and with single barbell later in session  Marching in place - 10 reps each leg holding onto water walker  Squats x 10 reps - holding onto water walker  Sitting on bench in pool - pt performed bicycling LE's 10 reps; performed hip abduction 10 reps with knees extended  Pt performed tall kneeling on step in pool with min to mod assist for balance;  RLE spasmed intermittently in this position; performed small range hip hinges 10 reps  Pt requires buoyancy of water for support for reduced fall risk and for unloading/reduced stress on joints (B knees) as pt able to tolerate increased standing and ambulation in water compared to that on land; viscosity of water is needed for resistance for strengthening and current of water provides perturbations for challenge for balance training           PATIENT EDUCATION:  Education details: aquatic exercises Person educated: Patient Education method: Programmer, multimedia, Demonstration, and Handouts Education comprehension: verbalized understanding, returned demonstration, and needs further education  HOME EXERCISE PROGRAM: Access Code: [email or web address] URL:  [email or web address] Date: 04/21/2023 Prepared by: Gellen April Marie Nonato  Exercises - Staggered Bridge  - 1 x daily - 7 x weekly - 2 sets - 10 reps - Hooklying Isometric Clamshell  - 1 x daily - 7 x weekly - 2 sets - 10 reps - Small Range Straight Leg Raise  - 1 x daily - 7 x weekly - 2 sets - 10 reps  ASSESSMENT:  CLINICAL IMPRESSION:  Aquatic PT session focused on water walking with use of water walker and with single barbell in today's session to increase challenge with standing balance.  Session also focused on LE strengthening with pt having more spasms in LE' in today's session compared to that in previous session last week.  Pt continues to have moderate to severe pain in Rt knee with smalle range squats and marching.  Pt is progressing well with aquatic exercise but is limited with carry over to land exercises requiring LE weight bearing due to knee pain and knee joint dysfunction.  Cont with POC.    OBJECTIVE IMPAIRMENTS: Abnormal gait, decreased balance, decreased endurance, decreased mobility, difficulty walking, decreased ROM, decreased strength, increased edema, improper body mechanics, postural dysfunction, obesity, and pain.   GOALS: Goals reviewed with patient? Yes  LONG TERM GOALS: Target date: 09/28/2023       Pt will be ind with management and progression of HEP Baseline:  Goal status: ONGOING 07/15/23; Ongoing 10-05-23  2.  Pt will be able to perform stand-pivot transfers with RW and no more than MinA  Baseline:  Goal status: GOAL MODIFIED  07/15/23; Not met 10-05-23  3.  Pt will be able to perform at least 2 reps of sit to stand in 60 sec with UE support Baseline: Unable to perform complete STS without mod A Goal status: GOAL MODIFIED 07/15/23; Goal not met 10-05-23  4.  Pt will have increased FOTO to >/=54 Baseline:  Goal status: ONGOING 07/15/23; Deferred 10-05-23   NEW Short term goals:  Target date 10-30-23 = 4 weeks    1.  Pt will amb.  2 laps across width of pool (18' x 4 reps) with water walker with +1 min assist.    Baseline:     Goal status:  Goal met 11-04-23    2.  Pt will report ability to stand at home with UE support on counter or at deck [name] with SBA for at least 1 for LE weight bearing  for tone management.           Baseline:           Goal status:  Goal met 11-04-23  NEW Long term goals:  Target date 11-27-23 = 8 weeks    1.  Independent in HEP for aquatic exercises to be continued after D/C from PT.    Baseline:    Goal status:  Ongoing    2.  Pt will ambulate 3 laps (18' x 6 reps) across width of pool with large barbell with mod assist +1.    Baseline;    Goal status:  Goal met 12-31-23   NEW Long term goals:  Target date 01-29-24= 4 weeks    1.  Independent in HEP for aquatic exercises to be continued after D/C from PT.    Baseline:    Goal status:  Ongoing    2.  Pt will amb. 1 lap across pool with noodle with min assist to be able to continue aquatic HEP with wife's assist.    Baseline:     Goal status:  NEW  PLAN:   PT FREQUENCY: 1x/week  PT DURATION: 4 weeks (per renewal 12-30-23)  PLANNED INTERVENTIONS: 02835- PT Re-evaluation, 97110-Therapeutic exercises, 97530- Therapeutic activity, 97112- Neuromuscular re-education, 97535- Self Care, 02859- Manual therapy, [id number]- Gait training, [phone]- Orthotic Fit/training, [phone]- Aquatic Therapy, 97014- Electrical stimulation (unattended), 97035- Ultrasound, 02966- Ionotophoresis 4mg /ml Dexamethasone , Patient/Family education, Balance training, Stair training, Taping, Dry Needling, Joint mobilization, Spinal mobilization, Cryotherapy, and Moist heat  PLAN FOR NEXT SESSION: focus on water PT for maximal functional gains  Elvie Kussmaul, PT 01/08/24 9:40 AM

## 2024-01-11 ENCOUNTER — Encounter: Payer: Self-pay | Admitting: Registered Nurse

## 2024-01-11 ENCOUNTER — Encounter: Attending: Physical Medicine and Rehabilitation | Admitting: Registered Nurse

## 2024-01-11 VITALS — BP 155/97 | HR 94 | Ht 71.0 in | Wt 315.0 lb

## 2024-01-11 DIAGNOSIS — Z5181 Encounter for therapeutic drug level monitoring: Secondary | ICD-10-CM

## 2024-01-11 DIAGNOSIS — Z993 Dependence on wheelchair: Secondary | ICD-10-CM

## 2024-01-11 DIAGNOSIS — M4714 Other spondylosis with myelopathy, thoracic region: Secondary | ICD-10-CM | POA: Diagnosis not present

## 2024-01-11 DIAGNOSIS — G894 Chronic pain syndrome: Secondary | ICD-10-CM | POA: Diagnosis not present

## 2024-01-11 DIAGNOSIS — M25511 Pain in right shoulder: Secondary | ICD-10-CM

## 2024-01-11 DIAGNOSIS — M25562 Pain in left knee: Secondary | ICD-10-CM

## 2024-01-11 DIAGNOSIS — M25561 Pain in right knee: Secondary | ICD-10-CM

## 2024-01-11 DIAGNOSIS — R252 Cramp and spasm: Secondary | ICD-10-CM

## 2024-01-11 DIAGNOSIS — M545 Low back pain, unspecified: Secondary | ICD-10-CM | POA: Diagnosis not present

## 2024-01-11 DIAGNOSIS — G8929 Other chronic pain: Secondary | ICD-10-CM | POA: Insufficient documentation

## 2024-01-11 DIAGNOSIS — Z79891 Long term (current) use of opiate analgesic: Secondary | ICD-10-CM

## 2024-01-11 DIAGNOSIS — G8222 Paraplegia, incomplete: Secondary | ICD-10-CM | POA: Diagnosis not present

## 2024-01-11 DIAGNOSIS — M25512 Pain in left shoulder: Secondary | ICD-10-CM

## 2024-01-11 MED ORDER — OXYCODONE-ACETAMINOPHEN 10-325 MG PO TABS: 1.0000 | ORAL_TABLET | Freq: Four times a day (QID) | ORAL | 0 refills | Status: DC | PRN

## 2024-01-11 NOTE — Progress Notes (Signed)
 Subjective:    Patient ID: Raymond [name], male    DOB: May 17, 1966, 58 y.o.   MRN: [id number]  HPI: Raymond [name] is a 58 y.o. male who was scheduled for virtual visit, he was unable to access My-Chart visit. His visit was changed to telephone visit,  I connected with Raymond [name]  by telephone and verified that I am speaking with the correct person using two identifiers.  Location: Patient: In his Home  Provider: In the Office    I discussed the limitations, risks, security and privacy concerns of performing an evaluation and management service by telephone and the availability of in person appointments. I also discussed with the patient that there Hippert be a patient responsible charge related to this service. The patient expressed understanding and agreed to proceed.  He states his  pain is located in his bilateral shoulders L>R, mid- lower back, bilateral lower extremities and bilateral feet . He rates her pain 7. His current exercise regime is attending water aerobics weekly.  Raymond [name] Morphine  equivalent is 60.00 MME. She is also prescribed Alprazolam    by Dr. Marsa .We have discussed the black box warning of using opioids and benzodiazepines. I highlighted the dangers of using these drugs together and discussed the adverse events including respiratory suppression, overdose, cognitive impairment and importance of compliance with current regimen. We will continue to monitor and adjust as indicated.      Last Oral Swab was Performed on 11/30/2023, it was consistent.     Pain Inventory Average Pain 6 Pain Right Now 7 My pain is constant, sharp, burning, dull, stabbing, tingling, and aching  In the last 24 hours, has pain interfered with the following? General activity 10 Relation with others 8 Enjoyment of life 10 What TIME of day is your pain at its worst? night Sleep (in general) Poor  Pain is worse with: sitting and inactivity Pain improves with: medication and ice Relief from  Meds: 9  Family History  Problem Relation Age of Onset   Diabetes Mother    Hypertension Mother    High Cholesterol Mother    Heart disease Mother    Depression Mother    Diabetes Father    Hypertension Father    High Cholesterol Father    Heart disease Father    Cancer Other    Social History   Socioeconomic History   Marital status: Single    Spouse name: Not on file   Number of children: Not on file   Years of education: Not on file   Highest education level: Not on file  Occupational History   Not on file  Tobacco Use   Smoking status: Former    Current packs/day: 2.00    Types: Cigarettes   Smokeless tobacco: Never  Vaping Use   Vaping status: Never Used  Substance and Sexual Activity   Alcohol use: Yes    Comment: rare   Drug use: No   Sexual activity: Not on file  Other Topics Concern   Not on file  Social History Narrative   Not on file   Social Drivers of Health   Financial Resource Strain: Not on file  Food Insecurity: No Food Insecurity (05/21/2023)   Hunger Vital Sign    Worried About Running Out of Food in the Last Year: Never true    Ran Out of Food in the Last Year: Never true  Transportation Needs: No Transportation Needs (05/21/2023)   PRAPARE - Transportation  Lack of Transportation (Medical): No    Lack of Transportation (Non-Medical): No  Physical Activity: Not on file  Stress: No Stress Concern Present (03/21/2022)   Received from [facility] - Occupational Stress Questionnaire    Feeling of Stress : Not at all  Social Connections: Unknown (01/10/2022)   Received from [facility]   Social Network    Social Network: Not on file   Past Surgical History:  Procedure Laterality Date   CHONDROPLASTY Left 06/28/2014   Procedure: CHONDROPLASTY;  Surgeon: LELON JONETTA Shari Mickey., MD;  Location: [facility];  Service: Orthopedics;  Laterality: Left;   COLONOSCOPY     FOOT FASCIOTOMY      age 40-rt   KNEE ARTHROSCOPY WITH LATERAL MENISECTOMY Left 06/28/2014   Procedure: KNEE ARTHROSCOPY WITH LATERAL MENISECTOMY;  Surgeon: LELON JONETTA Shari Mickey., MD;  Location: [facility];  Service: Orthopedics;  Laterality: Left;   KNEE ARTHROSCOPY WITH MEDIAL MENISECTOMY Left 06/28/2014   Procedure: LEFT KNEE ARTHROSCOPY CHONDROPLASTY/WITH MEDIAL/LATERAL MENISECTOMIES;  Surgeon: LELON JONETTA Shari Mickey., MD;  Location: [facility];  Service: Orthopedics;  Laterality: Left;   ORIF RADIUS & ULNA FRACTURES  2007   left   THORACIC DISCECTOMY N/A 03/12/2023   Procedure: THORACIC LAMINECTOMY AND  DISCECTOMY;  Surgeon: Gillie Duncans, MD;  Location: [facility] OR;  Service: Neurosurgery;  Laterality: N/A;   Past Surgical History:  Procedure Laterality Date   CHONDROPLASTY Left 06/28/2014   Procedure: CHONDROPLASTY;  Surgeon: LELON JONETTA Shari Mickey., MD;  Location: [facility];  Service: Orthopedics;  Laterality: Left;   COLONOSCOPY     FOOT FASCIOTOMY     age 40-rt   KNEE ARTHROSCOPY WITH LATERAL MENISECTOMY Left 06/28/2014   Procedure: KNEE ARTHROSCOPY WITH LATERAL MENISECTOMY;  Surgeon: LELON JONETTA Shari Mickey., MD;  Location: [facility];  Service: Orthopedics;  Laterality: Left;   KNEE ARTHROSCOPY WITH MEDIAL MENISECTOMY Left 06/28/2014   Procedure: LEFT KNEE ARTHROSCOPY CHONDROPLASTY/WITH MEDIAL/LATERAL MENISECTOMIES;  Surgeon: LELON JONETTA Shari Mickey., MD;  Location: [facility];  Service: Orthopedics;  Laterality: Left;   ORIF RADIUS & ULNA FRACTURES  2007   left   THORACIC DISCECTOMY N/A 03/12/2023   Procedure: THORACIC LAMINECTOMY AND  DISCECTOMY;  Surgeon: Gillie Duncans, MD;  Location: [facility];  Service: Neurosurgery;  Laterality: N/A;   Past Medical History:  Diagnosis Date   Acute kidney injury superimposed on chronic kidney disease (HCC) 04/13/2023   Acute on chronic anemia 04/13/2023   Anemia    Anxiety    Back pain    COPD (chronic obstructive pulmonary  disease) (HCC)    Depression    Diabetes mellitus without complication (HCC)    Diverticulosis    with hx of LGIB   Edema    GERD (gastroesophageal reflux disease)    GIB (gastrointestinal bleeding)    Gout    Heavy smoker    Hyperlipidemia    Hypertension    Iron deficiency anemia    Sleep apnea    uses a cpap   Swelling of lower extremity    Ht 5' 11 (1.803 m)   Wt (!) 315 lb (142.9 kg) Comment: Per Patient today video visit  BMI 43.93 kg/m   Opioid Risk Score:   Fall Risk Score:  `1  Depression screen [facility] 2/9     11/30/2023    9:25 AM 10/19/2023   10:59 AM 09/25/2023   10:50 AM  Depression screen PHQ 2/9  Decreased Interest 0 0 0  Down, Depressed, Hopeless 0 0 0  PHQ - 2 Score 0 0 0  Altered sleeping  0   Tired, decreased energy  0   Change in appetite  0   Feeling bad or failure about yourself   0   Trouble concentrating  0   Moving slowly or fidgety/restless  0   Suicidal thoughts  0   PHQ-9 Score  0     Review of Systems  Musculoskeletal:  Positive for back pain.       Pain in both knees, hips, feet, legs  All other systems reviewed and are negative.      Objective:   Physical Exam Vitals and nursing note reviewed.  Musculoskeletal:     Comments: No Physical Exam Performed : Telephone Visit           Assessment & Plan:  Cervicalgia: Continue HEP as Tolerated. Continue to monitor. 01/11/2024 Chronic Pain in Right Shoulder: Continue HEP as Tolerated. Continue to Monitor. 01/11/2024 Thoracic Myelopathy: He reports taking his Baclofen  occasionally and Aquatic Therapy. Continue to Monitor. 01/11/2024 Chronic Bilateral Low Back Pain without Sciatica: Continue HEP as Tolerated. Continue to Monitor. 01/11/2024 Neuropathic Pain of Bilateral Lower Extremities: Continue current medication regimen. Continue to monitor. 01/11/2024 6. Chronic Pain Syndrome: Refilled: Oxycodone  10/325 mg one tablet 4 times a day as needed for pain #120. We will continue the  opioid monitoring program, this consists of regular clinic visits, examinations, urine drug screen, pill counts as well as use of [place]  Controlled Substance Reporting system. A 12 month History has been reviewed on the [place]  Controlled Substance Reporting System on 01/11/2024   F/U with Dr. Cornelio   Telephone Visit Establish Patient:  Location of Patient: In his Home Location of Provider: In the Office  Total Time Spent: 10 Minutes

## 2024-01-14 ENCOUNTER — Ambulatory Visit (INDEPENDENT_AMBULATORY_CARE_PROVIDER_SITE_OTHER): Admitting: Bariatrics

## 2024-01-14 ENCOUNTER — Encounter: Payer: Self-pay | Admitting: Bariatrics

## 2024-01-14 VITALS — BP 116/73 | HR 71 | Temp 98.1°F | Ht 71.0 in | Wt 318.0 lb

## 2024-01-14 DIAGNOSIS — E559 Vitamin D deficiency, unspecified: Secondary | ICD-10-CM

## 2024-01-14 DIAGNOSIS — E119 Type 2 diabetes mellitus without complications: Secondary | ICD-10-CM

## 2024-01-14 DIAGNOSIS — I10 Essential (primary) hypertension: Secondary | ICD-10-CM | POA: Diagnosis not present

## 2024-01-14 DIAGNOSIS — Z7985 Long-term (current) use of injectable non-insulin antidiabetic drugs: Secondary | ICD-10-CM

## 2024-01-14 DIAGNOSIS — Z6841 Body Mass Index (BMI) 40.0 and over, adult: Secondary | ICD-10-CM

## 2024-01-14 DIAGNOSIS — N1832 Chronic kidney disease, stage 3b: Secondary | ICD-10-CM

## 2024-01-14 DIAGNOSIS — E66813 Obesity, class 3: Secondary | ICD-10-CM

## 2024-01-14 MED ORDER — ONDANSETRON HCL 4 MG PO TABS: 4.0000 mg | ORAL_TABLET | Freq: Three times a day (TID) | ORAL | 0 refills | Status: DC | PRN

## 2024-01-14 MED ORDER — VITAMIN D (ERGOCALCIFEROL) 1.25 MG (50000 UNIT) PO CAPS: 50000.0000 [IU] | ORAL_CAPSULE | ORAL | 0 refills | Status: DC

## 2024-01-14 MED ORDER — MOUNJARO 12.5 MG/0.5ML ~~LOC~~ SOAJ: 12.5000 mg | SUBCUTANEOUS | 0 refills | Status: DC

## 2024-01-14 NOTE — Progress Notes (Signed)
 WEIGHT SUMMARY AND BIOMETRICS  Weight Lost Since Last Visit: 0  Weight Gained Since Last Visit: 3lb   Vitals Temp: 98.1 F (36.7 C) BP: 116/73 Pulse Rate: 71 SpO2: 98 %   Anthropometric Measurements Height: 5' 11 (1.803 m) Weight: (!) 318 lb (144.2 kg) BMI (Calculated): 44.37 Weight at Last Visit: 315lb Weight Lost Since Last Visit: 0 Weight Gained Since Last Visit: 3lb Starting Weight: 326lb Total Weight Loss (lbs): 8 lb (3.629 kg)   No data recorded Other Clinical Data Fasting: no Labs: no Today's Visit #: 10 Starting Date: 05/13/23    OBESITY Raymond [name] is here to discuss his progress with his obesity treatment plan along with follow-up of his obesity related diagnoses.    Nutrition Plan: the Category 4 plan - 70% adherence.  Current exercise: chair exercises  Interim History:  He gained 3 lbs since his last visit. He is eating better on a regular basis. He has using more salt. He is drinking adequate water.  He has problems with fluid balance and takes diuretics.  The bioimpedance scale is not accurate today as far as his muscle mass or water weight.  Eating all of the food on the plan., Protein intake is as prescribed, Water intake is adequate., and Denies polyphagia   Pharmacotherapy: Raymond [name] is on Mounjaro  12.5 mg SQ weekly Adverse side effects: None Hunger is moderately controlled.  Cravings are moderately controlled.  Assessment/Plan:   Type II Diabetes HgbA1c is at goal. Last A1c was 6.0 CBGs: Fasting 120 up to 170      Episodes of hypoglycemia: no Medication(s): Mounjaro  12.5 mg SQ weekly  Lab Results  Component Value Date   HGBA1C 6.0 (H) 03/12/2023   HGBA1C 6.3 (H) 12/25/2022   Lab Results  Component Value Date   LDLCALC 77 05/13/2023   CREATININE 1.87 (H) 05/25/2023   Lab Results  Component Value Date   GFR 92.19 10/05/2014     Plan: Continue and refill Mounjaro  12.5 mg SQ weekly Will keep all carbohydrates low both sweets and starches.  Aim for 7 to 9 hours of sleep nightly.  Eat more low glycemic index foods.  Will continue to eat balanced meals with nonstarchy vegetables and low sugar fruits. He will count his calories and protein. He will minimize any added salt in his diet. Will continue his exercise program  Hypertension Hypertension well controlled.  Medication(s): Amlodipine  10 mg 1 daily  and Coreg  25 mg twice daily   BP Readings from Last 3 Encounters:  01/14/24 116/73  01/11/24 (!) 155/97  12/16/23 116/71   Lab Results  Component Value Date   CREATININE 1.87 (H) 05/25/2023   CREATININE 1.85 (H) 05/24/2023   CREATININE 1.59 (H) 05/23/2023   Lab Results  Component Value Date   GFR 92.19 10/05/2014    Plan: Continue all antihypertensives at current dosages. No added salt.  Will keep sodium content to 1,500 mg or less per day.   He will continue his exercise program with swimming on Wednesdays and weights and using a recumbent bike on Thursday and Friday.  He is also working on balance and strength.  Vitamin D  Deficiency Vitamin D  is not at goal of 50.  Most recent vitamin D  level was 18.8. He is on  prescription ergocalciferol  50,000 IU weekly. Lab Results  Component Value Date   VD25OH 18.8 (L) 05/13/2023   VD25OH 13.90 (L) 12/29/2022    Plan: Refill prescription vitamin D  50,000 IU weekly.    Morbid Obesity: Current BMI BMI (Calculated): 44.37   Pharmacotherapy Plan Continue and refill  Mounjaro  12.5 mg SQ weekly  Raymond [name] is currently in the action stage of change. As such, his goal is to continue with weight loss efforts.  He has agreed to the Category 4 plan.  Exercise goals: All adults should avoid inactivity. Some physical activity is better than none, and adults who participate in any amount of physical activity gain some health benefits. As noted above he will  continue his swimming on Wednesday and his weights and riding the bike on Thursday and Friday.   Behavioral modification strategies: increasing lean protein intake, decreasing simple carbohydrates , no meal skipping, meal planning , better snacking choices, planning for success, decrease junk food, get rid of junk food in the home, avoiding temptations, measure portion sizes, and mindful eating.  Raymond [name] has agreed to follow-up with our clinic in 4 weeks.      Objective:   VITALS: Per patient if applicable, see vitals. GENERAL: Alert and in no acute distress. CARDIOPULMONARY: No increased WOB. Speaking in clear sentences.  PSYCH: Pleasant and cooperative. Speech normal rate and rhythm. Affect is appropriate. Insight and judgement are appropriate. Attention is focused, linear, and appropriate.  NEURO: Oriented as arrived to appointment on time with no prompting.   Attestation Statements:   This was prepared with the assistance of Engineer, civil (consulting).  Occasional wrong-word or sound-a-like substitutions Dress have occurred due to the inherent limitations of voice recognition   Raymond Daring, DO

## 2024-01-18 ENCOUNTER — Inpatient Hospital Stay (HOSPITAL_COMMUNITY)
Admission: EM | Admit: 2024-01-18 | Discharge: 2024-01-22 | DRG: 813 | Disposition: A | Discharge status: Home Health | Attending: Internal Medicine | Admitting: Internal Medicine

## 2024-01-18 ENCOUNTER — Emergency Department (HOSPITAL_COMMUNITY)

## 2024-01-18 ENCOUNTER — Other Ambulatory Visit: Payer: Self-pay

## 2024-01-18 ENCOUNTER — Encounter (HOSPITAL_COMMUNITY): Payer: Self-pay

## 2024-01-18 DIAGNOSIS — I129 Hypertensive chronic kidney disease with stage 1 through stage 4 chronic kidney disease, or unspecified chronic kidney disease: Secondary | ICD-10-CM | POA: Diagnosis present

## 2024-01-18 DIAGNOSIS — R5381 Other malaise: Secondary | ICD-10-CM | POA: Diagnosis present

## 2024-01-18 DIAGNOSIS — D6832 Hemorrhagic disorder due to extrinsic circulating anticoagulants: Secondary | ICD-10-CM | POA: Diagnosis not present

## 2024-01-18 DIAGNOSIS — Z6841 Body Mass Index (BMI) 40.0 and over, adult: Secondary | ICD-10-CM

## 2024-01-18 DIAGNOSIS — N3090 Cystitis, unspecified without hematuria: Secondary | ICD-10-CM | POA: Diagnosis present

## 2024-01-18 DIAGNOSIS — Z993 Dependence on wheelchair: Secondary | ICD-10-CM

## 2024-01-18 DIAGNOSIS — Z7901 Long term (current) use of anticoagulants: Secondary | ICD-10-CM

## 2024-01-18 DIAGNOSIS — Z882 Allergy status to sulfonamides status: Secondary | ICD-10-CM

## 2024-01-18 DIAGNOSIS — Z8249 Family history of ischemic heart disease and other diseases of the circulatory system: Secondary | ICD-10-CM

## 2024-01-18 DIAGNOSIS — L89612 Pressure ulcer of right heel, stage 2: Secondary | ICD-10-CM | POA: Diagnosis present

## 2024-01-18 DIAGNOSIS — Z7409 Other reduced mobility: Secondary | ICD-10-CM | POA: Diagnosis present

## 2024-01-18 DIAGNOSIS — R310 Gross hematuria: Secondary | ICD-10-CM

## 2024-01-18 DIAGNOSIS — R319 Hematuria, unspecified: Secondary | ICD-10-CM | POA: Diagnosis present

## 2024-01-18 DIAGNOSIS — R252 Cramp and spasm: Secondary | ICD-10-CM | POA: Diagnosis present

## 2024-01-18 DIAGNOSIS — R3915 Urgency of urination: Secondary | ICD-10-CM | POA: Diagnosis present

## 2024-01-18 DIAGNOSIS — Z79899 Other long term (current) drug therapy: Secondary | ICD-10-CM

## 2024-01-18 DIAGNOSIS — F419 Anxiety disorder, unspecified: Secondary | ICD-10-CM | POA: Diagnosis present

## 2024-01-18 DIAGNOSIS — J449 Chronic obstructive pulmonary disease, unspecified: Secondary | ICD-10-CM | POA: Diagnosis present

## 2024-01-18 DIAGNOSIS — N401 Enlarged prostate with lower urinary tract symptoms: Secondary | ICD-10-CM | POA: Diagnosis present

## 2024-01-18 DIAGNOSIS — H3530 Unspecified macular degeneration: Secondary | ICD-10-CM | POA: Diagnosis present

## 2024-01-18 DIAGNOSIS — G4733 Obstructive sleep apnea (adult) (pediatric): Secondary | ICD-10-CM | POA: Diagnosis present

## 2024-01-18 DIAGNOSIS — I10 Essential (primary) hypertension: Secondary | ICD-10-CM | POA: Diagnosis present

## 2024-01-18 DIAGNOSIS — B9620 Unspecified Escherichia coli [E. coli] as the cause of diseases classified elsewhere: Secondary | ICD-10-CM | POA: Diagnosis present

## 2024-01-18 DIAGNOSIS — E1122 Type 2 diabetes mellitus with diabetic chronic kidney disease: Secondary | ICD-10-CM | POA: Diagnosis present

## 2024-01-18 DIAGNOSIS — E66813 Obesity, class 3: Secondary | ICD-10-CM | POA: Diagnosis present

## 2024-01-18 DIAGNOSIS — Z7985 Long-term (current) use of injectable non-insulin antidiabetic drugs: Secondary | ICD-10-CM

## 2024-01-18 DIAGNOSIS — R112 Nausea with vomiting, unspecified: Secondary | ICD-10-CM | POA: Diagnosis present

## 2024-01-18 DIAGNOSIS — Z818 Family history of other mental and behavioral disorders: Secondary | ICD-10-CM

## 2024-01-18 DIAGNOSIS — Z833 Family history of diabetes mellitus: Secondary | ICD-10-CM

## 2024-01-18 DIAGNOSIS — N1831 Chronic kidney disease, stage 3a: Secondary | ICD-10-CM | POA: Diagnosis present

## 2024-01-18 DIAGNOSIS — G9341 Metabolic encephalopathy: Secondary | ICD-10-CM | POA: Diagnosis not present

## 2024-01-18 DIAGNOSIS — K219 Gastro-esophageal reflux disease without esophagitis: Secondary | ICD-10-CM | POA: Diagnosis present

## 2024-01-18 DIAGNOSIS — M109 Gout, unspecified: Secondary | ICD-10-CM | POA: Diagnosis present

## 2024-01-18 DIAGNOSIS — E559 Vitamin D deficiency, unspecified: Secondary | ICD-10-CM | POA: Diagnosis present

## 2024-01-18 DIAGNOSIS — E785 Hyperlipidemia, unspecified: Secondary | ICD-10-CM | POA: Diagnosis present

## 2024-01-18 DIAGNOSIS — E119 Type 2 diabetes mellitus without complications: Secondary | ICD-10-CM

## 2024-01-18 DIAGNOSIS — N179 Acute kidney failure, unspecified: Principal | ICD-10-CM | POA: Diagnosis present

## 2024-01-18 DIAGNOSIS — N3001 Acute cystitis with hematuria: Secondary | ICD-10-CM | POA: Diagnosis present

## 2024-01-18 DIAGNOSIS — M549 Dorsalgia, unspecified: Secondary | ICD-10-CM | POA: Diagnosis present

## 2024-01-18 DIAGNOSIS — Z86718 Personal history of other venous thrombosis and embolism: Secondary | ICD-10-CM

## 2024-01-18 DIAGNOSIS — Z87891 Personal history of nicotine dependence: Secondary | ICD-10-CM

## 2024-01-18 DIAGNOSIS — G8929 Other chronic pain: Secondary | ICD-10-CM | POA: Diagnosis present

## 2024-01-18 DIAGNOSIS — F32A Depression, unspecified: Secondary | ICD-10-CM | POA: Diagnosis present

## 2024-01-18 DIAGNOSIS — Z83438 Family history of other disorder of lipoprotein metabolism and other lipidemia: Secondary | ICD-10-CM

## 2024-01-18 DIAGNOSIS — E873 Alkalosis: Secondary | ICD-10-CM | POA: Diagnosis present

## 2024-01-18 DIAGNOSIS — R0602 Shortness of breath: Secondary | ICD-10-CM | POA: Diagnosis present

## 2024-01-18 DIAGNOSIS — E876 Hypokalemia: Secondary | ICD-10-CM | POA: Diagnosis present

## 2024-01-18 DIAGNOSIS — R350 Frequency of micturition: Secondary | ICD-10-CM | POA: Diagnosis present

## 2024-01-18 LAB — CBC
HCT: 40.6 % (ref 39.0–52.0)
Hemoglobin: 13.3 g/dL (ref 13.0–17.0)
MCH: 31 pg (ref 26.0–34.0)
MCHC: 32.8 g/dL (ref 30.0–36.0)
MCV: 94.6 fL (ref 80.0–100.0)
Platelets: 383 K/uL (ref 150–400)
RBC: 4.29 MIL/uL (ref 4.22–5.81)
RDW: 15.6 % — ABNORMAL HIGH (ref 11.5–15.5)
WBC: 14.6 K/uL — ABNORMAL HIGH (ref 4.0–10.5)
nRBC: 0 % (ref 0.0–0.2)

## 2024-01-18 LAB — COMPREHENSIVE METABOLIC PANEL WITH GFR
ALT: 17 U/L (ref 0–44)
AST: 15 U/L (ref 15–41)
Albumin: 3.6 g/dL (ref 3.5–5.0)
Alkaline Phosphatase: 82 U/L (ref 38–126)
Anion gap: 18 — ABNORMAL HIGH (ref 5–15)
BUN: 49 mg/dL — ABNORMAL HIGH (ref 6–20)
CO2: 27 mmol/L (ref 22–32)
Calcium: 10.5 mg/dL — ABNORMAL HIGH (ref 8.9–10.3)
Chloride: 92 mmol/L — ABNORMAL LOW (ref 98–111)
Creatinine, Ser: 3.02 mg/dL — ABNORMAL HIGH (ref 0.61–1.24)
GFR, Estimated: 23 mL/min — ABNORMAL LOW (ref >60)
Glucose, Bld: 152 mg/dL — ABNORMAL HIGH (ref 70–99)
Potassium: 3.8 mmol/L (ref 3.5–5.1)
Sodium: 137 mmol/L (ref 135–145)
Total Bilirubin: 0.6 mg/dL (ref 0.0–1.2)
Total Protein: 8.6 g/dL — ABNORMAL HIGH (ref 6.5–8.1)

## 2024-01-18 LAB — URINALYSIS, ROUTINE W REFLEX MICROSCOPIC
Bacteria, UA: NONE SEEN
Bilirubin Urine: NEGATIVE
Glucose, UA: NEGATIVE mg/dL
Ketones, ur: NEGATIVE mg/dL
Nitrite: NEGATIVE
Protein, ur: 100 mg/dL — AB
RBC / HPF: >50 RBC/hpf (ref 0–5)
Specific Gravity, Urine: 1.009 (ref 1.005–1.030)
pH: 6 (ref 5.0–8.0)

## 2024-01-18 LAB — LIPASE, BLOOD: Lipase: 28 U/L (ref 11–51)

## 2024-01-18 LAB — CK: Total CK: 26 U/L — ABNORMAL LOW (ref 49–397)

## 2024-01-18 MED ORDER — TOPIRAMATE 25 MG PO TABS: 25.0000 mg | ORAL_TABLET | Freq: Two times a day (BID) | ORAL | Status: DC

## 2024-01-18 MED ORDER — PRAMIPEXOLE DIHYDROCHLORIDE 0.25 MG PO TABS
0.5000 mg | ORAL_TABLET | Freq: Two times a day (BID) | ORAL | Status: DC
  Administered 2024-01-18 – 2024-01-22 (×8): 0.5 mg via ORAL
  Filled 2024-01-18 (×8): qty 2

## 2024-01-18 MED ORDER — SODIUM CHLORIDE 0.9 % IV SOLN
1.0000 g | INTRAVENOUS | Status: DC
  Administered 2024-01-18 – 2024-01-21 (×4): 1 g via INTRAVENOUS
  Filled 2024-01-18 (×4): qty 10

## 2024-01-18 MED ORDER — HYDROCODONE-ACETAMINOPHEN 5-325 MG PO TABS
1.0000 | ORAL_TABLET | ORAL | Status: DC | PRN
  Administered 2024-01-19 (×2): 2 via ORAL
  Filled 2024-01-18 (×2): qty 2

## 2024-01-18 MED ORDER — HYDRALAZINE HCL 20 MG/ML IJ SOLN: 5.0000 mg | Freq: Four times a day (QID) | INTRAMUSCULAR | Status: DC | PRN

## 2024-01-18 MED ORDER — SODIUM CHLORIDE 0.9 % IV SOLN: INTRAVENOUS | Status: AC

## 2024-01-18 MED ORDER — PANTOPRAZOLE SODIUM 40 MG PO TBEC
40.0000 mg | DELAYED_RELEASE_TABLET | Freq: Two times a day (BID) | ORAL | Status: DC
  Administered 2024-01-19 – 2024-01-22 (×6): 40 mg via ORAL
  Filled 2024-01-18 (×6): qty 1

## 2024-01-18 MED ORDER — SODIUM CHLORIDE 0.9 % IV SOLN: 250.0000 mL | INTRAVENOUS | Status: AC | PRN

## 2024-01-18 MED ORDER — AMLODIPINE BESYLATE 5 MG PO TABS
10.0000 mg | ORAL_TABLET | Freq: Every day | ORAL | Status: DC
  Administered 2024-01-18 – 2024-01-22 (×5): 10 mg via ORAL
  Filled 2024-01-18 (×5): qty 2

## 2024-01-18 MED ORDER — ONDANSETRON HCL 4 MG/2ML IJ SOLN
4.0000 mg | Freq: Once | INTRAMUSCULAR | Status: AC
  Administered 2024-01-18: 4 mg via INTRAVENOUS
  Filled 2024-01-18: qty 2

## 2024-01-18 MED ORDER — CARVEDILOL 12.5 MG PO TABS
25.0000 mg | ORAL_TABLET | Freq: Two times a day (BID) | ORAL | Status: DC
  Administered 2024-01-19 – 2024-01-22 (×6): 25 mg via ORAL
  Filled 2024-01-18 (×6): qty 2

## 2024-01-18 MED ORDER — HYDRALAZINE HCL 25 MG PO TABS
25.0000 mg | ORAL_TABLET | Freq: Three times a day (TID) | ORAL | Status: DC
  Administered 2024-01-18 – 2024-01-22 (×10): 25 mg via ORAL
  Filled 2024-01-18 (×10): qty 1

## 2024-01-18 MED ORDER — LIDOCAINE 5 % EX PTCH
2.0000 | MEDICATED_PATCH | Freq: Every day | CUTANEOUS | Status: DC
  Administered 2024-01-19 – 2024-01-21 (×3): 2 via TRANSDERMAL
  Filled 2024-01-18 (×3): qty 2

## 2024-01-18 MED ORDER — POLYETHYLENE GLYCOL 3350 17 G PO PACK
17.0000 g | PACK | Freq: Every day | ORAL | Status: DC
  Administered 2024-01-19 – 2024-01-22 (×4): 17 g via ORAL
  Filled 2024-01-18 (×4): qty 1

## 2024-01-18 MED ORDER — DOXAZOSIN MESYLATE 2 MG PO TABS
2.0000 mg | ORAL_TABLET | Freq: Every day | ORAL | Status: DC
  Administered 2024-01-19 – 2024-01-21 (×3): 2 mg via ORAL
  Filled 2024-01-18 (×4): qty 1

## 2024-01-18 MED ORDER — MORPHINE SULFATE (PF) 4 MG/ML IV SOLN
4.0000 mg | Freq: Once | INTRAVENOUS | Status: AC
  Administered 2024-01-18: 4 mg via INTRAVENOUS
  Filled 2024-01-18: qty 1

## 2024-01-18 MED ORDER — ALPRAZOLAM 0.25 MG PO TABS
0.5000 mg | ORAL_TABLET | Freq: Two times a day (BID) | ORAL | Status: DC | PRN
  Filled 2024-01-18: qty 2

## 2024-01-18 MED ORDER — SODIUM CHLORIDE 0.9% FLUSH: 3.0000 mL | INTRAVENOUS | Status: DC | PRN

## 2024-01-18 MED ORDER — ONDANSETRON HCL 4 MG/2ML IJ SOLN
4.0000 mg | Freq: Four times a day (QID) | INTRAMUSCULAR | Status: DC | PRN
  Administered 2024-01-18: 4 mg via INTRAVENOUS
  Filled 2024-01-18: qty 2

## 2024-01-18 MED ORDER — BACLOFEN 10 MG PO TABS
15.0000 mg | ORAL_TABLET | Freq: Four times a day (QID) | ORAL | Status: DC
Start: 2024-01-18 — End: 2024-01-20
  Administered 2024-01-18 – 2024-01-20 (×6): 15 mg via ORAL
  Filled 2024-01-18 (×7): qty 2

## 2024-01-18 MED ORDER — ALBUTEROL SULFATE (2.5 MG/3ML) 0.083% IN NEBU: 3.0000 mL | INHALATION_SOLUTION | Freq: Four times a day (QID) | RESPIRATORY_TRACT | Status: DC | PRN

## 2024-01-18 MED ORDER — ACETAMINOPHEN 650 MG RE SUPP: 650.0000 mg | Freq: Four times a day (QID) | RECTAL | Status: DC | PRN

## 2024-01-18 MED ORDER — ATORVASTATIN CALCIUM 40 MG PO TABS
40.0000 mg | ORAL_TABLET | Freq: Every evening | ORAL | Status: DC
  Administered 2024-01-18 – 2024-01-21 (×3): 40 mg via ORAL
  Filled 2024-01-18 (×4): qty 1

## 2024-01-18 MED ORDER — ACETAMINOPHEN 325 MG PO TABS
650.0000 mg | ORAL_TABLET | Freq: Four times a day (QID) | ORAL | Status: DC | PRN
  Administered 2024-01-21: 650 mg via ORAL
  Filled 2024-01-18: qty 2

## 2024-01-18 MED ORDER — ONDANSETRON HCL 4 MG PO TABS: 4.0000 mg | ORAL_TABLET | Freq: Four times a day (QID) | ORAL | Status: DC | PRN

## 2024-01-18 MED ORDER — SODIUM CHLORIDE 0.9% FLUSH
3.0000 mL | Freq: Two times a day (BID) | INTRAVENOUS | Status: DC
  Administered 2024-01-18 – 2024-01-22 (×7): 3 mL via INTRAVENOUS

## 2024-01-18 MED ORDER — SODIUM CHLORIDE 0.9 % IV BOLUS
1000.0000 mL | Freq: Once | INTRAVENOUS | Status: AC
  Administered 2024-01-18: 1000 mL via INTRAVENOUS

## 2024-01-18 MED ORDER — POLYETHYLENE GLYCOL 3350 17 G PO PACK: 17.0000 g | PACK | Freq: Every day | ORAL | Status: DC | PRN

## 2024-01-18 MED ORDER — VITAMIN D (ERGOCALCIFEROL) 1.25 MG (50000 UNIT) PO CAPS
50000.0000 [IU] | ORAL_CAPSULE | ORAL | Status: DC
  Administered 2024-01-20: 50000 [IU] via ORAL
  Filled 2024-01-18: qty 1

## 2024-01-18 MED ORDER — DULOXETINE HCL 30 MG PO CPEP
30.0000 mg | ORAL_CAPSULE | Freq: Every day | ORAL | Status: DC
  Administered 2024-01-19 – 2024-01-22 (×4): 30 mg via ORAL
  Filled 2024-01-18 (×4): qty 1

## 2024-01-18 NOTE — ED Provider Notes (Signed)
 [facility] Provider Note   CSN: [id number] Arrival date & time: 01/18/24  1417     Patient presents with: Hematuria and Abdominal Pain   Raymond [name] is a 58 y.o. male who  has a past medical history of Acute kidney injury superimposed on chronic kidney disease (HCC) (04/13/2023), Acute on chronic anemia (04/13/2023), Anemia, Anxiety, Back pain, COPD (chronic obstructive pulmonary disease) (HCC), Depression, Diabetes mellitus without complication (HCC), Diverticulosis, Edema, GERD (gastroesophageal reflux disease), GIB (gastrointestinal bleeding), Gout, Heavy smoker, Hyperlipidemia, Hypertension, Iron deficiency anemia, Sleep apnea, and Swelling of lower extremity and Chronic back pain.  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  He has a history of a ruptured disc and is no longer ambulatory.  He is also dealing with a wound on his heel.  History is given by the patient and his wife at bedside.  They report that about 3 weeks ago patient started having nausea and vomiting.  1 week ago he began having pain on the left side of his abdomen which is spread across his entire abdomen.  On Friday his wife noticed that his urine was opaque and then Saturday noticed that the urine appeared dark and bloody.  She states that she has macular degeneration has some difficulty seeing but states states that she can tell color fairly well.  They both report that today the urine was almost black in color.  She also reports is a lot of sediment in his urine.  He also began noticing that his stool turned dark.  He did take Pepto-Bismol but that was about 3 weeks ago.  He is on chronic oxycodone  pain regimen.  He denies fevers or chills.  He has a slight cough.   Hematuria Associated symptoms include abdominal pain.  Abdominal Pain Associated symptoms: hematuria        Prior to Admission medications   Medication Sig Start Date End  Date Taking? Authorizing Provider  albuterol  (VENTOLIN  HFA) 108 (90 Base) MCG/ACT inhaler Inhale 2 puffs into the lungs every 6 (six) hours as needed for wheezing or shortness of breath. 05/12/23   Raulkar, Sven SQUIBB, MD  allopurinol  (ZYLOPRIM ) 300 MG tablet Take 1 tablet (300 mg total) by mouth daily. 05/12/23   Raulkar, Sven SQUIBB, MD  ALPRAZolam  (XANAX ) 0.5 MG tablet Take 1 tablet (0.5 mg total) by mouth 2 (two) times daily as needed. 05/12/23   Raulkar, Sven SQUIBB, MD  amLODipine  (NORVASC ) 10 MG tablet Take 10 mg by mouth daily. 07/20/23   [provider]  apixaban  (ELIQUIS ) 5 MG TABS tablet Take 1 tablet (5 mg total) by mouth 2 (two) times daily. 05/12/23   Raulkar, Sven SQUIBB, MD  atorvastatin  (LIPITOR) 40 MG tablet Take 1 tablet (40 mg total) by mouth every evening. 05/12/23   Raulkar, Sven SQUIBB, MD  baclofen  (LIORESAL ) 10 MG tablet Take 1.5 tablets (15 mg total) by mouth 4 (four) times daily. X 1 week then increase to 20 mg QID- for spasticity with Tizanidine  10/19/23   Lovorn, Megan, MD  carvedilol  (COREG ) 25 MG tablet Take 1 tablet (25 mg total) by mouth in the morning and at bedtime. 05/12/23   Raulkar, Sven SQUIBB, MD  diclofenac  Sodium (VOLTAREN ) 1 % GEL Apply 4 g topically 4 (four) times daily. To bilateral knees 05/12/23   Raulkar, Sven SQUIBB, MD  doxazosin  (CARDURA ) 2 MG tablet Take 1 tablet (2 mg total) by mouth at bedtime. 05/12/23  Lorilee Sven SQUIBB, MD  DULoxetine  (CYMBALTA ) 30 MG capsule Take 1 capsule by mouth once daily 06/24/23   Raulkar, Krutika P, MD  furosemide  (LASIX ) 80 MG tablet Take 80 mg by mouth 2 (two) times daily.    [provider]  hydrALAZINE  (APRESOLINE ) 25 MG tablet Take 25 mg by mouth 3 (three) times daily.    [provider]  lidocaine  (LIDODERM ) 5 % Place 2 patches onto the skin daily at 10 pm. Remove & Discard patch within 12 hours or as directed by MD 05/12/23   Raulkar, Sven SQUIBB, MD  nystatin-triamcinolone (MYCOLOG II) cream  SMARTSIG:Topical Morning-Evening    [provider]  omeprazole (PRILOSEC) 40 MG capsule Take 40 mg by mouth daily. 07/02/23   [provider]  ondansetron  (ZOFRAN ) 4 MG tablet Take 1 tablet (4 mg total) by mouth every 8 (eight) hours as needed for nausea or vomiting. 01/14/24   Delores Shields A, DO  oxyCODONE -acetaminophen  (PERCOCET) 10-325 MG tablet Take 1 tablet by mouth 4 (four) times daily as needed for pain. 01/11/24   Debby Fidela CROME, NP  polyethylene glycol powder (GLYCOLAX /MIRALAX ) 17 GM/SCOOP powder Take 17 g by mouth daily. 05/12/23   Raulkar, Sven SQUIBB, MD  Potassium Chloride  ER 20 MEQ TBCR Take 1.5 tablets by mouth 2 (two) times daily. 07/17/23   [provider]  pramipexole  (MIRAPEX ) 0.5 MG tablet Take 1 tablet (0.5 mg total) by mouth in the morning and at bedtime. 05/12/23   Raulkar, Sven SQUIBB, MD  tamsulosin  (FLOMAX ) 0.4 MG CAPS capsule Take 1 capsule (0.4 mg total) by mouth at bedtime. 04/09/23   Love, Sharlet RAMAN, PA-C  tirzepatide  (MOUNJARO ) 12.5 MG/0.5ML Pen Inject 12.5 mg into the skin once a week. 01/14/24   Delores Shields A, DO  tiZANidine  (ZANAFLEX ) 4 MG tablet Start with 4 mg at bedtime x 4 days, then 4 mg BID x 4 days, then increase to 4 mg in AM and 8 mg at bedtime x 4 days, then 4 mg in AM, 4 mg in Afternoon and 8 mg at bedtime- for spasticity with baclofen  10/19/23   Lovorn, Megan, MD  topiramate  (TOPAMAX ) 25 MG tablet Take 1 tablet (25 mg total) by mouth 2 (two) times daily. 05/12/23   Raulkar, Sven SQUIBB, MD  valsartan -hydrochlorothiazide  (DIOVAN -HCT) 80-12.5 MG tablet Take 1 tablet by mouth daily. 12/16/23   Delores Shields A, DO  Vitamin D , Ergocalciferol , (DRISDOL ) 1.25 MG (50000 UNIT) CAPS capsule Take 1 capsule (50,000 Units total) by mouth every 14 (fourteen) days. 01/14/24   Delores Shields A, DO    Allergies: Sulfa  antibiotics    Review of Systems  Gastrointestinal:  Positive for abdominal pain.  Genitourinary:  Positive for hematuria.    Updated  Vital Signs BP (!) 173/105   Pulse 97   Temp 97.9 F (36.6 C)   Resp (!) 22   SpO2 93%   Physical Exam Vitals and nursing note reviewed.  Constitutional:      General: He is not in acute distress.    Appearance: He is well-developed. He is not diaphoretic.  HENT:     Head: Normocephalic and atraumatic.  Eyes:     General: No scleral icterus.    Conjunctiva/sclera: Conjunctivae normal.  Cardiovascular:     Rate and Rhythm: Normal rate and regular rhythm.     Heart sounds: Normal heart sounds.  Pulmonary:     Effort: Pulmonary effort is normal. No respiratory distress.     Breath sounds: Normal breath  sounds.  Abdominal:     Palpations: Abdomen is soft.     Tenderness: There is abdominal tenderness in the right upper quadrant, epigastric area, left upper quadrant and left lower quadrant. There is guarding.  Musculoskeletal:     Cervical back: Normal range of motion and neck supple.  Skin:    General: Skin is warm and dry.  Neurological:     Mental Status: He is alert.  Psychiatric:        Behavior: Behavior normal.     (all labs ordered are listed, but only abnormal results are displayed) Labs Reviewed  CBC - Abnormal; Notable for the following components:      Result Value   WBC 14.6 (*)    RDW 15.6 (*)    All other components within normal limits  LIPASE, BLOOD  COMPREHENSIVE METABOLIC PANEL WITH GFR  URINALYSIS, ROUTINE W REFLEX MICROSCOPIC    EKG: None  Radiology: No results found.   .Critical Care  Performed by: Arloa Chroman, PA-C Authorized by: Arloa Chroman, PA-C   Critical care provider statement:    Critical care time (minutes):  30   Critical care time was exclusive of:  Separately billable procedures and treating other patients   Critical care was necessary to treat or prevent imminent or life-threatening deterioration of the following conditions:  Renal failure   Critical care was time spent personally by me on the following activities:   Development of treatment plan with patient or surrogate, discussions with consultants, evaluation of patient's response to treatment, examination of patient, ordering and review of laboratory studies, ordering and review of radiographic studies, ordering and performing treatments and interventions, pulse oximetry, re-evaluation of patient's condition, review of old charts, interpretation of cardiac output measurements and obtaining history from patient or surrogate    Medications Ordered in the ED - No data to display  Clinical Course as of 01/22/24 1634  Mon Jan 18, 2024  [address]  [AH]  8085 Dr.Mackenzie states that the most likely etiology is urinary tract infection and he recommends treating with antibiotics over the next 48 hours she would likely clear up however if it is not improving then urology can be consulted again. [AH]    Clinical Course User Index [AH] Arloa Chroman, PA-C                                 Medical Decision Making Amount and/or Complexity of Data Reviewed Labs: ordered. Radiology: ordered. ECG/medicine tests: ordered.  Risk Prescription drug management. Decision regarding hospitalization.   This patient presents to the ED for concern of pain nausea vomiting and dark urine `, this involves an extensive number of treatment options, and is a complaint that carries with it a high risk of complications and morbidity.  The differential diagnosis for generalized abdominal pain includes, but is not limited to AAA, gastroenteritis, appendicitis, Bowel obstruction, Bowel perforation. Gastroparesis, DKA, Hernia, Inflammatory bowel disease, mesenteric ischemia, pancreatitis, peritonitis SBP, volvulus.   Co morbidities:   has a past medical history of Acute kidney injury superimposed on chronic kidney disease (HCC) (04/13/2023), Acute on chronic anemia (04/13/2023), Anemia, Anxiety, Back pain, COPD (chronic obstructive pulmonary disease) (HCC), Depression, Diabetes  mellitus without complication (HCC), Diverticulosis, Edema, GERD (gastroesophageal reflux disease), GIB (gastrointestinal bleeding), Gout, Heavy smoker, Hyperlipidemia, Hypertension, Iron deficiency anemia, Sleep apnea, and Swelling of lower extremity.   Social Determinants of Health:   SDOH Screenings  Food Insecurity: No Food Insecurity (01/18/2024)  Housing: Low Risk  (01/18/2024)  Transportation Needs: No Transportation Needs (01/18/2024)  Utilities: Not At Risk (01/18/2024)  Depression (PHQ2-9): Low Risk  (01/11/2024)  Social Connections: Unknown (01/10/2022)   Received from [facility]  Stress: No Stress Concern Present (03/21/2022)   Received from [facility]  Tobacco Use: Medium Risk (01/18/2024)     Additional history:  {Additional history obtained from bedside   Lab Tests:  I Ordered, and personally interpreted labs.  The pertinent results include:    CK within normal limits White blood cell count 14.6 thousand CMP shows glucose 152, Creatinine 3.02 >> 1.87 BUN also elevated 49 Lipase within normal limits Urine appears to be likely infected with greater than 50 red blood cells, 21-50 white blood cells although no bacteria seen on high-power field. Imaging Studies:  I ordered imaging studies including CT abdomen and pelvis with out contrast I independently visualized and interpreted imaging which showed subtle layering hyperdensity in the posterior bladder likely hemorrhage no other abnormal findings I agree with the radiologist interpretation  Cardiac Monitoring/ECG:  The patient was maintained on a cardiac monitor.  I personally viewed and interpreted the cardiac monitored which showed an underlying rhythm of:  Sinus rhythm with ectopic atrial rhythm at a rate of 85     Medicines ordered and prescription drug management:  I ordered medication including  Rocephin , fluids, pain meds, antiemetics for UTI pain and nausea Reevaluation of the patient after  these medicines showed that the patient improved I have reviewed the patients home medicines and have made adjustments as needed  Test Considered:   I considered renal ultrasound however patient appears to have hemorrhage in the bladder  Critical Interventions:   blood resuscitation for acute kidney injury   Consultations Obtained: As per ED   Problem List / ED Course:     ICD-10-CM   1. AKI (acute kidney injury) (HCC)  N17.9 Ambulatory referral to Nephrology    Ambulatory referral to Physical Therapy    2. Gross hematuria  R31.0     3. Spasticity  R25.2 Ambulatory referral to Physical Therapy      MDM: Patient with new onset AKI.  Patient Jagielski have UTI versus hemorrhage.  Patient also notably with us  few weeks of nausea and vomiting since this this could also be due to significant dehydration   Dispostion:  After consideration of the diagnostic results and the patients response to treatment, I feel that the patent would benefit from mission.      Final diagnoses:  None    ED Discharge Orders     None          Arloa Chroman, PA-C 01/22/24 1644    Kammerer, Megan L, DO 01/23/24 1620

## 2024-01-18 NOTE — ED Triage Notes (Signed)
 Pt c.o lower abd pain and hematuria for the past week. Taking oxycodone  without any relief.

## 2024-01-18 NOTE — ED Notes (Signed)
 Transporter called to have pt transport to the floor

## 2024-01-18 NOTE — H&P (Addendum)
 [facility] History and Physical  Raymond [name] FMW:[id number] DOB: 1965-06-21 DOA: 01/18/2024  Referring physician: ED  PCP: Raymond Miquel Faden, MD   Patient is coming from: Home  Chief Complaint: Hematuria  HPI: Patient is a 58 years old male with past medical history of chronic kidney disease, COPD, depression, diabetes, history of diverticulosis, GI bleed, hyperlipidemia, hypertension, sleep apnea and chronic  back pain, history of DVT in the past with ambulatory dysfunction presented to the hospital with complaints of 2 weeks of nausea vomiting and subsequently had a week of intermittent abdominal pain.  This Friday patient started seeing cloudy dark urine like [employer] with frequency urgency and dysuria.  Patient denied any fever chills or rigor.  Patient also stated some black stools for the last few days.  On Eliquis  at home for anticoagulation.  At baseline patient does not really ambulate much secondary to  chronic back pain and has history of chronic DVT for which he is on anticoagulation.  In the ED, vitals were notable for slightly elevated blood pressure.  There was no urinary retention.  CBC showed a mild leukocytosis with WBC at 14.6 and hemoglobin of 13.3.  CMP showed creatinine of 3.0 from baseline around 2.0.  CT scan of the abdomen showed subtle layering hyperdensity within the posterior bladder which might represent hemorrhage or infection.  No urinary tract calculi or hydronephrosis. .  Patient was then considered for admission to hospital for further evaluation and treatment.  Assessment and Plan Principal Problem:   Hematuria Active Problems:   SOB (shortness of breath)   Benign essential HTN   Hyperlipidemia   OSA on CPAP   Moderate COPD (chronic obstructive pulmonary disease) (HCC)   Type 2 diabetes mellitus, without long-term current use of insulin  (HCC)   Chronic back pain   Cystitis   Hematuria/possible cystitis.[name] History of BPH.  Associated with  urgency frequency dysuria with abnormal urinalysis.  Has some abdominal pain as well.  ED provider communicated with Dr. Sherrilee with urology who recommended treatment with antibiotics for the next 48 hours to see if it will clear up if not urology consultation would be appropriate.    Will continue with IV Rocephin , hydration  and closely monitor.  Hold off with Eliquis  for now.  Will transfuse PRBC as necessary.  Bladder scan every 6 hours.  Follow urine cultures, continue doxazosin   Black stools as per the patient.  Will get stool occult blood when stool sample is available. Will continue to monitor CBC.  Hold off with Eliquis  for now.  Patient's hemoglobin on presentation is 13.3.  Uncertain if it was true hematochezia.  Continue Protonix  twice daily.    Shortness of breath likely secondary to restrictive lung disease moderate COPD and sleep apnea.  Has some degree of cough shortness of breath and congestion at baseline..  Continue with supportive care including bronchodilators, incentive spirometry.  CPAP at nighttime.  Acute kidney injury on CKD 3a.  Creatinine of 3.0 at this time from baseline around 1.6-1.8. continue with hydration.  Check BMP in AM.  Hold allopurinol  for now.  On Lasix  80 mg twice a day at home.  Will hold Lasix  due to AKI.  Debility deconditioning wheelchair-bound status.  Has chronic back pain and is mostly immobile.  .  Get PT OT evaluation  Essential hypertension  Will amlodipine , hydralazine  and Coreg  from home.  Hold valsartan  and hydrochlorothiazide  for now.  Hyperlipidemia.  Continue Lipitor.  Lower extremity spasms.  On baclofen , will  continue from home   DVT Prophylaxis: Sequential compression devices  Review of Systems:  All systems were reviewed and were negative unless otherwise mentioned in the HPI   Past Medical History:  Diagnosis Date   Acute kidney injury superimposed on chronic kidney disease (HCC) 04/13/2023   Acute on chronic anemia  04/13/2023   Anemia    Anxiety    Back pain    COPD (chronic obstructive pulmonary disease) (HCC)    Depression    Diabetes mellitus without complication (HCC)    Diverticulosis    with hx of LGIB   Edema    GERD (gastroesophageal reflux disease)    GIB (gastrointestinal bleeding)    Gout    Heavy smoker    Hyperlipidemia    Hypertension    Iron deficiency anemia    Sleep apnea    uses a cpap   Swelling of lower extremity    Past Surgical History:  Procedure Laterality Date   CHONDROPLASTY Left 06/28/2014   Procedure: CHONDROPLASTY;  Surgeon: LELON JONETTA Shari Mickey., MD;  Location: [facility];  Service: Orthopedics;  Laterality: Left;   COLONOSCOPY     FOOT FASCIOTOMY     age 56-rt   KNEE ARTHROSCOPY WITH LATERAL MENISECTOMY Left 06/28/2014   Procedure: KNEE ARTHROSCOPY WITH LATERAL MENISECTOMY;  Surgeon: LELON JONETTA Shari Mickey., MD;  Location: [facility];  Service: Orthopedics;  Laterality: Left;   KNEE ARTHROSCOPY WITH MEDIAL MENISECTOMY Left 06/28/2014   Procedure: LEFT KNEE ARTHROSCOPY CHONDROPLASTY/WITH MEDIAL/LATERAL MENISECTOMIES;  Surgeon: LELON JONETTA Shari Mickey., MD;  Location: [facility];  Service: Orthopedics;  Laterality: Left;   ORIF RADIUS & ULNA FRACTURES  2007   left   THORACIC DISCECTOMY N/A 03/12/2023   Procedure: THORACIC LAMINECTOMY AND  DISCECTOMY;  Surgeon: Gillie Duncans, MD;  Location: [facility] OR;  Service: Neurosurgery;  Laterality: N/A;    Social History:  reports that he has quit smoking. His smoking use included cigarettes. He has never used smokeless tobacco. He reports current alcohol use. He reports that he does not use drugs.  Allergies  Allergen Reactions   Sulfa  Antibiotics Hives    Other Reaction(s): Unknown    Family History  Problem Relation Age of Onset   Diabetes Mother    Hypertension Mother    High Cholesterol Mother    Heart disease Mother    Depression Mother    Diabetes Father    Hypertension Father    High  Cholesterol Father    Heart disease Father    Cancer Other      Prior to Admission medications   Medication Sig Start Date End Date Taking? Authorizing Provider  albuterol  (VENTOLIN  HFA) 108 (90 Base) MCG/ACT inhaler Inhale 2 puffs into the lungs every 6 (six) hours as needed for wheezing or shortness of breath. 05/12/23   Raulkar, Sven SQUIBB, MD  allopurinol  (ZYLOPRIM ) 300 MG tablet Take 1 tablet (300 mg total) by mouth daily. 05/12/23   Raulkar, Sven SQUIBB, MD  ALPRAZolam  (XANAX ) 0.5 MG tablet Take 1 tablet (0.5 mg total) by mouth 2 (two) times daily as needed. 05/12/23   Raulkar, Sven SQUIBB, MD  amLODipine  (NORVASC ) 10 MG tablet Take 10 mg by mouth daily. 07/20/23   [provider]  apixaban  (ELIQUIS ) 5 MG TABS tablet Take 1 tablet (5 mg total) by mouth 2 (two) times daily. 05/12/23   Raulkar, Sven SQUIBB, MD  atorvastatin  (LIPITOR) 40 MG tablet Take 1 tablet (40 mg total) by  mouth every evening. 05/12/23   Raulkar, Sven SQUIBB, MD  baclofen  (LIORESAL ) 10 MG tablet Take 1.5 tablets (15 mg total) by mouth 4 (four) times daily. X 1 week then increase to 20 mg QID- for spasticity with Tizanidine  10/19/23   Lovorn, Megan, MD  carvedilol  (COREG ) 25 MG tablet Take 1 tablet (25 mg total) by mouth in the morning and at bedtime. 05/12/23   Raulkar, Sven SQUIBB, MD  diclofenac  Sodium (VOLTAREN ) 1 % GEL Apply 4 g topically 4 (four) times daily. To bilateral knees 05/12/23   Raulkar, Sven SQUIBB, MD  doxazosin  (CARDURA ) 2 MG tablet Take 1 tablet (2 mg total) by mouth at bedtime. 05/12/23   Lorilee Sven SQUIBB, MD  DULoxetine  (CYMBALTA ) 30 MG capsule Take 1 capsule by mouth once daily 06/24/23   Raulkar, Krutika P, MD  furosemide  (LASIX ) 80 MG tablet Take 80 mg by mouth 2 (two) times daily.    [provider]  hydrALAZINE  (APRESOLINE ) 25 MG tablet Take 25 mg by mouth 3 (three) times daily.    [provider]  lidocaine  (LIDODERM ) 5 % Place 2 patches onto the skin daily at 10 pm. Remove &  Discard patch within 12 hours or as directed by MD 05/12/23   Raulkar, Sven SQUIBB, MD  nystatin-triamcinolone (MYCOLOG II) cream SMARTSIG:Topical Morning-Evening    [provider]  omeprazole (PRILOSEC) 40 MG capsule Take 40 mg by mouth daily. 07/02/23   [provider]  ondansetron  (ZOFRAN ) 4 MG tablet Take 1 tablet (4 mg total) by mouth every 8 (eight) hours as needed for nausea or vomiting. 01/14/24   Delores Shields A, DO  oxyCODONE -acetaminophen  (PERCOCET) 10-325 MG tablet Take 1 tablet by mouth 4 (four) times daily as needed for pain. 01/11/24   Debby Fidela CROME, NP  polyethylene glycol powder (GLYCOLAX /MIRALAX ) 17 GM/SCOOP powder Take 17 g by mouth daily. 05/12/23   Raulkar, Sven SQUIBB, MD  Potassium Chloride  ER 20 MEQ TBCR Take 1.5 tablets by mouth 2 (two) times daily. 07/17/23   [provider]  pramipexole  (MIRAPEX ) 0.5 MG tablet Take 1 tablet (0.5 mg total) by mouth in the morning and at bedtime. 05/12/23   Raulkar, Sven SQUIBB, MD  tamsulosin  (FLOMAX ) 0.4 MG CAPS capsule Take 1 capsule (0.4 mg total) by mouth at bedtime. 04/09/23   Love, Sharlet RAMAN, PA-C  tirzepatide  (MOUNJARO ) 12.5 MG/0.5ML Pen Inject 12.5 mg into the skin once a week. 01/14/24   Delores Shields A, DO  tiZANidine  (ZANAFLEX ) 4 MG tablet Start with 4 mg at bedtime x 4 days, then 4 mg BID x 4 days, then increase to 4 mg in AM and 8 mg at bedtime x 4 days, then 4 mg in AM, 4 mg in Afternoon and 8 mg at bedtime- for spasticity with baclofen  10/19/23   Lovorn, Megan, MD  topiramate  (TOPAMAX ) 25 MG tablet Take 1 tablet (25 mg total) by mouth 2 (two) times daily. 05/12/23   Raulkar, Sven SQUIBB, MD  valsartan -hydrochlorothiazide  (DIOVAN -HCT) 80-12.5 MG tablet Take 1 tablet by mouth daily. 12/16/23   Delores Shields A, DO  Vitamin D , Ergocalciferol , (DRISDOL ) 1.25 MG (50000 UNIT) CAPS capsule Take 1 capsule (50,000 Units total) by mouth every 14 (fourteen) days. 01/14/24   Delores Shields LABOR, DO    Physical Exam:  Vitals:    01/18/24 1522 01/18/24 1600 01/18/24 1700 01/18/24 1812  BP:  (!) 145/72 (!) 169/140   Pulse:  73 68   Resp:  16 10   Temp:  98 F (36.7 C)  TempSrc:    Oral  SpO2: 100% 100% 100%    Wt Readings from Last 3 Encounters:  01/14/24 (!) 144.2 kg  01/11/24 (!) 142.9 kg  12/16/23 (!) 142.9 kg   There is no height or weight on file to calculate BMI.  General: Obese built, not in obvious distress HENT: Normocephalic, No scleral pallor or icterus noted. Oral mucosa is moist.  Chest: Diminished breath sounds bilaterally.  No crackles or wheezes.  CVS: S1 &S2 heard. No murmur.  Regular rate and rhythm. Abdomen: Soft, tenderness around the abdomen on palpation,.  Obese abdomen bowel sounds are heard. No abdominal mass palpated Extremities: No cyanosis, clubbing.  Peripheral pulses are palpable. Psych: Alert, awake and oriented, normal mood CNS:  No cranial nerve deficits.  Weakness over the lower extremities. Skin: Warm and dry.  No rashes noted.  Labs on Admission:   CBC: Recent Labs  Lab 01/18/24 1423  WBC 14.6*  HGB 13.3  HCT 40.6  MCV 94.6  PLT 383    Basic Metabolic Panel: Recent Labs  Lab 01/18/24 1423  NA 137  K 3.8  CL 92*  CO2 27  GLUCOSE 152*  BUN 49*  CREATININE 3.02*  CALCIUM  10.5*    Liver Function Tests: Recent Labs  Lab 01/18/24 1423  AST 15  ALT 17  ALKPHOS 82  BILITOT 0.6  PROT 8.6*  ALBUMIN  3.6   Recent Labs  Lab 01/18/24 1423  LIPASE 28   No results for input(s): AMMONIA in the last 168 hours.  Cardiac Enzymes: Recent Labs  Lab 01/18/24 1423  CKTOTAL 26*    BNP (last 3 results) No results for input(s): BNP in the last 8760 hours.  ProBNP (last 3 results) No results for input(s): PROBNP in the last 8760 hours.  CBG: No results for input(s): GLUCAP in the last 168 hours.  Lipase     Component Value Date/Time   LIPASE 28 01/18/2024 1423     Urinalysis    Component Value Date/Time   COLORURINE AMBER (A)  01/18/2024 1724   APPEARANCEUR CLOUDY (A) 01/18/2024 1724   LABSPEC 1.009 01/18/2024 1724   PHURINE 6.0 01/18/2024 1724   GLUCOSEU NEGATIVE 01/18/2024 1724   HGBUR MODERATE (A) 01/18/2024 1724   BILIRUBINUR NEGATIVE 01/18/2024 1724   KETONESUR NEGATIVE 01/18/2024 1724   PROTEINUR 100 (A) 01/18/2024 1724   NITRITE NEGATIVE 01/18/2024 1724   LEUKOCYTESUR MODERATE (A) 01/18/2024 1724     Drugs of Abuse  No results found for: LABOPIA, COCAINSCRNUR, LABBENZ, AMPHETMU, THCU, LABBARB    Radiological Exams on Admission: CT ABDOMEN PELVIS WO CONTRAST Result Date: 01/18/2024 CLINICAL DATA:  Acute abdominal pain.  Hematuria. EXAM: CT ABDOMEN AND PELVIS WITHOUT CONTRAST TECHNIQUE: Multidetector CT imaging of the abdomen and pelvis was performed following the standard protocol without IV contrast. RADIATION DOSE REDUCTION: This exam was performed according to the departmental dose-optimization program which includes automated exposure control, adjustment of the mA and/or kV according to patient size and/or use of iterative reconstruction technique. COMPARISON:  CT abdomen and pelvis 05/21/2023 FINDINGS: Lower chest: No acute abnormality. Hepatobiliary: No focal liver abnormality is seen. No gallstones, gallbladder wall thickening, or biliary dilatation. Pancreas: Unremarkable. No pancreatic ductal dilatation or surrounding inflammatory changes. Spleen: Normal in size without focal abnormality. Adrenals/Urinary Tract: There is a cyst in the inferior pole the right kidney measuring 1.5 cm. There are additional subcentimeter hypodensities in both kidneys which are too small to characterize, likely cysts. No urinary  tract calculi are seen there is no hydronephrosis. Adrenal glands are within normal limits. There is subtle layering hyperdensity within the posterior bladder which Haub represent hemorrhage or infection. The bladder otherwise appears within normal limits. Stomach/Bowel: Stomach is within  normal limits. Appendix appears normal. No evidence of bowel wall thickening, distention, or inflammatory changes. There is sigmoid colon diverticulosis. Vascular/Lymphatic: Aortic atherosclerosis. No enlarged abdominal or pelvic lymph nodes. Reproductive: Prostate is unremarkable. Other: No abdominal wall hernia or abnormality. No abdominopelvic ascites. Musculoskeletal: There is severe degenerative changes and grade 1 anterolisthesis at T10-T11, unchanged. No acute fractures are seen. IMPRESSION: 1. Subtle layering hyperdensity within the posterior bladder Mccaffery represent hemorrhage or infection. Correlate with urinalysis. 2. No urinary tract calculi or hydronephrosis. 3. Sigmoid colon diverticulosis. Aortic Atherosclerosis (ICD10-I70.0). Electronically Signed   By: Greig Pique M.D.   On: 01/18/2024 17:06    EKG: Personally reviewed by me which shows sinus rhythm   Consultant: Verbal opinion with urology from ED  Code Status: Full code  Microbiology urine culture  Antibiotics: Rocephin  IV  Family Communication:  Patients' condition and plan of care including tests being ordered have been discussed with the patient and the patient's spouse at bedside who indicate understanding and agree with the plan.   Status is: Observation The patient remains OBS appropriate and will d/c before 2 midnights.   Severity of Illness: The appropriate patient status for this patient is OBSERVATION. Observation status is judged to be reasonable and necessary in order to provide the required intensity of service to ensure the patient's safety. The patient's presenting symptoms, physical exam findings, and initial radiographic and laboratory data in the context of their medical condition is felt to place them at decreased risk for further clinical deterioration. Furthermore, it is anticipated that the patient will be medically stable for discharge from the hospital within 2 midnights of admission.    Signed, Vernal Alstrom, MD [facility] 01/18/2024   ;a

## 2024-01-19 ENCOUNTER — Telehealth: Payer: Self-pay

## 2024-01-19 DIAGNOSIS — K219 Gastro-esophageal reflux disease without esophagitis: Secondary | ICD-10-CM | POA: Diagnosis present

## 2024-01-19 DIAGNOSIS — N3001 Acute cystitis with hematuria: Secondary | ICD-10-CM | POA: Diagnosis present

## 2024-01-19 DIAGNOSIS — E559 Vitamin D deficiency, unspecified: Secondary | ICD-10-CM | POA: Diagnosis present

## 2024-01-19 DIAGNOSIS — L89612 Pressure ulcer of right heel, stage 2: Secondary | ICD-10-CM | POA: Diagnosis present

## 2024-01-19 DIAGNOSIS — M549 Dorsalgia, unspecified: Secondary | ICD-10-CM | POA: Diagnosis present

## 2024-01-19 DIAGNOSIS — E785 Hyperlipidemia, unspecified: Secondary | ICD-10-CM | POA: Diagnosis present

## 2024-01-19 DIAGNOSIS — J449 Chronic obstructive pulmonary disease, unspecified: Secondary | ICD-10-CM | POA: Diagnosis present

## 2024-01-19 DIAGNOSIS — B9620 Unspecified Escherichia coli [E. coli] as the cause of diseases classified elsewhere: Secondary | ICD-10-CM | POA: Diagnosis present

## 2024-01-19 DIAGNOSIS — R310 Gross hematuria: Secondary | ICD-10-CM | POA: Diagnosis not present

## 2024-01-19 DIAGNOSIS — R319 Hematuria, unspecified: Secondary | ICD-10-CM | POA: Diagnosis present

## 2024-01-19 DIAGNOSIS — D6832 Hemorrhagic disorder due to extrinsic circulating anticoagulants: Secondary | ICD-10-CM | POA: Diagnosis present

## 2024-01-19 DIAGNOSIS — R5381 Other malaise: Secondary | ICD-10-CM | POA: Diagnosis present

## 2024-01-19 DIAGNOSIS — Z6841 Body Mass Index (BMI) 40.0 and over, adult: Secondary | ICD-10-CM | POA: Diagnosis not present

## 2024-01-19 DIAGNOSIS — E66813 Obesity, class 3: Secondary | ICD-10-CM | POA: Diagnosis present

## 2024-01-19 DIAGNOSIS — N1831 Chronic kidney disease, stage 3a: Secondary | ICD-10-CM | POA: Diagnosis present

## 2024-01-19 DIAGNOSIS — N179 Acute kidney failure, unspecified: Secondary | ICD-10-CM | POA: Diagnosis present

## 2024-01-19 DIAGNOSIS — E873 Alkalosis: Secondary | ICD-10-CM | POA: Diagnosis present

## 2024-01-19 DIAGNOSIS — H3530 Unspecified macular degeneration: Secondary | ICD-10-CM | POA: Diagnosis present

## 2024-01-19 DIAGNOSIS — E1122 Type 2 diabetes mellitus with diabetic chronic kidney disease: Secondary | ICD-10-CM | POA: Diagnosis present

## 2024-01-19 DIAGNOSIS — G9341 Metabolic encephalopathy: Secondary | ICD-10-CM | POA: Diagnosis not present

## 2024-01-19 DIAGNOSIS — M7989 Other specified soft tissue disorders: Secondary | ICD-10-CM | POA: Diagnosis not present

## 2024-01-19 DIAGNOSIS — G8929 Other chronic pain: Secondary | ICD-10-CM | POA: Diagnosis present

## 2024-01-19 DIAGNOSIS — G4733 Obstructive sleep apnea (adult) (pediatric): Secondary | ICD-10-CM | POA: Diagnosis present

## 2024-01-19 DIAGNOSIS — E876 Hypokalemia: Secondary | ICD-10-CM | POA: Diagnosis present

## 2024-01-19 DIAGNOSIS — F32A Depression, unspecified: Secondary | ICD-10-CM | POA: Diagnosis present

## 2024-01-19 DIAGNOSIS — Z7901 Long term (current) use of anticoagulants: Secondary | ICD-10-CM | POA: Diagnosis not present

## 2024-01-19 DIAGNOSIS — I129 Hypertensive chronic kidney disease with stage 1 through stage 4 chronic kidney disease, or unspecified chronic kidney disease: Secondary | ICD-10-CM | POA: Diagnosis present

## 2024-01-19 LAB — CBC
HCT: 36.6 % — ABNORMAL LOW (ref 39.0–52.0)
Hemoglobin: 12.1 g/dL — ABNORMAL LOW (ref 13.0–17.0)
MCH: 30.6 pg (ref 26.0–34.0)
MCHC: 33.1 g/dL (ref 30.0–36.0)
MCV: 92.7 fL (ref 80.0–100.0)
Platelets: 347 K/uL (ref 150–400)
RBC: 3.95 MIL/uL — ABNORMAL LOW (ref 4.22–5.81)
RDW: 15.4 % (ref 11.5–15.5)
WBC: 13.7 K/uL — ABNORMAL HIGH (ref 4.0–10.5)
nRBC: 0 % (ref 0.0–0.2)

## 2024-01-19 LAB — MAGNESIUM: Magnesium: 1.6 mg/dL — ABNORMAL LOW (ref 1.7–2.4)

## 2024-01-19 LAB — PROTIME-INR
INR: 1.3 — ABNORMAL HIGH (ref 0.8–1.2)
Prothrombin Time: 17.4 s — ABNORMAL HIGH (ref 11.4–15.2)

## 2024-01-19 LAB — COMPREHENSIVE METABOLIC PANEL WITH GFR
ALT: 15 U/L (ref 0–44)
AST: 13 U/L — ABNORMAL LOW (ref 15–41)
Albumin: 3.1 g/dL — ABNORMAL LOW (ref 3.5–5.0)
Alkaline Phosphatase: 75 U/L (ref 38–126)
Anion gap: 12 (ref 5–15)
BUN: 46 mg/dL — ABNORMAL HIGH (ref 6–20)
CO2: 32 mmol/L (ref 22–32)
Calcium: 9.8 mg/dL (ref 8.9–10.3)
Chloride: 93 mmol/L — ABNORMAL LOW (ref 98–111)
Creatinine, Ser: 2.68 mg/dL — ABNORMAL HIGH (ref 0.61–1.24)
GFR, Estimated: 26 mL/min — ABNORMAL LOW (ref >60)
Glucose, Bld: 117 mg/dL — ABNORMAL HIGH (ref 70–99)
Potassium: 3 mmol/L — ABNORMAL LOW (ref 3.5–5.1)
Sodium: 137 mmol/L (ref 135–145)
Total Bilirubin: 0.8 mg/dL (ref 0.0–1.2)
Total Protein: 7.7 g/dL (ref 6.5–8.1)

## 2024-01-19 LAB — HIV ANTIBODY (ROUTINE TESTING W REFLEX): HIV Screen 4th Generation wRfx: NONREACTIVE

## 2024-01-19 MED ORDER — POTASSIUM CHLORIDE 20 MEQ PO PACK
40.0000 meq | PACK | Freq: Once | ORAL | Status: AC
  Administered 2024-01-19: 40 meq via ORAL
  Filled 2024-01-19: qty 2

## 2024-01-19 MED ORDER — MORPHINE SULFATE (PF) 2 MG/ML IV SOLN: 1.0000 mg | Freq: Once | INTRAVENOUS | Status: DC

## 2024-01-19 MED ORDER — HYDROCODONE-ACETAMINOPHEN 5-325 MG PO TABS
2.0000 | ORAL_TABLET | Freq: Four times a day (QID) | ORAL | Status: DC | PRN
  Administered 2024-01-20 – 2024-01-21 (×2): 2 via ORAL
  Filled 2024-01-19 (×2): qty 2

## 2024-01-19 MED ORDER — MAGNESIUM SULFATE 2 GM/50ML IV SOLN
2.0000 g | Freq: Once | INTRAVENOUS | Status: AC
  Administered 2024-01-19: 2 g via INTRAVENOUS
  Filled 2024-01-19: qty 50

## 2024-01-19 MED ORDER — CYCLOBENZAPRINE HCL 5 MG PO TABS
5.0000 mg | ORAL_TABLET | Freq: Once | ORAL | Status: AC
  Administered 2024-01-19: 5 mg via ORAL
  Filled 2024-01-19: qty 1

## 2024-01-19 NOTE — Evaluation (Signed)
 Physical Therapy Evaluation Patient Details Name: Raymond [name] MRN: [id number] DOB: 08/10/1965 Today's Date: 01/19/2024  History of Present Illness  58 years old male presented to the hospital with complaints of 2 weeks of nausea vomiting and subsequently had a week of intermittent abdominal pain, with cloudy dark urine.  with past medical history of chronic kidney disease, COPD, depression, diabetes, history of diverticulosis, GI bleed, hyperlipidemia, hypertension, sleep apnea and chronic back pain, history of DVT in the past with ambulatory dysfunction  Clinical Impression  Patient presents with decreased mobility due to generalized weakness and decreased activity tolerance demonstrating rising BP with mobility and unable to stand long nor complete lateral scooting transfers while on EOB with +2 A. Typically able to perform slide board transfers with minimal help of wife to get to wheelchair at home.  Feel he will benefit from skilled PT in the acute setting and from post-acute inpatient rehab (>3 hours/day) to progress back to home with wife assist.       If plan is discharge home, recommend the following: Two people to help with walking and/or transfers;Help with stairs or ramp for entrance;A lot of help with bathing/dressing/bathroom   Can travel by private vehicle        Equipment Recommendations None recommended by PT  Recommendations for Other Services  Rehab consult    Functional Status Assessment Patient has had a recent decline in their functional status and demonstrates the ability to make significant improvements in function in a reasonable and predictable amount of time.     Precautions / Restrictions Precautions Precautions: Fall Recall of Precautions/Restrictions: Intact      Mobility  Bed Mobility Overal bed mobility: Needs Assistance Bed Mobility: Supine to Sit, Sit to Supine     Supine to sit: HOB elevated, Used rails, Mod assist, +2 for physical assistance Sit  to supine: Max assist, +2 for safety/equipment   General bed mobility comments: assist for trunk lifting with rails and assist for legs    Transfers Overall transfer level: Needs assistance Equipment used: Rolling walker (2 wheels) Transfers: Sit to/from Stand, Bed to chair/wheelchair/BSC            Lateral/Scoot Transfers: Mod assist, Max assist, +2 safety/equipment General transfer comment: total A x2 for partial stand at bedside, mod to max A x2 for lateral scoot at EOB (pt scooting some on his own though not effective with leaning posterior and no hip clearance)    Ambulation/Gait                  Stairs            Wheelchair Mobility     Tilt Bed    Modified Rankin (Stroke Patients Only)       Balance Overall balance assessment: Needs assistance   Sitting balance-Leahy Scale: Fair Sitting balance - Comments: static no UE support, leaning for transfers wtih UE support   Standing balance support: Bilateral upper extremity supported Standing balance-Leahy Scale: Zero Standing balance comment: total A x2 for partial stand                             Pertinent Vitals/Pain      Home Living Family/patient expects to be discharged to:: Private residence Living Arrangements: Spouse/significant other Available Help at Discharge: Family;Available 24 hours/day Type of Home: House Home Access: Ramped entrance       Home Layout: One level Home Equipment: Agricultural consultant (  2 wheels);BSC/3in1;Wheelchair - manual;Rollator (4 wheels);Wheelchair - power Additional Comments: Pt lives with wife who is available 24/7, has sister who can assist as well.    Prior Function Prior Level of Function : Needs assist             Mobility Comments: Pt slides into w/c, stands with RW for 3-4 mins at a time. ADLs Comments: Pt needs help with dressing/bathing     Extremity/Trunk Assessment   Upper Extremity Assessment Upper Extremity Assessment: Defer  to OT evaluation    Lower Extremity Assessment Lower Extremity Assessment: RLE deficits/detail;LLE deficits/detail RLE Deficits / Details: lifts antigravity but numbness and weakness noted as per pt though reports looking best they have in a while since elevated and edema improved LLE Deficits / Details: lifts antigravity but numbness and weakness noted as per pt though reports looking best they have in a while since elevated and edema improved       Communication   Communication Communication: No apparent difficulties    Cognition                                         Cueing       General Comments General comments (skin integrity, edema, etc.): BP supine 141/88, sitting 170/100, sitting after standing attempt 181/122, after return to supine 153/84.    Exercises     Assessment/Plan    PT Assessment Patient needs continued PT services  PT Problem List Decreased strength;Decreased activity tolerance;Decreased balance;Decreased mobility;Decreased knowledge of use of DME;Impaired sensation;Decreased safety awareness       PT Treatment Interventions DME instruction;Patient/family education;Functional mobility training    PT Goals (Current goals can be found in the Care Plan section)  Acute Rehab PT Goals Patient Stated Goal: return to home/prior function PT Goal Formulation: With patient Time For Goal Achievement: 02/02/24 Potential to Achieve Goals: Fair    Frequency Min 2X/week     Co-evaluation PT/OT/SLP Co-Evaluation/Treatment: Yes Reason for Co-Treatment: Complexity of the patient's impairments (multi-system involvement);For patient/therapist safety;To address functional/ADL transfers PT goals addressed during session: Mobility/safety with mobility;Balance OT goals addressed during session: ADL's and self-care;Proper use of Adaptive equipment and DME       AM-PAC PT 6 Clicks Mobility  Outcome Measure Help needed turning from your back to  your side while in a flat bed without using bedrails?: A Lot Help needed moving from lying on your back to sitting on the side of a flat bed without using bedrails?: Total Help needed moving to and from a bed to a chair (including a wheelchair)?: Total Help needed standing up from a chair using your arms (e.g., wheelchair or bedside chair)?: Total Help needed to walk in hospital room?: Total Help needed climbing 3-5 steps with a railing? : Total 6 Click Score: 7    End of Session Equipment Utilized During Treatment: Gait belt Activity Tolerance: Patient limited by fatigue Patient left: in bed;with call bell/phone within reach;with bed alarm set   PT Visit Diagnosis: Muscle weakness (generalized) (M62.81);Other abnormalities of gait and mobility (R26.89)    Time: 8747-8676 PT Time Calculation (min) (ACUTE ONLY): 31 min   Charges:   PT Evaluation $PT Eval Moderate Complexity: 1 Mod   PT General Charges $$ ACUTE PT VISIT: 1 Visit         Micheline Portal, PT Acute Rehabilitation Services [phone] 01/19/2024   Montie Portal 01/19/2024,  2:23 PM

## 2024-01-19 NOTE — Evaluation (Signed)
 Occupational Therapy Evaluation Patient Details Name: Raymond [name] MRN: [id number] DOB: [birth year] Today's Date: 01/19/2024   History of Present Illness   58 years old male presented to the hospital with complaints of 2 weeks of nausea vomiting and subsequently had a week of intermittent abdominal pain, with cloudy dark urine.  with past medical history of chronic kidney disease, COPD, depression, diabetes, history of diverticulosis, GI bleed, hyperlipidemia, hypertension, sleep apnea and chronic back pain, history of DVT in the past with ambulatory dysfunction     Clinical Impressions Pt c/o pain to R arm at IV site, and bottom, 7/10. Pt lives at home with wife who assists with all ADLs at bed and w/c level. Pt lateral scoots with sliding board into w/c, wife assists. Pt uses briefs at bed/wheelchair level for toileting. Pt goes to OP therapy 1X/wk, states he stands for 3-4 mins at baseline but cannot take steps. Pt currently significantly limited with standing and lateral scoots, total A x2 for partial stand and partial scoots on EOB. Pt min A for UB ADLs, max A for LB dressing/bathing. Pt max A x2 to assist to EOB.  Pt has good BUE strength, eager to participate, and tolerated therapy well, hopeful to quickly improve to baseline, recommending postacute intensive rehab >3hrs/day to maximize functional strength to complete transfers and ADLs to reduce caregiver burden, will continue to follow acutely to progress as able.      If plan is discharge home, recommend the following:   Two people to help with walking and/or transfers;A lot of help with bathing/dressing/bathroom;Assistance with cooking/housework;Assist for transportation;Help with stairs or ramp for entrance     Functional Status Assessment   Patient has had a recent decline in their functional status and demonstrates the ability to make significant improvements in function in a reasonable and predictable amount of time.      Equipment Recommendations   None recommended by OT     Recommendations for Other Services   Rehab consult     Precautions/Restrictions   Precautions Precautions: Fall Recall of Precautions/Restrictions: Intact Restrictions Weight Bearing Restrictions Per Provider Order: No     Mobility Bed Mobility Overal bed mobility: Needs Assistance Bed Mobility: Supine to Sit, Sit to Supine     Supine to sit: Max assist, +2 for physical assistance, Used rails, HOB elevated Sit to supine: Max assist, +2 for physical assistance   General bed mobility comments: max A x2 assist for in/out of bed, increased time    Transfers Overall transfer level: Needs assistance                 General transfer comment: total A x2 for partial stand at bedside, total A x2 for lateral scoot at EOB      Balance Overall balance assessment: Needs assistance Sitting-balance support: No upper extremity supported, Feet supported Sitting balance-Leahy Scale: Fair Sitting balance - Comments: sits statically EOB, difficulty leaning L/R or forward.     Standing balance-Leahy Scale: Zero Standing balance comment: total A x2 for partial stand                           ADL either performed or assessed with clinical judgement   ADL Overall ADL's : Needs assistance/impaired Eating/Feeding: Independent   Grooming: Set up;Sitting   Upper Body Bathing: Moderate assistance;Sitting   Lower Body Bathing: Maximal assistance;Sitting/lateral leans   Upper Body Dressing : Minimal assistance;Sitting   Lower Body Dressing: Maximal  assistance;Sitting/lateral leans;Bed level   Toilet Transfer: Total assistance   Toileting- Clothing Manipulation and Hygiene: Total assistance;Sitting/lateral lean;Bed level         General ADL Comments: Pt needs significant assistance at baseline, but is currently having increased difficulty with standing and lateral transfers. Pt able to sit EOB with max  A x2 and cannot stand without total A x2 assist. difficulty leaning L/R for LB dressing bu t is able to don socks at bed level.     Vision Baseline Vision/History: 0 No visual deficits Ability to See in Adequate Light: 0 Adequate       Perception         Praxis         Pertinent Vitals/Pain Pain Assessment Pain Assessment: 0-10 Pain Score: 9  Pain Location: bottom Pain Descriptors / Indicators: Aching, Discomfort Pain Intervention(s): Monitored during session     Extremity/Trunk Assessment Upper Extremity Assessment Upper Extremity Assessment: Overall WFL for tasks assessed   Lower Extremity Assessment Lower Extremity Assessment: Defer to PT evaluation       Communication Communication Communication: No apparent difficulties   Cognition Arousal: Alert Behavior During Therapy: WFL for tasks assessed/performed Cognition: No apparent impairments                               Following commands: Intact       Cueing  General Comments   Cueing Techniques: Verbal cues  BP noted to be high during session, supine BP 141/88, sitting EOB 170/100, sitting EOB after attempting to stand 181/122. 153/84 once returned to bed after activities.   Exercises     Shoulder Instructions      Home Living Family/patient expects to be discharged to:: Private residence Living Arrangements: Spouse/significant other Available Help at Discharge: Family;Available 24 hours/day Type of Home: House Home Access: Ramped entrance     Home Layout: One level     Bathroom Shower/Tub: Chief Strategy Officer: Handicapped height Bathroom Accessibility: Yes How Accessible: Accessible via wheelchair;Accessible via walker Home Equipment: Rolling Walker (2 wheels);BSC/3in1;Wheelchair - manual;Rollator (4 wheels);Wheelchair - power   Additional Comments: Pt lives with wife who is available 24/7, has sister who can assist as well.      Prior  Functioning/Environment Prior Level of Function : Needs assist             Mobility Comments: Pt slides into w/c, stands with RW for 3-4 mins at a time. ADLs Comments: Pt needs help with dressing/bathing    OT Problem List: Decreased strength;Decreased range of motion;Decreased activity tolerance;Impaired balance (sitting and/or standing);Pain;Obesity;Decreased safety awareness   OT Treatment/Interventions: Self-care/ADL training;Therapeutic exercise;Energy conservation;DME and/or AE instruction;Therapeutic activities;Patient/family education;Balance training      OT Goals(Current goals can be found in the care plan section)   Acute Rehab OT Goals Patient Stated Goal: to improve strength OT Goal Formulation: With patient Time For Goal Achievement: 02/02/24 Potential to Achieve Goals: Good   OT Frequency:  Min 2X/week    Co-evaluation PT/OT/SLP Co-Evaluation/Treatment: Yes Reason for Co-Treatment: Complexity of the patient's impairments (multi-system involvement);For patient/therapist safety;To address functional/ADL transfers   OT goals addressed during session: ADL's and self-care;Proper use of Adaptive equipment and DME      AM-PAC OT 6 Clicks Daily Activity     Outcome Measure Help from another person eating meals?: None Help from another person taking care of personal grooming?: A Little Help from another person toileting,  which includes using toliet, bedpan, or urinal?: A Lot Help from another person bathing (including washing, rinsing, drying)?: A Lot Help from another person to put on and taking off regular upper body clothing?: A Lot Help from another person to put on and taking off regular lower body clothing?: A Lot 6 Click Score: 15   End of Session Equipment Utilized During Treatment: Gait belt;Rolling walker (2 wheels) Nurse Communication: Mobility status  Activity Tolerance: Patient tolerated treatment well Patient left: in bed;with call bell/phone  within reach  OT Visit Diagnosis: Unsteadiness on feet (R26.81);Other abnormalities of gait and mobility (R26.89);Muscle weakness (generalized) (M62.81);Pain Pain - part of body:  (bottom)                Time: 8749-8665 OT Time Calculation (min): 44 min Charges:  OT General Charges $OT Visit: 1 Visit OT Evaluation $OT Eval Moderate Complexity: 1 Mod OT Treatments $Self Care/Home Management : 8-22 mins  [place], OTR/L   Elouise JONELLE Bott 01/19/2024, 1:53 PM

## 2024-01-19 NOTE — Progress Notes (Addendum)
 PROGRESS NOTE    Raymond [name]  FMW:[id number] DOB: 20-May-1966 DOA: 01/18/2024 PCP: [name], MD   Brief Narrative:   58 years old male with past medical history of chronic kidney disease, COPD, depression, diabetes, history of diverticulosis, GI bleed, hyperlipidemia, hypertension, sleep apnea and chronic back pain, history of DVT in the past with ambulatory dysfunction presented to the hospital with complaints of 2 weeks of nausea vomiting and subsequently had a week of intermittent abdominal pain. This Friday patient started seeing cloudy dark urine like [employer] with frequency urgency and dysuria. Urine is clearing up now, holding eliquis , no episodes of melena/hematochezia.   Assessment & Plan:  Principal Problem:   Hematuria Active Problems:   SOB (shortness of breath)   Benign essential HTN   Hyperlipidemia   OSA on CPAP   Moderate COPD (chronic obstructive pulmonary disease) (HCC)   Type 2 diabetes mellitus, without long-term current use of insulin  (HCC)   Chronic back pain   Cystitis   Hematuria/possible cystitis.[name] History of BPH.   ED provider communicated with Dr. Sherrilee with urology who recommended treatment with antibiotics for the next 48 hours to see if it will clear up if not urology consultation would be appropriate.    continue with IV Rocephin , hydration  and closely monitor.   Hold off with Eliquis  for now.   Will transfuse PRBC as necessary.  Bladder scan every 6 hours.  Follow urine cultures,  continue doxazosin      Shortness of breath likely secondary to restrictive lung disease moderate COPD and sleep apnea.    Continue with supportive care including bronchodilators, incentive spirometry.  CPAP at nighttime.   Acute kidney injury on CKD 3a.  Creatinine of 2,68 today dwon from  3.0 at this time. Baseline around 1.6-1.8.  continue with hydration.   Check BMP in AM.   Hold allopurinol  for now.   On Lasix  80 mg twice a day at home.  Will hold  Lasix  due to AKI.   Debility deconditioning wheelchair-bound status.  Has chronic back pain and is mostly immobile.  Goes to out patient PT continue his exercise program with swimming on Wednesdays and weights and using a recumbent bike on Thursday and Friday.  He is also working on balance and strength.    Essential hypertension  Continue amlodipine , hydralazine  and Coreg  from home.  Hold valsartan  and hydrochlorothiazide  for now.  H/o RLE DVT: Diagnosed on 03/25/23. Holding eliquis    Hyperlipidemia.  Continue Lipitor.   Lower extremity spasms.  On baclofen   Class III obesity: On Mounjaro  at home.  Vit D deficiency: He is on  prescription ergocalciferol  50,000 IU weekly   Disposition: Lives at home with his wife. Gets out patient PT. Being evaluated by CIR.   DVT prophylaxis: SCDs Start: 01/18/24 1946     Code Status: Full Code Family Communication:  Spoke to wife on the phone Status is: Inpatient    Subjective:  Urine is clearing and doesn't appear to have frank hematuria. He showed me his urinal containing urine. Denies any dark stools. Last dose of eliquis  was over the weekend.  Examination:  General exam: Appears calm and comfortable  Respiratory system: Clear to auscultation. Respiratory effort normal. Cardiovascular system: S1 & S2 heard, RRR. No JVD, murmurs, rubs, gallops or clicks. No pedal edema. Gastrointestinal system: Abdomen is nondistended, soft and nontender. No organomegaly or masses felt. Normal bowel sounds heard. Central nervous system: Alert and oriented. No focal neurological deficits. Extremities: Power 4/5  of LE and 5/5 of UE Skin: Slight redness/warmth over lower half of LLE Psychiatry: Judgement and insight appear normal. Mood & affect appropriate.       Diet Orders (From admission, onward)     Start     Ordered   01/18/24 1946  Diet Heart Room service appropriate? Yes; Fluid consistency: Thin  Diet effective now       Question Answer  Comment  Room service appropriate? Yes   Fluid consistency: Thin      01/18/24 1948            Objective: Vitals:   01/18/24 2357 01/19/24 0000 01/19/24 0500 01/19/24 0835  BP: (!) 153/87  (!) 152/93 (!) 153/75  Pulse: (!) 106 (!) 103 99 93  Resp: 16 16 16    Temp: 98.6 F (37 C)  (!) 97.5 F (36.4 C) 98.7 F (37.1 C)  TempSrc: Oral  Oral Oral  SpO2: 98% 97% 94% 91%  Weight:   (!) 138.4 kg   Height:        Intake/Output Summary (Last 24 hours) at 01/19/2024 0906 Last data filed at 01/18/2024 2159 Gross per 24 hour  Intake 1180 ml  Output 300 ml  Net 880 ml   Filed Weights   01/18/24 2129 01/19/24 0500  Weight: (!) 137.8 kg (!) 138.4 kg    Scheduled Meds:  amLODipine   10 mg Oral Daily   atorvastatin   40 mg Oral QPM   baclofen   15 mg Oral QID   carvedilol   25 mg Oral BID WC   doxazosin   2 mg Oral QHS   DULoxetine   30 mg Oral Daily   hydrALAZINE   25 mg Oral TID   lidocaine   2 patch Transdermal Q2200   pantoprazole   40 mg Oral BID AC   polyethylene glycol  17 g Oral Daily   pramipexole   0.5 mg Oral BID   sodium chloride  flush  3 mL Intravenous Q12H   [START ON 01/20/2024] Vitamin D  (Ergocalciferol )  50,000 Units Oral Q14 Days   Continuous Infusions:  sodium chloride  75 mL/hr at 01/18/24 2258   sodium chloride      cefTRIAXone  (ROCEPHIN )  IV 1 g (01/18/24 2029)   magnesium  sulfate bolus IVPB 2 g (01/19/24 0851)    Nutritional status     Body mass index is 42.57 kg/m.  Data Reviewed:   CBC: Recent Labs  Lab 01/18/24 1423 01/19/24 0319  WBC 14.6* 13.7*  HGB 13.3 12.1*  HCT 40.6 36.6*  MCV 94.6 92.7  PLT 383 347   Basic Metabolic Panel: Recent Labs  Lab 01/18/24 1423 01/19/24 0319  NA 137 137  K 3.8 3.0*  CL 92* 93*  CO2 27 32  GLUCOSE 152* 117*  BUN 49* 46*  CREATININE 3.02* 2.68*  CALCIUM  10.5* 9.8  MG  --  1.6*   GFR: Estimated Creatinine Clearance: 41.7 mL/min (A) (by C-G formula based on SCr of 2.68 mg/dL (H)). Liver Function  Tests: Recent Labs  Lab 01/18/24 1423 01/19/24 0319  AST 15 13*  ALT 17 15  ALKPHOS 82 75  BILITOT 0.6 0.8  PROT 8.6* 7.7  ALBUMIN  3.6 3.1*   Recent Labs  Lab 01/18/24 1423  LIPASE 28   No results for input(s): AMMONIA in the last 168 hours. Coagulation Profile: Recent Labs  Lab 01/19/24 0319  INR 1.3*   Cardiac Enzymes: Recent Labs  Lab 01/18/24 1423  CKTOTAL 26*   BNP (last 3 results) No results for input(s): PROBNP in  the last 8760 hours. HbA1C: No results for input(s): HGBA1C in the last 72 hours. CBG: No results for input(s): GLUCAP in the last 168 hours. Lipid Profile: No results for input(s): CHOL, HDL, LDLCALC, TRIG, CHOLHDL, LDLDIRECT in the last 72 hours. Thyroid  Function Tests: No results for input(s): TSH, T4TOTAL, FREET4, T3FREE, THYROIDAB in the last 72 hours. Anemia Panel: No results for input(s): VITAMINB12, FOLATE, FERRITIN, TIBC, IRON, RETICCTPCT in the last 72 hours. Sepsis Labs: No results for input(s): PROCALCITON, LATICACIDVEN in the last 168 hours.  No results found for this or any previous visit (from the past 240 hours).       Radiology Studies: CT ABDOMEN PELVIS WO CONTRAST Result Date: 01/18/2024 CLINICAL DATA:  Acute abdominal pain.  Hematuria. EXAM: CT ABDOMEN AND PELVIS WITHOUT CONTRAST TECHNIQUE: Multidetector CT imaging of the abdomen and pelvis was performed following the standard protocol without IV contrast. RADIATION DOSE REDUCTION: This exam was performed according to the departmental dose-optimization program which includes automated exposure control, adjustment of the mA and/or kV according to patient size and/or use of iterative reconstruction technique. COMPARISON:  CT abdomen and pelvis 05/21/2023 FINDINGS: Lower chest: No acute abnormality. Hepatobiliary: No focal liver abnormality is seen. No gallstones, gallbladder wall thickening, or biliary dilatation. Pancreas:  Unremarkable. No pancreatic ductal dilatation or surrounding inflammatory changes. Spleen: Normal in size without focal abnormality. Adrenals/Urinary Tract: There is a cyst in the inferior pole the right kidney measuring 1.5 cm. There are additional subcentimeter hypodensities in both kidneys which are too small to characterize, likely cysts. No urinary tract calculi are seen there is no hydronephrosis. Adrenal glands are within normal limits. There is subtle layering hyperdensity within the posterior bladder which Griep represent hemorrhage or infection. The bladder otherwise appears within normal limits. Stomach/Bowel: Stomach is within normal limits. Appendix appears normal. No evidence of bowel wall thickening, distention, or inflammatory changes. There is sigmoid colon diverticulosis. Vascular/Lymphatic: Aortic atherosclerosis. No enlarged abdominal or pelvic lymph nodes. Reproductive: Prostate is unremarkable. Other: No abdominal wall hernia or abnormality. No abdominopelvic ascites. Musculoskeletal: There is severe degenerative changes and grade 1 anterolisthesis at T10-T11, unchanged. No acute fractures are seen. IMPRESSION: 1. Subtle layering hyperdensity within the posterior bladder Tasker represent hemorrhage or infection. Correlate with urinalysis. 2. No urinary tract calculi or hydronephrosis. 3. Sigmoid colon diverticulosis. Aortic Atherosclerosis (ICD10-I70.0). Electronically Signed   By: Greig Pique M.D.   On: 01/18/2024 17:06        LOS: 0 days   Time spent= 40 mins    Deliliah Room, MD [facility]  If 7PM-7AM, please contact night-coverage  01/19/2024, 9:06 AM

## 2024-01-19 NOTE — Telephone Encounter (Signed)
 Started PA for [facility] 12.5mg  via covermymeds.

## 2024-01-19 NOTE — Plan of Care (Addendum)
 Pt was very confusion than the previous night, pt keep ripping off his gown and tele monitor off, my self along with the nurse tech Terrell was in and pt room constantly because pt tele monitor will be off, pt also pull out his IV line twice. I asked Ash a nurse on the floor that night if he can get another IV line for pt because pt was on Rocephin  which was due at 2200.  Pt kept ripping his CPAP off his face which cause the clip at the side to break, pt precede to now get up and sit at the side of the bed, pt was redirect back in the bed but will be up again pulling every off and out of him, his tele monitor, his IV and his pur wick. Once pt keep getting up and out bed and the pt safety is now more of a concern to me, I then reach out to the doctor on call. I let the doctor know about pt condition and what has been happening, the Dr on call prescribe pt Haloperidol  Lactate( Haldol ) 1 mg. Haldol  1 mg given to pt. Respiratory was call for a CPAP machine. Pt was then resting safely in bed.   Problem: Education: Goal: Knowledge of General Education information will improve Description: Including pain rating scale, medication(s)/side effects and non-pharmacologic comfort measures Outcome: Progressing   Problem: Health Behavior/Discharge Planning: Goal: Ability to manage health-related needs will improve Outcome: Progressing   Problem: Clinical Measurements: Goal: Ability to maintain clinical measurements within normal limits will improve Outcome: Progressing Goal: Will remain free from infection Outcome: Progressing Goal: Diagnostic test results will improve Outcome: Progressing Goal: Respiratory complications will improve Outcome: Progressing Goal: Cardiovascular complication will be avoided Outcome: Progressing   Problem: Activity: Goal: Risk for activity intolerance will decrease Outcome: Progressing   Problem: Nutrition: Goal: Adequate nutrition will be maintained Outcome: Progressing    Problem: Coping: Goal: Level of anxiety will decrease Outcome: Progressing   Problem: Elimination: Goal: Will not experience complications related to bowel motility Outcome: Progressing Goal: Will not experience complications related to urinary retention Outcome: Progressing   Problem: Pain Managment: Goal: General experience of comfort will improve and/or be controlled Outcome: Progressing   Problem: Safety: Goal: Ability to remain free from injury will improve Outcome: Progressing   Problem: Skin Integrity: Goal: Risk for impaired skin integrity will decrease Outcome: Progressing

## 2024-01-19 NOTE — TOC CM/SW Note (Signed)
 Patient Goals and CMS Choice    CSW completed assessment through chart review. Pt is from home with spouse. Admitted due to Hematuria. Pt has a hx of home health with [facility]. PT/OT eval pending. No other inpatient case management needs identified.   Case management team will continue to follow in event pt has needs for home pending therapy evaluation.     Expected Discharge Plan and Services                                                Prior Living Arrangements/Services                       Activities of Daily Living   ADL Screening (condition at time of admission) Independently performs ADLs?: No Does the patient have a NEW difficulty with bathing/dressing/toileting/self-feeding that is expected to last >3 days?: No Does the patient have a NEW difficulty with getting in/out of bed, walking, or climbing stairs that is expected to last >3 days?: No Does the patient have a NEW difficulty with communication that is expected to last >3 days?: No Is the patient deaf or have difficulty hearing?: No Does the patient have difficulty seeing, even when wearing glasses/contacts?: No Does the patient have difficulty concentrating, remembering, or making decisions?: No  Permission Sought/Granted                  Emotional Assessment              Admission diagnosis:  Gross hematuria [R31.0] AKI (acute kidney injury) (HCC) [N17.9] Hematuria [R31.9] Patient Active Problem List   Diagnosis Date Noted   Hematuria 01/18/2024   Chronic incomplete paraplegia (HCC) 10/19/2023   Spasticity 10/19/2023   Wheelchair dependence 10/19/2023   Cystitis 05/21/2023   Acute diverticulitis 05/21/2023   Vitamin D  deficiency 05/14/2023   Low HDL (under 40) 05/14/2023   GERD (gastroesophageal reflux disease) 04/13/2023   Constipation 04/13/2023   Chronic back pain 04/13/2023   DVT, bilateral lower limbs (HCC) 04/13/2023   Coping style affecting medical  condition 03/27/2023   Thoracic myelopathy with LE weakness 03/23/2023   HNP (herniated nucleus pulposus with myelopathy), thoracic 03/12/2023   Hyperlipidemia 12/24/2022   Failure to thrive  in adult 12/24/2022   Tobacco abuse 03/18/2018   BPH associated with nocturia 03/11/2017   History of substance abuse (HCC) 03/11/2017   Restless leg syndrome 03/11/2017   Restrictive lung disease 05/14/2016   OSA on CPAP 05/14/2016   Moderate COPD (chronic obstructive pulmonary disease) (HCC) 05/14/2016   Smoking greater than 40 pack years 05/14/2016   SOB (shortness of breath) 10/06/2014   Edema of extremities 10/06/2014   Benign essential HTN 10/06/2014   Type 2 diabetes mellitus, without long-term current use of insulin  (HCC) 07/18/2011   PCP:  [name], MD Pharmacy:   [facility] [address], [place] - [address] [place] [address] Phone: [phone] Fax: [phone]  [facility] [address] [place] [address] Phone: [phone] Fax: [phone]     Social Determinants of Health (SDOH) Interventions    Readmission Risk Interventions    12/25/2022    4:24 PM  Readmission Risk Prevention Plan  Post Dischage Appt Complete  Medication Screening Complete  Transportation Screening Complete

## 2024-01-19 NOTE — Progress Notes (Signed)
   Inpatient Rehab Admissions Coordinator :  Per therapy recommendations, patient was screened for CIR candidacy by Heron Leavell RN MSN.  At this time patient appears to be a potential candidate for CIR. Noted sees Dr Cornelio in the outpatient PM&R clinic. I will place a rehab consult per protocol for full assessment. Please call me with any questions.  Heron Leavell RN MSN Admissions Coordinator [phone]

## 2024-01-20 ENCOUNTER — Inpatient Hospital Stay (HOSPITAL_COMMUNITY)

## 2024-01-20 ENCOUNTER — Ambulatory Visit: Admitting: Physical Therapy

## 2024-01-20 DIAGNOSIS — R310 Gross hematuria: Secondary | ICD-10-CM | POA: Diagnosis not present

## 2024-01-20 DIAGNOSIS — M7989 Other specified soft tissue disorders: Secondary | ICD-10-CM

## 2024-01-20 LAB — BLOOD GAS, VENOUS
Acid-Base Excess: 16.2 mmol/L — ABNORMAL HIGH (ref 0.0–2.0)
Bicarbonate: 41 mmol/L — ABNORMAL HIGH (ref 20.0–28.0)
Drawn by: 737341
O2 Saturation: 92.5 %
Patient temperature: 36.4
pCO2, Ven: 47 mmHg (ref 44–60)
pH, Ven: 7.55 — ABNORMAL HIGH (ref 7.25–7.43)
pO2, Ven: 57 mmHg — ABNORMAL HIGH (ref 32–45)

## 2024-01-20 LAB — URINE CULTURE: Culture: 40000 — AB

## 2024-01-20 LAB — CBC
HCT: 39 % (ref 39.0–52.0)
Hemoglobin: 13 g/dL (ref 13.0–17.0)
MCH: 30.6 pg (ref 26.0–34.0)
MCHC: 33.3 g/dL (ref 30.0–36.0)
MCV: 91.8 fL (ref 80.0–100.0)
Platelets: 371 K/uL (ref 150–400)
RBC: 4.25 MIL/uL (ref 4.22–5.81)
RDW: 15.2 % (ref 11.5–15.5)
WBC: 11.4 K/uL — ABNORMAL HIGH (ref 4.0–10.5)
nRBC: 0 % (ref 0.0–0.2)

## 2024-01-20 LAB — BASIC METABOLIC PANEL WITH GFR
Anion gap: 18 — ABNORMAL HIGH (ref 5–15)
BUN: 48 mg/dL — ABNORMAL HIGH (ref 6–20)
CO2: 31 mmol/L (ref 22–32)
Calcium: 10.3 mg/dL (ref 8.9–10.3)
Chloride: 90 mmol/L — ABNORMAL LOW (ref 98–111)
Creatinine, Ser: 2.71 mg/dL — ABNORMAL HIGH (ref 0.61–1.24)
GFR, Estimated: 26 mL/min — ABNORMAL LOW (ref >60)
Glucose, Bld: 122 mg/dL — ABNORMAL HIGH (ref 70–99)
Potassium: 2.9 mmol/L — ABNORMAL LOW (ref 3.5–5.1)
Sodium: 139 mmol/L (ref 135–145)

## 2024-01-20 MED ORDER — HALOPERIDOL LACTATE 5 MG/ML IJ SOLN
1.0000 mg | Freq: Four times a day (QID) | INTRAMUSCULAR | Status: DC | PRN
  Administered 2024-01-20: 1 mg via INTRAVENOUS
  Filled 2024-01-20: qty 1

## 2024-01-20 MED ORDER — POTASSIUM CHLORIDE CRYS ER 20 MEQ PO TBCR
40.0000 meq | EXTENDED_RELEASE_TABLET | ORAL | Status: AC
  Administered 2024-01-20: 40 meq via ORAL
  Filled 2024-01-20 (×2): qty 2

## 2024-01-20 MED ORDER — BACLOFEN 10 MG PO TABS
15.0000 mg | ORAL_TABLET | Freq: Four times a day (QID) | ORAL | Status: DC | PRN
  Administered 2024-01-20 – 2024-01-21 (×2): 15 mg via ORAL
  Filled 2024-01-20 (×2): qty 2

## 2024-01-20 MED ORDER — POTASSIUM CHLORIDE 10 MEQ/100ML IV SOLN
10.0000 meq | Freq: Once | INTRAVENOUS | Status: AC
  Administered 2024-01-20: 10 meq via INTRAVENOUS
  Filled 2024-01-20: qty 100

## 2024-01-20 MED ORDER — POTASSIUM CHLORIDE 10 MEQ/100ML IV SOLN
10.0000 meq | INTRAVENOUS | Status: AC
  Administered 2024-01-20 (×3): 10 meq via INTRAVENOUS
  Filled 2024-01-20 (×3): qty 100

## 2024-01-20 NOTE — Progress Notes (Signed)
 Pt has a lot of confusion. Pt is arousable most times but sometimes you have to yell and sternal rub him for him to come to. He is able to tell me his full name, DOB, and the year. He knows he is in the hospital but doesn't know which one or why. He does not know what month or day it is. I spoke with his wife over the phone she said he gets confused on and off which is his normal but not being difficult to arouse. This nurse messaged Dr Arlice to inquire when or if pt has been seen this morning. Dr. Arlice advised pt was seen and was partially oriented and to monitor for now. Pt later became less arousable, now making jerking movements. Family now present concerned and advised pt was not in this state yesterday. Asked for ABPG due to pt sleeping off and on with CPAP on and off. Dr advised will be here shortly to assess.

## 2024-01-20 NOTE — Progress Notes (Signed)
 PT Cancellation Note  Patient Details Name: Raymond [name] MRN: [id number] DOB: Wakeley 05, 1967   Cancelled Treatment:    Reason Eval/Treat Not Completed: Medical issues which prohibited therapy; patient with decreased level of arousal per RN and on bipap to get blood gases.  Will follow up .    Montie Portal 01/20/2024, 4:30 PM Micheline Portal, PT Acute Rehabilitation Services Office:[phone] 01/20/2024

## 2024-01-20 NOTE — Plan of Care (Signed)

## 2024-01-20 NOTE — Significant Event (Signed)
 Patient was seen and examined this afternoon. Somnolent. On CPAP. Wife and sister at bedside. Very concerned about his somnolence. They believe it is d/t 1 mg IV Haldol  he was given at 3 am this morning. They asked why oral xanax  wasn't tried first. I donot see a documentation if he was not cooperative for oral med. I had a long conversation with family. Patient is able to open eyes after few attempts. Said, 'I'm fine.' For now, I have discontinued Haldol  order. I have also changed scheduled Baclofen  to PRN. Serum bicarb level seems to be slightly trending up. Obtain VBG to r/o CO2 retention.  D/w bedside RN.

## 2024-01-20 NOTE — Progress Notes (Signed)
 PROGRESS NOTE  Raymond [name]  DOB: 11-Aug-1965  PCP: [name], MD FMW:[id number]  DOA: 01/18/2024  LOS: 1 day  Hospital Day: 3  Brief narrative: Raymond [name] is a 58 y.o. male with PMH significant for morbid obesity, OSA on CPAP, DM2, HTN, HLD, CKD, COPD, DVT on anticoagulation, diverticulosis, GI bleed, chronic back pain, ambulatory dysfunction. 8/18, presented to ED with complaint of 2 weeks of, nausea, vomiting and 3 days of dark cloudy cola colored urine with frequency and urgency. At baseline, has limited ambulation, mostly wheelchair-bound.  On Eliquis  for chronic DVT.  In the ED, hemodynamically stable Initial labs with WBC 14.6, hemoglobin 13.3, creatinine elevated to 3 against a baseline of 2. Urinalysis showed cloudy amber color urine with moderate hemoglobin, moderate leukocytes CT abdomen showed subtle hyperdensity within the posterior bladder suggestive of hemorrhage versus infection, no urinary tract calculi or hydronephrosis. ED provider communicated with Dr. Sherrilee with urology who recommended treatment with antibiotics for 48 hours to see if it will clear up the urine if not urology consultation would be appropriate.    Started on IV Rocephin  Urine culture sent on admission grew 40,000 CFU per mL of E. Coli. In the next 24 hours since admission, urine cleared up.  Subjective: Patient was seen and examined this morning. Pleasant middle-aged Caucasian male.  Looks older for his age. Per RN, earlier today patient was confused and disoriented this morning. At the time of my evaluation, patient was able to open his eyes on command.  Oriented to place and person.  Knew it was the year of 2025.  Fell asleep in the middle of conversation again. Afebrile, hemodynamically stable Last set of labs from 8/19 showed WBC count of 13.7, potassium 3, creatinine 2.68, magnesium  1.6  Assessment and plan: Acute UTI with hematuria h/o BPH   Presented with abdominal pain,  nausea, vomiting urinary frequency, urgency and hematuria  Urinalysis as above. Mailings without calculi, hydronephrosis or mass Urine culture grew only 40,000 CFU per mL of E. coli but given significant symptoms, treated as true UTI UTI symptoms and hematuria improving with IV Rocephin  WBC count improving as well. Trend temperature and WBC count Eliquis  is currently on hold.  Hemoglobin stable Doxazosin  for BPH Recent Labs  Lab 01/18/24 1423 01/19/24 0319 01/20/24 0932  WBC 14.6* 13.7* 11.4*   AKI on CKD 3a Baseline creatinine less than 2.  Presented with creatinine elevated to 3.02.  Gradually improved but plateaued at 2.7 today. Continue to monitor Lasix  currently on hold Recent Labs    05/13/23 1057 05/20/23 2117 05/21/23 1628 05/22/23 0610 05/23/23 0304 05/24/23 0448 05/25/23 0427 01/18/24 1423 01/19/24 0319 01/20/24 0932  BUN 20 27* 19 17 15 17 18  49* 46* 48*  CREATININE 1.36* 2.00* 1.75* 1.85* 1.59* 1.85* 1.87* 3.02* 2.68* 2.71*   Hypokalemia/hypomagnesemia Potassium and magnesium  were replaced yesterday.   Repeat labs today with potassium low at 2.9.  Replacement ordered Recent Labs  Lab 01/18/24 1423 01/19/24 0319 01/20/24 0932  K 3.8 3.0* 2.9*  MG  --  1.6*  --    Acute metabolic encephalopathy Intermittent confusion and disorientation per history.  Mental status seems to be at baseline this morning although not completely oriented.  Continue to monitor  Hypertension Currently blood pressure controlled on carvedilol , amlodipine  doxazosin , hydralazine .  On hold her valsartan , HCTZ and Lasix  because of AKI  Type 2 diabetes mellitus A1c 6 on 03/12/2023 PTA meds-none  HLD Continue Lipitor   H/o COPD, sleep  apnea Respiratory status stable.   Continue with supportive care including bronchodilators, incentive spirometry.  CPAP at nighttime.  Morbid Obesity  Body mass index is 42.83 kg/m. Patient has been advised to make an attempt to improve diet  and exercise patterns to aid in weight loss. On Mounjaro  weekly  Chronic back pain  Impaired mobility Mostly wheelchair-bound status. Goes to outpatient PT Seen by PT.  CIR recommended Pain regimen --- Scheduled: Baclofen , Cymbalta , --- PRN: Norco,   RLE DVT - 03/25/2023  On Eliquis  for the last 10 months.  Held on admission due to hematuria. On chart review I noted that patient had ultrasound duplex lower extremities done multiple times in the past probably due to chronic leg swelling leading to suspicion of DVTs.  Prior imagings before 03/22/2023 were negative. Will repeat US  DVT.  If negative, will not resume Eliquis   Vit D deficiency ergocalciferol  50,000 IU weekly     Impaired mobility  Seen by PT.  CIR was recommended but family has made a decision to take him home at discharge.  Goals of care   Code Status: Full Code     DVT prophylaxis:  SCDs Start: 01/18/24 1946   Antimicrobials: IV Rocephin  Fluid: None Consultants: None currently Family Communication: None at bedside  Status: Inpatient Level of care:  Telemetry Medical   Patient is from: Home Needs to continue in-hospital care: Repeat labs tomorrow, pending ultrasound duplex of lower extremity Anticipated d/c to: Hopefully home in 1 to 2 days    Diet:  Diet Order             Diet Heart Room service appropriate? Yes; Fluid consistency: Thin  Diet effective now                   Scheduled Meds:  amLODipine   10 mg Oral Daily   atorvastatin   40 mg Oral QPM   baclofen   15 mg Oral QID   carvedilol   25 mg Oral BID WC   doxazosin   2 mg Oral QHS   DULoxetine   30 mg Oral Daily   hydrALAZINE   25 mg Oral TID   lidocaine   2 patch Transdermal Q2200   pantoprazole   40 mg Oral BID AC   polyethylene glycol  17 g Oral Daily   potassium chloride   40 mEq Oral Q2H   pramipexole   0.5 mg Oral BID   sodium chloride  flush  3 mL Intravenous Q12H   Vitamin D  (Ergocalciferol )  50,000 Units Oral Q14 Days     PRN meds: acetaminophen  **OR** acetaminophen , albuterol , ALPRAZolam , haloperidol  lactate, hydrALAZINE , HYDROcodone -acetaminophen , ondansetron  **OR** ondansetron  (ZOFRAN ) IV, polyethylene glycol, sodium chloride  flush   Infusions:   cefTRIAXone  (ROCEPHIN )  IV 1 g (01/19/24 2018)    Antimicrobials: Anti-infectives (From admission, onward)    Start     Dose/Rate Route Frequency Ordered Stop   01/18/24 2000  cefTRIAXone  (ROCEPHIN ) 1 g in sodium chloride  0.9 % 100 mL IVPB        1 g 200 mL/hr over 30 Minutes Intravenous Every 24 hours 01/18/24 1958         Objective: Vitals:   01/20/24 0739 01/20/24 1143  BP: 119/63 133/69  Pulse: 63 78  Resp:    Temp: 98.6 F (37 C) 98.8 F (37.1 C)  SpO2: 94% 93%    Intake/Output Summary (Last 24 hours) at 01/20/2024 1349 Last data filed at 01/20/2024 1020 Gross per 24 hour  Intake 813.83 ml  Output 900 ml  Net -86.17 ml  Filed Weights   01/18/24 2129 01/19/24 0500 01/20/24 0500  Weight: (!) 137.8 kg (!) 138.4 kg (!) 139.3 kg   Weight change: 1.5 kg Body mass index is 42.83 kg/m.   Physical Exam: General exam: Pleasant, middle-aged Caucasian male.  Looks older for his age Skin: No rashes, lesions or ulcers. HEENT: Atraumatic, normocephalic, no obvious bleeding Lungs: Clear to auscultation bilaterally,  CVS: S1, S2, no murmur,   GI/Abd: Soft, nontender, nondistended, bowel sound present,   CNS: Somnolent, opens eyes to command. Oriented to place and person.  Knew it was the year of 2025.  Fell asleep in the middle of conversation again. Psychiatry: Mood appropriate Extremities: No pedal edema, no calf tenderness  Data Review: I have personally reviewed the laboratory data and studies available.  F/u labs  Unresulted Labs (From admission, onward)     Start     Ordered   Unscheduled  Occult blood card to lab, stool  As needed,   R      01/18/24 2013   Unscheduled  CBC with Differential/Platelet  Tomorrow morning,   R         01/20/24 1349   Unscheduled  Basic metabolic panel with GFR  Tomorrow morning,   R        01/20/24 1349            Signed, Chapman Rota, MD [facility] 01/20/2024

## 2024-01-20 NOTE — Progress Notes (Signed)
 Inpatient Rehab Admissions Coordinator:    I spoke with pt. Regarding potential CIR admit. He is insistent that he wants to go home instead. He is pretty confused this morning, so I called and spoke with his wife. She states that when he's in his right mind he is consistent that he wants home discharge no matter how much assist he needs. Wife states that she does not really want to take him home requiring heavy assist but will if she has to. She does not want to pursue CIR admit. I will sign off and notify TOC.   Leita Kleine, MS, CCC-SLP Rehab Admissions Coordinator  [phone] (celll) [phone] (office)

## 2024-01-20 NOTE — Progress Notes (Signed)
 Venous duplex lower ext  has been completed. Refer to [facility] under chart review to view preliminary results.   01/20/2024  4:11 PM Angelina Venard, Ricka BIRCH

## 2024-01-20 NOTE — Progress Notes (Signed)
 IVT consult placed for new PIV. Upon arrival, patient was difficult to arouse from sleep with sternal rub or vigorous shaking. After patient was awake, confusion was noted. Oriented to self but not to place, time or situation. He didn't know he was hospitalized or why; he remained lethargic, and had upper and lower extremity spasms. Had to redirect him numerous times to hold still and remind him that he was in the hospital. Requested his primary RN to come to the bedside to determine how much of his behavior was baseline and if any was new. Nya noted that he was confused per the report given to her but wasn't sure about current behavior.   Ultimately, PIV was placed with US . Patient tolerated placement well but required redirection throughout the entire process. Secure chat was sent to MD (Dr. Arlice) with assessment and concerns. He noted he'd re-assess during rounds.   Syvilla Martin R Eldred Lievanos, RN

## 2024-01-21 DIAGNOSIS — R310 Gross hematuria: Secondary | ICD-10-CM | POA: Diagnosis not present

## 2024-01-21 LAB — BASIC METABOLIC PANEL WITH GFR
Anion gap: 15 (ref 5–15)
BUN: 47 mg/dL — ABNORMAL HIGH (ref 6–20)
CO2: 31 mmol/L (ref 22–32)
Calcium: 9.8 mg/dL (ref 8.9–10.3)
Chloride: 93 mmol/L — ABNORMAL LOW (ref 98–111)
Creatinine, Ser: 2.82 mg/dL — ABNORMAL HIGH (ref 0.61–1.24)
GFR, Estimated: 25 mL/min — ABNORMAL LOW (ref >60)
Glucose, Bld: 138 mg/dL — ABNORMAL HIGH (ref 70–99)
Potassium: 3.2 mmol/L — ABNORMAL LOW (ref 3.5–5.1)
Sodium: 139 mmol/L (ref 135–145)

## 2024-01-21 LAB — CBC WITH DIFFERENTIAL/PLATELET
Abs Immature Granulocytes: 0.27 K/uL — ABNORMAL HIGH (ref 0.00–0.07)
Basophils Absolute: 0.1 K/uL (ref 0.0–0.1)
Basophils Relative: 1 %
Eosinophils Absolute: 0.3 K/uL (ref 0.0–0.5)
Eosinophils Relative: 3 %
HCT: 37 % — ABNORMAL LOW (ref 39.0–52.0)
Hemoglobin: 12.2 g/dL — ABNORMAL LOW (ref 13.0–17.0)
Immature Granulocytes: 3 %
Lymphocytes Relative: 21 %
Lymphs Abs: 2.1 K/uL (ref 0.7–4.0)
MCH: 30.5 pg (ref 26.0–34.0)
MCHC: 33 g/dL (ref 30.0–36.0)
MCV: 92.5 fL (ref 80.0–100.0)
Monocytes Absolute: 1.3 K/uL — ABNORMAL HIGH (ref 0.1–1.0)
Monocytes Relative: 13 %
Neutro Abs: 5.9 K/uL (ref 1.7–7.7)
Neutrophils Relative %: 59 %
Platelets: 362 K/uL (ref 150–400)
RBC: 4 MIL/uL — ABNORMAL LOW (ref 4.22–5.81)
RDW: 15.3 % (ref 11.5–15.5)
WBC: 10 K/uL (ref 4.0–10.5)
nRBC: 0 % (ref 0.0–0.2)

## 2024-01-21 MED ORDER — OXYCODONE-ACETAMINOPHEN 5-325 MG PO TABS
1.0000 | ORAL_TABLET | Freq: Four times a day (QID) | ORAL | Status: DC | PRN
  Administered 2024-01-21 – 2024-01-22 (×3): 1 via ORAL
  Filled 2024-01-21 (×3): qty 1

## 2024-01-21 MED ORDER — OXYCODONE HCL 5 MG PO TABS
5.0000 mg | ORAL_TABLET | Freq: Four times a day (QID) | ORAL | Status: DC | PRN
  Administered 2024-01-21 (×2): 5 mg via ORAL
  Filled 2024-01-21 (×2): qty 1

## 2024-01-21 MED ORDER — POTASSIUM CHLORIDE CRYS ER 20 MEQ PO TBCR
40.0000 meq | EXTENDED_RELEASE_TABLET | Freq: Once | ORAL | Status: AC
  Administered 2024-01-21: 40 meq via ORAL
  Filled 2024-01-21: qty 2

## 2024-01-21 MED ORDER — SODIUM CHLORIDE 0.9 % IV SOLN: INTRAVENOUS | Status: DC

## 2024-01-21 MED ORDER — SODIUM CHLORIDE 0.9 % IV SOLN: INTRAVENOUS | Status: AC

## 2024-01-21 MED ORDER — OXYCODONE-ACETAMINOPHEN 10-325 MG PO TABS: 1.0000 | ORAL_TABLET | Freq: Four times a day (QID) | ORAL | Status: DC | PRN

## 2024-01-21 NOTE — Progress Notes (Signed)
 Physical Therapy Treatment Patient Details Name: Raymond [name] MRN: [id number] DOB: 01/13/1966 Today's Date: 01/21/2024   History of Present Illness 58 years old male presented to the hospital with complaints of 2 weeks of nausea vomiting and subsequently had a week of intermittent abdominal pain, with cloudy dark urine. Past medical history of chronic kidney disease, COPD, depression, diabetes, history of diverticulosis, GI bleed, hyperlipidemia, hypertension, sleep apnea and chronic back pain, history of DVT in the past with ambulatory dysfunction.    PT Comments  Pt received in supine, family present, pt agreeable to therapy session and motivated to work on OOB to wheelchair transfers and wheelchair mobiltiy. Pt reports 5/10 modified RPE (fatigue) after propelling wheelchair household distance using both arms and legs and no recliner available in his room during session, so pt transferred back to bed from [facility] at end of session, after damp bed linens removed/clean linens placed. Emphasis on slide board for ease of transfers to/from bed<>drop arm wheelchair via lateral scoot, with pt needing up to modA +2 scoot/stabilizing assist when transferring to slightly higher bed surface. Patient will benefit from intensive inpatient follow-up therapy, >3 hours/day, however per chart review pt Majeed refuse and go home with HHPT Pt would benefit from caregiver instruction on guarding positions with slide board transfers next session in case pt DC home before or over weekend.    If plan is discharge home, recommend the following: Two people to help with walking and/or transfers;Help with stairs or ramp for entrance;A lot of help with bathing/dressing/bathroom   Can travel by private vehicle        Equipment Recommendations  None recommended by PT    Recommendations for Other Services       Precautions / Restrictions Precautions Precautions: Fall Recall of Precautions/Restrictions:  Intact Precaution/Restrictions Comments: Contact precs Restrictions Weight Bearing Restrictions Per Provider Order: No     Mobility  Bed Mobility Overal bed mobility: Needs Assistance Bed Mobility: Supine to Sit, Sit to Supine     Supine to sit: HOB elevated, Used rails, Min assist Sit to supine: Min assist   General bed mobility comments: assistance with scooting towards HOB and with trunk to return to supine    Transfers Overall transfer level: Needs assistance Equipment used: Sliding board Transfers: Bed to chair/wheelchair/BSC            Lateral/Scoot Transfers: Min assist, Mod assist, +2 physical assistance, With slide board General transfer comment: min to CGA +2 for transfer from EOB to wheelchair with sliding board from much higher to lower surface. Mod assist +2 for wheelchair to EOB, low to high surface using slide board. Assistance with board setup, frequent cues to avoid posterior lean while scooting. Shoes donned for transfers which seemed to help.    Ambulation/Gait                   Psychologist, counselling mobility: Yes Wheelchair propulsion: Both upper extremities, Both lower extermities Wheelchair parts: Supervision/cueing Distance: 175 Wheelchair Assistance Details (indicate cue type and reason): Min cues and assist for IV mgmt and directional cues. No leg rests in [facility] but pt able to use BLE to propel at times vs lifting up legs. Pt reports 5/10 modified RPE (fatigue) at end of session.   Tilt Bed    Modified Rankin (Stroke Patients Only)       Balance Overall balance assessment: Needs assistance Sitting-balance  support: No upper extremity supported, Feet supported Sitting balance-Leahy Scale: Fair Sitting balance - Comments: EOB Postural control: Posterior lean                                  Communication Communication Communication: No apparent difficulties   Cognition Arousal: Alert Behavior During Therapy: WFL for tasks assessed/performed   PT - Cognitive impairments: Safety/Judgement, Problem solving, Sequencing                         Following commands: Intact      Cueing    Exercises      General Comments General comments (skin integrity, edema, etc.): pt with foam dressings over his heels; heels floated once back in supine      Pertinent Vitals/Pain Pain Assessment Pain Assessment: 0-10 Pain Score: 7  Pain Location: back Pain Descriptors / Indicators: Aching, Discomfort Pain Intervention(s): Limited activity within patient's tolerance, Monitored during session, Premedicated before session, Repositioned    Home Living                          Prior Function            PT Goals (current goals can now be found in the care plan section) Acute Rehab PT Goals Patient Stated Goal: Return to home/prior function PT Goal Formulation: With patient Time For Goal Achievement: 02/02/24 Progress towards PT goals: Progressing toward goals    Frequency    Min 2X/week      PT Plan      Co-evaluation PT/OT/SLP Co-Evaluation/Treatment: Yes Reason for Co-Treatment: Complexity of the patient's impairments (multi-system involvement);For patient/therapist safety;To address functional/ADL transfers PT goals addressed during session: Mobility/safety with mobility;Balance;Proper use of DME OT goals addressed during session: ADL's and self-care      AM-PAC PT 6 Clicks Mobility   Outcome Measure  Help needed turning from your back to your side while in a flat bed without using bedrails?: A Little Help needed moving from lying on your back to sitting on the side of a flat bed without using bedrails?: A Lot Help needed moving to and from a bed to a chair (including a wheelchair)?: A Lot Help needed standing up from a chair using your arms (e.g., wheelchair or bedside chair)?: Total Help needed to walk in  hospital room?: Total Help needed climbing 3-5 steps with a railing? : Total 6 Click Score: 10    End of Session Equipment Utilized During Treatment: Gait belt Activity Tolerance: Patient tolerated treatment well Patient left: in bed;with call bell/phone within reach;with bed alarm set;Other (comment) (HOB to ~25 deg (pt does not tolerate higher after sitting up to drink water, request to go back below 30 deg); heels floated) Nurse Communication: Mobility status PT Visit Diagnosis: Muscle weakness (generalized) (M62.81);Other abnormalities of gait and mobility (R26.89)     Time: 8688-8646 PT Time Calculation (min) (ACUTE ONLY): 42 min  Charges:    $Therapeutic Activity: 8-22 mins PT General Charges $$ ACUTE PT VISIT: 1 Visit                     Minnah Llamas P., PTA Acute Rehabilitation Services Secure Chat Preferred 9a-5:30pm Office: [phone]    Connell HERO [facility] 01/21/2024, 3:51 PM

## 2024-01-21 NOTE — Progress Notes (Signed)
 PROGRESS NOTE  Raymond [name]  DOB: Caudill 12, 1967  PCP: [name], MD FMW:[id number]  DOA: 01/18/2024  LOS: 2 days  Hospital Day: 4  Brief narrative: Raymond [name] is a 58 y.o. male with PMH significant for morbid obesity, OSA on CPAP, DM2, HTN, HLD, CKD, COPD, DVT on anticoagulation, diverticulosis, GI bleed, chronic back pain, ambulatory dysfunction. 8/18, presented to ED with complaint of 2 weeks of, nausea, vomiting and 3 days of dark cloudy cola colored urine with frequency and urgency. At baseline, has limited ambulation, mostly wheelchair-bound.  On Eliquis  for chronic DVT.  In the ED, hemodynamically stable Initial labs with WBC 14.6, hemoglobin 13.3, creatinine elevated to 3 against a baseline of 2. Urinalysis showed cloudy amber color urine with moderate hemoglobin, moderate leukocytes CT abdomen showed subtle hyperdensity within the posterior bladder suggestive of hemorrhage versus infection, no urinary tract calculi or hydronephrosis. ED provider communicated with Dr. Sherrilee with urology who recommended treatment with antibiotics for 48 hours to see if it will clear up the urine if not urology consultation would be appropriate.    Started on IV Rocephin  Urine culture sent on admission grew 40,000 CFU per mL of E. Coli. In the next 24 hours since admission, urine cleared up.  Subjective: Patient was seen and examined this morning. Propped up in bed.  Alert, awake, oriented x 3.  States she slept well last night.  Mental status much better today. Family not at bedside at time of my evaluation. Labs from this morning showed creatinine up at 2.82. Later this morning, at the request of family, I restarted his oxycodone .  Assessment and plan: Acute UTI with hematuria h/o BPH   Presented with abdominal pain, nausea, vomiting urinary frequency, urgency and hematuria  Urinalysis as above. Mailings without calculi, hydronephrosis or mass Urine culture grew only 40,000  CFU per mL of E. coli but given significant symptoms, treated as true UTI UTI symptoms and hematuria improving with IV Rocephin  WBC count normalized. Eliquis  is currently on hold.  Hemoglobin stable Continue doxazosin  for BPH Recent Labs  Lab 01/18/24 1423 01/19/24 0319 01/20/24 0932 01/21/24 0145  WBC 14.6* 13.7* 11.4* 10.0   AKI on CKD 3a Baseline creatinine less than 2.  Presented with creatinine elevated to 3.02.  Gradually improved but worsening again, 2.8 today.  Continue to hold Lasix .  Start normal saline for next 24 hours.  Repeat labs tomorrow. Recent Labs    05/20/23 2117 05/21/23 1628 05/22/23 0610 05/23/23 0304 05/24/23 0448 05/25/23 0427 01/18/24 1423 01/19/24 0319 01/20/24 0932 01/21/24 0145  BUN 27* 19 17 15 17 18  49* 46* 48* 47*  CREATININE 2.00* 1.75* 1.85* 1.59* 1.85* 1.87* 3.02* 2.68* 2.71* 2.82*   Hypokalemia/hypomagnesemia Potassium and magnesium  were replaced yesterday.   Repeat labs today with potassium low at 2.9.  Replacement ordered Recent Labs  Lab 01/18/24 1423 01/19/24 0319 01/20/24 0932 01/21/24 0145  K 3.8 3.0* 2.9* 3.2*  MG  --  1.6*  --   --    Acute metabolic encephalopathy Mental status much better this morning.  Yesterday, patient had increased somnolence likely because of polypharmacy.  Haldol  was stopped.  Baclofen  was changed to as needed. At the request of family, I have restarted oxycodone  as needed.  Hypertension Currently blood pressure controlled on carvedilol , amlodipine  doxazosin , hydralazine .  On hold her valsartan , HCTZ and Lasix  because of AKI  Type 2 diabetes mellitus A1c 6 on 03/12/2023 PTA meds-none  HLD Continue Lipitor   H/o COPD,  sleep apnea Respiratory status stable.   Continue with supportive care including bronchodilators, incentive spirometry.  CPAP at nighttime.  Morbid Obesity  Body mass index is 42.83 kg/m. Patient has been advised to make an attempt to improve diet and exercise patterns to  aid in weight loss. On Mounjaro  weekly  Chronic back pain  Impaired mobility Mostly wheelchair-bound status. Goes to outpatient PT Seen by PT.  CIR recommended Pain regimen --- Scheduled: Cymbalta  at lower dose --- PRN: Oxycodone , baclofen   RLE DVT - 03/25/2023  On Eliquis  for the last 10 months.  Held on admission due to hematuria. On chart review I noted that patient had ultrasound duplex lower extremities done multiple times in the past probably due to chronic leg swelling leading to suspicion of DVTs.  Prior imagings before 03/22/2023 were negative. Ultrasound duplex negative if negative.  I would continue to hold Eliquis .  Vit D deficiency ergocalciferol  50,000 IU weekly     Impaired mobility  Seen by PT.  CIR was recommended but family has made a decision to take him home at discharge.  Goals of care   Code Status: Full Code     DVT prophylaxis:  SCDs Start: 01/18/24 1946   Antimicrobials: IV Rocephin  Fluid: None Consultants: None currently Family Communication: None at bedside  Status: Inpatient Level of care:  Telemetry Medical   Patient is from: Home Needs to continue in-hospital care: Creatinine rising up.  Needs IV hydration Anticipated d/c to: Hopefully home in 1 to 2 days    Diet:  Diet Order             Diet Heart Room service appropriate? Yes; Fluid consistency: Thin  Diet effective now                   Scheduled Meds:  amLODipine   10 mg Oral Daily   atorvastatin   40 mg Oral QPM   carvedilol   25 mg Oral BID WC   doxazosin   2 mg Oral QHS   DULoxetine   30 mg Oral Daily   hydrALAZINE   25 mg Oral TID   lidocaine   2 patch Transdermal Q2200   pantoprazole   40 mg Oral BID AC   polyethylene glycol  17 g Oral Daily   pramipexole   0.5 mg Oral BID   sodium chloride  flush  3 mL Intravenous Q12H   Vitamin D  (Ergocalciferol )  50,000 Units Oral Q14 Days    PRN meds: acetaminophen  **OR** acetaminophen , albuterol , ALPRAZolam , baclofen ,  hydrALAZINE , ondansetron  **OR** ondansetron  (ZOFRAN ) IV, oxyCODONE -acetaminophen  **AND** oxyCODONE , polyethylene glycol, sodium chloride  flush   Infusions:   sodium chloride  75 mL/hr at 01/21/24 0838   cefTRIAXone  (ROCEPHIN )  IV 1 g (01/20/24 2158)    Antimicrobials: Anti-infectives (From admission, onward)    Start     Dose/Rate Route Frequency Ordered Stop   01/18/24 2000  cefTRIAXone  (ROCEPHIN ) 1 g in sodium chloride  0.9 % 100 mL IVPB        1 g 200 mL/hr over 30 Minutes Intravenous Every 24 hours 01/18/24 1958         Objective: Vitals:   01/21/24 0824 01/21/24 1206  BP: 118/73 118/65  Pulse: 82 70  Resp:    Temp: 98.7 F (37.1 C)   SpO2: 95% 95%    Intake/Output Summary (Last 24 hours) at 01/21/2024 1320 Last data filed at 01/21/2024 0049 Gross per 24 hour  Intake 180 ml  Output 550 ml  Net -370 ml   Filed Weights   01/19/24 0500  01/20/24 0500 01/21/24 0500  Weight: (!) 138.4 kg (!) 139.3 kg (!) 139.3 kg   Weight change: 0 kg Body mass index is 42.83 kg/m.   Physical Exam: General exam: Pleasant, middle-aged Caucasian male.  Looks older for his age.  Morbidly obese Skin: No rashes, lesions or ulcers. HEENT: Atraumatic, normocephalic, no obvious bleeding Lungs: Clear to auscultation bilaterally,  CVS: S1, S2, no murmur,   GI/Abd: Soft, nontender, nondistended, bowel sound present,   CNS: Alert, awake, oriented x 3  psychiatry: Mood appropriate Extremities: No pedal edema, no calf tenderness  Data Review: I have personally reviewed the laboratory data and studies available.  F/u labs  Unresulted Labs (From admission, onward)     Start     Ordered   01/22/24 0500  Basic metabolic panel with GFR  Tomorrow morning,   R        01/21/24 1317   01/22/24 0500  CBC with Differential/Platelet  Tomorrow morning,   R        01/21/24 1317   Unscheduled  Occult blood card to lab, stool  As needed,   R      01/18/24 2013            Signed, Chapman Rota,  MD [facility] 01/21/2024

## 2024-01-21 NOTE — Consult Note (Signed)
 Reason for Consult: Acute kidney injury on chronic kidney disease stage III Referring Physician: Chapman Rota MD [facility])  HPI:  58 year old man with past medical history significant for type 2 diabetes mellitus without any associated retinopathy, hypertension, obesity, dyslipidemia, COPD, history of diverticulosis, chronic back pain, prior history of DVT (not on anticoagulation), BPH and what appears to be chronic kidney disease stage III at baseline with a creatinine of 1.6-1.8 based on hospital records.  He was admitted to the hospital 3 days ago with concerns of painful hematuria that has improved/resolved with management for cystitis.  He reports a preceding 1-1/2-weeks of suboptimal oral intake and corresponding weight loss prior to presentation.  The concern is raised with his renal function that has been sluggish to improve from an admission creatinine of 3.0 down to 2.7 yesterday and again back up to 2.8 today.  His records reviewed show elevated blood pressures earlier in the admission that have improved, no exposure to iodinated intravenous contrast or NSAIDs and a 950 cc urine output overnight.  Yesterday he had some Haldol  and was somnolent the entire day until he was fully awake at about 7:30 PM when he started to eat and drink normally.  Oral intake is back to baseline today.  He denies any chest pain or shortness of breath and does not have any nausea, vomiting or diarrhea.  He does not voice any abdominal pain or pelvic discomfort today.  He knows that he has chronic kidney disease and is under close surveillance by his primary care doctor.   Past Medical History:  Diagnosis Date   Acute kidney injury superimposed on chronic kidney disease (HCC) 04/13/2023   Acute on chronic anemia 04/13/2023   Anemia    Anxiety    Back pain    COPD (chronic obstructive pulmonary disease) (HCC)    Depression    Diabetes mellitus without complication (HCC)    Diverticulosis    with hx of LGIB    Edema    GERD (gastroesophageal reflux disease)    GIB (gastrointestinal bleeding)    Gout    Heavy smoker    Hyperlipidemia    Hypertension    Iron deficiency anemia    Sleep apnea    uses a cpap   Swelling of lower extremity     Past Surgical History:  Procedure Laterality Date   CHONDROPLASTY Left 06/28/2014   Procedure: CHONDROPLASTY;  Surgeon: LELON JONETTA Shari Mickey., MD;  Location: [facility];  Service: Orthopedics;  Laterality: Left;   COLONOSCOPY     FOOT FASCIOTOMY     age 59-rt   KNEE ARTHROSCOPY WITH LATERAL MENISECTOMY Left 06/28/2014   Procedure: KNEE ARTHROSCOPY WITH LATERAL MENISECTOMY;  Surgeon: LELON JONETTA Shari Mickey., MD;  Location: [facility];  Service: Orthopedics;  Laterality: Left;   KNEE ARTHROSCOPY WITH MEDIAL MENISECTOMY Left 06/28/2014   Procedure: LEFT KNEE ARTHROSCOPY CHONDROPLASTY/WITH MEDIAL/LATERAL MENISECTOMIES;  Surgeon: LELON JONETTA Shari Mickey., MD;  Location: [facility];  Service: Orthopedics;  Laterality: Left;   ORIF RADIUS & ULNA FRACTURES  2007   left   THORACIC DISCECTOMY N/A 03/12/2023   Procedure: THORACIC LAMINECTOMY AND  DISCECTOMY;  Surgeon: Gillie Duncans, MD;  Location[facility] OR;  Service: Neurosurgery;  Laterality: N/A;    Family History  Problem Relation Age of Onset   Diabetes Mother    Hypertension Mother    High Cholesterol Mother    Heart disease Mother    Depression Mother  Diabetes Father    Hypertension Father    High Cholesterol Father    Heart disease Father    Cancer Other     Social History:  reports that he has quit smoking. His smoking use included cigarettes. He has never used smokeless tobacco. He reports current alcohol use. He reports that he does not use drugs.  Allergies:  Allergies  Allergen Reactions   Haldol  [Haloperidol ] Other (See Comments)    Makes drowsy   Morphine  Other (See Comments)    Mental status changes - angry, mean    Sulfa  Antibiotics Hives    Other Reaction(s):  Unknown    Medications: I have reviewed the patient's current medications. Scheduled:  amLODipine   10 mg Oral Daily   atorvastatin   40 mg Oral QPM   carvedilol   25 mg Oral BID WC   doxazosin   2 mg Oral QHS   DULoxetine   30 mg Oral Daily   hydrALAZINE   25 mg Oral TID   lidocaine   2 patch Transdermal Q2200   pantoprazole   40 mg Oral BID AC   polyethylene glycol  17 g Oral Daily   pramipexole   0.5 mg Oral BID   sodium chloride  flush  3 mL Intravenous Q12H   Vitamin D  (Ergocalciferol )  50,000 Units Oral Q14 Days   Continuous:  sodium chloride  75 mL/hr at 01/21/24 0838   cefTRIAXone  (ROCEPHIN )  IV 1 g (01/20/24 2158)       Latest Ref Rng & Units 01/21/2024    1:45 AM 01/20/2024    9:32 AM 01/19/2024    3:19 AM  BMP  Glucose 70 - 99 mg/dL 861  877  882   BUN 6 - 20 mg/dL 47  48  46   Creatinine 0.61 - 1.24 mg/dL 7.17  7.28  7.31   Sodium 135 - 145 mmol/L 139  139  137   Potassium 3.5 - 5.1 mmol/L 3.2  2.9  3.0   Chloride 98 - 111 mmol/L 93  90  93   CO2 22 - 32 mmol/L 31  31  32   Calcium  8.9 - 10.3 mg/dL 9.8  89.6  9.8       Latest Ref Rng & Units 01/21/2024    1:45 AM 01/20/2024    9:32 AM 01/19/2024    3:19 AM  CBC  WBC 4.0 - 10.5 K/uL 10.0  11.4  13.7   Hemoglobin 13.0 - 17.0 g/dL 87.7  86.9  87.8   Hematocrit 39.0 - 52.0 % 37.0  39.0  36.6   Platelets 150 - 400 K/uL 362  371  347    Urinalysis    Component Value Date/Time   COLORURINE AMBER (A) 01/18/2024 1724   APPEARANCEUR CLOUDY (A) 01/18/2024 1724   LABSPEC 1.009 01/18/2024 1724   PHURINE 6.0 01/18/2024 1724   GLUCOSEU NEGATIVE 01/18/2024 1724   HGBUR MODERATE (A) 01/18/2024 1724   BILIRUBINUR NEGATIVE 01/18/2024 1724   KETONESUR NEGATIVE 01/18/2024 1724   PROTEINUR 100 (A) 01/18/2024 1724   NITRITE NEGATIVE 01/18/2024 1724   LEUKOCYTESUR MODERATE (A) 01/18/2024 1724    VAS US  LOWER EXTREMITY VENOUS (DVT) Result Date: 01/20/2024  Lower Venous DVT Study Patient Name:  DYMIR NEESON Zebrowski  Date of Exam:    01/20/2024 Medical Rec #: [id number]    Accession #:    [id number] Date of Birth: 28-Jan-1966     Patient Gender: M Patient Age:   22 years Exam Location:  [facility] Procedure:  VAS US  LOWER EXTREMITY VENOUS (DVT) Referring Phys: CHAPMAN ROTA --------------------------------------------------------------------------------  Indications: Swelling, and Edema. Other Indications: History of DVT on 03/25/23. Risk Factors: Obesity and COPD. Anticoagulation: Eliquis . Comparison Study: 03/25/23 - DVT in right peroneal veins and left popliteal                   vein. Performing Technologist: Ricka Sturdivant-Jones RDMS, RVT  Examination Guidelines: A complete evaluation includes B-mode imaging, spectral Doppler, color Doppler, and power Doppler as needed of all accessible portions of each vessel. Bilateral testing is considered an integral part of a complete examination. Limited examinations for reoccurring indications [place] be performed as noted. The reflux portion of the exam is performed with the patient in reverse Trendelenburg.  +---------+---------------+---------+-----------+----------+--------------+ RIGHT    CompressibilityPhasicitySpontaneityPropertiesThrombus Aging +---------+---------------+---------+-----------+----------+--------------+ CFV      Full           Yes      Yes                                 +---------+---------------+---------+-----------+----------+--------------+ SFJ      Full                                                        +---------+---------------+---------+-----------+----------+--------------+ FV Prox  Full                                                        +---------+---------------+---------+-----------+----------+--------------+ FV Mid   Full           Yes      Yes                                 +---------+---------------+---------+-----------+----------+--------------+ FV DistalFull                                                         +---------+---------------+---------+-----------+----------+--------------+ PFV      Full                                                        +---------+---------------+---------+-----------+----------+--------------+ POP      Full           Yes      Yes                                 +---------+---------------+---------+-----------+----------+--------------+ PTV      Full                                                        +---------+---------------+---------+-----------+----------+--------------+  PERO     Full                                                        +---------+---------------+---------+-----------+----------+--------------+   +---------+---------------+---------+-----------+----------+--------------+ LEFT     CompressibilityPhasicitySpontaneityPropertiesThrombus Aging +---------+---------------+---------+-----------+----------+--------------+ CFV      Full           Yes      Yes                                 +---------+---------------+---------+-----------+----------+--------------+ SFJ      Full                                                        +---------+---------------+---------+-----------+----------+--------------+ FV Prox  Full                                                        +---------+---------------+---------+-----------+----------+--------------+ FV Mid   Full           Yes      Yes                                 +---------+---------------+---------+-----------+----------+--------------+ FV DistalFull                                                        +---------+---------------+---------+-----------+----------+--------------+ PFV      Full                                                        +---------+---------------+---------+-----------+----------+--------------+ POP      Full           Yes      Yes                                  +---------+---------------+---------+-----------+----------+--------------+ PTV      Full                                                        +---------+---------------+---------+-----------+----------+--------------+ PERO     Full                                                        +---------+---------------+---------+-----------+----------+--------------+  Summary: RIGHT: - Findings suggest resolution of previously noted thrombus. - There is no evidence of deep vein thrombosis in the lower extremity.  - No cystic structure found in the popliteal fossa.  LEFT: - Findings suggest resolution of previously noted thrombus. - There is no evidence of deep vein thrombosis in the lower extremity.  - No cystic structure found in the popliteal fossa.  *See table(s) above for measurements and observations. Electronically signed by Gaile New MD on 01/20/2024 at 10:45:52 PM.    Final     Review of Systems  Constitutional:  Positive for appetite change. Negative for chills and fatigue.  HENT:  Negative for nosebleeds, sore throat and trouble swallowing.   Respiratory:  Negative for cough, chest tightness and shortness of breath.   Cardiovascular:  Negative for chest pain and leg swelling.  Gastrointestinal:  Negative for blood in stool, diarrhea, nausea and vomiting.  Endocrine: Negative for polydipsia and polyuria.  Genitourinary:  Positive for difficulty urinating and hematuria. Negative for flank pain and frequency.  Musculoskeletal:  Positive for gait problem. Negative for joint swelling and myalgias.       Chronic gait problems since back surgery last year  Skin:  Negative for rash and wound.  Neurological:  Negative for weakness and headaches.   Blood pressure 118/65, pulse 70, temperature 98.7 F (37.1 C), resp. rate 20, height 5' 11 (1.803 m), weight (!) 139.3 kg, SpO2 95%. Physical Exam Vitals and nursing note reviewed.  Constitutional:      Appearance: He is  well-developed. He is obese. He is not ill-appearing.  HENT:     Head: Normocephalic and atraumatic.  Eyes:     General: No scleral icterus.    Extraocular Movements: Extraocular movements intact.  Cardiovascular:     Rate and Rhythm: Normal rate and regular rhythm.     Heart sounds: Normal heart sounds.  Pulmonary:     Breath sounds: Normal breath sounds. No wheezing or rales.  Abdominal:     General: Abdomen is protuberant. Bowel sounds are normal. There is no distension.     Palpations: Abdomen is soft.     Tenderness: There is no abdominal tenderness.  Musculoskeletal:     Right lower leg: No edema.     Left lower leg: No edema.  Skin:    General: Skin is warm and dry.     Coloration: Skin is not pale.  Neurological:     Mental Status: He is alert and oriented to person, place, and time.     Assessment/Plan: 1.  Acute kidney injury on chronic kidney disease stage III: Appears hemodynamically mediated with poor oral intake/suboptimal fluid intake prior to admission accounting for his elevated creatinine in the setting of cystitis.  He appears to have had some improvement of renal function with a stuttering course likely because of his altered mental status and decreased oral intake yesterday.  I agree with intravenous fluid supplementation for the next 24 hours and continue surveillance of labs.  No indication to repeat urinary imaging and will defer urine studies at this time anticipating improvement with current supportive management. 2.  Hypokalemia: Secondary to poor oral intake overnight/shift with alkalosis, agree with oral supplementation. 3.  Metabolic alkalosis: I suspect that this chronic in this obese man with history of COPD.  Continue to monitor with intravenous fluids with likely superimposed contraction alkalosis. 4.  Acute cystitis: Clinically improving with ongoing ceftriaxone /IV fluids. 5.  Hypertension: Blood pressures well-controlled on the current regimen,  monitor for relative hypotension and need to de-escalate antihypertensive therapy overnight.  Gordy MARLA Blanch 01/21/2024, 4:36 PM

## 2024-01-21 NOTE — Progress Notes (Signed)
 Occupational Therapy Treatment Patient Details Name: Raymond [name] MRN: [id number] DOB: 1966-01-12 Today's Date: 01/21/2024   History of present illness 58 years old male presented to the hospital with complaints of 2 weeks of nausea vomiting and subsequently had a week of intermittent abdominal pain, with cloudy dark urine.  with past medical history of chronic kidney disease, COPD, depression, diabetes, history of diverticulosis, GI bleed, hyperlipidemia, hypertension, sleep apnea and chronic back pain, history of DVT in the past with ambulatory dysfunction   OT comments  Patient received in supine and eager to participate in OT/PT session. Patient demonstrating gains with bed mobility with min assist to get to EOB. Patient was provided with education on slide board transfers and setup for transfers to wheelchair. Patient required frequent cues for body mechanics and hand placement during transfer. Patient able to perform wheelchair mobility in hallway. Patient returned to EOB with slide board transfer and increased assistance due to going from low to higher surface but demonstrated good understanding. Patient performed gown change before returning to supine. Patient will benefit from intensive inpatient follow-up therapy, >3 hours/day.  Acute OT to continue to follow to address established goals to facilitate DC to next venue of care.        If plan is discharge home, recommend the following:  Two people to help with walking and/or transfers;A lot of help with bathing/dressing/bathroom;Assistance with cooking/housework;Assist for transportation;Help with stairs or ramp for entrance   Equipment Recommendations  None recommended by OT    Recommendations for Other Services      Precautions / Restrictions Precautions Precautions: Fall Recall of Precautions/Restrictions: Intact Restrictions Weight Bearing Restrictions Per Provider Order: No       Mobility Bed Mobility Overal bed mobility:  Needs Assistance Bed Mobility: Supine to Sit, Sit to Supine     Supine to sit: HOB elevated, Used rails, Min assist Sit to supine: Mod assist   General bed mobility comments: assistance with scooting towards HOB and with trunk to return to supine    Transfers Overall transfer level: Needs assistance Equipment used: Sliding board Transfers: Bed to chair/wheelchair/BSC            Lateral/Scoot Transfers: Min assist, Mod assist, +2 physical assistance General transfer comment: min to CGA +2 for transfer from EOB to wheelchair with sliding board from high to low surface. min to mod assist +2 for wheelchair to EOB, low to high surface. Assistance with board setup     Balance Overall balance assessment: Needs assistance Sitting-balance support: No upper extremity supported, Feet supported Sitting balance-Leahy Scale: Fair Sitting balance - Comments: EOB                                   ADL either performed or assessed with clinical judgement   ADL Overall ADL's : Needs assistance/impaired     Grooming: Set up;Sitting           Upper Body Dressing : Minimal assistance;Sitting Upper Body Dressing Details (indicate cue type and reason): changed gown Lower Body Dressing: Maximal assistance;Sitting/lateral leans;Bed level                 General ADL Comments: focused on bed mobility and transfers    Extremity/Trunk Assessment              Vision       Perception     Praxis     Communication  Cognition Arousal: Alert Behavior During Therapy: WFL for tasks assessed/performed Cognition: No apparent impairments             OT - Cognition Comments: patient in pleasant mood and eager to participate with therapy                          Cueing      Exercises Exercises: Other exercises Other Exercises Other Exercises: wheelchair mobility in hallway using BUEs    Shoulder Instructions       General Comments       Pertinent Vitals/ Pain       Pain Assessment Pain Assessment: Faces Faces Pain Scale: Hurts little more Pain Location: back Pain Descriptors / Indicators: Aching, Discomfort Pain Intervention(s): Limited activity within patient's tolerance, Premedicated before session, Monitored during session, Repositioned  Home Living                                          Prior Functioning/Environment              Frequency  Min 2X/week        Progress Toward Goals  OT Goals(current goals can now be found in the care plan section)  Progress towards OT goals: Progressing toward goals  Acute Rehab OT Goals Patient Stated Goal: get better OT Goal Formulation: With patient Time For Goal Achievement: 02/02/24 Potential to Achieve Goals: Good ADL Goals Pt Will Perform Upper Body Dressing: with set-up Pt Will Perform Lower Body Dressing: with mod assist;sitting/lateral leans Pt Will Transfer to Toilet: with mod assist;with transfer board;bedside commode Pt Will Perform Toileting - Clothing Manipulation and hygiene: with mod assist;with adaptive equipment;sitting/lateral leans Additional ADL Goal #1: Pt will be able to complete lateral scoots on EOB with min A to return to PLOF and maximize ability to complete lateral scoot t/f with sliding board to w/c  Plan      Co-evaluation    PT/OT/SLP Co-Evaluation/Treatment: Yes Reason for Co-Treatment: Complexity of the patient's impairments (multi-system involvement);For patient/therapist safety;To address functional/ADL transfers   OT goals addressed during session: ADL's and self-care      AM-PAC OT 6 Clicks Daily Activity     Outcome Measure   Help from another person eating meals?: None Help from another person taking care of personal grooming?: A Little Help from another person toileting, which includes using toliet, bedpan, or urinal?: A Lot Help from another person bathing (including washing, rinsing,  drying)?: A Lot Help from another person to put on and taking off regular upper body clothing?: A Lot Help from another person to put on and taking off regular lower body clothing?: A Lot 6 Click Score: 15    End of Session Equipment Utilized During Treatment: Gait belt;Other (comment) (sliding board)  OT Visit Diagnosis: Unsteadiness on feet (R26.81);Other abnormalities of gait and mobility (R26.89);Muscle weakness (generalized) (M62.81);Pain Pain - part of body:  (back)   Activity Tolerance Patient tolerated treatment well   Patient Left in bed;with call bell/phone within reach;with bed alarm set   Nurse Communication Mobility status        Time: 1310-1351 OT Time Calculation (min): 41 min  Charges: OT General Charges $OT Visit: 1 Visit OT Treatments $Self Care/Home Management : 8-22 mins $Therapeutic Activity: 8-22 mins  Dick Laine, OTA Acute Rehabilitation Services  Office [phone]   Arul Farabee L  Dusty 01/21/2024, 3:14 PM

## 2024-01-21 NOTE — Plan of Care (Signed)

## 2024-01-22 DIAGNOSIS — R310 Gross hematuria: Secondary | ICD-10-CM | POA: Diagnosis not present

## 2024-01-22 LAB — CBC WITH DIFFERENTIAL/PLATELET
Abs Immature Granulocytes: 0.19 K/uL — ABNORMAL HIGH (ref 0.00–0.07)
Basophils Absolute: 0.1 K/uL (ref 0.0–0.1)
Basophils Relative: 1 %
Eosinophils Absolute: 0.7 K/uL — ABNORMAL HIGH (ref 0.0–0.5)
Eosinophils Relative: 7 %
HCT: 32.6 % — ABNORMAL LOW (ref 39.0–52.0)
Hemoglobin: 10.8 g/dL — ABNORMAL LOW (ref 13.0–17.0)
Immature Granulocytes: 2 %
Lymphocytes Relative: 28 %
Lymphs Abs: 2.7 K/uL (ref 0.7–4.0)
MCH: 30.8 pg (ref 26.0–34.0)
MCHC: 33.1 g/dL (ref 30.0–36.0)
MCV: 92.9 fL (ref 80.0–100.0)
Monocytes Absolute: 1 K/uL (ref 0.1–1.0)
Monocytes Relative: 10 %
Neutro Abs: 5.2 K/uL (ref 1.7–7.7)
Neutrophils Relative %: 52 %
Platelets: 336 K/uL (ref 150–400)
RBC: 3.51 MIL/uL — ABNORMAL LOW (ref 4.22–5.81)
RDW: 15.3 % (ref 11.5–15.5)
WBC: 9.9 K/uL (ref 4.0–10.5)
nRBC: 0 % (ref 0.0–0.2)

## 2024-01-22 LAB — RENAL FUNCTION PANEL
Albumin: 2.6 g/dL — ABNORMAL LOW (ref 3.5–5.0)
Anion gap: 13 (ref 5–15)
BUN: 48 mg/dL — ABNORMAL HIGH (ref 6–20)
CO2: 30 mmol/L (ref 22–32)
Calcium: 9.3 mg/dL (ref 8.9–10.3)
Chloride: 96 mmol/L — ABNORMAL LOW (ref 98–111)
Creatinine, Ser: 2.31 mg/dL — ABNORMAL HIGH (ref 0.61–1.24)
GFR, Estimated: 32 mL/min — ABNORMAL LOW (ref >60)
Glucose, Bld: 158 mg/dL — ABNORMAL HIGH (ref 70–99)
Phosphorus: 2.7 mg/dL (ref 2.5–4.6)
Potassium: 3.1 mmol/L — ABNORMAL LOW (ref 3.5–5.1)
Sodium: 139 mmol/L (ref 135–145)

## 2024-01-22 MED ORDER — CEFADROXIL 500 MG PO CAPS: 1000.0000 mg | ORAL_CAPSULE | Freq: Two times a day (BID) | ORAL | 0 refills | Status: AC

## 2024-01-22 MED ORDER — CEFADROXIL 500 MG PO CAPS
1000.0000 mg | ORAL_CAPSULE | Freq: Two times a day (BID) | ORAL | Status: DC
  Administered 2024-01-22: 1000 mg via ORAL
  Filled 2024-01-22: qty 2

## 2024-01-22 MED ORDER — POTASSIUM CHLORIDE CRYS ER 20 MEQ PO TBCR
40.0000 meq | EXTENDED_RELEASE_TABLET | Freq: Once | ORAL | Status: AC
  Administered 2024-01-22: 40 meq via ORAL
  Filled 2024-01-22: qty 2

## 2024-01-22 MED ORDER — FUROSEMIDE 40 MG PO TABS: 160.0000 mg | ORAL_TABLET | Freq: Every day | ORAL | Status: DC | PRN

## 2024-01-22 NOTE — Progress Notes (Signed)
 Nephrology Follow-Up Consult note   Assessment/Recommendations: Raymond [name] is a/an 58 y.o. male with a past medical history significant for DM2, HTN, obesity, HLD, COPD, BPH, admitted for hematuria c/b AKI.      1.  Acute kidney injury on chronic kidney disease stage III: BL crt 1.5-1.8. Appears hemodynamically mediated with poor oral intake/suboptimal fluid intake prior to admission accounting for his elevated creatinine in the setting of cystitis.  Crt improved significantly today. He is okay for DC and we will sign off. 2.  Hypokalemia: cont w/ oral replacement 3.  Metabolic alkalosis: I suspect that this chronic in this obese man with history of COPD.   4.  Acute cystitis: Clinically improving with ongoing ceftriaxone /IV fluids. 5.  Hypertension: Blood pressures well-controlled on the current regimen, monitor for relative hypotension and need to de-escalate antihypertensives  Given improvement we will sign off. Recommend DC and labs in 1 week   Recommendations conveyed to primary service.    [facility] 01/22/2024 8:48 AM  ___________________________________________________________  CC: hematuria  Interval History/Subjective: Patient feels well today with no complaints. Denies SOB or CP. Good UOP per patient. Crt improved to 2.3 today.   Medications:  Current Facility-Administered Medications  Medication Dose Route Frequency Provider Last Rate Last Admin   acetaminophen  (TYLENOL ) tablet 650 mg  650 mg Oral Q6H PRN Pokhrel, Laxman, MD   650 mg at 01/21/24 1727   Or   acetaminophen  (TYLENOL ) suppository 650 mg  650 mg Rectal Q6H PRN Pokhrel, Laxman, MD       albuterol  (PROVENTIL ) (2.5 MG/3ML) 0.083% nebulizer solution 3 mL  3 mL Inhalation Q6H PRN Pokhrel, Laxman, MD       ALPRAZolam  (XANAX ) tablet 0.5 mg  0.5 mg Oral BID PRN Pokhrel, Laxman, MD       amLODipine  (NORVASC ) tablet 10 mg  10 mg Oral Daily Pokhrel, Laxman, MD   10 mg at 01/22/24  9166   atorvastatin  (LIPITOR) tablet 40 mg  40 mg Oral QPM Pokhrel, Laxman, MD   40 mg at 01/21/24 1722   baclofen  (LIORESAL ) tablet 15 mg  15 mg Oral Q6H PRN Dahal, Chapman, MD   15 mg at 01/21/24 1727   carvedilol  (COREG ) tablet 25 mg  25 mg Oral BID WC Pokhrel, Laxman, MD   25 mg at 01/22/24 9166   cefTRIAXone  (ROCEPHIN ) 1 g in sodium chloride  0.9 % 100 mL IVPB  1 g Intravenous Q24H Pokhrel, Laxman, MD 200 mL/hr at 01/21/24 2102 1 g at 01/21/24 2102   doxazosin  (CARDURA ) tablet 2 mg  2 mg Oral QHS Pokhrel, Laxman, MD   2 mg at 01/21/24 2058   DULoxetine  (CYMBALTA ) DR capsule 30 mg  30 mg Oral Daily Pokhrel, Laxman, MD   30 mg at 01/22/24 9166   hydrALAZINE  (APRESOLINE ) injection 5 mg  5 mg Intravenous Q6H PRN Pokhrel, Laxman, MD       hydrALAZINE  (APRESOLINE ) tablet 25 mg  25 mg Oral TID Pokhrel, Laxman, MD   25 mg at 01/22/24 9167   lidocaine  (LIDODERM ) 5 % 2 patch  2 patch Transdermal Q2200 Pokhrel, Laxman, MD   2 patch at 01/21/24 2058   ondansetron  (ZOFRAN ) tablet 4 mg  4 mg Oral Q6H PRN Pokhrel, Laxman, MD       Or   ondansetron  (ZOFRAN ) injection 4 mg  4 mg Intravenous Q6H PRN Pokhrel, Laxman, MD   4 mg at 01/18/24 2147   oxyCODONE -acetaminophen  (PERCOCET/ROXICET) 5-325 MG per tablet 1 tablet  1 tablet Oral Q6H PRN Reome, Earle J, RPH   1 tablet at 01/22/24 9166   And   oxyCODONE  (Oxy IR/ROXICODONE ) immediate release tablet 5 mg  5 mg Oral Q6H PRN Reome, Earle J, RPH   5 mg at 01/21/24 2247   pantoprazole  (PROTONIX ) EC tablet 40 mg  40 mg Oral BID AC Pokhrel, Laxman, MD   40 mg at 01/22/24 9166   polyethylene glycol (MIRALAX  / GLYCOLAX ) packet 17 g  17 g Oral Daily PRN Pokhrel, Laxman, MD       polyethylene glycol (MIRALAX  / GLYCOLAX ) packet 17 g  17 g Oral Daily Pokhrel, Laxman, MD   17 g at 01/22/24 9167   potassium chloride  SA (KLOR-CON  M) CR tablet 40 mEq  40 mEq Oral Once Dahal, Binaya, MD       pramipexole  (MIRAPEX ) tablet 0.5 mg  0.5 mg Oral BID Pokhrel, Laxman, MD   0.5 mg at  01/22/24 9167   sodium chloride  flush (NS) 0.9 % injection 3 mL  3 mL Intravenous Q12H Pokhrel, Laxman, MD   3 mL at 01/21/24 2103   sodium chloride  flush (NS) 0.9 % injection 3 mL  3 mL Intravenous PRN Pokhrel, Laxman, MD       Vitamin D  (Ergocalciferol ) (DRISDOL ) 1.25 MG (50000 UNIT) capsule 50,000 Units  50,000 Units Oral Q14 Days Pokhrel, Laxman, MD   50,000 Units at 01/20/24 1226      Review of Systems: 10 systems reviewed and negative except per interval history/subjective  Physical Exam: Vitals:   01/22/24 0346 01/22/24 0751  BP: (!) 162/92 (!) 152/87  Pulse: (!) 57 (!) 55  Resp: 16 19  Temp: 97.8 F (36.6 C) 98 F (36.7 C)  SpO2: 96% 91%   Total I/O In: -  Out: 700 [Urine:700]  Intake/Output Summary (Last 24 hours) at 01/22/2024 0848 Last data filed at 01/22/2024 0753 Gross per 24 hour  Intake --  Output 2200 ml  Net -2200 ml   Constitutional: well-appearing, no acute distress ENMT: ears and nose without scars or lesions, MMM CV: normal rate, trace edema Respiratory: bilateral chest rise, normal work of breathing Gastrointestinal: soft, non-tender, no palpable masses or hernias Skin: no visible lesions or rashes Psych: alert, judgement/insight appropriate, appropriate mood and affect   Test Results I personally reviewed new and old clinical labs and radiology tests Lab Results  Component Value Date   NA 139 01/22/2024   K 3.1 (L) 01/22/2024   CL 96 (L) 01/22/2024   CO2 30 01/22/2024   BUN 48 (H) 01/22/2024   CREATININE 2.31 (H) 01/22/2024   GFR 92.19 10/05/2014   CALCIUM  9.3 01/22/2024   ALBUMIN  2.6 (L) 01/22/2024   PHOS 2.7 01/22/2024    CBC Recent Labs  Lab 01/20/24 0932 01/21/24 0145 01/22/24 0141  WBC 11.4* 10.0 9.9  NEUTROABS  --  5.9 5.2  HGB 13.0 12.2* 10.8*  HCT 39.0 37.0* 32.6*  MCV 91.8 92.5 92.9  PLT [phone]

## 2024-01-22 NOTE — TOC Transition Note (Signed)
 Transition of Care [facility]) - Discharge Note   Patient Details  Name: Raymond [name] MRN: [id number] Date of Birth: Mar 24, 1966  Transition of [facility] CM/SW Contact:  Rosaline JONELLE Joe, RN Phone Number: 01/22/2024, 10:23 AM   Clinical Narrative:    CM met with the patient at the bedside prior to discharge and patient prefers to continue Outpatient therapy at the [facility] location where he has been receiving water therapy.  Re-newed orders for Outpatient therapy have been placed and MD requests to co-sign.  Patient has all DME at the home including CPAP machine with RA, Wheelchair and slide boards at the home.  Wife has been assisting with supervision for slide board transfers prior to admission.  Patient plans to discharge home by car with the wife this morning.  No other IP Care management needs at this time.         Patient Goals and CMS Choice            Discharge Placement                       Discharge Plan and Services Additional resources added to the After Visit Summary for                                       Social Drivers of Health (SDOH) Interventions SDOH Screenings   Food Insecurity: No Food Insecurity (01/18/2024)  Housing: Low Risk  (01/18/2024)  Transportation Needs: No Transportation Needs (01/18/2024)  Utilities: Not At Risk (01/18/2024)  Depression (PHQ2-9): Low Risk  (01/11/2024)  Social Connections: Unknown (01/10/2022)   Received from [facility]  Stress: No Stress Concern Present (03/21/2022)   Received from [facility]  Tobacco Use: Medium Risk (01/18/2024)     Readmission Risk Interventions    01/22/2024   10:22 AM 12/25/2022    4:24 PM  Readmission Risk Prevention Plan  Post Dischage Appt  Complete  Medication Screening  Complete  Transportation Screening Complete Complete  PCP or Specialist Appt within 3-5 Days Complete   HRI or Home Care Consult Complete   Social Work Consult for Recovery Care  Planning/Counseling Complete   Palliative Care Screening Complete   Medication Review Oceanographer) Complete

## 2024-01-22 NOTE — Discharge Summary (Signed)
 Physician Discharge Summary  [name]:[id number] DOB: May 01, 1966 DOA: 01/18/2024  PCP: [name], MD  Admit date: 01/18/2024 Discharge date: 01/22/2024  Admitted from: Home Discharge disposition: Home with [facility]  Recommendations at discharge:  Completed a course of antibiotics with oral cefadroxil  for next 3 days Recommend to continue carvedilol , amlodipine , doxazosin , hydralazine .  Continue to hold valsartan , HCTZ.  Can resume Lasix  as needed daily for pedal edema, shortness of breath Recommend to minimize use of mood altering medications. DVT in his lower extremities seems to have resolved.  Given the risk of recurrence of hematuria, I would suggest to stop Eliquis . Follow-up with nephrologist as an outpatient.   Brief narrative: Raymond [name] is a 58 y.o. male with PMH significant for morbid obesity, OSA on CPAP, DM2, HTN, HLD, CKD, COPD, DVT on anticoagulation, diverticulosis, GI bleed, chronic back pain, ambulatory dysfunction. 8/18, presented to ED with complaint of 2 weeks of, nausea, vomiting and 3 days of dark cloudy cola colored urine with frequency and urgency. At baseline, has limited ambulation, mostly wheelchair-bound.  On Eliquis  for chronic DVT.  In the ED, hemodynamically stable Initial labs with WBC 14.6, hemoglobin 13.3, creatinine elevated to 3 against a baseline of 2. Urinalysis showed cloudy amber color urine with moderate hemoglobin, moderate leukocytes CT abdomen showed subtle hyperdensity within the posterior bladder suggestive of hemorrhage versus infection, no urinary tract calculi or hydronephrosis. ED provider communicated with Dr. Sherrilee with urology who recommended treatment with antibiotics for 48 hours to see if it will clear up the urine if not urology consultation would be appropriate.    Started on IV Rocephin  Urine culture sent on admission grew 40,000 CFU per mL of E. Coli. In the next 24 hours since admission, urine cleared  up.  Subjective: Patient was seen and examined this morning. Propped up in bed.   Alert, awake, oriented x 3.  Family not at bedside. Patient is very polite and apologized to me for his family's behavior in the last 2 days. Able to have a meaningful conversation.  Seen by nephrology earlier today.  Cleared for discharge from nephrology standpoint  Hospital course: Acute E. coli UTI  Presented with abdominal pain, nausea, vomiting urinary frequency, urgency and hematuria  Urinalysis as above. Imagings without calculi, hydronephrosis or mass Urine culture grew only 40,000 CFU per mL of E. coli but given significant symptoms, treated as true UTI UTI symptoms and hematuria improving with IV Rocephin .  WBC count normalized. Switch to oral cefadroxil  to complete total of 7-day course  Hematuria H/o BPH Hematuria has improved off Eliquis  Did not need urology consultation in the hospital. Continue doxazosin  for BPH Recent Labs  Lab 01/18/24 1423 01/19/24 0319 01/20/24 0932 01/21/24 0145 01/22/24 0141  WBC 14.6* 13.7* 11.4* 10.0 9.9   AKI on CKD 3a Baseline creatinine less than 2.  Presented with creatinine elevated to 3.02.  Gradually improved but worsening again and hence given IV fluid in the last 24 hours.  Creatinine much better 2.3 today.  Nephrology consultation was obtained.  Cleared by nephrology for discharge today.  To follow-up as an outpatient. Recent Labs    05/21/23 1628 05/22/23 0610 05/23/23 0304 05/24/23 0448 05/25/23 0427 01/18/24 1423 01/19/24 0319 01/20/24 0932 01/21/24 0145 01/22/24 0141  BUN 19 17 15 17 18  49* 46* 48* 47* 48*  CREATININE 1.75* 1.85* 1.59* 1.85* 1.87* 3.02* 2.68* 2.71* 2.82* 2.31*   Hypokalemia/hypomagnesemia Potassium level low at 3.1 today.  Replacement ordered. Recent Labs  Lab 01/18/24 1423 01/19/24 0319 01/20/24 0932 01/21/24 0145 01/22/24 0141  K 3.8 3.0* 2.9* 3.2* 3.1*  MG  --  1.6*  --   --   --   PHOS  --   --   --    --  2.7   Acute metabolic encephalopathy Mental status much better this morning.  Yesterday, patient had increased somnolence likely because of polypharmacy.  Haldol  was stopped.  Baclofen  was changed to as needed. Mental status much better in the last 24 hours. I have suggested the family to minimize use of mood altering medications.  Hypertension Currently blood pressure controlled on carvedilol , amlodipine , doxazosin , hydralazine .  On hold her valsartan , HCTZ and Lasix  because of AKI Last echo from 12/2022 with EF 55 to 60%, no WMA, RV size and function normal. At discharge, would recommend to continue carvedilol , amlodipine , doxazosin , hydralazine .  Continue to hold valsartan , HCTZ.  Can resume Lasix  as needed daily for pedal edema, shortness of breath  Type 2 diabetes mellitus A1c 6 on 03/12/2023 PTA meds-none  HLD Continue Lipitor   H/o COPD, sleep apnea Respiratory status stable.   Continue with supportive care including bronchodilators, incentive spirometry.  CPAP at nighttime.  Morbid Obesity  Body mass index is 42.83 kg/m. Patient has been advised to make an attempt to improve diet and exercise patterns to aid in weight loss. On Mounjaro  weekly  Chronic back pain  Impaired mobility Mostly wheelchair-bound status. Goes to outpatient PT Seen by PT.  CIR recommended but family preferred discharge home with home with services. At home, patient was on Cymbalta , oxycodone , baclofen  I have suggested the family to minimize use of mood altering medications.  RLE DVT - 03/25/2023  On Eliquis  for the last 10 months.  Held on admission due to hematuria. On chart review I noted that patient had ultrasound duplex lower extremities done multiple times in the past probably due to chronic leg swelling leading to suspicion of DVTs.  Prior imagings before 03/22/2023 were negative. Ultrasound duplex negative if negative.  I would suggest to stop Eliquis .  Patient's wife stated that he  would like to discuss this with patient's primary care provider.  Vit D deficiency Continue ergocalciferol  50,000 IU weekly     Goals of care   Code Status: Full Code   Diet:  Diet Order             Diet - low sodium heart healthy           Diet Heart Room service appropriate? Yes; Fluid consistency: Thin  Diet effective now                   Nutritional status:  Body mass index is 42.83 kg/m.       Wounds:  - Wound / Incision (Open or Dehisced) 03/12/23 Irritant Dermatitis (Moisture Associated Skin Damage) Abdomen Lower;Right;Left MASD abdominal folds (Active)  Date First Assessed/Time First Assessed: 03/12/23 1608   Wound Type: Irritant Dermatitis (Moisture Associated Skin Damage)  Location: Abdomen  Location Orientation: Lower;Right;Left  Wound Description (Comments): MASD abdominal folds  Present on Admis...    Assessments 03/12/2023  4:08 PM 04/10/2023  8:00 AM  Dressing Type None None  Site / Wound Assessment Painful;Pink Pink  % Wound base Red or Granulating 100% --  Peri-wound Assessment Bleeding;Pink Intact  Margins Attached edges (approximated) --  Drainage Amount Scant --  Drainage Description Sanguineous --  Treatment Cleansed Cleansed     No associated orders.  Wound 01/18/24 2225 Pressure Injury Heel Right Stage 2 -  Partial thickness loss of dermis presenting as a shallow open injury with a red, pink wound bed without slough. (Active)  Date First Assessed/Time First Assessed: 01/18/24 2225   Present on Original Admission: Yes  Primary Wound Type: Pressure Injury  Location: Heel  Location Orientation: Right  Staging: Stage 2 -  Partial thickness loss of dermis presenting as a shallow...    Assessments 01/18/2024 10:14 PM 01/21/2024  7:35 AM  Site / Wound Assessment Clean;Dry Clean;Dry  Peri-wound Assessment Intact Intact  Wound Length (cm) 1 cm --  Wound Width (cm) 1 cm --  Wound Surface Area (cm^2) 0.79 cm^2 --  Wound Depth (cm) 0 cm --  Wound  Volume (cm^3) 0 cm^3 --  Treatment Cleansed --  Dressing Type Foam - Lift dressing to assess site every shift --  Dressing Changed New --  Dressing Status Clean, Dry, Intact --  Margins -- Attached edges (approximated)     No associated orders.    Discharge Exam:   Vitals:   01/21/24 2247 01/21/24 2345 01/22/24 0346 01/22/24 0751  BP: (!) (P) 145/74 133/71 (!) 162/92 (!) 152/87  Pulse: (P) 61 63 (!) 57 (!) 55  Resp:  16 16 19   Temp:  98.1 F (36.7 C) 97.8 F (36.6 C) 98 F (36.7 C)  TempSrc:  Oral Oral   SpO2: (P) 94% 95% 96% 91%  Weight:      Height:        Body mass index is 42.83 kg/m.  General exam: Pleasant, middle-aged Caucasian male.  Looks older for his age.  Morbidly obese Skin: No rashes, lesions or ulcers. HEENT: Atraumatic, normocephalic, no obvious bleeding Lungs: Clear to auscultation bilaterally,  CVS: S1, S2, no murmur,   GI/Abd: Soft, nontender, nondistended, bowel sound present,   CNS: Alert, awake, oriented x 3  psychiatry: Mood appropriate Extremities: No pedal edema, no calf tenderness  Follow ups:    Follow-up Information     [name], MD Follow up.   Specialty: Internal Medicine Contact information: [address] [place] [address] [phone]                 Discharge Instructions:   Discharge Instructions     Ambulatory referral to Nephrology   Complete by: As directed    [facility]   Call MD for:  difficulty breathing, headache or visual disturbances   Complete by: As directed    Call MD for:  extreme fatigue   Complete by: As directed    Call MD for:  hives   Complete by: As directed    Call MD for:  persistant dizziness or light-headedness   Complete by: As directed    Call MD for:  persistant nausea and vomiting   Complete by: As directed    Call MD for:  severe uncontrolled pain   Complete by: As directed    Call MD for:  temperature >100.4   Complete by: As directed    Diet -  low sodium heart healthy   Complete by: As directed    Discharge instructions   Complete by: As directed    Recommendations at discharge:   Completed a course of antibiotics with oral cefadroxil  for next 3 days  Recommend to continue carvedilol , amlodipine , doxazosin , hydralazine .  Continue to hold valsartan , HCTZ.  Can resume Lasix  as needed daily for pedal edema, shortness of breath  Recommend to minimize use of  mood altering medications.  DVT in his lower extremities seems to have resolved.  Given the risk of recurrence of hematuria, I would suggest to stop Eliquis .  Follow-up with nephrologist as an outpatient.  General discharge instructions: Follow with Primary MD [name], MD in 7 days  Please request your PCP  to go over your hospital tests, procedures, radiology results at the follow up. Please get your medicines reviewed and adjusted.  Your PCP Erny decide to repeat certain labs or tests as needed. Do not drive, operate heavy machinery, perform activities at heights, swimming or participation in water activities or provide baby sitting services if your were admitted for syncope or siezures until you have seen by Primary MD or a Neurologist and advised to do so again. [place]  Controlled Substance Reporting System database was reviewed. Do not drive, operate heavy machinery, perform activities at heights, swim, participate in water activities or provide baby-sitting services while on medications for pain, sleep and mood until your outpatient physician has reevaluated you and advised to do so again.  You are strongly recommended to comply with the dose, frequency and duration of prescribed medications. Activity: As tolerated with Full fall precautions use walker/cane & assistance as needed Avoid using any recreational substances like cigarette, tobacco, alcohol, or non-prescribed drug. If you experience worsening of your admission symptoms, develop shortness of  breath, life threatening emergency, suicidal or homicidal thoughts you must seek medical attention immediately by calling 911 or calling your MD immediately  if symptoms less severe. You must read complete instructions/literature along with all the possible adverse reactions/side effects for all the medicines you take and that have been prescribed to you. Take any new medicine only after you have completely understood and accepted all the possible adverse reactions/side effects.  Wear Seat belts while driving. You were cared for by a hospitalist during your hospital stay. If you have any questions about your discharge medications or the care you received while you were in the hospital after you are discharged, you can call the unit and ask to speak with the hospitalist or the covering physician. Once you are discharged, your primary care physician will handle any further medical issues. Please note that NO REFILLS for any discharge medications will be authorized once you are discharged, as it is imperative that you return to your primary care physician (or establish a relationship with a primary care physician if you do not have one).   Discharge wound care:   Complete by: As directed    Increase activity slowly   Complete by: As directed        Discharge Medications:   Allergies as of 01/22/2024       Reactions   Haldol  [haloperidol ] Other (See Comments)   Makes drowsy   Morphine  Other (See Comments)   Mental status changes - angry, mean   Sulfa  Antibiotics Hives   Other Reaction(s): Unknown        Medication List     STOP taking these medications    apixaban  5 MG Tabs tablet Commonly known as: ELIQUIS    metolazone 2.5 MG tablet Commonly known as: ZAROXOLYN   Potassium Chloride  ER 20 MEQ Tbcr   valsartan -hydrochlorothiazide  80-12.5 MG tablet Commonly known as: DIOVAN -HCT       TAKE these medications    albuterol  108 (90 Base) MCG/ACT inhaler Commonly known as:  VENTOLIN  HFA Inhale 2 puffs into the lungs every 6 (six) hours as needed for wheezing or shortness of breath.  allopurinol  300 MG tablet Commonly known as: ZYLOPRIM  Take 1 tablet (300 mg total) by mouth daily.   ALPRAZolam  0.5 MG tablet Commonly known as: XANAX  Take 1 tablet (0.5 mg total) by mouth 2 (two) times daily as needed.   amLODipine  10 MG tablet Commonly known as: NORVASC  Take 10 mg by mouth daily.   atorvastatin  40 MG tablet Commonly known as: LIPITOR Take 1 tablet (40 mg total) by mouth every evening.   baclofen  10 MG tablet Commonly known as: LIORESAL  Take 1.5 tablets (15 mg total) by mouth 4 (four) times daily. X 1 week then increase to 20 mg QID- for spasticity with Tizanidine    carvedilol  25 MG tablet Commonly known as: COREG  Take 1 tablet (25 mg total) by mouth in the morning and at bedtime.   cefadroxil  500 MG capsule Commonly known as: DURICEF Take 2 capsules (1,000 mg total) by mouth 2 (two) times daily for 3 days.   diclofenac  Sodium 1 % Gel Commonly known as: VOLTAREN  Apply 4 g topically 4 (four) times daily. To bilateral knees   doxazosin  2 MG tablet Commonly known as: CARDURA  Take 1 tablet (2 mg total) by mouth at bedtime. What changed: when to take this   DULoxetine  30 MG capsule Commonly known as: CYMBALTA  Take 1 capsule by mouth once daily What changed: how much to take   furosemide  40 MG tablet Commonly known as: LASIX  Take 4 tablets (160 mg total) by mouth daily as needed for edema or fluid. What changed:  medication strength when to take this reasons to take this   hydrALAZINE  25 MG tablet Commonly known as: APRESOLINE  Take 25 mg by mouth 3 (three) times daily.   lidocaine  5 % Commonly known as: LIDODERM  Place 2 patches onto the skin daily at 10 pm. Remove & Discard patch within 12 hours or as directed by MD   Mounjaro  12.5 MG/0.5ML Pen Generic drug: tirzepatide  Inject 12.5 mg into the skin once a week.   omeprazole 40 MG  capsule Commonly known as: PRILOSEC Take 40 mg by mouth daily.   ondansetron  4 MG tablet Commonly known as: Zofran  Take 1 tablet (4 mg total) by mouth every 8 (eight) hours as needed for nausea or vomiting.   oxyCODONE -acetaminophen  10-325 MG tablet Commonly known as: Percocet Take 1 tablet by mouth 4 (four) times daily as needed for pain.   polyethylene glycol powder 17 GM/SCOOP powder Commonly known as: GLYCOLAX /MIRALAX  Take 17 g by mouth daily.   pramipexole  0.5 MG tablet Commonly known as: MIRAPEX  Take 1 tablet (0.5 mg total) by mouth in the morning and at bedtime.   tamsulosin  0.4 MG Caps capsule Commonly known as: FLOMAX  Take 1 capsule (0.4 mg total) by mouth at bedtime.   tiZANidine  4 MG tablet Commonly known as: Zanaflex  Start with 4 mg at bedtime x 4 days, then 4 mg BID x 4 days, then increase to 4 mg in AM and 8 mg at bedtime x 4 days, then 4 mg in AM, 4 mg in Afternoon and 8 mg at bedtime- for spasticity with baclofen    topiramate  25 MG tablet Commonly known as: Topamax  Take 1 tablet (25 mg total) by mouth 2 (two) times daily.   Vitamin D  (Ergocalciferol ) 1.25 MG (50000 UNIT) Caps capsule Commonly known as: DRISDOL  Take 1 capsule (50,000 Units total) by mouth every 14 (fourteen) days.               Discharge Care Instructions  (From admission, onward)  Start     Ordered   01/22/24 0000  Discharge wound care:        01/22/24 1006             The results of significant diagnostics from this hospitalization (including imaging, microbiology, ancillary and laboratory) are listed below for reference.    Procedures and Diagnostic Studies:   CT ABDOMEN PELVIS WO CONTRAST Result Date: 01/18/2024 CLINICAL DATA:  Acute abdominal pain.  Hematuria. EXAM: CT ABDOMEN AND PELVIS WITHOUT CONTRAST TECHNIQUE: Multidetector CT imaging of the abdomen and pelvis was performed following the standard protocol without IV contrast. RADIATION DOSE  REDUCTION: This exam was performed according to the departmental dose-optimization program which includes automated exposure control, adjustment of the mA and/or kV according to patient size and/or use of iterative reconstruction technique. COMPARISON:  CT abdomen and pelvis 05/21/2023 FINDINGS: Lower chest: No acute abnormality. Hepatobiliary: No focal liver abnormality is seen. No gallstones, gallbladder wall thickening, or biliary dilatation. Pancreas: Unremarkable. No pancreatic ductal dilatation or surrounding inflammatory changes. Spleen: Normal in size without focal abnormality. Adrenals/Urinary Tract: There is a cyst in the inferior pole the right kidney measuring 1.5 cm. There are additional subcentimeter hypodensities in both kidneys which are too small to characterize, likely cysts. No urinary tract calculi are seen there is no hydronephrosis. Adrenal glands are within normal limits. There is subtle layering hyperdensity within the posterior bladder which Semple represent hemorrhage or infection. The bladder otherwise appears within normal limits. Stomach/Bowel: Stomach is within normal limits. Appendix appears normal. No evidence of bowel wall thickening, distention, or inflammatory changes. There is sigmoid colon diverticulosis. Vascular/Lymphatic: Aortic atherosclerosis. No enlarged abdominal or pelvic lymph nodes. Reproductive: Prostate is unremarkable. Other: No abdominal wall hernia or abnormality. No abdominopelvic ascites. Musculoskeletal: There is severe degenerative changes and grade 1 anterolisthesis at T10-T11, unchanged. No acute fractures are seen. IMPRESSION: 1. Subtle layering hyperdensity within the posterior bladder Mcfaul represent hemorrhage or infection. Correlate with urinalysis. 2. No urinary tract calculi or hydronephrosis. 3. Sigmoid colon diverticulosis. Aortic Atherosclerosis (ICD10-I70.0). Electronically Signed   By: Greig Pique M.D.   On: 01/18/2024 17:06     Labs:   Basic  Metabolic Panel: Recent Labs  Lab 01/18/24 1423 01/19/24 0319 01/20/24 0932 01/21/24 0145 01/22/24 0141  NA 137 137 139 139 139  K 3.8 3.0* 2.9* 3.2* 3.1*  CL 92* 93* 90* 93* 96*  CO2 27 32 31 31 30   GLUCOSE 152* 117* 122* 138* 158*  BUN 49* 46* 48* 47* 48*  CREATININE 3.02* 2.68* 2.71* 2.82* 2.31*  CALCIUM  10.5* 9.8 10.3 9.8 9.3  MG  --  1.6*  --   --   --   PHOS  --   --   --   --  2.7   GFR Estimated Creatinine Clearance: 48.5 mL/min (A) (by C-G formula based on SCr of 2.31 mg/dL (H)). Liver Function Tests: Recent Labs  Lab 01/18/24 1423 01/19/24 0319 01/22/24 0141  AST 15 13*  --   ALT 17 15  --   ALKPHOS 82 75  --   BILITOT 0.6 0.8  --   PROT 8.6* 7.7  --   ALBUMIN  3.6 3.1* 2.6*   Recent Labs  Lab 01/18/24 1423  LIPASE 28   No results for input(s): AMMONIA in the last 168 hours. Coagulation profile Recent Labs  Lab 01/19/24 0319  INR 1.3*    CBC: Recent Labs  Lab 01/18/24 1423 01/19/24 0319 01/20/24 0932 01/21/24 0145 01/22/24 0141  WBC 14.6* 13.7* 11.4* 10.0 9.9  NEUTROABS  --   --   --  5.9 5.2  HGB 13.3 12.1* 13.0 12.2* 10.8*  HCT 40.6 36.6* 39.0 37.0* 32.6*  MCV 94.6 92.7 91.8 92.5 92.9  PLT 383 347 371 362 336   Cardiac Enzymes: Recent Labs  Lab 01/18/24 1423  CKTOTAL 26*   BNP: Invalid input(s): POCBNP CBG: No results for input(s): GLUCAP in the last 168 hours. D-Dimer No results for input(s): DDIMER in the last 72 hours. Hgb A1c No results for input(s): HGBA1C in the last 72 hours. Lipid Profile No results for input(s): CHOL, HDL, LDLCALC, TRIG, CHOLHDL, LDLDIRECT in the last 72 hours. Thyroid  function studies No results for input(s): TSH, T4TOTAL, T3FREE, THYROIDAB in the last 72 hours.  Invalid input(s): FREET3 Anemia work up No results for input(s): VITAMINB12, FOLATE, FERRITIN, TIBC, IRON, RETICCTPCT in the last 72 hours. Microbiology Recent Results (from the past 240  hours)  Urine Culture     Status: Abnormal   Collection Time: 01/18/24  5:24 PM   Specimen: Urine, Clean Catch  Result Value Ref Range Status   Specimen Description URINE, CLEAN CATCH  Final   Special Requests   Final    NONE Performed at [facility], [address] [place], [place] [address]    Culture 40,000 COLONIES/mL ESCHERICHIA COLI (A)  Final   Report Status 01/20/2024 FINAL  Final   Organism ID, Bacteria ESCHERICHIA COLI (A)  Final      Susceptibility   Escherichia coli - MIC*    AMPICILLIN >=32 RESISTANT Resistant     CEFAZOLIN  (URINE) Value in next row Sensitive      8 SENSITIVEThis is a modified FDA-approved test that has been validated and its performance characteristics determined by the reporting laboratory.  This laboratory is certified under the Clinical Laboratory Improvement Amendments CLIA as qualified to perform high complexity clinical laboratory testing.    CEFEPIME Value in next row Sensitive      8 SENSITIVEThis is a modified FDA-approved test that has been validated and its performance characteristics determined by the reporting laboratory.  This laboratory is certified under the Clinical Laboratory Improvement Amendments CLIA as qualified to perform high complexity clinical laboratory testing.    ERTAPENEM Value in next row Sensitive      8 SENSITIVEThis is a modified FDA-approved test that has been validated and its performance characteristics determined by the reporting laboratory.  This laboratory is certified under the Clinical Laboratory Improvement Amendments CLIA as qualified to perform high complexity clinical laboratory testing.    CEFTRIAXONE  Value in next row Sensitive      8 SENSITIVEThis is a modified FDA-approved test that has been validated and its performance characteristics determined by the reporting laboratory.  This laboratory is certified under the Clinical Laboratory Improvement Amendments CLIA as qualified to perform high complexity  clinical laboratory testing.    CIPROFLOXACIN Value in next row Sensitive      8 SENSITIVEThis is a modified FDA-approved test that has been validated and its performance characteristics determined by the reporting laboratory.  This laboratory is certified under the Clinical Laboratory Improvement Amendments CLIA as qualified to perform high complexity clinical laboratory testing.    GENTAMICIN Value in next row Resistant      8 SENSITIVEThis is a modified FDA-approved test that has been validated and its performance characteristics determined by the reporting laboratory.  This laboratory is certified under the Clinical Laboratory Improvement Amendments CLIA as qualified  to perform high complexity clinical laboratory testing.    NITROFURANTOIN  Value in next row Sensitive      8 SENSITIVEThis is a modified FDA-approved test that has been validated and its performance characteristics determined by the reporting laboratory.  This laboratory is certified under the Clinical Laboratory Improvement Amendments CLIA as qualified to perform high complexity clinical laboratory testing.    TRIMETH /SULFA  Value in next row Resistant      8 SENSITIVEThis is a modified FDA-approved test that has been validated and its performance characteristics determined by the reporting laboratory.  This laboratory is certified under the Clinical Laboratory Improvement Amendments CLIA as qualified to perform high complexity clinical laboratory testing.    AMPICILLIN/SULBACTAM Value in next row Intermediate      8 SENSITIVEThis is a modified FDA-approved test that has been validated and its performance characteristics determined by the reporting laboratory.  This laboratory is certified under the Clinical Laboratory Improvement Amendments CLIA as qualified to perform high complexity clinical laboratory testing.    PIP/TAZO Value in next row Sensitive ug/mL     <=4 SENSITIVEThis is a modified FDA-approved test that has been validated  and its performance characteristics determined by the reporting laboratory.  This laboratory is certified under the Clinical Laboratory Improvement Amendments CLIA as qualified to perform high complexity clinical laboratory testing.    MEROPENEM Value in next row Sensitive      <=4 SENSITIVEThis is a modified FDA-approved test that has been validated and its performance characteristics determined by the reporting laboratory.  This laboratory is certified under the Clinical Laboratory Improvement Amendments CLIA as qualified to perform high complexity clinical laboratory testing.    * 40,000 COLONIES/mL ESCHERICHIA COLI    Time coordinating discharge: 45 minutes  Signed: Jeannelle Wiens  [facility] 01/22/2024, 10:07 AM

## 2024-01-22 NOTE — Plan of Care (Signed)
 Patient calm and cooperative A&O X4. Foam on lower back changed, PIV maintained, medications tolerated well. Patient left with call bell in reach, bed in lowest position and side rails up. Wound on right heel  with scant drainage. Patient left with call bell in reach, bed in lowest position and side rails up.   Problem: Education: Goal: Knowledge of General Education information will improve Description: Including pain rating scale, medication(s)/side effects and non-pharmacologic comfort measures Outcome: Progressing   Problem: Health Behavior/Discharge Planning: Goal: Ability to manage health-related needs will improve Outcome: Progressing   Problem: Clinical Measurements: Goal: Ability to maintain clinical measurements within normal limits will improve Outcome: Progressing   Problem: Activity: Goal: Risk for activity intolerance will decrease Outcome: Progressing   Problem: Coping: Goal: Level of anxiety will decrease Outcome: Progressing   Problem: Elimination: Goal: Will not experience complications related to bowel motility Outcome: Progressing   Problem: Pain Managment: Goal: General experience of comfort will improve and/or be controlled Outcome: Progressing   Problem: Safety: Goal: Ability to remain free from injury will improve Outcome: Progressing

## 2024-01-25 NOTE — Telephone Encounter (Signed)
 Mounjaro  approved through insurance. No coverage dates were provided via covermymeds.

## 2024-02-03 ENCOUNTER — Encounter: Payer: Self-pay | Admitting: Physical Therapy

## 2024-02-03 ENCOUNTER — Encounter: Attending: Physical Medicine and Rehabilitation | Admitting: Physical Medicine and Rehabilitation

## 2024-02-03 ENCOUNTER — Ambulatory Visit: Payer: PRIVATE HEALTH INSURANCE | Attending: Physical Medicine and Rehabilitation | Admitting: Physical Therapy

## 2024-02-03 DIAGNOSIS — Z5181 Encounter for therapeutic drug level monitoring: Secondary | ICD-10-CM | POA: Insufficient documentation

## 2024-02-03 DIAGNOSIS — M25511 Pain in right shoulder: Secondary | ICD-10-CM | POA: Insufficient documentation

## 2024-02-03 DIAGNOSIS — M25561 Pain in right knee: Secondary | ICD-10-CM | POA: Insufficient documentation

## 2024-02-03 DIAGNOSIS — R2681 Unsteadiness on feet: Secondary | ICD-10-CM | POA: Diagnosis present

## 2024-02-03 DIAGNOSIS — M545 Low back pain, unspecified: Secondary | ICD-10-CM | POA: Insufficient documentation

## 2024-02-03 DIAGNOSIS — M7061 Trochanteric bursitis, right hip: Secondary | ICD-10-CM | POA: Insufficient documentation

## 2024-02-03 DIAGNOSIS — G8929 Other chronic pain: Secondary | ICD-10-CM | POA: Diagnosis present

## 2024-02-03 DIAGNOSIS — M6281 Muscle weakness (generalized): Secondary | ICD-10-CM | POA: Diagnosis present

## 2024-02-03 DIAGNOSIS — M542 Cervicalgia: Secondary | ICD-10-CM | POA: Insufficient documentation

## 2024-02-03 DIAGNOSIS — M25562 Pain in left knee: Secondary | ICD-10-CM | POA: Insufficient documentation

## 2024-02-03 DIAGNOSIS — M25512 Pain in left shoulder: Secondary | ICD-10-CM | POA: Insufficient documentation

## 2024-02-03 DIAGNOSIS — G894 Chronic pain syndrome: Secondary | ICD-10-CM | POA: Insufficient documentation

## 2024-02-03 DIAGNOSIS — Z993 Dependence on wheelchair: Secondary | ICD-10-CM | POA: Insufficient documentation

## 2024-02-03 DIAGNOSIS — M5412 Radiculopathy, cervical region: Secondary | ICD-10-CM | POA: Insufficient documentation

## 2024-02-03 DIAGNOSIS — Z79891 Long term (current) use of opiate analgesic: Secondary | ICD-10-CM | POA: Insufficient documentation

## 2024-02-03 DIAGNOSIS — R2689 Other abnormalities of gait and mobility: Secondary | ICD-10-CM | POA: Diagnosis present

## 2024-02-03 DIAGNOSIS — M7062 Trochanteric bursitis, left hip: Secondary | ICD-10-CM | POA: Insufficient documentation

## 2024-02-03 NOTE — Therapy (Signed)
 OUTPATIENT PHYSICAL THERAPY THORACIC/AQUATIC THERAPY NOTE     Patient Name: Raymond [name] MRN: [id number] DOB:[birth year], 58 y.o., male Today's Date: 02/03/2024  END OF SESSION:  PT End of Session - 02/03/24 1936     Visit Number 26    Number of Visits 27    Date for PT Re-Evaluation 03/01/24    Authorization Type [employer] Managed    PT Start Time 1300    PT Stop Time 1400    PT Time Calculation (min) 60 min    Equipment Utilized During Treatment Other (comment)   water walker   Activity Tolerance Patient tolerated treatment well    Behavior During Therapy WFL for tasks assessed/performed            Past Medical History:  Diagnosis Date   Acute kidney injury superimposed on chronic kidney disease (HCC) 04/13/2023   Acute on chronic anemia 04/13/2023   Anemia    Anxiety    Back pain    COPD (chronic obstructive pulmonary disease) (HCC)    Depression    Diabetes mellitus without complication (HCC)    Diverticulosis    with hx of LGIB   Edema    GERD (gastroesophageal reflux disease)    GIB (gastrointestinal bleeding)    Gout    Heavy smoker    Hyperlipidemia    Hypertension    Iron deficiency anemia    Sleep apnea    uses a cpap   Swelling of lower extremity    Past Surgical History:  Procedure Laterality Date   CHONDROPLASTY Left 06/28/2014   Procedure: CHONDROPLASTY;  Surgeon: LELON JONETTA Shari Mickey., MD;  Location: [facility];  Service: Orthopedics;  Laterality: Left;   COLONOSCOPY     FOOT FASCIOTOMY     age 62-rt   KNEE ARTHROSCOPY WITH LATERAL MENISECTOMY Left 06/28/2014   Procedure: KNEE ARTHROSCOPY WITH LATERAL MENISECTOMY;  Surgeon: LELON JONETTA Shari Mickey., MD;  Location: [facility];  Service: Orthopedics;  Laterality: Left;   KNEE ARTHROSCOPY WITH MEDIAL MENISECTOMY Left 06/28/2014   Procedure: LEFT KNEE ARTHROSCOPY CHONDROPLASTY/WITH MEDIAL/LATERAL MENISECTOMIES;  Surgeon: LELON JONETTA Shari Mickey., MD;  Location:  SURGERY CENTER;  Service:  Orthopedics;  Laterality: Left;   ORIF RADIUS & ULNA FRACTURES  2007   left   THORACIC DISCECTOMY N/A 03/12/2023   Procedure: THORACIC LAMINECTOMY AND  DISCECTOMY;  Surgeon: Gillie Duncans, MD;  Location: [facility];  Service: Neurosurgery;  Laterality: N/A;   Patient Active Problem List   Diagnosis Date Noted   Hematuria 01/18/2024   Chronic incomplete paraplegia (HCC) 10/19/2023   Spasticity 10/19/2023   Wheelchair dependence 10/19/2023   Cystitis 05/21/2023   Acute diverticulitis 05/21/2023   Vitamin D  deficiency 05/14/2023   Low HDL (under 40) 05/14/2023   GERD (gastroesophageal reflux disease) 04/13/2023   Constipation 04/13/2023   Chronic back pain 04/13/2023   DVT, bilateral lower limbs (HCC) 04/13/2023   Coping style affecting medical condition 03/27/2023   Thoracic myelopathy with LE weakness 03/23/2023   HNP (herniated nucleus pulposus with myelopathy), thoracic 03/12/2023   Hyperlipidemia 12/24/2022   Failure to thrive  in adult 12/24/2022   Tobacco abuse 03/18/2018   BPH associated with nocturia 03/11/2017   History of substance abuse (HCC) 03/11/2017   Restless leg syndrome 03/11/2017   Restrictive lung disease 05/14/2016   OSA on CPAP 05/14/2016   Moderate COPD (chronic obstructive pulmonary disease) (HCC) 05/14/2016   Smoking greater than 40 pack years 05/14/2016   SOB (shortness  of breath) 10/06/2014   Edema of extremities 10/06/2014   Benign essential HTN 10/06/2014   Type 2 diabetes mellitus, without long-term current use of insulin  (HCC) 07/18/2011    PCP: [name], MD  REFERRING PROVIDER:  Lorilee Railing, MD   REFERRING DIAG: [phone] (ICD-10-CM) - Other spondylosis with myelopathy, thoracic region   THERAPY DIAG:  Other abnormalities of gait and mobility  Muscle weakness (generalized)  Chronic pain of right knee  Chronic pain of left knee  Rationale for Evaluation and Treatment: Rehabilitation  ONSET DATE: October  2024  SUBJECTIVE:                                                                                                                                                                                                         SUBJECTIVE STATEMENT:  Pt reports he stood 4-5 in PT yesterday; say his legs feel tight today -they Thueson be tired  Hand dominance: Right  PERTINENT HISTORY:  Broken R ankle x2; history of COPD, T2DM, OSA, tobacco use, CKD 111a, morbid obesity who was admitted on 03/11/2023 with BLE weakness with decreased coordination and gait disorder as well as reports of urinary incontinence. He was found to have large HNP T10/T11 and underwent T10 laminectomy with discectomy by Dr. Gillie on 03/12/2023   PAIN:  Are you having pain? Yes: NPRS scale: 3/10 Pain location: toothache  Pain description: throbbing  Aggravating factors: nothing in PT  Relieving factors: nothing in PT    PRECAUTIONS: None  RED FLAGS: None     WEIGHT BEARING RESTRICTIONS: No  FALLS:  Has patient fallen in last 6 months? Yes. Number of falls 1 -- last fall Oct 2024  LIVING ENVIRONMENT: Lives with: lives with their spouse Lives in: House/apartment Stairs: ramp Has following equipment at home: Environmental consultant - 2 wheeled, Wheelchair (manual), and upright walker, hospital bed  OCCUPATION: Retired  PLOF: Able to do his own ADLs but easier to have wife assist him  PATIENT GOALS: Walk with the walker at least household distances  NEXT MD VISIT: 04/21/23 [place]  OBJECTIVE:  Note: Objective measures were completed at Evaluation unless otherwise noted.  DIAGNOSTIC FINDINGS:  03/12/23 MRI THORACIC SPINE IMPRESSION:   1. Moderate-sized lobulated left subarticular to foraminal disc protrusion at T10-11 with resultant severe spinal stenosis. Cord is compressed and deviated to the right at this level. Associated cord signal changes concerning for edema and/or myelomalacia. 2. No other acute abnormality  within the thoracic spine. 3. Degenerative spondylosis at T8-9 through T11-12 without significant spinal stenosis. Associated moderate to  severe bilateral foraminal narrowing at these levels as above.  MRI LUMBAR SPINE IMPRESSION:   1. No acute abnormality within the lumbar spine. 2. Multifactorial degenerative changes at L4-5 with resultant mild canal with severe left and moderate right lateral recess stenosis, with severe bilateral L4 foraminal narrowing. 3. Additional mild noncompressive disc bulging and facet hypertrophy elsewhere within the lumbar spine as above. No other significant stenosis or frank neural impingement.   Critical Value/emergent results were called by telephone at the time of interpretation on 03/12/2023 at 12:41 am to provider Dr. Griselda, who verbally acknowledged these results.  PATIENT SURVEYS:  FOTO 42; predicted 54; 06/15/23- 27; 07/15/23- 37   COGNITION: Overall cognitive status: Within functional limits for tasks assessed  SENSATION: WFL  POSTURE: rounded shoulders and forward head  PALPATION: No overt tenderness to palpation   CERVICAL ROM: WNL  LOWER EXTREMITY MMT:    MMT Right eval Left eval Right 07/15/23 Left 07/15/23 Right 08/17/23 Left  08/17/23  Hip flexion 3+ 3 4+ 3 4 4+  Hip extension 3+ 3+      Hip abduction 3+ 3+ 4 4 4 4   Hip adduction        Hip internal rotation        Hip external rotation        Knee flexion 4- 3+   3+ 3+  Knee extension 4- 3+ 4+ 4+ 3+ 4+  Ankle dorsiflexion   4+ 4+ 4+ 4  Ankle plantarflexion        Ankle inversion        Ankle eversion         (Blank rows = not tested)   FUNCTIONAL TESTS:  Transfers:  Chair to bed: Multiple seated scoots with UEs  Sit<>stand: max A -- limited due to knee pain Supine to sit<>sit: min A for trunk and LE negotiation. Not performing log rolling  TODAY'S TREATMENT:                                                                                                                               DATE:  02-03-24  Aquatic PT at [facility] - pool temp 90 degrees  Patient seen for aquatic therapy today.  Treatment took place in water 3.6-4.5 feet deep depending upon activity.  Pt entered and exited the pool via chair lift. Pt transferred from wheelchair to chair lift without use of sliding board with CGA to min assist;  transferred from chair lift to wheelchair with sliding board with min assist  for LE positioning when exiting pool.   Pt performed static standing at pool wall with cues for knee extension - bil. UE support on pool edge  Gait training with water walker approx. 8 laps (18' x 16 ) with CGA to SBA;  Sidestepping with water walker 18' x 4 reps; backwards amb. With water walker 18' x 4 reps with water walker  Pt performed static standing with UE support on  water walker   Marching in place - 10 reps x 2 sets each leg holding onto water walker  Squats x 10 reps - holding onto water walker  Sitting on bench in pool - pt performed bicycling LE's 10 reps; performed hip abduction 10 reps with knees extended; LAQ's 10 reps each leg   Pt performed standing balance exercises - alternating hip flexion, extension, and abduction 5 reps each leg - with UE support on water walker, stabilized by PT  Pt requires buoyancy of water for support for reduced fall risk and for unloading/reduced stress on joints (B knees) as pt able to tolerate increased standing and ambulation in water compared to that on land; viscosity of water is needed for resistance for strengthening and current of water provides perturbations for challenge for balance training           PATIENT EDUCATION:  Education details: aquatic exercises Person educated: Patient Education method: Programmer, multimedia, Demonstration, and Handouts Education comprehension: verbalized understanding, returned demonstration, and needs further education  HOME EXERCISE PROGRAM: Access Code: [email or web address] URL:  [email or web address] Date: 04/21/2023 Prepared by: Gellen April Marie Nonato  Exercises - Staggered Bridge  - 1 x daily - 7 x weekly - 2 sets - 10 reps - Hooklying Isometric Clamshell  - 1 x daily - 7 x weekly - 2 sets - 10 reps - Small Range Straight Leg Raise  - 1 x daily - 7 x weekly - 2 sets - 10 reps  ASSESSMENT:  CLINICAL IMPRESSION:  Aquatic PT session focused on water walking with use of water walker for UE support.  Session also focused on LE strengthening with pt reporting having tightness/spasms/cramping in LE's.  Tightness minimally resolved with seated LE ROM exercises in non-weight bearing position (on pool bench with buoyancy assisting in increasing ROM).  Pt continues to have moderate to severe pain in each leg with SLS exercises - knee instability in each leg occurred intermittently with SLS exercise in today's session.  Pt is progressing well with aquatic exercise.  Cont with POC.    OBJECTIVE IMPAIRMENTS: Abnormal gait, decreased balance, decreased endurance, decreased mobility, difficulty walking, decreased ROM, decreased strength, increased edema, improper body mechanics, postural dysfunction, obesity, and pain.   GOALS: Goals reviewed with patient? Yes  LONG TERM GOALS: Target date: 09/28/2023       Pt will be ind with management and progression of HEP Baseline:  Goal status: ONGOING 07/15/23; Ongoing 10-05-23  2.  Pt will be able to perform stand-pivot transfers with RW and no more than MinA  Baseline:  Goal status: GOAL MODIFIED  07/15/23; Not met 10-05-23  3.  Pt will be able to perform at least 2 reps of sit to stand in 60 sec with UE support Baseline: Unable to perform complete STS without mod A Goal status: GOAL MODIFIED 07/15/23; Goal not met 10-05-23  4.  Pt will have increased FOTO to >/=54 Baseline:  Goal status: ONGOING 07/15/23; Deferred 10-05-23   NEW Short term goals:  Target date 10-30-23 = 4 weeks    1.  Pt will amb. 2 laps  across width of pool (18' x 4 reps) with water walker with +1 min assist.    Baseline:     Goal status:  Goal met 11-04-23    2.  Pt will report ability to stand at home with UE support on counter or at deck railing with SBA for at least 1 for LE weight bearing for tone management.  Baseline:           Goal status:  Goal met 11-04-23  NEW Long term goals:  Target date 11-27-23 = 8 weeks    1.  Independent in HEP for aquatic exercises to be continued after D/C from PT.    Baseline:    Goal status:  Ongoing    2.  Pt will ambulate 3 laps (18' x 6 reps) across width of pool with large barbell with mod assist +1.    Baseline;    Goal status:  Goal met 12-31-23   NEW Long term goals:  Target date 01-29-24= 4 weeks    1.  Independent in HEP for aquatic exercises to be continued after D/C from PT.    Baseline:    Goal status:  Ongoing    2.  Pt will amb. 1 lap across pool with noodle with min assist to be able to continue aquatic HEP with wife's assist.    Baseline:     Goal status:  Ongoing - water walker needed in session on 02-03-24  PLAN:   PT FREQUENCY: 1x/week  PT DURATION: 4 weeks (per renewal 12-30-23)  PLANNED INTERVENTIONS: 02835- PT Re-evaluation, 97110-Therapeutic exercises, 97530- Therapeutic activity, 97112- Neuromuscular re-education, 97535- Self Care, 02859- Manual therapy, [phone]- Gait training, [phone]- Orthotic Fit/training, [phone]- Aquatic Therapy, 97014- Electrical stimulation (unattended), 97035- Ultrasound, 02966- Ionotophoresis 4mg /ml Dexamethasone , Patient/Family education, Balance training, Stair training, Taping, Dry Needling, Joint mobilization, Spinal mobilization, Cryotherapy, and Moist heat  PLAN FOR NEXT SESSION: focus on water PT for maximal functional gains  Elvie Kussmaul, PT 02/03/24 7:41 PM

## 2024-02-10 ENCOUNTER — Ambulatory Visit: Admitting: Physical Therapy

## 2024-02-10 ENCOUNTER — Ambulatory Visit: Payer: PRIVATE HEALTH INSURANCE | Admitting: Physical Therapy

## 2024-02-10 DIAGNOSIS — R2689 Other abnormalities of gait and mobility: Secondary | ICD-10-CM | POA: Diagnosis not present

## 2024-02-10 DIAGNOSIS — R2681 Unsteadiness on feet: Secondary | ICD-10-CM

## 2024-02-10 DIAGNOSIS — M6281 Muscle weakness (generalized): Secondary | ICD-10-CM

## 2024-02-10 DIAGNOSIS — G8929 Other chronic pain: Secondary | ICD-10-CM

## 2024-02-11 ENCOUNTER — Encounter: Payer: Self-pay | Admitting: Physical Therapy

## 2024-02-11 NOTE — Therapy (Signed)
 OUTPATIENT PHYSICAL THERAPY THORACIC/AQUATIC THERAPY NOTE     Patient Name: Raymond [name] MRN: [id number] DOB:30-Apr-1966, 58 y.o., male Today's Date: 02/11/2024  END OF SESSION:  PT End of Session - 02/11/24 1953     Visit Number 27    Number of Visits 31    Date for PT Re-Evaluation 03/01/24    Authorization Type [employer] Managed    PT Start Time 1315    PT Stop Time 1400    PT Time Calculation (min) 45 min    Equipment Utilized During Treatment Other (comment)   water walker   Activity Tolerance Patient tolerated treatment well    Behavior During Therapy WFL for tasks assessed/performed            Past Medical History:  Diagnosis Date   Acute kidney injury superimposed on chronic kidney disease (HCC) 04/13/2023   Acute on chronic anemia 04/13/2023   Anemia    Anxiety    Back pain    COPD (chronic obstructive pulmonary disease) (HCC)    Depression    Diabetes mellitus without complication (HCC)    Diverticulosis    with hx of LGIB   Edema    GERD (gastroesophageal reflux disease)    GIB (gastrointestinal bleeding)    Gout    Heavy smoker    Hyperlipidemia    Hypertension    Iron deficiency anemia    Sleep apnea    uses a cpap   Swelling of lower extremity    Past Surgical History:  Procedure Laterality Date   CHONDROPLASTY Left 06/28/2014   Procedure: CHONDROPLASTY;  Surgeon: LELON JONETTA Shari Mickey., MD;  Location: [facility];  Service: Orthopedics;  Laterality: Left;   COLONOSCOPY     FOOT FASCIOTOMY     age 28-rt   KNEE ARTHROSCOPY WITH LATERAL MENISECTOMY Left 06/28/2014   Procedure: KNEE ARTHROSCOPY WITH LATERAL MENISECTOMY;  Surgeon: LELON JONETTA Shari Mickey., MD;  Location: [facility];  Service: Orthopedics;  Laterality: Left;   KNEE ARTHROSCOPY WITH MEDIAL MENISECTOMY Left 06/28/2014   Procedure: LEFT KNEE ARTHROSCOPY CHONDROPLASTY/WITH MEDIAL/LATERAL MENISECTOMIES;  Surgeon: LELON JONETTA Shari Mickey., MD;  Location: [facility];  Service:  Orthopedics;  Laterality: Left;   ORIF RADIUS & ULNA FRACTURES  2007   left   THORACIC DISCECTOMY N/A 03/12/2023   Procedure: THORACIC LAMINECTOMY AND  DISCECTOMY;  Surgeon: Gillie Duncans, MD;  Location: [facility] OR;  Service: Neurosurgery;  Laterality: N/A;   Patient Active Problem List   Diagnosis Date Noted   Hematuria 01/18/2024   Chronic incomplete paraplegia (HCC) 10/19/2023   Spasticity 10/19/2023   Wheelchair dependence 10/19/2023   Cystitis 05/21/2023   Acute diverticulitis 05/21/2023   Vitamin D  deficiency 05/14/2023   Low HDL (under 40) 05/14/2023   GERD (gastroesophageal reflux disease) 04/13/2023   Constipation 04/13/2023   Chronic back pain 04/13/2023   DVT, bilateral lower limbs (HCC) 04/13/2023   Coping style affecting medical condition 03/27/2023   Thoracic myelopathy with LE weakness 03/23/2023   HNP (herniated nucleus pulposus with myelopathy), thoracic 03/12/2023   Hyperlipidemia 12/24/2022   Failure to thrive  in adult 12/24/2022   Tobacco abuse 03/18/2018   BPH associated with nocturia 03/11/2017   History of substance abuse (HCC) 03/11/2017   Restless leg syndrome 03/11/2017   Restrictive lung disease 05/14/2016   OSA on CPAP 05/14/2016   Moderate COPD (chronic obstructive pulmonary disease) (HCC) 05/14/2016   Smoking greater than 40 pack years 05/14/2016   SOB (shortness  of breath) 10/06/2014   Edema of extremities 10/06/2014   Benign essential HTN 10/06/2014   Type 2 diabetes mellitus, without long-term current use of insulin  (HCC) 07/18/2011    PCP: [name], MD  REFERRING PROVIDER:  Lorilee Railing, MD   REFERRING DIAG: [phone] (ICD-10-CM) - Other spondylosis with myelopathy, thoracic region   THERAPY DIAG:  Other abnormalities of gait and mobility  Muscle weakness (generalized)  Unsteadiness on feet  Chronic pain of right knee  Chronic pain of left knee  Rationale for Evaluation and Treatment: Rehabilitation  ONSET DATE:  October 2024  SUBJECTIVE:                                                                                                                                                                                                         SUBJECTIVE STATEMENT:  Pt reports he hasn't done as well in PT this week as what he did last week; says his legs have swollen some this week - redness in bil. LE's noted   Hand dominance: Right  PERTINENT HISTORY:  Broken R ankle x2; history of COPD, T2DM, OSA, tobacco use, CKD 111a, morbid obesity who was admitted on 03/11/2023 with BLE weakness with decreased coordination and gait disorder as well as reports of urinary incontinence. He was found to have large HNP T10/T11 and underwent T10 laminectomy with discectomy by Dr. Gillie on 03/12/2023   PAIN:  Are you having pain? Yes: NPRS scale: 4-5/10 Pain location: chronic pain in knees Pain description: throbbing  Aggravating factors: weightbearing  Relieving factors: sitting   PRECAUTIONS: None  RED FLAGS: None     WEIGHT BEARING RESTRICTIONS: No  FALLS:  Has patient fallen in last 6 months? Yes. Number of falls 1 -- last fall Oct 2024  LIVING ENVIRONMENT: Lives with: lives with their spouse Lives in: House/apartment Stairs: ramp Has following equipment at home: Environmental consultant - 2 wheeled, Wheelchair (manual), and upright walker, hospital bed  OCCUPATION: Retired  PLOF: Able to do his own ADLs but easier to have wife assist him  PATIENT GOALS: Walk with the walker at least household distances  NEXT MD VISIT: 04/21/23 [place]  OBJECTIVE:  Note: Objective measures were completed at Evaluation unless otherwise noted.  DIAGNOSTIC FINDINGS:  03/12/23 MRI THORACIC SPINE IMPRESSION:   1. Moderate-sized lobulated left subarticular to foraminal disc protrusion at T10-11 with resultant severe spinal stenosis. Cord is compressed and deviated to the right at this level. Associated cord signal changes  concerning for edema and/or myelomalacia. 2. No other acute abnormality within the thoracic  spine. 3. Degenerative spondylosis at T8-9 through T11-12 without significant spinal stenosis. Associated moderate to severe bilateral foraminal narrowing at these levels as above.  MRI LUMBAR SPINE IMPRESSION:   1. No acute abnormality within the lumbar spine. 2. Multifactorial degenerative changes at L4-5 with resultant mild canal with severe left and moderate right lateral recess stenosis, with severe bilateral L4 foraminal narrowing. 3. Additional mild noncompressive disc bulging and facet hypertrophy elsewhere within the lumbar spine as above. No other significant stenosis or frank neural impingement.   Critical Value/emergent results were called by telephone at the time of interpretation on 03/12/2023 at 12:41 am to provider Dr. Griselda, who verbally acknowledged these results.  PATIENT SURVEYS:  FOTO 42; predicted 54; 06/15/23- 27; 07/15/23- 37   COGNITION: Overall cognitive status: Within functional limits for tasks assessed  SENSATION: WFL  POSTURE: rounded shoulders and forward head  PALPATION: No overt tenderness to palpation   CERVICAL ROM: WNL  LOWER EXTREMITY MMT:    MMT Right eval Left eval Right 07/15/23 Left 07/15/23 Right 08/17/23 Left  08/17/23  Hip flexion 3+ 3 4+ 3 4 4+  Hip extension 3+ 3+      Hip abduction 3+ 3+ 4 4 4 4   Hip adduction        Hip internal rotation        Hip external rotation        Knee flexion 4- 3+   3+ 3+  Knee extension 4- 3+ 4+ 4+ 3+ 4+  Ankle dorsiflexion   4+ 4+ 4+ 4  Ankle plantarflexion        Ankle inversion        Ankle eversion         (Blank rows = not tested)   FUNCTIONAL TESTS:  Transfers:  Chair to bed: Multiple seated scoots with UEs  Sit<>stand: max A -- limited due to knee pain Supine to sit<>sit: min A for trunk and LE negotiation. Not performing log rolling  TODAY'S TREATMENT:                                                                                                                               DATE:  02-10-24  Aquatic PT at [facility] - pool temp 90 degrees  Patient seen for aquatic therapy today.  Treatment took place in water 3.6-4.5 feet deep depending upon activity.  Pt entered and exited the pool via chair lift. Pt transferred from wheelchair to chair lift without use of sliding board with CGA to min assist;  transferred from chair lift to wheelchair with sliding board with min assist  for LE positioning when exiting pool.   Pt performed static standing at pool wall with cues for knee extension - bil. UE support on pool edge;  Gait training with water walker approx. 8 laps (18' x 16 ) with CGA to SBA;  Sidestepping with water walker 18' x 4 reps; backwards amb. With water walker 18'  x 4 reps with water walker  Pt performed static standing with UE support on water walker; near end of session pt stood without UE support (held hands up out of water) - stood for approx.15 secs static standing  Marching in place - 10 reps x 2 sets each leg holding onto water walker  Squats x 10 reps - holding onto water walker  Pt performed standing balance exercises - alternating hip flexion, extension, and abduction 10 reps each leg - with UE support on water walker, stabilized by PT  Pt amb. With noodle only 18' x 4 reps at end of session with CGA to SBA  Pt requires buoyancy of water for support for reduced fall risk and for unloading/reduced stress on joints (B knees) as pt able to tolerate increased standing and ambulation in water compared to that on land; viscosity of water is needed for resistance for strengthening and current of water provides perturbations for challenge for balance training           PATIENT EDUCATION:  Education details: aquatic exercises Person educated: Patient Education method: Programmer, multimedia, Demonstration, and Handouts Education comprehension: verbalized  understanding, returned demonstration, and needs further education  HOME EXERCISE PROGRAM: Access Code: [email or web address] URL: [email or web address] Date: 04/21/2023 Prepared by: Gellen April Marie Nonato  Exercises - Staggered Bridge  - 1 x daily - 7 x weekly - 2 sets - 10 reps - Hooklying Isometric Clamshell  - 1 x daily - 7 x weekly - 2 sets - 10 reps - Small Range Straight Leg Raise  - 1 x daily - 7 x weekly - 2 sets - 10 reps  ASSESSMENT:  CLINICAL IMPRESSION:  Aquatic PT session focused on water walking with use of water walker for UE support during majority of session; noodle was trialed at end of session and pt was able to ambulate with noodle for UE support with min assist initially, decreasing to only CGA to SBA as pt acclimated to this flotation device.  LTG's #1 and 2 are ongoing as pt is making excellent progress with aquatic therapy with noodle used for assist with water walking for 1st time in today's session.  Pt continues to perform transfers from wheelchair to pool chair lift using lateral scoot transfer but no sliding board (min assist needed for safety).  Plan to renew for 4 aquatic PT sessions.     OBJECTIVE IMPAIRMENTS: Abnormal gait, decreased balance, decreased endurance, decreased mobility, difficulty walking, decreased ROM, decreased strength, increased edema, improper body mechanics, postural dysfunction, obesity, and pain.   GOALS: Goals reviewed with patient? Yes  LONG TERM GOALS: Target date: 09/28/2023       Pt will be ind with management and progression of HEP Baseline:  Goal status: ONGOING 07/15/23; Ongoing 10-05-23  2.  Pt will be able to perform stand-pivot transfers with RW and no more than MinA  Baseline:  Goal status: GOAL MODIFIED  07/15/23; Not met 10-05-23  3.  Pt will be able to perform at least 2 reps of sit to stand in 60 sec with UE support Baseline: Unable to perform complete STS without mod A Goal status: GOAL MODIFIED 07/15/23;  Goal not met 10-05-23  4.  Pt will have increased FOTO to >/=54 Baseline:  Goal status: ONGOING 07/15/23; Deferred 10-05-23   NEW Short term goals:  Target date 10-30-23 = 4 weeks    1.  Pt will amb. 2 laps across width of pool (18' x 4 reps) with water walker with +1 min  assist.    Baseline:     Goal status:  Goal met 11-04-23    2.  Pt will report ability to stand at home with UE support on counter or at deck railing with SBA for at least 1 for LE weight bearing for tone management.           Baseline:           Goal status:  Goal met 11-04-23  NEW Long term goals:  Target date 11-27-23 = 8 weeks    1.  Independent in HEP for aquatic exercises to be continued after D/C from PT.    Baseline:    Goal status:  Ongoing    2.  Pt will ambulate 3 laps (18' x 6 reps) across width of pool with large barbell with mod assist +1.    Baseline;    Goal status:  Goal met 12-31-23   NEW Long term goals:  Target date 01-29-24= 4 weeks    1.  Independent in HEP for aquatic exercises to be continued after D/C from PT.    Baseline:    Goal status:  Ongoing 02-10-24    2.  Pt will amb. 1 lap across pool with noodle with min assist to be able to continue aquatic HEP with wife's assist.    Baseline:     Goal status:  Ongoing - water walker needed in session on 02-03-24   NEW Long term goals:  Target date 03-11-24= 4 weeks    1.  Independent in HEP for aquatic exercises to be continued after D/C from PT.    Baseline:    Goal status:  Ongoing 02-10-24    2.  Pt will amb. 1 lap across pool with noodle with supervision to be able to continue aquatic HEP with wife's assist.    Baseline:     Goal status:  Updated     3.  Pt will stand in 4.5' water depth without UE support for 1 to demo improved standing balance.     Baseline:  pt able to stand approx. 15 secs without UE support on 02-10-24    Goal status:  NEW  PLAN:   PT FREQUENCY: 1x/week  PT DURATION: 4 weeks (per renewal 02-10-24)  PLANNED  INTERVENTIONS: 02835- PT Re-evaluation, 97110-Therapeutic exercises, 97530- Therapeutic activity, 97112- Neuromuscular re-education, 97535- Self Care, 02859- Manual therapy, [phone]- Gait training, [phone]- Orthotic Fit/training, [phone]- Aquatic Therapy, [phone]- Electrical stimulation (unattended), [id number]- Ultrasound, 02966- Ionotophoresis 4mg /ml Dexamethasone , Patient/Family education, Balance training, Stair training, Taping, Dry Needling, Joint mobilization, Spinal mobilization, Cryotherapy, and Moist heat  PLAN FOR NEXT SESSION: focus on water PT for maximal functional gains  Elvie Kussmaul, PT 02/11/24 8:01 PM

## 2024-02-22 ENCOUNTER — Ambulatory Visit (INDEPENDENT_AMBULATORY_CARE_PROVIDER_SITE_OTHER): Admitting: Bariatrics

## 2024-02-22 ENCOUNTER — Encounter: Payer: Self-pay | Admitting: Bariatrics

## 2024-02-22 VITALS — BP 156/87 | HR 63 | Temp 98.0°F | Ht 71.0 in | Wt 315.0 lb

## 2024-02-22 DIAGNOSIS — E119 Type 2 diabetes mellitus without complications: Secondary | ICD-10-CM | POA: Diagnosis not present

## 2024-02-22 DIAGNOSIS — Z7985 Long-term (current) use of injectable non-insulin antidiabetic drugs: Secondary | ICD-10-CM

## 2024-02-22 DIAGNOSIS — I10 Essential (primary) hypertension: Secondary | ICD-10-CM | POA: Diagnosis not present

## 2024-02-22 DIAGNOSIS — Z6841 Body Mass Index (BMI) 40.0 and over, adult: Secondary | ICD-10-CM

## 2024-02-22 DIAGNOSIS — N1832 Chronic kidney disease, stage 3b: Secondary | ICD-10-CM

## 2024-02-22 DIAGNOSIS — E66813 Obesity, class 3: Secondary | ICD-10-CM

## 2024-02-22 MED ORDER — MOUNJARO 12.5 MG/0.5ML ~~LOC~~ SOAJ: 12.5000 mg | SUBCUTANEOUS | 0 refills | Status: DC

## 2024-02-22 MED ORDER — ONDANSETRON HCL 4 MG PO TABS: 4.0000 mg | ORAL_TABLET | Freq: Three times a day (TID) | ORAL | 0 refills | Status: DC | PRN

## 2024-02-22 NOTE — Progress Notes (Signed)
 WEIGHT SUMMARY AND BIOMETRICS  Weight Lost Since Last Visit: 3lb  Weight Gained Since Last Visit: 0   Vitals Temp: 98 F (36.7 C) BP: (!) 156/87 Pulse Rate: 63 SpO2: 97 %   Anthropometric Measurements Height: 5' 11 (1.803 m) Weight: (!) 315 lb (142.9 kg) BMI (Calculated): 43.95 Weight at Last Visit: 318lb Weight Lost Since Last Visit: 3lb Weight Gained Since Last Visit: 0 Starting Weight: 326lb Total Weight Loss (lbs): 11 lb (4.99 kg)   No data recorded Other Clinical Data Fasting: no Labs: no Today's Visit #: 11 Starting Date: 05/13/23    OBESITY Raymond [name] is here to discuss his progress with his obesity treatment plan along with follow-up of his obesity related diagnoses.    Nutrition Plan: the Category 4 plan - 70% adherence.  Current exercise: chair exercises.  Interim History:  He is down another 3 lbs since his last visit. He went to the hospital in the interim.  Eating all of the food on the plan., Protein intake is as prescribed, Is not skipping meals, Not journaling consistently., Water intake is adequate., and Denies polyphagia   Pharmacotherapy: Raymond [name] is on Mounjaro  10 mg SQ weekly Adverse side effects: Nausea occasionally.  Hunger is moderately controlled.  Cravings are moderately controlled.  Assessment/Plan:   Type II Diabetes HgbA1c is at goal. Last A1c was 6.0 Episodes of hypoglycemia: no Medication(s): Mounjaro  12.5 mg SQ weekly  Lab Results  Component Value Date   HGBA1C 6.0 (H) 03/12/2023   HGBA1C 6.3 (H) 12/25/2022   Lab Results  Component Value Date   LDLCALC 77 05/13/2023   CREATININE 2.31 (H) 01/22/2024   Lab Results  Component Value Date   GFR 92.19 10/05/2014    Plan: Continue and refill Mounjaro  12.5 mg SQ weekly Continue all other medications.  Will keep all carbohydrates low both sweets and starches.   Will continue exercise regimen to 30 to 60 minutes on most days of the week.  Aim for 7 to 9 hours of sleep nightly.  Eat more low glycemic index foods.  He will monitor his water intake.  He will minimize any salt intake.   Hypertension Hypertension control uncertain.  Medication(s): Amlodipine  10 mg 1 daily  and Coreg  25 mg twice daily , Cardura  2 mg daily.   BP Readings from Last 3 Encounters:  02/22/24 (!) 156/87  01/22/24 (!) 152/87  01/14/24 116/73   Lab Results  Component Value Date   CREATININE 2.31 (H) 01/22/2024   CREATININE 2.82 (H) 01/21/2024   CREATININE 2.71 (H) 01/20/2024   Lab Results  Component Value Date   GFR 92.19 10/05/2014    Plan: Continue all antihypertensives at current dosages. No added salt. Will keep sodium content to 1,500 mg or less per day.      Morbid Obesity: Current BMI BMI (Calculated): 43.95   Pharmacotherapy Plan Continue and  refill  Mounjaro  12.5 mg SQ weekly and Rx for Zofran  4 mg every 8 hours as needed #30 with no refills.  Raymond [name] is currently in the action stage of change. As such, his goal is to continue with weight loss efforts.  He has agreed to the Category 4 plan.  Exercise goals: All adults should avoid inactivity. Some physical activity is better than none, and adults who participate in any amount of physical activity gain some health benefits.  Behavioral modification strategies: increasing lean protein intake, no meal skipping, meal planning , better snacking choices, planning for success, increasing vegetables, decrease snacking , avoiding temptations, weigh protein portions, and mindful eating.  Raymond [name] has agreed to follow-up with our clinic in 4 weeks.    Objective:   VITALS: Per patient if applicable, see vitals. GENERAL: Alert and in no acute distress. CARDIOPULMONARY: No increased WOB. Speaking in clear sentences.  PSYCH: Pleasant and cooperative. Speech normal rate and rhythm. Affect is appropriate. Insight  and judgement are appropriate. Attention is focused, linear, and appropriate.  NEURO: Oriented as arrived to appointment on time with no prompting.   Attestation Statements:   This was prepared with the assistance of Engineer, civil (consulting).  Occasional wrong-word or sound-a-like substitutions Raymond [name] have occurred due to the inherent limitations of voice recognition   Raymond Daring, DO

## 2024-02-25 ENCOUNTER — Encounter (HOSPITAL_BASED_OUTPATIENT_CLINIC_OR_DEPARTMENT_OTHER): Payer: PRIVATE HEALTH INSURANCE | Admitting: Registered Nurse

## 2024-02-25 ENCOUNTER — Encounter: Payer: Self-pay | Admitting: Registered Nurse

## 2024-02-25 ENCOUNTER — Ambulatory Visit: Payer: PRIVATE HEALTH INSURANCE | Admitting: Registered Nurse

## 2024-02-25 VITALS — BP 106/73 | HR 60 | Ht 71.0 in | Wt 315.0 lb

## 2024-02-25 DIAGNOSIS — Z993 Dependence on wheelchair: Secondary | ICD-10-CM | POA: Diagnosis present

## 2024-02-25 DIAGNOSIS — G8929 Other chronic pain: Secondary | ICD-10-CM

## 2024-02-25 DIAGNOSIS — M7061 Trochanteric bursitis, right hip: Secondary | ICD-10-CM | POA: Diagnosis present

## 2024-02-25 DIAGNOSIS — M25511 Pain in right shoulder: Secondary | ICD-10-CM

## 2024-02-25 DIAGNOSIS — M545 Low back pain, unspecified: Secondary | ICD-10-CM

## 2024-02-25 DIAGNOSIS — M5412 Radiculopathy, cervical region: Secondary | ICD-10-CM

## 2024-02-25 DIAGNOSIS — Z5181 Encounter for therapeutic drug level monitoring: Secondary | ICD-10-CM

## 2024-02-25 DIAGNOSIS — M7062 Trochanteric bursitis, left hip: Secondary | ICD-10-CM | POA: Diagnosis present

## 2024-02-25 DIAGNOSIS — M542 Cervicalgia: Secondary | ICD-10-CM

## 2024-02-25 DIAGNOSIS — Z79891 Long term (current) use of opiate analgesic: Secondary | ICD-10-CM

## 2024-02-25 DIAGNOSIS — G894 Chronic pain syndrome: Secondary | ICD-10-CM | POA: Diagnosis present

## 2024-02-25 DIAGNOSIS — M25512 Pain in left shoulder: Secondary | ICD-10-CM | POA: Diagnosis present

## 2024-02-25 DIAGNOSIS — M25562 Pain in left knee: Secondary | ICD-10-CM

## 2024-02-25 DIAGNOSIS — M25561 Pain in right knee: Secondary | ICD-10-CM

## 2024-02-25 MED ORDER — OXYCODONE-ACETAMINOPHEN 10-325 MG PO TABS: 1.0000 | ORAL_TABLET | Freq: Four times a day (QID) | ORAL | 0 refills | Status: DC | PRN

## 2024-02-25 NOTE — Progress Notes (Signed)
 Subjective:   Patient ID: Raymond [name], male    DOB: 16-Snethen-1967, 58 y.o.   MRN: [id number]  HPI: Raymond [name] is a 58 y.o. male who returns for follow up appointment for chronic pain and medication refill. He states his pain is located in his neck radiating into his bilateral shoulders, lower back, bilateral hips and bilateral knee pain. He rates his pain 5. His current exercise regime is water aerobics weekly.  [name] Morphine  equivalent is 60.00 MME. He  is also prescribed Alprazolam   by Dr. Marsa .We have discussed the black box warning of using opioids and benzodiazepines. I highlighted the dangers of using these drugs together and discussed the adverse events including respiratory suppression, overdose, cognitive impairment and importance of compliance with current regimen. We will continue to monitor and adjust as indicated.    Last Oral Swab was Performed on 11/30/2023, it was consistent.    Pain Inventory Average Pain 7-10 Pain Right Now 5 My pain is sharp, burning, dull, stabbing, tingling, and aching  In the last 24 hours, has pain interfered with the following? General activity 7 Relation with others 7 Enjoyment of life 7 What TIME of day is your pain at its worst? morning , daytime, evening, and night Sleep (in general) Poor  Pain is worse with: walking, bending, sitting, inactivity, standing, and some activites Pain improves with: rest, heat/ice, therapy/exercise, pacing activities, and medication Relief from Meds: 5  Family History  Problem Relation Age of Onset   Diabetes Mother    Hypertension Mother    High Cholesterol Mother    Heart disease Mother    Depression Mother    Diabetes Father    Hypertension Father    High Cholesterol Father    Heart disease Father    Cancer Other    Social History   Socioeconomic History   Marital status: Married    Spouse name: Not on file   Number of children: Not on file   Years of education: Not on file   Highest  education level: Not on file  Occupational History   Not on file  Tobacco Use   Smoking status: Former    Current packs/day: 2.00    Types: Cigarettes   Smokeless tobacco: Never  Vaping Use   Vaping status: Never Used  Substance and Sexual Activity   Alcohol use: Yes    Comment: rare   Drug use: No   Sexual activity: Not on file  Other Topics Concern   Not on file  Social History Narrative   Not on file   Social Drivers of Health   Financial Resource Strain: Not on file  Food Insecurity: No Food Insecurity (01/18/2024)   Hunger Vital Sign    Worried About Running Out of Food in the Last Year: Never true    Ran Out of Food in the Last Year: Never true  Transportation Needs: No Transportation Needs (01/18/2024)   PRAPARE - Administrator, Civil Service (Medical): No    Lack of Transportation (Non-Medical): No  Physical Activity: Not on file  Stress: No Stress Concern Present (03/21/2022)   Received from [facility] - Occupational Stress Questionnaire    Feeling of Stress : Not at all  Social Connections: Unknown (01/10/2022)   Received from [facility]   Social Network    Social Network: Not on file   Past Surgical History:  Procedure Laterality Date  CHONDROPLASTY Left 06/28/2014   Procedure: CHONDROPLASTY;  Surgeon: LELON JONETTA Shari Mickey., MD;  Location: [facility];  Service: Orthopedics;  Laterality: Left;   COLONOSCOPY     FOOT FASCIOTOMY     age 17-rt   KNEE ARTHROSCOPY WITH LATERAL MENISECTOMY Left 06/28/2014   Procedure: KNEE ARTHROSCOPY WITH LATERAL MENISECTOMY;  Surgeon: LELON JONETTA Shari Mickey., MD;  Location: [facility];  Service: Orthopedics;  Laterality: Left;   KNEE ARTHROSCOPY WITH MEDIAL MENISECTOMY Left 06/28/2014   Procedure: LEFT KNEE ARTHROSCOPY CHONDROPLASTY/WITH MEDIAL/LATERAL MENISECTOMIES;  Surgeon: LELON JONETTA Shari Mickey., MD;  Location: [facility];  Service:  Orthopedics;  Laterality: Left;   ORIF RADIUS & ULNA FRACTURES  2007   left   THORACIC DISCECTOMY N/A 03/12/2023   Procedure: THORACIC LAMINECTOMY AND  DISCECTOMY;  Surgeon: Gillie Duncans, MD;  Location: [facility] OR;  Service: Neurosurgery;  Laterality: N/A;   Past Surgical History:  Procedure Laterality Date   CHONDROPLASTY Left 06/28/2014   Procedure: CHONDROPLASTY;  Surgeon: LELON JONETTA Shari Mickey., MD;  Location: [facility];  Service: Orthopedics;  Laterality: Left;   COLONOSCOPY     FOOT FASCIOTOMY     age 17-rt   KNEE ARTHROSCOPY WITH LATERAL MENISECTOMY Left 06/28/2014   Procedure: KNEE ARTHROSCOPY WITH LATERAL MENISECTOMY;  Surgeon: LELON JONETTA Shari Mickey., MD;  Location: [facility];  Service: Orthopedics;  Laterality: Left;   KNEE ARTHROSCOPY WITH MEDIAL MENISECTOMY Left 06/28/2014   Procedure: LEFT KNEE ARTHROSCOPY CHONDROPLASTY/WITH MEDIAL/LATERAL MENISECTOMIES;  Surgeon: LELON JONETTA Shari Mickey., MD;  Location: [facility];  Service: Orthopedics;  Laterality: Left;   ORIF RADIUS & ULNA FRACTURES  2007   left   THORACIC DISCECTOMY N/A 03/12/2023   Procedure: THORACIC LAMINECTOMY AND  DISCECTOMY;  Surgeon: Gillie Duncans, MD;  Location: [facility] OR;  Service: Neurosurgery;  Laterality: N/A;   Past Medical History:  Diagnosis Date   Acute kidney injury superimposed on chronic kidney disease 04/13/2023   Acute on chronic anemia 04/13/2023   Anemia    Anxiety    Back pain    COPD (chronic obstructive pulmonary disease) (HCC)    Depression    Diabetes mellitus without complication (HCC)    Diverticulosis    with hx of LGIB   Edema    GERD (gastroesophageal reflux disease)    GIB (gastrointestinal bleeding)    Gout    Heavy smoker    Hyperlipidemia    Hypertension    Iron deficiency anemia    Sleep apnea    uses a cpap   Swelling of lower extremity    BP 106/73 (BP Location: Left Arm, Patient Position: Sitting, Cuff Size: Large)   Pulse (!) 59   Ht 5' 11  (1.803 m)   Wt (!) 315 lb (142.9 kg)   SpO2 94%   BMI 43.93 kg/m   Opioid Risk Score:   Fall Risk Score:  `1  Depression screen [facility] 2/9     01/11/2024   12:36 PM 11/30/2023    9:25 AM 10/19/2023   10:59 AM 09/25/2023   10:50 AM  Depression screen PHQ 2/9  Decreased Interest 1 0 0 0  Down, Depressed, Hopeless 1 0 0 0  PHQ - 2 Score 2 0 0 0  Altered sleeping   0   Tired, decreased energy   0   Change in appetite   0   Feeling bad or failure about yourself    0  Trouble concentrating   0   Moving slowly or fidgety/restless   0   Suicidal thoughts   0   PHQ-9 Score   0      Review of Systems  Musculoskeletal:  Positive for arthralgias, back pain and myalgias.       Neck pain, bilateral shoulder, hip and knee pain, low back pain  All other systems reviewed and are negative.      Objective:   Physical Exam Vitals and nursing note reviewed.  Constitutional:      Appearance: Normal appearance.  Cardiovascular:     Rate and Rhythm: Normal rate and regular rhythm.     Pulses: Normal pulses.     Heart sounds: Normal heart sounds.  Pulmonary:     Effort: Pulmonary effort is normal.     Breath sounds: Normal breath sounds.  Musculoskeletal:     Comments: Normal Muscle Bulk and Muscle Testing Reveals:  Upper Extremities:Full  ROM and Muscle Strength 5/5  Lumbar Paraspinal Tenderness: L-4-L-5 Lower Extremities: Decreased ROM and Muscle Strength 5/5 Bilateral Lower extremities Flexion Produces Pain into his Bilateral Patella's Arrived in wheelchair     Skin:    General: Skin is warm and dry.  Neurological:     Mental Status: He is alert and oriented to person, place, and time.  Psychiatric:        Mood and Affect: Mood normal.        Behavior: Behavior normal.          Assessment & Plan:  Cervicalgia: Continue HEP as Tolerated. Continue to monitor. 02/25/2024 Chronic Pain in Right Shoulder: Continue HEP as Tolerated. Continue to Monitor. 02/25/2024 Thoracic  Myelopathy: He reports taking his Baclofen  occasionally and Aquatic Therapy. Continue to Monitor. 02/25/2024 Chronic Bilateral Low Back Pain without Sciatica: Continue HEP as Tolerated. Continue to Monitor. 02/25/2024 Neuropathic Pain of Bilateral Lower Extremities: Continue current medication regimen. Continue to monitor. 02/25/2024 6. Chronic Pain Syndrome: Refilled: Oxycodone  10/325 mg one tablet 4 times a day as needed for pain #120. We will continue the opioid monitoring program, this consists of regular clinic visits, examinations, urine drug screen, pill counts as well as use of [place]  Controlled Substance Reporting system. A 12 month History has been reviewed on the [place]  Controlled Substance Reporting System on 02/25/2024   F/U with Dr. Lovorn

## 2024-03-09 ENCOUNTER — Ambulatory Visit: Payer: PRIVATE HEALTH INSURANCE | Attending: Physical Medicine and Rehabilitation | Admitting: Physical Therapy

## 2024-03-09 DIAGNOSIS — M25561 Pain in right knee: Secondary | ICD-10-CM | POA: Diagnosis present

## 2024-03-09 DIAGNOSIS — R2689 Other abnormalities of gait and mobility: Secondary | ICD-10-CM | POA: Diagnosis present

## 2024-03-09 DIAGNOSIS — M6281 Muscle weakness (generalized): Secondary | ICD-10-CM | POA: Diagnosis present

## 2024-03-09 DIAGNOSIS — R2681 Unsteadiness on feet: Secondary | ICD-10-CM | POA: Insufficient documentation

## 2024-03-09 DIAGNOSIS — M25562 Pain in left knee: Secondary | ICD-10-CM | POA: Diagnosis present

## 2024-03-09 DIAGNOSIS — G8929 Other chronic pain: Secondary | ICD-10-CM | POA: Insufficient documentation

## 2024-03-10 ENCOUNTER — Encounter: Payer: Self-pay | Admitting: Physical Therapy

## 2024-03-10 NOTE — Therapy (Signed)
 OUTPATIENT PHYSICAL THERAPY THORACIC/AQUATIC THERAPY NOTE/RE-CERT     Patient Name: Raymond [name] MRN: [id number] DOB:27-Jan-1966, 58 y.o., male Today's Date: 03/10/2024  END OF SESSION:  PT End of Session - 03/10/24 1338     Visit Number 28    Number of Visits 31    Date for Recertification  04/01/24    Authorization Type [employer] Managed    PT Start Time 1305    PT Stop Time 1400    PT Time Calculation (min) 55 min    Equipment Utilized During Treatment Other (comment)   single barbell, large noodle   Activity Tolerance Patient tolerated treatment well    Behavior During Therapy WFL for tasks assessed/performed            Past Medical History:  Diagnosis Date   Acute kidney injury superimposed on chronic kidney disease 04/13/2023   Acute on chronic anemia 04/13/2023   Anemia    Anxiety    Back pain    COPD (chronic obstructive pulmonary disease) (HCC)    Depression    Diabetes mellitus without complication (HCC)    Diverticulosis    with hx of LGIB   Edema    GERD (gastroesophageal reflux disease)    GIB (gastrointestinal bleeding)    Gout    Heavy smoker    Hyperlipidemia    Hypertension    Iron deficiency anemia    Sleep apnea    uses a cpap   Swelling of lower extremity    Past Surgical History:  Procedure Laterality Date   CHONDROPLASTY Left 06/28/2014   Procedure: CHONDROPLASTY;  Surgeon: LELON JONETTA Shari Mickey., MD;  Location: [facility];  Service: Orthopedics;  Laterality: Left;   COLONOSCOPY     FOOT FASCIOTOMY     age 6-rt   KNEE ARTHROSCOPY WITH LATERAL MENISECTOMY Left 06/28/2014   Procedure: KNEE ARTHROSCOPY WITH LATERAL MENISECTOMY;  Surgeon: LELON JONETTA Shari Mickey., MD;  Location: [facility];  Service: Orthopedics;  Laterality: Left;   KNEE ARTHROSCOPY WITH MEDIAL MENISECTOMY Left 06/28/2014   Procedure: LEFT KNEE ARTHROSCOPY CHONDROPLASTY/WITH MEDIAL/LATERAL MENISECTOMIES;  Surgeon: LELON JONETTA Shari Mickey., MD;  Location: [facility];  Service: Orthopedics;  Laterality: Left;   ORIF RADIUS & ULNA FRACTURES  2007   left   THORACIC DISCECTOMY N/A 03/12/2023   Procedure: THORACIC LAMINECTOMY AND  DISCECTOMY;  Surgeon: Gillie Duncans, MD;  Location: [facility] OR;  Service: Neurosurgery;  Laterality: N/A;   Patient Active Problem List   Diagnosis Date Noted   Hematuria 01/18/2024   Chronic incomplete paraplegia (HCC) 10/19/2023   Spasticity 10/19/2023   Wheelchair dependence 10/19/2023   Cystitis 05/21/2023   Acute diverticulitis 05/21/2023   Vitamin D  deficiency 05/14/2023   Low HDL (under 40) 05/14/2023   GERD (gastroesophageal reflux disease) 04/13/2023   Constipation 04/13/2023   Chronic back pain 04/13/2023   DVT, bilateral lower limbs (HCC) 04/13/2023   Coping style affecting medical condition 03/27/2023   Thoracic myelopathy with LE weakness 03/23/2023   HNP (herniated nucleus pulposus with myelopathy), thoracic 03/12/2023   Hyperlipidemia 12/24/2022   Failure to thrive  in adult 12/24/2022   Tobacco abuse 03/18/2018   BPH associated with nocturia 03/11/2017   History of substance abuse (HCC) 03/11/2017   Restless leg syndrome 03/11/2017   Restrictive lung disease 05/14/2016   OSA on CPAP 05/14/2016   Moderate COPD (chronic obstructive pulmonary disease) (HCC) 05/14/2016   Smoking greater than 40 pack years 05/14/2016   SOB (  shortness of breath) 10/06/2014   Edema of extremities 10/06/2014   Benign essential HTN 10/06/2014   Type 2 diabetes mellitus, without long-term current use of insulin  (HCC) 07/18/2011    PCP: [name], MD  REFERRING PROVIDER:  Lorilee Railing, MD   REFERRING DIAG: [phone] (ICD-10-CM) - Other spondylosis with myelopathy, thoracic region   THERAPY DIAG:  Other abnormalities of gait and mobility  Muscle weakness (generalized)  Unsteadiness on feet  Chronic pain of right knee  Chronic pain of left knee  Rationale for Evaluation and Treatment:  Rehabilitation  ONSET DATE: October 2024  SUBJECTIVE:                                                                                                                                                                                                         SUBJECTIVE STATEMENT:  Pt reports he hasn't done as well in PT this week as what he did last week; says his legs have swollen some this week - redness in bil. LE's noted   Hand dominance: Right  PERTINENT HISTORY:  Broken R ankle x2; history of COPD, T2DM, OSA, tobacco use, CKD 111a, morbid obesity who was admitted on 03/11/2023 with BLE weakness with decreased coordination and gait disorder as well as reports of urinary incontinence. He was found to have large HNP T10/T11 and underwent T10 laminectomy with discectomy by Dr. Gillie on 03/12/2023   PAIN:  Are you having pain? Yes: NPRS scale: 4-5/10 Pain location: chronic pain in knees Pain description: throbbing  Aggravating factors: weightbearing  Relieving factors: sitting   PRECAUTIONS: None  RED FLAGS: None     WEIGHT BEARING RESTRICTIONS: No  FALLS:  Has patient fallen in last 6 months? Yes. Number of falls 1 -- last fall Oct 2024  LIVING ENVIRONMENT: Lives with: lives with their spouse Lives in: House/apartment Stairs: ramp Has following equipment at home: Environmental consultant - 2 wheeled, Wheelchair (manual), and upright walker, hospital bed  OCCUPATION: Retired  PLOF: Able to do his own ADLs but easier to have wife assist him  PATIENT GOALS: Walk with the walker at least household distances  NEXT MD VISIT: 04/21/23 [place]  OBJECTIVE:  Note: Objective measures were completed at Evaluation unless otherwise noted.  DIAGNOSTIC FINDINGS:  03/12/23 MRI THORACIC SPINE IMPRESSION:   1. Moderate-sized lobulated left subarticular to foraminal disc protrusion at T10-11 with resultant severe spinal stenosis. Cord is compressed and deviated to the right at this level. Associated  cord signal changes concerning for edema and/or myelomalacia. 2. No other acute abnormality within the  thoracic spine. 3. Degenerative spondylosis at T8-9 through T11-12 without significant spinal stenosis. Associated moderate to severe bilateral foraminal narrowing at these levels as above.  MRI LUMBAR SPINE IMPRESSION:   1. No acute abnormality within the lumbar spine. 2. Multifactorial degenerative changes at L4-5 with resultant mild canal with severe left and moderate right lateral recess stenosis, with severe bilateral L4 foraminal narrowing. 3. Additional mild noncompressive disc bulging and facet hypertrophy elsewhere within the lumbar spine as above. No other significant stenosis or frank neural impingement.   Critical Value/emergent results were called by telephone at the time of interpretation on 03/12/2023 at 12:41 am to provider Dr. Griselda, who verbally acknowledged these results.  PATIENT SURVEYS:  FOTO 42; predicted 54; 06/15/23- 27; 07/15/23- 37   COGNITION: Overall cognitive status: Within functional limits for tasks assessed  SENSATION: WFL  POSTURE: rounded shoulders and forward head  PALPATION: No overt tenderness to palpation   CERVICAL ROM: WNL  LOWER EXTREMITY MMT:    MMT Right eval Left eval Right 07/15/23 Left 07/15/23 Right 08/17/23 Left  08/17/23  Hip flexion 3+ 3 4+ 3 4 4+  Hip extension 3+ 3+      Hip abduction 3+ 3+ 4 4 4 4   Hip adduction        Hip internal rotation        Hip external rotation        Knee flexion 4- 3+   3+ 3+  Knee extension 4- 3+ 4+ 4+ 3+ 4+  Ankle dorsiflexion   4+ 4+ 4+ 4  Ankle plantarflexion        Ankle inversion        Ankle eversion         (Blank rows = not tested)   FUNCTIONAL TESTS:  Transfers:  Chair to bed: Multiple seated scoots with UEs  Sit<>stand: max A -- limited due to knee pain Supine to sit<>sit: min A for trunk and LE negotiation. Not performing log rolling  TODAY'S TREATMENT:                                                                                                                               DATE:  03-09-24  Aquatic PT at [facility] - pool temp 90 degrees  Patient seen for aquatic therapy today.  Treatment took place in water 3.6-4.5 feet deep depending upon activity.  Pt entered and exited the pool via chair lift. Pt transferred from wheelchair to chair lift without use of sliding board with CGA to min assist;  transferred from chair lift to wheelchair with min assist, without use of sliding board  Pt performed runner's stretch in standing at pool wall - 20 sec hold x 1 rep each leg  Pt performed static standing at pool wall with cues for knee extension - bil. UE support on pool edge;  Gait training with use of pool noodle approx. 4 reps (18' x 8 reps) across pool  Sidestepping with noodle 18' x 4 reps; backwards amb. With noodle 18' x 4 reps with UE support on pool noodle  Pt performed static standing without touching wall or noodle for approx. 1 (for LTG assessment)  Marching in place - 10 reps x 2 sets each leg holding onto noodle  Squats x 10 reps - holding onto pool edge for assist with balance  Pt performed standing balance exercises - alternating hip flexion, extension, and abduction 10 reps each leg - with UE support on pool edge   Pt amb. Without UE support on pool noodle (positioned in front of him to have if needed) - 18' x 4 reps - close SBA for safety;  Pt requires buoyancy of water for support for reduced fall risk and for unloading/reduced stress on joints (B knees) as pt able to tolerate increased standing and ambulation in water compared to that on land; viscosity of water is needed for resistance for strengthening and current of water provides perturbations for challenge for balance training           PATIENT EDUCATION:  Education details: aquatic exercises Person educated: Patient Education method: Programmer, multimedia, Demonstration, and  Handouts Education comprehension: verbalized understanding, returned demonstration, and needs further education  HOME EXERCISE PROGRAM: Access Code: [email or web address] URL: [email or web address] Date: 04/21/2023 Prepared by: Gellen April Marie Nonato  Exercises - Staggered Bridge  - 1 x daily - 7 x weekly - 2 sets - 10 reps - Hooklying Isometric Clamshell  - 1 x daily - 7 x weekly - 2 sets - 10 reps - Small Range Straight Leg Raise  - 1 x daily - 7 x weekly - 2 sets - 10 reps  ASSESSMENT:  CLINICAL IMPRESSION:  Aquatic PT session focused on water walking/gait training, balance training and LE strengthening.  Pt has only been seen 2x in this certification period due to appt. conflicts and also due to aquatic PT's schedule.  Pt has met LTG's #2 & 3 with LTG #1 ongoing.  Pt ambulated in 4' water depth without use of floatation device for first time in today's session; pt amb. 18' x 4 reps with noodle placed in front of him to be able to hold if needed but knees did not buckle and pt was able to amb. Without UE support with very close SBA for safety.  Pt also able to stand without UE support for 1 in today's session, longest duration to date.  Pt is progressing well with aquatic exercises.  Plan to continue for 4 more sessions - pt requests continuation of aquatic PT.     OBJECTIVE IMPAIRMENTS: Abnormal gait, decreased balance, decreased endurance, decreased mobility, difficulty walking, decreased ROM, decreased strength, increased edema, improper body mechanics, postural dysfunction, obesity, and pain.   GOALS: Goals reviewed with patient? Yes        NEW Long term goals:  Target date 01-29-24= 4 weeks    1.  Independent in HEP for aquatic exercises to be continued after D/C from PT.    Baseline:    Goal status:  Ongoing 02-10-24    2.  Pt will amb. 1 lap across pool with noodle with min assist to be able to continue aquatic HEP with wife's assist.    Baseline:     Goal status:   Ongoing - water walker needed in session on 02-03-24   NEW Long term goals:  Target date 03-11-24= 4 weeks    1.  Independent in HEP for aquatic exercises to be continued  after D/C from PT.    Baseline:    Goal status:  Ongoing 03-09-24    2.  Pt will amb. 1 lap across pool with noodle with supervision to be able to continue aquatic HEP with wife's assist.    Baseline:     Goal status:  Goal met 03-09-24     3.  Pt will stand in 4.5' water depth without UE support for 1 to demo improved standing balance.     Baseline:  pt able to stand approx. 15 secs without UE support on 02-10-24    Goal status:   Goal met 03-09-24  NEW LONG TERM GOALS: Target date 04-15-24 = 4 weeks  1.  Pt will ambulate 5 laps (18' x 10 reps) in pool in 4' water depth with use of noodle prn for assist with balance with supervision only for pt to be able to independently             participate in aquatic exercise upon D/C from PT.  Baseline:  18' x 4 reps with CGA to SBA with noodle; 18' without UE support on noodle with SBA   Goal status;  NEW  2.  Pt will stand in 4.5' water depth without UE support for 3 to demo improved standing balance.   Baseline:  1 on 03-09-24  Goal status:  NEW  3.  Pt will transfer wheelchair to/from chair lift with SBA.           Baseline:  CGA on 03-09-24           Goal status:  NEW  4.   Independent in HEP for aquatic exercises to be continued after D/C from PT.  Baseline:    Goal status:  ONGOING   PLAN:   PT FREQUENCY: 1x/week  PT DURATION: 4 weeks (per renewal 02-10-24)  PLANNED INTERVENTIONS: 02835- PT Re-evaluation, 97110-Therapeutic exercises, 97530- Therapeutic activity, 97112- Neuromuscular re-education, 97535- Self Care, 02859- Manual therapy, [phone]- Gait training, [phone]- Orthotic Fit/training, [phone]- Aquatic Therapy, [phone]- Electrical stimulation (unattended), 97035- Ultrasound, 02966- Ionotophoresis 4mg /ml Dexamethasone , Patient/Family education, Balance training,  Stair training, Taping, Dry Needling, Joint mobilization, Spinal mobilization, Cryotherapy, and Moist heat  PLAN FOR NEXT SESSION: gait training, LE strengthening in the pool  [place], PT 03/10/24 1:42 PM

## 2024-03-13 ENCOUNTER — Other Ambulatory Visit: Payer: Self-pay | Admitting: Bariatrics

## 2024-03-16 ENCOUNTER — Ambulatory Visit: Payer: PRIVATE HEALTH INSURANCE | Admitting: Physical Therapy

## 2024-03-16 DIAGNOSIS — G8929 Other chronic pain: Secondary | ICD-10-CM

## 2024-03-16 DIAGNOSIS — R2689 Other abnormalities of gait and mobility: Secondary | ICD-10-CM

## 2024-03-16 DIAGNOSIS — M6281 Muscle weakness (generalized): Secondary | ICD-10-CM

## 2024-03-16 DIAGNOSIS — R2681 Unsteadiness on feet: Secondary | ICD-10-CM

## 2024-03-18 ENCOUNTER — Encounter: Payer: Self-pay | Admitting: Physical Therapy

## 2024-03-18 NOTE — Therapy (Signed)
 OUTPATIENT PHYSICAL THERAPY THORACIC/AQUATIC THERAPY NOTE     Patient Name: Raymond [name] MRN: [id number] DOB:[birth year], 58 y.o., male Today's Date: 03/18/2024  END OF SESSION:  PT End of Session - 03/18/24 1246     Visit Number 29    Number of Visits 31    Date for Recertification  04/29/24   per recert 03-09-24   Authorization Type [employer] Managed    PT Start Time 1310    PT Stop Time 1355    PT Time Calculation (min) 45 min    Equipment Utilized During Treatment Other (comment)   large noodle, large barbells   Activity Tolerance Patient tolerated treatment well    Behavior During Therapy WFL for tasks assessed/performed            Past Medical History:  Diagnosis Date   Acute kidney injury superimposed on chronic kidney disease 04/13/2023   Acute on chronic anemia 04/13/2023   Anemia    Anxiety    Back pain    COPD (chronic obstructive pulmonary disease) (HCC)    Depression    Diabetes mellitus without complication (HCC)    Diverticulosis    with hx of LGIB   Edema    GERD (gastroesophageal reflux disease)    GIB (gastrointestinal bleeding)    Gout    Heavy smoker    Hyperlipidemia    Hypertension    Iron deficiency anemia    Sleep apnea    uses a cpap   Swelling of lower extremity    Past Surgical History:  Procedure Laterality Date   CHONDROPLASTY Left 06/28/2014   Procedure: CHONDROPLASTY;  Surgeon: LELON JONETTA Shari Mickey., MD;  Location: [facility];  Service: Orthopedics;  Laterality: Left;   COLONOSCOPY     FOOT FASCIOTOMY     age 53-rt   KNEE ARTHROSCOPY WITH LATERAL MENISECTOMY Left 06/28/2014   Procedure: KNEE ARTHROSCOPY WITH LATERAL MENISECTOMY;  Surgeon: LELON JONETTA Shari Mickey., MD;  Location: [facility];  Service: Orthopedics;  Laterality: Left;   KNEE ARTHROSCOPY WITH MEDIAL MENISECTOMY Left 06/28/2014   Procedure: LEFT KNEE ARTHROSCOPY CHONDROPLASTY/WITH MEDIAL/LATERAL MENISECTOMIES;  Surgeon: LELON JONETTA Shari Mickey., MD;  Location: [facility];  Service: Orthopedics;  Laterality: Left;   ORIF RADIUS & ULNA FRACTURES  2007   left   THORACIC DISCECTOMY N/A 03/12/2023   Procedure: THORACIC LAMINECTOMY AND  DISCECTOMY;  Surgeon: Gillie Duncans, MD;  Location: [facility];  Service: Neurosurgery;  Laterality: N/A;   Patient Active Problem List   Diagnosis Date Noted   Hematuria 01/18/2024   Chronic incomplete paraplegia (HCC) 10/19/2023   Spasticity 10/19/2023   Wheelchair dependence 10/19/2023   Cystitis 05/21/2023   Acute diverticulitis 05/21/2023   Vitamin D  deficiency 05/14/2023   Low HDL (under 40) 05/14/2023   GERD (gastroesophageal reflux disease) 04/13/2023   Constipation 04/13/2023   Chronic back pain 04/13/2023   DVT, bilateral lower limbs (HCC) 04/13/2023   Coping style affecting medical condition 03/27/2023   Thoracic myelopathy with LE weakness 03/23/2023   HNP (herniated nucleus pulposus with myelopathy), thoracic 03/12/2023   Hyperlipidemia 12/24/2022   Failure to thrive  in adult 12/24/2022   Tobacco abuse 03/18/2018   BPH associated with nocturia 03/11/2017   History of substance abuse (HCC) 03/11/2017   Restless leg syndrome 03/11/2017   Restrictive lung disease 05/14/2016   OSA on CPAP 05/14/2016   Moderate COPD (chronic obstructive pulmonary disease) (HCC) 05/14/2016   Smoking greater than 40 pack years  05/14/2016   SOB (shortness of breath) 10/06/2014   Edema of extremities 10/06/2014   Benign essential HTN 10/06/2014   Type 2 diabetes mellitus, without long-term current use of insulin  (HCC) 07/18/2011    PCP: [name], MD  REFERRING PROVIDER:  Lorilee Railing, MD   REFERRING DIAG: [phone] (ICD-10-CM) - Other spondylosis with myelopathy, thoracic region   THERAPY DIAG:  Other abnormalities of gait and mobility  Muscle weakness (generalized)  Unsteadiness on feet  Chronic pain of right knee  Chronic pain of left knee  Rationale for Evaluation and Treatment:  Rehabilitation  ONSET DATE: October 2024  SUBJECTIVE:                                                                                                                                                                                                         SUBJECTIVE STATEMENT:  Pt reports he is doing fine - rode bike in PT for about 10 on Monday   Hand dominance: Right  PERTINENT HISTORY:  Broken R ankle x2; history of COPD, T2DM, OSA, tobacco use, CKD 111a, morbid obesity who was admitted on 03/11/2023 with BLE weakness with decreased coordination and gait disorder as well as reports of urinary incontinence. He was found to have large HNP T10/T11 and underwent T10 laminectomy with discectomy by Dr. Gillie on 03/12/2023   PAIN:  Are you having pain? Yes: NPRS scale: 4-5/10 Pain location: chronic pain in knees Pain description: throbbing  Aggravating factors: weightbearing  Relieving factors: sitting   PRECAUTIONS: None  RED FLAGS: None     WEIGHT BEARING RESTRICTIONS: No  FALLS:  Has patient fallen in last 6 months? Yes. Number of falls 1 -- last fall Oct 2024  LIVING ENVIRONMENT: Lives with: lives with their spouse Lives in: House/apartment Stairs: ramp Has following equipment at home: Environmental consultant - 2 wheeled, Wheelchair (manual), and upright walker, hospital bed  OCCUPATION: Retired  PLOF: Able to do his own ADLs but easier to have wife assist him  PATIENT GOALS: Walk with the walker at least household distances  NEXT MD VISIT: 04/21/23 [place]  OBJECTIVE:  Note: Objective measures were completed at Evaluation unless otherwise noted.  DIAGNOSTIC FINDINGS:  03/12/23 MRI THORACIC SPINE IMPRESSION:   1. Moderate-sized lobulated left subarticular to foraminal disc protrusion at T10-11 with resultant severe spinal stenosis. Cord is compressed and deviated to the right at this level. Associated cord signal changes concerning for edema and/or myelomalacia. 2. No other  acute abnormality within the thoracic spine. 3. Degenerative spondylosis at T8-9 through T11-12 without significant  spinal stenosis. Associated moderate to severe bilateral foraminal narrowing at these levels as above.  MRI LUMBAR SPINE IMPRESSION:   1. No acute abnormality within the lumbar spine. 2. Multifactorial degenerative changes at L4-5 with resultant mild canal with severe left and moderate right lateral recess stenosis, with severe bilateral L4 foraminal narrowing. 3. Additional mild noncompressive disc bulging and facet hypertrophy elsewhere within the lumbar spine as above. No other significant stenosis or frank neural impingement.   Critical Value/emergent results were called by telephone at the time of interpretation on 03/12/2023 at 12:41 am to provider Dr. Griselda, who verbally acknowledged these results.  PATIENT SURVEYS:  FOTO 42; predicted 54; 06/15/23- 27; 07/15/23- 37   COGNITION: Overall cognitive status: Within functional limits for tasks assessed  SENSATION: WFL  POSTURE: rounded shoulders and forward head  PALPATION: No overt tenderness to palpation   CERVICAL ROM: WNL  LOWER EXTREMITY MMT:    MMT Right eval Left eval Right 07/15/23 Left 07/15/23 Right 08/17/23 Left  08/17/23  Hip flexion 3+ 3 4+ 3 4 4+  Hip extension 3+ 3+      Hip abduction 3+ 3+ 4 4 4 4   Hip adduction        Hip internal rotation        Hip external rotation        Knee flexion 4- 3+   3+ 3+  Knee extension 4- 3+ 4+ 4+ 3+ 4+  Ankle dorsiflexion   4+ 4+ 4+ 4  Ankle plantarflexion        Ankle inversion        Ankle eversion         (Blank rows = not tested)   FUNCTIONAL TESTS:  Transfers:  Chair to bed: Multiple seated scoots with UEs  Sit<>stand: max A -- limited due to knee pain Supine to sit<>sit: min A for trunk and LE negotiation. Not performing log rolling  TODAY'S TREATMENT:                                                                                                                               DATE:  03-16-24  Aquatic PT at [facility] - pool temp 90 degrees  Patient seen for aquatic therapy today.  Treatment took place in water 3.6-4.5 feet deep depending upon activity.  Pt entered and exited the pool via chair lift. Pt transferred from wheelchair to chair lift without use of sliding board with CGA to min assist;  transferred from chair lift to wheelchair with CGA to min assist, without use of sliding board  Pt performed runner's stretch in standing at pool wall - 20 sec hold x 1 rep each leg  Pt performed static standing at pool wall with cues for knee extension - bil. UE support on pool edge;  Gait training with use of pool noodle approx. 6 reps (18' x 12 reps) across pool  Sidestepping with noodle 18' x 4 reps; backwards amb.  With noodle 18' x 4 reps with UE support on pool noodle   Marching in place - 10 reps x 2 sets each leg holding onto noodle  Squats x 10 reps - holding onto pool edge for assist with balance  Pt performed standing balance exercises - alternating hip flexion, extension, and abduction 10 reps each leg - with UE support on pool edge   Pt amb. Without UE support on pool noodle (positioned in front of him to have if needed) - 18' x 4 reps - close SBA for safety;  Pt requires buoyancy of water for support for reduced fall risk and for unloading/reduced stress on joints (B knees) as pt able to tolerate increased standing and ambulation in water compared to that on land; viscosity of water is needed for resistance for strengthening and current of water provides perturbations for challenge for balance training           PATIENT EDUCATION:  Education details: aquatic exercises Person educated: Patient Education method: Programmer, multimedia, Demonstration, and Handouts Education comprehension: verbalized understanding, returned demonstration, and needs further education  HOME EXERCISE PROGRAM: Access Code: [email or web address] URL:  [email or web address] Date: 04/21/2023 Prepared by: Gellen April Marie Nonato  Exercises - Staggered Bridge  - 1 x daily - 7 x weekly - 2 sets - 10 reps - Hooklying Isometric Clamshell  - 1 x daily - 7 x weekly - 2 sets - 10 reps - Small Range Straight Leg Raise  - 1 x daily - 7 x weekly - 2 sets - 10 reps  ASSESSMENT:  CLINICAL IMPRESSION:  Aquatic PT session focused on water walking/gait training, balance training and LE strengthening.  Pt able to transfer out of chair lift in pool to standing position with only SBA with pt holding onto edge of pool for assist with standing balance. Pt performed water walking with use of large noodle without assist of PT for majority of session of session, with pt able to recover LOB holding onto noodle or pool edge.  Pt improving with water walking and with standing balance exercises.  Cont with POC.    OBJECTIVE IMPAIRMENTS: Abnormal gait, decreased balance, decreased endurance, decreased mobility, difficulty walking, decreased ROM, decreased strength, increased edema, improper body mechanics, postural dysfunction, obesity, and pain.   GOALS: Goals reviewed with patient? Yes        NEW Long term goals:  Target date 01-29-24= 4 weeks    1.  Independent in HEP for aquatic exercises to be continued after D/C from PT.    Baseline:    Goal status:  Ongoing 02-10-24    2.  Pt will amb. 1 lap across pool with noodle with min assist to be able to continue aquatic HEP with wife's assist.    Baseline:     Goal status:  Ongoing - water walker needed in session on 02-03-24   NEW Long term goals:  Target date 03-11-24= 4 weeks    1.  Independent in HEP for aquatic exercises to be continued after D/C from PT.    Baseline:    Goal status:  Ongoing 03-09-24    2.  Pt will amb. 1 lap across pool with noodle with supervision to be able to continue aquatic HEP with wife's assist.    Baseline:     Goal status:  Goal met 03-09-24     3.  Pt  will stand in 4.5' water depth without UE support for 1 to demo improved standing balance.  Baseline:  pt able to stand approx. 15 secs without UE support on 02-10-24    Goal status:   Goal met 03-09-24  NEW LONG TERM GOALS: Target date 04-15-24 = 4 weeks  1.  Pt will ambulate 5 laps (18' x 10 reps) in pool in 4' water depth with use of noodle prn for assist with balance with supervision only for pt to be able to independently             participate in aquatic exercise upon D/C from PT.  Baseline:  18' x 4 reps with CGA to SBA with noodle; 18' without UE support on noodle with SBA   Goal status;  NEW  2.  Pt will stand in 4.5' water depth without UE support for 3 to demo improved standing balance.   Baseline:  1 on 03-09-24  Goal status:  NEW  3.  Pt will transfer wheelchair to/from chair lift with SBA.           Baseline:  CGA on 03-09-24           Goal status:  NEW  4.   Independent in HEP for aquatic exercises to be continued after D/C from PT.  Baseline:    Goal status:  ONGOING   PLAN:   PT FREQUENCY: 1x/week  PT DURATION: 4 weeks (per renewal 03-09-24)  PLANNED INTERVENTIONS: 02835- PT Re-evaluation, 97110-Therapeutic exercises, 97530- Therapeutic activity, 97112- Neuromuscular re-education, 97535- Self Care, 02859- Manual therapy, [phone]- Gait training, [phone]- Orthotic Fit/training, [phone]- Aquatic Therapy, [phone]- Electrical stimulation (unattended), [id number]- Ultrasound, 02966- Ionotophoresis 4mg /ml Dexamethasone , Patient/Family education, Balance training, Stair training, Taping, Dry Needling, Joint mobilization, Spinal mobilization, Cryotherapy, and Moist heat  PLAN FOR NEXT SESSION: gait training, LE strengthening in the pool  [place], PT 03/18/24 12:53 PM

## 2024-03-21 ENCOUNTER — Ambulatory Visit (INDEPENDENT_AMBULATORY_CARE_PROVIDER_SITE_OTHER): Payer: PRIVATE HEALTH INSURANCE | Admitting: Bariatrics

## 2024-03-21 ENCOUNTER — Encounter: Payer: Self-pay | Admitting: Bariatrics

## 2024-03-21 VITALS — BP 184/94 | HR 82 | Temp 97.8°F | Ht 71.0 in | Wt 309.0 lb

## 2024-03-21 DIAGNOSIS — Z7985 Long-term (current) use of injectable non-insulin antidiabetic drugs: Secondary | ICD-10-CM

## 2024-03-21 DIAGNOSIS — E119 Type 2 diabetes mellitus without complications: Secondary | ICD-10-CM

## 2024-03-21 DIAGNOSIS — Z6841 Body Mass Index (BMI) 40.0 and over, adult: Secondary | ICD-10-CM

## 2024-03-21 DIAGNOSIS — E1122 Type 2 diabetes mellitus with diabetic chronic kidney disease: Secondary | ICD-10-CM

## 2024-03-21 DIAGNOSIS — I10 Essential (primary) hypertension: Secondary | ICD-10-CM | POA: Diagnosis not present

## 2024-03-21 DIAGNOSIS — E66813 Obesity, class 3: Secondary | ICD-10-CM

## 2024-03-21 NOTE — Progress Notes (Signed)
 WEIGHT SUMMARY AND BIOMETRICS  Weight Lost Since Last Visit: 6lb  Weight Gained Since Last Visit: 0   Vitals Temp: 97.8 F (36.6 C) BP: (!) 184/94 Pulse Rate: 82 SpO2: 95 %   Anthropometric Measurements Height: 5' 11 (1.803 m) Weight: (!) 309 lb (140.2 kg) BMI (Calculated): 43.12 Weight at Last Visit: 315lb Weight Lost Since Last Visit: 6lb Weight Gained Since Last Visit: 0 Starting Weight: 326lb Total Weight Loss (lbs): 17 lb (7.711 kg)   No data recorded Other Clinical Data Fasting: no Labs: no Today's Visit #: 12 Starting Date: 05/13/23    OBESITY Lyric is here to discuss his progress with his obesity treatment plan along with follow-up of his obesity related diagnoses.    Nutrition Plan: the Category 4 plan - 70% adherence.  Current exercise: Leg lifts, walks in water, rides bike, for , 3 days a week.  Interim History:  He is down 6 lbs since his last visit.  Eating all of the food on the plan., Protein intake is as prescribed, Is not skipping meals, and Water intake is adequate.   Pharmacotherapy: Cleophas is on Mounjaro  12.5 mg SQ weekly Adverse side effects: None Hunger is moderately controlled.  Cravings are moderately controlled.  Assessment/Plan:   Type II Diabetes HgbA1c is at goal. Last A1c was 6.0 CBGs: only when sick or not feeling well.      Episodes of hypoglycemia: no Medication(s): Mounjaro  12.5 mg SQ weekly  Lab Results  Component Value Date   HGBA1C 6.0 (H) 03/12/2023   HGBA1C 6.3 (H) 12/25/2022   Lab Results  Component Value Date   LDLCALC 77 05/13/2023   CREATININE 2.31 (H) 01/22/2024   Lab Results  Component Value Date   GFR 92.19 10/05/2014    Plan: Continue Mounjaro  12.5 mg SQ weekly Continue all other medications.  Will keep all carbohydrates low both sweets and starches.  Will continue exercise  regimen to 30 to 60 minutes on most days of the week.  Aim for 7 to 9 hours of sleep nightly.  Eat more low glycemic index foods.   Hypertension Hypertension poorly controlled. He states that he has been out of one of his blood pressure medications and has called his PCP and that he is going to pick it up. He also had a stimulant drink (Red Bull) this am.  Medication(s): Continue medications.   BP Readings from Last 3 Encounters:  03/21/24 (!) 184/94  02/25/24 106/73  02/22/24 (!) 156/87   Lab Results  Component Value Date   CREATININE 2.31 (H) 01/22/2024   CREATININE 2.82 (H) 01/21/2024   CREATININE 2.71 (H) 01/20/2024   Lab Results  Component Value Date   GFR 92.19 10/05/2014    Plan: Continue all antihypertensives at current dosages. He will not drink any energy drinks.  Will start back on his blood pressure medications.  No added salt. Will keep sodium content to 1,500 mg or less per day.    Morbid Obesity: Current BMI BMI (Calculated): 43.12   Pharmacotherapy Plan Continue  Mounjaro  12.5 mg SQ weekly  Anuel is currently in the action stage of change. As such, his goal is to continue with weight loss efforts.  He has agreed to the Category 4 plan.  Exercise goals: All adults should avoid inactivity. Some physical activity is better than none, and adults who participate in any amount of physical activity gain some health benefits.  He is walking in water at the pool.   Behavioral modification strategies: increasing lean protein intake, decreasing simple carbohydrates , no meal skipping, meal planning , increase water intake, better snacking choices, planning for success, increasing vegetables, increasing fiber rich foods, emotional eating strategies, decrease snacking , and measure portion sizes.  Karina has agreed to follow-up with our clinic in 4 weeks.    Objective:   VITALS: Per patient if applicable, see vitals. GENERAL: Alert and in no acute  distress. CARDIOPULMONARY: No increased WOB. Speaking in clear sentences.  PSYCH: Pleasant and cooperative. Speech normal rate and rhythm. Affect is appropriate. Insight and judgement are appropriate. Attention is focused, linear, and appropriate.  NEURO: Oriented as arrived to appointment on time with no prompting.   Attestation Statements:   This was prepared with the assistance of Engineer, civil (consulting).  Occasional wrong-word or sound-a-like substitutions [place] have occurred due to the inherent limitations of voice recognition   Clayborne Daring, DO

## 2024-03-23 ENCOUNTER — Other Ambulatory Visit: Payer: Self-pay

## 2024-03-23 ENCOUNTER — Emergency Department (HOSPITAL_BASED_OUTPATIENT_CLINIC_OR_DEPARTMENT_OTHER)
Admission: EM | Admit: 2024-03-23 | Discharge: 2024-03-23 | Disposition: A | Discharge status: Home / Self Care | Payer: PRIVATE HEALTH INSURANCE | Attending: Emergency Medicine | Admitting: Emergency Medicine

## 2024-03-23 ENCOUNTER — Other Ambulatory Visit: Payer: Self-pay | Admitting: Bariatrics

## 2024-03-23 ENCOUNTER — Encounter (HOSPITAL_BASED_OUTPATIENT_CLINIC_OR_DEPARTMENT_OTHER): Payer: Self-pay

## 2024-03-23 ENCOUNTER — Emergency Department (HOSPITAL_BASED_OUTPATIENT_CLINIC_OR_DEPARTMENT_OTHER): Payer: PRIVATE HEALTH INSURANCE

## 2024-03-23 DIAGNOSIS — E119 Type 2 diabetes mellitus without complications: Secondary | ICD-10-CM | POA: Diagnosis not present

## 2024-03-23 DIAGNOSIS — K5900 Constipation, unspecified: Secondary | ICD-10-CM | POA: Diagnosis present

## 2024-03-23 DIAGNOSIS — I10 Essential (primary) hypertension: Secondary | ICD-10-CM | POA: Diagnosis not present

## 2024-03-23 DIAGNOSIS — J449 Chronic obstructive pulmonary disease, unspecified: Secondary | ICD-10-CM | POA: Insufficient documentation

## 2024-03-23 DIAGNOSIS — Z79899 Other long term (current) drug therapy: Secondary | ICD-10-CM | POA: Insufficient documentation

## 2024-03-23 DIAGNOSIS — R112 Nausea with vomiting, unspecified: Secondary | ICD-10-CM | POA: Diagnosis not present

## 2024-03-23 LAB — CBC
HCT: 36.2 % — ABNORMAL LOW (ref 39.0–52.0)
Hemoglobin: 11.8 g/dL — ABNORMAL LOW (ref 13.0–17.0)
MCH: 30 pg (ref 26.0–34.0)
MCHC: 32.6 g/dL (ref 30.0–36.0)
MCV: 92.1 fL (ref 80.0–100.0)
Platelets: 297 K/uL (ref 150–400)
RBC: 3.93 MIL/uL — ABNORMAL LOW (ref 4.22–5.81)
RDW: 14.8 % (ref 11.5–15.5)
WBC: 11.6 K/uL — ABNORMAL HIGH (ref 4.0–10.5)
nRBC: 0 % (ref 0.0–0.2)

## 2024-03-23 LAB — COMPREHENSIVE METABOLIC PANEL WITH GFR
ALT: 16 U/L (ref 0–44)
AST: 19 U/L (ref 15–41)
Albumin: 4.1 g/dL (ref 3.5–5.0)
Alkaline Phosphatase: 106 U/L (ref 38–126)
Anion gap: 12 (ref 5–15)
BUN: 25 mg/dL — ABNORMAL HIGH (ref 6–20)
CO2: 29 mmol/L (ref 22–32)
Calcium: 10.2 mg/dL (ref 8.9–10.3)
Chloride: 99 mmol/L (ref 98–111)
Creatinine, Ser: 2.02 mg/dL — ABNORMAL HIGH (ref 0.61–1.24)
GFR, Estimated: 37 mL/min — ABNORMAL LOW (ref >60)
Glucose, Bld: 128 mg/dL — ABNORMAL HIGH (ref 70–99)
Potassium: 3.6 mmol/L (ref 3.5–5.1)
Sodium: 140 mmol/L (ref 135–145)
Total Bilirubin: 0.3 mg/dL (ref 0.0–1.2)
Total Protein: 7.7 g/dL (ref 6.5–8.1)

## 2024-03-23 LAB — LIPASE, BLOOD: Lipase: 19 U/L (ref 11–51)

## 2024-03-23 MED ORDER — ONDANSETRON 4 MG PO TBDP
4.0000 mg | ORAL_TABLET | Freq: Three times a day (TID) | ORAL | 0 refills | Status: DC | PRN
Start: 2024-03-23 — End: 2024-04-18

## 2024-03-23 MED ORDER — FLEET ENEMA RE ENEM
1.0000 | ENEMA | Freq: Once | RECTAL | Status: AC
  Administered 2024-03-23: 1 via RECTAL
  Filled 2024-03-23: qty 1

## 2024-03-23 MED ORDER — ONDANSETRON HCL 4 MG/2ML IJ SOLN
4.0000 mg | Freq: Once | INTRAMUSCULAR | Status: AC
  Administered 2024-03-23: 4 mg via INTRAVENOUS
  Filled 2024-03-23: qty 2

## 2024-03-23 MED ORDER — LACTULOSE 20 GM/30ML PO SOLN: 30.0000 mL | Freq: Three times a day (TID) | ORAL | 1 refills | Status: AC

## 2024-03-23 MED ORDER — POLYETHYLENE GLYCOL 3350 17 G PO PACK: 17.0000 g | PACK | Freq: Two times a day (BID) | ORAL | 0 refills | Status: AC

## 2024-03-23 NOTE — Discharge Instructions (Signed)
 You were seen for your constipation and nausea and vomiting in the emergency department.  You had a CT scan that showed constipation without any bowel obstruction.  At home, please take the MiraLAX  twice a day and the lactulose  3 times a day until you start having regular bowel movements.  After that please just take the MiraLAX  once a day.    Check your MyChart online for the results of any tests that had not resulted by the time you left the emergency department.   Follow-up with your primary doctor in 2-3 days regarding your visit.    Return immediately to the emergency department if you experience any of the following: Worsening pain, vomiting, or any other concerning symptoms.    Thank you for visiting our Emergency Department. It was a pleasure taking care of you today.

## 2024-03-23 NOTE — ED Provider Notes (Signed)
 [name] Provider Note   CSN: [id number] Arrival date & time: 03/23/24  9182     Patient presents with: Constipation and Emesis   Raymond [name] is a 58 y.o. male.   58 year old male history of obesity on Mounjaro , OSA, diabetes, hypertension, COPD, diverticulosis, and chronic back pain on outpatient oxycodone  who presents emergency department with constipation nausea vomiting.  History obtained per patient and his wife.  Has been constipated for the past week.  Since yesterday has had 2 episodes of vomiting.  Still passing gas but his abdomen feels distended and is having mild pain.  Thinks Mounjaro  that he received over the weekend made it worse.  No abdominal surgeries.       Prior to Admission medications   Medication Sig Start Date End Date Taking? Authorizing Provider  Lactulose  20 GM/30ML SOLN Take 30 mLs (20 g total) by mouth in the morning, at noon, and at bedtime for 7 days. 03/23/24 03/30/24 Yes Yolande Lamar BROCKS, MD  ondansetron  (ZOFRAN -ODT) 4 MG disintegrating tablet Take 1 tablet (4 mg total) by mouth every 8 (eight) hours as needed for nausea or vomiting. 03/23/24  Yes Yolande Lamar BROCKS, MD  polyethylene glycol (MIRALAX ) 17 g packet Take 17 g by mouth 2 (two) times daily. 03/23/24  Yes Yolande Lamar BROCKS, MD  albuterol  (VENTOLIN  HFA) 108 [phone] Base) MCG/ACT inhaler Inhale 2 puffs into the lungs every 6 (six) hours as needed for wheezing or shortness of breath. 05/12/23   Raulkar, Sven SQUIBB, MD  allopurinol  (ZYLOPRIM ) 300 MG tablet Take 1 tablet (300 mg total) by mouth daily. 05/12/23   Raulkar, Sven SQUIBB, MD  ALPRAZolam  (XANAX ) 0.5 MG tablet Take 1 tablet (0.5 mg total) by mouth 2 (two) times daily as needed. 05/12/23   Raulkar, Sven SQUIBB, MD  amLODipine  (NORVASC ) 10 MG tablet Take 10 mg by mouth daily. 07/20/23   [provider]  atorvastatin  (LIPITOR) 40 MG tablet Take 1 tablet (40 mg total) by mouth every evening.  05/12/23   Raulkar, Sven SQUIBB, MD  baclofen  (LIORESAL ) 10 MG tablet Take 1.5 tablets (15 mg total) by mouth 4 (four) times daily. X 1 week then increase to 20 mg QID- for spasticity with Tizanidine  10/19/23   Lovorn, Megan, MD  carvedilol  (COREG ) 25 MG tablet Take 1 tablet (25 mg total) by mouth in the morning and at bedtime. 05/12/23   Raulkar, Sven SQUIBB, MD  diclofenac  Sodium (VOLTAREN ) 1 % GEL Apply 4 g topically 4 (four) times daily. To bilateral knees 05/12/23   Raulkar, Sven SQUIBB, MD  doxazosin  (CARDURA ) 2 MG tablet Take 1 tablet (2 mg total) by mouth at bedtime. Patient taking differently: Take 2 mg by mouth 2 (two) times daily. 05/12/23   Raulkar, Sven SQUIBB, MD  DULoxetine  (CYMBALTA ) 30 MG capsule Take 1 capsule by mouth once daily Patient taking differently: Take 60 mg by mouth daily. 06/24/23   Raulkar, Sven SQUIBB, MD  furosemide  (LASIX ) 40 MG tablet Take 4 tablets (160 mg total) by mouth daily as needed for edema or fluid. 01/22/24   Arlice Reichert, MD  hydrALAZINE  (APRESOLINE ) 25 MG tablet Take 25 mg by mouth 3 (three) times daily.    [provider]  lidocaine  (LIDODERM ) 5 % Place 2 patches onto the skin daily at 10 pm. Remove & Discard patch within 12 hours or as directed by MD 05/12/23   Lorilee, Sven SQUIBB, MD  omeprazole (PRILOSEC) 40 MG capsule Take 40  mg by mouth daily. 07/02/23   [provider]  ondansetron  (ZOFRAN ) 4 MG tablet Take 1 tablet (4 mg total) by mouth every 8 (eight) hours as needed for nausea or vomiting. 02/22/24   Delores Shields A, DO  oxyCODONE -acetaminophen  (PERCOCET) 10-325 MG tablet Take 1 tablet by mouth 4 (four) times daily as needed for pain. 02/25/24   Debby Fidela CROME, NP  pramipexole  (MIRAPEX ) 0.5 MG tablet Take 1 tablet (0.5 mg total) by mouth in the morning and at bedtime. 05/12/23   Raulkar, Sven SQUIBB, MD  tamsulosin  (FLOMAX ) 0.4 MG CAPS capsule Take 1 capsule (0.4 mg total) by mouth at bedtime. 04/09/23   Love, Sharlet RAMAN, PA-C  tirzepatide   (MOUNJARO ) 12.5 MG/0.5ML Pen Inject 12.5 mg into the skin once a week. 02/22/24   Delores Shields A, DO  tiZANidine  (ZANAFLEX ) 4 MG tablet Start with 4 mg at bedtime x 4 days, then 4 mg BID x 4 days, then increase to 4 mg in AM and 8 mg at bedtime x 4 days, then 4 mg in AM, 4 mg in Afternoon and 8 mg at bedtime- for spasticity with baclofen  10/19/23   Lovorn, Megan, MD  topiramate  (TOPAMAX ) 25 MG tablet Take 1 tablet (25 mg total) by mouth 2 (two) times daily. 05/12/23   Raulkar, Sven SQUIBB, MD  Vitamin D , Ergocalciferol , (DRISDOL ) 1.25 MG (50000 UNIT) CAPS capsule Take 1 capsule (50,000 Units total) by mouth every 14 (fourteen) days. 01/14/24   Delores Shields LABOR, DO    Allergies: Haldol  [haloperidol ], Morphine , and Sulfa  antibiotics    Review of Systems  Updated Vital Signs BP 114/65   Pulse 71   Temp 97.7 F (36.5 C) (Oral)   Resp 18   SpO2 93%   Physical Exam Constitutional:      Appearance: Normal appearance. He is obese.  Abdominal:     General: There is distension.     Palpations: There is no mass.     Tenderness: There is abdominal tenderness (Minimal diffuse). There is no guarding.  Neurological:     Mental Status: He is alert.     (all labs ordered are listed, but only abnormal results are displayed) Labs Reviewed  COMPREHENSIVE METABOLIC PANEL WITH GFR - Abnormal; Notable for the following components:      Result Value   Glucose, Bld 128 (*)    BUN 25 (*)    Creatinine, Ser 2.02 (*)    GFR, Estimated 37 (*)    All other components within normal limits  CBC - Abnormal; Notable for the following components:   WBC 11.6 (*)    RBC 3.93 (*)    Hemoglobin 11.8 (*)    HCT 36.2 (*)    All other components within normal limits  LIPASE, BLOOD    EKG: None  Radiology: CT ABDOMEN PELVIS WO CONTRAST Result Date: 03/23/2024 EXAM: CT ABDOMEN AND PELVIS WITHOUT CONTRAST 03/23/2024 09:44:00 AM TECHNIQUE: CT of the abdomen and pelvis was performed without the administration of  intravenous contrast. Multiplanar reformatted images are provided for review. Automated exposure control, iterative reconstruction, and/or weight-based adjustment of the mA/kV was utilized to reduce the radiation dose to as low as reasonably achievable. COMPARISON: CT abdomen and pelvis 01/18/2024 and earlier. CLINICAL HISTORY: 58 year old male. Bowel obstruction suspected. 6 days of constipation. FINDINGS: LOWER CHEST: Negative. LIVER: Stable and negative noncontrast liver. GALLBLADDER AND BILE DUCTS: Gallbladder is unremarkable. No biliary ductal dilatation. SPLEEN: Stable spleen, no splenomegaly. PANCREAS: No acute abnormality. ADRENAL  GLANDS: No acute abnormality. KIDNEYS, URETERS AND BLADDER: Stable punctate left nephrolithiasis. Small chronic benign appearing renal cysts (no follow up imaging recommended). No stones in the ureters. No hydronephrosis. No perinephric or periureteral stranding. Urinary bladder is unremarkable. GI AND BOWEL: Progressed large bowel retained stool moderate volume throughout the abdomen and pelvis now and underlying large bowel redundancy. Normal gas containing appendix on series 301 image 67. Chronic diverticulosis of the sigmoid colon with no active inflammation. No dilated small bowel. Negative noncontrast stomach. PERITONEUM AND RETROPERITONEUM: No pneumoperitoneum or free fluid. VASCULATURE: Chronic aortomalacia and calcified atherosclerosis with normal caliber abdominal aorta. LYMPH NODES: Chronic inguinal lymph nodes individually up to 13 mm short axis are stable from last year and most likely benign. No lymphadenopathy in the abdomen or pelvis. REPRODUCTIVE ORGANS: No acute abnormality. 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 Vacuum disc there has regressed. The abnormal alignment is stable. Intermittent advanced spinal disc and endplate  degeneration elsewhere. No new osseous abnormality. No focal soft tissue abnormality. IMPRESSION: 1. Increased stool burden since August with underlying large bowel redundancy, suggesting constipation in this setting. 2. No superimposed acute or inflammatory process identified in the non-contrast abdomen or pelvis. Electronically signed by: Helayne Hurst MD 03/23/2024 10:15 AM EDT RP Workstation: [id number]     Procedures   Medications Ordered in the ED  ondansetron  (ZOFRAN ) injection 4 mg (4 mg Intravenous Given 03/23/24 0941)  sodium phosphate  (FLEET) enema 1 enema (1 enema Rectal Given 03/23/24 1014)    Clinical Course as of 03/23/24 1050  Wed Mar 23, 2024  1001 Creatinine(!): 2.02 At baseline [RP]    Clinical Course User Index [RP] Yolande Lamar BROCKS, MD                                 Medical Decision Making Amount and/or Complexity of Data Reviewed Labs: ordered. Decision-making details documented in ED Course. Radiology: ordered.  Risk OTC drugs. Prescription drug management.   58 year old male history of obesity on Mounjaro , OSA, diabetes, hypertension, COPD, diverticulosis, and chronic back pain on outpatient oxycodone  who presents emergency department with constipation nausea vomiting.    Initial Ddx:  Constipation, ileus, bowel obstruction, medication side effect stercoral colitis  MDM/Course:  10:50 AM Patient presents emergency department with constipation and nausea and vomiting.  Still passing gas but is reporting some abdominal distention.  With the vomiting and tenderness to palpation will we will go ahead and obtain a CT scan at this [name] in time to evaluate for bowel obstruction though I suspect that this is likely severe constipation.  Could also have stercoral colitis which CT scan will be helpful to evaluate for as well as ileus.  Will obtain lab work at this [name] in time as well  10:50 AM CT scan without acute findings or signs of obstruction.  Patient  was given an enema and upon re-evaluation was habitable to have a small bowel movement.  Will start him on a bowel regimen at this time for his constipation.  Will initiate lactulose  and MiraLAX  until his bowel habits become normal and then we will do MiraLAX  daily. Will him follow-up with his primary doctor in several days as well.  This patient presents to the ED for concern of complaints listed in HPI, this involves an extensive number of treatment options, and is  a complaint that carries with it a high risk of complications and morbidity. Disposition including potential need for admission considered.   Dispo: DC Home. Return precautions discussed including, but not limited to, those listed in the AVS. Allowed pt time to ask questions which were answered fully prior to dc.  Additional history obtained from spouse Records reviewed ED Visit Notes The following labs were independently interpreted: Chemistry and show CKD I independently reviewed the following imaging with scope of interpretation limited to determining acute life threatening conditions related to emergency care: CT Abdomen/Pelvis and agree with the radiologist interpretation with the following exceptions: none I have reviewed the patients home medications and made adjustments as needed Social Determinants of health:  Geriatric  Portions of this note were generated with Scientist, clinical (histocompatibility and immunogenetics). Dictation errors [place] occur despite best attempts at proofreading.     Final diagnoses:  Constipation, unspecified constipation type  Nausea and vomiting, unspecified vomiting type    ED Discharge Orders          Ordered    ondansetron  (ZOFRAN -ODT) 4 MG disintegrating tablet  Every 8 hours PRN        03/23/24 1050    polyethylene glycol (MIRALAX ) 17 g packet  2 times daily        03/23/24 1050    Lactulose  20 GM/30ML SOLN  3 times daily        03/23/24 1050               Yolande Lamar BROCKS, MD 03/23/24 1050

## 2024-03-23 NOTE — ED Triage Notes (Signed)
 Constipation for 6 days. States he thinks it is from narcotic pain meds.  Also reports N/V for 1 week

## 2024-03-30 ENCOUNTER — Emergency Department (HOSPITAL_COMMUNITY): Payer: PRIVATE HEALTH INSURANCE

## 2024-03-30 ENCOUNTER — Encounter (HOSPITAL_COMMUNITY): Payer: Self-pay | Admitting: Emergency Medicine

## 2024-03-30 ENCOUNTER — Other Ambulatory Visit: Payer: Self-pay

## 2024-03-30 ENCOUNTER — Ambulatory Visit: Admitting: Physical Therapy

## 2024-03-30 ENCOUNTER — Emergency Department (HOSPITAL_COMMUNITY)
Admission: EM | Admit: 2024-03-30 | Discharge: 2024-03-30 | Disposition: A | Discharge status: Home / Self Care | Payer: PRIVATE HEALTH INSURANCE | Attending: Emergency Medicine | Admitting: Emergency Medicine

## 2024-03-30 DIAGNOSIS — Z79899 Other long term (current) drug therapy: Secondary | ICD-10-CM | POA: Diagnosis not present

## 2024-03-30 DIAGNOSIS — G8929 Other chronic pain: Secondary | ICD-10-CM | POA: Insufficient documentation

## 2024-03-30 DIAGNOSIS — R1084 Generalized abdominal pain: Secondary | ICD-10-CM | POA: Diagnosis present

## 2024-03-30 DIAGNOSIS — R410 Disorientation, unspecified: Secondary | ICD-10-CM | POA: Insufficient documentation

## 2024-03-30 DIAGNOSIS — M7989 Other specified soft tissue disorders: Secondary | ICD-10-CM | POA: Insufficient documentation

## 2024-03-30 DIAGNOSIS — K5900 Constipation, unspecified: Secondary | ICD-10-CM | POA: Insufficient documentation

## 2024-03-30 DIAGNOSIS — J449 Chronic obstructive pulmonary disease, unspecified: Secondary | ICD-10-CM | POA: Diagnosis not present

## 2024-03-30 DIAGNOSIS — R112 Nausea with vomiting, unspecified: Secondary | ICD-10-CM | POA: Diagnosis not present

## 2024-03-30 DIAGNOSIS — N189 Chronic kidney disease, unspecified: Secondary | ICD-10-CM | POA: Insufficient documentation

## 2024-03-30 DIAGNOSIS — R0602 Shortness of breath: Secondary | ICD-10-CM | POA: Diagnosis not present

## 2024-03-30 DIAGNOSIS — Z9189 Other specified personal risk factors, not elsewhere classified: Secondary | ICD-10-CM

## 2024-03-30 DIAGNOSIS — M545 Low back pain, unspecified: Secondary | ICD-10-CM | POA: Diagnosis not present

## 2024-03-30 DIAGNOSIS — I129 Hypertensive chronic kidney disease with stage 1 through stage 4 chronic kidney disease, or unspecified chronic kidney disease: Secondary | ICD-10-CM | POA: Diagnosis not present

## 2024-03-30 LAB — URINALYSIS, W/ REFLEX TO CULTURE (INFECTION SUSPECTED)
Bilirubin Urine: NEGATIVE
Glucose, UA: NEGATIVE mg/dL
Hgb urine dipstick: NEGATIVE
Ketones, ur: NEGATIVE mg/dL
Leukocytes,Ua: NEGATIVE
Nitrite: NEGATIVE
Protein, ur: 100 mg/dL — AB
Specific Gravity, Urine: 1.01 (ref 1.005–1.030)
pH: 6 (ref 5.0–8.0)

## 2024-03-30 LAB — CBC WITH DIFFERENTIAL/PLATELET
Abs Immature Granulocytes: 0.06 K/uL (ref 0.00–0.07)
Basophils Absolute: 0.1 K/uL (ref 0.0–0.1)
Basophils Relative: 1 %
Eosinophils Absolute: 0.3 K/uL (ref 0.0–0.5)
Eosinophils Relative: 2 %
HCT: 41.9 % (ref 39.0–52.0)
Hemoglobin: 13.4 g/dL (ref 13.0–17.0)
Immature Granulocytes: 0 %
Lymphocytes Relative: 15 %
Lymphs Abs: 2 K/uL (ref 0.7–4.0)
MCH: 29.5 pg (ref 26.0–34.0)
MCHC: 32 g/dL (ref 30.0–36.0)
MCV: 92.1 fL (ref 80.0–100.0)
Monocytes Absolute: 0.6 K/uL (ref 0.1–1.0)
Monocytes Relative: 4 %
Neutro Abs: 10.4 K/uL — ABNORMAL HIGH (ref 1.7–7.7)
Neutrophils Relative %: 78 %
Platelets: 341 K/uL (ref 150–400)
RBC: 4.55 MIL/uL (ref 4.22–5.81)
RDW: 15.1 % (ref 11.5–15.5)
WBC: 13.4 K/uL — ABNORMAL HIGH (ref 4.0–10.5)
nRBC: 0 % (ref 0.0–0.2)

## 2024-03-30 LAB — COMPREHENSIVE METABOLIC PANEL WITH GFR
ALT: 17 U/L (ref 0–44)
AST: 21 U/L (ref 15–41)
Albumin: 3.8 g/dL (ref 3.5–5.0)
Alkaline Phosphatase: 91 U/L (ref 38–126)
Anion gap: 20 — ABNORMAL HIGH (ref 5–15)
BUN: 19 mg/dL (ref 6–20)
CO2: 27 mmol/L (ref 22–32)
Calcium: 10 mg/dL (ref 8.9–10.3)
Chloride: 96 mmol/L — ABNORMAL LOW (ref 98–111)
Creatinine, Ser: 1.74 mg/dL — ABNORMAL HIGH (ref 0.61–1.24)
GFR, Estimated: 44 mL/min — ABNORMAL LOW (ref >60)
Glucose, Bld: 154 mg/dL — ABNORMAL HIGH (ref 70–99)
Potassium: 3.8 mmol/L (ref 3.5–5.1)
Sodium: 143 mmol/L (ref 135–145)
Total Bilirubin: 0.5 mg/dL (ref 0.0–1.2)
Total Protein: 8.3 g/dL — ABNORMAL HIGH (ref 6.5–8.1)

## 2024-03-30 LAB — RESP PANEL BY RT-PCR (RSV, FLU A&B, COVID)  RVPGX2
Influenza A by PCR: NEGATIVE
Influenza B by PCR: NEGATIVE
Resp Syncytial Virus by PCR: NEGATIVE
SARS Coronavirus 2 by RT PCR: NEGATIVE

## 2024-03-30 LAB — TROPONIN I (HIGH SENSITIVITY)
Troponin I (High Sensitivity): 9 ng/L (ref <18)
Troponin I (High Sensitivity): 10 ng/L (ref <18)

## 2024-03-30 LAB — BRAIN NATRIURETIC PEPTIDE: B Natriuretic Peptide: 89.4 pg/mL (ref 0.0–100.0)

## 2024-03-30 MED ORDER — FUROSEMIDE 10 MG/ML IJ SOLN
40.0000 mg | Freq: Once | INTRAMUSCULAR | Status: AC
  Administered 2024-03-30: 40 mg via INTRAVENOUS
  Filled 2024-03-30: qty 4

## 2024-03-30 MED ORDER — HYDROMORPHONE HCL 1 MG/ML IJ SOLN
0.5000 mg | Freq: Once | INTRAMUSCULAR | Status: AC
  Administered 2024-03-30: 0.5 mg via INTRAVENOUS
  Filled 2024-03-30: qty 1

## 2024-03-30 MED ORDER — IOHEXOL 350 MG/ML SOLN
75.0000 mL | Freq: Once | INTRAVENOUS | Status: AC | PRN
Start: 2024-03-30 — End: 2024-03-30
  Administered 2024-03-30: 75 mL via INTRAVENOUS

## 2024-03-30 NOTE — ED Provider Triage Note (Signed)
 Emergency Medicine Provider Triage Evaluation Note  Raymond [name] , a 58 y.o. male  was evaluated in triage.  Pt complains of multiple.  Review of Systems  Positive: AMS, vomiting, SOB, LE edema Negative: Fever, falls, facial droop, aphasia  Physical Exam  BP (!) 140/97 (BP Location: Left Arm)   Pulse (!) 104   Temp 97.8 F (36.6 C) (Oral)   Resp 19   SpO2 95%  Gen:   Awake, no distress   Resp:  Normal effort  MSK:   Moves extremities without difficulty  Other:    Medical Decision Making  Medically screening exam initiated at 1:56 PM.  Appropriate orders placed.  Alvis Edgell Hodsdon was informed that the remainder of the evaluation will be completed by another provider, this initial triage assessment does not replace that evaluation, and the importance of remaining in the ED until their evaluation is complete.  Patient here with wife. For the past 2 days, he isn't sleeping, is confused (can't work his phone, doesn't know how to turn on the TV, talks nonstop which is not usual, talks but makes little sense). Has had x4 per day vomiting. No fever, no chest pain. Stools are dark but is taking pepto bismal.    Odell Balls, PA-C 03/30/24 1359

## 2024-03-30 NOTE — Discharge Instructions (Addendum)
 As we discussed, your workup in the ER today was reassuring for acute findings.  Laboratory evaluation and imaging did not reveal any emergent cause for symptoms.  Your CT scan does look like you are constipated.  I recommend that you continue with the regimen that you were prescribed a few days ago in the emergency department. You can add ex-lax chocolate can get over-the-counter at your local pharmacy to this regimen if you are not having sufficient output.  Additionally, given your legs are swollen, I have given you a dose of IV Lasix  today to help with this.  For the next 2 days, double your lasix  dose.  Take 40 mg twice a day.  Please make sure you are keeping your legs elevated and wearing compression stockings as well.  Additionally, I suspect that your confusion this morning is due to the medicines you are taking.  I recommend limiting the amount of Xanax , tizanidine , and oxycodone  that you are taking at this time to reduce these adverse effects.  Call your PCP to schedule close follow-up appointment for long-term management of the symptoms.  Return if development of any new or worsening symptoms.

## 2024-03-30 NOTE — ED Triage Notes (Signed)
 PT arrives via POV. Pt reports cough, nausea, vomiting, and sob since last night. Pt also reports constipation. Pt arrives AxOx4.

## 2024-03-30 NOTE — ED Provider Notes (Signed)
 [place] [facility] AT [facility] Provider Note   CSN: [id number] Arrival date & time: 03/30/24  1341     Patient presents with: Shortness of Breath, Vomiting, Cough, and Constipation   Raymond [name] is a 58 y.o. male.   Patient with history of CKD, chronic back pain, COPD, diabetes, hypertension, hyperlipidemia presents today with multiple complaints.   First, family notes that the patient was more confused this morning, speaking nonsensically, unable to do simple tasks such as turn the TV on. Family thinks he Muns have taken too much of his medications, Xanax , Zanaflex , and oxycodone . Reports he takes this medications for his chronic back pain.  Reports his back has been bothering him more recently.  Denies any new trauma. No loss of bowel or bladder function or saddle anesthesia.  He does not walk at baseline. Family reports several hours ago this confusion resolved and he is now back to his baseline.   Patient also reports that he has been more short of breath recently. Reports both of his legs have been more swollen as well. Reports he is on Lasix  for edema and has been taking this. Reports he has had a more productive cough as well. Does note that he has a history of DVT, was on Eliquis  but was taken off when he had a GI bleed in August. Patient reports that he does not walk due to history of transverse myelitis, is wheelchair bound, reports that he goes to physical therapy once a week and occupational therapy twice a week.  Additionally, patient reports that he has been having progressive abdominal pain with nausea and vomiting. Reports he was here for these symptoms a few days ago and was told he was constipated, he has been taking the medication that he was prescribed and reports he has had a few bowel movements but is still having pain with nausea and vomiting.  The history is provided by the patient. No language interpreter was used.  Shortness of  Breath Associated symptoms: abdominal pain, cough and vomiting   Cough Associated symptoms: shortness of breath   Constipation Associated symptoms: abdominal pain, nausea and vomiting        Prior to Admission medications   Medication Sig Start Date End Date Taking? Authorizing Provider  albuterol  (VENTOLIN  HFA) 108 (90 Base) MCG/ACT inhaler Inhale 2 puffs into the lungs every 6 (six) hours as needed for wheezing or shortness of breath. 05/12/23   Raulkar, Sven SQUIBB, MD  allopurinol  (ZYLOPRIM ) 300 MG tablet Take 1 tablet (300 mg total) by mouth daily. 05/12/23   Raulkar, Sven SQUIBB, MD  ALPRAZolam  (XANAX ) 0.5 MG tablet Take 1 tablet (0.5 mg total) by mouth 2 (two) times daily as needed. 05/12/23   Raulkar, Sven SQUIBB, MD  amLODipine  (NORVASC ) 10 MG tablet Take 10 mg by mouth daily. 07/20/23   [provider]  atorvastatin  (LIPITOR) 40 MG tablet Take 1 tablet (40 mg total) by mouth every evening. 05/12/23   Raulkar, Sven SQUIBB, MD  baclofen  (LIORESAL ) 10 MG tablet Take 1.5 tablets (15 mg total) by mouth 4 (four) times daily. X 1 week then increase to 20 mg QID- for spasticity with Tizanidine  10/19/23   Lovorn, Megan, MD  carvedilol  (COREG ) 25 MG tablet Take 1 tablet (25 mg total) by mouth in the morning and at bedtime. 05/12/23   Raulkar, Sven SQUIBB, MD  diclofenac  Sodium (VOLTAREN ) 1 % GEL Apply 4 g topically 4 (four) times daily. To bilateral knees 05/12/23  Lorilee Sven SQUIBB, MD  doxazosin  (CARDURA ) 2 MG tablet Take 1 tablet (2 mg total) by mouth at bedtime. Patient taking differently: Take 2 mg by mouth 2 (two) times daily. 05/12/23   Raulkar, Sven SQUIBB, MD  DULoxetine  (CYMBALTA ) 30 MG capsule Take 1 capsule by mouth once daily Patient taking differently: Take 60 mg by mouth daily. 06/24/23   Raulkar, Sven SQUIBB, MD  furosemide  (LASIX ) 40 MG tablet Take 4 tablets (160 mg total) by mouth daily as needed for edema or fluid. 01/22/24   Arlice Reichert, MD  hydrALAZINE  (APRESOLINE ) 25 MG  tablet Take 25 mg by mouth 3 (three) times daily.    [provider]  Lactulose  20 GM/30ML SOLN Take 30 mLs (20 g total) by mouth in the morning, at noon, and at bedtime for 7 days. 03/23/24 03/30/24  Yolande Lamar BROCKS, MD  lidocaine  (LIDODERM ) 5 % Place 2 patches onto the skin daily at 10 pm. Remove & Discard patch within 12 hours or as directed by MD 05/12/23   Lorilee, Sven SQUIBB, MD  omeprazole (PRILOSEC) 40 MG capsule Take 40 mg by mouth daily. 07/02/23   [provider]  ondansetron  (ZOFRAN ) 4 MG tablet Take 1 tablet (4 mg total) by mouth every 8 (eight) hours as needed for nausea or vomiting. 02/22/24   Delores Shields A, DO  ondansetron  (ZOFRAN -ODT) 4 MG disintegrating tablet Take 1 tablet (4 mg total) by mouth every 8 (eight) hours as needed for nausea or vomiting. 03/23/24   Yolande Lamar BROCKS, MD  oxyCODONE -acetaminophen  (PERCOCET) 10-325 MG tablet Take 1 tablet by mouth 4 (four) times daily as needed for pain. 02/25/24   Debby Fidela CROME, NP  polyethylene glycol (MIRALAX ) 17 g packet Take 17 g by mouth 2 (two) times daily. 03/23/24   Yolande Lamar BROCKS, MD  pramipexole  (MIRAPEX ) 0.5 MG tablet Take 1 tablet (0.5 mg total) by mouth in the morning and at bedtime. 05/12/23   Raulkar, Sven SQUIBB, MD  tamsulosin  (FLOMAX ) 0.4 MG CAPS capsule Take 1 capsule (0.4 mg total) by mouth at bedtime. 04/09/23   Love, Sharlet RAMAN, PA-C  tirzepatide  (MOUNJARO ) 12.5 MG/0.5ML Pen Inject 12.5 mg into the skin once a week. 02/22/24   Delores Shields A, DO  tiZANidine  (ZANAFLEX ) 4 MG tablet Start with 4 mg at bedtime x 4 days, then 4 mg BID x 4 days, then increase to 4 mg in AM and 8 mg at bedtime x 4 days, then 4 mg in AM, 4 mg in Afternoon and 8 mg at bedtime- for spasticity with baclofen  10/19/23   Lovorn, Megan, MD  topiramate  (TOPAMAX ) 25 MG tablet Take 1 tablet (25 mg total) by mouth 2 (two) times daily. 05/12/23   Raulkar, Sven SQUIBB, MD  Vitamin D , Ergocalciferol , (DRISDOL ) 1.25 MG (50000 UNIT) CAPS  capsule TAKE 1 CAPSULE BY MOUTH EVERY 14 DAYS 03/23/24   Delores Shields LABOR, DO    Allergies: Haldol  [haloperidol ], Morphine , and Sulfa  antibiotics    Review of Systems  Respiratory:  Positive for cough and shortness of breath.   Cardiovascular:  Positive for leg swelling.  Gastrointestinal:  Positive for abdominal pain, constipation, nausea and vomiting.  Psychiatric/Behavioral:  Positive for confusion (resolved).   All other systems reviewed and are negative.   Updated Vital Signs BP (!) 179/115   Pulse (!) 110   Temp 98.7 F (37.1 C) (Oral)   Resp 18   SpO2 97%   Physical Exam Vitals and nursing note reviewed.  Constitutional:  General: He is not in acute distress.    Appearance: Normal appearance. He is obese. He is not ill-appearing, toxic-appearing or diaphoretic.  HENT:     Head: Normocephalic and atraumatic.  Cardiovascular:     Rate and Rhythm: Normal rate and regular rhythm.     Heart sounds: Normal heart sounds.  Pulmonary:     Effort: Pulmonary effort is normal. No respiratory distress.     Breath sounds: Normal breath sounds.  Abdominal:     Palpations: Abdomen is soft.     Tenderness: There is abdominal tenderness. There is no guarding or rebound.  Musculoskeletal:        General: Normal range of motion.     Cervical back: Normal range of motion.     Right lower leg: Edema present.     Left lower leg: Edema present.  Skin:    General: Skin is warm and dry.  Neurological:     General: No focal deficit present.     Mental Status: He is alert and oriented to person, place, and time.     GCS: GCS eye subscore is 4. GCS verbal subscore is 5. GCS motor subscore is 6.     Sensory: Sensation is intact.     Motor: Motor function is intact.     Coordination: Coordination is intact.     Gait: Gait is intact.     Comments: Alert and oriented to self, place, time and event.    Speech is fluent, clear without dysarthria or dysphasia.    Strength 5/5 in  upper/lower extremities   Sensation intact in upper/lower extremities    CN I not tested  CN II grossly intact visual fields bilaterally. Did not visualize posterior eye.  CN III, IV, VI PERRLA and EOMs intact bilaterally  CN V Intact sensation to sharp and light touch to the face  CN VII facial movements symmetric  CN VIII not tested  CN IX, X no uvula deviation, symmetric rise of soft palate  CN XI 5/5 SCM and trapezius strength bilaterally  CN XII Midline tongue protrusion, symmetric L/R movements   Psychiatric:        Mood and Affect: Mood normal.        Behavior: Behavior normal.     (all labs ordered are listed, but only abnormal results are displayed) Labs Reviewed  CBC WITH DIFFERENTIAL/PLATELET - Abnormal; Notable for the following components:      Result Value   WBC 13.4 (*)    Neutro Abs 10.4 (*)    All other components within normal limits  COMPREHENSIVE METABOLIC PANEL WITH GFR - Abnormal; Notable for the following components:   Chloride 96 (*)    Glucose, Bld 154 (*)    Creatinine, Ser 1.74 (*)    Total Protein 8.3 (*)    GFR, Estimated 44 (*)    Anion gap 20 (*)    All other components within normal limits  URINALYSIS, W/ REFLEX TO CULTURE (INFECTION SUSPECTED) - Abnormal; Notable for the following components:   Protein, ur 100 (*)    Bacteria, UA RARE (*)    All other components within normal limits  RESP PANEL BY RT-PCR (RSV, FLU A&B, COVID)  RVPGX2  BRAIN NATRIURETIC PEPTIDE  TROPONIN I (HIGH SENSITIVITY)  TROPONIN I (HIGH SENSITIVITY)    EKG: None  Radiology: CT Angio Chest PE W and/or Wo Contrast Result Date: 03/30/2024 EXAM: CTA of the Chest with contrast for PE 03/30/2024 10:29:17 PM TECHNIQUE: CTA of  the chest was performed after the administration of 75 mL of iohexol  (OMNIPAQUE ) 350 MG/ML injection. Multiplanar reformatted images are provided for review. MIP images are provided for review. Automated exposure control, iterative reconstruction,  and/or weight based adjustment of the mA/kV was utilized to reduce the radiation dose to as low as reasonably achievable. COMPARISON: 07/09/2021 CLINICAL HISTORY: Pulmonary embolism (PE) suspected, high prob. Pt reports cough, nausea, vomiting, and SOB since last night. 75 cc Omni350. FINDINGS: PULMONARY ARTERIES: Pulmonary arteries are adequately opacified for evaluation. No pulmonary embolism. Main pulmonary artery is normal in caliber. MEDIASTINUM: The heart and pericardium demonstrate no acute abnormality. There is no acute abnormality of the thoracic aorta. LYMPH NODES: No mediastinal, hilar or axillary lymphadenopathy. LUNGS AND PLEURA: The lungs are without acute process. No focal consolidation or pulmonary edema. No pleural effusion or pneumothorax. UPPER ABDOMEN: Limited images of the upper abdomen are unremarkable. SOFT TISSUES AND BONES: Degenerative changes in the thoracic spine and right shoulder. No acute soft tissue abnormality. IMPRESSION: 1. No pulmonary embolism. 2. No acute findings. Electronically signed by: Franky Crease MD 03/30/2024 10:33 PM EDT RP Workstation: [id number]   CT ABDOMEN PELVIS WO CONTRAST Result Date: 03/30/2024 EXAM: CT ABDOMEN AND PELVIS WITHOUT CONTRAST 03/30/2024 07:53:32 PM TECHNIQUE: CT of the abdomen and pelvis was performed without the administration of intravenous contrast. Multiplanar reformatted images are provided for review. Automated exposure control, iterative reconstruction, and/or weight-based adjustment of the mA/kV was utilized to reduce the radiation dose to as low as reasonably achievable. COMPARISON: 03/23/2024 CLINICAL HISTORY: Acute, nonlocalized abdominal pain. FINDINGS: LOWER CHEST: No acute abnormality. LIVER: The liver is unremarkable. GALLBLADDER AND BILE DUCTS: Gallbladder is unremarkable. No biliary ductal dilatation. SPLEEN: No acute abnormality. PANCREAS: No acute abnormality. ADRENAL GLANDS: No acute abnormality. KIDNEYS, URETERS AND BLADDER:  No stones in the kidneys or ureters. No hydronephrosis. No perinephric or periureteral stranding. Urinary bladder is unremarkable. GI AND BOWEL: Moderate colonic stool burden without evidence of obstruction. Severe sigmoid diverticulosis without superimposed acute inflammatory change. The stomach, small bowel, and large bowel are otherwise unremarkable. Appendix normal. PERITONEUM AND RETROPERITONEUM: No ascites. No free air. VASCULATURE: Mild aortoiliac atherosclerotic calcification. No aortic aneurysm. LYMPH NODES: No lymphadenopathy. REPRODUCTIVE ORGANS: No acute abnormality. BONES AND SOFT TISSUES: There is severe disc space narrowing and endplate remodeling at T8-9 and T10-11 with subchondral sclerosis in keeping with advanced degenerative disc disease. Superimposed grade 1 anterolisthesis of T10-11 of 5 mm bilateral laminectomy and resection of the spinous process of T10 noted. No facet dislocation. No acute fracture identified. Severe degenerative changes are seen throughout the remainder of the thoracolumbar spine. No acute bone abnormality. No lytic or blastic bone lesion. No focal soft tissue abnormality. IMPRESSION: 1. No acute findings in the abdomen or pelvis 2. Moderate colonic stool burden, similar to prior examination, without evidence of obstruction. 3. Severe sigmoid diverticulosis without superimposed acute inflammatory change. 4. 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 5. raf score: Aortic atherosclerosis (icd10-i70.0) Electronically signed by: Dorethia Molt MD 03/30/2024 08:04 PM EDT RP Workstation: [id number]   DG Chest 2 View Result Date: 03/30/2024 CLINICAL DATA:  Shortness of breath. EXAM: CHEST - 2 VIEW COMPARISON:  Chest radiograph dated 03/23/2023. FINDINGS: Cardiomegaly with mild central vascular  congestion. No focal consolidation, pleural effusion or pneumothorax. IMPRESSION: Cardiomegaly with mild central vascular congestion. Electronically Signed   By: Vanetta  Radparvar M.D.   On: 03/30/2024 20:02   CT Head Wo Contrast Result Date: 03/30/2024 EXAM: CT HEAD WITHOUT CONTRAST 03/30/2024 07:53:32 PM TECHNIQUE: CT of the head was performed without the administration of intravenous contrast. Automated exposure control, iterative reconstruction, and/or weight based adjustment of the mA/kV was utilized to reduce the radiation dose to as low as reasonably achievable. COMPARISON: CT head 12/25/2022 CLINICAL HISTORY: Mental status change, unknown cause. FINDINGS: BRAIN AND VENTRICLES: Limited evaluation due to streak artifact. No acute hemorrhage. No evidence of acute infarct. No hydrocephalus. No extra-axial collection. No mass effect or midline shift. ORBITS: No acute abnormality. SINUSES: No acute abnormality. SOFT TISSUES AND SKULL: No acute soft tissue abnormality. No skull fracture. IMPRESSION: 1. No acute intracranial abnormality. Please note evaluation is limited due to streak artifact. Electronically signed by: Morgane Naveau MD 03/30/2024 07:57 PM EDT RP Workstation: [id number]     Procedures   Medications Ordered in the ED - No data to display                                  Medical Decision Making Amount and/or Complexity of Data Reviewed Radiology: ordered.  Risk Prescription drug management.   This patient is a 58 y.o. male who presents to the ED for concern of transient confusion, back pain, shortness of breath, leg swelling, abdominal pain, nausea, vomiting, constipation this involves an extensive number of treatment options, and is a complaint that carries with it a high risk of complications and morbidity. The emergent differential diagnosis prior to evaluation includes, but is not limited to,    Confusion: Drug-related, hypoxia, hyper/hypoglycemia, encephalopathy, sepsis,  DKA/HHS, brain lesion, CVA, seizure, environmental, psychiatric  Back pain: Fracture (acute/chronic), muscle strain, cauda equina, spinal stenosis, DDD, metastatic cancer, vertebral osteomyelitis, kidney stone, pyelonephritis, AAA, pancreatitis, bowel obstruction, meningitis.  Shortness of breath, leg swelling: CHF, pericardial effusion/tamponade, arrhythmias, ACS, COPD, asthma, bronchitis, pneumonia, pneumothorax, PE, anemia  Abdominal pain: AAA, gastroenteritis, appendicitis, Bowel obstruction, Bowel perforation. Gastroparesis, DKA, Hernia, Inflammatory bowel disease, mesenteric ischemia, pancreatitis, peritonitis SBP, volvulus.  This is not an exhaustive differential.   Past Medical History / Co-morbidities / Social History:  has a past medical history of Acute kidney injury superimposed on chronic kidney disease (04/13/2023), Acute on chronic anemia (04/13/2023), Anemia, Anxiety, Back pain, COPD (chronic obstructive pulmonary disease) (HCC), Depression, Diabetes mellitus without complication (HCC), Diverticulosis, Edema, GERD (gastroesophageal reflux disease), GIB (gastrointestinal bleeding), Gout, Heavy smoker, Hyperlipidemia, Hypertension, Iron deficiency anemia, Sleep apnea, and Swelling of lower extremity.  Additional history: Chart reviewed. Pertinent results include: seen on 10/22 for abdominal pain, nausea/vomiting, CT showed constipation, patient discharged home with miralax  and lactulose   Last echo in 2024 shows EF 55-60%, no wall motion abnormalities  Takes Lasix  for peripheral edema  Patient currently on xanax , Tizanadine, and oxycodone    Physical Exam: Physical exam performed. The pertinent findings include: Speaking in complete sentences on room air, mild generalized abdominal tenderness to palpation without rebound or guarding. BLE pitting edema equally  Lab Tests: I ordered, and personally interpreted labs.  The pertinent results include:  WBC 13.4, creatinine 1.74  improved from previous. Bicarb 27, anion gap 20, glucose 154. BNP 89. Troponin 9 --> 10, RVP negative   Imaging Studies: I ordered imaging studies including CT head, CTA PE, CT abdomen pelvis, DG chest. I independently visualized and interpreted imaging which showed   DG chest: Cardiomegaly with mild central vascular congestion.  CT head: 1. No acute intracranial abnormality. Please note evaluation is limited due to streak artifact.   CT abdomen pelvis: 1. No acute findings in the abdomen or pelvis 2. Moderate colonic stool burden, similar to prior examination, without evidence of obstruction. 3. Severe sigmoid diverticulosis without superimposed acute inflammatory change. 4. 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  CTA PE:  1. No pulmonary embolism. 2. No acute findings.  I agree with the radiologist interpretation.   Cardiac Monitoring:  The patient was maintained on a cardiac monitor.  My attending physician viewed and interpreted the cardiac monitored which showed an underlying rhythm of: sinus tachycardia. I agree with this interpretation.   Medications: I ordered medication including lasix , dialudid  for edema, pain. Reevaluation of the patient after these medicines showed that the patient improved. I have reviewed the patients home medicines and have made adjustments as needed.   Disposition: After consideration of the diagnostic results and the patients response to treatment, I feel that emergency department workup does not suggest an emergent condition requiring admission or immediate intervention beyond what has been performed at this time. The plan is: discharge with close outpatient follow-up and return precautions. Given IV lasix  in the ER today with good output. He does not walk and is on  room air without dyspnea. CT is clear. Recommend increased dose of Lasix  for a few days, elevation, and compression. Also recommend continuing laxative regimen given persistent constipation on imaging.  He is able to eat and drink without any residual nausea or vomiting. He is alert and oriented, no focal neurologic deficits, suspect transient confusion is relate to polypharmacy. Recommend holding Xanax , tizanidine , and oxycodone  until he can follow-up with his PCP to discuss these likely side effects. Patients back pain is chronic, unchanged from previous.  No signs or symptoms to suggest cauda equina, no indication for imaging at this time.  Evaluation and diagnostic testing in the emergency department does not suggest an emergent condition requiring admission or immediate intervention beyond what has been performed at this time.  Plan for discharge with close PCP follow-up.  Patient is understanding and amenable with plan, educated on red flag symptoms that would prompt immediate return.  Patient discharged in stable condition.   I discussed this case with my attending physician Dr. Ruthe who cosigned this note including patient's presenting symptoms, physical exam, and planned diagnostics and interventions. Attending physician stated agreement with plan or made changes to plan which were implemented.    Final diagnoses:  Transient confusion  Constipation, unspecified constipation type  Nausea and vomiting, unspecified vomiting type  Generalized abdominal pain  Leg swelling  Shortness of breath  Chronic bilateral low back pain without sciatica  At risk for polypharmacy    ED Discharge Orders     None     An After Visit Summary was printed and given to the patient.      Nora Lauraine DELENA DEVONNA 03/30/24 [id number]    Ruthe Cornet, DO 03/31/24 [id number]

## 2024-03-30 NOTE — ED Notes (Signed)
 IV removed from rt. AC. 2X2 applied with tape.

## 2024-04-06 ENCOUNTER — Ambulatory Visit: Attending: Physical Medicine and Rehabilitation | Admitting: Physical Therapy

## 2024-04-06 DIAGNOSIS — M25562 Pain in left knee: Secondary | ICD-10-CM | POA: Insufficient documentation

## 2024-04-06 DIAGNOSIS — M6281 Muscle weakness (generalized): Secondary | ICD-10-CM | POA: Insufficient documentation

## 2024-04-06 DIAGNOSIS — M25561 Pain in right knee: Secondary | ICD-10-CM | POA: Diagnosis present

## 2024-04-06 DIAGNOSIS — R2681 Unsteadiness on feet: Secondary | ICD-10-CM | POA: Diagnosis present

## 2024-04-06 DIAGNOSIS — R2689 Other abnormalities of gait and mobility: Secondary | ICD-10-CM | POA: Insufficient documentation

## 2024-04-06 DIAGNOSIS — G8929 Other chronic pain: Secondary | ICD-10-CM | POA: Insufficient documentation

## 2024-04-07 ENCOUNTER — Encounter: Payer: Self-pay | Admitting: Physical Therapy

## 2024-04-07 NOTE — Therapy (Signed)
 OUTPATIENT PHYSICAL THERAPY THORACIC/AQUATIC THERAPY NOTE     Patient Name: Raymond [name] MRN: [id number] DOB:Jan 17, 1966, 58 y.o., male Today's Date: 04/07/2024  END OF SESSION:  PT End of Session - 04/07/24 0755     Visit Number 30    Number of Visits 31    Date for Recertification  04/29/24   per recert 03-09-24   Authorization Type [employer] Managed    PT Start Time 1302    PT Stop Time 1400    PT Time Calculation (min) 58 min    Equipment Utilized During Treatment Other (comment)   large noodle   Activity Tolerance Patient tolerated treatment well    Behavior During Therapy [facility] for tasks assessed/performed            Past Medical History:  Diagnosis Date   Acute kidney injury superimposed on chronic kidney disease 04/13/2023   Acute on chronic anemia 04/13/2023   Anemia    Anxiety    Back pain    COPD (chronic obstructive pulmonary disease) (HCC)    Depression    Diabetes mellitus without complication (HCC)    Diverticulosis    with hx of LGIB   Edema    GERD (gastroesophageal reflux disease)    GIB (gastrointestinal bleeding)    Gout    Heavy smoker    Hyperlipidemia    Hypertension    Iron deficiency anemia    Sleep apnea    uses a cpap   Swelling of lower extremity    Past Surgical History:  Procedure Laterality Date   CHONDROPLASTY Left 06/28/2014   Procedure: CHONDROPLASTY;  Surgeon: LELON JONETTA Shari Mickey., MD;  Location: [facility];  Service: Orthopedics;  Laterality: Left;   COLONOSCOPY     FOOT FASCIOTOMY     age 70-rt   KNEE ARTHROSCOPY WITH LATERAL MENISECTOMY Left 06/28/2014   Procedure: KNEE ARTHROSCOPY WITH LATERAL MENISECTOMY;  Surgeon: LELON JONETTA Shari Mickey., MD;  Location: [facility];  Service: Orthopedics;  Laterality: Left;   KNEE ARTHROSCOPY WITH MEDIAL MENISECTOMY Left 06/28/2014   Procedure: LEFT KNEE ARTHROSCOPY CHONDROPLASTY/WITH MEDIAL/LATERAL MENISECTOMIES;  Surgeon: LELON JONETTA Shari Mickey., MD;  Location: [facility];  Service: Orthopedics;  Laterality: Left;   ORIF RADIUS & ULNA FRACTURES  2007   left   THORACIC DISCECTOMY N/A 03/12/2023   Procedure: THORACIC LAMINECTOMY AND  DISCECTOMY;  Surgeon: Gillie Duncans, MD;  Location: [facility];  Service: Neurosurgery;  Laterality: N/A;   Patient Active Problem List   Diagnosis Date Noted   Hematuria 01/18/2024   Chronic incomplete paraplegia (HCC) 10/19/2023   Spasticity 10/19/2023   Wheelchair dependence 10/19/2023   Cystitis 05/21/2023   Acute diverticulitis 05/21/2023   Vitamin D  deficiency 05/14/2023   Low HDL (under 40) 05/14/2023   GERD (gastroesophageal reflux disease) 04/13/2023   Constipation 04/13/2023   Chronic back pain 04/13/2023   DVT, bilateral lower limbs (HCC) 04/13/2023   Coping style affecting medical condition 03/27/2023   Thoracic myelopathy with LE weakness 03/23/2023   HNP (herniated nucleus pulposus with myelopathy), thoracic 03/12/2023   Hyperlipidemia 12/24/2022   Failure to thrive  in adult 12/24/2022   Tobacco abuse 03/18/2018   BPH associated with nocturia 03/11/2017   History of substance abuse (HCC) 03/11/2017   Restless leg syndrome 03/11/2017   Restrictive lung disease 05/14/2016   OSA on CPAP 05/14/2016   Moderate COPD (chronic obstructive pulmonary disease) (HCC) 05/14/2016   Smoking greater than 40 pack years 05/14/2016  SOB (shortness of breath) 10/06/2014   Edema of extremities 10/06/2014   Benign essential HTN 10/06/2014   Type 2 diabetes mellitus, without long-term current use of insulin  (HCC) 07/18/2011    PCP: [name], MD  REFERRING PROVIDER:  Lorilee Railing, MD   REFERRING DIAG: [phone] (ICD-10-CM) - Other spondylosis with myelopathy, thoracic region   THERAPY DIAG:  Other abnormalities of gait and mobility  Muscle weakness (generalized)  Unsteadiness on feet  Chronic pain of right knee  Chronic pain of left knee  Rationale for Evaluation and Treatment:  Rehabilitation  ONSET DATE: October 2024  SUBJECTIVE:                                                                                                                                                                                                         SUBJECTIVE STATEMENT:  Pt reports he was sick last week - had nausea and vomiting; went to ED last Wed. Morning. Feeling better this week.  Pt and wife informed that 1 more pool appt is authorized in this POC and then plan is to D/C  Hand dominance: Right  PERTINENT HISTORY:  Broken R ankle x2; history of COPD, T2DM, OSA, tobacco use, CKD 111a, morbid obesity who was admitted on 03/11/2023 with BLE weakness with decreased coordination and gait disorder as well as reports of urinary incontinence. He was found to have large HNP T10/T11 and underwent T10 laminectomy with discectomy by Dr. Gillie on 03/12/2023   PAIN:  Are you having pain? Yes: NPRS scale: 5/10 Pain location: chronic pain in knees Pain description: throbbing  Aggravating factors: weightbearing  Relieving factors: sitting   PRECAUTIONS: None  RED FLAGS: None     WEIGHT BEARING RESTRICTIONS: No  FALLS:  Has patient fallen in last 6 months? Yes. Number of falls 1 -- last fall Oct 2024  LIVING ENVIRONMENT: Lives with: lives with their spouse Lives in: House/apartment Stairs: ramp Has following equipment at home: Environmental Consultant - 2 wheeled, Wheelchair (manual), and upright walker, hospital bed  OCCUPATION: Retired  PLOF: Able to do his own ADLs but easier to have wife assist him  PATIENT GOALS: Walk with the walker at least household distances  NEXT MD VISIT: 04/21/23 [place]  OBJECTIVE:  Note: Objective measures were completed at Evaluation unless otherwise noted.  DIAGNOSTIC FINDINGS:  03/12/23 MRI THORACIC SPINE IMPRESSION:   1. Moderate-sized lobulated left subarticular to foraminal disc protrusion at T10-11 with resultant severe spinal stenosis. Cord  is compressed and deviated to the right at this level. Associated cord signal changes  concerning for edema and/or myelomalacia. 2. No other acute abnormality within the thoracic spine. 3. Degenerative spondylosis at T8-9 through T11-12 without significant spinal stenosis. Associated moderate to severe bilateral foraminal narrowing at these levels as above.  MRI LUMBAR SPINE IMPRESSION:   1. No acute abnormality within the lumbar spine. 2. Multifactorial degenerative changes at L4-5 with resultant mild canal with severe left and moderate right lateral recess stenosis, with severe bilateral L4 foraminal narrowing. 3. Additional mild noncompressive disc bulging and facet hypertrophy elsewhere within the lumbar spine as above. No other significant stenosis or frank neural impingement.   Critical Value/emergent results were called by telephone at the time of interpretation on 03/12/2023 at 12:41 am to provider Dr. Griselda, who verbally acknowledged these results.  PATIENT SURVEYS:  FOTO 42; predicted 54; 06/15/23- 27; 07/15/23- 37   COGNITION: Overall cognitive status: Within functional limits for tasks assessed  SENSATION: WFL  POSTURE: rounded shoulders and forward head  PALPATION: No overt tenderness to palpation   CERVICAL ROM: WNL  LOWER EXTREMITY MMT:    MMT Right eval Left eval Right 07/15/23 Left 07/15/23 Right 08/17/23 Left  08/17/23  Hip flexion 3+ 3 4+ 3 4 4+  Hip extension 3+ 3+      Hip abduction 3+ 3+ 4 4 4 4   Hip adduction        Hip internal rotation        Hip external rotation        Knee flexion 4- 3+   3+ 3+  Knee extension 4- 3+ 4+ 4+ 3+ 4+  Ankle dorsiflexion   4+ 4+ 4+ 4  Ankle plantarflexion        Ankle inversion        Ankle eversion         (Blank rows = not tested)   FUNCTIONAL TESTS:  Transfers:  Chair to bed: Multiple seated scoots with UEs  Sit<>stand: max A -- limited due to knee pain Supine to sit<>sit: min A for trunk and LE  negotiation. Not performing log rolling  TODAY'S TREATMENT:                                                                                                                              DATE:  04-06-24  Aquatic PT at [facility] - pool temp 90 degrees  Patient seen for aquatic therapy today.  Treatment took place in water 3.6-4.5 feet deep depending upon activity.  Pt entered and exited the pool via chair lift. Pt transferred from wheelchair to chair lift without use of sliding board with CGA;  transferred from chair lift to wheelchair with CGA to min assist, without use of sliding board  Pt performed static standing at pool wall with cues for knee extension - bil. UE support on pool edge;  Gait training with use of pool noodle approx. 10 reps (18' x 20 reps) across pool  Sidestepping with noodle 18' x 4 reps; backwards  amb. With noodle 18' x 4 reps with UE support on pool noodle  Marching in place - 10 reps x 2 sets each leg holding onto noodle  Squats x 10 reps - holding onto pool edge for assist with balance; 2 sets performed during session  Pt performed standing balance exercises - hip flexion, extension, and abduction 10 reps each leg - with UE support on pool edge; pt performed hip abduction/adduction with knee flexed at 90 degrees 10 reps each leg   Pt performed simulated leg press - supported in supine by PT - feet on wall - pt performed 10 reps for hip extensor & quad strengthening  Pt amb. Without UE support on pool noodle (positioned in front of him to have if needed) - 18' x 4 reps - close SBA for safety;  Pt requires buoyancy of water for support for reduced fall risk and for unloading/reduced stress on joints (B knees) as pt able to tolerate increased standing and ambulation in water compared to that on land; viscosity of water is needed for resistance for strengthening and current of water provides perturbations for challenge for balance training           PATIENT  EDUCATION:  Education details: aquatic exercises Person educated: Patient Education method: Programmer, Multimedia, Demonstration, and Handouts Education comprehension: verbalized understanding, returned demonstration, and needs further education  HOME EXERCISE PROGRAM: Access Code: [email or web address] URL: [email or web address] Date: 04/21/2023 Prepared by: Gellen April Marie Nonato  Exercises - Staggered Bridge  - 1 x daily - 7 x weekly - 2 sets - 10 reps - Hooklying Isometric Clamshell  - 1 x daily - 7 x weekly - 2 sets - 10 reps - Small Range Straight Leg Raise  - 1 x daily - 7 x weekly - 2 sets - 10 reps  ASSESSMENT:  CLINICAL IMPRESSION:  Aquatic PT session focused on water walking in various directions with use of noodle for UE support, LE strengthening, and balance training.  Pt had 2-3 occurrences of knee instability resulting in LOB and floatation, requiring mod assist from PT to return to standing position.  Pt amb.  Without use of noodle for UE support later in session as balance improved.  Pt tolerated exercises well.  Plan D/C next session.  Cont with POC.    OBJECTIVE IMPAIRMENTS: Abnormal gait, decreased balance, decreased endurance, decreased mobility, difficulty walking, decreased ROM, decreased strength, increased edema, improper body mechanics, postural dysfunction, obesity, and pain.   GOALS: Goals reviewed with patient? Yes        NEW Long term goals:  Target date 01-29-24= 4 weeks    1.  Independent in HEP for aquatic exercises to be continued after D/C from PT.    Baseline:    Goal status:  Ongoing 02-10-24    2.  Pt will amb. 1 lap across pool with noodle with min assist to be able to continue aquatic HEP with wife's assist.    Baseline:     Goal status:  Ongoing - water walker needed in session on 02-03-24   NEW Long term goals:  Target date 03-11-24= 4 weeks    1.  Independent in HEP for aquatic exercises to be continued after D/C from  PT.    Baseline:    Goal status:  Ongoing 03-09-24    2.  Pt will amb. 1 lap across pool with noodle with supervision to be able to continue aquatic HEP with wife's assist.    Baseline:  Goal status:  Goal met 03-09-24     3.  Pt will stand in 4.5' water depth without UE support for 1 to demo improved standing balance.     Baseline:  pt able to stand approx. 15 secs without UE support on 02-10-24    Goal status:   Goal met 03-09-24  NEW LONG TERM GOALS: Target date 04-15-24 = 4 weeks  1.  Pt will ambulate 5 laps (18' x 10 reps) in pool in 4' water depth with use of noodle prn for assist with balance with supervision only for pt to be able to independently             participate in aquatic exercise upon D/C from PT.  Baseline:  18' x 4 reps with CGA to SBA with noodle; 18' without UE support on noodle with SBA   Goal status;  NEW  2.  Pt will stand in 4.5' water depth without UE support for 3 to demo improved standing balance.   Baseline:  1 on 03-09-24  Goal status:  NEW  3.  Pt will transfer wheelchair to/from chair lift with SBA.           Baseline:  CGA on 03-09-24           Goal status:  NEW  4.   Independent in HEP for aquatic exercises to be continued after D/C from PT.  Baseline:    Goal status:  ONGOING   PLAN:   PT FREQUENCY: 1x/week  PT DURATION: 4 weeks (per renewal 03-09-24)  PLANNED INTERVENTIONS: 02835- PT Re-evaluation, 97110-Therapeutic exercises, 97530- Therapeutic activity, 97112- Neuromuscular re-education, 97535- Self Care, 02859- Manual therapy, [phone]- Gait training, [phone]- Orthotic Fit/training, [phone]- Aquatic Therapy, [phone]- Electrical stimulation (unattended), 97035- Ultrasound, 02966- Ionotophoresis 4mg /ml Dexamethasone , Patient/Family education, Balance training, Stair training, Taping, Dry Needling, Joint mobilization, Spinal mobilization, Cryotherapy, and Moist heat  PLAN FOR NEXT SESSION: check LTG's - plan D/C next session:  gait training, LE  strengthening in the pool  [place], PT 04/07/24 7:58 AM

## 2024-04-11 ENCOUNTER — Ambulatory Visit: Payer: Self-pay | Admitting: Physical Therapy

## 2024-04-18 ENCOUNTER — Ambulatory Visit: Payer: Self-pay | Admitting: Physical Therapy

## 2024-04-18 ENCOUNTER — Encounter: Payer: Self-pay | Admitting: Bariatrics

## 2024-04-18 ENCOUNTER — Ambulatory Visit: Payer: PRIVATE HEALTH INSURANCE | Admitting: Bariatrics

## 2024-04-18 VITALS — BP 132/65 | HR 66 | Ht 71.0 in | Wt 329.0 lb

## 2024-04-18 DIAGNOSIS — K5900 Constipation, unspecified: Secondary | ICD-10-CM

## 2024-04-18 DIAGNOSIS — Z6841 Body Mass Index (BMI) 40.0 and over, adult: Secondary | ICD-10-CM

## 2024-04-18 DIAGNOSIS — E119 Type 2 diabetes mellitus without complications: Secondary | ICD-10-CM | POA: Diagnosis not present

## 2024-04-18 DIAGNOSIS — E669 Obesity, unspecified: Secondary | ICD-10-CM

## 2024-04-18 DIAGNOSIS — K5909 Other constipation: Secondary | ICD-10-CM

## 2024-04-18 DIAGNOSIS — E1122 Type 2 diabetes mellitus with diabetic chronic kidney disease: Secondary | ICD-10-CM

## 2024-04-18 DIAGNOSIS — Z7985 Long-term (current) use of injectable non-insulin antidiabetic drugs: Secondary | ICD-10-CM

## 2024-04-18 MED ORDER — ONDANSETRON HCL 4 MG PO TABS: 4.0000 mg | ORAL_TABLET | Freq: Three times a day (TID) | ORAL | 0 refills | Status: AC | PRN

## 2024-04-18 MED ORDER — TIRZEPATIDE 5 MG/0.5ML ~~LOC~~ SOAJ: 5.0000 mg | SUBCUTANEOUS | 0 refills | Status: DC

## 2024-04-18 NOTE — Progress Notes (Signed)
 WEIGHT SUMMARY AND BIOMETRICS  Weight Lost Since Last Visit: 0  Weight Gained Since Last Visit: 20lb   Vitals BP: 132/65 Pulse Rate: 66 SpO2: 96 %   Anthropometric Measurements Height: 5' 11 (1.803 m) Weight: (!) 329 lb (149.2 kg) BMI (Calculated): 45.91 Weight at Last Visit: 309lb Weight Lost Since Last Visit: 0 Weight Gained Since Last Visit: 20lb Starting Weight: 326lb   No data recorded Other Clinical Data Fasting: no Labs: no Today's Visit #: 13 Starting Date: 05/13/23    OBESITY Raymond [name] is here to discuss his progress with his obesity treatment plan along with follow-up of his obesity related diagnoses.    Nutrition Plan: the Category 4 plan - 0% adherence.  Current exercise: chair exercises  Interim History:  He is up 20 lbs since his last visit. He states that he has been eating what he wants and has stopped his Mounjaro  for about 3 weeks.  He went to the ER several weeks ago and for abdominal pain and was treated for constipation.  He states that his constipation has improved since then although he is still having some difficulty.  He is taking MiraLAX  on a regular basis and an OTC laxative.   and also Fleet enema if needed.  Not eating all of the food on the plan., Protein intake is as prescribed, Is not skipping meals, Not journaling consistently., and Water intake is adequate.   Pharmacotherapy: Raymond [name] is on Mounjaro  12.5 mg SQ weekly, but has not taken the medication for 3 weeks. Adverse side effects: Nausea, occasionally in the past. Hunger is moderately controlled.  Cravings are moderately controlled.  Assessment/Plan:   Constipation Raymond [name] notes constipation.   This is likely related to GLP-1 and his overall diet.  He has been taking MiraLAX , over the counter laxative and uses a Fleet enema if needed.. Constipation is moderately  controlled.   Plan: Increase fiber up to 25 to 30 grams of fiber.   Increase water intake to at least 64 ounces daily.  Add MiraLAX  once daily.  Raymond [name] take twice a day or every other day based on results. Add Metamucil or Citrucel daily. Add magnesium  citrate daily.    Type II Diabetes HgbA1c is at goal. Last A1c was 6.0 CBGs: Not checking      Episodes of hypoglycemia: no Medication(s): Mounjaro  12.5 mg SQ weekly, but has been off the medication for 3 weeks.  Lab Results  Component Value Date   HGBA1C 6.0 (H) 03/12/2023   HGBA1C 6.3 (H) 12/25/2022   Lab Results  Component Value Date   LDLCALC 77 05/13/2023   CREATININE 1.74 (H) 03/30/2024   Lab Results  Component Value Date   GFR 92.19 10/05/2014    Plan: Continue and decrease dose Mounjaro  5.0 mg SQ weekly (as he has been off of the Mounjaro  for approximately 3 weeks).  Will keep  all carbohydrates low both sweets and starches.  Will continue exercise regimen to 30 to 60 minutes on most days of the week.  Aim for 7 to 9 hours of sleep nightly.  He will get back on the plan on a regular basis. He will track his food more frequently.   Generalized Obesity: Current BMI BMI (Calculated): 45.91   Pharmacotherapy Plan Continue and decrease dose  Mounjaro  5.0 mg SQ weekly  Raymond [name] is currently in the action stage of change. As such, his goal is to continue with weight loss efforts.  He has agreed to the Category 4 plan.  Exercise goals: All adults should avoid inactivity. Some physical activity is better than none, and adults who participate in any amount of physical activity gain some health benefits.  Behavioral modification strategies: decreasing simple carbohydrates , no meal skipping, increase water intake, better snacking choices, planning for success, decrease junk food, avoiding temptations, keep healthy foods in the home, weigh protein portions, measure portion sizes, work on smaller portions, and mindful  eating.  Raymond [name] has agreed to follow-up with our clinic in 4 weeks.     Objective:   VITALS: Per patient if applicable, see vitals. GENERAL: Alert and in no acute distress. CARDIOPULMONARY: No increased WOB. Speaking in clear sentences.  PSYCH: Pleasant and cooperative. Speech normal rate and rhythm. Affect is appropriate. Insight and judgement are appropriate. Attention is focused, linear, and appropriate.  NEURO: Oriented as arrived to appointment on time with no prompting.   Attestation Statements:   This was prepared with the assistance of Engineer, Civil (consulting).  Occasional wrong-word or sound-a-like substitutions Danielsen have occurred due to the inherent limitations of voice recognition   Clayborne Daring, DO

## 2024-04-19 ENCOUNTER — Telehealth: Payer: Self-pay

## 2024-04-19 NOTE — Telephone Encounter (Signed)
 PA submitted through Cover My Meds for Mounjaro . Awaiting insurance determination. KEY: [id number]

## 2024-04-27 ENCOUNTER — Encounter: Attending: Physical Medicine and Rehabilitation | Admitting: Physical Medicine and Rehabilitation

## 2024-04-27 ENCOUNTER — Encounter: Payer: Self-pay | Admitting: Physical Medicine and Rehabilitation

## 2024-04-27 ENCOUNTER — Telehealth: Payer: Self-pay

## 2024-04-27 VITALS — BP 182/77 | HR 99 | Ht 71.0 in

## 2024-04-27 DIAGNOSIS — I509 Heart failure, unspecified: Secondary | ICD-10-CM | POA: Diagnosis present

## 2024-04-27 DIAGNOSIS — R600 Localized edema: Secondary | ICD-10-CM | POA: Diagnosis present

## 2024-04-27 DIAGNOSIS — M545 Low back pain, unspecified: Secondary | ICD-10-CM | POA: Insufficient documentation

## 2024-04-27 DIAGNOSIS — M25511 Pain in right shoulder: Secondary | ICD-10-CM | POA: Diagnosis present

## 2024-04-27 DIAGNOSIS — G894 Chronic pain syndrome: Secondary | ICD-10-CM | POA: Insufficient documentation

## 2024-04-27 DIAGNOSIS — M25512 Pain in left shoulder: Secondary | ICD-10-CM | POA: Diagnosis present

## 2024-04-27 DIAGNOSIS — M542 Cervicalgia: Secondary | ICD-10-CM | POA: Diagnosis present

## 2024-04-27 DIAGNOSIS — G8929 Other chronic pain: Secondary | ICD-10-CM | POA: Diagnosis present

## 2024-04-27 DIAGNOSIS — G8222 Paraplegia, incomplete: Secondary | ICD-10-CM | POA: Diagnosis present

## 2024-04-27 MED ORDER — PROMETHAZINE HCL 12.5 MG PO TABS: 12.5000 mg | ORAL_TABLET | Freq: Four times a day (QID) | ORAL | 5 refills | Status: DC | PRN

## 2024-04-27 MED ORDER — TIZANIDINE HCL 4 MG PO TABS: 4.0000 mg | ORAL_TABLET | Freq: Three times a day (TID) | ORAL | 5 refills | Status: AC

## 2024-04-27 MED ORDER — OXYCODONE-ACETAMINOPHEN 10-325 MG PO TABS: 1.0000 | ORAL_TABLET | Freq: Four times a day (QID) | ORAL | 0 refills | Status: DC | PRN

## 2024-04-27 MED ORDER — TIZANIDINE HCL 4 MG PO TABS: ORAL_TABLET | ORAL | 5 refills | Status: DC

## 2024-04-27 MED ORDER — BACLOFEN 20 MG PO TABS: 20.0000 mg | ORAL_TABLET | Freq: Three times a day (TID) | ORAL | 5 refills | Status: DC

## 2024-04-27 NOTE — Telephone Encounter (Signed)
 [facility] call the Tizanidine  4 MG is missing instructions how to take. Please Re-submit Rx. Thank you

## 2024-04-27 NOTE — Patient Instructions (Signed)
 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 Incomplete paraplegia   Needs to maximize meds- needs Cardiologist for  fluid overload.    2. Would benefit trying condom catheter to help since peeing all night.    3. Takes Zofran  /Ondansetron - 3x/day-  can cause constipation- ok with takes Miralax .    4. Phenergan  12.5 mg up to 4x/day as needed for nausea/vomiting.  Take before gets out of bed- and then 20-30 minutes before takes your pills-   5.   Don't drink milk- just gatorade free- water it down, gingerale diet- or pedialyte- low sodium?   6.  CHF- Pt with severe fluid overload- most likely CHF based on PCP's note- but taking Lasix  80 mg BID and 40 mg at bedtime; also Metolazone - 4+ LE edema- nothing is working to keep it controlled. Eat slow salt diet. - please see ASAP so can try to stay out of ED   7. Con't Zanaflex /Tizanidine  4 mg 3x/day- needs refills- cannot increase due to   8. Will reduce Baclofen  to 20 mg 3x/day- due to renal issues- last Cr 1.71  9.  Cannot increase baclofen  or Zanaflex  due to side effects and Renal issues-  can do dantrolene if need be.  Want to have heart and swelling of legs to be in better place before I start it.    10.  Last Oral drug screen July- (6/30) so not due right now- per clinic policy.   11.  If breathing gets worse/swelling gets worse, go to ED- I suggest earlier in day. 10-11am?   12. F/U in 2 months with Fidela and 4 months with me  Double appt- SCI

## 2024-04-27 NOTE — Progress Notes (Signed)
 Subjective:    Patient ID: Raymond [name], male    DOB: 1965/07/30, 58 y.o.   MRN: [id number]  HPI  Pt is a 58 yr old male with hx of  HTN,  DM-  A1c 6.2- Monjaro- ; post op pain, spasticity;  Morbid obesity- max weight 400 lbs- 322 lbs today;  Here for SCI f/u.  Incomplete paraplegia  Been seeing Fidela- 02/25/24   Was doing well with SCI   Yesterday  Going to [employer]- right up the road from [place] area- able to see him faster-   2x/week at least- sometimes 3x/week Also swimming at [employer]- just ended Monday Progressed enough to go to [facility] pool- would need help Walking in water pretty well  And walked 10 steps yesterday with RW and Assistance with PT.    Spasticity-  still an issue-   Tizanidine  4 4 mg TID And sleeps well with that- higher dose made him sleepy.  Baclofen  20 mg 4x/day.   Was on Mounjaro - since came off it- voimted daily except 4 days for 3 weeks.  Has appt with GI 12/23.   Was really constipated on Mounjaro  and then started vomiting after got unclogged .   Went to [employer]- had been down to 300 lbs, but really swollen- cannot keep fluid off.  Up to 329 lbs.  Is reducing his salt- adds salt sometimes.   Has CHF per pt- but doesn't have Cardiologist But [facility] told him heart was fine- 6 years ago.   Takes 80 mg Lasix  in AM, and after noon 80 mg and 40 mg at bedtime.  Metolazone- as  3x/day as needed-  Depends on the fluid.  And then pees all night.     If takes miralax , ok with constipation- but has bowel incontinence when passes gas.   Coughing more than normal today- still vomiting.  Mainly clear when can get something up.    Pain Inventory Average Pain 10 Pain Right Now 10 My pain is constant, sharp, burning, stabbing, and tingling  In the last 24 hours, has pain interfered with the following? General activity 6 Relation with others 8 Enjoyment of life 5 What TIME of day is your pain at its worst? morning   and evening Sleep (in general) NA  Pain is worse with: walking, bending, inactivity, standing, and some activites Pain improves with: rest, therapy/exercise, and medication Relief from Meds: 9  Family History  Problem Relation Age of Onset   Diabetes Mother    Hypertension Mother    High Cholesterol Mother    Heart disease Mother    Depression Mother    Diabetes Father    Hypertension Father    High Cholesterol Father    Heart disease Father    Cancer Other    Social History   Socioeconomic History   Marital status: Married    Spouse name: Not on file   Number of children: Not on file   Years of education: Not on file   Highest education level: Not on file  Occupational History   Not on file  Tobacco Use   Smoking status: Former    Current packs/day: 2.00    Types: Cigarettes   Smokeless tobacco: Never  Vaping Use   Vaping status: Never Used  Substance and Sexual Activity   Alcohol use: Yes    Comment: rare   Drug use: No   Sexual activity: Not on file  Other Topics Concern   Not on  file  Social History Narrative   Not on file   Social Drivers of Health   Financial Resource Strain: Not on file  Food Insecurity: No Food Insecurity (01/18/2024)   Hunger Vital Sign    Worried About Running Out of Food in the Last Year: Never true    Ran Out of Food in the Last Year: Never true  Transportation Needs: No Transportation Needs (01/18/2024)   PRAPARE - Administrator, Civil Service (Medical): No    Lack of Transportation (Non-Medical): No  Physical Activity: Not on file  Stress: No Stress Concern Present (03/21/2022)   Received from [facility] - Occupational Stress Questionnaire    Feeling of Stress : Not at all  Social Connections: Unknown (01/10/2022)   Received from [facility]   Social Network    Social Network: Not on file   Past Surgical History:  Procedure Laterality Date   CHONDROPLASTY Left  06/28/2014   Procedure: CHONDROPLASTY;  Surgeon: LELON JONETTA Shari Mickey., MD;  Location: [facility];  Service: Orthopedics;  Laterality: Left;   COLONOSCOPY     FOOT FASCIOTOMY     age 35-rt   KNEE ARTHROSCOPY WITH LATERAL MENISECTOMY Left 06/28/2014   Procedure: KNEE ARTHROSCOPY WITH LATERAL MENISECTOMY;  Surgeon: LELON JONETTA Shari Mickey., MD;  Location: [facility];  Service: Orthopedics;  Laterality: Left;   KNEE ARTHROSCOPY WITH MEDIAL MENISECTOMY Left 06/28/2014   Procedure: LEFT KNEE ARTHROSCOPY CHONDROPLASTY/WITH MEDIAL/LATERAL MENISECTOMIES;  Surgeon: LELON JONETTA Shari Mickey., MD;  Location: [facility];  Service: Orthopedics;  Laterality: Left;   ORIF RADIUS & ULNA FRACTURES  2007   left   THORACIC DISCECTOMY N/A 03/12/2023   Procedure: THORACIC LAMINECTOMY AND  DISCECTOMY;  Surgeon: Gillie Duncans, MD;  Location: [facility] OR;  Service: Neurosurgery;  Laterality: N/A;   Past Surgical History:  Procedure Laterality Date   CHONDROPLASTY Left 06/28/2014   Procedure: CHONDROPLASTY;  Surgeon: LELON JONETTA Shari Mickey., MD;  Location: [facility];  Service: Orthopedics;  Laterality: Left;   COLONOSCOPY     FOOT FASCIOTOMY     age 35-rt   KNEE ARTHROSCOPY WITH LATERAL MENISECTOMY Left 06/28/2014   Procedure: KNEE ARTHROSCOPY WITH LATERAL MENISECTOMY;  Surgeon: LELON JONETTA Shari Mickey., MD;  Location: [facility];  Service: Orthopedics;  Laterality: Left;   KNEE ARTHROSCOPY WITH MEDIAL MENISECTOMY Left 06/28/2014   Procedure: LEFT KNEE ARTHROSCOPY CHONDROPLASTY/WITH MEDIAL/LATERAL MENISECTOMIES;  Surgeon: LELON JONETTA Shari Mickey., MD;  Location: [facility];  Service: Orthopedics;  Laterality: Left;   ORIF RADIUS & ULNA FRACTURES  2007   left   THORACIC DISCECTOMY N/A 03/12/2023   Procedure: THORACIC LAMINECTOMY AND  DISCECTOMY;  Surgeon: Gillie Duncans, MD;  Location: [facility] OR;  Service: Neurosurgery;  Laterality: N/A;   Past Medical History:  Diagnosis Date   Acute  kidney injury superimposed on chronic kidney disease 04/13/2023   Acute on chronic anemia 04/13/2023   Anemia    Anxiety    Back pain    COPD (chronic obstructive pulmonary disease) (HCC)    Depression    Diabetes mellitus without complication (HCC)    Diverticulosis    with hx of LGIB   Edema    GERD (gastroesophageal reflux disease)    GIB (gastrointestinal bleeding)    Gout    Heavy smoker    Hyperlipidemia    Hypertension    Iron deficiency anemia  Sleep apnea    uses a cpap   Swelling of lower extremity    BP (!) 182/77 Comment: was 186/79 at home  Pulse 99   Ht 5' 11 (1.803 m)   SpO2 95%   BMI 45.89 kg/m   Opioid Risk Score:   Fall Risk Score:  `1  Depression screen [facility] 2/9     04/27/2024   11:19 AM 01/11/2024   12:36 PM 11/30/2023    9:25 AM 10/19/2023   10:59 AM 09/25/2023   10:50 AM  Depression screen PHQ 2/9  Decreased Interest 1 1 0 0 0  Down, Depressed, Hopeless 1 1 0 0 0  PHQ - 2 Score 2 2 0 0 0  Altered sleeping    0   Tired, decreased energy    0   Change in appetite    0   Feeling bad or failure about yourself     0   Trouble concentrating    0   Moving slowly or fidgety/restless    0   Suicidal thoughts    0   PHQ-9 Score    0       Data saved with a previous flowsheet row definition     Review of Systems  Gastrointestinal:  Positive for constipation, nausea and vomiting.  Musculoskeletal:  Positive for arthralgias, back pain and gait problem.  Psychiatric/Behavioral:  Positive for dysphoric mood.   All other systems reviewed and are negative.      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 Incomplete paraplegia   Needs to maximize meds- needs Cardiologist for  fluid overload.    2. Would benefit trying condom catheter to help since peeing all night.    3. Takes Zofran  /Ondansetron - 3x/day-  can cause constipation- ok with takes Miralax .    4. Phenergan  12.5 mg up to 4x/day as needed for nausea/vomiting.  Take before gets out of bed- and then 20-30 minutes before takes your pills-   5.   Don't drink milk- just gatorade free- water it down, gingerale diet- or pedialyte- low sodium?   6.  CHF- Pt with severe fluid overload- most likely CHF based on PCP's note- but taking Lasix  80 mg BID and 40 mg at bedtime; also Metolazone - 4+ LE edema- nothing is working to keep it controlled. Eat slow salt diet. - please see ASAP so can try to stay out of ED   7. Con't Zanaflex /Tizanidine  4 mg 3x/day- needs refills- cannot increase due to   8. Will reduce Baclofen  to 20 mg 3x/day- due to renal issues- last Cr 1.71  9.  Cannot increase baclofen  or Zanaflex  due to side effects and Renal issues-  can do dantrolene if need be.  Want to have heart and swelling of legs to be in better place before I start it.    10.  Last Oral drug screen July- (6/30) so not due right now- per clinic policy.   11.  If breathing gets worse/swelling gets worse, go to ED- I suggest earlier in day. 10-11am?   12. F/U in 2 months with Fidela and 4 months with me  Double appt- SCI Chronic pain with EUNICE  If Spasticity is worse, and CHF is controlled, then Fidela can start Dantrolene with talking with me.    I spent a total of  44  minutes on total care today- >50% coordination of care- due to d/w pt about CHF, CKD, LE edema; concern pt is in acute on chronic CHF; d/w pt about spasticity- N/V- added phenergan 

## 2024-05-10 ENCOUNTER — Encounter: Payer: Self-pay | Admitting: Cardiovascular Disease

## 2024-05-10 ENCOUNTER — Ambulatory Visit: Payer: PRIVATE HEALTH INSURANCE | Attending: Cardiovascular Disease | Admitting: Cardiovascular Disease

## 2024-05-10 VITALS — BP 170/90 | HR 85 | Ht 70.0 in | Wt 320.0 lb

## 2024-05-10 DIAGNOSIS — I5032 Chronic diastolic (congestive) heart failure: Secondary | ICD-10-CM

## 2024-05-10 DIAGNOSIS — Z79899 Other long term (current) drug therapy: Secondary | ICD-10-CM

## 2024-05-10 DIAGNOSIS — I10 Essential (primary) hypertension: Secondary | ICD-10-CM

## 2024-05-10 DIAGNOSIS — R112 Nausea with vomiting, unspecified: Secondary | ICD-10-CM

## 2024-05-10 NOTE — Progress Notes (Signed)
 Cardiology Office Note:    Date:  05/10/2024   ID:  [name] Raymond Klemp, DOB 27-Mar-1966, MRN [id number]  PCP:  [name], MD   [facility] Providers Cardiologist:  None     Referring MD: Cornelio Bouchard, MD   Chief Complaint  Patient presents with   Hypertension    History of Present Illness:    Raymond [name] is a 58 y.o. male presenting for evaluation of heart failure. The patient reportedly has a hx of diastolic heart failure and underwent evaluation at [name] about 8 years ago. He is here with his wife today. He appears to be chronically ill, in a wheelchair with very limited ability to walk even very short distance. He's been managed with high dose diuretic Rx, currently taking furosemide  160 mg BID and using metolazone as needed for weight gain/edema. An echo in our system from 2024 shows LVEF 55-60%, normal RV function, and no valvular disease. The patient has been struggling with nausea and vomiting for several weeks on and off. He has an upcoming GI evaluation later this month. He has been vomiting repeatedly the past few days. Currently, they report his edema is well controlled and he has no signifcant dyspnea at rest, orthopnea, or PND. He was able to pedal a stationary bike for 10 minutes last week with no exertional chest pain or shortness of breath.   Past Medical History:  Diagnosis Date   Acute kidney injury superimposed on chronic kidney disease 04/13/2023   Acute on chronic anemia 04/13/2023   Anemia    Anxiety    Back pain    COPD (chronic obstructive pulmonary disease) (HCC)    Depression    Diabetes mellitus without complication (HCC)    Diverticulosis    with hx of LGIB   Edema    GERD (gastroesophageal reflux disease)    GIB (gastrointestinal bleeding)    Gout    Heavy smoker    Hyperlipidemia    Hypertension    Iron deficiency anemia    Sleep apnea    uses a cpap   Swelling of lower extremity    Past Surgical History:  Procedure  Laterality Date   CHONDROPLASTY Left 06/28/2014   Procedure: CHONDROPLASTY;  Surgeon: LELON JONETTA Shari Mickey., MD;  Location: [facility];  Service: Orthopedics;  Laterality: Left;   COLONOSCOPY     FOOT FASCIOTOMY     age 58-rt   KNEE ARTHROSCOPY WITH LATERAL MENISECTOMY Left 06/28/2014   Procedure: KNEE ARTHROSCOPY WITH LATERAL MENISECTOMY;  Surgeon: LELON JONETTA Shari Mickey., MD;  Location: [facility];  Service: Orthopedics;  Laterality: Left;   KNEE ARTHROSCOPY WITH MEDIAL MENISECTOMY Left 06/28/2014   Procedure: LEFT KNEE ARTHROSCOPY CHONDROPLASTY/WITH MEDIAL/LATERAL MENISECTOMIES;  Surgeon: LELON JONETTA Shari Mickey., MD;  Location[facility];  Service: Orthopedics;  Laterality: Left;   ORIF RADIUS & ULNA FRACTURES  2007   left   THORACIC DISCECTOMY N/A 03/12/2023   Procedure: THORACIC LAMINECTOMY AND  DISCECTOMY;  Surgeon: Gillie Duncans, MD;  Location: [facility];  Service: Neurosurgery;  Laterality: N/A;     Current Medications: Current Meds  Medication Sig   albuterol  (VENTOLIN  HFA) 108 (90 Base) MCG/ACT inhaler Inhale 2 puffs into the lungs every 6 (six) hours as needed for wheezing or shortness of breath.   allopurinol  (ZYLOPRIM ) 300 MG tablet Take 1 tablet (300 mg total) by mouth daily.   ALPRAZolam  (XANAX ) 0.5 MG tablet Take 1 tablet (0.5 mg total)  by mouth 2 (two) times daily as needed.   amLODipine  (NORVASC ) 10 MG tablet Take 10 mg by mouth daily.   aspirin EC 81 MG tablet Take 81 mg by mouth daily. Swallow whole.   atorvastatin  (LIPITOR) 40 MG tablet Take 1 tablet (40 mg total) by mouth every evening.   baclofen  (LIORESAL ) 20 MG tablet Take 1 tablet (20 mg total) by mouth 3 (three) times daily.   carvedilol  (COREG ) 25 MG tablet Take 1 tablet (25 mg total) by mouth in the morning and at bedtime.   diclofenac  Sodium (VOLTAREN ) 1 % GEL Apply 4 g topically 4 (four) times daily. To bilateral knees   diphenhydrAMINE  (BENADRYL ) 25 mg capsule Take 25 mg by mouth as  needed.   doxazosin  (CARDURA ) 2 MG tablet Take 1 tablet (2 mg total) by mouth at bedtime. (Patient taking differently: Take 2 mg by mouth 2 (two) times daily.)   DULoxetine  (CYMBALTA ) 30 MG capsule Take 1 capsule by mouth once daily (Patient taking differently: Take 60 mg by mouth daily.)   DULoxetine  (CYMBALTA ) 60 MG capsule Take 60 mg by mouth daily.   furosemide  (LASIX ) 80 MG tablet Take 80 mg by mouth 2 (two) times daily.   hydrALAZINE  (APRESOLINE ) 25 MG tablet Take 25 mg by mouth 3 (three) times daily.   lidocaine  (LIDODERM ) 5 % Place 2 patches onto the skin daily at 10 pm. Remove & Discard patch within 12 hours or as directed by MD   metolazone (ZAROXOLYN) 2.5 MG tablet Take 2.5 mg by mouth daily.   omeprazole (PRILOSEC) 40 MG capsule Take 40 mg by mouth daily.   ondansetron  (ZOFRAN ) 4 MG tablet Take 1 tablet (4 mg total) by mouth every 8 (eight) hours as needed for nausea or vomiting.   oxyCODONE -acetaminophen  (PERCOCET) 10-325 MG tablet Take 1 tablet by mouth every 6 (six) hours as needed for pain. 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   pramipexole  (MIRAPEX ) 0.5 MG tablet Take 1 tablet (0.5 mg total) by mouth in the morning and at bedtime.   promethazine  (PHENERGAN ) 12.5 MG tablet Take 1 tablet (12.5 mg total) by mouth every 6 (six) hours as needed for refractory nausea / vomiting.   tamsulosin  (FLOMAX ) 0.4 MG CAPS capsule Take 1 capsule (0.4 mg total) by mouth at bedtime.   tirzepatide  (MOUNJARO ) 5 MG/0.5ML Pen Inject 5 mg into the skin once a week.   tiZANidine  (ZANAFLEX ) 4 MG tablet Take 1 tablet (4 mg total) by mouth 3 (three) times daily. - for spasticity with baclofen    topiramate  (TOPAMAX ) 25 MG tablet Take 1 tablet (25 mg total) by mouth 2 (two) times daily.   Vitamin D , Ergocalciferol , (DRISDOL ) 1.25 MG (50000 UNIT) CAPS capsule TAKE 1 CAPSULE BY MOUTH EVERY 14 DAYS   [DISCONTINUED]  furosemide  (LASIX ) 40 MG tablet Take 4 tablets (160 mg total) by mouth daily as needed for edema or fluid.     Allergies:   Haldol  [haloperidol ], Morphine , and Sulfa  antibiotics   ROS:   Please see the history of present illness.    All other systems reviewed and are negative.  EKGs/Labs/Other Studies Reviewed:    The following studies were reviewed today: Cardiac Studies & Procedures   ______________________________________________________________________________________________   STRESS TESTS  MYOCARDIAL PERFUSION IMAGING 10/19/2014  Interpretation Summary Lexiscan  myoview  Electrically negative for ischemia Myoview scan with minimal thinning inferiorly consistent with  soft tissue attenuation, otherwise probable normal perfusion No evid for ischemia or scar. LVEF calculated at 59% with normal wall motion   ECHOCARDIOGRAM  ECHOCARDIOGRAM COMPLETE 12/25/2022  Narrative ECHOCARDIOGRAM REPORT    Patient Name:   Jaquarious JAYSON Huffaker Date of Exam: 12/25/2022 Medical Rec #:  [id number]   Height:       71.0 in Accession #:    [id number]  Weight:       345.0 lb Date of Birth:  Jul 09, 1965    BSA:          2.658 m Patient Age:    57 years    BP:           171/94 mmHg Patient Gender: M           HR:           100 bpm. Exam Location:  Inpatient  Procedure: 2D Echo, Cardiac Doppler, Color Doppler and Intracardiac Opacification Agent  Indications:    I50.21 Acute systolic (congestive) heart failure  History:        Patient has prior history of Echocardiogram examinations, most recent 10/19/2014. COPD; Risk Factors:Diabetes, Dyslipidemia and Hypertension.  Sonographer:    Tinnie Gosling RDCS Referring Phys: [id number] [facility] GOEL  IMPRESSIONS   1. Technically difficult study with very limited visualization of cardiac structures. 2. Left ventricular ejection fraction, by estimation, is 55 to 60%. The left ventricle has normal function. The left ventricle has no regional wall motion  abnormalities. Left ventricular diastolic parameters were normal. 3. Right ventricular systolic function is normal. The right ventricular size is normal. 4. The mitral valve was not well visualized. No evidence of mitral valve regurgitation. No evidence of mitral stenosis. 5. The aortic valve was not well visualized. Aortic valve regurgitation is not visualized. No aortic stenosis is present. 6. The inferior vena cava is dilated in size with <50% respiratory variability, suggesting right atrial pressure of 15 mmHg.  FINDINGS Left Ventricle: Left ventricular ejection fraction, by estimation, is 55 to 60%. The left ventricle has normal function. The left ventricle has no regional wall motion abnormalities. Definity  contrast agent was given IV to delineate the left ventricular endocardial borders. The left ventricular internal cavity size was normal in size. There is no left ventricular hypertrophy. Left ventricular diastolic parameters were normal.  Right Ventricle: The right ventricular size is normal. No increase in right ventricular wall thickness. Right ventricular systolic function is normal.  Left Atrium: Left atrial size was normal in size.  Right Atrium: Right atrial size was normal in size.  Pericardium: There is no evidence of pericardial effusion.  Mitral Valve: The mitral valve was not well visualized. No evidence of mitral valve regurgitation. No evidence of mitral valve stenosis.  Tricuspid Valve: The tricuspid valve is not well visualized. Tricuspid valve regurgitation is not demonstrated. No evidence of tricuspid stenosis.  Aortic Valve: The aortic valve was not well visualized. Aortic valve regurgitation is not visualized. No aortic stenosis is present.  Pulmonic Valve: The pulmonic valve was not assessed. Pulmonic valve regurgitation is not visualized. No evidence of pulmonic stenosis.  Aorta: The aortic root is normal in size and structure.  Venous: The inferior vena cava  is dilated in size with less than 50% respiratory variability, suggesting right atrial pressure of 15 mmHg.  IAS/Shunts: No atrial level shunt detected by color flow Doppler.   LEFT VENTRICLE PLAX 2D LVIDd:         6.10 cm LVIDs:  4.90 cm LV PW:         1.10 cm LV IVS:        1.10 cm LVOT diam:     2.30 cm LV SV:         106 LV SV Index:   40 LVOT Area:     4.15 cm   IVC IVC diam: 3.00 cm  LEFT ATRIUM              Index        RIGHT ATRIUM           Index LA diam:        4.60 cm  1.73 cm/m   RA Area:     12.50 cm LA Vol (A2C):   75.8 ml  28.51 ml/m  RA Volume:   25.90 ml  9.74 ml/m LA Vol (A4C):   112.0 ml 42.13 ml/m LA Biplane Vol: 97.3 ml  36.60 ml/m AORTIC VALVE LVOT Vmax:   124.00 cm/s LVOT Vmean:  78.200 cm/s LVOT VTI:    0.256 m  AORTA Ao Root diam: 3.10 cm Ao Asc diam:  3.00 cm   SHUNTS Systemic VTI:  0.26 m Systemic Diam: 2.30 cm  Aditya Sabharwal Electronically signed by Ria Commander Signature Date/Time: 12/25/2022/3:43:25 PM    Final          ______________________________________________________________________________________________      EKG:        Recent Labs: 05/13/2023: TSH 2.710 01/19/2024: Magnesium  1.6 03/30/2024: ALT 17; B Natriuretic Peptide 89.4; BUN 19; Creatinine, Ser 1.74; Hemoglobin 13.4; Platelets 341; Potassium 3.8; Sodium 143  Recent Lipid Panel    Component Value Date/Time   CHOL 139 05/13/2023 1057   TRIG 145 05/13/2023 1057   HDL 36 (L) 05/13/2023 1057   LDLCALC 77 05/13/2023 1057          Physical Exam:    VS:  BP (!) 170/90 (BP Location: Right Arm, Patient Position: Sitting, Cuff Size: Large)   Pulse 85   Ht 5' 10 (1.778 m)   Wt (!) 320 lb (145.2 kg)   SpO2 96%   BMI 45.92 kg/m     Wt Readings from Last 3 Encounters:  05/10/24 (!) 320 lb (145.2 kg)  04/18/24 (!) 329 lb (149.2 kg)  03/21/24 (!) 309 lb (140.2 kg)     GEN:  obese, chronically ill appearing male in no acute  distress HEENT: Normal NECK: No JVD; No carotid bruits LYMPHATICS: No lymphadenopathy CARDIAC: RRR, no murmurs, rubs, gallops RESPIRATORY:  Clear to auscultation without rales, wheezing or rhonchi  ABDOMEN: Soft, non-tender MUSCULOSKELETAL:  trace bilateral lower extremity edema; No deformity  SKIN: Warm and dry NEUROLOGIC:  Alert and oriented x 3 PSYCHIATRIC:  Normal affect   Assessment & Plan Chronic heart failure with preserved ejection fraction (HFpEF) (HCC) Appears euvolemic on exam. No volume overload. On high-dose diuretics despite several days of nausea and vomiting. Concerned about renal function/metabolic derangement. Vital signs are ok. Check CBC/CMET. Check updated 2D echocardiogram. Discussed concerns about polypharmacy and need to review indications for all of his many medications with his primary providers.  Medication management Await labs, likely need to decrease diuretic rx.  Benign essential HTN BP elevated. Avoid med titration in the setting of other problems outlined above.  Nausea and vomiting, unspecified vomiting type Advised I would have a low threshold to seek ER care if symptoms persist or he can't keep liquids or medication down. Also advised I would review labs and advise  on ER if any major changes from baseline.  Follow-up APP 3 months     Medication Adjustments/Labs and Tests Ordered: Current medicines are reviewed at length with the patient today.  Concerns regarding medicines are outlined above.  Orders Placed This Encounter  Procedures   CBC   Comprehensive metabolic panel with GFR   EKG 87-Ozji   ECHOCARDIOGRAM COMPLETE   No orders of the defined types were placed in this encounter.   Patient Instructions  Medication Instructions:  The current medical regimen is effective;  continue present plan and medications.  *If you need a refill on your cardiac medications before your next appointment, please call your pharmacy*  Lab Work: Please  have blood work today (CBC, CMP) If you have labs (blood work) drawn today and your tests are completely normal, you will receive your results only by: MyChart Message (if you have MyChart) OR A paper copy in the mail If you have any lab test that is abnormal or we need to change your treatment, we will call you to review the results.  Testing/Procedures: Your physician has requested that you have an echocardiogram. Echocardiography is a painless test that uses sound waves to create images of your heart. It provides your doctor with information about the size and shape of your heart and how well your heart's chambers and valves are working. This procedure takes approximately one hour. There are no restrictions for this procedure. Please do NOT wear cologne, perfume, aftershave, or lotions (deodorant is allowed). Please arrive 15 minutes prior to your appointment time.  Please note: We ask at that you not bring children with you during ultrasound (echo/ vascular) testing. Due to room size and safety concerns, children are not allowed in the ultrasound rooms during exams. Our front office staff cannot provide observation of children in our lobby area while testing is being conducted. An adult accompanying a patient to their appointment will only be allowed in the ultrasound room at the discretion of the ultrasound technician under special circumstances. We apologize for any inconvenience.   Follow-Up: At [facility], you and your health needs are our priority.  As part of our continuing mission to provide you with exceptional heart care, our providers are all part of one team.  This team includes your primary Cardiologist (physician) and Advanced Practice Providers or APPs (Physician Assistants and Nurse Practitioners) who all work together to provide you with the care you need, when you need it.  Your next appointment:   3 month(s)  Provider:   One of our Advanced Practice Providers  (APPs): Morse Clause, PA-C  Lamarr Satterfield, NP Miriam Shams, NP  Olivia Pavy, PA-C Josefa Beauvais, NP  Leontine Salen, PA-C Orren Fabry, PA-C  Hao Meng, PA-C Ernest Dick, NP  Damien Braver, NP Jon Hails, PA-C  Waddell Donath, PA-C    Dayna Dunn, PA-C  Scott Weaver, PA-C Lum Louis, NP Katlyn West, NP Callie Goodrich, PA-C  Xika Zhao, NP Sheng Haley, PA-C    Kathleen Johnson, PA-C       We recommend signing up for the patient portal called MyChart.  Sign up information is provided on this After Visit Summary.  MyChart is used to connect with patients for Virtual Visits (Telemedicine).  Patients are able to view lab/test results, encounter notes, upcoming appointments, etc.  Non-urgent messages can be sent to your provider as well.   To learn more about what you can do with MyChart, go to [email or web address].  Signed, Ozell Fell, MD  05/10/2024 10:29 AM    [facility]

## 2024-05-10 NOTE — Assessment & Plan Note (Signed)
 BP elevated. Avoid med titration in the setting of other problems outlined above.

## 2024-05-10 NOTE — Patient Instructions (Signed)
 Medication Instructions:  The current medical regimen is effective;  continue present plan and medications.  *If you need a refill on your cardiac medications before your next appointment, please call your pharmacy*  Lab Work: Please have blood work today (CBC, CMP) If you have labs (blood work) drawn today and your tests are completely normal, you will receive your results only by: MyChart Message (if you have MyChart) OR A paper copy in the mail If you have any lab test that is abnormal or we need to change your treatment, we will call you to review the results.  Testing/Procedures: Your physician has requested that you have an echocardiogram. Echocardiography is a painless test that uses sound waves to create images of your heart. It provides your doctor with information about the size and shape of your heart and how well your heart's chambers and valves are working. This procedure takes approximately one hour. There are no restrictions for this procedure. Please do NOT wear cologne, perfume, aftershave, or lotions (deodorant is allowed). Please arrive 15 minutes prior to your appointment time.  Please note: We ask at that you not bring children with you during ultrasound (echo/ vascular) testing. Due to room size and safety concerns, children are not allowed in the ultrasound rooms during exams. Our front office staff cannot provide observation of children in our lobby area while testing is being conducted. An adult accompanying a patient to their appointment will only be allowed in the ultrasound room at the discretion of the ultrasound technician under special circumstances. We apologize for any inconvenience.   Follow-Up: At [facility], you and your health needs are our priority.  As part of our continuing mission to provide you with exceptional heart care, our providers are all part of one team.  This team includes your primary Cardiologist (physician) and Advanced Practice  Providers or APPs (Physician Assistants and Nurse Practitioners) who all work together to provide you with the care you need, when you need it.  Your next appointment:   3 month(s)  Provider:   One of our Advanced Practice Providers (APPs): Morse Clause, PA-C  Lamarr Satterfield, NP Miriam Shams, NP  Olivia Pavy, PA-C Josefa Beauvais, NP  Leontine Salen, PA-C Orren Fabry, PA-C  Hao Meng, PA-C Ernest Dick, NP  Damien Braver, NP Jon Hails, PA-C  Waddell Donath, PA-C    Dayna Dunn, PA-C  Scott Weaver, PA-C Lum Louis, NP Katlyn West, NP Callie Goodrich, PA-C  Xika Zhao, NP Sheng Haley, PA-C    Kathleen Johnson, PA-C       We recommend signing up for the patient portal called MyChart.  Sign up information is provided on this After Visit Summary.  MyChart is used to connect with patients for Virtual Visits (Telemedicine).  Patients are able to view lab/test results, encounter notes, upcoming appointments, etc.  Non-urgent messages can be sent to your provider as well.   To learn more about what you can do with MyChart, go to [email or web address].

## 2024-05-11 ENCOUNTER — Ambulatory Visit: Payer: Self-pay | Admitting: Cardiovascular Disease

## 2024-05-11 ENCOUNTER — Telehealth: Payer: Self-pay | Admitting: Cardiovascular Disease

## 2024-05-11 LAB — COMPREHENSIVE METABOLIC PANEL WITH GFR
ALT: 13 IU/L (ref 0–44)
AST: 14 IU/L (ref 0–40)
Albumin: 4.7 g/dL (ref 3.8–4.9)
Alkaline Phosphatase: 139 IU/L — AB (ref 47–123)
BUN/Creatinine Ratio: 15 (ref 10–24)
BUN: 22 mg/dL (ref 8–27)
Bilirubin Total: 0.4 mg/dL (ref 0.0–1.2)
CO2: 27 mmol/L (ref 20–29)
Calcium: 10.3 mg/dL — AB (ref 8.6–10.2)
Chloride: 95 mmol/L — AB (ref 96–106)
Creatinine, Ser: 1.51 mg/dL — AB (ref 0.76–1.27)
Globulin, Total: 4.1 g/dL (ref 1.5–4.5)
Glucose: 165 mg/dL — AB (ref 70–99)
Potassium: 3.7 mmol/L (ref 3.5–5.2)
Sodium: 140 mmol/L (ref 134–144)
Total Protein: 8.8 g/dL — AB (ref 6.0–8.5)
eGFR: 53 mL/min/1.73 — AB (ref >59)

## 2024-05-11 LAB — CBC
Hematocrit: 44.9 % (ref 37.5–51.0)
Hemoglobin: 14.3 g/dL (ref 13.0–17.7)
MCH: 29.2 pg (ref 26.6–33.0)
MCHC: 31.8 g/dL (ref 31.5–35.7)
MCV: 92 fL (ref 79–97)
Platelets: 349 x10E3/uL (ref 150–450)
RBC: 4.9 x10E6/uL (ref 4.14–5.80)
RDW: 14.4 % (ref 11.6–15.4)
WBC: 10.1 x10E3/uL (ref 3.4–10.8)

## 2024-05-11 NOTE — Telephone Encounter (Signed)
 Pt wife to follow up on results from yesterday's blood work 12/9. Please advise.

## 2024-05-11 NOTE — Telephone Encounter (Signed)
 Discussed lab results with patient, per Dr. Wonda: Kidney function improved. No major metabolic derangements. Blood counts in range.   Patient verbalized understanding and expressed appreciation for follow-up.

## 2024-05-16 ENCOUNTER — Encounter: Payer: Self-pay | Admitting: Bariatrics

## 2024-05-16 ENCOUNTER — Ambulatory Visit: Payer: PRIVATE HEALTH INSURANCE | Admitting: Bariatrics

## 2024-05-16 VITALS — BP 137/86 | HR 57 | Ht 71.0 in | Wt 322.0 lb

## 2024-05-16 DIAGNOSIS — E119 Type 2 diabetes mellitus without complications: Secondary | ICD-10-CM | POA: Diagnosis not present

## 2024-05-16 DIAGNOSIS — E66813 Obesity, class 3: Secondary | ICD-10-CM

## 2024-05-16 DIAGNOSIS — E1122 Type 2 diabetes mellitus with diabetic chronic kidney disease: Secondary | ICD-10-CM

## 2024-05-16 DIAGNOSIS — Z7985 Long-term (current) use of injectable non-insulin antidiabetic drugs: Secondary | ICD-10-CM

## 2024-05-16 DIAGNOSIS — I10 Essential (primary) hypertension: Secondary | ICD-10-CM

## 2024-05-16 DIAGNOSIS — Z6841 Body Mass Index (BMI) 40.0 and over, adult: Secondary | ICD-10-CM | POA: Diagnosis not present

## 2024-05-16 MED ORDER — TIRZEPATIDE 5 MG/0.5ML ~~LOC~~ SOAJ: 5.0000 mg | SUBCUTANEOUS | 0 refills | Status: DC

## 2024-05-16 MED ORDER — VITAMIN D (ERGOCALCIFEROL) 1.25 MG (50000 UNIT) PO CAPS: 50000.0000 [IU] | ORAL_CAPSULE | ORAL | 0 refills | Status: DC

## 2024-05-16 NOTE — Progress Notes (Signed)
 WEIGHT SUMMARY AND BIOMETRICS  Weight Lost Since Last Visit: 0lb  Weight Gained Since Last Visit: 2lb   Vitals BP: 137/86 Pulse Rate: (!) 57 SpO2: 96 %   Anthropometric Measurements Height: 5' 11 (1.803 m) Weight: (!) 322 lb (146.1 kg) BMI (Calculated): 44.93 Weight at Last Visit: 329lb Weight Lost Since Last Visit: 0lb Weight Gained Since Last Visit: 2lb Starting Weight: 326lb Total Weight Loss (lbs): 0 lb (0 kg)   No data recorded No data recorded  OBESITY Raymond [name] is here to discuss his progress with his obesity treatment plan along with follow-up of his obesity related diagnoses.    Nutrition Plan: the Category 4 plan - 80% adherence.  Current exercise: Chair exercises  Interim History:  He is up 2 lbs since his last visit.  Eating all of the food on the plan., Protein intake is as prescribed, and Water intake is adequate.   Pharmacotherapy: Fabiano is on Mounjaro  5.0 mg SQ weekly Adverse side effects: None Hunger is moderately controlled.  Cravings are moderately controlled.  Assessment/Plan:   Type II Diabetes HgbA1c is not at goal. Last A1c was 6.0 CBGs: Up 265 and no Mounjaro  for 1 month and has now resumed.    Episodes of hypoglycemia: no Medication(s): Mounjaro  5.0 mg SQ weekly  Lab Results  Component Value Date   HGBA1C 6.0 (H) 03/12/2023   HGBA1C 6.3 (H) 12/25/2022   Lab Results  Component Value Date   LDLCALC 77 05/13/2023   CREATININE 1.51 (H) 05/10/2024   Lab Results  Component Value Date   GFR 92.19 10/05/2014    Plan: Continue and refill Mounjaro  5.0 mg SQ weekly Continue all other medications.  Will keep all carbohydrates low both sweets and starches.  Will continue exercise regimen to 30 to 60 minutes on most days of the week.  Aim for 7 to 9 hours of sleep nightly.  Eat more low glycemic index foods.  Will get  more into a routine and will think about his eating plan daily. Will work on smaller portions. He will continue to keep his sodium down at about 1500 mg/day. He will continue to watch his carbohydrates and focus on healthy, nonstarchy carbs and lower sugar fruits.  Hypertension Hypertension stable.  Medication(s): Amlodipine  10 mg 1 daily  and Coreg  25 mg twice daily , Cardura  2 mg.   BP Readings from Last 3 Encounters:  05/16/24 137/86  05/10/24 (!) 170/90  04/27/24 (!) 182/77   Lab Results  Component Value Date   CREATININE 1.51 (H) 05/10/2024   CREATININE 1.74 (H) 03/30/2024   CREATININE 2.02 (H) 03/23/2024   Lab Results  Component Value Date   GFR 92.19 10/05/2014    Plan: Continue all antihypertensives at current dosages. No added salt. Will keep sodium content to 1,500 mg or less per day.     Morbid Obesity: Current  BMI BMI (Calculated): 44.93   Pharmacotherapy Plan Continue and refill  Mounjaro  5.0 mg SQ weekly  Emanuelle is currently in the action stage of change. As such, his goal is to continue with weight loss efforts.  He has agreed to the Category 4 plan.  Exercise goals: All adults should avoid inactivity. Some physical activity is better than none, and adults who participate in any amount of physical activity gain some health benefits. Will continue his PT.  Behavioral modification strategies: increasing lean protein intake, no meal skipping, meal planning , planning for success, increasing vegetables, decrease snacking , avoiding temptations, weigh protein portions, work on smaller portions, pack lunch for work, and mindful eating.  Jestin has agreed to follow-up with our clinic in 4 weeks.    Objective:   VITALS: Per patient if applicable, see vitals. GENERAL: Alert and in no acute distress. CARDIOPULMONARY: No increased WOB. Speaking in clear sentences.  PSYCH: Pleasant and cooperative. Speech normal rate and rhythm. Affect is appropriate. Insight and  judgement are appropriate. Attention is focused, linear, and appropriate.  NEURO: Oriented as arrived to appointment on time with no prompting.   Attestation Statements:   This was prepared with the assistance of Engineer, Civil (consulting).  Occasional wrong-word or sound-a-like substitutions Dome have occurred due to the inherent limitations of voice recognition   Clayborne Daring, DO

## 2024-05-19 ENCOUNTER — Encounter (HOSPITAL_COMMUNITY): Payer: Self-pay

## 2024-05-19 ENCOUNTER — Emergency Department (HOSPITAL_COMMUNITY): Payer: PRIVATE HEALTH INSURANCE

## 2024-05-19 ENCOUNTER — Other Ambulatory Visit: Payer: Self-pay

## 2024-05-19 ENCOUNTER — Inpatient Hospital Stay (HOSPITAL_COMMUNITY)
Admission: EM | Admit: 2024-05-19 | Discharge: 2024-05-23 | DRG: 441 | Disposition: A | Discharge status: Home / Self Care | Payer: PRIVATE HEALTH INSURANCE | Attending: Internal Medicine | Admitting: Internal Medicine

## 2024-05-19 DIAGNOSIS — Z781 Physical restraint status: Secondary | ICD-10-CM

## 2024-05-19 DIAGNOSIS — E785 Hyperlipidemia, unspecified: Secondary | ICD-10-CM | POA: Diagnosis present

## 2024-05-19 DIAGNOSIS — R9431 Abnormal electrocardiogram [ECG] [EKG]: Secondary | ICD-10-CM | POA: Diagnosis present

## 2024-05-19 DIAGNOSIS — E1122 Type 2 diabetes mellitus with diabetic chronic kidney disease: Secondary | ICD-10-CM | POA: Diagnosis present

## 2024-05-19 DIAGNOSIS — N1832 Chronic kidney disease, stage 3b: Secondary | ICD-10-CM | POA: Diagnosis present

## 2024-05-19 DIAGNOSIS — J449 Chronic obstructive pulmonary disease, unspecified: Secondary | ICD-10-CM | POA: Diagnosis present

## 2024-05-19 DIAGNOSIS — I5032 Chronic diastolic (congestive) heart failure: Secondary | ICD-10-CM | POA: Diagnosis present

## 2024-05-19 DIAGNOSIS — E876 Hypokalemia: Secondary | ICD-10-CM | POA: Diagnosis present

## 2024-05-19 DIAGNOSIS — Z833 Family history of diabetes mellitus: Secondary | ICD-10-CM

## 2024-05-19 DIAGNOSIS — Z87891 Personal history of nicotine dependence: Secondary | ICD-10-CM

## 2024-05-19 DIAGNOSIS — G2581 Restless legs syndrome: Secondary | ICD-10-CM | POA: Diagnosis present

## 2024-05-19 DIAGNOSIS — Z87440 Personal history of urinary (tract) infections: Secondary | ICD-10-CM

## 2024-05-19 DIAGNOSIS — R0902 Hypoxemia: Secondary | ICD-10-CM | POA: Diagnosis present

## 2024-05-19 DIAGNOSIS — K7682 Hepatic encephalopathy: Principal | ICD-10-CM | POA: Diagnosis present

## 2024-05-19 DIAGNOSIS — N401 Enlarged prostate with lower urinary tract symptoms: Secondary | ICD-10-CM | POA: Diagnosis present

## 2024-05-19 DIAGNOSIS — F419 Anxiety disorder, unspecified: Secondary | ICD-10-CM | POA: Diagnosis present

## 2024-05-19 DIAGNOSIS — Z7982 Long term (current) use of aspirin: Secondary | ICD-10-CM

## 2024-05-19 DIAGNOSIS — E66813 Obesity, class 3: Secondary | ICD-10-CM | POA: Diagnosis present

## 2024-05-19 DIAGNOSIS — I130 Hypertensive heart and chronic kidney disease with heart failure and stage 1 through stage 4 chronic kidney disease, or unspecified chronic kidney disease: Secondary | ICD-10-CM | POA: Diagnosis present

## 2024-05-19 DIAGNOSIS — M79671 Pain in right foot: Secondary | ICD-10-CM | POA: Diagnosis present

## 2024-05-19 DIAGNOSIS — Z83438 Family history of other disorder of lipoprotein metabolism and other lipidemia: Secondary | ICD-10-CM

## 2024-05-19 DIAGNOSIS — Z1152 Encounter for screening for COVID-19: Secondary | ICD-10-CM

## 2024-05-19 DIAGNOSIS — Z7985 Long-term (current) use of injectable non-insulin antidiabetic drugs: Secondary | ICD-10-CM

## 2024-05-19 DIAGNOSIS — Z8249 Family history of ischemic heart disease and other diseases of the circulatory system: Secondary | ICD-10-CM

## 2024-05-19 DIAGNOSIS — Z7401 Bed confinement status: Secondary | ICD-10-CM

## 2024-05-19 DIAGNOSIS — G9341 Metabolic encephalopathy: Secondary | ICD-10-CM | POA: Diagnosis present

## 2024-05-19 DIAGNOSIS — Z818 Family history of other mental and behavioral disorders: Secondary | ICD-10-CM

## 2024-05-19 DIAGNOSIS — R4182 Altered mental status, unspecified: Principal | ICD-10-CM

## 2024-05-19 DIAGNOSIS — F32A Depression, unspecified: Secondary | ICD-10-CM | POA: Diagnosis present

## 2024-05-19 DIAGNOSIS — E119 Type 2 diabetes mellitus without complications: Secondary | ICD-10-CM

## 2024-05-19 DIAGNOSIS — I160 Hypertensive urgency: Secondary | ICD-10-CM | POA: Diagnosis present

## 2024-05-19 DIAGNOSIS — F1911 Other psychoactive substance abuse, in remission: Secondary | ICD-10-CM | POA: Diagnosis present

## 2024-05-19 DIAGNOSIS — Z6841 Body Mass Index (BMI) 40.0 and over, adult: Secondary | ICD-10-CM

## 2024-05-19 LAB — CBC WITH DIFFERENTIAL/PLATELET
Abs Immature Granulocytes: 0.08 K/uL — ABNORMAL HIGH (ref 0.00–0.07)
Basophils Absolute: 0 K/uL (ref 0.0–0.1)
Basophils Relative: 0 %
Eosinophils Absolute: 0.1 K/uL (ref 0.0–0.5)
Eosinophils Relative: 1 %
HCT: 37.8 % — ABNORMAL LOW (ref 39.0–52.0)
Hemoglobin: 12.4 g/dL — ABNORMAL LOW (ref 13.0–17.0)
Immature Granulocytes: 1 %
Lymphocytes Relative: 11 %
Lymphs Abs: 1.8 K/uL (ref 0.7–4.0)
MCH: 29.4 pg (ref 26.0–34.0)
MCHC: 32.8 g/dL (ref 30.0–36.0)
MCV: 89.6 fL (ref 80.0–100.0)
Monocytes Absolute: 1.2 K/uL — ABNORMAL HIGH (ref 0.1–1.0)
Monocytes Relative: 8 %
Neutro Abs: 12.6 K/uL — ABNORMAL HIGH (ref 1.7–7.7)
Neutrophils Relative %: 79 %
Platelets: 342 K/uL (ref 150–400)
RBC: 4.22 MIL/uL (ref 4.22–5.81)
RDW: 15.6 % — ABNORMAL HIGH (ref 11.5–15.5)
WBC: 15.8 K/uL — ABNORMAL HIGH (ref 4.0–10.5)
nRBC: 0 % (ref 0.0–0.2)

## 2024-05-19 LAB — COMPREHENSIVE METABOLIC PANEL WITH GFR
ALT: 17 U/L (ref 0–44)
AST: 17 U/L (ref 15–41)
Albumin: 4.2 g/dL (ref 3.5–5.0)
Alkaline Phosphatase: 111 U/L (ref 38–126)
Anion gap: 11 (ref 5–15)
BUN: 39 mg/dL — ABNORMAL HIGH (ref 6–20)
CO2: 31 mmol/L (ref 22–32)
Calcium: 10 mg/dL (ref 8.9–10.3)
Chloride: 100 mmol/L (ref 98–111)
Creatinine, Ser: 1.72 mg/dL — ABNORMAL HIGH (ref 0.61–1.24)
GFR, Estimated: 45 mL/min — ABNORMAL LOW (ref >60)
Glucose, Bld: 136 mg/dL — ABNORMAL HIGH (ref 70–99)
Potassium: 3.1 mmol/L — ABNORMAL LOW (ref 3.5–5.1)
Sodium: 142 mmol/L (ref 135–145)
Total Bilirubin: 0.4 mg/dL (ref 0.0–1.2)
Total Protein: 8.1 g/dL (ref 6.5–8.1)

## 2024-05-19 LAB — I-STAT CG4 LACTIC ACID, ED: Lactic Acid, Venous: 1 mmol/L (ref 0.5–1.9)

## 2024-05-19 MED ORDER — SODIUM CHLORIDE 0.9 % IV SOLN
2.0000 g | Freq: Once | INTRAVENOUS | Status: AC
  Administered 2024-05-19: 2 g via INTRAVENOUS
  Filled 2024-05-19: qty 20

## 2024-05-19 MED ORDER — SODIUM CHLORIDE 0.9 % IV SOLN: 1.0000 g | Freq: Once | INTRAVENOUS | Status: DC

## 2024-05-19 MED ORDER — SODIUM CHLORIDE 0.9 % IV BOLUS
500.0000 mL | Freq: Once | INTRAVENOUS | Status: AC
  Administered 2024-05-19: 500 mL via INTRAVENOUS

## 2024-05-19 MED ADMIN — Lorazepam Inj 2 MG/ML: 2 mg | INTRAVENOUS | NDC 65219036801

## 2024-05-19 MED FILL — Lorazepam Inj 2 MG/ML: 2.0000 mg | INTRAMUSCULAR | Qty: 1 | Status: AC

## 2024-05-19 NOTE — ED Triage Notes (Signed)
 Pt. Bib [facility] for AMS pt. Became altered at around 7 am. Yesterday pt. Was at baseline. He is UTI prone. Pt. Met EMS sepsis criteria. Pt. Is warm to touch and able to respond to verbal stimuli. Denies pain. A&ox2. Restrained by ems on arrival. Given 5mg  of midazolam  IV by EMS, 500ml NS.

## 2024-05-19 NOTE — ED Provider Notes (Signed)
 " [facility] AT [facility] Provider Note   CSN: [id number] Arrival date & time: 05/19/24  2136     History  Chief Complaint  Patient presents with   Altered Mental Status    Raymond [name] is a 58 y.o. male with PMH as listed below who presents [facility] for AMS pt. Became altered at around 7 am. Nilsa reportedly was at baseline. He is UTI prone. Pt. Met EMS sepsis criteria. Given 5mg  of midazolam  IV by EMS for confusion/agitation, 500ml NS. Pt. Is warm to touch and able to respond to verbal stimuli. Denies pain. On my assessment he is restrained in four point restraints, A&Ox2 but writhing around, states he doesn't know what's going on, intermittently thrashing all extremities and yelling out oh god! When asked what is wrong he states he doesn't know.  .  Per chart review he had reported somnolence/hypersensitivity to haldol  given at one poin and has hypersensitivity in allergies listed to haldol .  Past Medical History:  Diagnosis Date   Acute kidney injury superimposed on chronic kidney disease 04/13/2023   Acute on chronic anemia 04/13/2023   Anemia    Anxiety    Back pain    COPD (chronic obstructive pulmonary disease) (HCC)    Depression    Diabetes mellitus without complication (HCC)    Diverticulosis    with hx of LGIB   Edema    GERD (gastroesophageal reflux disease)    GIB (gastrointestinal bleeding)    Gout    Heavy smoker    Hyperlipidemia    Hypertension    Iron deficiency anemia    Sleep apnea    uses a cpap   Swelling of lower extremity        Home Medications Prior to Admission medications  Medication Sig Start Date End Date Taking? Authorizing Provider  albuterol  (VENTOLIN  HFA) 108 (90 Base) MCG/ACT inhaler Inhale 2 puffs into the lungs every 6 (six) hours as needed for wheezing or shortness of breath. 05/12/23   Raulkar, Sven SQUIBB, MD  allopurinol  (ZYLOPRIM ) 300 MG tablet Take 1 tablet (300 mg total) by mouth daily.  05/12/23   Raulkar, Sven SQUIBB, MD  ALPRAZolam  (XANAX ) 0.5 MG tablet Take 1 tablet (0.5 mg total) by mouth 2 (two) times daily as needed. 05/12/23   Raulkar, Sven SQUIBB, MD  amLODipine  (NORVASC ) 10 MG tablet Take 10 mg by mouth daily. 07/20/23   [provider]  aspirin  EC 81 MG tablet Take 81 mg by mouth daily. Swallow whole.    [provider]  atorvastatin  (LIPITOR) 40 MG tablet Take 1 tablet (40 mg total) by mouth every evening. 05/12/23   Raulkar, Sven SQUIBB, MD  baclofen  (LIORESAL ) 20 MG tablet Take 1 tablet (20 mg total) by mouth 3 (three) times daily. 04/27/24   Lovorn, Megan, MD  carvedilol  (COREG ) 25 MG tablet Take 1 tablet (25 mg total) by mouth in the morning and at bedtime. 05/12/23   Raulkar, Sven SQUIBB, MD  diclofenac  Sodium (VOLTAREN ) 1 % GEL Apply 4 g topically 4 (four) times daily. To bilateral knees 05/12/23   Raulkar, Sven SQUIBB, MD  diphenhydrAMINE  (BENADRYL ) 25 mg capsule Take 25 mg by mouth as needed. Patient not taking: Reported on 05/16/2024    [provider]  doxazosin  (CARDURA ) 2 MG tablet Take 1 tablet (2 mg total) by mouth at bedtime. Patient taking differently: Take 2 mg by mouth 2 (two) times daily. 05/12/23   Lorilee Sven SQUIBB, MD  DULoxetine  (CYMBALTA ) 30 MG capsule Take 1 capsule by mouth once daily Patient taking differently: Take 60 mg by mouth daily. 06/24/23   Raulkar, Sven SQUIBB, MD  DULoxetine  (CYMBALTA ) 60 MG capsule Take 60 mg by mouth daily. Patient not taking: Reported on 05/16/2024    [provider]  furosemide  (LASIX ) 80 MG tablet Take 80 mg by mouth 2 (two) times daily. Patient not taking: Reported on 05/16/2024    [provider]  hydrALAZINE  (APRESOLINE ) 25 MG tablet Take 25 mg by mouth 3 (three) times daily.    [provider]  lidocaine  (LIDODERM ) 5 % Place 2 patches onto the skin daily at 10 pm. Remove & Discard patch within 12 hours or as directed by MD 05/12/23   Raulkar, Sven SQUIBB, MD   metolazone (ZAROXOLYN) 2.5 MG tablet Take 2.5 mg by mouth daily. Patient not taking: Reported on 05/16/2024    [provider]  omeprazole (PRILOSEC) 40 MG capsule Take 40 mg by mouth daily. 07/02/23   [provider]  ondansetron  (ZOFRAN ) 4 MG tablet Take 1 tablet (4 mg total) by mouth every 8 (eight) hours as needed for nausea or vomiting. 04/18/24   Delores Shields A, DO  oxyCODONE -acetaminophen  (PERCOCET) 10-325 MG tablet Take 1 tablet by mouth every 6 (six) hours as needed for pain. 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 03/23/24   Yolande Lamar BROCKS, MD  pramipexole  (MIRAPEX ) 0.5 MG tablet Take 1 tablet (0.5 mg total) by mouth in the morning and at bedtime. 05/12/23   Raulkar, Sven SQUIBB, MD  promethazine  (PHENERGAN ) 12.5 MG tablet Take 1 tablet (12.5 mg total) by mouth every 6 (six) hours as needed for refractory nausea / vomiting. 04/27/24   Lovorn, Megan, MD  tamsulosin  (FLOMAX ) 0.4 MG CAPS capsule Take 1 capsule (0.4 mg total) by mouth at bedtime. 04/09/23   Love, Sharlet RAMAN, PA-C  tirzepatide  (MOUNJARO ) 5 MG/0.5ML Pen Inject 5 mg into the skin once a week. 05/16/24   Delores Shields A, DO  tiZANidine  (ZANAFLEX ) 4 MG tablet Take 1 tablet (4 mg total) by mouth 3 (three) times daily. - for spasticity with baclofen  04/27/24   Lovorn, Megan, MD  topiramate  (TOPAMAX ) 25 MG tablet Take 1 tablet (25 mg total) by mouth 2 (two) times daily. 05/12/23   Raulkar, Sven SQUIBB, MD  Vitamin D , Ergocalciferol , (DRISDOL ) 1.25 MG (50000 UNIT) CAPS capsule Take 1 capsule (50,000 Units total) by mouth every 7 (seven) days. 05/16/24   Delores Shields LABOR, DO      Allergies    Haldol  [haloperidol ], Morphine , and Sulfa  antibiotics    Review of Systems   Review of Systems A 10 point review of systems was performed and is negative unless otherwise reported in HPI.  Physical  Exam Updated Vital Signs BP (!) 164/73   Pulse 72   Temp 97.7 F (36.5 C) (Oral)   Resp 18   Ht 5' 11 (1.803 m)   Wt (!) 139 kg   SpO2 98%   BMI 42.74 kg/m  Physical Exam General: Acutely agitated elderly male in 4-point restraints in the bed HEENT: NCAT, PERRLA, EOMI, sclera anicteric, MMM, trachea midline.  Cardiology: RRR.  Resp: Normal respiratory rate and effort. CTAB, no wheezes, rhonchi, crackles.  Abd: Soft, non-tender, non-distended. No rebound tenderness or guarding.  GU: Deferred. MSK: No peripheral edema or  signs of trauma. Extremities without deformity or TTP. No cyanosis or clubbing. Skin: warm, dry. No rashes or lesions. Back: No CVA tenderness Neuro: A&Ox1, CNs II-XII grossly intact. MAEs. Sensation grossly intact.  Psych: Acutely agitated, repeating oh god!  ED Results / Procedures / Treatments   Labs (all labs ordered are listed, but only abnormal results are displayed) Labs Reviewed  COMPREHENSIVE METABOLIC PANEL WITH GFR - Abnormal; Notable for the following components:      Result Value   Potassium 3.1 (*)    Glucose, Bld 136 (*)    BUN 39 (*)    Creatinine, Ser 1.72 (*)    GFR, Estimated 45 (*)    All other components within normal limits  CBC WITH DIFFERENTIAL/PLATELET - Abnormal; Notable for the following components:   WBC 15.8 (*)    Hemoglobin 12.4 (*)    HCT 37.8 (*)    RDW 15.6 (*)    Neutro Abs 12.6 (*)    Monocytes Absolute 1.2 (*)    Abs Immature Granulocytes 0.08 (*)    All other components within normal limits  URINALYSIS, W/ REFLEX TO CULTURE (INFECTION SUSPECTED) - Abnormal; Notable for the following components:   Color, Urine STRAW (*)    Protein, ur 100 (*)    All other components within normal limits  BLOOD GAS, VENOUS - Abnormal; Notable for the following components:   pH, Ven 7.48 (*)    Bicarbonate 33.5 (*)    Acid-Base Excess 8.9 (*)    All other components within normal limits  URINE DRUG SCREEN - Abnormal; Notable  for the following components:   Benzodiazepines POSITIVE (*)    All other components within normal limits  CULTURE, BLOOD (ROUTINE X 2)  CULTURE, BLOOD (ROUTINE X 2)  RESP PANEL BY RT-PCR (RSV, FLU A&B, COVID)  RVPGX2  PROTIME-INR  AMMONIA  ETHANOL  TSH  I-STAT CG4 LACTIC ACID, ED  I-STAT CG4 LACTIC ACID, ED    EKG EKG Interpretation Date/Time:  Friday May 20 2024 00:36:31 EST Ventricular Rate:  102 PR Interval:  178 QRS Duration:  109 QT Interval:  368 QTC Calculation: 480 R Axis:   76  Text Interpretation: Fast sinus arrhythmia Low voltage, precordial leads Anteroseptal infarct, old Confirmed by Mannie Pac [phone]) on 05/20/2024 12:43:29 AM  Radiology CXR: 1. No acute findings. 2. Stable cardiomegaly.  CTH: pending  Procedures .Critical Care  Performed by: Franklyn Sid SAILOR, MD Authorized by: Franklyn Sid SAILOR, MD   Critical care provider statement:    Critical care time (minutes):  30   Critical care was necessary to treat or prevent imminent or life-threatening deterioration of the following conditions:  CNS failure or compromise   Critical care was time spent personally by me on the following activities:  Development of treatment plan with patient or surrogate, evaluation of patient's response to treatment, examination of patient, ordering and review of radiographic studies, ordering and performing treatments and interventions, pulse oximetry, re-evaluation of patient's condition, review of old charts and obtaining history from patient or surrogate     Medications Ordered in ED Medications  sodium chloride  0.9 % bolus 500 mL (0 mLs Intravenous Stopped 05/20/24 0050)  LORazepam  (ATIVAN ) injection 2 mg (2 mg Intravenous Given 05/19/24 2344)  cefTRIAXone  (ROCEPHIN ) 2 g in sodium chloride  0.9 % 100 mL IVPB (0 g Intravenous Stopped 05/20/24 0050)  ziprasidone  (GEODON ) injection 10 mg (10 mg Intramuscular Given 05/20/24 0015)  sterile water  (preservative free)  injection (  Given 05/20/24  FORD.[email or web address])    ED Course/ Medical Decision Making/ A&P                          Medical Decision Making Amount and/or Complexity of Data Reviewed Labs: ordered. Radiology: ordered. Decision-making details documented in ED Course.  Risk Prescription drug management. Decision regarding hospitalization.    This patient presents to the ED for concern of AMS/agitation, this involves an extensive number of treatment options, and is a complaint that carries with it a high risk of complications and morbidity.  I considered the following differential and admission for this acute, potentially life threatening condition.   MDM:    Ddx of acute altered mental status or encephalopathy considered but not limited to: -Intracranial abnormalities such as ICH, hydrocephalus, head trauma - will need CTH but patient is very agitated. Already received 5 mg midazolam  from EMS. Will give additional ativan  IV.  -Infection such as UTI, PNA. Doubt meningitis, no nuchal rigidity -Toxic ingestion or polypharmacy -Electrolyte abnormalities or hyper/hypoglycemia -Hypercarbia or hypoxia -Hepatic encephalopathy or uremia -EKG w/ no signs of ischemia or arrhythmia -Pt is hypertensive into 180s on arrival though he is very agitated and will obtain more blood pressure when he is not agitated  Signed out pending labs/imaging.   Clinical Course as of 05/26/24 1200  Thu May 19, 2024  2227 Patient restrained but thrashing about. Will give 2 mg IV ativan . [HN]  2255 DG Chest Port 1 View if patient is in a treatment room. 1. No acute findings. 2. Stable cardiomegaly.   [HN]    Clinical Course User Index [HN] Franklyn Sid SAILOR, MD    Labs: I Ordered, and personally interpreted labs.  The pertinent results include:  those listeda bove  Imaging Studies ordered: I ordered imaging studies including CXR, CTH I independently visualized and interpreted imaging. I agree with the radiologist  interpretation  Additional history obtained from chart review, EMS.    Cardiac Monitoring: The patient was maintained on a cardiac monitor.  I personally viewed and interpreted the cardiac monitored which showed an underlying rhythm of: sinus tachycardia  Reevaluation: After the interventions noted above, I reevaluated the patient and found that they have :improved  Social Determinants of Health: Unknown  Disposition:  Patient is signed out to the oncoming ED physician Dr. Mannie who is made aware of her history, presentation, exam, workup, and plan.     Co morbidities that complicate the patient evaluation  Past Medical History:  Diagnosis Date   Acute kidney injury superimposed on chronic kidney disease 04/13/2023   Acute on chronic anemia 04/13/2023   Anemia    Anxiety    Back pain    COPD (chronic obstructive pulmonary disease) (HCC)    Depression    Diabetes mellitus without complication (HCC)    Diverticulosis    with hx of LGIB   Edema    GERD (gastroesophageal reflux disease)    GIB (gastrointestinal bleeding)    Gout    Heavy smoker    Hyperlipidemia    Hypertension    Iron deficiency anemia    Sleep apnea    uses a cpap   Swelling of lower extremity      Medicines No orders of the defined types were placed in this encounter.   I have reviewed the patients home medicines and have made adjustments as needed  Problem List / ED Course: Problem List Items Addressed This Visit   None Visit  Diagnoses       Altered mental status, unspecified altered mental status type    -  Primary                   This note was created using dictation software, which Oberman contain spelling or grammatical errors.    Franklyn Sid SAILOR, MD 05/26/24 1216  "

## 2024-05-20 ENCOUNTER — Emergency Department (HOSPITAL_COMMUNITY): Payer: PRIVATE HEALTH INSURANCE

## 2024-05-20 ENCOUNTER — Ambulatory Visit: Admitting: Physical Medicine and Rehabilitation

## 2024-05-20 DIAGNOSIS — G9341 Metabolic encephalopathy: Secondary | ICD-10-CM | POA: Diagnosis present

## 2024-05-20 DIAGNOSIS — K7682 Hepatic encephalopathy: Secondary | ICD-10-CM | POA: Diagnosis present

## 2024-05-20 DIAGNOSIS — Z7982 Long term (current) use of aspirin: Secondary | ICD-10-CM | POA: Diagnosis not present

## 2024-05-20 DIAGNOSIS — E785 Hyperlipidemia, unspecified: Secondary | ICD-10-CM | POA: Diagnosis present

## 2024-05-20 DIAGNOSIS — Z1152 Encounter for screening for COVID-19: Secondary | ICD-10-CM | POA: Diagnosis not present

## 2024-05-20 DIAGNOSIS — I5032 Chronic diastolic (congestive) heart failure: Secondary | ICD-10-CM | POA: Diagnosis present

## 2024-05-20 DIAGNOSIS — I160 Hypertensive urgency: Secondary | ICD-10-CM | POA: Diagnosis present

## 2024-05-20 DIAGNOSIS — Z7401 Bed confinement status: Secondary | ICD-10-CM | POA: Diagnosis not present

## 2024-05-20 DIAGNOSIS — Z87891 Personal history of nicotine dependence: Secondary | ICD-10-CM | POA: Diagnosis not present

## 2024-05-20 DIAGNOSIS — N1832 Chronic kidney disease, stage 3b: Secondary | ICD-10-CM | POA: Diagnosis present

## 2024-05-20 DIAGNOSIS — F419 Anxiety disorder, unspecified: Secondary | ICD-10-CM | POA: Diagnosis present

## 2024-05-20 DIAGNOSIS — E876 Hypokalemia: Secondary | ICD-10-CM | POA: Diagnosis present

## 2024-05-20 DIAGNOSIS — I130 Hypertensive heart and chronic kidney disease with heart failure and stage 1 through stage 4 chronic kidney disease, or unspecified chronic kidney disease: Secondary | ICD-10-CM | POA: Diagnosis present

## 2024-05-20 DIAGNOSIS — Z781 Physical restraint status: Secondary | ICD-10-CM | POA: Diagnosis not present

## 2024-05-20 DIAGNOSIS — E66813 Obesity, class 3: Secondary | ICD-10-CM | POA: Diagnosis present

## 2024-05-20 DIAGNOSIS — N401 Enlarged prostate with lower urinary tract symptoms: Secondary | ICD-10-CM | POA: Diagnosis present

## 2024-05-20 DIAGNOSIS — Z7985 Long-term (current) use of injectable non-insulin antidiabetic drugs: Secondary | ICD-10-CM | POA: Diagnosis not present

## 2024-05-20 DIAGNOSIS — R4182 Altered mental status, unspecified: Secondary | ICD-10-CM | POA: Diagnosis present

## 2024-05-20 DIAGNOSIS — E1122 Type 2 diabetes mellitus with diabetic chronic kidney disease: Secondary | ICD-10-CM | POA: Diagnosis present

## 2024-05-20 DIAGNOSIS — F32A Depression, unspecified: Secondary | ICD-10-CM | POA: Diagnosis present

## 2024-05-20 DIAGNOSIS — R0902 Hypoxemia: Secondary | ICD-10-CM | POA: Diagnosis present

## 2024-05-20 DIAGNOSIS — M79671 Pain in right foot: Secondary | ICD-10-CM | POA: Diagnosis not present

## 2024-05-20 DIAGNOSIS — R9431 Abnormal electrocardiogram [ECG] [EKG]: Secondary | ICD-10-CM | POA: Insufficient documentation

## 2024-05-20 DIAGNOSIS — G2581 Restless legs syndrome: Secondary | ICD-10-CM | POA: Diagnosis present

## 2024-05-20 DIAGNOSIS — Z6841 Body Mass Index (BMI) 40.0 and over, adult: Secondary | ICD-10-CM | POA: Diagnosis not present

## 2024-05-20 DIAGNOSIS — J449 Chronic obstructive pulmonary disease, unspecified: Secondary | ICD-10-CM | POA: Diagnosis present

## 2024-05-20 DIAGNOSIS — Z8249 Family history of ischemic heart disease and other diseases of the circulatory system: Secondary | ICD-10-CM | POA: Diagnosis not present

## 2024-05-20 LAB — PROTIME-INR
INR: 1 (ref 0.8–1.2)
Prothrombin Time: 14.2 s (ref 11.4–15.2)

## 2024-05-20 LAB — RESP PANEL BY RT-PCR (RSV, FLU A&B, COVID)  RVPGX2
Influenza A by PCR: NEGATIVE
Influenza B by PCR: NEGATIVE
Resp Syncytial Virus by PCR: NEGATIVE
SARS Coronavirus 2 by RT PCR: NEGATIVE

## 2024-05-20 LAB — CBC
HCT: 38.2 % — ABNORMAL LOW (ref 39.0–52.0)
Hemoglobin: 12.4 g/dL — ABNORMAL LOW (ref 13.0–17.0)
MCH: 29.3 pg (ref 26.0–34.0)
MCHC: 32.5 g/dL (ref 30.0–36.0)
MCV: 90.3 fL (ref 80.0–100.0)
Platelets: 295 K/uL (ref 150–400)
RBC: 4.23 MIL/uL (ref 4.22–5.81)
RDW: 15.9 % — ABNORMAL HIGH (ref 11.5–15.5)
WBC: 11.8 K/uL — ABNORMAL HIGH (ref 4.0–10.5)
nRBC: 0 % (ref 0.0–0.2)

## 2024-05-20 LAB — URINALYSIS, W/ REFLEX TO CULTURE (INFECTION SUSPECTED)
Bacteria, UA: NONE SEEN
Bilirubin Urine: NEGATIVE
Glucose, UA: NEGATIVE mg/dL
Hgb urine dipstick: NEGATIVE
Ketones, ur: NEGATIVE mg/dL
Leukocytes,Ua: NEGATIVE
Nitrite: NEGATIVE
Protein, ur: 100 mg/dL — AB
Specific Gravity, Urine: 1.01 (ref 1.005–1.030)
pH: 5 (ref 5.0–8.0)

## 2024-05-20 LAB — SYPHILIS: RPR W/REFLEX TO RPR TITER AND TREPONEMAL ANTIBODIES, TRADITIONAL SCREENING AND DIAGNOSIS ALGORITHM: RPR Ser Ql: NONREACTIVE

## 2024-05-20 LAB — BLOOD GAS, VENOUS
Acid-Base Excess: 8.9 mmol/L — ABNORMAL HIGH (ref 0.0–2.0)
Bicarbonate: 33.5 mmol/L — ABNORMAL HIGH (ref 20.0–28.0)
Drawn by: 75195
O2 Saturation: 75.8 %
Patient temperature: 37
pCO2, Ven: 45 mmHg (ref 44–60)
pH, Ven: 7.48 — ABNORMAL HIGH (ref 7.25–7.43)
pO2, Ven: 42 mmHg (ref 32–45)

## 2024-05-20 LAB — URINE DRUG SCREEN
Amphetamines: NEGATIVE
Barbiturates: NEGATIVE
Benzodiazepines: POSITIVE — AB
Cocaine: NEGATIVE
Fentanyl: NEGATIVE
Methadone Scn, Ur: NEGATIVE
Opiates: NEGATIVE
Tetrahydrocannabinol: NEGATIVE

## 2024-05-20 LAB — AMMONIA: Ammonia: 19 umol/L (ref 9–35)

## 2024-05-20 LAB — CREATININE, SERUM
Creatinine, Ser: 1.57 mg/dL — ABNORMAL HIGH (ref 0.61–1.24)
GFR, Estimated: 50 mL/min — ABNORMAL LOW

## 2024-05-20 LAB — ETHANOL: Alcohol, Ethyl (B): <15 mg/dL

## 2024-05-20 LAB — TSH: TSH: 0.703 u[IU]/mL (ref 0.350–4.500)

## 2024-05-20 LAB — HIV ANTIBODY (ROUTINE TESTING W REFLEX): HIV Screen 4th Generation wRfx: NONREACTIVE

## 2024-05-20 MED ORDER — ACETAMINOPHEN 325 MG PO TABS
650.0000 mg | ORAL_TABLET | Freq: Four times a day (QID) | ORAL | Status: DC | PRN
  Administered 2024-05-20 – 2024-05-22 (×4): 650 mg via ORAL
  Filled 2024-05-20 (×5): qty 2

## 2024-05-20 MED ORDER — NYSTATIN 100000 UNIT/GM EX POWD
Freq: Three times a day (TID) | CUTANEOUS | Status: DC
  Filled 2024-05-20 (×2): qty 15

## 2024-05-20 MED ORDER — STERILE WATER FOR INJECTION IJ SOLN
INTRAMUSCULAR | Status: AC
  Filled 2024-05-20: qty 10

## 2024-05-20 MED ORDER — VANCOMYCIN HCL 1750 MG/350ML IV SOLN: 1750.0000 mg | INTRAVENOUS | Status: DC

## 2024-05-20 MED ORDER — HYDRALAZINE HCL 25 MG PO TABS
25.0000 mg | ORAL_TABLET | Freq: Three times a day (TID) | ORAL | Status: DC
  Administered 2024-05-20 – 2024-05-22 (×8): 25 mg via ORAL
  Filled 2024-05-20 (×8): qty 1

## 2024-05-20 MED ORDER — TAMSULOSIN HCL 0.4 MG PO CAPS
0.4000 mg | ORAL_CAPSULE | Freq: Every day | ORAL | Status: DC
  Administered 2024-05-20 – 2024-05-22 (×3): 0.4 mg via ORAL
  Filled 2024-05-20 (×3): qty 1

## 2024-05-20 MED ORDER — ZIPRASIDONE MESYLATE 20 MG IM SOLR
10.0000 mg | Freq: Once | INTRAMUSCULAR | Status: AC
  Administered 2024-05-20: 10 mg via INTRAMUSCULAR
  Filled 2024-05-20: qty 20

## 2024-05-20 MED ORDER — ENOXAPARIN SODIUM 80 MG/0.8ML IJ SOSY
70.0000 mg | PREFILLED_SYRINGE | INTRAMUSCULAR | Status: DC
  Administered 2024-05-20 – 2024-05-23 (×4): 70 mg via SUBCUTANEOUS
  Filled 2024-05-20 (×2): qty 0.8
  Filled 2024-05-20: qty 0.7
  Filled 2024-05-20: qty 0.8

## 2024-05-20 MED ORDER — PROCHLORPERAZINE EDISYLATE 10 MG/2ML IJ SOLN: 5.0000 mg | Freq: Four times a day (QID) | INTRAMUSCULAR | Status: DC | PRN

## 2024-05-20 MED ORDER — ADULT MULTIVITAMIN W/MINERALS CH
1.0000 | ORAL_TABLET | Freq: Every day | ORAL | Status: DC
  Administered 2024-05-20 – 2024-05-23 (×4): 1 via ORAL
  Filled 2024-05-20 (×4): qty 1

## 2024-05-20 MED ORDER — VANCOMYCIN HCL IN DEXTROSE 1-5 GM/200ML-% IV SOLN
1000.0000 mg | Freq: Once | INTRAVENOUS | Status: AC
  Administered 2024-05-20: 1000 mg via INTRAVENOUS
  Filled 2024-05-20 (×2): qty 200

## 2024-05-20 MED ORDER — SODIUM CHLORIDE 0.9 % IV SOLN
2.0000 g | Freq: Two times a day (BID) | INTRAVENOUS | Status: DC
  Administered 2024-05-20: 2 g via INTRAVENOUS
  Filled 2024-05-20: qty 20

## 2024-05-20 MED ORDER — SODIUM CHLORIDE 0.9 % IV SOLN
2.0000 g | INTRAVENOUS | Status: DC
  Administered 2024-05-20 – 2024-05-21 (×7): 2 g via INTRAVENOUS
  Filled 2024-05-20 (×9): qty 2000

## 2024-05-20 MED ORDER — PRAMIPEXOLE DIHYDROCHLORIDE 0.25 MG PO TABS
0.5000 mg | ORAL_TABLET | Freq: Every day | ORAL | Status: DC
  Administered 2024-05-20 – 2024-05-22 (×3): 0.5 mg via ORAL
  Filled 2024-05-20 (×3): qty 2

## 2024-05-20 MED ORDER — FOLIC ACID 1 MG PO TABS
1.0000 mg | ORAL_TABLET | Freq: Every day | ORAL | Status: DC
  Administered 2024-05-20 – 2024-05-23 (×4): 1 mg via ORAL
  Filled 2024-05-20 (×4): qty 1

## 2024-05-20 MED ORDER — LABETALOL HCL 5 MG/ML IV SOLN
20.0000 mg | Freq: Once | INTRAVENOUS | Status: DC
  Filled 2024-05-20: qty 4

## 2024-05-20 MED ORDER — VANCOMYCIN HCL 1500 MG/300ML IV SOLN
1500.0000 mg | Freq: Once | INTRAVENOUS | Status: AC
  Administered 2024-05-20: 1500 mg via INTRAVENOUS
  Filled 2024-05-20: qty 300

## 2024-05-20 MED ORDER — LORAZEPAM 2 MG/ML IJ SOLN
1.0000 mg | Freq: Once | INTRAMUSCULAR | Status: AC
  Administered 2024-05-20: 1 mg via INTRAVENOUS
  Filled 2024-05-20: qty 1

## 2024-05-20 MED ORDER — LABETALOL HCL 5 MG/ML IV SOLN
10.0000 mg | Freq: Four times a day (QID) | INTRAVENOUS | Status: DC | PRN
  Administered 2024-05-20 (×2): 10 mg via INTRAVENOUS
  Filled 2024-05-20 (×2): qty 4

## 2024-05-20 MED ORDER — LORAZEPAM 1 MG PO TABS: 1.0000 mg | ORAL_TABLET | ORAL | Status: DC | PRN

## 2024-05-20 MED ORDER — LABETALOL HCL 5 MG/ML IV SOLN
10.0000 mg | Freq: Once | INTRAVENOUS | Status: AC
  Administered 2024-05-20: 10 mg via INTRAVENOUS
  Filled 2024-05-20: qty 4

## 2024-05-20 MED ORDER — ALPRAZOLAM 0.5 MG PO TABS
0.5000 mg | ORAL_TABLET | Freq: Two times a day (BID) | ORAL | Status: DC | PRN
  Administered 2024-05-20 – 2024-05-21 (×3): 0.5 mg via ORAL
  Filled 2024-05-20 (×3): qty 1

## 2024-05-20 MED ORDER — DEXTROSE-SODIUM CHLORIDE 5-0.45 % IV SOLN: INTRAVENOUS | Status: DC

## 2024-05-20 MED ORDER — ATORVASTATIN CALCIUM 40 MG PO TABS
40.0000 mg | ORAL_TABLET | Freq: Every evening | ORAL | Status: DC
  Administered 2024-05-20 – 2024-05-22 (×3): 40 mg via ORAL
  Filled 2024-05-20 (×3): qty 1

## 2024-05-20 MED ORDER — SODIUM CHLORIDE 0.9% FLUSH: 10.0000 mL | INTRAVENOUS | Status: DC | PRN

## 2024-05-20 MED ORDER — LORAZEPAM 2 MG/ML IJ SOLN: 1.0000 mg | INTRAMUSCULAR | Status: DC | PRN

## 2024-05-20 MED ORDER — ACETAMINOPHEN 650 MG RE SUPP: 650.0000 mg | Freq: Four times a day (QID) | RECTAL | Status: DC | PRN

## 2024-05-20 MED ORDER — OLANZAPINE 10 MG IM SOLR
5.0000 mg | Freq: Two times a day (BID) | INTRAMUSCULAR | Status: DC | PRN
  Filled 2024-05-20: qty 10

## 2024-05-20 MED ORDER — THIAMINE HCL 100 MG/ML IJ SOLN
100.0000 mg | Freq: Every day | INTRAMUSCULAR | Status: DC
  Administered 2024-05-21: 100 mg via INTRAVENOUS
  Filled 2024-05-20: qty 2

## 2024-05-20 MED ORDER — CARVEDILOL 25 MG PO TABS
25.0000 mg | ORAL_TABLET | Freq: Two times a day (BID) | ORAL | Status: DC
  Administered 2024-05-20 – 2024-05-23 (×6): 25 mg via ORAL
  Filled 2024-05-20 (×6): qty 1

## 2024-05-20 MED ORDER — THIAMINE MONONITRATE 100 MG PO TABS
100.0000 mg | ORAL_TABLET | Freq: Every day | ORAL | Status: DC
  Administered 2024-05-20 – 2024-05-23 (×3): 100 mg via ORAL
  Filled 2024-05-20 (×4): qty 1

## 2024-05-20 MED ORDER — AMLODIPINE BESYLATE 10 MG PO TABS
10.0000 mg | ORAL_TABLET | Freq: Every day | ORAL | Status: DC
  Administered 2024-05-20 – 2024-05-23 (×4): 10 mg via ORAL
  Filled 2024-05-20 (×4): qty 1

## 2024-05-20 MED ORDER — HYDRALAZINE HCL 20 MG/ML IJ SOLN
10.0000 mg | Freq: Four times a day (QID) | INTRAMUSCULAR | Status: DC | PRN
  Administered 2024-05-20 – 2024-05-22 (×2): 10 mg via INTRAVENOUS
  Filled 2024-05-20 (×3): qty 1

## 2024-05-20 MED ORDER — HYDRALAZINE HCL 20 MG/ML IJ SOLN
20.0000 mg | Freq: Once | INTRAMUSCULAR | Status: AC
  Administered 2024-05-20: 20 mg via INTRAVENOUS
  Filled 2024-05-20: qty 1

## 2024-05-20 MED ORDER — VANCOMYCIN HCL 1250 MG/250ML IV SOLN
1250.0000 mg | Freq: Two times a day (BID) | INTRAVENOUS | Status: DC
  Administered 2024-05-20 – 2024-05-21 (×2): 1250 mg via INTRAVENOUS
  Filled 2024-05-20 (×2): qty 250

## 2024-05-20 MED ORDER — ASPIRIN 81 MG PO TBEC
81.0000 mg | DELAYED_RELEASE_TABLET | Freq: Every day | ORAL | Status: DC
  Administered 2024-05-20 – 2024-05-23 (×4): 81 mg via ORAL
  Filled 2024-05-20 (×4): qty 1

## 2024-05-20 MED ORDER — DOXAZOSIN MESYLATE 2 MG PO TABS
2.0000 mg | ORAL_TABLET | Freq: Two times a day (BID) | ORAL | Status: DC
  Administered 2024-05-20 – 2024-05-23 (×7): 2 mg via ORAL
  Filled 2024-05-20 (×7): qty 1

## 2024-05-20 MED ORDER — METOCLOPRAMIDE HCL 5 MG/ML IJ SOLN: 10.0000 mg | Freq: Three times a day (TID) | INTRAMUSCULAR | Status: DC

## 2024-05-20 NOTE — ED Notes (Signed)
 Pt eating lunch, continues to do well out of restraints.  Pt aware to self, place.  Stated, I am less confused.

## 2024-05-20 NOTE — H&P (Addendum)
 " History and Physical    Raymond [name]:[id number] DOB: 05/26/1966 DOA: 05/19/2024  PCP: Raymond Miquel Faden, MD Patient coming from: Home  Chief Complaint: altered mental status  HPI: Raymond [name] is a 58 y.o. male with medical history significant of hypertension, DM type 2,  OSA on CPAP, moderate COPD, GERD, HLD, BPH associated with nocturia, chronic diastolic heart failure, restless leg syndrome and history of substance abuse who presented to the hospital with complaint of altered mental status.  The patient is a poor historian at this time and therefore history is being obtained from chart review and ER provider at this time. Apparently  patient presented to the ER with similar symptoms in October and his alteration in mental status was thought to be secondary to polypharmacy. He states that he is not sure if the patient is still taking the same medications at this time. The report that is noted from EMS was that the patient appeared to be at baseline with mental status last prior to 7 am in the morning on  05/19/2024.  They stated that after this period patient was noted to be more altered. EMS stated they were concerned because patient met sepsis criteria and was prone to urinary tract infections.  He felt warm to touch and was able to respond to verbal stimuli on their evaluation. The patient was AxOX2 and was subsequently restrained and brought to the hospital. Appears that he did require a dose of versed  5mg  IV and 500cc bolus enroute.    ED Course:  In the ER, BP 182/91, HR 118, RR 17, O2 saturation 99% on RA,  and Tmax 99.3. Cbc demonstrated wbc 15.8, hb/hct 12.4/37.8, and platelet 342. Chemistry demonstrated Na 142, K 3.1, Cl 100, bicarb 31, Bun/Cr  39/1.72 and glucose 136. Respiratory PCR was negative.  CXR demonstrated no acute cardiopulmonary findings. Urinalysis was negative. Blood cultures X2 have been ordered. EKG demonstrated sinus tachycardia with prolonged QTC of 480.  Review  of Systems:  All systems reviewed and apart from history of presenting illness, are negative.  Past Medical History:  Diagnosis Date   Acute kidney injury superimposed on chronic kidney disease 04/13/2023   Acute on chronic anemia 04/13/2023   Anemia    Anxiety    Back pain    COPD (chronic obstructive pulmonary disease) (HCC)    Depression    Diabetes mellitus without complication (HCC)    Diverticulosis    with hx of LGIB   Edema    GERD (gastroesophageal reflux disease)    GIB (gastrointestinal bleeding)    Gout    Heavy smoker    Hyperlipidemia    Hypertension    Iron deficiency anemia    Sleep apnea    uses a cpap   Swelling of lower extremity     Past Surgical History:  Procedure Laterality Date   CHONDROPLASTY Left 06/28/2014   Procedure: CHONDROPLASTY;  Surgeon: LELON JONETTA Shari Mickey., MD;  Location: [facility];  Service: Orthopedics;  Laterality: Left;   COLONOSCOPY     FOOT FASCIOTOMY     age 63-rt   KNEE ARTHROSCOPY WITH LATERAL MENISECTOMY Left 06/28/2014   Procedure: KNEE ARTHROSCOPY WITH LATERAL MENISECTOMY;  Surgeon: LELON JONETTA Shari Mickey., MD;  Location: [facility];  Service: Orthopedics;  Laterality: Left;   KNEE ARTHROSCOPY WITH MEDIAL MENISECTOMY Left 06/28/2014   Procedure: LEFT KNEE ARTHROSCOPY CHONDROPLASTY/WITH MEDIAL/LATERAL MENISECTOMIES;  Surgeon: LELON JONETTA Shari Mickey., MD;  Location: [facility];  Service: Orthopedics;  Laterality: Left;   ORIF RADIUS & ULNA FRACTURES  2007   left   THORACIC DISCECTOMY N/A 03/12/2023   Procedure: THORACIC LAMINECTOMY AND  DISCECTOMY;  Surgeon: Gillie Duncans, MD;  Location: [facility] OR;  Service: Neurosurgery;  Laterality: N/A;     reports that he has quit smoking. His smoking use included cigarettes. He has never used smokeless tobacco. He reports current alcohol use. He reports that he does not use drugs.  Allergies[1]  Family History  Problem Relation Age of Onset   Diabetes Mother     Hypertension Mother    High Cholesterol Mother    Heart disease Mother    Depression Mother    Diabetes Father    Hypertension Father    High Cholesterol Father    Heart disease Father    Cancer Other     Prior to Admission medications  Medication Sig Start Date End Date Taking? Authorizing Provider  albuterol  (VENTOLIN  HFA) 108 (90 Base) MCG/ACT inhaler Inhale 2 puffs into the lungs every 6 (six) hours as needed for wheezing or shortness of breath. 05/12/23   Raulkar, Sven SQUIBB, MD  allopurinol  (ZYLOPRIM ) 300 MG tablet Take 1 tablet (300 mg total) by mouth daily. 05/12/23   Raulkar, Sven SQUIBB, MD  ALPRAZolam  (XANAX ) 0.5 MG tablet Take 1 tablet (0.5 mg total) by mouth 2 (two) times daily as needed. 05/12/23   Raulkar, Sven SQUIBB, MD  amLODipine  (NORVASC ) 10 MG tablet Take 10 mg by mouth daily. 07/20/23   [provider]  aspirin  EC 81 MG tablet Take 81 mg by mouth daily. Swallow whole.    [provider]  atorvastatin  (LIPITOR) 40 MG tablet Take 1 tablet (40 mg total) by mouth every evening. 05/12/23   Raulkar, Sven SQUIBB, MD  baclofen  (LIORESAL ) 20 MG tablet Take 1 tablet (20 mg total) by mouth 3 (three) times daily. 04/27/24   Lovorn, Megan, MD  carvedilol  (COREG ) 25 MG tablet Take 1 tablet (25 mg total) by mouth in the morning and at bedtime. 05/12/23   Raulkar, Sven SQUIBB, MD  diclofenac  Sodium (VOLTAREN ) 1 % GEL Apply 4 g topically 4 (four) times daily. To bilateral knees 05/12/23   Raulkar, Sven SQUIBB, MD  diphenhydrAMINE  (BENADRYL ) 25 mg capsule Take 25 mg by mouth as needed. Patient not taking: Reported on 05/16/2024    [provider]  doxazosin  (CARDURA ) 2 MG tablet Take 1 tablet (2 mg total) by mouth at bedtime. Patient taking differently: Take 2 mg by mouth 2 (two) times daily. 05/12/23   Raulkar, Sven SQUIBB, MD  DULoxetine  (CYMBALTA ) 30 MG capsule Take 1 capsule by mouth once daily Patient taking differently: Take 60 mg by mouth daily. 06/24/23   Raulkar,  Sven SQUIBB, MD  DULoxetine  (CYMBALTA ) 60 MG capsule Take 60 mg by mouth daily. Patient not taking: Reported on 05/16/2024    [provider]  furosemide  (LASIX ) 80 MG tablet Take 80 mg by mouth 2 (two) times daily. Patient not taking: Reported on 05/16/2024    [provider]  hydrALAZINE  (APRESOLINE ) 25 MG tablet Take 25 mg by mouth 3 (three) times daily.    [provider]  lidocaine  (LIDODERM ) 5 % Place 2 patches onto the skin daily at 10 pm. Remove & Discard patch within 12 hours or as directed by MD 05/12/23   Raulkar, Sven SQUIBB, MD  metolazone (ZAROXOLYN) 2.5 MG tablet Take 2.5 mg by mouth daily. Patient not taking: Reported on 05/16/2024  [provider]  omeprazole (PRILOSEC) 40 MG capsule Take 40 mg by mouth daily. 07/02/23   [provider]  ondansetron  (ZOFRAN ) 4 MG tablet Take 1 tablet (4 mg total) by mouth every 8 (eight) hours as needed for nausea or vomiting. 04/18/24   Delores Shields A, DO  oxyCODONE -acetaminophen  (PERCOCET) 10-325 MG tablet Take 1 tablet by mouth every 6 (six) hours as needed for pain. 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 03/23/24   Yolande Lamar BROCKS, MD  pramipexole  (MIRAPEX ) 0.5 MG tablet Take 1 tablet (0.5 mg total) by mouth in the morning and at bedtime. 05/12/23   Raulkar, Sven SQUIBB, MD  promethazine  (PHENERGAN ) 12.5 MG tablet Take 1 tablet (12.5 mg total) by mouth every 6 (six) hours as needed for refractory nausea / vomiting. 04/27/24   Lovorn, Megan, MD  tamsulosin  (FLOMAX ) 0.4 MG CAPS capsule Take 1 capsule (0.4 mg total) by mouth at bedtime. 04/09/23   Love, Sharlet RAMAN, PA-C  tirzepatide  (MOUNJARO ) 5 MG/0.5ML Pen Inject 5 mg into the skin once a week. 05/16/24   Delores Shields A, DO  tiZANidine  (ZANAFLEX ) 4 MG tablet Take 1 tablet (4 mg total) by mouth 3 (three) times daily. - for spasticity  with baclofen  04/27/24   Lovorn, Megan, MD  topiramate  (TOPAMAX ) 25 MG tablet Take 1 tablet (25 mg total) by mouth 2 (two) times daily. 05/12/23   Raulkar, Sven SQUIBB, MD  Vitamin D , Ergocalciferol , (DRISDOL ) 1.25 MG (50000 UNIT) CAPS capsule Take 1 capsule (50,000 Units total) by mouth every 7 (seven) days. 05/16/24   Delores Shields LABOR, DO    Physical Exam: Vitals:   05/19/24 2209 05/20/24 0130 05/20/24 0145 05/20/24 0151  BP: (!) 186/130  (!) 196/92   Pulse: (!) 103 83 (!) 112   Resp: 17 16 12    Temp: 98 F (36.7 C)   99.3 F (37.4 C)  TempSrc: Oral   Oral  SpO2: 96% 96% 93%     Physical Exam Constitutional:      General: He is not in acute distress.    Appearance: Normal appearance.  HENT:     Head: Normocephalic and atraumatic.  Eyes:     Extraocular Movements: Extraocular movements intact.     Conjunctiva/sclera: Conjunctivae normal.     Pupils: Pupils are equal, round, and reactive to light.  Cardiovascular:     Rate and Rhythm: tahcycardia.     Pulses: Normal pulses.     Heart sounds: Normal heart sounds.  Pulmonary:     Effort: Pulmonary effort is normal. No respiratory distress.     Breath sounds: Normal breath sounds. No wheezing, rhonchi or rales.  Abdominal:     General: Abdomen is flat. Bowel sounds are normal. There is no distension.     Palpations: Abdomen is soft.     Tenderness: There is no abdominal tenderness.  Musculoskeletal:        General: No deformity. Normal range of motion.  Skin:    General: erythema of the right groin region. There appears to be induration as well     Coloration: Skin is not jaundiced.  Neurological:     General: No focal deficit present.     Mental Status: He is alert and oriented to person, place, not time. Mental  status is not at baseline   Labs on Admission: I  have personally reviewed following labs and imaging studies  CBC: Recent Labs  Lab 05/19/24 2158  WBC 15.8*  NEUTROABS 12.6*  HGB 12.4*  HCT 37.8*  MCV 89.6   PLT 342   Basic Metabolic Panel: Recent Labs  Lab 05/19/24 2158  NA 142  K 3.1*  CL 100  CO2 31  GLUCOSE 136*  BUN 39*  CREATININE 1.72*  CALCIUM  10.0   GFR: Estimated Creatinine Clearance: 66.9 mL/min (A) (by C-G formula based on SCr of 1.72 mg/dL (H)). Liver Function Tests: Recent Labs  Lab 05/19/24 2158  AST 17  ALT 17  ALKPHOS 111  BILITOT 0.4  PROT 8.1  ALBUMIN  4.2   No results for input(s): LIPASE, AMYLASE in the last 168 hours. Recent Labs  Lab 05/20/24 0217  AMMONIA 19   Coagulation Profile: Recent Labs  Lab 05/20/24 0218  INR 1.0   Cardiac Enzymes: No results for input(s): CKTOTAL, CKMB, CKMBINDEX, TROPONINI in the last 168 hours. BNP (last 3 results) No results for input(s): PROBNP in the last 8760 hours. HbA1C: No results for input(s): HGBA1C in the last 72 hours. CBG: No results for input(s): GLUCAP in the last 168 hours. Lipid Profile: No results for input(s): CHOL, HDL, LDLCALC, TRIG, CHOLHDL, LDLDIRECT in the last 72 hours. Thyroid  Function Tests: No results for input(s): TSH, T4TOTAL, FREET4, T3FREE, THYROIDAB in the last 72 hours. Anemia Panel: No results for input(s): VITAMINB12, FOLATE, FERRITIN, TIBC, IRON, RETICCTPCT in the last 72 hours. Urine analysis:    Component Value Date/Time   COLORURINE STRAW (A) 05/20/2024 0115   APPEARANCEUR CLEAR 05/20/2024 0115   LABSPEC 1.010 05/20/2024 0115   PHURINE 5.0 05/20/2024 0115   GLUCOSEU NEGATIVE 05/20/2024 0115   HGBUR NEGATIVE 05/20/2024 0115   BILIRUBINUR NEGATIVE 05/20/2024 0115   KETONESUR NEGATIVE 05/20/2024 0115   PROTEINUR 100 (A) 05/20/2024 0115   NITRITE NEGATIVE 05/20/2024 0115   LEUKOCYTESUR NEGATIVE 05/20/2024 0115    Radiological Exams on Admission: CT Head Wo Contrast Result Date: 05/20/2024 EXAM: CT HEAD WITHOUT CONTRAST 05/20/2024 02:54:41 AM TECHNIQUE: CT of the head was performed without the administration of  intravenous contrast. Automated exposure control, iterative reconstruction, and/or weight based adjustment of the mA/kV was utilized to reduce the radiation dose to as low as reasonably achievable. COMPARISON: 03/30/2024 CLINICAL HISTORY: Mental status change, unknown cause. FINDINGS: LIMITATIONS: Motion limited study. BRAIN AND VENTRICLES: No acute hemorrhage. No evidence of acute infarct. No extra-axial collection. No mass effect or midline shift. ORBITS: No acute abnormality. SINUSES: No acute abnormality. SOFT TISSUES AND SKULL: No acute soft tissue abnormality. No skull fracture. IMPRESSION: 1. No acute intracranial abnormality. 2. Evaluation limited by motion artifact. Electronically signed by: Oneil Devonshire MD 05/20/2024 03:15 AM EST RP Workstation: [id number]   DG Chest Port 1 View if patient is in a treatment room. Result Date: 05/19/2024 EXAM: 1 VIEW(S) XRAY OF THE CHEST 05/19/2024 10:10:00 PM COMPARISON: 03/30/2024 CLINICAL HISTORY: Suspected Sepsis FINDINGS: LUNGS AND PLEURA: Lower lung volumes. No focal pulmonary opacity. No pleural effusion. No pneumothorax. HEART AND MEDIASTINUM: Stable cardiomegaly. BONES AND SOFT TISSUES: No acute osseous abnormality. IMPRESSION: 1. No acute findings. 2. Stable cardiomegaly. Electronically signed by: Greig Pique MD 05/19/2024 10:17 PM EST RP Workstation: [id number]    EKG: Independently reviewed.   Assessment/Plan Principal Problem:   Acute metabolic encephalopathy Active Problems:   Hypertensive urgency   Hyperlipidemia   History of substance abuse (HCC)   Moderate COPD (chronic obstructive pulmonary disease) (HCC)   Type 2 diabetes  mellitus, without long-term current use of insulin  (HCC)   Prolonged QT interval  Acute metabolic encephalopathy  Etiology unknown at this time There is suspicion of polypharmacy Infectious etiology has not been identified at this time Blood cultures are pending at this time CXR was negative for pneumonia at this  time TSH, RPR and ethanol levels have been ordered at this time Ammonia level was normal   Sepsis  Tachycardia, tahcypnea and wbc of 15.8 Afebrile with Tmax of 99.3 There is not a known source at this time Blood cultures have been ordered He does appear to have a rash in the right groin This could demonstrate possible intertrigo Will start on antibiotics and antifungal for this area as well Lumbar puncture was considered for further evaluation Patient not stable enough at this time Will start on antibiotics and reevaluate later    Hypertensive urgency Blood pressure is elevated at this time  Appears patient has baseline high blood pressure Unlikely he took his medications today Will start on prn medications at this time He appears to be moving all extremities and there is low suspicion for stroke Head CT was performed and it is negative as well  The blood pressure might also be accurate because patient is restrained and thrashing around in bed   History of substance abuse Will obtain urine drug screen on the patient at this time Narcan was not given or medications administered as patient was not hypoxic or had other findings of opioid intoxication other than AMS Will continue to monitor   Type 2 DM Patient will be started on low dose sliding scale insulin  Will add basal dose insulin  as needed  Moderate COPD Patient appears stable at this time No complaints   Prolonged QT interval  Will be careful with the medications given to patient    DVT prophylaxis:  lovenox   Code Status: full  Family Communication:   Disposition Plan:  Patient class is not currently inpatient or observation. Update patient class prior to completing documentation    Consults called: Admission status: inpatient  Level of care: Level of care:  The medical decision making on this patient was of high complexity and the patient is at high risk for clinical deterioration, therefore this is a level 3  visit. Bradly MARLA Drones MD [facility]  If 7PM-7AM, please contact night-coverage [email or web address]  05/20/2024, 4:01 AM       [1]  Allergies Allergen Reactions   Haldol  [Haloperidol ] Other (See Comments)    Makes drowsy   Morphine  Other (See Comments)    Mental status changes - angry, mean    Sulfa  Antibiotics Hives    Other Reaction(s): Unknown   "

## 2024-05-20 NOTE — ED Notes (Signed)
 Pt was switch into safety cuffs due to him still being able to bite himself and cause harm to himself. He was attempted to pull out his 2nd IV and lines. He gave himself a skin tear on his left hand. Pt is non-violent with staff but spits across the room and causes harm to himself.

## 2024-05-20 NOTE — ED Notes (Signed)
 Pt states his name, following simple commands, does not know where he is or what year is it.  Pt reassured and re oriented by RN.  Trial out of restraint for bilat feet. BP elevated, hydralazine  given.  MD messaged regarding pt able to drink yet or not.

## 2024-05-20 NOTE — Progress Notes (Signed)
 Pharmacy Antibiotic Note  Raymond [name] is a 58 y.o. male admitted on 05/19/2024 with sepsis/rule out meningitis.  Pharmacy has been consulted for Vancomycin  dosing.  Weight = 146.1 kg (05/16/2024)  Plan: Vancomycin  2500mg  IV x 1 loading dose (1000mg  followed by 1500mg ) Vancomycin  1750 mg IV Q 24 hrs. Goal AUC 400-550.  Expected AUC: 535  SCr used: 1.72 Ampicillin per MD Ceftriaxone  per MD Follow renal function F/u culture results & sensitivities     Temp (24hrs), Avg:98.6 F (37 C), Min:98 F (36.7 C), Max:99.3 F (37.4 C)  Recent Labs  Lab 05/19/24 2158 05/19/24 2204  WBC 15.8*  --   CREATININE 1.72*  --   LATICACIDVEN  --  1.0    Estimated Creatinine Clearance: 66.9 mL/min (A) (by C-G formula based on SCr of 1.72 mg/dL (H)).    Allergies[1]  Antimicrobials this admission: 12/18 Ceftriaxone  >>   12/19 Ampicillin >>   12/19 Vancomycin  >>  Dose adjustments this admission:    Microbiology results: 12/18 BCx:      Thank you for allowing pharmacy to be a part of this patients care.  Arvin Gauss, PharmD 05/20/2024 5:23 AM     [1]  Allergies Allergen Reactions   Haldol  [Haloperidol ] Other (See Comments)    Makes drowsy   Morphine  Other (See Comments)    Mental status changes - angry, mean    Sulfa  Antibiotics Hives    Other Reaction(s): Unknown

## 2024-05-20 NOTE — ED Notes (Signed)
 Wife to bedside, updated.  Pt alert to himself, who his wife is, still thinks he is at [place].  Pt re oriented by nurse.  Trial out of both wrist restraints.  Pt made aware that he was pulling out his IVs, biting himself during the night and that is why he had been in restraints to protect himself.  He had bitten himself on the left hand and there is a bite mark from it.  Wife aware as well.   Dr. Dino notified of pts blood pressure.  Orders written

## 2024-05-20 NOTE — ED Notes (Signed)
 Pt has calmed down slightly but still yelling out confused and trying to bring his hands to his face but is unable due to restraints.

## 2024-05-20 NOTE — Plan of Care (Signed)

## 2024-05-20 NOTE — Progress Notes (Addendum)
" °  Progress Note   Patient: Raymond [name] FMW:[id number] DOB: Nov 17, 1965 DOA: 05/19/2024     0 DOS: the patient was seen and examined on 05/20/2024   Same Day Admission Note:  58 y.o. male with medical history significant of hypertension, DM type 2,  OSA on CPAP, moderate COPD, GERD, HLD, BPH associated with nocturia, chronic diastolic heart failure, restless leg syndrome and history of substance abuse who presented to the hospital with complaint of altered mental status.   Discussed with his wife at the bedside. His confusion started yesterday morning when he woke up. He couldn't remember significant details including name of the [place]  President. He is bed bound at home and has a wheel chair He is on muscle relaxants and opioids at home There is a concern for infection, source is not obvious. He is on broad spectrum IV antibiotics His BP is also really high because he didn't take his BP meds yesterday because of the vomiting  CT head didn't know any acute abnormalities.   Author: Deliliah Room, MD 05/20/2024 12:17 PM  For on call review [email or web address].  "

## 2024-05-20 NOTE — Progress Notes (Signed)
 Pharmacy Antibiotic Note  Raymond [name] is a 58 y.o. male admitted on 05/19/2024 with AMS, concern for meningitis.  Pharmacy has been consulted for vancomycin  dosing.  Today, 05/20/2024 WBC trending down Afebrile SCr appears to be at baseline  Plan: Ampicillin  2 g IV q4h Ceftriaxone  2 g IV q12h Will adjust vancomycin  dose to 1250 mg IV q12h to target VT of 15-20 instead of AUC given concern for meningitis. Check VT once at steady state Follow renal function Follow culture data. If CNS infection ruled out, will decrease dose of antibiotics accordingly.      Temp (24hrs), Avg:98.6 F (37 C), Min:98 F (36.7 C), Max:99.3 F (37.4 C)  Recent Labs  Lab 05/19/24 2158 05/19/24 2204 05/20/24 0218 05/20/24 0621  WBC 15.8*  --   --  11.8*  CREATININE 1.72*  --  1.57*  --   LATICACIDVEN  --  1.0  --   --     Estimated Creatinine Clearance: 73.3 mL/min (A) (by C-G formula based on SCr of 1.57 mg/dL (H)).    Allergies[1]  Antimicrobials this admission: ampicillin  12/19 >>  vancomycin  12/19 >>  Ceftriaxone  12/19 >>  Dose adjustments this admission:  Microbiology results: 12/18 BCx: ngtd  Raymond [name], PharmD 05/20/2024 10:09 AM    [1]  Allergies Allergen Reactions   Haldol  [Haloperidol ] Other (See Comments)    Makes drowsy   Morphine  Other (See Comments)    Mental status changes - angry, mean    Sulfa  Antibiotics Hives    Other Reaction(s): Unknown

## 2024-05-20 NOTE — ED Provider Notes (Signed)
 I, Larnell Gravely, assumed care for this patient.  In brief 58 year old male, altered mental status of unknown origin.  Patient with a previous episode similar to this in October of this past year, believed to be polypharmacy contributing to his symptoms.  Patient is on multiple sedating medications.  Unclear if he is taking them as prescribed.  Patient afebrile, is hypertensive.  Have ordered some push labetalol  for him CT imaging the patient's head is negative.  Mildly hypokalemic, renal function at baseline, does have a leukocytosis.  Patient started on 2 g of Rocephin .  I have lower suspicion for meningitis, however cannot reasonably excluded.  Given the patient's BMI of 45, and requiring multiple rounds of antipsychotics for sedation, believe the ED lumbar puncture is not appropriate.  He is getting covered with appropriate antibiotics.  No rashes noted.  Will admit patient to hospitalist for altered mental status.   Gravely Pac T, DO 05/20/24 [phone]

## 2024-05-21 DIAGNOSIS — G9341 Metabolic encephalopathy: Secondary | ICD-10-CM | POA: Diagnosis not present

## 2024-05-21 LAB — CREATININE, SERUM
Creatinine, Ser: 1.48 mg/dL — ABNORMAL HIGH (ref 0.61–1.24)
GFR, Estimated: 54 mL/min — ABNORMAL LOW

## 2024-05-21 LAB — GLUCOSE, CAPILLARY: Glucose-Capillary: 134 mg/dL — ABNORMAL HIGH (ref 70–99)

## 2024-05-21 MED ORDER — MELATONIN 5 MG PO TABS
5.0000 mg | ORAL_TABLET | Freq: Every evening | ORAL | Status: DC | PRN
  Administered 2024-05-21 – 2024-05-22 (×2): 5 mg via ORAL
  Filled 2024-05-21 (×2): qty 1

## 2024-05-21 MED ORDER — INSULIN ASPART 100 UNIT/ML IJ SOLN
0.0000 [IU] | Freq: Three times a day (TID) | INTRAMUSCULAR | Status: DC
  Administered 2024-05-22: 4 [IU] via SUBCUTANEOUS
  Administered 2024-05-22 – 2024-05-23 (×3): 3 [IU] via SUBCUTANEOUS
  Filled 2024-05-21 (×2): qty 3
  Filled 2024-05-21: qty 4
  Filled 2024-05-21: qty 3

## 2024-05-21 MED ORDER — INSULIN ASPART 100 UNIT/ML IJ SOLN: 0.0000 [IU] | Freq: Every day | INTRAMUSCULAR | Status: DC

## 2024-05-21 MED ORDER — LOPERAMIDE HCL 2 MG PO CAPS
2.0000 mg | ORAL_CAPSULE | Freq: Four times a day (QID) | ORAL | Status: DC | PRN
  Administered 2024-05-21: 2 mg via ORAL
  Filled 2024-05-21: qty 1

## 2024-05-21 NOTE — Plan of Care (Signed)
  Problem: Clinical Measurements: Goal: Ability to maintain clinical measurements within normal limits will improve Outcome: Progressing   Problem: Coping: Goal: Level of anxiety will decrease Outcome: Progressing   Problem: Pain Managment: Goal: General experience of comfort will improve and/or be controlled Outcome: Progressing

## 2024-05-21 NOTE — Plan of Care (Signed)

## 2024-05-21 NOTE — Progress Notes (Signed)
" °   05/21/24 2204  BiPAP/CPAP/SIPAP  BiPAP/CPAP/SIPAP Pt Type Adult  BiPAP/CPAP/SIPAP Resmed  Mask Type Full face mask  Dentures removed? Not applicable  FiO2 (%) 21 %  Patient Home Machine Yes  Safety Check Completed by RT for Home Unit Yes, no issues noted  Patient Home Mask Yes  Patient Home Tubing Yes  Auto Titrate No    "

## 2024-05-21 NOTE — Progress Notes (Signed)
 " PROGRESS NOTE    Raymond [name]  FMW:[id number] DOB: 1966/03/21 DOA: 05/19/2024 PCP: [name], MD   Brief Narrative:  58 y.o. male with medical history significant of hypertension, DM type 2,  OSA on CPAP, moderate COPD, GERD, hyperlipidemia, BPH associated with nocturia, chronic diastolic heart failure, restless leg syndrome and history of substance presented with altered mental status.  On presentation, he was hypertensive, tachycardic and afebrile.  WBC of 15.8.  Potassium 3.1 and creatinine of 1.72.  COVID/influenza/RSV PCR negative.  Chest x-ray showed no acute cardiopulmonary findings.  Urinalysis was negative.  He was started on broad-spectrum antibiotics.  Assessment & Plan:   Acute metabolic versus toxic encephalopathy - Possibly from polypharmacy.  Workup negative so far including negative CT of the head, chest x-ray and urinalysis. - Ammonia and TSH were normal.  Check B12 and folate levels in AM. -Patient was started on broad-spectrum antibiotics on presentation.?  Concern for meningitis.  LP not pursued. -Mental status has much improved and currently back to baseline possibly.  Doubt that patient has meningitis.  No evidence of infection.  DC antibiotics and monitor.  Sepsis has been ruled out  Leukocytosis If improving.  Monitor  Hypokalemia - No labs today.  Monitor  Hypertensive urgency - Blood pressure elevated but improving.  Continue amlodipine , Coreg , hydralazine .  History of substance abuse - Urine drug screen was positive for benzodiazepines.  CKD stage IIIb - Creatinine at baseline.  Monitor  COPD -Currently stable.  Obesity class III - Outpatient follow-up  Physical deconditioning PT eval  BPH Continue tamsulosin   Anxiety and depression--continue alprazolam  as needed  Prolonged QT -Continue telemetry monitoring.  Replace electrolytes as needed   DVT prophylaxis: Lovenox  Code Status: Full Family Communication: None at  bedside Disposition Plan: Status is: Inpatient Remains inpatient appropriate because: Of severity of illness    Consultants: None  Procedures: None  Antimicrobials:  Anti-infectives (From admission, onward)    Start     Dose/Rate Route Frequency Ordered Stop   05/21/24 0600  vancomycin  (VANCOREADY) IVPB 1750 mg/350 mL  Status:  Discontinued        1,750 mg 175 mL/hr over 120 Minutes Intravenous Every 24 hours 05/20/24 0522 05/20/24 1008   05/20/24 2000  vancomycin  (VANCOREADY) IVPB 1250 mg/250 mL        1,250 mg 166.7 mL/hr over 90 Minutes Intravenous Every 12 hours 05/20/24 1016     05/20/24 1200  cefTRIAXone  (ROCEPHIN ) 2 g in sodium chloride  0.9 % 100 mL IVPB        2 g 200 mL/hr over 30 Minutes Intravenous Every 12 hours 05/20/24 0510     05/20/24 0700  vancomycin  (VANCOREADY) IVPB 1500 mg/300 mL       Placed in Followed by Linked Group   1,500 mg 150 mL/hr over 120 Minutes Intravenous  Once 05/20/24 0519 05/20/24 1605   05/20/24 0600  ampicillin  (OMNIPEN) 2 g in sodium chloride  0.9 % 100 mL IVPB        2 g 300 mL/hr over 20 Minutes Intravenous Every 4 hours 05/20/24 0512     05/20/24 0600  vancomycin  (VANCOCIN ) IVPB 1000 mg/200 mL premix       Placed in Followed by Linked Group   1,000 mg 200 mL/hr over 60 Minutes Intravenous  Once 05/20/24 0519 05/20/24 0652   05/19/24 2330  cefTRIAXone  (ROCEPHIN ) 2 g in sodium chloride  0.9 % 100 mL IVPB        2 g 200 mL/hr  over 30 Minutes Intravenous  Once 05/19/24 2316 05/20/24 0050   05/19/24 2315  cefTRIAXone  (ROCEPHIN ) 1 g in sodium chloride  0.9 % 100 mL IVPB  Status:  Discontinued        1 g 200 mL/hr over 30 Minutes Intravenous  Once 05/19/24 2313 05/19/24 2316        Subjective: Patient seen and examined at bedside.  Awake, answering questions.  Feels better.  Having diarrhea.  No fever, seizures or agitation reported.  Objective: Vitals:   05/20/24 2206 05/21/24 0217 05/21/24 0654 05/21/24 0918  BP: (!) 166/64  (!) 165/91 (!) 178/93 (!) 180/90  Pulse: 65 96 90   Resp: 19 20 20    Temp: 98.4 F (36.9 C) 98.8 F (37.1 C) 98.1 F (36.7 C)   TempSrc: Oral Oral Oral   SpO2: 95% 96% 94%   Weight:      Height:        Intake/Output Summary (Last 24 hours) at 05/21/2024 1053 Last data filed at 05/21/2024 9092 Gross per 24 hour  Intake 2450.26 ml  Output 2200 ml  Net 250.26 ml   Filed Weights   05/20/24 1601  Weight: (!) 147 kg    Examination:  General exam: Appears calm and comfortable.  Chronically ill and deconditioned looking. Respiratory system: Bilateral decreased breath sounds at bases with scattered crackles Cardiovascular system: S1 & S2 heard, Rate controlled Gastrointestinal system: Abdomen is morbidly obese, nondistended, soft and nontender. Normal bowel sounds heard. Extremities: No cyanosis, clubbing; trace lower extremity edema  Central nervous system: Alert and, answers questions appropriately.  No focal neurological deficits. Moving extremities Skin: No rashes, lesions or ulcers Psychiatry: Judgement and insight appear normal. Mood & affect appropriate.     Data Reviewed: I have personally reviewed following labs and imaging studies  CBC: Recent Labs  Lab 05/19/24 2158 05/20/24 0621  WBC 15.8* 11.8*  NEUTROABS 12.6*  --   HGB 12.4* 12.4*  HCT 37.8* 38.2*  MCV 89.6 90.3  PLT 342 295   Basic Metabolic Panel: Recent Labs  Lab 05/19/24 2158 05/20/24 0218 05/21/24 0142  NA 142  --   --   K 3.1*  --   --   CL 100  --   --   CO2 31  --   --   GLUCOSE 136*  --   --   BUN 39*  --   --   CREATININE 1.72* 1.57* 1.48*  CALCIUM  10.0  --   --    GFR: Estimated Creatinine Clearance: 78.1 mL/min (A) (by C-G formula based on SCr of 1.48 mg/dL (H)). Liver Function Tests: Recent Labs  Lab 05/19/24 2158  AST 17  ALT 17  ALKPHOS 111  BILITOT 0.4  PROT 8.1  ALBUMIN  4.2   No results for input(s): LIPASE, AMYLASE in the last 168 hours. Recent Labs  Lab  05/20/24 0217  AMMONIA 19   Coagulation Profile: Recent Labs  Lab 05/20/24 0218  INR 1.0   Cardiac Enzymes: No results for input(s): CKTOTAL, CKMB, CKMBINDEX, TROPONINI in the last 168 hours. BNP (last 3 results) No results for input(s): PROBNP in the last 8760 hours. HbA1C: No results for input(s): HGBA1C in the last 72 hours. CBG: No results for input(s): GLUCAP in the last 168 hours. Lipid Profile: No results for input(s): CHOL, HDL, LDLCALC, TRIG, CHOLHDL, LDLDIRECT in the last 72 hours. Thyroid  Function Tests: Recent Labs    05/20/24 0621  TSH 0.703   Anemia Panel: No results for  input(s): VITAMINB12, FOLATE, FERRITIN, TIBC, IRON, RETICCTPCT in the last 72 hours. Sepsis Labs: Recent Labs  Lab 05/19/24 2204  LATICACIDVEN 1.0    Recent Results (from the past 240 hours)  Culture, blood (Routine x 2)     Status: None (Preliminary result)   Collection Time: 05/19/24  9:58 PM   Specimen: BLOOD RIGHT ARM  Result Value Ref Range Status   Specimen Description   Final    BLOOD RIGHT ARM Performed at [facility], [address] [place], [place] [address]    Special Requests   Final    BOTTLES DRAWN AEROBIC AND ANAEROBIC Blood Culture results [place] not be optimal due to an inadequate volume of blood received in culture bottles Performed at [facility], [address] [place], [place] [address]    Culture   Final    NO GROWTH 1 DAY Performed at [facility], [address], [place], [place] [address]    Report Status PENDING  Incomplete  Culture, blood (Routine x 2)     Status: None (Preliminary result)   Collection Time: 05/19/24  9:58 PM   Specimen: BLOOD RIGHT ARM  Result Value Ref Range Status   Specimen Description   Final    BLOOD RIGHT ARM Performed at [facility], [address], [place], [place] [address]    Special Requests   Final    BOTTLES DRAWN  AEROBIC AND ANAEROBIC Blood Culture results [place] not be optimal due to an inadequate volume of blood received in culture bottles Performed at [facility], [address], [place], [place] [address]    Culture   Final    NO GROWTH 1 DAY Performed at [facility], [address], [place], [place] [address]    Report Status PENDING  Incomplete  Resp panel by RT-PCR (RSV, Flu A&B, Covid) Anterior Nasal Swab     Status: None   Collection Time: 05/20/24 12:00 AM   Specimen: Anterior Nasal Swab  Result Value Ref Range Status   SARS Coronavirus 2 by RT PCR NEGATIVE NEGATIVE Final    Comment: (NOTE) SARS-CoV-2 target nucleic acids are NOT DETECTED.  The SARS-CoV-2 RNA is generally detectable in upper respiratory specimens during the acute phase of infection. The lowest concentration of SARS-CoV-2 viral copies this assay can detect is 138 copies/mL. A negative result does not preclude SARS-Cov-2 infection and should not be used as the sole basis for treatment or other patient management decisions. A negative result [place] occur with  improper specimen collection/handling, submission of specimen other than nasopharyngeal swab, presence of viral mutation(s) within the areas targeted by this assay, and inadequate number of viral copies(<138 copies/mL). A negative result must be combined with clinical observations, patient history, and epidemiological information. The expected result is Negative.  Fact Sheet for Patients:  bloggercourse.com  Fact Sheet for Healthcare Providers:  seriousbroker.it  This test is no t yet approved or cleared by the [place]  FDA and  has been authorized for detection and/or diagnosis of SARS-CoV-2 by FDA under an Emergency Use Authorization (EUA). This EUA will remain  in effect (meaning this test can be used) for the duration of the COVID-19 declaration under Section 564(b)(1) of the  Act, 21 U.S.C.section 360bbb-3(b)(1), unless the authorization is terminated  or revoked sooner.       Influenza A by PCR NEGATIVE NEGATIVE Final   Influenza B by PCR NEGATIVE NEGATIVE Final  Comment: (NOTE) The Xpert Xpress SARS-CoV-2/FLU/RSV plus assay is intended as an aid in the diagnosis of influenza from Nasopharyngeal swab specimens and should not be used as a sole basis for treatment. Nasal washings and aspirates are unacceptable for Xpert Xpress SARS-CoV-2/FLU/RSV testing.  Fact Sheet for Patients: bloggercourse.com  Fact Sheet for Healthcare Providers: seriousbroker.it  This test is not yet approved or cleared by the [place]  FDA and has been authorized for detection and/or diagnosis of SARS-CoV-2 by FDA under an Emergency Use Authorization (EUA). This EUA will remain in effect (meaning this test can be used) for the duration of the COVID-19 declaration under Section 564(b)(1) of the Act, 21 U.S.C. section 360bbb-3(b)(1), unless the authorization is terminated or revoked.     Resp Syncytial Virus by PCR NEGATIVE NEGATIVE Final    Comment: (NOTE) Fact Sheet for Patients: bloggercourse.com  Fact Sheet for Healthcare Providers: seriousbroker.it  This test is not yet approved or cleared by the [place]  FDA and has been authorized for detection and/or diagnosis of SARS-CoV-2 by FDA under an Emergency Use Authorization (EUA). This EUA will remain in effect (meaning this test can be used) for the duration of the COVID-19 declaration under Section 564(b)(1) of the Act, 21 U.S.C. section 360bbb-3(b)(1), unless the authorization is terminated or revoked.  Performed at [facility], [address] [place], [place] [address]          Radiology Studies: CT Head Wo Contrast Result Date: 05/20/2024 EXAM: CT HEAD WITHOUT CONTRAST  05/20/2024 02:54:41 AM TECHNIQUE: CT of the head was performed without the administration of intravenous contrast. Automated exposure control, iterative reconstruction, and/or weight based adjustment of the mA/kV was utilized to reduce the radiation dose to as low as reasonably achievable. COMPARISON: 03/30/2024 CLINICAL HISTORY: Mental status change, unknown cause. FINDINGS: LIMITATIONS: Motion limited study. BRAIN AND VENTRICLES: No acute hemorrhage. No evidence of acute infarct. No extra-axial collection. No mass effect or midline shift. ORBITS: No acute abnormality. SINUSES: No acute abnormality. SOFT TISSUES AND SKULL: No acute soft tissue abnormality. No skull fracture. IMPRESSION: 1. No acute intracranial abnormality. 2. Evaluation limited by motion artifact. Electronically signed by: Oneil Devonshire MD 05/20/2024 03:15 AM EST RP Workstation: [id number]   DG Chest Port 1 View if patient is in a treatment room. Result Date: 05/19/2024 EXAM: 1 VIEW(S) XRAY OF THE CHEST 05/19/2024 10:10:00 PM COMPARISON: 03/30/2024 CLINICAL HISTORY: Suspected Sepsis FINDINGS: LUNGS AND PLEURA: Lower lung volumes. No focal pulmonary opacity. No pleural effusion. No pneumothorax. HEART AND MEDIASTINUM: Stable cardiomegaly. BONES AND SOFT TISSUES: No acute osseous abnormality. IMPRESSION: 1. No acute findings. 2. Stable cardiomegaly. Electronically signed by: Greig Pique MD 05/19/2024 10:17 PM EST RP Workstation: [id number]        Scheduled Meds:  amLODipine   10 mg Oral Daily   aspirin  EC  81 mg Oral Daily   atorvastatin   40 mg Oral QPM   carvedilol   25 mg Oral BID WC   doxazosin   2 mg Oral BID   enoxaparin  (LOVENOX ) injection  70 mg Subcutaneous Q24H   folic acid   1 mg Oral Daily   hydrALAZINE   25 mg Oral TID   labetalol   20 mg Intravenous Once   multivitamin with minerals  1 tablet Oral Daily   nystatin    Topical TID   pramipexole   0.5 mg Oral QHS   tamsulosin   0.4 mg Oral QHS   thiamine   100 mg Oral Daily    Or   thiamine   100 mg Intravenous  Daily   Continuous Infusions:  ampicillin  (OMNIPEN) IV 2 g (05/21/24 0917)   cefTRIAXone  (ROCEPHIN )  IV 2 g (05/20/24 2306)   vancomycin  1,250 mg (05/21/24 9076)          Sophie Mao, MD [facility] 05/21/2024, 10:53 AM   "

## 2024-05-22 ENCOUNTER — Inpatient Hospital Stay (HOSPITAL_COMMUNITY): Payer: PRIVATE HEALTH INSURANCE

## 2024-05-22 DIAGNOSIS — M79671 Pain in right foot: Secondary | ICD-10-CM

## 2024-05-22 DIAGNOSIS — G9341 Metabolic encephalopathy: Secondary | ICD-10-CM | POA: Diagnosis not present

## 2024-05-22 DIAGNOSIS — I160 Hypertensive urgency: Secondary | ICD-10-CM

## 2024-05-22 LAB — CBC WITH DIFFERENTIAL/PLATELET
Abs Immature Granulocytes: 0.09 K/uL — ABNORMAL HIGH (ref 0.00–0.07)
Basophils Absolute: 0.1 K/uL (ref 0.0–0.1)
Basophils Relative: 1 %
Eosinophils Absolute: 0.8 K/uL — ABNORMAL HIGH (ref 0.0–0.5)
Eosinophils Relative: 7 %
HCT: 35.4 % — ABNORMAL LOW (ref 39.0–52.0)
Hemoglobin: 11.5 g/dL — ABNORMAL LOW (ref 13.0–17.0)
Immature Granulocytes: 1 %
Lymphocytes Relative: 16 %
Lymphs Abs: 1.8 K/uL (ref 0.7–4.0)
MCH: 29.2 pg (ref 26.0–34.0)
MCHC: 32.5 g/dL (ref 30.0–36.0)
MCV: 89.8 fL (ref 80.0–100.0)
Monocytes Absolute: 0.8 K/uL (ref 0.1–1.0)
Monocytes Relative: 7 %
Neutro Abs: 7.9 K/uL — ABNORMAL HIGH (ref 1.7–7.7)
Neutrophils Relative %: 68 %
Platelets: 284 K/uL (ref 150–400)
RBC: 3.94 MIL/uL — ABNORMAL LOW (ref 4.22–5.81)
RDW: 15.9 % — ABNORMAL HIGH (ref 11.5–15.5)
WBC: 11.4 K/uL — ABNORMAL HIGH (ref 4.0–10.5)
nRBC: 0 % (ref 0.0–0.2)

## 2024-05-22 LAB — COMPREHENSIVE METABOLIC PANEL WITH GFR
ALT: 13 U/L (ref 0–44)
AST: 13 U/L — ABNORMAL LOW (ref 15–41)
Albumin: 3.5 g/dL (ref 3.5–5.0)
Alkaline Phosphatase: 88 U/L (ref 38–126)
Anion gap: 9 (ref 5–15)
BUN: 17 mg/dL (ref 6–20)
CO2: 29 mmol/L (ref 22–32)
Calcium: 9.2 mg/dL (ref 8.9–10.3)
Chloride: 102 mmol/L (ref 98–111)
Creatinine, Ser: 1.26 mg/dL — ABNORMAL HIGH (ref 0.61–1.24)
GFR, Estimated: >60 mL/min
Glucose, Bld: 117 mg/dL — ABNORMAL HIGH (ref 70–99)
Potassium: 3.1 mmol/L — ABNORMAL LOW (ref 3.5–5.1)
Sodium: 140 mmol/L (ref 135–145)
Total Bilirubin: 0.5 mg/dL (ref 0.0–1.2)
Total Protein: 7 g/dL (ref 6.5–8.1)

## 2024-05-22 LAB — HEMOGLOBIN A1C
Hgb A1c MFr Bld: 6.7 % — ABNORMAL HIGH (ref 4.8–5.6)
Mean Plasma Glucose: 145.59 mg/dL

## 2024-05-22 LAB — FOLATE: Folate: 11.9 ng/mL

## 2024-05-22 LAB — GLUCOSE, CAPILLARY
Glucose-Capillary: 138 mg/dL — ABNORMAL HIGH (ref 70–99)
Glucose-Capillary: 157 mg/dL — ABNORMAL HIGH (ref 70–99)
Glucose-Capillary: 139 mg/dL — ABNORMAL HIGH (ref 70–99)
Glucose-Capillary: 149 mg/dL — ABNORMAL HIGH (ref 70–99)

## 2024-05-22 LAB — VITAMIN B12: Vitamin B-12: 529 pg/mL (ref 180–914)

## 2024-05-22 LAB — MAGNESIUM: Magnesium: 1.9 mg/dL (ref 1.7–2.4)

## 2024-05-22 MED ORDER — BACLOFEN 10 MG PO TABS
10.0000 mg | ORAL_TABLET | Freq: Two times a day (BID) | ORAL | Status: DC
  Administered 2024-05-22 – 2024-05-23 (×3): 10 mg via ORAL
  Filled 2024-05-22 (×3): qty 1

## 2024-05-22 MED ORDER — LABETALOL HCL 5 MG/ML IV SOLN
20.0000 mg | INTRAVENOUS | Status: DC | PRN
  Filled 2024-05-22: qty 4

## 2024-05-22 MED ORDER — FUROSEMIDE 40 MG PO TABS
80.0000 mg | ORAL_TABLET | Freq: Two times a day (BID) | ORAL | Status: DC
  Administered 2024-05-22 – 2024-05-23 (×3): 80 mg via ORAL
  Filled 2024-05-22 (×3): qty 2

## 2024-05-22 MED ORDER — OXYCODONE-ACETAMINOPHEN 5-325 MG PO TABS
1.0000 | ORAL_TABLET | Freq: Three times a day (TID) | ORAL | Status: DC | PRN
  Administered 2024-05-22 – 2024-05-23 (×3): 1 via ORAL
  Filled 2024-05-22 (×3): qty 1

## 2024-05-22 MED ORDER — ALLOPURINOL 100 MG PO TABS
300.0000 mg | ORAL_TABLET | Freq: Every day | ORAL | Status: DC
  Administered 2024-05-22 – 2024-05-23 (×2): 300 mg via ORAL
  Filled 2024-05-22 (×2): qty 3

## 2024-05-22 MED ORDER — POTASSIUM CHLORIDE CRYS ER 20 MEQ PO TBCR
40.0000 meq | EXTENDED_RELEASE_TABLET | Freq: Three times a day (TID) | ORAL | Status: AC
  Administered 2024-05-22 (×2): 40 meq via ORAL
  Filled 2024-05-22 (×2): qty 2

## 2024-05-22 MED ORDER — MAGNESIUM SULFATE 2 GM/50ML IV SOLN
2.0000 g | Freq: Once | INTRAVENOUS | Status: AC
  Administered 2024-05-22: 2 g via INTRAVENOUS
  Filled 2024-05-22: qty 50

## 2024-05-22 MED ORDER — DULOXETINE HCL 60 MG PO CPEP
60.0000 mg | ORAL_CAPSULE | Freq: Every day | ORAL | Status: DC
  Administered 2024-05-22 – 2024-05-23 (×2): 60 mg via ORAL
  Filled 2024-05-22 (×2): qty 1

## 2024-05-22 NOTE — Evaluation (Signed)
 Occupational Therapy Evaluation Patient Details Name: Raymond [name] MRN: [id number] DOB: [birth year] Today's Date: 05/22/2024   History of Present Illness   58 yo male presents to therapy following hospital admission on 05/19/2024 due to AMS pt found to be hypertensive, tachycardic and dx with acute metabolic encephalopathy.   Pt hospitalized in August due to abdominal pain with A and V.  Pt hospitalized 12/24/2022 due to progressive R LE pain and B LE weakness and edema impacting functional mobility. Pt sustained several recent falls and once of which resulted in R lateral malleolus fx 12/06/2022 pt presented to ED and d/c home with brace. Pt had follow up OP with orthopedic 7/14 and continued recommendation for conservative management and R LE WBAT with CAM boot donned for 6-8 weeks. Pt subsequently fell again on 7/21 resulting in increased LE pain and edema and presented to ED no acute fx and d/c home. Pt returned to ED on 7/24 indicating he has been bed bound since 7/21 due to inability to WB through R LE with worsening B knee pain R > L.and new onset of LE weakness. Pt R lateral malleolar fx is now slightly displaced distal fibular fx and Pt also found to have anasarca with B LE edema and suspect for cellulitis with wound weeping. Pt PMH including but not limited to: DM II, tobacco abuse, COPD, gout, HLD, DVT, diverticulosis, GI bleed, chronic LBP, CKD, anxiety, depression, HTN, spinal surgery (2024) and OSA on CPAP.     Clinical Impressions Pt resting comfortably in bed, feeling better than at admission. R foot with some bruising to toes, xray pending. Pt lives with wife, has been going to OPPT, transfers to w/c with lateral scoot transfers without physical assist. Good BUE strength/ROM. Pt currently close to baseline, min A assist to EOB, increased effort for Pt. Pt set up/CGA for lateral scoot with transfer board, no physical assist needed, OT holding w/c for stability only. Pt propelled w/c ~300-400  feet, 1-2 small rest breaks. Pt at baseline, has no further acute OT needs, wife can help with ADLs as needed at home, plans to return to OPPT. No OT follow up needed.     If plan is discharge home, recommend the following:   Two people to help with walking and/or transfers;A lot of help with bathing/dressing/bathroom;Assistance with cooking/housework;Assist for transportation;Help with stairs or ramp for entrance     Functional Status Assessment   Patient has had a recent decline in their functional status and demonstrates the ability to make significant improvements in function in a reasonable and predictable amount of time.     Equipment Recommendations   None recommended by OT     Recommendations for Other Services         Precautions/Restrictions   Precautions Precautions: Fall Recall of Precautions/Restrictions: Intact Restrictions Weight Bearing Restrictions Per Provider Order: No     Mobility Bed Mobility Overal bed mobility: Needs Assistance Bed Mobility: Supine to Sit     Supine to sit: Min assist     General bed mobility comments: cues, increased time, use of hospital bed and bed pad to complete scooting to EOB    Transfers Overall transfer level: Needs assistance Equipment used: Sliding board Transfers: Bed to chair/wheelchair/BSC            Lateral/Scoot Transfers: Contact guard assist, +2 physical assistance, +2 safety/equipment, With slide board, From elevated surface General transfer comment: spouse brought pt personal wc and sliding board to hospital, PT requested that spouse  please bring in L arm rest as well and agreeable, pt required set up with assist to place and remove sliding board and complete lateral scoot with A to hold wc for stability and CGA to block anteriorly      Balance Overall balance assessment: Mild deficits observed, not formally tested, History of Falls                                          ADL either performed or assessed with clinical judgement   ADL Overall ADL's : Needs assistance/impaired;At baseline Eating/Feeding: Independent   Grooming: Set up;Sitting   Upper Body Bathing: Moderate assistance;Sitting   Lower Body Bathing: Maximal assistance;Sitting/lateral leans   Upper Body Dressing : Moderate assistance   Lower Body Dressing: Total assistance;Sitting/lateral leans       Toileting- Clothing Manipulation and Hygiene: Total assistance;Bed level         General ADL Comments: Pt able to perform lateral scoot transfer with set up/CGA to w/c and bed.     Vision Baseline Vision/History: 1 Wears glasses Ability to See in Adequate Light: 0 Adequate Patient Visual Report: No change from baseline       Perception         Praxis         Pertinent Vitals/Pain Pain Assessment Pain Assessment: No/denies pain     Extremity/Trunk Assessment Upper Extremity Assessment Upper Extremity Assessment: Overall WFL for tasks assessed   Lower Extremity Assessment Lower Extremity Assessment: Defer to PT evaluation   Cervical / Trunk Assessment Cervical / Trunk Assessment: Back Surgery   Communication Communication Communication: No apparent difficulties Factors Affecting Communication: Hearing impaired   Cognition Arousal: Alert Behavior During Therapy: WFL for tasks assessed/performed Cognition: No apparent impairments                               Following commands: Intact       Cueing  General Comments   Cueing Techniques: Verbal cues  PT made aware of orders for imaging for R foot, noted to have bruising of phalagies 2-4 pt is able to perform ROM WFL and no pain at time of eval awating MD recommendations following review of imaging and PT at this time limiting R LE WB with sliding board transfer, and pt placing B leg rest on wc   Exercises     Shoulder Instructions      Home Living Family/patient expects to be discharged  to:: Private residence Living Arrangements: Spouse/significant other Available Help at Discharge: Family;Available 24 hours/day Type of Home: House Home Access: Ramped entrance     Home Layout: One level     Bathroom Shower/Tub: Chief Strategy Officer: Handicapped height Bathroom Accessibility: Yes How Accessible: Accessible via wheelchair;Accessible via walker Home Equipment: Rolling Walker (2 wheels);BSC/3in1;Wheelchair - manual;Rollator (4 wheels);Wheelchair - power   Additional Comments: Pt lives with wife who can assist as needed, daughter able to assist as well. pt and spouse report pt progrssing with OPPT services and able to ambulate short distances (25 feet with RW)      Prior Functioning/Environment Prior Level of Function : Needs assist             Mobility Comments: lateral scoot into w/c with sliding board ADLs Comments: Pt needs help with dressing/bathing    OT Problem  List: Decreased strength;Impaired balance (sitting and/or standing);Obesity   OT Treatment/Interventions: Self-care/ADL training;Therapeutic exercise;Energy conservation;DME and/or AE instruction;Therapeutic activities;Patient/family education;Balance training      OT Goals(Current goals can be found in the care plan section)   Acute Rehab OT Goals Patient Stated Goal: to improve strength OT Goal Formulation: With patient/family Time For Goal Achievement: 06/05/24 Potential to Achieve Goals: Good   OT Frequency:  Min 2X/week    Co-evaluation PT/OT/SLP Co-Evaluation/Treatment: Yes Reason for Co-Treatment: Complexity of the patient's impairments (multi-system involvement);Necessary to address cognition/behavior during functional activity;For patient/therapist safety;To address functional/ADL transfers PT goals addressed during session: Mobility/safety with mobility;Balance;Proper use of DME OT goals addressed during session: ADL's and self-care;Proper use of Adaptive equipment  and DME;Strengthening/ROM      AM-PAC OT 6 Clicks Daily Activity     Outcome Measure Help from another person eating meals?: None Help from another person taking care of personal grooming?: A Little Help from another person toileting, which includes using toliet, bedpan, or urinal?: Total Help from another person bathing (including washing, rinsing, drying)?: A Lot Help from another person to put on and taking off regular upper body clothing?: A Lot Help from another person to put on and taking off regular lower body clothing?: Total 6 Click Score: 13   End of Session Nurse Communication: Mobility status  Activity Tolerance: Patient tolerated treatment well Patient left: in chair;with call bell/phone within reach;with family/visitor present  OT Visit Diagnosis: Unsteadiness on feet (R26.81);Other abnormalities of gait and mobility (R26.89);Muscle weakness (generalized) (M62.81)                Time: 8794-8768 OT Time Calculation (min): 26 min Charges:  OT General Charges $OT Visit: 1 Visit OT Evaluation $OT Eval Moderate Complexity: 1 [address], OTR/L   Elouise JONELLE Bott 05/22/2024, 2:47 PM

## 2024-05-22 NOTE — Progress Notes (Signed)
 " PROGRESS NOTE    Raymond [name]  FMW:[id number] DOB: [birth year] DOA: 05/19/2024 PCP: [name], MD   Brief Narrative:  58 y.o. male with medical history significant of hypertension, DM type 2,  OSA on CPAP, moderate COPD, GERD, hyperlipidemia, BPH associated with nocturia, chronic diastolic heart failure, restless leg syndrome and history of substance presented with altered mental status.  On presentation, he was hypertensive, tachycardic and afebrile.  WBC of 15.8.  Potassium 3.1 and creatinine of 1.72.  COVID/influenza/RSV PCR negative.  Chest x-ray showed no acute cardiopulmonary findings.  Urinalysis was negative.  He was started on broad-spectrum antibiotics.  Assessment & Plan:   Acute metabolic versus toxic encephalopathy - Possibly from polypharmacy.  Workup negative so far including negative CT of the head, chest x-ray and urinalysis. Sepsis has been ruled out. - Ammonia and TSH were normal.  B12 529, folic acid  11.9.   - Patient was initially started on antibiotic for meningitis coverage although LP was not pursued.  Since mentation improved rapidly and likelihood of meningeal infection is very low antibiotics were discontinued.  Seems to be doing well this morning.  He remains afebrile.  Mentation is gradually improving.  Right foot pain Noted to have bruising along his 2nd, 3rd and 4th toe this morning.  He is complaining of pain in that foot.  No history of trauma to that foot.  He has very good dorsalis pedis pulses.  The foot is warm to touch.  Do not suspect any vascular etiology at this time.  Will proceed with x-ray of his right foot.    Hypokalemia Continue to supplement potassium level.  Magnesium  is 1.9.  Hypertensive urgency Blood pressure remains poorly controlled.  He is noted to be on amlodipine , carvedilol , doxazosin , hydralazine .  Will review his home medication.  There is room to go up on his hydralazine .  History of substance abuse - Urine drug  screen was positive for benzodiazepines.  CKD stage IIIb - Creatinine at baseline.  Monitor  COPD -Currently stable.  Obesity class III - Outpatient follow-up  Physical deconditioning PT eval  BPH Continue tamsulosin   Anxiety and depression Continue alprazolam  as needed  Prolonged QT -Continue telemetry monitoring.  Replace electrolytes as needed   DVT prophylaxis: Lovenox  Code Status: Full Family Communication: None at bedside Disposition Plan: Await PT OT eval.  Consultants: None  Procedures: None  Subjective: Patient complaining of pain in his right foot.  Otherwise he denies any nausea vomiting abdominal pain, cough shortness of breath.    Objective: Vitals:   05/21/24 2300 05/22/24 0602 05/22/24 0825 05/22/24 1020  BP: (!) 165/79 (!) 172/88 (!) 176/92 (!) 178/73  Pulse:  90    Resp:  20    Temp:  98 F (36.7 C)    TempSrc:  Oral    SpO2:  96%    Weight:      Height:        Intake/Output Summary (Last 24 hours) at 05/22/2024 1023 Last data filed at 05/22/2024 0500 Gross per 24 hour  Intake --  Output 1800 ml  Net -1800 ml   Filed Weights   05/20/24 1601  Weight: (!) 147 kg    Examination:  General appearance: Awake alert.  In no distress.  Mildly distracted Resp: Clear to auscultation bilaterally.  Normal effort Cardio: S1-S2 is normal regular.  No S3-S4.  No rubs murmurs or bruit GI: Abdomen is soft.  Nontender nondistended.  Bowel sounds are present normal.  No  masses organomegaly Extremities: Bruising noted over the right 2nd, 3rd and 4th toes.  Good dorsalis pedis pulses.  Physical deconditioning is present Neurologic: Alert and oriented x3.  No focal neurological deficits.    Data Reviewed: I have personally reviewed following labs and reports of imaging studies  CBC: Recent Labs  Lab 05/19/24 2158 05/20/24 0621 05/22/24 0346  WBC 15.8* 11.8* 11.4*  NEUTROABS 12.6*  --  7.9*  HGB 12.4* 12.4* 11.5*  HCT 37.8* 38.2* 35.4*  MCV  89.6 90.3 89.8  PLT 342 295 284   Basic Metabolic Panel: Recent Labs  Lab 05/19/24 2158 05/20/24 0218 05/21/24 0142 05/22/24 0346  NA 142  --   --  140  K 3.1*  --   --  3.1*  CL 100  --   --  102  CO2 31  --   --  29  GLUCOSE 136*  --   --  117*  BUN 39*  --   --  17  CREATININE 1.72* 1.57* 1.48* 1.26*  CALCIUM  10.0  --   --  9.2  MG  --   --   --  1.9   GFR: Estimated Creatinine Clearance: 91.7 mL/min (A) (by C-G formula based on SCr of 1.26 mg/dL (H)). Liver Function Tests: Recent Labs  Lab 05/19/24 2158 05/22/24 0346  AST 17 13*  ALT 17 13  ALKPHOS 111 88  BILITOT 0.4 0.5  PROT 8.1 7.0  ALBUMIN  4.2 3.5    Recent Labs  Lab 05/20/24 0217  AMMONIA 19   Coagulation Profile: Recent Labs  Lab 05/20/24 0218  INR 1.0   HbA1C: Recent Labs    05/22/24 0346  HGBA1C 6.7*   CBG: Recent Labs  Lab 05/21/24 2055 05/22/24 0756  GLUCAP 134* 138*    Thyroid  Function Tests: Recent Labs    05/20/24 0621  TSH 0.703   Anemia Panel: Recent Labs    05/22/24 0346  VITAMINB12 529  FOLATE 11.9   Sepsis Labs: Recent Labs  Lab 05/19/24 2204  LATICACIDVEN 1.0    Recent Results (from the past 240 hours)  Culture, blood (Routine x 2)     Status: None (Preliminary result)   Collection Time: 05/19/24  9:58 PM   Specimen: BLOOD RIGHT ARM  Result Value Ref Range Status   Specimen Description   Final    BLOOD RIGHT ARM Performed at [facility], [address], [place], [place] [address]    Special Requests   Final    BOTTLES DRAWN AEROBIC AND ANAEROBIC Blood Culture results Sheridan not be optimal due to an inadequate volume of blood received in culture bottles Performed at [facility], [address] [place], [place] [address]    Culture   Final    NO GROWTH 2 DAYS Performed at [facility], [address], [place], [place] [address]    Report Status PENDING  Incomplete  Culture, blood (Routine x 2)     Status:  None (Preliminary result)   Collection Time: 05/19/24  9:58 PM   Specimen: BLOOD RIGHT ARM  Result Value Ref Range Status   Specimen Description   Final    BLOOD RIGHT ARM Performed at [facility], [address] [place], [place] [address]    Special Requests   Final    BOTTLES DRAWN AEROBIC AND ANAEROBIC Blood Culture results Trainer not be optimal due to an inadequate volume of blood received in culture bottles Performed  at [facility], [address], [place], [place] [address]    Culture   Final    NO GROWTH 2 DAYS Performed at [facility], [address] [place], [place] [address]    Report Status PENDING  Incomplete  Resp panel by RT-PCR (RSV, Flu A&B, Covid) Anterior Nasal Swab     Status: None   Collection Time: 05/20/24 12:00 AM   Specimen: Anterior Nasal Swab  Result Value Ref Range Status   SARS Coronavirus 2 by RT PCR NEGATIVE NEGATIVE Final    Comment: (NOTE) SARS-CoV-2 target nucleic acids are NOT DETECTED.  The SARS-CoV-2 RNA is generally detectable in upper respiratory specimens during the acute phase of infection. The lowest concentration of SARS-CoV-2 viral copies this assay can detect is 138 copies/mL. A negative result does not preclude SARS-Cov-2 infection and should not be used as the sole basis for treatment or other patient management decisions. A negative result Yankey occur with  improper specimen collection/handling, submission of specimen other than nasopharyngeal swab, presence of viral mutation(s) within the areas targeted by this assay, and inadequate number of viral copies(<138 copies/mL). A negative result must be combined with clinical observations, patient history, and epidemiological information. The expected result is Negative.  Fact Sheet for Patients:  bloggercourse.com  Fact Sheet for Healthcare Providers:  seriousbroker.it  This test is no t  yet approved or cleared by the [place]  FDA and  has been authorized for detection and/or diagnosis of SARS-CoV-2 by FDA under an Emergency Use Authorization (EUA). This EUA will remain  in effect (meaning this test can be used) for the duration of the COVID-19 declaration under Section 564(b)(1) of the Act, 21 U.S.C.section 360bbb-3(b)(1), unless the authorization is terminated  or revoked sooner.       Influenza A by PCR NEGATIVE NEGATIVE Final   Influenza B by PCR NEGATIVE NEGATIVE Final    Comment: (NOTE) The Xpert Xpress SARS-CoV-2/FLU/RSV plus assay is intended as an aid in the diagnosis of influenza from Nasopharyngeal swab specimens and should not be used as a sole basis for treatment. Nasal washings and aspirates are unacceptable for Xpert Xpress SARS-CoV-2/FLU/RSV testing.  Fact Sheet for Patients: bloggercourse.com  Fact Sheet for Healthcare Providers: seriousbroker.it  This test is not yet approved or cleared by the [place]  FDA and has been authorized for detection and/or diagnosis of SARS-CoV-2 by FDA under an Emergency Use Authorization (EUA). This EUA will remain in effect (meaning this test can be used) for the duration of the COVID-19 declaration under Section 564(b)(1) of the Act, 21 U.S.C. section 360bbb-3(b)(1), unless the authorization is terminated or revoked.     Resp Syncytial Virus by PCR NEGATIVE NEGATIVE Final    Comment: (NOTE) Fact Sheet for Patients: bloggercourse.com  Fact Sheet for Healthcare Providers: seriousbroker.it  This test is not yet approved or cleared by the [place]  FDA and has been authorized for detection and/or diagnosis of SARS-CoV-2 by FDA under an Emergency Use Authorization (EUA). This EUA will remain in effect (meaning this test can be used) for the duration of the COVID-19 declaration under Section 564(b)(1)  of the Act, 21 U.S.C. section 360bbb-3(b)(1), unless the authorization is terminated or revoked.  Performed at [facility], [address], [place], [place] [address]       Radiology Studies: No results found.  Scheduled Meds:  amLODipine   10 mg Oral Daily   aspirin  EC  81 mg Oral Daily   atorvastatin   40 mg Oral QPM   carvedilol   25 mg Oral BID WC   doxazosin   2 mg Oral BID   enoxaparin  (LOVENOX ) injection  70 mg Subcutaneous Q24H   folic acid   1 mg Oral Daily   hydrALAZINE   25 mg Oral TID   insulin  aspart  0-20 Units Subcutaneous TID WC   insulin  aspart  0-5 Units Subcutaneous QHS   labetalol   20 mg Intravenous Once   multivitamin with minerals  1 tablet Oral Daily   nystatin    Topical TID   potassium chloride   40 mEq Oral TID   pramipexole   0.5 mg Oral QHS   tamsulosin   0.4 mg Oral QHS   thiamine   100 mg Oral Daily   Or   thiamine   100 mg Intravenous Daily   Continuous Infusions:  magnesium  sulfate bolus IVPB       Chong January, MD [facility] 05/22/2024, 10:23 AM   "

## 2024-05-22 NOTE — Evaluation (Signed)
 Physical Therapy Evaluation Patient Details Name: Raymond [name] MRN: [id number] DOB: 15-Jan-1966 Today's Date: 05/22/2024  History of Present Illness  58 yo male presents to therapy following hospital admission on 05/19/2024 due to AMS pt found to be hypertensive, tachycardic and dx with acute metabolic encephalopathy.   Pt hospitalized in August due to abdominal pain with A and V.  Pt hospitalized 12/24/2022 due to progressive R LE pain and B LE weakness and edema impacting functional mobility. Pt sustained several recent falls and once of which resulted in R lateral malleolus fx 12/06/2022 pt presented to ED and d/c home with brace. Pt had follow up OP with orthopedic 7/14 and continued recommendation for conservative management and R LE WBAT with CAM boot donned for 6-8 weeks. Pt subsequently fell again on 7/21 resulting in increased LE pain and edema and presented to ED no acute fx and d/c home. Pt returned to ED on 7/24 indicating he has been bed bound since 7/21 due to inability to WB through R LE with worsening B knee pain R > L.and new onset of LE weakness. Pt R lateral malleolar fx is now slightly displaced distal fibular fx and Pt also found to have anasarca with B LE edema and suspect for cellulitis with wound weeping. Pt PMH including but not limited to: DM II, tobacco abuse, COPD, gout, HLD, DVT, diverticulosis, GI bleed, chronic LBP, CKD, anxiety, depression, HTN, spinal surgery (2024) and OSA on CPAP.  Clinical Impression    Pt admitted with above diagnosis.  Pt currently with functional limitations due to the deficits listed below (see PT Problem List). Pt in bed when PT and OT arrived. Pt agreeable to therapy intervention and eager to get OOB. Spouse brought personal sliding board, gait belt and wc to hospital. Pt required min A, increased time use of hospital bed features for supine to sit, set up and CGA x 2 for safety for lateral scoot transfer bed to wc, pt able to manage wc leg rests, required  A for R arm rest and PT requested spouse to bring in L arm rest when returns at next visit. Spuse in agreement. Pt able to self propel wc in hallway 350 feet with B UE, pt became fatigued and required some assist to return to personal room and elected to remain sitting up in wc. Spouse in room and all needs in place. Nurse made aware of A x 2 for safety and set up for sliding board transfer to return to bed. PT awaiting imaging results and MD recommendations prior to progression with R LE WB--standing and gait trials. Pt and spouse are electing to resume OPPT services s/p hospital d/c. Pt will benefit from acute skilled PT to increase their independence and safety with mobility to allow discharge.         If plan is discharge home, recommend the following: A little help with walking and/or transfers;A little help with bathing/dressing/bathroom;Assistance with cooking/housework;Assist for transportation   Can travel by private vehicle        Equipment Recommendations None recommended by PT  Recommendations for Other Services       Functional Status Assessment Patient has had a recent decline in their functional status and demonstrates the ability to make significant improvements in function in a reasonable and predictable amount of time.     Precautions / Restrictions Precautions Precautions: Fall Restrictions Weight Bearing Restrictions Per Provider Order: No      Mobility  Bed Mobility Overal bed mobility: Needs  Assistance Bed Mobility: Supine to Sit     Supine to sit: Min assist     General bed mobility comments: cues, increased time, use of hospital bed and bed pad to complete scooting to EOB    Transfers Overall transfer level: Needs assistance Equipment used: Sliding board Transfers: Bed to chair/wheelchair/BSC            Lateral/Scoot Transfers: Contact guard assist, +2 physical assistance, +2 safety/equipment, With slide board, From elevated surface General  transfer comment: spouse brought pt personal wc and sliding board to hospital, PT requested that spouse please bring in L arm rest as well and agreeable, pt required set up with assist to place and remove sliding board and complete lateral scoot with A to hold wc for stability and CGA to block anteriorly    Ambulation/Gait               General Gait Details: NT  Administrator mobility: Yes Wheelchair propulsion: Both upper extremities Wheelchair parts: Supervision/cueing Distance: 350 Wheelchair Assistance Details (indicate cue type and reason): pt able to manage wc leg rests, required A for R arm rest, min cues, pt able to perform weight shifting and offloading while in wc, pt is able to self propel with use of B UEs   Tilt Bed    Modified Rankin (Stroke Patients Only)       Balance Overall balance assessment: Mild deficits observed, not formally tested, History of Falls (standing NT)                                           Pertinent Vitals/Pain Pain Assessment Pain Assessment: No/denies pain    Home Living Family/patient expects to be discharged to:: Private residence Living Arrangements: Spouse/significant other Available Help at Discharge: Family;Available 24 hours/day Type of Home: House Home Access: Ramped entrance       Home Layout: One level Home Equipment: Agricultural Consultant (2 wheels);BSC/3in1;Wheelchair - Physiological Scientist (4 wheels);Wheelchair - power Additional Comments: Pt lives with wife who can assist as needed, daughter able to assist as well. pt and spouse report pt progrssing with OPPT services and able to ambulate short distances (25 feet with RW)    Prior Function Prior Level of Function : Needs assist             Mobility Comments: lateral scoot into w/c with sliding board ADLs Comments: Pt needs help with dressing/bathing     Extremity/Trunk Assessment    Upper Extremity Assessment Upper Extremity Assessment: Defer to OT evaluation    Lower Extremity Assessment Lower Extremity Assessment: Generalized weakness    Cervical / Trunk Assessment Cervical / Trunk Assessment: Back Surgery  Communication   Communication Communication: No apparent difficulties Factors Affecting Communication: Hearing impaired    Cognition Arousal: Alert Behavior During Therapy: WFL for tasks assessed/performed   PT - Cognitive impairments: No apparent impairments                         Following commands: Intact       Cueing Cueing Techniques: Verbal cues     General Comments General comments (skin integrity, edema, etc.): PT made aware of orders for imaging for R foot, noted to have bruising of phalagies 2-4 pt is able to perform ROM  WFL and no pain at time of eval awating MD recommendations following review of imaging and PT at this time limiting R LE WB with sliding board transfer, and pt placing B leg rest on wc    Exercises     Assessment/Plan    PT Assessment Patient needs continued PT services  PT Problem List Decreased strength;Decreased activity tolerance;Decreased balance;Decreased mobility;Decreased coordination       PT Treatment Interventions DME instruction;Gait training;Functional mobility training;Therapeutic activities;Therapeutic exercise;Balance training;Neuromuscular re-education;Patient/family education    PT Goals (Current goals can be found in the Care Plan section)  Acute Rehab PT Goals Patient Stated Goal: to get up OOB and go home PT Goal Formulation: With patient Time For Goal Achievement: 06/12/24 Potential to Achieve Goals: Good    Frequency Min 3X/week     Co-evaluation PT/OT/SLP Co-Evaluation/Treatment: Yes Reason for Co-Treatment: Complexity of the patient's impairments (multi-system involvement);Necessary to address cognition/behavior during functional activity;For patient/therapist safety;To  address functional/ADL transfers PT goals addressed during session: Mobility/safety with mobility;Balance;Proper use of DME OT goals addressed during session: ADL's and self-care;Proper use of Adaptive equipment and DME;Strengthening/ROM       AM-PAC PT 6 Clicks Mobility  Outcome Measure Help needed turning from your back to your side while in a flat bed without using bedrails?: A Little Help needed moving from lying on your back to sitting on the side of a flat bed without using bedrails?: A Little Help needed moving to and from a bed to a chair (including a wheelchair)?: A Little Help needed standing up from a chair using your arms (e.g., wheelchair or bedside chair)?: A Little Help needed to walk in hospital room?: Total Help needed climbing 3-5 steps with a railing? : Total 6 Click Score: 14    End of Session Equipment Utilized During Treatment: Gait belt Activity Tolerance: Patient tolerated treatment well;No increased pain Patient left: Other (comment);with family/visitor present (personal wc) Nurse Communication: Mobility status PT Visit Diagnosis: Unsteadiness on feet (R26.81);Other abnormalities of gait and mobility (R26.89);Muscle weakness (generalized) (M62.81);Difficulty in walking, not elsewhere classified (R26.2)    Time: 8794-8768 PT Time Calculation (min) (ACUTE ONLY): 26 min   Charges:   PT Evaluation $PT Eval Low Complexity: 1 Low   PT General Charges $$ ACUTE PT VISIT: 1 Visit         [place], PT Acute Rehab   [place] VEAR Drone 05/22/2024, 2:04 PM

## 2024-05-23 ENCOUNTER — Other Ambulatory Visit (HOSPITAL_COMMUNITY): Payer: Self-pay

## 2024-05-23 DIAGNOSIS — G9341 Metabolic encephalopathy: Secondary | ICD-10-CM | POA: Diagnosis not present

## 2024-05-23 LAB — BASIC METABOLIC PANEL WITH GFR
Anion gap: 11 (ref 5–15)
BUN: 18 mg/dL (ref 6–20)
CO2: 26 mmol/L (ref 22–32)
Calcium: 9.3 mg/dL (ref 8.9–10.3)
Chloride: 98 mmol/L (ref 98–111)
Creatinine, Ser: 1.37 mg/dL — ABNORMAL HIGH (ref 0.61–1.24)
GFR, Estimated: 59 mL/min — ABNORMAL LOW
Glucose, Bld: 125 mg/dL — ABNORMAL HIGH (ref 70–99)
Potassium: 3.6 mmol/L (ref 3.5–5.1)
Sodium: 135 mmol/L (ref 135–145)

## 2024-05-23 LAB — GLUCOSE, CAPILLARY
Glucose-Capillary: 131 mg/dL — ABNORMAL HIGH (ref 70–99)
Glucose-Capillary: 159 mg/dL — ABNORMAL HIGH (ref 70–99)

## 2024-05-23 LAB — CBC
HCT: 33.6 % — ABNORMAL LOW (ref 39.0–52.0)
Hemoglobin: 11 g/dL — ABNORMAL LOW (ref 13.0–17.0)
MCH: 29.5 pg (ref 26.0–34.0)
MCHC: 32.7 g/dL (ref 30.0–36.0)
MCV: 90.1 fL (ref 80.0–100.0)
Platelets: 273 K/uL (ref 150–400)
RBC: 3.73 MIL/uL — ABNORMAL LOW (ref 4.22–5.81)
RDW: 15.9 % — ABNORMAL HIGH (ref 11.5–15.5)
WBC: 11.6 K/uL — ABNORMAL HIGH (ref 4.0–10.5)
nRBC: 0 % (ref 0.0–0.2)

## 2024-05-23 LAB — MAGNESIUM: Magnesium: 2.2 mg/dL (ref 1.7–2.4)

## 2024-05-23 MED ORDER — HYDRALAZINE HCL 50 MG PO TABS
50.0000 mg | ORAL_TABLET | Freq: Three times a day (TID) | ORAL | Status: DC
  Administered 2024-05-23: 50 mg via ORAL
  Filled 2024-05-23: qty 1

## 2024-05-23 MED ORDER — HYDRALAZINE HCL 50 MG PO TABS
50.0000 mg | ORAL_TABLET | Freq: Three times a day (TID) | ORAL | 0 refills | Status: AC
  Filled 2024-05-23: qty 90, 30d supply, fill #0

## 2024-05-23 MED ORDER — POTASSIUM CHLORIDE CRYS ER 20 MEQ PO TBCR
40.0000 meq | EXTENDED_RELEASE_TABLET | Freq: Once | ORAL | Status: AC
  Administered 2024-05-23: 40 meq via ORAL
  Filled 2024-05-23: qty 2

## 2024-05-23 NOTE — Progress Notes (Signed)
 Orthopedic Tech Progress Note Patient Details:  Raymond [name] [birth year] [id number] Applied buddy tape to 3rd and 4th toe. Also, applied post op shoe and instructed pt on when and how to wear orthotic device.  Ortho Devices Type of Ortho Device: Buddy tape, Postop shoe/boot Ortho Device/Splint Location: RLE Ortho Device/Splint Interventions: Ordered, Application, Adjustment   Post Interventions Patient Tolerated: Well Instructions Provided: Adjustment of device, Care of device, Poper ambulation with device  Morna Pink 05/23/2024, 10:45 AM

## 2024-05-23 NOTE — Progress Notes (Signed)
" °   05/23/24 1111  TOC Brief Assessment  Insurance and Status Reviewed  Patient has primary care physician Yes (ALEXANDER, HARRY ANTHONY)  Home environment has been reviewed From home with spouse  Prior level of function: Independent  Prior/Current Home Services No current home services  Social Drivers of Health Review SDOH reviewed interventions complete (Substance use resources/education provided.)  Readmission risk has been reviewed Yes  Transition of care needs no transition of care needs at this time    "

## 2024-05-23 NOTE — Discharge Summary (Signed)
 " [facility]  Physician Discharge Summary   Patient ID: Raymond [name] MRN: [id number] DOB/AGE: 12/14/1965 57 y.o.  Admit date: 05/19/2024 Discharge date: 05/23/2024    PCP: [name], MD  DISCHARGE DIAGNOSES:    Acute metabolic encephalopathy   Hypertensive urgency   Hyperlipidemia   Moderate COPD (chronic obstructive pulmonary disease) (HCC)   Type 2 diabetes mellitus, without long-term current use of insulin  (HCC)   Prolonged QT interval   RECOMMENDATIONS FOR OUTPATIENT FOLLOW UP: Outpatient follow-up with PCP Outpatient follow-up with orthopedics    Home Health: Outpatient therapy Equipment/Devices: None  CODE STATUS: Full code  DISCHARGE CONDITION: fair  Diet recommendation: As before  INITIAL HISTORY: 58 y.o. male with medical history significant of hypertension, DM type 2,  OSA on CPAP, moderate COPD, GERD, hyperlipidemia, BPH associated with nocturia, chronic diastolic heart failure, restless leg syndrome and history of substance presented with altered mental status.  On presentation, he was hypertensive, tachycardic and afebrile.  WBC of 15.8.  Potassium 3.1 and creatinine of 1.72.  COVID/influenza/RSV PCR negative.  Chest x-ray showed no acute cardiopulmonary findings.  Urinalysis was negative.  He was started on broad-spectrum antibiotics.     HOSPITAL COURSE:   Acute metabolic versus toxic encephalopathy - Possibly from polypharmacy.  Workup negative so far including negative CT of the head, chest x-ray and urinalysis. Sepsis has been ruled out. - Ammonia and TSH were normal.  B12 529, folic acid  11.9.   - Patient was initially started on antibiotic for meningitis coverage although LP was not pursued.  Since mentation improved rapidly and likelihood of meningeal infection is very low antibiotics were discontinued.   Patient's mentation is now back to baseline.     Right foot pain Noted to have bruising along his 2nd, 3rd and 4th toe  this morning.  He is complaining of pain in that foot.  No history of trauma to that foot.  He has very good dorsalis pedis pulses.  The foot is warm to touch.  Do not suspect any vascular etiology at this time.  X-rays showed fracture of the base of the fourth proximal phalanx.  Discussed with orthopedics who recommended a boot and nonweightbearing.  Outpatient follow-up with orthopedics.     Hypokalemia  Hypertensive urgency Blood pressure was poorly controlled.  He was continued on his oral agents.  Dose of hydralazine  increased.     History of substance abuse - Urine drug screen was positive for benzodiazepines.   CKD stage IIIb - Creatinine at baseline.     COPD -Currently stable.   Obesity class III - Outpatient follow-up   Physical deconditioning PT eval   BPH Continue tamsulosin    Anxiety and depression Continue alprazolam  as needed   Prolonged QT -Continue telemetry monitoring.  Replace electrolytes as needed     Patient is stable.  Okay for discharge home today.  Outpatient therapy has been ordered.   PERTINENT LABS:  The results of significant diagnostics from this hospitalization (including imaging, microbiology, ancillary and laboratory) are listed below for reference.    Microbiology: Recent Results (from the past 240 hours)  Culture, blood (Routine x 2)     Status: None (Preliminary result)   Collection Time: 05/19/24  9:58 PM   Specimen: BLOOD RIGHT ARM  Result Value Ref Range Status   Specimen Description   Final    BLOOD RIGHT ARM Performed at [facility], [address] [place], [place] [address]    Special Requests  Final    BOTTLES DRAWN AEROBIC AND ANAEROBIC Blood Culture results Adolph not be optimal due to an inadequate volume of blood received in culture bottles Performed at [facility], [address], [place], [place] [address]    Culture   Final    NO GROWTH 4 DAYS Performed at [facility], [address], [place], [place] [address]    Report Status PENDING  Incomplete  Culture, blood (Routine x 2)     Status: None (Preliminary result)   Collection Time: 05/19/24  9:58 PM   Specimen: BLOOD RIGHT ARM  Result Value Ref Range Status   Specimen Description   Final    BLOOD RIGHT ARM Performed at [facility], [address] [place], [place] [address]    Special Requests   Final    BOTTLES DRAWN AEROBIC AND ANAEROBIC Blood Culture results Crail not be optimal due to an inadequate volume of blood received in culture bottles Performed at [facility], [address], [place], [place] [address]    Culture   Final    NO GROWTH 4 DAYS Performed at [facility], [address] [place], [place] [address]    Report Status PENDING  Incomplete  Resp panel by RT-PCR (RSV, Flu A&B, Covid) Anterior Nasal Swab     Status: None   Collection Time: 05/20/24 12:00 AM   Specimen: Anterior Nasal Swab  Result Value Ref Range Status   SARS Coronavirus 2 by RT PCR NEGATIVE NEGATIVE Final    Comment: (NOTE) SARS-CoV-2 target nucleic acids are NOT DETECTED.  The SARS-CoV-2 RNA is generally detectable in upper respiratory specimens during the acute phase of infection. The lowest concentration of SARS-CoV-2 viral copies this assay can detect is 138 copies/mL. A negative result does not preclude SARS-Cov-2 infection and should not be used as the sole basis for treatment or other patient management decisions. A negative result Sanfilippo occur with  improper specimen collection/handling, submission of specimen other than nasopharyngeal swab, presence of viral mutation(s) within the areas targeted by this assay, and inadequate number of viral copies(<138 copies/mL). A negative result must be combined with clinical observations, patient history, and epidemiological information. The expected result is Negative.  Fact Sheet for Patients:   bloggercourse.com  Fact Sheet for Healthcare Providers:  seriousbroker.it  This test is no t yet approved or cleared by the [place]  FDA and  has been authorized for detection and/or diagnosis of SARS-CoV-2 by FDA under an Emergency Use Authorization (EUA). This EUA will remain  in effect (meaning this test can be used) for the duration of the COVID-19 declaration under Section 564(b)(1) of the Act, 21 U.S.C.section 360bbb-3(b)(1), unless the authorization is terminated  or revoked sooner.       Influenza A by PCR NEGATIVE NEGATIVE Final   Influenza B by PCR NEGATIVE NEGATIVE Final    Comment: (NOTE) The Xpert Xpress SARS-CoV-2/FLU/RSV plus assay is intended as an aid in the diagnosis of influenza from Nasopharyngeal swab specimens and should not be used as a sole basis for treatment. Nasal washings and aspirates are unacceptable for Xpert Xpress SARS-CoV-2/FLU/RSV testing.  Fact Sheet for Patients: bloggercourse.com  Fact Sheet for Healthcare Providers: seriousbroker.it  This test is not yet approved or cleared by the [place]  FDA and has been authorized for detection and/or diagnosis of SARS-CoV-2 by FDA under an Emergency Use Authorization (EUA). This EUA will remain in effect (meaning this test can be  used) for the duration of the COVID-19 declaration under Section 564(b)(1) of the Act, 21 U.S.C. section 360bbb-3(b)(1), unless the authorization is terminated or revoked.     Resp Syncytial Virus by PCR NEGATIVE NEGATIVE Final    Comment: (NOTE) Fact Sheet for Patients: bloggercourse.com  Fact Sheet for Healthcare Providers: seriousbroker.it  This test is not yet approved or cleared by the [place]  FDA and has been authorized for detection and/or diagnosis of SARS-CoV-2 by FDA under an Emergency Use  Authorization (EUA). This EUA will remain in effect (meaning this test can be used) for the duration of the COVID-19 declaration under Section 564(b)(1) of the Act, 21 U.S.C. section 360bbb-3(b)(1), unless the authorization is terminated or revoked.  Performed at [facility], [address], [place], [place] [address]      Labs:   Basic Metabolic Panel: Recent Labs  Lab 05/19/24 2158 05/20/24 0218 05/21/24 0142 05/22/24 0346 05/23/24 0311  NA 142  --   --  140 135  K 3.1*  --   --  3.1* 3.6  CL 100  --   --  102 98  CO2 31  --   --  29 26  GLUCOSE 136*  --   --  117* 125*  BUN 39*  --   --  17 18  CREATININE 1.72* 1.57* 1.48* 1.26* 1.37*  CALCIUM  10.0  --   --  9.2 9.3  MG  --   --   --  1.9 2.2   Liver Function Tests: Recent Labs  Lab 05/19/24 2158 05/22/24 0346  AST 17 13*  ALT 17 13  ALKPHOS 111 88  BILITOT 0.4 0.5  PROT 8.1 7.0  ALBUMIN  4.2 3.5    Recent Labs  Lab 05/20/24 0217  AMMONIA 19   CBC: Recent Labs  Lab 05/19/24 2158 05/20/24 0621 05/22/24 0346 05/23/24 0311  WBC 15.8* 11.8* 11.4* 11.6*  NEUTROABS 12.6*  --  7.9*  --   HGB 12.4* 12.4* 11.5* 11.0*  HCT 37.8* 38.2* 35.4* 33.6*  MCV 89.6 90.3 89.8 90.1  PLT 342 295 284 273    CBG: Recent Labs  Lab 05/22/24 1123 05/22/24 1656 05/22/24 2047 05/23/24 0733 05/23/24 1137  GLUCAP 157* 139* 149* 131* 159*     IMAGING STUDIES DG Foot Complete Right Result Date: 05/22/2024 CLINICAL DATA:  Bruising and pain of the right foot. EXAM: RIGHT FOOT COMPLETE - 3+ VIEW COMPARISON:  None Available. FINDINGS: Age indeterminate fracture of the base of the proximal phalanx of the fourth digit. Correlation with clinical exam and point tenderness recommended. Old healed fracture of the fifth metatarsal. The bones are osteopenic. No dislocation. Soft tissue edema of the dorsum of the foot. No opaque foreign object or soft tissue gas. IMPRESSION: Age indeterminate fracture of the  base of the proximal phalanx of the fourth digit. Correlation with clinical exam and point tenderness recommended. Electronically Signed   By: Vanetta Chou M.D.   On: 05/22/2024 12:31   CT Head Wo Contrast Result Date: 05/20/2024 EXAM: CT HEAD WITHOUT CONTRAST 05/20/2024 02:54:41 AM TECHNIQUE: CT of the head was performed without the administration of intravenous contrast. Automated exposure control, iterative reconstruction, and/or weight based adjustment of the mA/kV was utilized to reduce the radiation dose to as low as reasonably achievable. COMPARISON: 03/30/2024 CLINICAL HISTORY: Mental status change, unknown cause. FINDINGS: LIMITATIONS: Motion limited study. BRAIN AND VENTRICLES: No acute hemorrhage. No evidence of acute infarct. No extra-axial collection. No mass effect or  midline shift. ORBITS: No acute abnormality. SINUSES: No acute abnormality. SOFT TISSUES AND SKULL: No acute soft tissue abnormality. No skull fracture. IMPRESSION: 1. No acute intracranial abnormality. 2. Evaluation limited by motion artifact. Electronically signed by: Oneil Devonshire MD 05/20/2024 03:15 AM EST RP Workstation: [id number]   DG Chest Port 1 View if patient is in a treatment room. Result Date: 05/19/2024 EXAM: 1 VIEW(S) XRAY OF THE CHEST 05/19/2024 10:10:00 PM COMPARISON: 03/30/2024 CLINICAL HISTORY: Suspected Sepsis FINDINGS: LUNGS AND PLEURA: Lower lung volumes. No focal pulmonary opacity. No pleural effusion. No pneumothorax. HEART AND MEDIASTINUM: Stable cardiomegaly. BONES AND SOFT TISSUES: No acute osseous abnormality. IMPRESSION: 1. No acute findings. 2. Stable cardiomegaly. Electronically signed by: Greig Pique MD 05/19/2024 10:17 PM EST RP Workstation: [id number]    DISCHARGE EXAMINATION: Vitals:   05/22/24 2248 05/23/24 0500 05/23/24 0509 05/23/24 1042  BP:   (!) 168/80 (!) 164/73  Pulse: 79  72   Resp:      Temp:   97.7 F (36.5 C)   TempSrc:   Oral   SpO2:   98%   Weight:  (!) 139 kg     Height:       General appearance: Awake alert.  In no distress Resp: Clear to auscultation bilaterally.  Normal effort Cardio: S1-S2 is normal regular.  No S3-S4.  No rubs murmurs or bruit GI: Abdomen is soft.  Nontender nondistended.  Bowel sounds are present normal.  No masses organomegaly  DISPOSITION: Home  Discharge Instructions     Call MD for:  difficulty breathing, headache or visual disturbances   Complete by: As directed    Call MD for:  extreme fatigue   Complete by: As directed    Call MD for:  persistant dizziness or light-headedness   Complete by: As directed    Call MD for:  persistant nausea and vomiting   Complete by: As directed    Call MD for:  severe uncontrolled pain   Complete by: As directed    Call MD for:  temperature >100.4   Complete by: As directed    Diet - low sodium heart healthy   Complete by: As directed    Discharge instructions   Complete by: As directed    Please be sure to follow-up with orthopedics for the right foot injury.  Their contact information is on your discharge paperwork.  Monitor your blood pressures at home and seek attention if readings are greater than 150s systolic (the top number).  You were cared for by a hospitalist during your hospital stay. If you have any questions about your discharge medications or the care you received while you were in the hospital after you are discharged, you can call the unit and asked to speak with the hospitalist on call if the hospitalist that took care of you is not available. Once you are discharged, your primary care physician will handle any further medical issues. Please note that NO REFILLS for any discharge medications will be authorized once you are discharged, as it is imperative that you return to your primary care physician (or establish a relationship with a primary care physician if you do not have one) for your aftercare needs so that they can reassess your need for medications and  monitor your lab values. If you do not have a primary care physician, you can call [phone] for a physician referral.   Increase activity slowly   Complete by: As directed  Allergies as of 05/23/2024       Reactions   Haldol  [haloperidol ] Other (See Comments)   Makes drowsy   Morphine  Other (See Comments)   Mental status changes - angry, mean   Sulfa  Antibiotics Hives   Other Reaction(s): Unknown        Medication List     STOP taking these medications    diphenhydrAMINE  25 mg capsule Commonly known as: BENADRYL    promethazine  12.5 MG tablet Commonly known as: PHENERGAN        TAKE these medications    pramipexole  0.5 MG tablet Commonly known as: MIRAPEX  Take 1 tablet (0.5 mg total) by mouth in the morning and at bedtime. The timing of this medication is very important.   albuterol  108 (90 Base) MCG/ACT inhaler Commonly known as: VENTOLIN  HFA Inhale 2 puffs into the lungs every 6 (six) hours as needed for wheezing or shortness of breath.   allopurinol  300 MG tablet Commonly known as: ZYLOPRIM  Take 1 tablet (300 mg total) by mouth daily.   ALPRAZolam  0.5 MG tablet Commonly known as: XANAX  Take 1 tablet (0.5 mg total) by mouth 2 (two) times daily as needed.   amLODipine  10 MG tablet Commonly known as: NORVASC  Take 10 mg by mouth daily.   aspirin  EC 81 MG tablet Take 81 mg by mouth daily. Swallow whole.   atorvastatin  40 MG tablet Commonly known as: LIPITOR Take 1 tablet (40 mg total) by mouth every evening.   baclofen  20 MG tablet Commonly known as: LIORESAL  Take 1 tablet (20 mg total) by mouth 3 (three) times daily.   carvedilol  25 MG tablet Commonly known as: COREG  Take 1 tablet (25 mg total) by mouth in the morning and at bedtime.   diclofenac  Sodium 1 % Gel Commonly known as: VOLTAREN  Apply 4 g topically 4 (four) times daily. To bilateral knees What changed:  when to take this reasons to take this   doxazosin  2 MG  tablet Commonly known as: CARDURA  Take 1 tablet (2 mg total) by mouth at bedtime. What changed: when to take this   DULoxetine  60 MG capsule Commonly known as: CYMBALTA  Take 60 mg by mouth daily.   furosemide  80 MG tablet Commonly known as: LASIX  Take 160 mg by mouth 2 (two) times daily.   hydrALAZINE  50 MG tablet Commonly known as: APRESOLINE  Take 1 tablet (50 mg total) by mouth 3 (three) times daily. What changed:  medication strength how much to take   lidocaine  5 % Commonly known as: LIDODERM  Place 2 patches onto the skin daily at 10 pm. Remove & Discard patch within 12 hours or as directed by MD What changed:  when to take this reasons to take this   metolazone 2.5 MG tablet Commonly known as: ZAROXOLYN Take 2.5 mg by mouth as needed (only when Lasix  does not work).   omeprazole 40 MG capsule Commonly known as: PRILOSEC Take 40 mg by mouth daily.   ondansetron  4 MG tablet Commonly known as: Zofran  Take 1 tablet (4 mg total) by mouth every 8 (eight) hours as needed for nausea or vomiting.   oxyCODONE -acetaminophen  10-325 MG tablet Commonly known as: Percocet Take 1 tablet by mouth every 6 (six) hours as needed for pain. 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 What changed:  when to take this reasons to take this   Potassium Chloride   ER 20 MEQ Tbcr Take 2 tablets by mouth in the morning and at bedtime.   tirzepatide  5 MG/0.5ML Pen Commonly known as: MOUNJARO  Inject 5 mg into the skin once a week. What changed: additional instructions   tiZANidine  4 MG tablet Commonly known as: Zanaflex  Take 1 tablet (4 mg total) by mouth 3 (three) times daily. - for spasticity with baclofen    Vitamin D  (Ergocalciferol ) 1.25 MG (50000 UNIT) Caps capsule Commonly known as: DRISDOL  Take 1 capsule (50,000 Units total) by mouth every 7 (seven) days. What  changed: additional instructions          Follow-up Information     Barton Drape, MD. Schedule an appointment as soon as possible for a visit in 1 week(s).   Specialty: Orthopedic Surgery Why: post hospitalization follow up for right foot Contact information: [address] [place] [address] [phone]                 TOTAL DISCHARGE TIME: 35 minutes  Jorge Retz Verdene  [facility] Pager on [email or web address]  05/24/2024, 11:56 AM    "

## 2024-05-23 NOTE — TOC Progression Note (Signed)
 Transition of Care [facility]) - Progression Note    Patient Details  Name: Raymond [name] MRN: [id number] Date of Birth: 1966-02-21  Transition of Care [facility]) CM/SW Contact  Camelia JONETTA Cary, RN Phone Number: 05/23/2024, 11:38 AM  Clinical Narrative:   Spoke to patient regarding d/c plan. Referral faxed to current outpatient PT [facility] fax [phone] tele number [phone]. No further needs from CM. Will continue to follow up. Patient will return home. Spouse will transport.    Expected Discharge Plan: Home/Self Care Barriers to Discharge: No Barriers Identified               Expected Discharge Plan and Services In-house Referral: Clinical Social Work Discharge Planning Services: CM Consult Post Acute Care Choice:  (otpt PT) Living arrangements for the past 2 months: Single Family Home Expected Discharge Date: 05/23/24                                     Social Drivers of Health (SDOH) Interventions SDOH Screenings   Food Insecurity: No Food Insecurity (05/20/2024)  Housing: Low Risk (05/20/2024)  Transportation Needs: No Transportation Needs (05/20/2024)  Utilities: Not At Risk (05/20/2024)  Depression (PHQ2-9): Low Risk (04/27/2024)  Stress: No Stress Concern Present (03/21/2022)   Received from [facility]  Tobacco Use: Medium Risk (05/19/2024)    Readmission Risk Interventions    05/23/2024   11:14 AM 01/22/2024   10:22 AM 12/25/2022    4:24 PM  Readmission Risk Prevention Plan  Post Dischage Appt   Complete  Medication Screening   Complete  Transportation Screening Complete Complete Complete  PCP or Specialist Appt within 3-5 Days Complete Complete   HRI or Home Care Consult Complete Complete   Social Work Consult for Recovery Care Planning/Counseling Complete Complete   Palliative Care Screening Not Applicable Complete   Medication Review Ocean[employer]) Complete Complete

## 2024-05-23 NOTE — Discharge Instructions (Signed)
 Resources added to provide education around substance use and treatment.

## 2024-05-23 NOTE — Progress Notes (Signed)
 Discharge instructions given to patient questoins asked and answered

## 2024-05-23 NOTE — Progress Notes (Signed)
 Discharge medication delivered to patient a the bedside

## 2024-05-24 NOTE — Progress Notes (Signed)
 AVS printed

## 2024-05-25 LAB — CULTURE, BLOOD (ROUTINE X 2)
Culture: NO GROWTH
Culture: NO GROWTH

## 2024-05-27 ENCOUNTER — Encounter (HOSPITAL_COMMUNITY): Payer: Self-pay

## 2024-05-27 ENCOUNTER — Emergency Department (HOSPITAL_COMMUNITY): Payer: PRIVATE HEALTH INSURANCE

## 2024-05-27 ENCOUNTER — Emergency Department (HOSPITAL_COMMUNITY)
Admission: EM | Admit: 2024-05-27 | Discharge: 2024-05-27 | Disposition: A | Discharge status: Home / Self Care | Payer: PRIVATE HEALTH INSURANCE | Attending: Emergency Medicine | Admitting: Emergency Medicine

## 2024-05-27 ENCOUNTER — Other Ambulatory Visit: Payer: Self-pay

## 2024-05-27 DIAGNOSIS — Z7982 Long term (current) use of aspirin: Secondary | ICD-10-CM | POA: Insufficient documentation

## 2024-05-27 DIAGNOSIS — Z79899 Other long term (current) drug therapy: Secondary | ICD-10-CM | POA: Diagnosis not present

## 2024-05-27 DIAGNOSIS — R55 Syncope and collapse: Secondary | ICD-10-CM | POA: Diagnosis present

## 2024-05-27 DIAGNOSIS — J449 Chronic obstructive pulmonary disease, unspecified: Secondary | ICD-10-CM | POA: Diagnosis not present

## 2024-05-27 DIAGNOSIS — R001 Bradycardia, unspecified: Secondary | ICD-10-CM | POA: Insufficient documentation

## 2024-05-27 DIAGNOSIS — E119 Type 2 diabetes mellitus without complications: Secondary | ICD-10-CM | POA: Insufficient documentation

## 2024-05-27 LAB — COMPREHENSIVE METABOLIC PANEL WITH GFR
ALT: 16 U/L (ref 0–44)
AST: 22 U/L (ref 15–41)
Albumin: 4 g/dL (ref 3.5–5.0)
Alkaline Phosphatase: 125 U/L (ref 38–126)
Anion gap: 11 (ref 5–15)
BUN: 30 mg/dL — ABNORMAL HIGH (ref 6–20)
CO2: 31 mmol/L (ref 22–32)
Calcium: 9.6 mg/dL (ref 8.9–10.3)
Chloride: 98 mmol/L (ref 98–111)
Creatinine, Ser: 1.48 mg/dL — ABNORMAL HIGH (ref 0.61–1.24)
GFR, Estimated: 54 mL/min — ABNORMAL LOW
Glucose, Bld: 79 mg/dL (ref 70–99)
Potassium: 3.5 mmol/L (ref 3.5–5.1)
Sodium: 140 mmol/L (ref 135–145)
Total Bilirubin: 0.3 mg/dL (ref 0.0–1.2)
Total Protein: 8 g/dL (ref 6.5–8.1)

## 2024-05-27 LAB — CBC WITH DIFFERENTIAL/PLATELET
Abs Immature Granulocytes: 0.12 K/uL — ABNORMAL HIGH (ref 0.00–0.07)
Basophils Absolute: 0.1 K/uL (ref 0.0–0.1)
Basophils Relative: 1 %
Eosinophils Absolute: 0.5 K/uL (ref 0.0–0.5)
Eosinophils Relative: 4 %
HCT: 36.6 % — ABNORMAL LOW (ref 39.0–52.0)
Hemoglobin: 11.7 g/dL — ABNORMAL LOW (ref 13.0–17.0)
Immature Granulocytes: 1 %
Lymphocytes Relative: 17 %
Lymphs Abs: 2.2 K/uL (ref 0.7–4.0)
MCH: 29.3 pg (ref 26.0–34.0)
MCHC: 32 g/dL (ref 30.0–36.0)
MCV: 91.5 fL (ref 80.0–100.0)
Monocytes Absolute: 0.9 K/uL (ref 0.1–1.0)
Monocytes Relative: 7 %
Neutro Abs: 9 K/uL — ABNORMAL HIGH (ref 1.7–7.7)
Neutrophils Relative %: 70 %
Platelets: 286 K/uL (ref 150–400)
RBC: 4 MIL/uL — ABNORMAL LOW (ref 4.22–5.81)
RDW: 15.9 % — ABNORMAL HIGH (ref 11.5–15.5)
WBC: 12.8 K/uL — ABNORMAL HIGH (ref 4.0–10.5)
nRBC: 0 % (ref 0.0–0.2)

## 2024-05-27 LAB — BLOOD GAS, VENOUS
Acid-Base Excess: 12.4 mmol/L — ABNORMAL HIGH (ref 0.0–2.0)
Bicarbonate: 39.6 mmol/L — ABNORMAL HIGH (ref 20.0–28.0)
O2 Saturation: 50.2 %
Patient temperature: 37
pCO2, Ven: 61 mmHg — ABNORMAL HIGH (ref 44–60)
pH, Ven: 7.42 (ref 7.25–7.43)
pO2, Ven: <31 mmHg — CL (ref 32–45)

## 2024-05-27 LAB — TROPONIN T, HIGH SENSITIVITY
Troponin T High Sensitivity: 37 ng/L — ABNORMAL HIGH (ref 0–19)
Troponin T High Sensitivity: 37 ng/L — ABNORMAL HIGH (ref 0–19)

## 2024-05-27 LAB — CBG MONITORING, ED: Glucose-Capillary: 84 mg/dL (ref 70–99)

## 2024-05-27 LAB — ETHANOL: Alcohol, Ethyl (B): <15 mg/dL

## 2024-05-27 LAB — AMMONIA: Ammonia: 23 umol/L (ref 9–35)

## 2024-05-27 MED ORDER — OXYCODONE-ACETAMINOPHEN 5-325 MG PO TABS
2.0000 | ORAL_TABLET | Freq: Once | ORAL | Status: AC
  Administered 2024-05-27: 2 via ORAL
  Filled 2024-05-27: qty 2

## 2024-05-27 MED ORDER — BACLOFEN 10 MG PO TABS
20.0000 mg | ORAL_TABLET | Freq: Once | ORAL | Status: AC
  Administered 2024-05-27: 20 mg via ORAL
  Filled 2024-05-27: qty 2

## 2024-05-27 NOTE — ED Notes (Signed)
 VBG/Ethanol/CBC/CMP resent to lab after VBG lid came off in tube, contaminating other samples.

## 2024-05-27 NOTE — ED Triage Notes (Signed)
 Pt BIBA from physical therapy.  While pt was performing PT pt had a syncopal episode,  was eased to ground by therapist,  No injury.  CBG was low  PT gave OJ and glucose, CBG rebounded to 128 with EMS  all other vitals within range.    PT is A&O x 4  NAD

## 2024-05-27 NOTE — Discharge Instructions (Signed)
 All your blood work looks okay today.  They did say your blood sugar was low at physical therapy but it is normal now.  Everything with your kidneys and your heart look okay.  I think what happened today Munley be related to your medication so it will be a good idea to follow-up with your doctor.

## 2024-05-27 NOTE — ED Provider Notes (Signed)
 " Raymond [name] EMERGENCY DEPARTMENT AT [facility] Provider Note   CSN: [id number] Arrival date & time: 05/27/24  1540     Patient presents with: Loss of Consciousness   Raymond [name] is a 58 y.o. male.   Pt is a 58y/o male with hx of hypertension, DM type 2,  OSA on CPAP, moderate COPD, GERD, hyperlipidemia, BPH associated with nocturia, chronic diastolic heart failure, restless leg syndrome and history of substance and recent admission for AMS thought to be from polypharmacy who is presenting today after a syncopal event at PT. patient reports that he currently is in a wheelchair chronically but going to physical therapy to strengthen his legs and be able to walk again.  He reports feeling well today when he woke up and when he got to physical therapy.  He states he had not been on the bike long when he started feeling lightheaded and dizzy.  Physical therapy reported that he had a syncopal event while the therapist was next to him and she helped lower him to the ground and he had no injury.  They reported they checked a CBG and it read low and they gave the patient orange juice and glucose with a repeat sugar of 128.  Patient reports he has had no chest pain or shortness of breath.  He reports just feeling very drowsy.  He has not had any new swelling or pain in his legs.  He has been having normal bowel movements and urinating well.  He has not had fever, cough.  Patient reports that he was taken off Mounjaro  a while ago and since that time has not gone back on any diabetic medications.  He did eat this morning.  The history is provided by the patient, the EMS personnel and medical records.  Loss of Consciousness      Prior to Admission medications  Medication Sig Start Date End Date Taking? Authorizing Provider  albuterol  (VENTOLIN  HFA) 108 (90 Base) MCG/ACT inhaler Inhale 2 puffs into the lungs every 6 (six) hours as needed for wheezing or shortness of breath. 05/12/23   Raulkar,  Sven SQUIBB, MD  allopurinol  (ZYLOPRIM ) 300 MG tablet Take 1 tablet (300 mg total) by mouth daily. 05/12/23   Raulkar, Sven SQUIBB, MD  ALPRAZolam  (XANAX ) 0.5 MG tablet Take 1 tablet (0.5 mg total) by mouth 2 (two) times daily as needed. 05/12/23   Raulkar, Sven SQUIBB, MD  amLODipine  (NORVASC ) 10 MG tablet Take 10 mg by mouth daily. 07/20/23   [provider]  aspirin  EC 81 MG tablet Take 81 mg by mouth daily. Swallow whole.    [provider]  atorvastatin  (LIPITOR) 40 MG tablet Take 1 tablet (40 mg total) by mouth every evening. 05/12/23   Raulkar, Sven SQUIBB, MD  baclofen  (LIORESAL ) 20 MG tablet Take 1 tablet (20 mg total) by mouth 3 (three) times daily. 04/27/24   Lovorn, Megan, MD  carvedilol  (COREG ) 25 MG tablet Take 1 tablet (25 mg total) by mouth in the morning and at bedtime. 05/12/23   Raulkar, Sven SQUIBB, MD  diclofenac  Sodium (VOLTAREN ) 1 % GEL Apply 4 g topically 4 (four) times daily. To bilateral knees Patient taking differently: Apply 4 g topically as needed. To bilateral knees 05/12/23   Raulkar, Sven SQUIBB, MD  doxazosin  (CARDURA ) 2 MG tablet Take 1 tablet (2 mg total) by mouth at bedtime. Patient taking differently: Take 2 mg by mouth 2 (two) times daily. 05/12/23   Raulkar, Sven SQUIBB,  MD  DULoxetine  (CYMBALTA ) 60 MG capsule Take 60 mg by mouth daily.    [provider]  furosemide  (LASIX ) 80 MG tablet Take 160 mg by mouth 2 (two) times daily.    [provider]  hydrALAZINE  (APRESOLINE ) 50 MG tablet Take 1 tablet (50 mg total) by mouth 3 (three) times daily. 05/23/24   Verdene Purchase, MD  lidocaine  (LIDODERM ) 5 % Place 2 patches onto the skin daily at 10 pm. Remove & Discard patch within 12 hours or as directed by MD Patient taking differently: Place 2 patches onto the skin as needed. Remove & Discard patch within 12 hours or as directed by MD 05/12/23   Raulkar, Sven SQUIBB, MD  metolazone (ZAROXOLYN) 2.5 MG tablet Take 2.5 mg by mouth as needed (only  when Lasix  does not work).    [provider]  omeprazole (PRILOSEC) 40 MG capsule Take 40 mg by mouth daily. 07/02/23   [provider]  ondansetron  (ZOFRAN ) 4 MG tablet Take 1 tablet (4 mg total) by mouth every 8 (eight) hours as needed for nausea or vomiting. 04/18/24   Delores Shields A, DO  oxyCODONE -acetaminophen  (PERCOCET) 10-325 MG tablet Take 1 tablet by mouth every 6 (six) hours as needed for pain. 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 Patient taking differently: Take 17 g by mouth daily as needed for mild constipation. 03/23/24   Yolande Lamar BROCKS, MD  Potassium Chloride  ER 20 MEQ TBCR Take 2 tablets by mouth in the morning and at bedtime.    [provider]  pramipexole  (MIRAPEX ) 0.5 MG tablet Take 1 tablet (0.5 mg total) by mouth in the morning and at bedtime. 05/12/23   Raulkar, Sven SQUIBB, MD  tirzepatide  (MOUNJARO ) 5 MG/0.5ML Pen Inject 5 mg into the skin once a week. Patient taking differently: Inject 5 mg into the skin once a week. On Thursdays 05/16/24   Delores Shields A, DO  tiZANidine  (ZANAFLEX ) 4 MG tablet Take 1 tablet (4 mg total) by mouth 3 (three) times daily. - for spasticity with baclofen  04/27/24   Lovorn, Megan, MD  Vitamin D , Ergocalciferol , (DRISDOL ) 1.25 MG (50000 UNIT) CAPS capsule Take 1 capsule (50,000 Units total) by mouth every 7 (seven) days. Patient taking differently: Take 50,000 Units by mouth every 7 (seven) days. On Mondays 05/16/24   Delores Shields LABOR, DO    Allergies: Haldol  [haloperidol ], Morphine , and Sulfa  antibiotics    Review of Systems  Cardiovascular:  Positive for syncope.    Updated Vital Signs BP (!) 175/83   Pulse (!) 54   Temp 97.7 F (36.5 C) (Oral)   Resp (!) 54   SpO2 100%   Physical Exam Vitals and nursing note reviewed.  Constitutional:      General: He is not in acute distress.     Appearance: He is well-developed.     Comments: Patient is alert when I walk in and talk with him however he occasionally will fall asleep midsentence  HENT:     Head: Normocephalic and atraumatic.  Eyes:     Conjunctiva/sclera: Conjunctivae normal.     Pupils: Pupils are equal, round, and reactive to light.  Cardiovascular:     Rate and Rhythm: Regular rhythm. Bradycardia present.     Heart sounds: No murmur heard. Pulmonary:     Effort: Pulmonary effort is normal. No  respiratory distress.     Breath sounds: Normal breath sounds. No wheezing or rales.  Abdominal:     General: There is no distension.     Palpations: Abdomen is soft.     Tenderness: There is no abdominal tenderness. There is no guarding or rebound.  Musculoskeletal:        General: No tenderness. Normal range of motion.     Cervical back: Normal range of motion and neck supple.     Comments: Trace edema bilateral ankles  Skin:    General: Skin is warm and dry.     Findings: No erythema or rash.  Neurological:     Mental Status: He is alert and oriented to person, place, and time. Mental status is at baseline.  Psychiatric:        Mood and Affect: Mood normal.        Behavior: Behavior normal.     (all labs ordered are listed, but only abnormal results are displayed) Labs Reviewed  CBC WITH DIFFERENTIAL/PLATELET - Abnormal; Notable for the following components:      Result Value   WBC 12.8 (*)    RBC 4.00 (*)    Hemoglobin 11.7 (*)    HCT 36.6 (*)    RDW 15.9 (*)    Neutro Abs 9.0 (*)    Abs Immature Granulocytes 0.12 (*)    All other components within normal limits  COMPREHENSIVE METABOLIC PANEL WITH GFR - Abnormal; Notable for the following components:   BUN 30 (*)    Creatinine, Ser 1.48 (*)    GFR, Estimated 54 (*)    All other components within normal limits  BLOOD GAS, VENOUS - Abnormal; Notable for the following components:   pCO2, Ven 61 (*)    pO2, Ven <31 (*)    Bicarbonate 39.6 (*)     Acid-Base Excess 12.4 (*)    All other components within normal limits  TROPONIN T, HIGH SENSITIVITY - Abnormal; Notable for the following components:   Troponin T High Sensitivity 37 (*)    All other components within normal limits  TROPONIN T, HIGH SENSITIVITY - Abnormal; Notable for the following components:   Troponin T High Sensitivity 37 (*)    All other components within normal limits  AMMONIA  ETHANOL  CBG MONITORING, ED    EKG: EKG Interpretation Date/Time:  Friday May 27 2024 16:02:47 EST Ventricular Rate:  55 PR Interval:  198 QRS Duration:  116 QT Interval:  459 QTC Calculation: 439 R Axis:   105  Text Interpretation: Right and left arm electrode reversal, interpretation assumes no reversal Sinus rhythm Nonspecific intraventricular conduction delay Low voltage, precordial leads Confirmed by Doretha Folks ([id number]) on 05/27/2024 4:32:34 PM  Radiology: [facility] Chest Port 1 View Result Date: 05/27/2024 CLINICAL DATA:  Syncope. EXAM: PORTABLE CHEST 1 VIEW COMPARISON:  05/19/2024. FINDINGS: Stable cardiomegaly. No overt pulmonary edema, focal consolidation, sizeable pleural effusion, or pneumothorax. No acute osseous abnormality. IMPRESSION: 1. No acute cardiopulmonary findings. 2. Stable cardiomegaly. Electronically Signed   By: Harrietta Sherry M.D.   On: 05/27/2024 17:46     Procedures   Medications Ordered in the ED  oxyCODONE -acetaminophen  (PERCOCET/ROXICET) 5-325 MG per tablet 2 tablet (2 tablets Oral Given 05/27/24 2008)  baclofen  (LIORESAL ) tablet 20 mg (20 mg Oral Given 05/27/24 2008)  Medical Decision Making Amount and/or Complexity of Data Reviewed Labs: ordered. Decision-making details documented in ED Course. Radiology: ordered and independent interpretation performed. Decision-making details documented in ED Course. ECG/medicine tests: ordered and independent interpretation performed. Decision-making details  documented in ED Course.  Risk Prescription drug management.   Pt with multiple medical problems and comorbidities and presenting today with a complaint that caries a high risk for morbidity and mortality.  Here today with the above complaints.  Patient did have prodromal symptoms of feeling dizzy and presyncopal before the event.  He reports now just feeling very tired and not quite back to himself.  Patient was reported to have a low blood sugar during that time however after receiving medications his repeat blood sugar was 128 and on repeat here was in the 80s.  He takes no diabetic medications.  Can concern for possible electrolyte abnormalities, AKI.  Patient does seem drowsy on exam and was recently hospitalized for altered mental status thought to be related to polypharmacy which also could be an issue today however lower suspicion for ACS, dysrhythmia, PE. I independently interpreted patient's labs and EKG.  EKG shows a sinus bradycardia at 55 without significant ST changes.  No dysrhythmia noted.  CBC within normal limits, blood gas with normal pH and compensated respiratory acidosis with a CO2 of 61 but a bicarb of 40, CBC with minimal leukocytosis of 12 but no other acute findings, CMP is at baseline, troponins are 37 x 2, ammonia is normal and blood sugar has been stable.  I have independently visualized and interpreted pt's images today.  Chest x-ray without acute findings.  On repeat evaluation patient has not eaten he is wide-awake his wife who is here at bedside.  Discussed with him that symptoms Carew be related to polypharmacy as he was very drowsy when he got here but now is much more lucid.  He has an appointment with his doctor on Tuesday and can discuss medication changes.  At this time no indication for admission.  Feel that patient is stable for discharge.  He is comfortable with this plan.     Final diagnoses:  Syncope and collapse    ED Discharge Orders     None           Doretha Folks, MD 05/27/24 2257  "

## 2024-06-09 ENCOUNTER — Emergency Department (HOSPITAL_COMMUNITY)

## 2024-06-09 ENCOUNTER — Inpatient Hospital Stay (HOSPITAL_COMMUNITY)
Admission: EM | Admit: 2024-06-09 | Discharge: 2024-06-13 | DRG: 917 | Disposition: A | Discharge status: Home / Self Care | Attending: Family Medicine | Admitting: Family Medicine

## 2024-06-09 ENCOUNTER — Other Ambulatory Visit: Payer: Self-pay

## 2024-06-09 DIAGNOSIS — E7220 Disorder of urea cycle metabolism, unspecified: Secondary | ICD-10-CM | POA: Diagnosis not present

## 2024-06-09 DIAGNOSIS — D631 Anemia in chronic kidney disease: Secondary | ICD-10-CM

## 2024-06-09 DIAGNOSIS — N400 Enlarged prostate without lower urinary tract symptoms: Secondary | ICD-10-CM | POA: Diagnosis not present

## 2024-06-09 DIAGNOSIS — F32A Depression, unspecified: Secondary | ICD-10-CM | POA: Diagnosis present

## 2024-06-09 DIAGNOSIS — E66813 Obesity, class 3: Secondary | ICD-10-CM | POA: Diagnosis present

## 2024-06-09 DIAGNOSIS — R9431 Abnormal electrocardiogram [ECG] [EKG]: Secondary | ICD-10-CM | POA: Diagnosis present

## 2024-06-09 DIAGNOSIS — I130 Hypertensive heart and chronic kidney disease with heart failure and stage 1 through stage 4 chronic kidney disease, or unspecified chronic kidney disease: Secondary | ICD-10-CM | POA: Diagnosis present

## 2024-06-09 DIAGNOSIS — Y92009 Unspecified place in unspecified non-institutional (private) residence as the place of occurrence of the external cause: Secondary | ICD-10-CM | POA: Diagnosis not present

## 2024-06-09 DIAGNOSIS — K219 Gastro-esophageal reflux disease without esophagitis: Secondary | ICD-10-CM | POA: Diagnosis present

## 2024-06-09 DIAGNOSIS — F419 Anxiety disorder, unspecified: Secondary | ICD-10-CM | POA: Diagnosis present

## 2024-06-09 DIAGNOSIS — G9340 Encephalopathy, unspecified: Secondary | ICD-10-CM

## 2024-06-09 DIAGNOSIS — G4733 Obstructive sleep apnea (adult) (pediatric): Secondary | ICD-10-CM | POA: Diagnosis present

## 2024-06-09 DIAGNOSIS — G2581 Restless legs syndrome: Secondary | ICD-10-CM | POA: Diagnosis present

## 2024-06-09 DIAGNOSIS — F1310 Sedative, hypnotic or anxiolytic abuse, uncomplicated: Secondary | ICD-10-CM | POA: Diagnosis not present

## 2024-06-09 DIAGNOSIS — Z7982 Long term (current) use of aspirin: Secondary | ICD-10-CM | POA: Diagnosis not present

## 2024-06-09 DIAGNOSIS — I5032 Chronic diastolic (congestive) heart failure: Secondary | ICD-10-CM | POA: Diagnosis present

## 2024-06-09 DIAGNOSIS — Z781 Physical restraint status: Secondary | ICD-10-CM

## 2024-06-09 DIAGNOSIS — N1832 Chronic kidney disease, stage 3b: Secondary | ICD-10-CM | POA: Diagnosis present

## 2024-06-09 DIAGNOSIS — R4182 Altered mental status, unspecified: Principal | ICD-10-CM

## 2024-06-09 DIAGNOSIS — E785 Hyperlipidemia, unspecified: Secondary | ICD-10-CM | POA: Diagnosis present

## 2024-06-09 DIAGNOSIS — J449 Chronic obstructive pulmonary disease, unspecified: Secondary | ICD-10-CM | POA: Diagnosis present

## 2024-06-09 DIAGNOSIS — N1831 Chronic kidney disease, stage 3a: Secondary | ICD-10-CM

## 2024-06-09 DIAGNOSIS — E119 Type 2 diabetes mellitus without complications: Secondary | ICD-10-CM

## 2024-06-09 DIAGNOSIS — M109 Gout, unspecified: Secondary | ICD-10-CM | POA: Diagnosis present

## 2024-06-09 DIAGNOSIS — M62838 Other muscle spasm: Secondary | ICD-10-CM | POA: Diagnosis present

## 2024-06-09 DIAGNOSIS — Z7985 Long-term (current) use of injectable non-insulin antidiabetic drugs: Secondary | ICD-10-CM | POA: Diagnosis not present

## 2024-06-09 DIAGNOSIS — G928 Other toxic encephalopathy: Secondary | ICD-10-CM | POA: Diagnosis present

## 2024-06-09 DIAGNOSIS — G9341 Metabolic encephalopathy: Secondary | ICD-10-CM | POA: Diagnosis not present

## 2024-06-09 DIAGNOSIS — Z833 Family history of diabetes mellitus: Secondary | ICD-10-CM | POA: Diagnosis not present

## 2024-06-09 DIAGNOSIS — I10 Essential (primary) hypertension: Secondary | ICD-10-CM | POA: Diagnosis not present

## 2024-06-09 DIAGNOSIS — Z79899 Other long term (current) drug therapy: Secondary | ICD-10-CM

## 2024-06-09 DIAGNOSIS — T402X4A Poisoning by other opioids, undetermined, initial encounter: Principal | ICD-10-CM | POA: Diagnosis present

## 2024-06-09 DIAGNOSIS — Z87891 Personal history of nicotine dependence: Secondary | ICD-10-CM

## 2024-06-09 DIAGNOSIS — F1910 Other psychoactive substance abuse, uncomplicated: Secondary | ICD-10-CM | POA: Diagnosis not present

## 2024-06-09 DIAGNOSIS — E1122 Type 2 diabetes mellitus with diabetic chronic kidney disease: Secondary | ICD-10-CM | POA: Diagnosis present

## 2024-06-09 DIAGNOSIS — M545 Low back pain, unspecified: Secondary | ICD-10-CM | POA: Diagnosis present

## 2024-06-09 DIAGNOSIS — Z8249 Family history of ischemic heart disease and other diseases of the circulatory system: Secondary | ICD-10-CM | POA: Diagnosis not present

## 2024-06-09 DIAGNOSIS — Z6841 Body Mass Index (BMI) 40.0 and over, adult: Secondary | ICD-10-CM

## 2024-06-09 DIAGNOSIS — G8929 Other chronic pain: Secondary | ICD-10-CM | POA: Diagnosis present

## 2024-06-09 DIAGNOSIS — E860 Dehydration: Secondary | ICD-10-CM | POA: Diagnosis present

## 2024-06-09 DIAGNOSIS — N179 Acute kidney failure, unspecified: Secondary | ICD-10-CM | POA: Diagnosis present

## 2024-06-09 DIAGNOSIS — I129 Hypertensive chronic kidney disease with stage 1 through stage 4 chronic kidney disease, or unspecified chronic kidney disease: Secondary | ICD-10-CM | POA: Diagnosis not present

## 2024-06-09 LAB — CBC WITH DIFFERENTIAL/PLATELET
Abs Immature Granulocytes: 0.06 K/uL (ref 0.00–0.07)
Basophils Absolute: 0.1 K/uL (ref 0.0–0.1)
Basophils Relative: 1 %
Eosinophils Absolute: 0.3 K/uL (ref 0.0–0.5)
Eosinophils Relative: 4 %
HCT: 37.2 % — ABNORMAL LOW (ref 39.0–52.0)
Hemoglobin: 12.2 g/dL — ABNORMAL LOW (ref 13.0–17.0)
Immature Granulocytes: 1 %
Lymphocytes Relative: 20 %
Lymphs Abs: 1.8 K/uL (ref 0.7–4.0)
MCH: 30 pg (ref 26.0–34.0)
MCHC: 32.8 g/dL (ref 30.0–36.0)
MCV: 91.4 fL (ref 80.0–100.0)
Monocytes Absolute: 0.8 K/uL (ref 0.1–1.0)
Monocytes Relative: 8 %
Neutro Abs: 6.2 K/uL (ref 1.7–7.7)
Neutrophils Relative %: 66 %
Platelets: 252 K/uL (ref 150–400)
RBC: 4.07 MIL/uL — ABNORMAL LOW (ref 4.22–5.81)
RDW: 16.5 % — ABNORMAL HIGH (ref 11.5–15.5)
WBC: 9.3 K/uL (ref 4.0–10.5)
nRBC: 0 % (ref 0.0–0.2)

## 2024-06-09 LAB — COMPREHENSIVE METABOLIC PANEL WITH GFR
ALT: 15 U/L (ref 0–44)
AST: 18 U/L (ref 15–41)
Albumin: 3.7 g/dL (ref 3.5–5.0)
Alkaline Phosphatase: 135 U/L — ABNORMAL HIGH (ref 38–126)
Anion gap: 10 (ref 5–15)
BUN: 37 mg/dL — ABNORMAL HIGH (ref 6–20)
CO2: 31 mmol/L (ref 22–32)
Calcium: 9.5 mg/dL (ref 8.9–10.3)
Chloride: 98 mmol/L (ref 98–111)
Creatinine, Ser: 1.55 mg/dL — ABNORMAL HIGH (ref 0.61–1.24)
GFR, Estimated: 51 mL/min — ABNORMAL LOW
Glucose, Bld: 155 mg/dL — ABNORMAL HIGH (ref 70–99)
Potassium: 3.9 mmol/L (ref 3.5–5.1)
Sodium: 139 mmol/L (ref 135–145)
Total Bilirubin: 0.3 mg/dL (ref 0.0–1.2)
Total Protein: 7.4 g/dL (ref 6.5–8.1)

## 2024-06-09 LAB — GLUCOSE, CAPILLARY
Glucose-Capillary: 123 mg/dL — ABNORMAL HIGH (ref 70–99)
Glucose-Capillary: 132 mg/dL — ABNORMAL HIGH (ref 70–99)

## 2024-06-09 LAB — I-STAT VENOUS BLOOD GAS, ED
Acid-Base Excess: 8 mmol/L — ABNORMAL HIGH (ref 0.0–2.0)
Bicarbonate: 34 mmol/L — ABNORMAL HIGH (ref 20.0–28.0)
Calcium, Ion: 1.19 mmol/L (ref 1.15–1.40)
HCT: 38 % — ABNORMAL LOW (ref 39.0–52.0)
Hemoglobin: 12.9 g/dL — ABNORMAL LOW (ref 13.0–17.0)
O2 Saturation: 92 %
Potassium: 3.8 mmol/L (ref 3.5–5.1)
Sodium: 139 mmol/L (ref 135–145)
TCO2: 36 mmol/L — ABNORMAL HIGH (ref 22–32)
pCO2, Ven: 54.9 mmHg (ref 44–60)
pH, Ven: 7.4 (ref 7.25–7.43)
pO2, Ven: 65 mmHg — ABNORMAL HIGH (ref 32–45)

## 2024-06-09 LAB — I-STAT CHEM 8, ED
BUN: 41 mg/dL — ABNORMAL HIGH (ref 6–20)
Calcium, Ion: 1.18 mmol/L (ref 1.15–1.40)
Chloride: 97 mmol/L — ABNORMAL LOW (ref 98–111)
Creatinine, Ser: 1.7 mg/dL — ABNORMAL HIGH (ref 0.61–1.24)
Glucose, Bld: 157 mg/dL — ABNORMAL HIGH (ref 70–99)
HCT: 38 % — ABNORMAL LOW (ref 39.0–52.0)
Hemoglobin: 12.9 g/dL — ABNORMAL LOW (ref 13.0–17.0)
Potassium: 3.8 mmol/L (ref 3.5–5.1)
Sodium: 139 mmol/L (ref 135–145)
TCO2: 31 mmol/L (ref 22–32)

## 2024-06-09 LAB — URINE DRUG SCREEN
Amphetamines: NEGATIVE
Barbiturates: NEGATIVE
Benzodiazepines: POSITIVE — AB
Cocaine: NEGATIVE
Fentanyl: NEGATIVE
Methadone Scn, Ur: NEGATIVE
Opiates: NEGATIVE
Tetrahydrocannabinol: NEGATIVE

## 2024-06-09 LAB — URINALYSIS, W/ REFLEX TO CULTURE (INFECTION SUSPECTED)
Bilirubin Urine: NEGATIVE
Glucose, UA: NEGATIVE mg/dL
Ketones, ur: NEGATIVE mg/dL
Leukocytes,Ua: NEGATIVE
Nitrite: NEGATIVE
Protein, ur: 100 mg/dL — AB
Specific Gravity, Urine: 1.015 (ref 1.005–1.030)
pH: 7.5 (ref 5.0–8.0)

## 2024-06-09 LAB — PROCALCITONIN: Procalcitonin: <0.1 ng/mL

## 2024-06-09 LAB — CBG MONITORING, ED: Glucose-Capillary: 121 mg/dL — ABNORMAL HIGH (ref 70–99)

## 2024-06-09 LAB — AMMONIA: Ammonia: 90 umol/L — ABNORMAL HIGH (ref 9–35)

## 2024-06-09 LAB — MAGNESIUM: Magnesium: 2.1 mg/dL (ref 1.7–2.4)

## 2024-06-09 LAB — I-STAT CG4 LACTIC ACID, ED: Lactic Acid, Venous: 1.1 mmol/L (ref 0.5–1.9)

## 2024-06-09 LAB — TSH: TSH: 2.64 u[IU]/mL (ref 0.350–4.500)

## 2024-06-09 LAB — T4, FREE: Free T4: 1.34 ng/dL (ref 0.80–2.00)

## 2024-06-09 LAB — MRSA NEXT GEN BY PCR, NASAL: MRSA by PCR Next Gen: NOT DETECTED

## 2024-06-09 MED ORDER — INSULIN ASPART 100 UNIT/ML IJ SOLN
0.0000 [IU] | INTRAMUSCULAR | Status: DC
  Administered 2024-06-09 (×2): 1 [IU] via SUBCUTANEOUS
  Administered 2024-06-10 (×5): 2 [IU] via SUBCUTANEOUS
  Administered 2024-06-11: 1 [IU] via SUBCUTANEOUS
  Administered 2024-06-11 (×2): 2 [IU] via SUBCUTANEOUS
  Administered 2024-06-11 – 2024-06-12 (×4): 1 [IU] via SUBCUTANEOUS
  Administered 2024-06-12 (×2): 2 [IU] via SUBCUTANEOUS
  Administered 2024-06-12 (×2): 1 [IU] via SUBCUTANEOUS
  Administered 2024-06-13: 2 [IU] via SUBCUTANEOUS
  Administered 2024-06-13: 1 [IU] via SUBCUTANEOUS
  Administered 2024-06-13: 2 [IU] via SUBCUTANEOUS
  Filled 2024-06-09 (×2): qty 2
  Filled 2024-06-09: qty 1
  Filled 2024-06-09: qty 3
  Filled 2024-06-09: qty 1
  Filled 2024-06-09: qty 2
  Filled 2024-06-09 (×2): qty 1
  Filled 2024-06-09 (×3): qty 2
  Filled 2024-06-09 (×3): qty 1
  Filled 2024-06-09 (×2): qty 2
  Filled 2024-06-09: qty 1
  Filled 2024-06-09: qty 2

## 2024-06-09 MED ORDER — LABETALOL HCL 5 MG/ML IV SOLN
10.0000 mg | INTRAVENOUS | Status: DC | PRN
  Administered 2024-06-09 – 2024-06-10 (×3): 20 mg via INTRAVENOUS
  Filled 2024-06-09 (×3): qty 4

## 2024-06-09 MED ORDER — HYDRALAZINE HCL 50 MG PO TABS
50.0000 mg | ORAL_TABLET | Freq: Three times a day (TID) | ORAL | Status: DC
  Administered 2024-06-10 – 2024-06-13 (×9): 50 mg via ORAL
  Filled 2024-06-09 (×10): qty 1

## 2024-06-09 MED ORDER — SENNA 8.6 MG PO TABS: 1.0000 | ORAL_TABLET | Freq: Two times a day (BID) | ORAL | Status: DC | PRN

## 2024-06-09 MED ORDER — DOXAZOSIN MESYLATE 2 MG PO TABS
2.0000 mg | ORAL_TABLET | Freq: Every day | ORAL | Status: DC
  Administered 2024-06-10 – 2024-06-13 (×4): 2 mg via ORAL
  Filled 2024-06-09 (×4): qty 1

## 2024-06-09 MED ORDER — DROPERIDOL 2.5 MG/ML IJ SOLN
2.5000 mg | Freq: Once | INTRAMUSCULAR | Status: AC
  Administered 2024-06-09: 2.5 mg via INTRAVENOUS
  Filled 2024-06-09: qty 2

## 2024-06-09 MED ORDER — MIDAZOLAM HCL (PF) 2 MG/2ML IJ SOLN
2.0000 mg | Freq: Once | INTRAMUSCULAR | Status: AC
  Administered 2024-06-09: 2 mg via INTRAVENOUS
  Filled 2024-06-09: qty 2

## 2024-06-09 MED ORDER — HEPARIN SODIUM (PORCINE) 5000 UNIT/ML IJ SOLN
5000.0000 [IU] | Freq: Three times a day (TID) | INTRAMUSCULAR | Status: DC
  Administered 2024-06-10 – 2024-06-13 (×10): 5000 [IU] via SUBCUTANEOUS
  Filled 2024-06-09 (×10): qty 1

## 2024-06-09 MED ORDER — ONDANSETRON HCL 4 MG/2ML IJ SOLN
4.0000 mg | Freq: Once | INTRAMUSCULAR | Status: AC
  Administered 2024-06-09: 4 mg via INTRAVENOUS

## 2024-06-09 MED ORDER — POLYETHYLENE GLYCOL 3350 17 G PO PACK
17.0000 g | PACK | Freq: Every day | ORAL | Status: DC | PRN
  Administered 2024-06-12 – 2024-06-13 (×2): 17 g via ORAL
  Filled 2024-06-09 (×2): qty 1

## 2024-06-09 MED ORDER — DEXMEDETOMIDINE HCL IN NACL 400 MCG/100ML IV SOLN
0.0000 ug/kg/h | INTRAVENOUS | Status: DC
  Administered 2024-06-09: 0.4 ug/kg/h via INTRAVENOUS
  Filled 2024-06-09: qty 100

## 2024-06-09 MED ORDER — HYDRALAZINE HCL 20 MG/ML IJ SOLN
10.0000 mg | Freq: Four times a day (QID) | INTRAMUSCULAR | Status: DC | PRN
  Administered 2024-06-09: 10 mg via INTRAVENOUS
  Filled 2024-06-09: qty 1

## 2024-06-09 MED ORDER — ONDANSETRON HCL 4 MG/2ML IJ SOLN
4.0000 mg | Freq: Four times a day (QID) | INTRAMUSCULAR | Status: DC | PRN
  Administered 2024-06-10: 4 mg via INTRAVENOUS
  Filled 2024-06-09: qty 2

## 2024-06-09 MED ORDER — HALOPERIDOL LACTATE 5 MG/ML IJ SOLN
5.0000 mg | Freq: Once | INTRAMUSCULAR | Status: AC
  Administered 2024-06-09: 5 mg via INTRAVENOUS
  Filled 2024-06-09: qty 1

## 2024-06-09 MED ORDER — LACTATED RINGERS IV SOLN: INTRAVENOUS | Status: AC

## 2024-06-09 MED ORDER — CARVEDILOL 25 MG PO TABS
25.0000 mg | ORAL_TABLET | Freq: Two times a day (BID) | ORAL | Status: DC
  Administered 2024-06-10 – 2024-06-13 (×7): 25 mg via ORAL
  Filled 2024-06-09 (×9): qty 1

## 2024-06-09 MED ORDER — NALOXONE HCL 0.4 MG/ML IJ SOLN
0.4000 mg | Freq: Once | INTRAMUSCULAR | Status: AC
  Administered 2024-06-09: 0.4 mg via INTRAVENOUS

## 2024-06-09 MED ORDER — AMLODIPINE BESYLATE 10 MG PO TABS
10.0000 mg | ORAL_TABLET | Freq: Every day | ORAL | Status: DC
  Administered 2024-06-10 – 2024-06-13 (×4): 10 mg via ORAL
  Filled 2024-06-09 (×5): qty 1

## 2024-06-09 MED ORDER — CHLORHEXIDINE GLUCONATE CLOTH 2 % EX PADS
6.0000 | MEDICATED_PAD | Freq: Every day | CUTANEOUS | Status: DC
  Administered 2024-06-09 – 2024-06-13 (×5): 6 via TOPICAL

## 2024-06-09 MED ORDER — ORAL CARE MOUTH RINSE: 15.0000 mL | OROMUCOSAL | Status: DC | PRN

## 2024-06-09 NOTE — Progress Notes (Signed)
" °   06/09/24 1232  BiPAP/CPAP/SIPAP  $ Non-Invasive Ventilator  Non-Invasive Vent Initial  $ Face Mask Large  Yes  BiPAP/CPAP/SIPAP Pt Type Adult  BiPAP/CPAP/SIPAP SERVO  Mask Type Full face mask  Mask Size Large  Set Rate 18 breaths/min  Respiratory Rate 28 breaths/min  IPAP 13 cmH20  EPAP 5 cmH2O  Pressure Support  (pc 8)  PEEP 5 cmH20  FiO2 (%) 40 %  Leak 55  Peak Inspiratory Pressure (PIP) 12  Patient Home Machine No  Patient Home Mask No  Patient Home Tubing No  Auto Titrate No  Press High Alarm 25 cmH2O  BiPAP/CPAP /SiPAP Vitals  Pulse Rate (!) 53  Resp 10  BP (!) 150/72  SpO2 100 %  MEWS Score/Color  MEWS Score 1  MEWS Score Color Green   Pt placed on BiPAP by RT, after a few minutes pt woke up and ripped mask off. Now on RA and vitals within normal limits.  "

## 2024-06-09 NOTE — ED Notes (Addendum)
 Unable to monitor pt vitals, pt pulls all lines and cords off, pt not redirectable.  Xray and CT unable to complete scan from pt not following directions.  MD notified.

## 2024-06-09 NOTE — ED Provider Notes (Addendum)
" °  Physical Exam  BP 124/75 (BP Location: Right Arm)   Pulse 71   Temp 98.2 F (36.8 C) (Oral)   Resp 16   Ht 5' 11 (1.803 m)   Wt (!) 139 kg   SpO2 98%   BMI 42.74 kg/m   Physical Exam  Procedures  Procedures  ED Course / MDM    Medical Decision Making Amount and/or Complexity of Data Reviewed Labs: ordered. Radiology: ordered.  Risk Prescription drug management. Decision regarding hospitalization.   Care of patient from Dr. Rogelia at 3 PM.  Please see her note for prior history, physical and care.  Briefly this is a 59 year old male with a history of htn, DM, OSA, polysubstance, prior concern for opiate overdose, who presents with AMS. LNW 11:30PM last night, found him altered this AM, initially GCS4, on arrival was 8, gave him narcan  and became alert but still altered-4-2-5.  CT head pending--was altered and agitated and given inability to perform other test, versed /droperidol  given.  He continues to have agitation and altered mental status preventing us  from completing testing on my reevaluation.  Did order dexmedetomidine , however he had episodes of bradycardia with this and it was discontinued.  Ordered Haldol  which his wife had reported the side effect for him was drowsiness, and was able to complete the CT head.  Given the significant improvement described with the Narcan  per Dr. Rogelia, agree that it is likely that opiates contributed to his altered mental status, but suspect there Hayden be other substance contributing.  He does not have fever, low clinical suspicion for encephalitis or meningitis.  His labs show no clinically significant electrolyte abnormalities, no respiratory acidosis in the etiology of this.  His lactic acid is within normal limits.  His CT head completed shows no evidence of intracranial hemorrhage. No report of seizure like activity, lower clinical suspicion for seizure at this time but is on differential.  Ammonia, urinalysis, UDS pending at time of  call for admission to critical care.  Given his severe altered mental status, called critical care for admission.        Dreama Longs, MD 06/10/24 1210  "

## 2024-06-09 NOTE — Progress Notes (Signed)
 eLink Physician-Brief Progress Note Patient Name: Raymond [name] DOB: [birth year] MRN: [id number]   Date of Service  06/09/2024  HPI/Events of Note  Patient admitted with altered mental status suspected to be metabolic encephalopathy causes by poly-pharmacy. Hypertensive encephalopathy is a possible contributing factor.  eICU Interventions  New Patient Evaluation.        Raymond [name] 06/09/2024, 10:41 PM

## 2024-06-09 NOTE — H&P (Signed)
 "  NAME:  Raymond [name], MRN:  [id number], DOB:  1966/04/04, LOS: 0 ADMISSION DATE:  06/09/2024 CONSULTATION DATE:  06/09/2024 REFERRING MD:  Dreama - EDP, CHIEF COMPLAINT:  AMS   History of Present Illness:  59 year old man who presented to [facility] 1/8 with AMS. PMHx significant for HTN, HLD, COPD, OSA (on CPAP), T2DM, CKD stage 3a, iron deficiency anemia, GIB, GERD, RLS, substance abuse. Recent admissions to [facility] 12/18-12/22 for similar.  History is obtained primarily from chart review. Per EMS, patient's LKW was 0130 1/8, found by family around 1200 with AMS. Vitals signs and CBG were WNL; however, patient was noted to have poor respiratory effort prompting assisted ventilations with BVM. On arrival to ED, patient was afebrile with HR 71, BP 124/75, RR 16, SpO2 98%. Labs were notable for WBC 9.3, Hgb 12.2, Plt 252. Na 139, K 3.9, CO2 31, BUN 37/Cr 1.55 (baseline 1.2-1.4), AST/ALT WNL, Alk Phos 135, Tbili 0.3. TSH 2.630, free T4 1.34. LA 1.1. UDS +BZDs (home medication). Ammonia 90. UA pending. CXR unremarkable. CT Head NAICA. GCS 8. Narcan  was administered with some improvement in mental status. Placed on BiPAP, patient then began to wake up and became agitated requiring Haldol  and Versed  administration.  PCCM consulted for admission.  Pertinent Medical History:   Past Medical History:  Diagnosis Date   Acute kidney injury superimposed on chronic kidney disease 04/13/2023   Acute on chronic anemia 04/13/2023   Anemia    Anxiety    Back pain    COPD (chronic obstructive pulmonary disease) (HCC)    Depression    Diabetes mellitus without complication (HCC)    Diverticulosis    with hx of LGIB   Edema    GERD (gastroesophageal reflux disease)    GIB (gastrointestinal bleeding)    Gout    Heavy smoker    Hyperlipidemia    Hypertension    Iron deficiency anemia    Sleep apnea    uses a cpap   Swelling of lower extremity    Significant Hospital Events: Including procedures, antibiotic start  and stop dates in addition to other pertinent events   1/8 - Presented to [facility] via EMS for AMS. GCS 8, required assisted ventilations with BVM. Narcan  with improvement to GCS 11. Placed on BiPAP. Agitated. Haldol /Versed  given. PCCM consulted.  Interim History / Subjective:  PCCM consulted for ICU evaluation/admission.  Objective:  Blood pressure (!) 166/79, pulse 74, temperature (!) 97 F (36.1 C), temperature source Temporal, resp. rate 19, height 5' 11 (1.803 m), weight (!) 139 kg, SpO2 98%.    FiO2 (%):  [40 %] 40 % PEEP:  [5 cmH20] 5 cmH20  No intake or output data in the 24 hours ending 06/09/24 1936 Filed Weights   06/09/24 1314  Weight: (!) 139 kg   Physical Examination: General: Acute-on-chronically ill-appearing older man in NAD. Appears uncomfortable. HEENT: [place]/AT, anicteric sclera, PERRL 3mm, dry mucous membranes. Neuro: Lethargic, eyes closes but writhing/grunting trying to get out of soft restraints. Opens eyes to voice. Responds to verbal stimuli. Not following commands. Moves all 4 extremities spontaneously.  CV: HR variable (50s to 100s?), regular, no m/g/r. PULM: Breathing even and mildly labored on RA. Lung fields diminshed at bases bilaterally, only fair respiratory effort. GI: Obese, soft, nontender, nondistended. Hypoactive bowel sounds. Extremities: Trace bilateral symmetric LE edema noted. Skin: Warm/dry, no rashes. Ecchymosis to dorsal hands bilaterally.  Resolved Hospital Problem List:    Assessment & Plan:  Acute encephalopathy of unclear  etiology, presume toxic 2/2 polypharmacy, less likely septic/metabolic History of polysubstance use Hyperammonemia CT NAICA, labs without significant abnormalities with exception of ammonia 90 (WNL 2 weeks ago). UDS +benzos (Rx for Xanax  at home), also has Rx for Percocet. Improvement in mental status with Narcan . - Admit to ICU, monitor at least overnight - Low threshold for Narcan  readministration if needed - Avoid  Precedex  if possible due to intermittent bradycardia - Correct metabolic derangements; trend LA/pH on gases - Trend ammonia, consider lactulose  - Limit sedating medications as able - Delirium precautions  COPD OSA on CPAP Possible early CAP - Supplemental O2 support as needed to maintain O2 sat > 90% - CPAP PRN/at bedtime, unable to use while restraints in place - Pulmonary hygiene - Bronchodilators PRN - PAD protocol for sedation: PRN agents (Haldol , Versed ) for now, Gault benefit from Precedex  if HR tolerates - Follow CXR, VBG - Low threshold for broad-spectrum PNA coverage if PCT positive  HTN HLD - Resume home antihypertensives as clinically appropriate - Resume ASA/statin as appropriate - Cardiac monitoring - If unable to take PO due to AMS, Hydralazine  10mg  IV PRN for goal SBP < 180  Mild AKI on CKD stage 3a, presume 2/2 dehydration - Trend BMP - Replete electrolytes as indicated - Monitor I&Os - F/u urine studies - Avoid nephrotoxic agents as able - Ensure adequate renal perfusion  T2DM - SSI - CBGs Q4H - Goal CBG 140-180 - Hold home Mounjaro  - F/u UA (?ketones)  GERD - PPI  Chronic anemia, primarily iron deficiency History of GIB - Trend H&H - Monitor for signs of active bleeding - Transfuse for Hgb < 7.0 or hemodynamically significant bleeding  BPH - Resume Cardura  as appropriate  RLS - Resume Mirapex   Labs:  CBC: Recent Labs  Lab 06/09/24 1253 06/09/24 1254 06/09/24 1328  WBC  --   --  9.3  NEUTROABS  --   --  6.2  HGB 12.9* 12.9* 12.2*  HCT 38.0* 38.0* 37.2*  MCV  --   --  91.4  PLT  --   --  252   Basic Metabolic Panel: Recent Labs  Lab 06/09/24 1253 06/09/24 1254 06/09/24 1328  NA 139 139 139  K 3.8 3.8 3.9  CL 97*  --  98  CO2  --   --  31  GLUCOSE 157*  --  155*  BUN 41*  --  37*  CREATININE 1.70*  --  1.55*  CALCIUM   --   --  9.5   GFR: Estimated Creatinine Clearance: 72.3 mL/min (A) (by C-G formula based on SCr of 1.55  mg/dL (H)). Recent Labs  Lab 06/09/24 1258 06/09/24 1328  WBC  --  9.3  LATICACIDVEN 1.1  --    Liver Function Tests: Recent Labs  Lab 06/09/24 1328  AST 18  ALT 15  ALKPHOS 135*  BILITOT 0.3  PROT 7.4  ALBUMIN  3.7   No results for input(s): LIPASE, AMYLASE in the last 168 hours. No results for input(s): AMMONIA in the last 168 hours.  ABG:    Component Value Date/Time   HCO3 34.0 (H) 06/09/2024 1254   TCO2 36 (H) 06/09/2024 1254   O2SAT 92 06/09/2024 1254   Coagulation Profile: No results for input(s): INR, PROTIME in the last 168 hours.  Cardiac Enzymes: No results for input(s): CKTOTAL, CKMB, CKMBINDEX, TROPONINI in the last 168 hours.  HbA1C: Hgb A1c MFr Bld  Date/Time Value Ref Range Status  05/22/2024 03:46 AM 6.7 (H) 4.8 -  5.6 % Final    Comment:    (NOTE) Diagnosis of Diabetes The following HbA1c ranges recommended by the American Diabetes Association (ADA) [place] be used as an aid in the diagnosis of diabetes mellitus.  Hemoglobin             Suggested A1C NGSP%              Diagnosis  <5.7                   Non Diabetic  5.7-6.4                Pre-Diabetic  >6.4                   Diabetic  <7.0                   Glycemic control for                       adults with diabetes.    03/12/2023 06:20 AM 6.0 (H) 4.8 - 5.6 % Final    Comment:    (NOTE) Pre diabetes:          5.7%-6.4%  Diabetes:              >6.4%  Glycemic control for   <7.0% adults with diabetes    CBG: No results for input(s): GLUCAP in the last 168 hours.  Review of Systems:   Patient is encephalopathic and/or intubated; therefore, history has been obtained from chart review.   Past Medical History:  He,  has a past medical history of Acute kidney injury superimposed on chronic kidney disease (04/13/2023), Acute on chronic anemia (04/13/2023), Anemia, Anxiety, Back pain, COPD (chronic obstructive pulmonary disease) (HCC), Depression, Diabetes  mellitus without complication (HCC), Diverticulosis, Edema, GERD (gastroesophageal reflux disease), GIB (gastrointestinal bleeding), Gout, Heavy smoker, Hyperlipidemia, Hypertension, Iron deficiency anemia, Sleep apnea, and Swelling of lower extremity.   Surgical History:   Past Surgical History:  Procedure Laterality Date   CHONDROPLASTY Left 06/28/2014   Procedure: CHONDROPLASTY;  Surgeon: LELON JONETTA Shari Mickey., MD;  Location: [facility];  Service: Orthopedics;  Laterality: Left;   COLONOSCOPY     FOOT FASCIOTOMY     age 73-rt   KNEE ARTHROSCOPY WITH LATERAL MENISECTOMY Left 06/28/2014   Procedure: KNEE ARTHROSCOPY WITH LATERAL MENISECTOMY;  Surgeon: LELON JONETTA Shari Mickey., MD;  Location: [facility];  Service: Orthopedics;  Laterality: Left;   KNEE ARTHROSCOPY WITH MEDIAL MENISECTOMY Left 06/28/2014   Procedure: LEFT KNEE ARTHROSCOPY CHONDROPLASTY/WITH MEDIAL/LATERAL MENISECTOMIES;  Surgeon: LELON JONETTA Shari Mickey., MD;  Location: [facility];  Service: Orthopedics;  Laterality: Left;   ORIF RADIUS & ULNA FRACTURES  2007   left   THORACIC DISCECTOMY N/A 03/12/2023   Procedure: THORACIC LAMINECTOMY AND  DISCECTOMY;  Surgeon: Gillie Duncans, MD;  Location: [facility];  Service: Neurosurgery;  Laterality: N/A;   Social History:   reports that he has quit smoking. His smoking use included cigarettes. He has never used smokeless tobacco. He reports current alcohol use. He reports that he does not use drugs.   Family History:  His family history includes Cancer in an other family member; Depression in his mother; Diabetes in his father and mother; Heart disease in his father and mother; High Cholesterol in his father and mother; Hypertension in his father and mother.   Allergies: Allergies[1]   Home Medications: Prior to Admission  medications  Medication Sig Start Date End Date Taking? Authorizing Provider  albuterol  (VENTOLIN  HFA) 108 (90 Base) MCG/ACT inhaler Inhale 2  puffs into the lungs every 6 (six) hours as needed for wheezing or shortness of breath. 05/12/23  Yes Raulkar, Sven SQUIBB, MD  allopurinol  (ZYLOPRIM ) 300 MG tablet Take 1 tablet (300 mg total) by mouth daily. 05/12/23  Yes Raulkar, Sven SQUIBB, MD  ALPRAZolam  (XANAX ) 0.5 MG tablet Take 1 tablet (0.5 mg total) by mouth 2 (two) times daily as needed. 05/12/23  Yes Raulkar, Sven SQUIBB, MD  amLODipine  (NORVASC ) 10 MG tablet Take 10 mg by mouth at bedtime. 07/20/23  Yes [provider]  aspirin  EC 81 MG tablet Take 81 mg by mouth daily. Swallow whole.   Yes [provider]  atorvastatin  (LIPITOR) 40 MG tablet Take 1 tablet (40 mg total) by mouth every evening. Patient taking differently: Take 40 mg by mouth at bedtime. 05/12/23  Yes Raulkar, Sven SQUIBB, MD  baclofen  (LIORESAL ) 20 MG tablet Take 1 tablet (20 mg total) by mouth 3 (three) times daily. Patient taking differently: Take 20 mg by mouth daily as needed for muscle spasms. 04/27/24  Yes Lovorn, Megan, MD  carvedilol  (COREG ) 25 MG tablet Take 1 tablet (25 mg total) by mouth in the morning and at bedtime. 05/12/23  Yes Raulkar, Sven SQUIBB, MD  diclofenac  Sodium (VOLTAREN ) 1 % GEL Apply 4 g topically 4 (four) times daily. To bilateral knees Patient taking differently: Apply 1 Application topically as needed (pain). To bilateral knees 05/12/23  Yes Raulkar, Sven SQUIBB, MD  doxazosin  (CARDURA ) 2 MG tablet Take 1 tablet (2 mg total) by mouth at bedtime. Patient taking differently: Take 2 mg by mouth 2 (two) times daily. 05/12/23  Yes Raulkar, Sven SQUIBB, MD  DULoxetine  (CYMBALTA ) 60 MG capsule Take 60 mg by mouth daily.   Yes [provider]  FIBER PO Take 1-3 tablets by mouth daily. Depending on constipation   Yes [provider]  furosemide  (LASIX ) 80 MG tablet Take 160 mg by mouth 2 (two) times daily.   Yes [provider]  hydrALAZINE  (APRESOLINE ) 50 MG tablet Take 1 tablet (50 mg total) by mouth 3 (three) times  daily. Patient taking differently: Take 50 mg by mouth daily in the afternoon. 05/23/24  Yes Verdene Purchase, MD  lidocaine  (LIDODERM ) 5 % Place 2 patches onto the skin daily at 10 pm. Remove & Discard patch within 12 hours or as directed by MD Patient taking differently: Place 2 patches onto the skin as needed. Remove & Discard patch within 12 hours or as directed by MD 05/12/23  Yes Raulkar, Sven SQUIBB, MD  metolazone (ZAROXOLYN) 2.5 MG tablet Take 2.5 mg by mouth as needed (only when Lasix  does not work).   Yes [provider]  omeprazole (PRILOSEC) 40 MG capsule Take 40 mg by mouth in the morning and at bedtime. 07/02/23  Yes [provider]  ondansetron  (ZOFRAN ) 4 MG tablet Take 1 tablet (4 mg total) by mouth every 8 (eight) hours as needed for nausea or vomiting. 04/18/24  Yes Delores Shields A, DO  oxyCODONE -acetaminophen  (PERCOCET) 10-325 MG tablet Take 1 tablet by mouth every 6 (six) hours as needed for pain. 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 04/27/24  Yes Lovorn, Megan, MD  polyethylene glycol (MIRALAX ) 17 g packet Take 17 g by mouth 2 (two)  times daily. Patient taking differently: Take 17 g by mouth daily as needed for mild constipation. 03/23/24  Yes Yolande Lamar BROCKS, MD  Potassium Chloride  ER 20 MEQ TBCR Take 2 tablets by mouth in the morning and at bedtime.   Yes [provider]  pramipexole  (MIRAPEX ) 0.5 MG tablet Take 1 tablet (0.5 mg total) by mouth in the morning and at bedtime. 05/12/23  Yes Raulkar, Sven SQUIBB, MD  promethazine  (PHENERGAN ) 12.5 MG tablet Take 12.5 mg by mouth every 6 (six) hours as needed for nausea or vomiting. 05/27/24  Yes [provider]  tirzepatide  (MOUNJARO ) 5 MG/0.5ML Pen Inject 5 mg into the skin once a week. Patient taking differently: Inject 5 mg into the skin once a week. On Thursdays 05/16/24  Yes Delores Shields A,  DO  tiZANidine  (ZANAFLEX ) 4 MG tablet Take 1 tablet (4 mg total) by mouth 3 (three) times daily. - for spasticity with baclofen  Patient taking differently: Take 4 mg by mouth 3 (three) times daily. 04/27/24  Yes Lovorn, Megan, MD  torsemide (DEMADEX) 100 MG tablet Take 100 mg by mouth daily. Patient not taking: Reported on 06/09/2024 05/27/24   [provider]  Vitamin D , Ergocalciferol , (DRISDOL ) 1.25 MG (50000 UNIT) CAPS capsule Take 1 capsule (50,000 Units total) by mouth every 7 (seven) days. Patient not taking: Reported on 06/09/2024 05/16/24   Delores Shields LABOR, DO   Critical care time: N/A   Corean CHRISTELLA Ilah DEVONNA [facility] 06/09/2024 7:36 PM  Please see [email or web address] for pager details.  From 7A-7P if no response, please call [phone] After hours, please call Ema [phone]    [1]  Allergies Allergen Reactions   Haldol  [Haloperidol ] Other (See Comments)    Makes drowsy   Morphine  Other (See Comments)    Mental status changes - angry, mean    Sulfa  Antibiotics Hives   "

## 2024-06-09 NOTE — ED Notes (Signed)
 Pt unable to do x ray. Pt unable to follow direction, will not sit still for exam

## 2024-06-09 NOTE — Progress Notes (Signed)
 eLink Physician-Brief Progress Note Patient Name: Raymond [name] DOB: 06-07-1965 MRN: [id number]   Date of Service  06/09/2024  HPI/Events of Note  Sub-optimal BP control.  eICU Interventions  Home medications resumed and iv PRN Labetalol  added.        [name] U Nai Dasch 06/09/2024, 11:36 PM

## 2024-06-09 NOTE — ED Provider Notes (Addendum)
 " [place] [facility] Provider Note   CSN: [id number] Arrival date & time: 06/09/24  1229     History Chief Complaint  Patient presents with   Altered Mental Status    HPI: Raymond [name] is a 59 y.o. male with history pertinent for hypertension, DM type 2, OSA on CPAP, moderate COPD, GERD, hyperlipidemia, BPH associated with nocturia, chronic diastolic heart failure, restless leg syndrome and history of substance use disorder, wheelchair use at baseline who presents complaining of altered mental status. Patient arrived via EMS from home.  History provided by EMS.  No interpreter required during this encounter.  Patient presents from home for altered mental status.  Per EMS patient was reportedly last seen at baseline at approximately 1:30 AM, and was found altered by family around noon.  EMS reports that patient had normal blood glucose and route, was initially saturating 100% on supplemental oxygen that was provided by fire prior to their arrival, however they felt that he had poor respiratory effort, therefore they assisted ventilations.  They were unable to obtain access and route, no additional interventions and route.  Patient unable to provide additional history due to mental status.  Patient's recorded medical, surgical, social, medication list and allergies were reviewed in the Snapshot window as part of the initial history.   Prior to Admission medications  Medication Sig Start Date End Date Taking? Authorizing Provider  albuterol  (VENTOLIN  HFA) 108 (90 Base) MCG/ACT inhaler Inhale 2 puffs into the lungs every 6 (six) hours as needed for wheezing or shortness of breath. 05/12/23  Yes Raulkar, Sven SQUIBB, MD  allopurinol  (ZYLOPRIM ) 300 MG tablet Take 1 tablet (300 mg total) by mouth daily. 05/12/23  Yes Raulkar, Sven SQUIBB, MD  ALPRAZolam  (XANAX ) 0.5 MG tablet Take 1 tablet (0.5 mg total) by mouth 2 (two) times daily as needed. 05/12/23  Yes Raulkar,  Sven SQUIBB, MD  amLODipine  (NORVASC ) 10 MG tablet Take 10 mg by mouth at bedtime. 07/20/23  Yes [provider]  aspirin  EC 81 MG tablet Take 81 mg by mouth daily. Swallow whole.   Yes [provider]  atorvastatin  (LIPITOR) 40 MG tablet Take 1 tablet (40 mg total) by mouth every evening. Patient taking differently: Take 40 mg by mouth at bedtime. 05/12/23  Yes Raulkar, Sven SQUIBB, MD  baclofen  (LIORESAL ) 20 MG tablet Take 1 tablet (20 mg total) by mouth 3 (three) times daily. Patient taking differently: Take 20 mg by mouth daily as needed for muscle spasms. 04/27/24  Yes Lovorn, Megan, MD  carvedilol  (COREG ) 25 MG tablet Take 1 tablet (25 mg total) by mouth in the morning and at bedtime. 05/12/23  Yes Raulkar, Sven SQUIBB, MD  diclofenac  Sodium (VOLTAREN ) 1 % GEL Apply 4 g topically 4 (four) times daily. To bilateral knees Patient taking differently: Apply 1 Application topically as needed (pain). To bilateral knees 05/12/23  Yes Raulkar, Sven SQUIBB, MD  doxazosin  (CARDURA ) 2 MG tablet Take 1 tablet (2 mg total) by mouth at bedtime. Patient taking differently: Take 2 mg by mouth 2 (two) times daily. 05/12/23  Yes Raulkar, Sven SQUIBB, MD  DULoxetine  (CYMBALTA ) 60 MG capsule Take 60 mg by mouth daily.   Yes [provider]  FIBER PO Take 1-3 tablets by mouth daily. Depending on constipation   Yes [provider]  furosemide  (LASIX ) 80 MG tablet Take 160 mg by mouth 2 (two) times daily.   Yes [provider]  hydrALAZINE  (APRESOLINE ) 50  MG tablet Take 1 tablet (50 mg total) by mouth 3 (three) times daily. Patient taking differently: Take 50 mg by mouth daily in the afternoon. 05/23/24  Yes Verdene Purchase, MD  lidocaine  (LIDODERM ) 5 % Place 2 patches onto the skin daily at 10 pm. Remove & Discard patch within 12 hours or as directed by MD Patient taking differently: Place 2 patches onto the skin as needed. Remove & Discard patch within 12 hours or as directed by  MD 05/12/23  Yes Raulkar, Sven SQUIBB, MD  metolazone (ZAROXOLYN) 2.5 MG tablet Take 2.5 mg by mouth as needed (only when Lasix  does not work).   Yes [provider]  omeprazole (PRILOSEC) 40 MG capsule Take 40 mg by mouth in the morning and at bedtime. 07/02/23  Yes [provider]  ondansetron  (ZOFRAN ) 4 MG tablet Take 1 tablet (4 mg total) by mouth every 8 (eight) hours as needed for nausea or vomiting. 04/18/24  Yes Delores Shields A, DO  oxyCODONE -acetaminophen  (PERCOCET) 10-325 MG tablet Take 1 tablet by mouth every 6 (six) hours as needed for pain. 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 04/27/24  Yes Lovorn, Megan, MD  polyethylene glycol (MIRALAX ) 17 g packet Take 17 g by mouth 2 (two) times daily. Patient taking differently: Take 17 g by mouth daily as needed for mild constipation. 03/23/24  Yes Yolande Lamar BROCKS, MD  Potassium Chloride  ER 20 MEQ TBCR Take 2 tablets by mouth in the morning and at bedtime.   Yes [provider]  pramipexole  (MIRAPEX ) 0.5 MG tablet Take 1 tablet (0.5 mg total) by mouth in the morning and at bedtime. 05/12/23  Yes Raulkar, Sven SQUIBB, MD  promethazine  (PHENERGAN ) 12.5 MG tablet Take 12.5 mg by mouth every 6 (six) hours as needed for nausea or vomiting. 05/27/24  Yes [provider]  tirzepatide  (MOUNJARO ) 5 MG/0.5ML Pen Inject 5 mg into the skin once a week. Patient taking differently: Inject 5 mg into the skin once a week. On Thursdays 05/16/24  Yes Delores Shields A, DO  tiZANidine  (ZANAFLEX ) 4 MG tablet Take 1 tablet (4 mg total) by mouth 3 (three) times daily. - for spasticity with baclofen  Patient taking differently: Take 4 mg by mouth 3 (three) times daily. 04/27/24  Yes Lovorn, Megan, MD  torsemide (DEMADEX) 100 MG tablet Take 100 mg by mouth daily. Patient not taking: Reported on 06/09/2024 05/27/24   [provider]   Vitamin D , Ergocalciferol , (DRISDOL ) 1.25 MG (50000 UNIT) CAPS capsule Take 1 capsule (50,000 Units total) by mouth every 7 (seven) days. Patient not taking: Reported on 06/09/2024 05/16/24   Delores Shields A, DO     Allergies: Haldol  [haloperidol ], Morphine , and Sulfa  antibiotics   Review of Systems   ROS as per HPI  Physical Exam Updated Vital Signs BP 124/75 (BP Location: Right Arm)   Pulse 71   Temp 98.2 F (36.8 C) (Oral)   Resp 16   Ht 5' 11 (1.803 m)   Wt (!) 139 kg   SpO2 98%   BMI 42.74 kg/m  Physical Exam Constitutional:      Appearance: He is obese. He is ill-appearing.  HENT:     Head: Normocephalic and atraumatic.     Nose: Nose normal.  Eyes:     Extraocular Movements: Extraocular movements intact.     Pupils: Pupils are equal, round, and reactive  to light.  Cardiovascular:     Rate and Rhythm: Normal rate and regular rhythm.     Pulses: Normal pulses.     Heart sounds: Normal heart sounds.  Pulmonary:     Breath sounds: Normal breath sounds.  Abdominal:     General: Abdomen is flat. Bowel sounds are normal. There is no distension.     Palpations: Abdomen is soft.     Tenderness: There is no abdominal tenderness. There is no guarding or rebound.  Skin:    General: Skin is warm and dry.  Neurological:     GCS: GCS eye subscore is 2. GCS verbal subscore is 1. GCS motor subscore is 5.     ED Course/ Medical Decision Making/ A&P    Procedures .Critical Care  Performed by: Rogelia Jerilynn RAMAN, MD Authorized by: Rogelia Jerilynn RAMAN, MD   Critical care provider statement:    Critical care time (minutes):  37   Critical care was necessary to treat or prevent imminent or life-threatening deterioration of the following conditions:  CNS failure or compromise   Critical care was time spent personally by me on the following activities:  Development of treatment plan with patient or surrogate, discussions with consultants, evaluation of patient's response to  treatment, examination of patient, ordering and review of laboratory studies, ordering and review of radiographic studies, ordering and performing treatments and interventions, pulse oximetry, re-evaluation of patient's condition and review of old charts   I assumed direction of critical care for this patient from another provider in my specialty: no      Medications Ordered in ED Medications  naloxone  (NARCAN ) injection 0.4 mg (0.4 mg Intravenous Given 06/09/24 1244)  ondansetron  (ZOFRAN ) injection 4 mg (4 mg Intravenous Given 06/09/24 1252)  droperidol  (INAPSINE ) 2.5 MG/ML injection 2.5 mg (2.5 mg Intravenous Given 06/09/24 1304)  midazolam  PF (VERSED ) injection 2 mg (2 mg Intravenous Given 06/09/24 1326)    Medical Decision Making:   Alm BROCKS Pollitt is a 59 y.o. male who presents for altered mental status as per above.  Physical exam is pertinent for GCS 8 (2, 1, 5).   The differential includes but is not limited to opioid overdose, polysubstance/polypharmacy, hypercarbic respiratory failure, ICH, sepsis, seizure.  Independent historian: EMS  External data reviewed: Notes: Reviewed patient's most recent discharge summary from 12/22 where patient was admitted for altered mental status felt to be due to metabolic versus toxic encephalopathy.  Initial Plan:  Screening labs including CBC and Metabolic panel to evaluate for infectious or metabolic etiology of disease.  Screening lactic to evaluate for sepsis Urinalysis with reflex culture ordered to evaluate for UTI or relevant urologic/nephrologic pathology.  UDS to evaluate for possible recreational overdose contributing to presentation VBG to evaluate for hypercarbic respiratory failure CXR to evaluate for structural/infectious intra-thoracic pathology.  CT head to evaluate for structural/vascular intracranial pathology EKG to evaluate for cardiac pathology Objective evaluation as below reviewed   Labs: Ordered, Independent interpretation, and  Details: Lactic acid WNL. VBG without acidosis or hypercarbia.  UDS pending at the time of handoff.  UA pending at the time of handoff.  CBC without leukocytosis or thrombocytopenia.  Stable anemia on comparison to prior.  CMP with creatinine and BUN similar in comparison to prior.  No emergent electrolyte derangement or emergent LFT abnormality.  Radiology: Ordered, Independent interpretation, Details: Chest x-ray with coarse interstitial markings bilaterally, overall similar on comparison to most recent prior chest x-ray, I do not appreciate focal airspace opacification, cardiomediastinal  silhouette derangement, pneumothorax, pleural effusion, bony derangement, and All images reviewed independently.  Agree with radiology report at this time.   DG Chest Portable 1 View Result Date: 06/09/2024 CLINICAL DATA:  Altered mental status EXAM: PORTABLE CHEST 1 VIEW COMPARISON:  May 27, 2024 FINDINGS: Stable cardiomediastinal silhouette. Both lungs are clear. Severe degenerative changes seen involving right glenohumeral joint. IMPRESSION: No active disease. Electronically Signed   By: Lynwood Landy Raddle M.D.   On: 06/09/2024 14:56   DG Chest Port 1 View Result Date: 05/27/2024 CLINICAL DATA:  Syncope. EXAM: PORTABLE CHEST 1 VIEW COMPARISON:  05/19/2024. FINDINGS: Stable cardiomegaly. No overt pulmonary edema, focal consolidation, sizeable pleural effusion, or pneumothorax. No acute osseous abnormality. IMPRESSION: 1. No acute cardiopulmonary findings. 2. Stable cardiomegaly. Electronically Signed   By: Harrietta Sherry M.D.   On: 05/27/2024 17:46   DG Foot Complete Right Result Date: 05/22/2024 CLINICAL DATA:  Bruising and pain of the right foot. EXAM: RIGHT FOOT COMPLETE - 3+ VIEW COMPARISON:  None Available. FINDINGS: Age indeterminate fracture of the base of the proximal phalanx of the fourth digit. Correlation with clinical exam and point tenderness recommended. Old healed fracture of the fifth metatarsal.  The bones are osteopenic. No dislocation. Soft tissue edema of the dorsum of the foot. No opaque foreign object or soft tissue gas. IMPRESSION: Age indeterminate fracture of the base of the proximal phalanx of the fourth digit. Correlation with clinical exam and point tenderness recommended. Electronically Signed   By: Vanetta Chou M.D.   On: 05/22/2024 12:31   CT Head Wo Contrast Result Date: 05/20/2024 EXAM: CT HEAD WITHOUT CONTRAST 05/20/2024 02:54:41 AM TECHNIQUE: CT of the head was performed without the administration of intravenous contrast. Automated exposure control, iterative reconstruction, and/or weight based adjustment of the mA/kV was utilized to reduce the radiation dose to as low as reasonably achievable. COMPARISON: 03/30/2024 CLINICAL HISTORY: Mental status change, unknown cause. FINDINGS: LIMITATIONS: Motion limited study. BRAIN AND VENTRICLES: No acute hemorrhage. No evidence of acute infarct. No extra-axial collection. No mass effect or midline shift. ORBITS: No acute abnormality. SINUSES: No acute abnormality. SOFT TISSUES AND SKULL: No acute soft tissue abnormality. No skull fracture. IMPRESSION: 1. No acute intracranial abnormality. 2. Evaluation limited by motion artifact. Electronically signed by: Oneil Devonshire MD 05/20/2024 03:15 AM EST RP Workstation: [id number]   DG Chest Port 1 View if patient is in a treatment room. Result Date: 05/19/2024 EXAM: 1 VIEW(S) XRAY OF THE CHEST 05/19/2024 10:10:00 PM COMPARISON: 03/30/2024 CLINICAL HISTORY: Suspected Sepsis FINDINGS: LUNGS AND PLEURA: Lower lung volumes. No focal pulmonary opacity. No pleural effusion. No pneumothorax. HEART AND MEDIASTINUM: Stable cardiomegaly. BONES AND SOFT TISSUES: No acute osseous abnormality. IMPRESSION: 1. No acute findings. 2. Stable cardiomegaly. Electronically signed by: Greig Pique MD 05/19/2024 10:17 PM EST RP Workstation: [id number]    EKG/Medicine tests: Ordered and Independent interpretation EKG  Interpretation: Sinus bradycardia Atrial premature complex Probable anteroseptal infarct, old Confirmed by Rogelia Satterfield ([id number]) on 06/09/2024 12:51:57 PM                 Interventions: Zofran , Narcan   See the EMR for full details regarding lab and imaging results.  Patient presents altered with assisted ventilations on arrival from EMS, was transferred over to ED bed and ventilations were assisted by respiratory, when respiratory performed jaw thrust, patient opened eyes to pain as well as localize to painful stimulus including jaw thrust and attempts to obtain IV access.  Given patient with some spontaneous respiratory effort  and GCS of 8 (2, 1, 5) improved from reported for en route, initiated on BiPAP, sat up in bed.  Given normal glucose prior to arrival, also considered opioid overdose, therefore I personally administered 4 of Zofran  and 0.4 of Narcan  in the right AC, meds were drawn up by pharmacist.  Within minutes of pushing Narcan , patient with improved mental status, GCS 11 (4, 2, 5), though patient grunting, pulling BiPAP, and attempting to sit up.  Patient was sat up in bed, BiPAP removed and patient maintained 100% on room air, patient continues to be agitated, pulling at his lower extremities attempting to place himself in a crisscross applesauce position.  Per wife, patient is typically alert, oriented, is wheelchair-bound at baseline, however is functional in daily life, for example was able to cook breakfast for them yesterday morning, and this is not baseline, however it is similar to the previous time he was admitted for altered mental status.  Reports that etiology of altered mental status was never fully elucidated during previous hospitalization, they wanted to obtain MRI during that hospitalization, however patient had spontaneous improvement in mental status and reportedly declined MRI.  Patient is not currently at baseline per wife.  Labs obtained and are reassuring, patient  without acidosis, hypercarbia, thus doubt hypercarbic respiratory failure driving altered mental status, lactic acid WNL and no leukocytosis, thus doubt seizure, occult sepsis, infectious etiology.  CBC and CMP reassuring, no overt derangements to suggest metabolic encephalopathy.  Patient did have significant agitation that required dosing with both droperidol  as well as Versed  such that lines and leads could be maintained.  At the time of handoff, labs and chest x-ray were reassuring and did not reveal etiology of patient's altered mental status.  Plan admit on signout, follow-up CT head, if patient is persistently altered limiting ability to obtain this study, consider additional Versed /droperidol , and/or initiation of Precedex .  Anticipate patient will likely need admission for altered mental status.  Presentation is most consistent with acute life/limb-threatening illness  Discussion of management or test interpretations with external provider(s): None by the time of handoff  Risk Drugs:Prescription drug management and Parenteral controlled substances Treatment: Decision regarding hospitalization Critical Care: 37 minutes  Disposition: HANDOFF: At the time of signout, the patients CT head had not yet been completed. I transferred care of the patient at the time of signout to Dr. Dreama. I informed the incoming care provider of the patient's history, status, and management plan. I addressed all of their concerns and/or questions to the best of my ability. Please refer to the incoming care provider's note for details regarding the remainder of the patient's ED course and disposition.  MDM generated using voice dictation software and Dosch contain dictation errors.  Please contact me for any clarification or with any questions.  Clinical Impression:  1. Altered mental status, unspecified altered mental status type   2. Opioid overdose, undetermined intent, initial encounter [facility])       Admit   Final Clinical Impression(s) / ED Diagnoses Final diagnoses:  Altered mental status, unspecified altered mental status type  Opioid overdose, undetermined intent, initial encounter [facility])    Rx / DC Orders ED Discharge Orders     None        Rogelia Jerilynn RAMAN, MD 06/09/24 1604    Rogelia Jerilynn RAMAN, MD 06/09/24 1605  "

## 2024-06-09 NOTE — ED Triage Notes (Signed)
 Pt to the ed from home with a cc altered mental status with a LSN of 130. Pt has a GCS of 4. Ems had bgl of 158. Pt being ventilated by ems.

## 2024-06-09 NOTE — ED Notes (Signed)
 Pt resting quietly in bed, CT conatcted to take to for scan.

## 2024-06-09 NOTE — ED Notes (Signed)
 Pt pulling all cords and monitors off unable to obtain additional blood work or CT scan at this time. MD notified.

## 2024-06-10 ENCOUNTER — Inpatient Hospital Stay (HOSPITAL_COMMUNITY)

## 2024-06-10 DIAGNOSIS — F1910 Other psychoactive substance abuse, uncomplicated: Secondary | ICD-10-CM

## 2024-06-10 DIAGNOSIS — G9341 Metabolic encephalopathy: Secondary | ICD-10-CM

## 2024-06-10 DIAGNOSIS — Z7985 Long-term (current) use of injectable non-insulin antidiabetic drugs: Secondary | ICD-10-CM

## 2024-06-10 DIAGNOSIS — E1122 Type 2 diabetes mellitus with diabetic chronic kidney disease: Secondary | ICD-10-CM

## 2024-06-10 DIAGNOSIS — Z87891 Personal history of nicotine dependence: Secondary | ICD-10-CM

## 2024-06-10 DIAGNOSIS — I129 Hypertensive chronic kidney disease with stage 1 through stage 4 chronic kidney disease, or unspecified chronic kidney disease: Secondary | ICD-10-CM

## 2024-06-10 DIAGNOSIS — F1310 Sedative, hypnotic or anxiolytic abuse, uncomplicated: Secondary | ICD-10-CM

## 2024-06-10 LAB — COMPREHENSIVE METABOLIC PANEL WITH GFR
ALT: 16 U/L (ref 0–44)
AST: 20 U/L (ref 15–41)
Albumin: 3.8 g/dL (ref 3.5–5.0)
Alkaline Phosphatase: 133 U/L — ABNORMAL HIGH (ref 38–126)
Anion gap: 12 (ref 5–15)
BUN: 26 mg/dL — ABNORMAL HIGH (ref 6–20)
CO2: 30 mmol/L (ref 22–32)
Calcium: 10.1 mg/dL (ref 8.9–10.3)
Chloride: 100 mmol/L (ref 98–111)
Creatinine, Ser: 1.27 mg/dL — ABNORMAL HIGH (ref 0.61–1.24)
GFR, Estimated: >60 mL/min
Glucose, Bld: 157 mg/dL — ABNORMAL HIGH (ref 70–99)
Potassium: 3.1 mmol/L — ABNORMAL LOW (ref 3.5–5.1)
Sodium: 142 mmol/L (ref 135–145)
Total Bilirubin: 0.4 mg/dL (ref 0.0–1.2)
Total Protein: 7.7 g/dL (ref 6.5–8.1)

## 2024-06-10 LAB — BLOOD GAS, VENOUS
Acid-Base Excess: 9.7 mmol/L — ABNORMAL HIGH (ref 0.0–2.0)
Bicarbonate: 33.5 mmol/L — ABNORMAL HIGH (ref 20.0–28.0)
Drawn by: 73889
O2 Saturation: 80.2 %
Patient temperature: 38
pCO2, Ven: 43 mmHg — ABNORMAL LOW (ref 44–60)
pH, Ven: 7.5 — ABNORMAL HIGH (ref 7.25–7.43)
pO2, Ven: 47 mmHg — ABNORMAL HIGH (ref 32–45)

## 2024-06-10 LAB — GLUCOSE, CAPILLARY
Glucose-Capillary: 154 mg/dL — ABNORMAL HIGH (ref 70–99)
Glucose-Capillary: 178 mg/dL — ABNORMAL HIGH (ref 70–99)
Glucose-Capillary: 161 mg/dL — ABNORMAL HIGH (ref 70–99)
Glucose-Capillary: 108 mg/dL — ABNORMAL HIGH (ref 70–99)
Glucose-Capillary: 177 mg/dL — ABNORMAL HIGH (ref 70–99)
Glucose-Capillary: 167 mg/dL — ABNORMAL HIGH (ref 70–99)

## 2024-06-10 LAB — CBC
HCT: 39.7 % (ref 39.0–52.0)
Hemoglobin: 13.4 g/dL (ref 13.0–17.0)
MCH: 29.6 pg (ref 26.0–34.0)
MCHC: 33.8 g/dL (ref 30.0–36.0)
MCV: 87.8 fL (ref 80.0–100.0)
Platelets: 299 K/uL (ref 150–400)
RBC: 4.52 MIL/uL (ref 4.22–5.81)
RDW: 16.4 % — ABNORMAL HIGH (ref 11.5–15.5)
WBC: 13.7 K/uL — ABNORMAL HIGH (ref 4.0–10.5)
nRBC: 0 % (ref 0.0–0.2)

## 2024-06-10 LAB — AMMONIA: Ammonia: 25 umol/L (ref 9–35)

## 2024-06-10 LAB — MAGNESIUM: Magnesium: 1.9 mg/dL (ref 1.7–2.4)

## 2024-06-10 LAB — PHOSPHORUS: Phosphorus: 2.1 mg/dL — ABNORMAL LOW (ref 2.5–4.6)

## 2024-06-10 MED ORDER — POTASSIUM PHOSPHATES 15 MMOLE/5ML IV SOLN
15.0000 mmol | Freq: Once | INTRAVENOUS | Status: AC
  Administered 2024-06-10: 15 mmol via INTRAVENOUS
  Filled 2024-06-10: qty 5

## 2024-06-10 MED ORDER — HYDRALAZINE HCL 20 MG/ML IJ SOLN
10.0000 mg | INTRAMUSCULAR | Status: DC | PRN
  Administered 2024-06-10 (×3): 20 mg via INTRAVENOUS
  Administered 2024-06-11: 10 mg via INTRAVENOUS
  Filled 2024-06-10 (×4): qty 1

## 2024-06-10 MED ORDER — POTASSIUM CHLORIDE 10 MEQ/100ML IV SOLN
10.0000 meq | INTRAVENOUS | Status: AC
  Administered 2024-06-10 (×4): 10 meq via INTRAVENOUS
  Filled 2024-06-10 (×4): qty 100

## 2024-06-10 MED ORDER — MAGNESIUM SULFATE 2 GM/50ML IV SOLN
2.0000 g | Freq: Once | INTRAVENOUS | Status: AC
  Administered 2024-06-10: 2 g via INTRAVENOUS
  Filled 2024-06-10: qty 50

## 2024-06-10 MED ORDER — OXYCODONE HCL 5 MG PO TABS
5.0000 mg | ORAL_TABLET | Freq: Four times a day (QID) | ORAL | Status: AC | PRN
  Administered 2024-06-10 – 2024-06-12 (×3): 5 mg via ORAL
  Filled 2024-06-10 (×3): qty 1

## 2024-06-10 MED ORDER — CLEVIDIPINE BUTYRATE 0.5 MG/ML IV EMUL
0.0000 mg/h | INTRAVENOUS | Status: AC
  Administered 2024-06-10: 16 mg/h via INTRAVENOUS
  Administered 2024-06-10: 2 mg/h via INTRAVENOUS
  Administered 2024-06-10: 20 mg/h via INTRAVENOUS
  Administered 2024-06-10: 10 mg/h via INTRAVENOUS
  Filled 2024-06-10 (×4): qty 100

## 2024-06-10 NOTE — Progress Notes (Signed)
 eLink Physician-Brief Progress Note Patient Name: Raymond [name] DOB: 10/07/1965 MRN: [id number]   Date of Service  06/10/2024  HPI/Events of Note  Patient with chronic musculo-skeletal pain.  eICU Interventions  Oxycodone  5 mg po Q 6 hours PRN pain ordered.        Jayveion Stalling U Rosaline Ezekiel 06/10/2024, 7:53 PM

## 2024-06-10 NOTE — Progress Notes (Signed)
 eLink Physician-Brief Progress Note Patient Name: Raymond [name] DOB: June 11, 1965 MRN: [id number]   Date of Service  06/10/2024  HPI/Events of Note  BP 194/100 despite optimal PRN's with Labetalol  / Hydralazine . HR 60. Patient not able to take his oral medications.  eICU Interventions  Cleviprex  gtt ordered.        Irineo Gaulin U Cataleia Gade 06/10/2024, 1:52 AM

## 2024-06-10 NOTE — Progress Notes (Addendum)
 "  NAME:  Raymond [name], MRN:  [id number], DOB:  03-Dec-1965, LOS: 1 ADMISSION DATE:  06/09/2024, CONSULTATION DATE: 06/09/2024 REFERRING MD:   Dreama , CHIEF COMPLAINT: AMS  History of Present Illness:  59 year old man who presented to [facility] 1/8 with AMS. PMHx significant for HTN, HLD, COPD, OSA (on CPAP), T2DM, CKD stage 3a, iron deficiency anemia, GIB, GERD, RLS, substance abuse. Recent admissions to [facility] 12/18-12/22 for similar History is obtained primarily from chart review. Per EMS, patient's LKW was 0130 1/8, found by family around 1200 with AMS. Vitals signs and CBG were WNL; however, patient was noted to have poor respiratory effort prompting assisted ventilations with BVM. On arrival to ED, patient was afebrile with HR 71, BP 124/75, RR 16, SpO2 98%. Labs were notable for WBC 9.3, Hgb 12.2, Plt 252. Na 139, K 3.9, CO2 31, BUN 37/Cr 1.55 (baseline 1.2-1.4), AST/ALT WNL, Alk Phos 135, Tbili 0.3. TSH 2.630, free T4 1.34. LA 1.1. UDS +BZDs (home medication). Ammonia 90. UA pending. CXR unremarkable. CT Head NAICA. GCS 8. Narcan  was administered with some improvement in mental status. Placed on BiPAP, patient then began to wake up and became agitated requiring Haldol  and Versed  administration.  Pertinent  Medical History    Acute kidney injury superimposed on chronic kidney disease 04/13/2023    Acute on chronic anemia 04/13/2023   Anemia     Anxiety     Back pain     COPD (chronic obstructive pulmonary disease) (HCC)     Depression     Diabetes mellitus without complication (HCC)     Diverticulosis      with hx of LGIB   Edema     GERD (gastroesophageal reflux disease)     GIB (gastrointestinal bleeding)     Gout     Heavy smoker     Hyperlipidemia     Hypertension     Iron deficiency anemia     Sleep apnea      uses a cpap   Swelling of lower extremity     Significant Hospital Events: Including procedures, antibiotic start and stop dates in addition to other pertinent events   1/8  - Presented to [facility] via EMS for AMS. GCS 8, required assisted ventilations with BVM. Narcan  with improvement to GCS 11. Placed on BiPAP. Agitated. Haldol /Versed  given. PCCM consulted.   Interim History / Subjective:  much more alert.  Somnolence was likely from Haldol , home Percocet, Xanax  and baclofen  but AO X3 protecting airway, able to have meds with applesauce Titrating down Cleviprex , now receiving p.o. HTN meds  Objective    Blood pressure (!) 126/55, pulse 70, temperature 98.2 F (36.8 C), temperature source Oral, resp. rate 18, height 5' 11 (1.803 m), weight (!) 138.3 kg, SpO2 95%.    FiO2 (%):  [40 %] 40 % PEEP:  [5 cmH20] 5 cmH20   Intake/Output Summary (Last 24 hours) at 06/10/2024 0717 Last data filed at 06/10/2024 9357 Gross per 24 hour  Intake 327.88 ml  Output 1175 ml  Net -847.12 ml   Filed Weights   06/09/24 1314 06/09/24 2223 06/10/24 0300  Weight: (!) 139 kg (!) 138.3 kg (!) 138.3 kg    Examination: General: Chronically ill-appearing male, not in distress HENT: NCAT Lungs: Symmetrical chest movement, clear to auscultation Cardiovascular: S1-S2, normal no murmur Abdomen: Obese, soft nontender nondistended Extremities:, Warm, trace bilateral edema Neuro: AO X3, following commands move all extremities GU: Deferred  Resolved problem list   Assessment and  Plan   59 year old male with history of HTN, COPD, OSA-CPAP, DM2, CKD 3 AA who was brought in for altered mental status thought to be likely due to polypharmacy-now improving  Acute metabolic encephalopathy  likely due to polypharmacy History of polysubstance use-home Precedex  baclofen , Xanax  Hyperammonemia-improved AKI on CKD 3 A DM 2   Plan - ICU monitoring and management - Wean off Cleviprex , now able to tolerate p.o. - Resume home HTN meds - VBG, BiPAP nightly, nasal cannula during daytime - Continue pulse oximetry and cardiac monitoring - Delirium precaution-monitor I&O's - Glucose  monitoring - Monitor and replace electrolyte as needed   Patient's wife at the bedside,.  She reports patient is chronically on pain meds for low back pain follows with pain management.  She reports though patient is on as needed pain meds sometimes will take more due to low back pain and would make him lethargic/confused.  Went over in detail side effect of pain meds, she tells me patient has an upcoming appointment with pain management.  She is agreeable with the plan of trying to reduce pain meds and see how he does.  Today patient is more awake, conversational.    Labs   CBC: Recent Labs  Lab 06/09/24 1253 06/09/24 1254 06/09/24 1328 06/10/24 0300  WBC  --   --  9.3 13.7*  NEUTROABS  --   --  6.2  --   HGB 12.9* 12.9* 12.2* 13.4  HCT 38.0* 38.0* 37.2* 39.7  MCV  --   --  91.4 87.8  PLT  --   --  252 299    Basic Metabolic Panel: Recent Labs  Lab 06/09/24 1253 06/09/24 1254 06/09/24 1328 06/09/24 2113 06/10/24 0300  NA 139 139 139  --  142  K 3.8 3.8 3.9  --  3.1*  CL 97*  --  98  --  100  CO2  --   --  31  --  30  GLUCOSE 157*  --  155*  --  157*  BUN 41*  --  37*  --  26*  CREATININE 1.70*  --  1.55*  --  1.27*  CALCIUM   --   --  9.5  --  10.1  MG  --   --   --  2.1 1.9  PHOS  --   --   --   --  2.1*   GFR: Estimated Creatinine Clearance: 87.9 mL/min (A) (by C-G formula based on SCr of 1.27 mg/dL (H)). Recent Labs  Lab 06/09/24 1258 06/09/24 1328 06/09/24 2113 06/10/24 0300  PROCALCITON  --   --  <0.10  --   WBC  --  9.3  --  13.7*  LATICACIDVEN 1.1  --   --   --     Liver Function Tests: Recent Labs  Lab 06/09/24 1328 06/10/24 0300  AST 18 20  ALT 15 16  ALKPHOS 135* 133*  BILITOT 0.3 0.4  PROT 7.4 7.7  ALBUMIN  3.7 3.8   No results for input(s): LIPASE, AMYLASE in the last 168 hours. Recent Labs  Lab 06/09/24 2017 06/10/24 0300  AMMONIA 90* 25    ABG    Component Value Date/Time   HCO3 34.0 (H) 06/09/2024 1254   TCO2 36 (H)  06/09/2024 1254   O2SAT 92 06/09/2024 1254     Coagulation Profile: No results for input(s): INR, PROTIME in the last 168 hours.  Cardiac Enzymes: No results for input(s): CKTOTAL, CKMB, CKMBINDEX, TROPONINI in the  last 168 hours.  HbA1C: Hgb A1c MFr Bld  Date/Time Value Ref Range Status  05/22/2024 03:46 AM 6.7 (H) 4.8 - 5.6 % Final    Comment:    (NOTE) Diagnosis of Diabetes The following HbA1c ranges recommended by the American Diabetes Association (ADA) Mohamad be used as an aid in the diagnosis of diabetes mellitus.  Hemoglobin             Suggested A1C NGSP%              Diagnosis  <5.7                   Non Diabetic  5.7-6.4                Pre-Diabetic  >6.4                   Diabetic  <7.0                   Glycemic control for                       adults with diabetes.    03/12/2023 06:20 AM 6.0 (H) 4.8 - 5.6 % Final    Comment:    (NOTE) Pre diabetes:          5.7%-6.4%  Diabetes:              >6.4%  Glycemic control for   <7.0% adults with diabetes     CBG: Recent Labs  Lab 06/09/24 2123 06/09/24 2215 06/09/24 2324 06/10/24 0309  GLUCAP 121* 123* 132* 154*    Review of Systems:   Negative except above  Past Medical History:  He,  has a past medical history of Acute kidney injury superimposed on chronic kidney disease (04/13/2023), Acute on chronic anemia (04/13/2023), Anemia, Anxiety, Back pain, COPD (chronic obstructive pulmonary disease) (HCC), Depression, Diabetes mellitus without complication (HCC), Diverticulosis, Edema, GERD (gastroesophageal reflux disease), GIB (gastrointestinal bleeding), Gout, Heavy smoker, Hyperlipidemia, Hypertension, Iron deficiency anemia, Sleep apnea, and Swelling of lower extremity.   Surgical History:   Past Surgical History:  Procedure Laterality Date   CHONDROPLASTY Left 06/28/2014   Procedure: CHONDROPLASTY;  Surgeon: LELON JONETTA Shari Mickey., MD;  Location: [facility];  Service:  Orthopedics;  Laterality: Left;   COLONOSCOPY     FOOT FASCIOTOMY     age 89-rt   KNEE ARTHROSCOPY WITH LATERAL MENISECTOMY Left 06/28/2014   Procedure: KNEE ARTHROSCOPY WITH LATERAL MENISECTOMY;  Surgeon: LELON JONETTA Shari Mickey., MD;  Location: [facility];  Service: Orthopedics;  Laterality: Left;   KNEE ARTHROSCOPY WITH MEDIAL MENISECTOMY Left 06/28/2014   Procedure: LEFT KNEE ARTHROSCOPY CHONDROPLASTY/WITH MEDIAL/LATERAL MENISECTOMIES;  Surgeon: LELON JONETTA Shari Mickey., MD;  Location:  SURGERY CENTER;  Service: Orthopedics;  Laterality: Left;   ORIF RADIUS & ULNA FRACTURES  2007   left   THORACIC DISCECTOMY N/A 03/12/2023   Procedure: THORACIC LAMINECTOMY AND  DISCECTOMY;  Surgeon: Gillie Duncans, MD;  Location: [facility];  Service: Neurosurgery;  Laterality: N/A;     Social History:   reports that he has quit smoking. His smoking use included cigarettes. He has never used smokeless tobacco. He reports current alcohol use. He reports that he does not use drugs.   Family History:  His family history includes Cancer in an other family member; Depression in his mother; Diabetes in his father and mother; Heart disease in his father and  mother; High Cholesterol in his father and mother; Hypertension in his father and mother.   Allergies Allergies[1]   Home Medications  Prior to Admission medications  Medication Sig Start Date End Date Taking? Authorizing Provider  albuterol  (VENTOLIN  HFA) 108 (90 Base) MCG/ACT inhaler Inhale 2 puffs into the lungs every 6 (six) hours as needed for wheezing or shortness of breath. 05/12/23  Yes Raulkar, Sven SQUIBB, MD  allopurinol  (ZYLOPRIM ) 300 MG tablet Take 1 tablet (300 mg total) by mouth daily. 05/12/23  Yes Raulkar, Sven SQUIBB, MD  ALPRAZolam  (XANAX ) 0.5 MG tablet Take 1 tablet (0.5 mg total) by mouth 2 (two) times daily as needed. 05/12/23  Yes Raulkar, Sven SQUIBB, MD  amLODipine  (NORVASC ) 10 MG tablet Take 10 mg by mouth at bedtime. 07/20/23  Yes  [provider]  aspirin  EC 81 MG tablet Take 81 mg by mouth daily. Swallow whole.   Yes [provider]  atorvastatin  (LIPITOR) 40 MG tablet Take 1 tablet (40 mg total) by mouth every evening. Patient taking differently: Take 40 mg by mouth at bedtime. 05/12/23  Yes Raulkar, Sven SQUIBB, MD  baclofen  (LIORESAL ) 20 MG tablet Take 1 tablet (20 mg total) by mouth 3 (three) times daily. Patient taking differently: Take 20 mg by mouth daily as needed for muscle spasms. 04/27/24  Yes Lovorn, Megan, MD  carvedilol  (COREG ) 25 MG tablet Take 1 tablet (25 mg total) by mouth in the morning and at bedtime. 05/12/23  Yes Raulkar, Sven SQUIBB, MD  diclofenac  Sodium (VOLTAREN ) 1 % GEL Apply 4 g topically 4 (four) times daily. To bilateral knees Patient taking differently: Apply 1 Application topically as needed (pain). To bilateral knees 05/12/23  Yes Raulkar, Sven SQUIBB, MD  doxazosin  (CARDURA ) 2 MG tablet Take 1 tablet (2 mg total) by mouth at bedtime. Patient taking differently: Take 2 mg by mouth 2 (two) times daily. 05/12/23  Yes Raulkar, Sven SQUIBB, MD  DULoxetine  (CYMBALTA ) 60 MG capsule Take 60 mg by mouth daily.   Yes [provider]  FIBER PO Take 1-3 tablets by mouth daily. Depending on constipation   Yes [provider]  furosemide  (LASIX ) 80 MG tablet Take 160 mg by mouth 2 (two) times daily.   Yes [provider]  hydrALAZINE  (APRESOLINE ) 50 MG tablet Take 1 tablet (50 mg total) by mouth 3 (three) times daily. Patient taking differently: Take 50 mg by mouth daily in the afternoon. 05/23/24  Yes Verdene Purchase, MD  lidocaine  (LIDODERM ) 5 % Place 2 patches onto the skin daily at 10 pm. Remove & Discard patch within 12 hours or as directed by MD Patient taking differently: Place 2 patches onto the skin as needed. Remove & Discard patch within 12 hours or as directed by MD 05/12/23  Yes Raulkar, Sven SQUIBB, MD  metolazone (ZAROXOLYN) 2.5 MG tablet Take 2.5 mg by  mouth as needed (only when Lasix  does not work).   Yes [provider]  omeprazole (PRILOSEC) 40 MG capsule Take 40 mg by mouth in the morning and at bedtime. 07/02/23  Yes [provider]  ondansetron  (ZOFRAN ) 4 MG tablet Take 1 tablet (4 mg total) by mouth every 8 (eight) hours as needed for nausea or vomiting. 04/18/24  Yes Delores Shields A, DO  oxyCODONE -acetaminophen  (PERCOCET) 10-325 MG tablet Take 1 tablet by mouth every 6 (six) hours as needed for pain. 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 04/27/24  Yes Lovorn, Megan, MD  polyethylene glycol (MIRALAX ) 17 g packet Take 17 g by mouth 2 (two) times daily. Patient taking differently: Take 17 g by mouth daily as needed for mild constipation. 03/23/24  Yes Yolande Lamar BROCKS, MD  Potassium Chloride  ER 20 MEQ TBCR Take 2 tablets by mouth in the morning and at bedtime.   Yes [provider]  pramipexole  (MIRAPEX ) 0.5 MG tablet Take 1 tablet (0.5 mg total) by mouth in the morning and at bedtime. 05/12/23  Yes Raulkar, Sven SQUIBB, MD  promethazine  (PHENERGAN ) 12.5 MG tablet Take 12.5 mg by mouth every 6 (six) hours as needed for nausea or vomiting. 05/27/24  Yes [provider]  tirzepatide  (MOUNJARO ) 5 MG/0.5ML Pen Inject 5 mg into the skin once a week. Patient taking differently: Inject 5 mg into the skin once a week. On Thursdays 05/16/24  Yes Delores Shields A, DO  tiZANidine  (ZANAFLEX ) 4 MG tablet Take 1 tablet (4 mg total) by mouth 3 (three) times daily. - for spasticity with baclofen  Patient taking differently: Take 4 mg by mouth 3 (three) times daily. 04/27/24  Yes Lovorn, Megan, MD  torsemide (DEMADEX) 100 MG tablet Take 100 mg by mouth daily. Patient not taking: Reported on 06/09/2024 05/27/24   [provider]  Vitamin D , Ergocalciferol , (DRISDOL ) 1.25 MG (50000 UNIT) CAPS capsule Take 1 capsule  (50,000 Units total) by mouth every 7 (seven) days. Patient not taking: Reported on 06/09/2024 05/16/24   Delores Shields LABOR, DO          CRITICAL CARE Lenny Drought, MD  [facility] Prefer epic messenger for cross cover needs   My critical care time: 35 minutes  Critical care time was exclusive of separately billable procedures and treating other patients.  Critical care was necessary to treat or prevent imminent or life-threatening deterioration.  Critical care was time spent personally by me on the following activities: development of treatment plan with patient and/or surrogate as well as nursing, discussions with consultants, evaluation of patient's response to treatment, examination of patient, obtaining history from patient or surrogate, ordering and performing treatments and interventions, ordering and review of laboratory studies, ordering and review of radiographic studies, pulse oximetry, re-evaluation of patient's condition and participation in multidisciplinary rounds.     [1]  Allergies Allergen Reactions   Haldol  [Haloperidol ] Other (See Comments)    Makes drowsy   Morphine  Other (See Comments)    Mental status changes - angry, mean    Sulfa  Antibiotics Hives   "

## 2024-06-10 NOTE — Progress Notes (Signed)
 [facility] ADULT ICU REPLACEMENT PROTOCOL   The patient does apply for the [facility] Adult ICU Electrolyte Replacment Protocol based on the criteria listed below:   1.Exclusion criteria: TCTS, ECMO, Dialysis, and Myasthenia Gravis patients 2. Is GFR >/= 30 ml/min? Yes.    Patient's GFR today is >60 3. Is SCr </= 2? Yes.   Patient's SCr is 1.27 mg/dL 4. Did SCr increase >/= 0.5 in 24 hours? No. 5.Pt's weight >40kg  Yes.   6. Abnormal electrolyte(s):    K 3.1, Mg 1.9, Phos 2.1  7. Electrolytes replaced per protocol 8.  Call MD STAT for K+ </= 2.5, Phos </= 1, or Mag </= 1 Physician:  CLEMENTEEN Epimenio Blackbird R Zimal Weisensel 06/10/2024 6:04 AM

## 2024-06-11 LAB — BASIC METABOLIC PANEL WITH GFR
Anion gap: 10 (ref 5–15)
BUN: 29 mg/dL — ABNORMAL HIGH (ref 6–20)
CO2: 28 mmol/L (ref 22–32)
Calcium: 9.3 mg/dL (ref 8.9–10.3)
Chloride: 95 mmol/L — ABNORMAL LOW (ref 98–111)
Creatinine, Ser: 1.51 mg/dL — ABNORMAL HIGH (ref 0.61–1.24)
GFR, Estimated: 53 mL/min — ABNORMAL LOW
Glucose, Bld: 126 mg/dL — ABNORMAL HIGH (ref 70–99)
Potassium: 3.7 mmol/L (ref 3.5–5.1)
Sodium: 133 mmol/L — ABNORMAL LOW (ref 135–145)

## 2024-06-11 LAB — GLUCOSE, CAPILLARY
Glucose-Capillary: 125 mg/dL — ABNORMAL HIGH (ref 70–99)
Glucose-Capillary: 142 mg/dL — ABNORMAL HIGH (ref 70–99)
Glucose-Capillary: 146 mg/dL — ABNORMAL HIGH (ref 70–99)
Glucose-Capillary: 148 mg/dL — ABNORMAL HIGH (ref 70–99)
Glucose-Capillary: 173 mg/dL — ABNORMAL HIGH (ref 70–99)
Glucose-Capillary: 195 mg/dL — ABNORMAL HIGH (ref 70–99)

## 2024-06-11 LAB — MAGNESIUM: Magnesium: 2.1 mg/dL (ref 1.7–2.4)

## 2024-06-11 LAB — CBC
HCT: 34.7 % — ABNORMAL LOW (ref 39.0–52.0)
Hemoglobin: 11.7 g/dL — ABNORMAL LOW (ref 13.0–17.0)
MCH: 30.2 pg (ref 26.0–34.0)
MCHC: 33.7 g/dL (ref 30.0–36.0)
MCV: 89.7 fL (ref 80.0–100.0)
Platelets: 264 K/uL (ref 150–400)
RBC: 3.87 MIL/uL — ABNORMAL LOW (ref 4.22–5.81)
RDW: 17.1 % — ABNORMAL HIGH (ref 11.5–15.5)
WBC: 12.4 K/uL — ABNORMAL HIGH (ref 4.0–10.5)
nRBC: 0 % (ref 0.0–0.2)

## 2024-06-11 LAB — PHOSPHORUS: Phosphorus: 3.2 mg/dL (ref 2.5–4.6)

## 2024-06-11 MED ORDER — ENSURE PLUS HIGH PROTEIN PO LIQD
237.0000 mL | Freq: Two times a day (BID) | ORAL | Status: DC
  Administered 2024-06-12 – 2024-06-13 (×3): 237 mL via ORAL

## 2024-06-11 MED ORDER — POTASSIUM CHLORIDE CRYS ER 20 MEQ PO TBCR
40.0000 meq | EXTENDED_RELEASE_TABLET | Freq: Once | ORAL | Status: AC
  Administered 2024-06-11: 40 meq via ORAL
  Filled 2024-06-11: qty 2

## 2024-06-11 MED ORDER — ATORVASTATIN CALCIUM 40 MG PO TABS
40.0000 mg | ORAL_TABLET | Freq: Every day | ORAL | Status: DC
  Administered 2024-06-11 – 2024-06-12 (×2): 40 mg via ORAL
  Filled 2024-06-11 (×2): qty 1

## 2024-06-11 MED ORDER — ALLOPURINOL 300 MG PO TABS
300.0000 mg | ORAL_TABLET | Freq: Every day | ORAL | Status: DC
  Administered 2024-06-11 – 2024-06-13 (×3): 300 mg via ORAL
  Filled 2024-06-11: qty 3
  Filled 2024-06-11 (×2): qty 1

## 2024-06-11 MED ORDER — POLYETHYLENE GLYCOL 3350 17 G PO PACK
17.0000 g | PACK | Freq: Once | ORAL | Status: AC
  Administered 2024-06-11: 17 g via ORAL
  Filled 2024-06-11: qty 1

## 2024-06-11 MED ORDER — PRAMIPEXOLE DIHYDROCHLORIDE 1 MG PO TABS
0.5000 mg | ORAL_TABLET | Freq: Two times a day (BID) | ORAL | Status: DC
  Administered 2024-06-11 – 2024-06-13 (×5): 0.5 mg via ORAL
  Filled 2024-06-11: qty 2
  Filled 2024-06-11 (×2): qty 1
  Filled 2024-06-11: qty 2
  Filled 2024-06-11: qty 1

## 2024-06-11 MED ORDER — FUROSEMIDE 40 MG PO TABS
80.0000 mg | ORAL_TABLET | Freq: Two times a day (BID) | ORAL | Status: DC
  Administered 2024-06-11 – 2024-06-13 (×4): 80 mg via ORAL
  Filled 2024-06-11 (×4): qty 2

## 2024-06-11 NOTE — Progress Notes (Signed)
" °   06/11/24 2020  BiPAP/CPAP/SIPAP  $ Non-Invasive Home Ventilator  Initial  BiPAP/CPAP/SIPAP Pt Type Adult  BiPAP/CPAP/SIPAP Resmed  Mask Type Full face mask  Dentures removed? Not applicable  Mask Size Large  EPAP 8 cmH2O  FiO2 (%) 21 %  Patient Home Machine Yes  Safety Check Completed by RT for Home Unit Yes, no issues noted  Patient Home Mask Yes  Patient Home Tubing Yes  Auto Titrate No  CPAP/SIPAP surface wiped down Yes  Device Plugged into RED Power Outlet Yes    "

## 2024-06-11 NOTE — Evaluation (Signed)
 Physical Therapy Evaluation Patient Details Name: Raymond [name] MRN: [id number] DOB: June 07, 1965 Today's Date: 06/11/2024  History of Present Illness  59 year old man who presented to [facility] 1/8 with AMS. PMHx significant for HTN, HLD, COPD, OSA (on CPAP), T2DM, CKD stage 3a, iron deficiency anemia, GIB, GERD, RLS, substance abuse. Recent admissions to [facility] 12/18-12/22 for similar.   Clinical Impression  Pt admitted with above diagnosis. PTA he was able to transfer himself with a sliding board regularly and stand pivot transfer with a RW inconsistently. He had been working diligently with OPPT and making great progress, even ambulating up to 30+ feet last week with the therapist. During my evaluation today, pt has obvious LE weakness preventing him from being able to stand on his own. Required max assist and Stedy to stand x2. ICU bed/chair incompatible with sliding board transfers today. We will need to assess if he can still use sliding board for transfers prior to d/c. I anticipate he will as his UE strength seems preserved. If he fails to demonstrate this adequately, Dolby need to plan for hoyer lift and HHPT, vs post acute rehab. Will make every attempt to assess tomorrow. Pt currently with functional limitations due to the deficits listed below (see PT Problem List). Pt will benefit from acute skilled PT to increase their independence and safety with mobility to allow discharge.           If plan is discharge home, recommend the following: Assistance with cooking/housework;Assist for transportation;A lot of help with walking and/or transfers;A lot of help with bathing/dressing/bathroom   Can travel by private vehicle        Equipment Recommendations Hoyer lift (Pending ability to transfer with sliding board next visit.)  Recommendations for Other Services  OT consult    Functional Status Assessment Patient has had a recent decline in their functional status and demonstrates the ability to make  significant improvements in function in a reasonable and predictable amount of time.     Precautions / Restrictions Precautions Precautions: Fall Recall of Precautions/Restrictions: Intact Restrictions Weight Bearing Restrictions Per Provider Order: No      Mobility  Bed Mobility Overal bed mobility: Needs Assistance Bed Mobility: Supine to Sit     Supine to sit: Min assist, Used rails, HOB elevated     General bed mobility comments: Min assist for LLE suppor to reach EOB, pt pulls lightly through therapist's hand to scoot forward. Required extra time, somewhat effortful.    Transfers Overall transfer level: Needs assistance Equipment used: Rolling walker (2 wheels), Ambulation equipment used Transfers: Sit to/from Stand, Bed to chair/wheelchair/BSC Sit to Stand: Max assist, From elevated surface, Via lift equipment           General transfer comment: Two attempts to stand with RW from edge of bed, could not fully extend knees to straighten. Max assist for boost to stand with Stedy. Hoyer pad placed in low chair for nursing to transfer back to bed. Equipment not compatible for sliding board transfer. Transfer via Lift Equipment: Stedy  Ambulation/Gait               General Gait Details: unable to safely perform at this time.  Stairs            Wheelchair Mobility     Tilt Bed    Modified Rankin (Stroke Patients Only)       Balance Overall balance assessment: Needs assistance Sitting-balance support: Feet supported, No upper extremity supported Sitting balance-Leahy Scale: Fair  Standing balance support: Bilateral upper extremity supported, Reliant on assistive device for balance Standing balance-Leahy Scale: Poor Standing balance comment: Once upright, reliant on Stedy to remain standing.                             Pertinent Vitals/Pain Pain Assessment Pain Assessment: Faces Faces Pain Scale: Hurts a little bit Pain  Location: back Pain Descriptors / Indicators: Aching Pain Intervention(s): Limited activity within patient's tolerance, Monitored during session, Repositioned    Home Living Family/patient expects to be discharged to:: Private residence Living Arrangements: Spouse/significant other Available Help at Discharge: Family;Available 24 hours/day Type of Home: House Home Access: Ramped entrance       Home Layout: One level Home Equipment: Agricultural Consultant (2 wheels);BSC/3in1;Wheelchair - Physiological Scientist (4 wheels);Wheelchair - power Additional Comments: Pt lives with wife who can assist as needed, daughter able to assist as well. pt and spouse report pt progrssing with OPPT services and able to ambulate short distances walked up to 30 feet last week    Prior Function Prior Level of Function : Needs assist             Mobility Comments: Stand pivot transfer with RW. Walking with therapies only. ADLs Comments: Pt needs help with dressing/bathing. Wife assists with toileting care/hygiene     Extremity/Trunk Assessment   Upper Extremity Assessment Upper Extremity Assessment: Defer to OT evaluation    Lower Extremity Assessment Lower Extremity Assessment: Generalized weakness       Communication   Communication Communication: No apparent difficulties    Cognition Arousal: Alert Behavior During Therapy: WFL for tasks assessed/performed   PT - Cognitive impairments: No apparent impairments                         Following commands: Intact       Cueing Cueing Techniques: Verbal cues     General Comments General comments (skin integrity, edema, etc.): HR elevated to 140s in standing. SpO2 95% on RA. Tolerated standing for approx 1 minute while assisting with peri-care.    Exercises General Exercises - Lower Extremity Ankle Circles/Pumps: AROM, Both, 10 reps, Seated Quad Sets: Strengthening, Both, 10 reps, Seated Gluteal Sets: Strengthening, Both, Seated, 10  reps Long Arc Quad: Strengthening, Both, 10 reps, Seated   Assessment/Plan    PT Assessment Patient needs continued PT services  PT Problem List Decreased strength;Decreased range of motion;Decreased activity tolerance;Decreased balance;Decreased mobility;Decreased knowledge of use of DME;Cardiopulmonary status limiting activity;Obesity       PT Treatment Interventions DME instruction;Functional mobility training;Therapeutic activities;Therapeutic exercise;Balance training;Neuromuscular re-education;Patient/family education;Gait training;Wheelchair mobility training    PT Goals (Current goals can be found in the Care Plan section)  Acute Rehab PT Goals Patient Stated Goal: Go home, return to OPPT, walk again PT Goal Formulation: With patient Time For Goal Achievement: 06/25/24 Potential to Achieve Goals: Good    Frequency Min 2X/week     Co-evaluation               AM-PAC PT 6 Clicks Mobility  Outcome Measure Help needed turning from your back to your side while in a flat bed without using bedrails?: A Little Help needed moving from lying on your back to sitting on the side of a flat bed without using bedrails?: A Little Help needed moving to and from a bed to a chair (including a wheelchair)?: Total Help needed standing up from a chair  using your arms (e.g., wheelchair or bedside chair)?: A Lot Help needed to walk in hospital room?: Total Help needed climbing 3-5 steps with a railing? : Total 6 Click Score: 11    End of Session Equipment Utilized During Treatment: Gait belt Activity Tolerance: Patient tolerated treatment well (Weakness) Patient left: in chair;with call bell/phone within reach;with chair alarm set;with SCD's reapplied Nurse Communication: Mobility status;Need for lift equipment (hoyer pad in place for return to bed.) PT Visit Diagnosis: Unsteadiness on feet (R26.81);Other abnormalities of gait and mobility (R26.89);Muscle weakness (generalized)  (M62.81);Difficulty in walking, not elsewhere classified (R26.2)    Time: 9146-9067 PT Time Calculation (min) (ACUTE ONLY): 39 min   Charges:   PT Evaluation $PT Eval Low Complexity: 1 Low PT Treatments $Therapeutic Exercise: 8-22 mins $Therapeutic Activity: 8-22 mins PT General Charges $$ ACUTE PT VISIT: 1 Visit         Leontine Roads, PT, DPT [facility]  Rehabilitation Services Physical Therapist Office: [phone] Website: [email or web address]   Leontine GORMAN Roads 06/11/2024, 10:48 AM

## 2024-06-11 NOTE — Progress Notes (Signed)
 "  NAME:  Cris Talavera Knape, MRN:  [id number], DOB:  June 13, 1965, LOS: 2 ADMISSION DATE:  06/09/2024, CONSULTATION DATE: 06/09/2024 REFERRING MD:   Dreama , CHIEF COMPLAINT: AMS  History of Present Illness:  59 year old man who presented to [facility] 1/8 with AMS. PMHx significant for HTN, HLD, COPD, OSA (on CPAP), T2DM, CKD stage 3a, iron deficiency anemia, GIB, GERD, RLS, substance abuse. Recent admissions to [facility] 12/18-12/22 for similar History is obtained primarily from chart review. Per EMS, patient's LKW was 0130 1/8, found by family around 1200 with AMS. Vitals signs and CBG were WNL; however, patient was noted to have poor respiratory effort prompting assisted ventilations with BVM. On arrival to ED, patient was afebrile with HR 71, BP 124/75, RR 16, SpO2 98%. Labs were notable for WBC 9.3, Hgb 12.2, Plt 252. Na 139, K 3.9, CO2 31, BUN 37/Cr 1.55 (baseline 1.2-1.4), AST/ALT WNL, Alk Phos 135, Tbili 0.3. TSH 2.630, free T4 1.34. LA 1.1. UDS +BZDs (home medication). Ammonia 90. UA pending. CXR unremarkable. CT Head NAICA. GCS 8. Narcan  was administered with some improvement in mental status. Placed on BiPAP, patient then began to wake up and became agitated requiring Haldol  and Versed  administration.  Pertinent  Medical History    Acute kidney injury superimposed on chronic kidney disease 04/13/2023    Acute on chronic anemia 04/13/2023   Anemia     Anxiety     Back pain     COPD (chronic obstructive pulmonary disease) (HCC)     Depression     Diabetes mellitus without complication (HCC)     Diverticulosis      with hx of LGIB   Edema     GERD (gastroesophageal reflux disease)     GIB (gastrointestinal bleeding)     Gout     Heavy smoker     Hyperlipidemia     Hypertension     Iron deficiency anemia     Sleep apnea      uses a cpap   Swelling of lower extremity     Significant Hospital Events: Including procedures, antibiotic start and stop dates in addition to other pertinent events   1/8  - Presented to [facility] via EMS for AMS. GCS 8, required assisted ventilations with BVM. Narcan  with improvement to GCS 11. Placed on BiPAP. Agitated. Haldol /Versed  given. PCCM consulted.   Interim History / Subjective:  Mentation improved, now conversational AO X3 Sats well on nasal cannula  Objective    Blood pressure (!) 164/66, pulse 89, temperature 99.4 F (37.4 C), temperature source Oral, resp. rate 14, height 5' 11 (1.803 m), weight (!) 138.3 kg, SpO2 92%.        Intake/Output Summary (Last 24 hours) at 06/11/2024 0718 Last data filed at 06/11/2024 0545 Gross per 24 hour  Intake 2348.14 ml  Output 2750 ml  Net -401.86 ml   Filed Weights   06/09/24 2223 06/10/24 0300 06/11/24 0431  Weight: (!) 138.3 kg (!) 138.3 kg (!) 138.3 kg    Examination: General: Lying in bed, not in distress HEENT: NCAT Lungs: Symmetrical chest wall movement, clear to auscultation Cardiovascular: S1-S2 normal, no murmur Abdomen: Obese, soft and nontender nondistended Extremities: Warm, trace peripheral edema Neuro: AO X3, following commands, move all extremities GU: Deferred   Resolved problem list   Assessment and Plan   59 year old male with history of HTN, COPD, OSA-CPAP, DM2, CKD 3 AA who was brought in for altered mental status due to polypharmacy-now improving  Acute metabolic  encephalopathy-improving likely due to polypharmacy History of polysubstance use-home Precedex  baclofen , Xanax  Hyperammonemia-improved AKI on CKD 3 A DM 2   Plan -Mentation improving, hemodynamically stable -Off Cleviprex , on p.o. HTN -  BiPAP nightly, nasal cannula during daytime - Continue pulse oximetry and cardiac monitoring - Delirium precaution -monitor I&O's - Glucose monitoring - Monitor and replace electrolyte as needed - Transfer to medical floor   Patient's wife at the bedside,.  She reports patient is chronically on pain meds for low back pain follows with pain management.  She reports  though patient is on as needed pain meds sometimes will take more due to low back pain and would make him lethargic/confused.  Went over in detail side effect of pain meds, she tells me patient has an upcoming appointment with pain management.  She is agreeable with the plan of trying to reduce pain meds and see how he does.  Today patient is more awake, conversational.  Spoke to patient at the bedside, patient reports that he thinks he unintentionally took more than required baclofen  for muscle spasm.  Explained in detail side effect of pain meds and baclofen  especially with his low GFR.  He is aware this is the second time that he has been admitted for altered mental status due to polypharmacy.  He promises to be more careful with his PRN meds, and will involve his wife to help in case he forgets    Labs   CBC: Recent Labs  Lab 06/09/24 1253 06/09/24 1254 06/09/24 1328 06/10/24 0300 06/11/24 0210  WBC  --   --  9.3 13.7* 12.4*  NEUTROABS  --   --  6.2  --   --   HGB 12.9* 12.9* 12.2* 13.4 11.7*  HCT 38.0* 38.0* 37.2* 39.7 34.7*  MCV  --   --  91.4 87.8 89.7  PLT  --   --  252 299 264    Basic Metabolic Panel: Recent Labs  Lab 06/09/24 1253 06/09/24 1254 06/09/24 1328 06/09/24 2113 06/10/24 0300 06/11/24 0210  NA 139 139 139  --  142 133*  K 3.8 3.8 3.9  --  3.1* 3.7  CL 97*  --  98  --  100 95*  CO2  --   --  31  --  30 28  GLUCOSE 157*  --  155*  --  157* 126*  BUN 41*  --  37*  --  26* 29*  CREATININE 1.70*  --  1.55*  --  1.27* 1.51*  CALCIUM   --   --  9.5  --  10.1 9.3  MG  --   --   --  2.1 1.9 2.1  PHOS  --   --   --   --  2.1* 3.2   GFR: Estimated Creatinine Clearance: 74 mL/min (A) (by C-G formula based on SCr of 1.51 mg/dL (H)). Recent Labs  Lab 06/09/24 1258 06/09/24 1328 06/09/24 2113 06/10/24 0300 06/11/24 0210  PROCALCITON  --   --  <0.10  --   --   WBC  --  9.3  --  13.7* 12.4*  LATICACIDVEN 1.1  --   --   --   --     Liver Function  Tests: Recent Labs  Lab 06/09/24 1328 06/10/24 0300  AST 18 20  ALT 15 16  ALKPHOS 135* 133*  BILITOT 0.3 0.4  PROT 7.4 7.7  ALBUMIN  3.7 3.8   No results for input(s): LIPASE, AMYLASE in the last 168  hours. Recent Labs  Lab 06/09/24 2017 06/10/24 0300  AMMONIA 90* 25    ABG    Component Value Date/Time   HCO3 33.5 (H) 06/10/2024 1548   TCO2 36 (H) 06/09/2024 1254   O2SAT 80.2 06/10/2024 1548     Coagulation Profile: No results for input(s): INR, PROTIME in the last 168 hours.  Cardiac Enzymes: No results for input(s): CKTOTAL, CKMB, CKMBINDEX, TROPONINI in the last 168 hours.  HbA1C: Hgb A1c MFr Bld  Date/Time Value Ref Range Status  05/22/2024 03:46 AM 6.7 (H) 4.8 - 5.6 % Final    Comment:    (NOTE) Diagnosis of Diabetes The following HbA1c ranges recommended by the American Diabetes Association (ADA) [place] be used as an aid in the diagnosis of diabetes mellitus.  Hemoglobin             Suggested A1C NGSP%              Diagnosis  <5.7                   Non Diabetic  5.7-6.4                Pre-Diabetic  >6.4                   Diabetic  <7.0                   Glycemic control for                       adults with diabetes.    03/12/2023 06:20 AM 6.0 (H) 4.8 - 5.6 % Final    Comment:    (NOTE) Pre diabetes:          5.7%-6.4%  Diabetes:              >6.4%  Glycemic control for   <7.0% adults with diabetes     CBG: Recent Labs  Lab 06/10/24 1124 06/10/24 1506 06/10/24 1923 06/10/24 2322 06/11/24 0320  GLUCAP 161* 108* 177* 167* 195*    Review of Systems:   Negative except above  Past Medical History:  He,  has a past medical history of Acute kidney injury superimposed on chronic kidney disease (04/13/2023), Acute on chronic anemia (04/13/2023), Anemia, Anxiety, Back pain, COPD (chronic obstructive pulmonary disease) (HCC), Depression, Diabetes mellitus without complication (HCC), Diverticulosis, Edema, GERD  (gastroesophageal reflux disease), GIB (gastrointestinal bleeding), Gout, Heavy smoker, Hyperlipidemia, Hypertension, Iron deficiency anemia, Sleep apnea, and Swelling of lower extremity.   Surgical History:   Past Surgical History:  Procedure Laterality Date   CHONDROPLASTY Left 06/28/2014   Procedure: CHONDROPLASTY;  Surgeon: LELON JONETTA Shari Mickey., MD;  Location: [facility];  Service: Orthopedics;  Laterality: Left;   COLONOSCOPY     FOOT FASCIOTOMY     age 43-rt   KNEE ARTHROSCOPY WITH LATERAL MENISECTOMY Left 06/28/2014   Procedure: KNEE ARTHROSCOPY WITH LATERAL MENISECTOMY;  Surgeon: LELON JONETTA Shari Mickey., MD;  Location: [facility];  Service: Orthopedics;  Laterality: Left;   KNEE ARTHROSCOPY WITH MEDIAL MENISECTOMY Left 06/28/2014   Procedure: LEFT KNEE ARTHROSCOPY CHONDROPLASTY/WITH MEDIAL/LATERAL MENISECTOMIES;  Surgeon: LELON JONETTA Shari Mickey., MD;  Location: [facility];  Service: Orthopedics;  Laterality: Left;   ORIF RADIUS & ULNA FRACTURES  2007   left   THORACIC DISCECTOMY N/A 03/12/2023   Procedure: THORACIC LAMINECTOMY AND  DISCECTOMY;  Surgeon: Gillie Duncans, MD;  Location: [facility]  OR;  Service: Neurosurgery;  Laterality: N/A;     Social History:   reports that he has quit smoking. His smoking use included cigarettes. He has never used smokeless tobacco. He reports current alcohol use. He reports that he does not use drugs.   Family History:  His family history includes Cancer in an other family member; Depression in his mother; Diabetes in his father and mother; Heart disease in his father and mother; High Cholesterol in his father and mother; Hypertension in his father and mother.   Allergies Allergies[1]   Home Medications  Prior to Admission medications  Medication Sig Start Date End Date Taking? Authorizing Provider  albuterol  (VENTOLIN  HFA) 108 (90 Base) MCG/ACT inhaler Inhale 2 puffs into the lungs every 6 (six) hours as needed for wheezing or  shortness of breath. 05/12/23  Yes Raulkar, Sven SQUIBB, MD  allopurinol  (ZYLOPRIM ) 300 MG tablet Take 1 tablet (300 mg total) by mouth daily. 05/12/23  Yes Raulkar, Sven SQUIBB, MD  ALPRAZolam  (XANAX ) 0.5 MG tablet Take 1 tablet (0.5 mg total) by mouth 2 (two) times daily as needed. 05/12/23  Yes Raulkar, Sven SQUIBB, MD  amLODipine  (NORVASC ) 10 MG tablet Take 10 mg by mouth at bedtime. 07/20/23  Yes [provider]  aspirin  EC 81 MG tablet Take 81 mg by mouth daily. Swallow whole.   Yes [provider]  atorvastatin  (LIPITOR) 40 MG tablet Take 1 tablet (40 mg total) by mouth every evening. Patient taking differently: Take 40 mg by mouth at bedtime. 05/12/23  Yes Raulkar, Sven SQUIBB, MD  baclofen  (LIORESAL ) 20 MG tablet Take 1 tablet (20 mg total) by mouth 3 (three) times daily. Patient taking differently: Take 20 mg by mouth daily as needed for muscle spasms. 04/27/24  Yes Lovorn, Megan, MD  carvedilol  (COREG ) 25 MG tablet Take 1 tablet (25 mg total) by mouth in the morning and at bedtime. 05/12/23  Yes Raulkar, Sven SQUIBB, MD  diclofenac  Sodium (VOLTAREN ) 1 % GEL Apply 4 g topically 4 (four) times daily. To bilateral knees Patient taking differently: Apply 1 Application topically as needed (pain). To bilateral knees 05/12/23  Yes Raulkar, Sven SQUIBB, MD  doxazosin  (CARDURA ) 2 MG tablet Take 1 tablet (2 mg total) by mouth at bedtime. Patient taking differently: Take 2 mg by mouth 2 (two) times daily. 05/12/23  Yes Raulkar, Sven SQUIBB, MD  DULoxetine  (CYMBALTA ) 60 MG capsule Take 60 mg by mouth daily.   Yes [provider]  FIBER PO Take 1-3 tablets by mouth daily. Depending on constipation   Yes [provider]  furosemide  (LASIX ) 80 MG tablet Take 160 mg by mouth 2 (two) times daily.   Yes [provider]  hydrALAZINE  (APRESOLINE ) 50 MG tablet Take 1 tablet (50 mg total) by mouth 3 (three) times daily. Patient taking differently: Take 50 mg by mouth daily in the  afternoon. 05/23/24  Yes Verdene Purchase, MD  lidocaine  (LIDODERM ) 5 % Place 2 patches onto the skin daily at 10 pm. Remove & Discard patch within 12 hours or as directed by MD Patient taking differently: Place 2 patches onto the skin as needed. Remove & Discard patch within 12 hours or as directed by MD 05/12/23  Yes Raulkar, Sven SQUIBB, MD  metolazone (ZAROXOLYN) 2.5 MG tablet Take 2.5 mg by mouth as needed (only when Lasix  does not work).   Yes [provider]  omeprazole (PRILOSEC) 40 MG capsule Take 40 mg by mouth in the morning and at bedtime.  07/02/23  Yes [provider]  ondansetron  (ZOFRAN ) 4 MG tablet Take 1 tablet (4 mg total) by mouth every 8 (eight) hours as needed for nausea or vomiting. 04/18/24  Yes Delores Shields A, DO  oxyCODONE -acetaminophen  (PERCOCET) 10-325 MG tablet Take 1 tablet by mouth every 6 (six) hours as needed for pain. 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 04/27/24  Yes Lovorn, Megan, MD  polyethylene glycol (MIRALAX ) 17 g packet Take 17 g by mouth 2 (two) times daily. Patient taking differently: Take 17 g by mouth daily as needed for mild constipation. 03/23/24  Yes Yolande Lamar BROCKS, MD  Potassium Chloride  ER 20 MEQ TBCR Take 2 tablets by mouth in the morning and at bedtime.   Yes [provider]  pramipexole  (MIRAPEX ) 0.5 MG tablet Take 1 tablet (0.5 mg total) by mouth in the morning and at bedtime. 05/12/23  Yes Raulkar, Sven SQUIBB, MD  promethazine  (PHENERGAN ) 12.5 MG tablet Take 12.5 mg by mouth every 6 (six) hours as needed for nausea or vomiting. 05/27/24  Yes [provider]  tirzepatide  (MOUNJARO ) 5 MG/0.5ML Pen Inject 5 mg into the skin once a week. Patient taking differently: Inject 5 mg into the skin once a week. On Thursdays 05/16/24  Yes Delores Shields A, DO  tiZANidine  (ZANAFLEX ) 4 MG tablet Take 1 tablet (4 mg total) by  mouth 3 (three) times daily. - for spasticity with baclofen  Patient taking differently: Take 4 mg by mouth 3 (three) times daily. 04/27/24  Yes Lovorn, Megan, MD  torsemide (DEMADEX) 100 MG tablet Take 100 mg by mouth daily. Patient not taking: Reported on 06/09/2024 05/27/24   [provider]  Vitamin D , Ergocalciferol , (DRISDOL ) 1.25 MG (50000 UNIT) CAPS capsule Take 1 capsule (50,000 Units total) by mouth every 7 (seven) days. Patient not taking: Reported on 06/09/2024 05/16/24   Delores Shields LABOR, DO          CRITICAL CARE Lenny Drought, MD  [facility] Prefer epic messenger for cross cover needs   My critical care time: 30 minutes  Critical care time was exclusive of separately billable procedures and treating other patients.  Critical care was necessary to treat or prevent imminent or life-threatening deterioration.  Critical care was time spent personally by me on the following activities: development of treatment plan with patient and/or surrogate as well as nursing, discussions with consultants, evaluation of patient's response to treatment, examination of patient, obtaining history from patient or surrogate, ordering and performing treatments and interventions, ordering and review of laboratory studies, ordering and review of radiographic studies, pulse oximetry, re-evaluation of patient's condition and participation in multidisciplinary rounds.     [1]  Allergies Allergen Reactions   Haldol  [Haloperidol ] Other (See Comments)    Makes drowsy   Morphine  Other (See Comments)    Mental status changes - angry, mean    Sulfa  Antibiotics Hives   "

## 2024-06-12 LAB — GLUCOSE, CAPILLARY
Glucose-Capillary: 148 mg/dL — ABNORMAL HIGH (ref 70–99)
Glucose-Capillary: 178 mg/dL — ABNORMAL HIGH (ref 70–99)
Glucose-Capillary: 138 mg/dL — ABNORMAL HIGH (ref 70–99)
Glucose-Capillary: 167 mg/dL — ABNORMAL HIGH (ref 70–99)
Glucose-Capillary: 130 mg/dL — ABNORMAL HIGH (ref 70–99)

## 2024-06-12 MED ORDER — ALPRAZOLAM 0.5 MG PO TABS
0.5000 mg | ORAL_TABLET | Freq: Two times a day (BID) | ORAL | Status: DC | PRN
  Administered 2024-06-12 – 2024-06-13 (×2): 0.5 mg via ORAL
  Filled 2024-06-12 (×2): qty 1

## 2024-06-12 MED ORDER — IPRATROPIUM-ALBUTEROL 0.5-2.5 (3) MG/3ML IN SOLN: 3.0000 mL | RESPIRATORY_TRACT | Status: DC | PRN

## 2024-06-12 NOTE — Progress Notes (Signed)
 Physical Therapy Treatment Patient Details Name: Raymond [name] MRN: [id number] DOB: 1965/08/21 Today's Date: 06/12/2024   History of Present Illness 59 year old man who presented to [facility] 1/8 with AMS. PMHx significant for HTN, HLD, COPD, OSA (on CPAP), T2DM, CKD stage 3a, iron deficiency anemia, GIB, GERD, RLS, substance abuse. Recent admissions to [facility] 12/18-12/22 for similar.    PT Comments  Continuing work on functional mobility and activity tolerance;  Seen for second PT session for assist back to bed; smoother lateral scooting with sliding board, including going at a slightly uphill incline; Making solid progress; will need to see how he does with lateral scooting to from drop-arm [facility]    If plan is discharge home, recommend the following: Assistance with cooking/housework;Assist for transportation;A lot of help with walking and/or transfers;A lot of help with bathing/dressing/bathroom   Can travel by private vehicle        Equipment Recommendations  None recommended by PT (Pretty well-equipped; do not consider hoyer lift necessary at this time)    Recommendations for Other Services OT consult     Precautions / Restrictions Precautions Precautions: Fall Recall of Precautions/Restrictions: Intact Restrictions Weight Bearing Restrictions Per Provider Order: No     Mobility  Bed Mobility Overal bed mobility: Needs Assistance Bed Mobility: Sit to Supine     Supine to sit: Min assist, Used rails, HOB elevated Sit to supine: Mod assist   General bed mobility comments: Light mod assist to help his LEs into bed    Transfers Overall transfer level: Needs assistance Equipment used: Sliding board Transfers: Bed to chair/wheelchair/BSC Sit to Stand: Max assist          Lateral/Scoot Transfers: With slide board, Mod assist General transfer comment: Light mod assist for lateral scoot with sliding board back to bed, uphill, but very slight incline    Ambulation/Gait                    Stairs             Wheelchair Mobility     Tilt Bed    Modified Rankin (Stroke Patients Only)       Balance     Sitting balance-Leahy Scale: Fair (approaching Good)                                      Communication Communication Communication: No apparent difficulties  Cognition Arousal: Alert Behavior During Therapy: WFL for tasks assessed/performed   PT - Cognitive impairments: Memory                       PT - Cognition Comments: Pt askes about getting an xray of his R foot due to 4th toe pain; noted he had an xray of the R foot last admission on 12/21 Following commands: Intact      Cueing Cueing Techniques: Verbal cues  Exercises      General Comments General comments (skin integrity, edema, etc.): NT came in at end of session and assisted pt onto bed pan      Pertinent Vitals/Pain Pain Assessment Pain Assessment: Faces Pain Score: 7  Faces Pain Scale: Hurts a little bit Pain Location: Right 4th toe with PROM, tender to palpation, painful with weight bearing during transfer Pain Descriptors / Indicators: Sharp (subsides quickly) Pain Intervention(s): Monitored during session, Repositioned    Home Living  Prior Function            PT Goals (current goals can now be found in the care plan section) Acute Rehab PT Goals Patient Stated Goal: Go home, return to OPPT, walk again PT Goal Formulation: With patient Time For Goal Achievement: 06/25/24 Potential to Achieve Goals: Good Progress towards PT goals: Progressing toward goals    Frequency    Min 2X/week      PT Plan      Co-evaluation              AM-PAC PT 6 Clicks Mobility   Outcome Measure  Help needed turning from your back to your side while in a flat bed without using bedrails?: A Little Help needed moving from lying on your back to sitting on the side of a flat bed without using  bedrails?: A Little Help needed moving to and from a bed to a chair (including a wheelchair)?: A Lot Help needed standing up from a chair using your arms (e.g., wheelchair or bedside chair)?: Total Help needed to walk in hospital room?: Total Help needed climbing 3-5 steps with a railing? : Total 6 Click Score: 11    End of Session Equipment Utilized During Treatment: Other (comment) (sliding board) Activity Tolerance: Patient tolerated treatment well (Weakness) Patient left: in bed;with call bell/phone within reach;with bed alarm set Nurse Communication: Mobility status (Considerations for back to bed) PT Visit Diagnosis: Unsteadiness on feet (R26.81);Other abnormalities of gait and mobility (R26.89);Muscle weakness (generalized) (M62.81);Difficulty in walking, not elsewhere classified (R26.2)     Time: 8940-8879 PT Time Calculation (min) (ACUTE ONLY): 21 min  Charges:    $Therapeutic Activity: 8-22 mins PT General Charges $$ ACUTE PT VISIT: 1 Visit                     Raymond [name], PT  Acute Rehabilitation Services Office [phone] Secure Chat welcomed    Raymond [name] 06/12/2024, 11:45 AM

## 2024-06-12 NOTE — Progress Notes (Signed)
 "                                                                                                                                                                                                                                                                                PROGRESS NOTE     Patient Demographics:    Raymond [name], is a 59 y.o. male, DOB - 1965-11-14, FMW:[id number]  Outpatient Primary MD for the patient is [name], MD    LOS - 3  Admit date - 06/09/2024    Chief Complaint  Patient presents with   Altered Mental Status       Brief Narrative (HPI from H&P)    60 year old man who presented to [facility] 1/8 with AMS. PMHx significant for HTN, HLD, COPD, OSA (on CPAP), T2DM, CKD stage 3a, iron deficiency anemia, GIB, GERD, RLS, substance abuse. Recent admissions to [facility] 12/18-12/22 for similar History is obtained primarily from chart review. Per EMS, patient's LKW was 0130 1/8, found by family around 1200 with AMS. Vitals signs and CBG were WNL; however, patient was noted to have poor respiratory effort prompting assisted ventilations with BVM. On arrival to ED, patient was afebrile with HR 71, BP 124/75, RR 16, SpO2 98%. Labs were notable for WBC 9.3, Hgb 12.2, Plt 252. Na 139, K 3.9, CO2 31, BUN 37/Cr 1.55 (baseline 1.2-1.4), AST/ALT WNL, Alk Phos 135, Tbili 0.3. TSH 2.630, free T4 1.34. LA 1.1. UDS +BZDs (home medication). Ammonia 90. UA pending. CXR unremarkable. CT Head NAICA. GCS 8. Narcan  was administered with some improvement in mental status. Placed on BiPAP, patient then began to wake up and became agitated requiring Haldol  and Versed  administration.   Subjective:   Seen and evaluated this morning.  Patient lucid answering questions appropriately.  Does not offer any complaints.   Assessment  & Plan :   Assessment & Plan Acute encephalopathy  #Acute metabolic encephalopathy, improving #History of substance abuse Likely related to polypharmacy.  He reports that he  unintentionally took more baclofen  than prescribed.  Discussed with patient that he needs to be more careful given his kidney disease.  Now seems to be back at baseline.  Had a similar admission in December.  #CKD stage IIIb - Creatinine at baseline.  Monitor   #COPD -Currently stable. -DuoNebs as needed   #Obesity class III - Outpatient follow-up -On Mounjaro    #Physical deconditioning PT eval  #Anxiety and depression Continue alprazolam  as needed   Prolonged QT -Continue telemetry monitoring.  Replace electrolytes as needed  Patient Lines/Drains/Airways Status     Active Line/Drains/Airways     Name Placement date Placement time Site Days   Peripheral IV 06/09/24 20 G 2.5 Anterior;Left Forearm 06/09/24  2040  Forearm  3   Peripheral IV 06/10/24 22 G 1.75 Right;Lateral Forearm 06/10/24  0651  Forearm  2   External Urinary Catheter 06/09/24  2240  --  3   Wound 06/11/24 2000 Irritant Contact Dermatitis Buttocks Left 06/11/24  2000  Buttocks  1              Nutrition Problem:        Obesity: Estimated body mass index is 42.52 kg/m as calculated from the following:   Height as of this encounter: 5' 11 (1.803 m).   Weight as of this encounter: 138.3 kg.          Condition - Extremely Guarded  Family Communication  : Wife updated at bedside  Code Status : Full code        Disposition Plan  :    Status is: Inpatient    DVT Prophylaxis  :    heparin  injection 5,000 Units Start: 06/10/24 0600 SCDs Start: 06/09/24 2109    Lab Results  Component Value Date   PLT 264 06/11/2024    Diet :  Diet Order             Diet Carb Modified Room service appropriate? Yes  Diet effective now                    Inpatient Medications  Scheduled Meds:  allopurinol   300 mg Oral Daily   amLODipine   10 mg Oral Daily   atorvastatin   40 mg Oral Daily   carvedilol   25 mg Oral BID WC   Chlorhexidine  Gluconate Cloth  6 each Topical Daily    doxazosin   2 mg Oral Daily   feeding supplement  237 mL Oral BID BM   furosemide   80 mg Oral BID   heparin   5,000 Units Subcutaneous Q8H   hydrALAZINE   50 mg Oral Q8H   insulin  aspart  0-9 Units Subcutaneous Q4H   pramipexole   0.5 mg Oral BID   Continuous Infusions: PRN Meds:.ALPRAZolam , hydrALAZINE , labetalol , ondansetron  (ZOFRAN ) IV, mouth rinse, polyethylene glycol, senna  Antibiotics  :    Anti-infectives (From admission, onward)    None         Objective:   Vitals:   06/12/24 0227 06/12/24 0459 06/12/24 0855 06/12/24 1510  BP: (!) 159/83 (!) 164/86 (!) 147/75 (!) 159/72  Pulse: 83 (!) 101 95 74  Resp: 16 18 17 20   Temp: 98 F (36.7 C) 98.1 F (36.7 C) 98.2 F (36.8 C) 98.6 F (37 C)  TempSrc: Oral Oral  Oral  SpO2: 96% 98% 95% 95%  Weight:      Height:        Wt Readings from Last 3 Encounters:  06/11/24 (!) 138.3 kg  05/23/24 (!) 139 kg  05/16/24 (!) 146.1 kg     Intake/Output Summary (Last 24 hours) at 06/12/2024 1742 Last data filed at 06/12/2024 1736 Gross per 24 hour  Intake 1230 ml  Output 6700 ml  Net -5470 ml     Physical Exam  Awake Alert, No new F.N deficits, Normal affect [place].AT,PERRAL Supple Neck, No JVD,   Symmetrical Chest wall movement, Good air movement bilaterally, CTAB RRR,No Gallops,Rubs or new Murmurs,  +ve B.Sounds, Abd Soft, No tenderness,   No Cyanosis, Clubbing or edema     RN pressure injury documentation:      Data Review:    Recent Labs  Lab 06/09/24 1253 06/09/24 1254 06/09/24 1328 06/10/24 0300 06/11/24 0210  WBC  --   --  9.3 13.7* 12.4*  HGB 12.9* 12.9* 12.2* 13.4 11.7*  HCT 38.0* 38.0* 37.2* 39.7 34.7*  PLT  --   --  252 299 264  MCV  --   --  91.4 87.8 89.7  MCH  --   --  30.0 29.6 30.2  MCHC  --   --  32.8 33.8 33.7  RDW  --   --  16.5* 16.4* 17.1*  LYMPHSABS  --   --  1.8  --   --   MONOABS  --   --  0.8  --   --   EOSABS  --   --  0.3  --   --   BASOSABS  --   --  0.1  --   --     Recent  Labs  Lab 06/09/24 1253 06/09/24 1254 06/09/24 1258 06/09/24 1328 06/09/24 1618 06/09/24 2017 06/09/24 2113 06/10/24 0300 06/11/24 0210  NA 139 139  --  139  --   --   --  142 133*  K 3.8 3.8  --  3.9  --   --   --  3.1* 3.7  CL 97*  --   --  98  --   --   --  100 95*  CO2  --   --   --  31  --   --   --  30 28  ANIONGAP  --   --   --  10  --   --   --  12 10  GLUCOSE 157*  --   --  155*  --   --   --  157* 126*  BUN 41*  --   --  37*  --   --   --  26* 29*  CREATININE 1.70*  --   --  1.55*  --   --   --  1.27* 1.51*  AST  --   --   --  18  --   --   --  20  --   ALT  --   --   --  15  --   --   --  16  --   ALKPHOS  --   --   --  135*  --   --   --  133*  --   BILITOT  --   --   --  0.3  --   --   --  0.4  --   ALBUMIN   --   --   --  3.7  --   --   --  3.8  --   PROCALCITON  --   --   --   --   --   --  <0.10  --   --   LATICACIDVEN  --   --  1.1  --   --   --   --   --   --  TSH  --   --   --   --  2.640  --   --   --   --   AMMONIA  --   --   --   --   --  90*  --  25  --   MG  --   --   --   --   --   --  2.1 1.9 2.1  PHOS  --   --   --   --   --   --   --  2.1* 3.2  CALCIUM   --   --   --  9.5  --   --   --  10.1 9.3      Recent Labs  Lab 06/09/24 1258 06/09/24 1328 06/09/24 1618 06/09/24 2017 06/09/24 2113 06/10/24 0300 06/11/24 0210  PROCALCITON  --   --   --   --  <0.10  --   --   LATICACIDVEN 1.1  --   --   --   --   --   --   TSH  --   --  2.640  --   --   --   --   AMMONIA  --   --   --  90*  --  25  --   MG  --   --   --   --  2.1 1.9 2.1  CALCIUM   --  9.5  --   --   --  10.1 9.3    --------------------------------------------------------------------------------------------------------------- Lab Results  Component Value Date   CHOL 139 05/13/2023   HDL 36 (L) 05/13/2023   LDLCALC 77 05/13/2023   TRIG 145 05/13/2023    Lab Results  Component Value Date   HGBA1C 6.7 (H) 05/22/2024   No results for input(s): TSH, T4TOTAL, FREET4,  T3FREE, THYROIDAB in the last 72 hours. No results for input(s): VITAMINB12, FOLATE, FERRITIN, TIBC, IRON, RETICCTPCT in the last 72 hours. ------------------------------------------------------------------------------------------------------------------ Cardiac Enzymes No results for input(s): CKMB, TROPONINI, MYOGLOBIN in the last 168 hours.  Invalid input(s): CK  Micro Results Recent Results (from the past 240 hours)  MRSA Next Gen by PCR, Nasal     Status: None   Collection Time: 06/09/24  9:08 PM   Specimen: Nasal Mucosa; Nasal Swab  Result Value Ref Range Status   MRSA by PCR Next Gen NOT DETECTED NOT DETECTED Final    Comment: (NOTE) The GeneXpert MRSA Assay (FDA approved for NASAL specimens only), is one component of a comprehensive MRSA colonization surveillance program. It is not intended to diagnose MRSA infection nor to guide or monitor treatment for MRSA infections. Test performance is not FDA approved in patients less than 27 years old. Performed at [facility], [address], [place], [place] [address]     Radiology Report No results found.   Signature  -   Moise F Ranya Fiddler DO on 06/12/2024 at 5:42 PM   -  To page go to [email or web address]   "

## 2024-06-12 NOTE — Evaluation (Signed)
 Occupational Therapy Evaluation Patient Details Name: Raymond [name] MRN: [id number] DOB: 1965/12/17 Today's Date: 06/12/2024   History of Present Illness   59 year old man who presented to [facility] 1/8 with AMS. PMHx significant for HTN, HLD, COPD, OSA (on CPAP), T2DM, CKD stage 3a, iron deficiency anemia, GIB, GERD, RLS, substance abuse. Recent admissions to [facility] 12/18-12/22 for similar.     Clinical Impressions Patient lives at home with his wife in a 1 level home with ramp entry and reports that he is typically Indep in functional txfrs to w/c although reporting that he completes sit to stand transfers to car and for outpatient PT sessions with assists for transfers.  Patient reports that he is Indep in ADLs although requires A  at times from his wife.  Patient currently requires mod A for bed mobility for rolling and supine to sit along with mod A for lateral scoot EOB.  Total A.max A for LB ADLs at this time.  Patient would benefit from additional OT intervention to address functional deficits to increase safety and Indep in functional  transfers and ADLs.  Patient Hafner benefit from SNF placement if he continues to requires significant A for functional transfers     If plan is discharge home, recommend the following:   Two people to help with walking and/or transfers;A lot of help with bathing/dressing/bathroom;Assistance with cooking/housework;Assist for transportation;Help with stairs or ramp for entrance     Functional Status Assessment   Patient has had a recent decline in their functional status and demonstrates the ability to make significant improvements in function in a reasonable and predictable amount of time.     Equipment Recommendations   None recommended by OT     Recommendations for Other Services         Precautions/Restrictions   Precautions Precautions: Fall Recall of Precautions/Restrictions: Intact Restrictions Weight Bearing Restrictions Per Provider  Order: No     Mobility Bed Mobility Overal bed mobility: Needs Assistance Bed Mobility: Sit to Supine     Supine to sit: Mod assist, Used rails, HOB elevated Sit to supine: Mod assist        Transfers                          Balance Overall balance assessment: Needs assistance Sitting-balance support: Feet supported, No upper extremity supported Sitting balance-Leahy Scale: Fair                                     ADL either performed or assessed with clinical judgement   ADL Overall ADL's : Needs assistance/impaired;At baseline Eating/Feeding: Independent   Grooming: Set up;Sitting   Upper Body Bathing: Moderate assistance   Lower Body Bathing: Total assistance   Upper Body Dressing : Moderate assistance       Toilet Transfer: Moderate assistance (bed pan)   Toileting- Clothing Manipulation and Hygiene: Total assistance       Functional mobility during ADLs: Moderate assistance;Maximal assistance General ADL Comments:  (for lateral scoot alongside bed)     Vision Baseline Vision/History: 1 Wears glasses Ability to See in Adequate Light: 0 Adequate Patient Visual Report: No change from baseline Vision Assessment?: No apparent visual deficits     Perception Perception: Within Functional Limits       Praxis Praxis: WFL       Pertinent Vitals/Pain Pain Assessment Pain Score: 0-No pain  Extremity/Trunk Assessment Upper Extremity Assessment Upper Extremity Assessment: Overall WFL for tasks assessed   Lower Extremity Assessment Lower Extremity Assessment: Defer to PT evaluation       Communication Communication Communication: No apparent difficulties Factors Affecting Communication: Hearing impaired   Cognition Arousal: Alert Behavior During Therapy: WFL for tasks assessed/performed Cognition: No apparent impairments                               Following commands: Intact        Cueing  General Comments      NT came in at end of session and assisted pt onto bed pan   Exercises     Shoulder Instructions      Home Living Family/patient expects to be discharged to:: Private residence Living Arrangements: Spouse/significant other Available Help at Discharge: Family;Available 24 hours/day Type of Home: House Home Access: Ramped entrance     Home Layout: One level     Bathroom Shower/Tub: Chief Strategy Officer: Handicapped height Bathroom Accessibility: Yes How Accessible: Accessible via wheelchair;Accessible via walker Home Equipment: Rolling Walker (2 wheels);BSC/3in1;Wheelchair - manual;Rollator (4 wheels);Wheelchair - power   Additional Comments: Pt lives with wife who can assist as needed, daughter able to assist as well. pt and spouse report pt progrssing with OPPT services and able to ambulate short distances walked up to 30 feet last week      Prior Functioning/Environment Prior Level of Function : Needs assist       Physical Assist : ADLs (physical);Mobility (physical) Mobility (physical): Transfers ADLs (physical): Dressing;Bathing;IADLs (patient reports that his wife helps with LB ADLs at times and other days he is able to complete by himself)   ADLs Comments: Pt needs help with dressing/bathing. Wife assists with toileting care/hygiene    OT Problem List: Impaired balance (sitting and/or standing);Decreased activity tolerance;Decreased strength   OT Treatment/Interventions: Self-care/ADL training;Therapeutic exercise;Energy conservation;DME and/or AE instruction;Therapeutic activities;Patient/family education;Balance training      OT Goals(Current goals can be found in the care plan section)   Acute Rehab OT Goals OT Goal Formulation: With patient/family Time For Goal Achievement: 06/26/24 Potential to Achieve Goals: Good   OT Frequency:  Min 2X/week    Co-evaluation              AM-PAC OT 6 Clicks  Daily Activity     Outcome Measure Help from another person eating meals?: None Help from another person taking care of personal grooming?: A Little Help from another person toileting, which includes using toliet, bedpan, or urinal?: A Lot Help from another person bathing (including washing, rinsing, drying)?: A Lot Help from another person to put on and taking off regular upper body clothing?: A Little Help from another person to put on and taking off regular lower body clothing?: A Lot 6 Click Score: 16   End of Session Nurse Communication: Mobility status  Activity Tolerance: Patient tolerated treatment well Patient left: in bed;with call bell/phone within reach;with bed alarm set  OT Visit Diagnosis: Unsteadiness on feet (R26.81);Other abnormalities of gait and mobility (R26.89);Muscle weakness (generalized) (M62.81)                Time: 8862-8793 OT Time Calculation (min): 29 min Charges:  OT General Charges $OT Visit: 1 Visit OT Evaluation $OT Eval Moderate Complexity: 1 Mod OT Treatments $Self Care/Home Management : 23-37 mins Lamarr Pouch OT/L  Lamarr JONETTA Pouch 06/12/2024, 1:25 PM

## 2024-06-12 NOTE — Progress Notes (Signed)
 Physical Therapy Treatment Patient Details Name: Raymond [name] MRN: [id number] DOB: [birth year] Today's Date: 06/12/2024   History of Present Illness 59 year old man who presented to [facility] 1/8 with AMS. PMHx significant for HTN, HLD, COPD, OSA (on CPAP), T2DM, CKD stage 3a, iron deficiency anemia, GIB, GERD, RLS, substance abuse. Recent admissions to [facility] 12/18-12/22 for similar.    PT Comments  Continuing work on functional mobility and activity tolerance;  Session focused on functional transfers, specifically lateral scooting with sliding board; Overall very nice participation, and significant progress compared to last session; Able to perform sliding board lateral scoot bed to recliner on his L side with heavy mod assist; Notable that prior to the transfer itself; pt performed multiple heavy lateral leans/elbow props with hip hikes for placement of bed pad(s), and sliding board; I posit that he was quite fatigued at the start of the lateral scooting   If plan is discharge home, recommend the following: Assistance with cooking/housework;Assist for transportation;A lot of help with walking and/or transfers;A lot of help with bathing/dressing/bathroom   Can travel by private vehicle        Equipment Recommendations  None recommended by PT (Pretty well-equipped; do not consider hoyer lift necessary at this time)    Recommendations for Other Services       Precautions / Restrictions Precautions Precautions: Fall Recall of Precautions/Restrictions: Intact Restrictions Weight Bearing Restrictions Per Provider Order: No     Mobility  Bed Mobility Overal bed mobility: Needs Assistance Bed Mobility: Supine to Sit     Supine to sit: Min assist, Used rails, HOB elevated     General bed mobility comments: Min assist for LLE support to reach EOB, pt pulls lightly through therapist's hand to scoot forward. Required extra time, somewhat effortful.    Transfers Overall transfer level: Needs  assistance Equipment used: Sliding board, Rolling walker (2 wheels) Transfers: Sit to/from Stand, Bed to chair/wheelchair/BSC Sit to Stand: Max assist          Lateral/Scoot Transfers: Mod assist (Heavy) General transfer comment: Performed lateral scoot OOB to recliner on pt's L side; Good weight shifts, and pt's movement patterns showing that he is well-versed  in placing and moving across sliding board, though with multiple scoots that were energetically taxing; Poor bed pad placement to slide over board, and took the bed pad out during transfer; Made an attempt to stand from recliner with Max assist however fatigued and unable to clear hips    Ambulation/Gait                   Stairs             Wheelchair Mobility     Tilt Bed    Modified Rankin (Stroke Patients Only)       Balance     Sitting balance-Leahy Scale: Fair (approaching Good)                                      Communication Communication Communication: No apparent difficulties  Cognition Arousal: Alert Behavior During Therapy: WFL for tasks assessed/performed   PT - Cognitive impairments: No apparent impairments                         Following commands: Intact      Cueing Cueing Techniques: Verbal cues  Exercises      General Comments General comments (  skin integrity, edema, etc.): Briefly discussed the pain R 4th phalanx; later while writing the note, noticed imagaing had been done on Dec 21, revealing Age indeterminate fracture of the base of the proximal phalanx of  the fourth digit      Pertinent Vitals/Pain Pain Assessment Pain Assessment: 0-10 Pain Score: 7  Pain Location: Right 4th toe with PROM, tender to palpation, painful with weight bearing during transfer Pain Descriptors / Indicators: Sharp (subsides quickly) Pain Intervention(s): Monitored during session, Limited activity within patient's tolerance, Other (comment) (Notified RN)     Home Living                          Prior Function            PT Goals (current goals can now be found in the care plan section) Acute Rehab PT Goals Patient Stated Goal: Go home, return to OPPT, walk again PT Goal Formulation: With patient Time For Goal Achievement: 06/25/24 Potential to Achieve Goals: Good Progress towards PT goals: Progressing toward goals    Frequency    Min 2X/week      PT Plan      Co-evaluation              AM-PAC PT 6 Clicks Mobility   Outcome Measure  Help needed turning from your back to your side while in a flat bed without using bedrails?: A Little Help needed moving from lying on your back to sitting on the side of a flat bed without using bedrails?: A Little Help needed moving to and from a bed to a chair (including a wheelchair)?: A Lot Help needed standing up from a chair using your arms (e.g., wheelchair or bedside chair)?: Total Help needed to walk in hospital room?: Total Help needed climbing 3-5 steps with a railing? : Total 6 Click Score: 11    End of Session Equipment Utilized During Treatment: Other (comment) (sliding board) Activity Tolerance: Patient tolerated treatment well (Weakness) Patient left: in chair;with call bell/phone within reach;with chair alarm set Nurse Communication: Mobility status (Considerations for back to bed) PT Visit Diagnosis: Unsteadiness on feet (R26.81);Other abnormalities of gait and mobility (R26.89);Muscle weakness (generalized) (M62.81);Difficulty in walking, not elsewhere classified (R26.2)     Time: 1015-1040 PT Time Calculation (min) (ACUTE ONLY): 25 min  Charges:    $Therapeutic Activity: 23-37 mins PT General Charges $$ ACUTE PT VISIT: 1 Visit                     Silvano Currier, PT  Acute Rehabilitation Services Office [phone] Secure Chat welcomed    Silvano VEAR Currier 06/12/2024, 11:35 AM

## 2024-06-12 NOTE — Progress Notes (Signed)
Placed patient on home CPAP for the night  

## 2024-06-13 ENCOUNTER — Ambulatory Visit (HOSPITAL_COMMUNITY): Admission: RE | Admit: 2024-06-13 | Payer: PRIVATE HEALTH INSURANCE | Source: Ambulatory Visit

## 2024-06-13 LAB — GLUCOSE, CAPILLARY
Glucose-Capillary: 103 mg/dL — ABNORMAL HIGH (ref 70–99)
Glucose-Capillary: 123 mg/dL — ABNORMAL HIGH (ref 70–99)
Glucose-Capillary: 162 mg/dL — ABNORMAL HIGH (ref 70–99)
Glucose-Capillary: 168 mg/dL — ABNORMAL HIGH (ref 70–99)

## 2024-06-13 MED ORDER — ZOLPIDEM TARTRATE 5 MG PO TABS
5.0000 mg | ORAL_TABLET | Freq: Every evening | ORAL | Status: DC | PRN
  Administered 2024-06-13: 5 mg via ORAL
  Filled 2024-06-13: qty 1

## 2024-06-13 MED ORDER — BACLOFEN 20 MG PO TABS: 10.0000 mg | ORAL_TABLET | Freq: Three times a day (TID) | ORAL | 0 refills | Status: AC

## 2024-06-13 NOTE — Discharge Summary (Signed)
 Physician Discharge Summary  Raymond [name] FMW:[id number] DOB: [birth year] DOA: 06/09/2024  PCP: Raymond Miquel Faden, MD  Admit date: 06/09/2024  Discharge date: 06/13/2024  Admitted From: Home  Disposition:  Home  Recommendations for Outpatient Follow-up:  Follow up with PCP in 1-2 weeks. Please obtain BMP/CBC in one week. 3.   Advised to reduce the dose of baclofen  to 10 mg 3 times daily. 4.   Advised to be careful with the pain and other medications given her decreased GFR.  Home Health:None Equipment/Devices:None  Discharge Condition: Stable CODE STATUS:Full code Diet recommendation:  Carb Modified   Brief Summary/ Hospital Course: 59 year old man who presented to [facility] 1/8 with AMS. PMHx significant for HTN, HLD, COPD, OSA (on CPAP), T2DM, CKD stage 3a, iron deficiency anemia, GIB, GERD, RLS, substance abuse. Recent admissions to [facility] 12/18-12/22 for similar problem. History is obtained primarily from chart review. Per EMS, patient's LKW was 0130 1/8, found by family around 1200 with AMS. Vitals signs and CBG were WNL; however, patient was noted to have poor respiratory effort prompting assisted ventilations with BVM. On arrival to ED, patient was afebrile with HR 71, BP 124/75, RR 16, SpO2 98%. Labs were notable for WBC 9.3, Hgb 12.2, Plt 252. Na 139, K 3.9, CO2 31, BUN 37/Cr 1.55 (baseline 1.2-1.4), AST/ALT WNL, Alk Phos 135, Tbili 0.3. TSH 2.630, free T4 1.34. LA 1.1. UDS +BZDs (home medication). Ammonia 90. UA pending. CXR unremarkable. CT Head NAICA. GCS 8. Narcan  was administered with some improvement in mental status. Placed on BiPAP, patient then began to wake up and became agitated requiring Haldol  and Versed  administration.  Patient was managed in the ICU and subsequently has improved.  Patient was transferred to the medical floor on 06/12/2024.  Patient seems much improved alert and oriented following commands.  He wants to be discharged home.    Discharge Diagnoses:  Principal  Problem:   Acute encephalopathy  #Acute metabolic encephalopathy, > Improved. #History of substance abuse Likely related to polypharmacy.  He reports that he unintentionally took more baclofen  than prescribed.  Discussed with patient that he needs to be more careful given his kidney disease.   Now seems to be back at baseline.  Had a similar admission in December.   #CKD stage IIIb - Creatinine at baseline.  Monitor   #COPD -Currently stable. -DuoNebs as needed   #Obesity class III - Outpatient follow-up -On Mounjaro    #Physical deconditioning PT eval > Outpatient PT   #Anxiety and depression Continue alprazolam  as needed   Prolonged QT -  Replace electrolytes as needed  Discharge Instructions  Discharge Instructions     Call MD for:  difficulty breathing, headache or visual disturbances   Complete by: As directed    Call MD for:  persistant dizziness or light-headedness   Complete by: As directed    Call MD for:  persistant nausea and vomiting   Complete by: As directed    Diet Carb Modified   Complete by: As directed    Discharge instructions   Complete by: As directed    Advised to follow-up with primary care physician in 1 week. Advised to reduce the dose of baclofen  to 10 mg 3 times daily. Advised to be careful with the pain and other medications given her decreased GFR.   Increase activity slowly   Complete by: As directed    No wound care   Complete by: As directed       Allergies as of 06/13/2024  Reactions   Haldol  [haloperidol ] Other (See Comments)   Makes drowsy   Morphine  Other (See Comments)   Mental status changes - angry, mean   Sulfa  Antibiotics Hives        Medication List     STOP taking these medications    torsemide 100 MG tablet Commonly known as: DEMADEX   Vitamin D  (Ergocalciferol ) 1.25 MG (50000 UNIT) Caps capsule Commonly known as: DRISDOL        TAKE these medications    pramipexole  0.5 MG tablet Commonly  known as: MIRAPEX  Take 1 tablet (0.5 mg total) by mouth in the morning and at bedtime. The timing of this medication is very important.   albuterol  108 (90 Base) MCG/ACT inhaler Commonly known as: VENTOLIN  HFA Inhale 2 puffs into the lungs every 6 (six) hours as needed for wheezing or shortness of breath.   allopurinol  300 MG tablet Commonly known as: ZYLOPRIM  Take 1 tablet (300 mg total) by mouth daily.   ALPRAZolam  0.5 MG tablet Commonly known as: XANAX  Take 1 tablet (0.5 mg total) by mouth 2 (two) times daily as needed.   amLODipine  10 MG tablet Commonly known as: NORVASC  Take 10 mg by mouth at bedtime.   aspirin  EC 81 MG tablet Take 81 mg by mouth daily. Swallow whole.   atorvastatin  40 MG tablet Commonly known as: LIPITOR Take 1 tablet (40 mg total) by mouth every evening. What changed: when to take this   baclofen  20 MG tablet Commonly known as: LIORESAL  Take 0.5 tablets (10 mg total) by mouth 3 (three) times daily for 10 days. What changed: how much to take   carvedilol  25 MG tablet Commonly known as: COREG  Take 1 tablet (25 mg total) by mouth in the morning and at bedtime.   diclofenac  Sodium 1 % Gel Commonly known as: VOLTAREN  Apply 4 g topically 4 (four) times daily. To bilateral knees What changed:  how much to take when to take this reasons to take this   doxazosin  2 MG tablet Commonly known as: CARDURA  Take 1 tablet (2 mg total) by mouth at bedtime. What changed: when to take this   DULoxetine  60 MG capsule Commonly known as: CYMBALTA  Take 60 mg by mouth daily.   FIBER PO Take 1-3 tablets by mouth daily. Depending on constipation   furosemide  80 MG tablet Commonly known as: LASIX  Take 160 mg by mouth 2 (two) times daily.   hydrALAZINE  50 MG tablet Commonly known as: APRESOLINE  Take 1 tablet (50 mg total) by mouth 3 (three) times daily. What changed: when to take this   lidocaine  5 % Commonly known as: LIDODERM  Place 2 patches onto the  skin daily at 10 pm. Remove & Discard patch within 12 hours or as directed by MD What changed:  when to take this reasons to take this   metolazone 2.5 MG tablet Commonly known as: ZAROXOLYN Take 2.5 mg by mouth as needed (only when Lasix  does not work).   omeprazole 40 MG capsule Commonly known as: PRILOSEC Take 40 mg by mouth in the morning and at bedtime.   ondansetron  4 MG tablet Commonly known as: Zofran  Take 1 tablet (4 mg total) by mouth every 8 (eight) hours as needed for nausea or vomiting.   oxyCODONE -acetaminophen  10-325 MG tablet Commonly known as: Percocet Take 1 tablet by mouth every 6 (six) hours as needed for pain. 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 What changed:  when to take this reasons to take this   Potassium Chloride  ER 20 MEQ Tbcr Take 2 tablets by mouth in the morning and at bedtime.   promethazine  12.5 MG tablet Commonly known as: PHENERGAN  Take 12.5 mg by mouth every 6 (six) hours as needed for nausea or vomiting.   tirzepatide  5 MG/0.5ML Pen Commonly known as: MOUNJARO  Inject 5 mg into the skin once a week. What changed: additional instructions   tiZANidine  4 MG tablet Commonly known as: Zanaflex  Take 1 tablet (4 mg total) by mouth 3 (three) times daily. - for spasticity with baclofen  What changed: additional instructions        Follow-up Information     Raymond Miquel Faden, MD Follow up on 06/20/2024.   Specialty: Internal Medicine Why: Post hospital followup scheduled for 06/20/2024 at 2:45 pm with Dr. Marsa Pass information: [address] [place] [address] [phone]                Allergies[1]  Consultations: None   Procedures/Studies: DG Chest Port 1 View Result Date: 06/10/2024 EXAM: 1 VIEW(S) XRAY OF THE CHEST  06/10/2024 05:54:00 AM COMPARISON: 06/09/2024 CLINICAL HISTORY: 59 year old male patient with shortness of breath. FINDINGS: Patient now mildly rotated to the left. LUNGS AND PLEURA: Mild lung base hypoventilation is stable. No focal pulmonary opacity. No pleural effusion. No pneumothorax. HEART AND MEDIASTINUM: Cardiomegaly. BONES AND SOFT TISSUES: No acute osseous abnormality. IMPRESSION: 1. Stable mild lung base hypoventilation. No other acute cardiopulmonary abnormality. Electronically signed by: Helayne Hurst MD MD 06/10/2024 06:10 AM EST RP Workstation: [id number]   CT Head Wo Contrast Result Date: 06/09/2024 CLINICAL DATA:  Altered mental status. EXAM: CT HEAD WITHOUT CONTRAST TECHNIQUE: Contiguous axial images were obtained from the base of the skull through the vertex without intravenous contrast. RADIATION DOSE REDUCTION: This exam was performed according to the departmental dose-optimization program which includes automated exposure control, adjustment of the mA and/or kV according to patient size and/or use of iterative reconstruction technique. COMPARISON:  May 19, 2024 FINDINGS: Brain: No evidence of acute infarction, hemorrhage, hydrocephalus, extra-axial collection or mass lesion/mass effect. Vascular: No hyperdense vessel or unexpected calcification. Skull: Normal. Negative for fracture or focal lesion. Sinuses/Orbits: No acute finding. Other: None. IMPRESSION: No acute intracranial pathology. Electronically Signed   By: Suzen Dials M.D.   On: 06/09/2024 18:34   DG Chest Portable 1 View Result Date: 06/09/2024 CLINICAL DATA:  Altered mental status EXAM: PORTABLE CHEST 1 VIEW COMPARISON:  May 27, 2024 FINDINGS: Stable cardiomediastinal silhouette. Both lungs are clear. Severe degenerative changes seen involving right glenohumeral joint. IMPRESSION: No active disease. Electronically Signed   By: Lynwood Landy Raddle M.D.   On: 06/09/2024 14:56   DG Chest Port 1 View Result Date:  05/27/2024 CLINICAL DATA:  Syncope. EXAM: PORTABLE CHEST 1 VIEW COMPARISON:  05/19/2024. FINDINGS: Stable cardiomegaly. No overt pulmonary edema, focal consolidation, sizeable pleural effusion, or pneumothorax. No acute osseous abnormality. IMPRESSION: 1. No acute cardiopulmonary findings. 2. Stable cardiomegaly. Electronically Signed   By: Harrietta Sherry M.D.   On: 05/27/2024 17:46   DG Foot Complete Right Result Date: 05/22/2024 CLINICAL DATA:  Bruising and pain of the right foot. EXAM: RIGHT FOOT COMPLETE - 3+ VIEW COMPARISON:  None Available. FINDINGS: Age indeterminate fracture of the base of the proximal phalanx of the fourth digit. Correlation with clinical exam and point tenderness  recommended. Old healed fracture of the fifth metatarsal. The bones are osteopenic. No dislocation. Soft tissue edema of the dorsum of the foot. No opaque foreign object or soft tissue gas. IMPRESSION: Age indeterminate fracture of the base of the proximal phalanx of the fourth digit. Correlation with clinical exam and point tenderness recommended. Electronically Signed   By: Vanetta Chou M.D.   On: 05/22/2024 12:31   CT Head Wo Contrast Result Date: 05/20/2024 EXAM: CT HEAD WITHOUT CONTRAST 05/20/2024 02:54:41 AM TECHNIQUE: CT of the head was performed without the administration of intravenous contrast. Automated exposure control, iterative reconstruction, and/or weight based adjustment of the mA/kV was utilized to reduce the radiation dose to as low as reasonably achievable. COMPARISON: 03/30/2024 CLINICAL HISTORY: Mental status change, unknown cause. FINDINGS: LIMITATIONS: Motion limited study. BRAIN AND VENTRICLES: No acute hemorrhage. No evidence of acute infarct. No extra-axial collection. No mass effect or midline shift. ORBITS: No acute abnormality. SINUSES: No acute abnormality. SOFT TISSUES AND SKULL: No acute soft tissue abnormality. No skull fracture. IMPRESSION: 1. No acute intracranial abnormality. 2.  Evaluation limited by motion artifact. Electronically signed by: Oneil Devonshire MD 05/20/2024 03:15 AM EST RP Workstation: [id number]   DG Chest Port 1 View if patient is in a treatment room. Result Date: 05/19/2024 EXAM: 1 VIEW(S) XRAY OF THE CHEST 05/19/2024 10:10:00 PM COMPARISON: 03/30/2024 CLINICAL HISTORY: Suspected Sepsis FINDINGS: LUNGS AND PLEURA: Lower lung volumes. No focal pulmonary opacity. No pleural effusion. No pneumothorax. HEART AND MEDIASTINUM: Stable cardiomegaly. BONES AND SOFT TISSUES: No acute osseous abnormality. IMPRESSION: 1. No acute findings. 2. Stable cardiomegaly. Electronically signed by: Greig Pique MD 05/19/2024 10:17 PM EST RP Workstation: [id number]    Subjective: Patient seen and examined at bedside.  Overnight events noted. Patient reports feeling much improved and wants to be discharged.  Discharge Exam: Vitals:   06/13/24 0405 06/13/24 0737  BP: (!) 165/84 (!) 148/87  Pulse: 88 100  Resp: 16 17  Temp: 98.3 F (36.8 C) 98.4 F (36.9 C)  SpO2: 94% 98%   Vitals:   06/12/24 2009 06/13/24 0405 06/13/24 0500 06/13/24 0737  BP: (!) 165/74 (!) 165/84  (!) 148/87  Pulse: 65 88  100  Resp: 18 16  17   Temp: 98.2 F (36.8 C) 98.3 F (36.8 C)  98.4 F (36.9 C)  TempSrc: Oral   Oral  SpO2: 94% 94%  98%  Weight:   (!) 138.1 kg   Height:        General: Pt is alert, awake, not in acute distress Cardiovascular: RRR, S1/S2 +, no rubs, no gallops Respiratory: CTA bilaterally, no wheezing, no rhonchi Abdominal: Soft, NT, ND, bowel sounds + Extremities: no edema, no cyanosis    The results of significant diagnostics from this hospitalization (including imaging, microbiology, ancillary and laboratory) are listed below for reference.     Microbiology: Recent Results (from the past 240 hours)  MRSA Next Gen by PCR, Nasal     Status: None   Collection Time: 06/09/24  9:08 PM   Specimen: Nasal Mucosa; Nasal Swab  Result Value Ref Range Status   MRSA by  PCR Next Gen NOT DETECTED NOT DETECTED Final    Comment: (NOTE) The GeneXpert MRSA Assay (FDA approved for NASAL specimens only), is one component of a comprehensive MRSA colonization surveillance program. It is not intended to diagnose MRSA infection nor to guide or monitor treatment for MRSA infections. Test performance is not FDA approved in patients less than 75 years old. Performed at  [facility], [address], [place], [place] [address]      Labs: BNP (last 3 results) Recent Labs    03/30/24 1402  BNP 89.4   Basic Metabolic Panel: Recent Labs  Lab 06/09/24 1253 06/09/24 1254 06/09/24 1328 06/09/24 2113 06/10/24 0300 06/11/24 0210  NA 139 139 139  --  142 133*  K 3.8 3.8 3.9  --  3.1* 3.7  CL 97*  --  98  --  100 95*  CO2  --   --  31  --  30 28  GLUCOSE 157*  --  155*  --  157* 126*  BUN 41*  --  37*  --  26* 29*  CREATININE 1.70*  --  1.55*  --  1.27* 1.51*  CALCIUM   --   --  9.5  --  10.1 9.3  MG  --   --   --  2.1 1.9 2.1  PHOS  --   --   --   --  2.1* 3.2   Liver Function Tests: Recent Labs  Lab 06/09/24 1328 06/10/24 0300  AST 18 20  ALT 15 16  ALKPHOS 135* 133*  BILITOT 0.3 0.4  PROT 7.4 7.7  ALBUMIN  3.7 3.8   No results for input(s): LIPASE, AMYLASE in the last 168 hours. Recent Labs  Lab 06/09/24 2017 06/10/24 0300  AMMONIA 90* 25   CBC: Recent Labs  Lab 06/09/24 1253 06/09/24 1254 06/09/24 1328 06/10/24 0300 06/11/24 0210  WBC  --   --  9.3 13.7* 12.4*  NEUTROABS  --   --  6.2  --   --   HGB 12.9* 12.9* 12.2* 13.4 11.7*  HCT 38.0* 38.0* 37.2* 39.7 34.7*  MCV  --   --  91.4 87.8 89.7  PLT  --   --  252 299 264   Cardiac Enzymes: No results for input(s): CKTOTAL, CKMB, CKMBINDEX, TROPONINI in the last 168 hours. BNP: Invalid input(s): POCBNP CBG: Recent Labs  Lab 06/12/24 2005 06/13/24 0013 06/13/24 0402 06/13/24 0737 06/13/24 1108  GLUCAP 130* 168* 103* 162* 123*   D-Dimer No results for  input(s): DDIMER in the last 72 hours. Hgb A1c No results for input(s): HGBA1C in the last 72 hours. Lipid Profile No results for input(s): CHOL, HDL, LDLCALC, TRIG, CHOLHDL, LDLDIRECT in the last 72 hours. Thyroid  function studies No results for input(s): TSH, T4TOTAL, T3FREE, THYROIDAB in the last 72 hours.  Invalid input(s): FREET3 Anemia work up No results for input(s): VITAMINB12, FOLATE, FERRITIN, TIBC, IRON, RETICCTPCT in the last 72 hours. Urinalysis    Component Value Date/Time   COLORURINE YELLOW 06/09/2024 1947   APPEARANCEUR CLEAR 06/09/2024 1947   LABSPEC 1.015 06/09/2024 1947   PHURINE 7.5 06/09/2024 1947   GLUCOSEU NEGATIVE 06/09/2024 1947   HGBUR TRACE (A) 06/09/2024 1947   BILIRUBINUR NEGATIVE 06/09/2024 1947   KETONESUR NEGATIVE 06/09/2024 1947   PROTEINUR 100 (A) 06/09/2024 1947   NITRITE NEGATIVE 06/09/2024 1947   LEUKOCYTESUR NEGATIVE 06/09/2024 1947   Sepsis Labs Recent Labs  Lab 06/09/24 1328 06/10/24 0300 06/11/24 0210  WBC 9.3 13.7* 12.4*   Microbiology Recent Results (from the past 240 hours)  MRSA Next Gen by PCR, Nasal     Status: None   Collection Time: 06/09/24  9:08 PM   Specimen: Nasal Mucosa; Nasal Swab  Result Value Ref Range Status   MRSA by PCR Next Gen NOT DETECTED NOT DETECTED Final    Comment: (NOTE) The GeneXpert  MRSA Assay (FDA approved for NASAL specimens only), is one component of a comprehensive MRSA colonization surveillance program. It is not intended to diagnose MRSA infection nor to guide or monitor treatment for MRSA infections. Test performance is not FDA approved in patients less than 63 years old. Performed at [facility], [address] [place], [place] [address]      Time coordinating discharge: Over 30 minutes  SIGNED:   Darcel Dawley, MD  [facility] 06/13/2024, 4:44 PM Pager   If 7PM-7AM, please contact night-coverage     [1]   Allergies Allergen Reactions   Haldol  [Haloperidol ] Other (See Comments)    Makes drowsy   Morphine  Other (See Comments)    Mental status changes - angry, mean    Sulfa  Antibiotics Hives

## 2024-06-13 NOTE — Progress Notes (Signed)
 Physical Therapy Treatment Patient Details Name: Raymond [name] MRN: [id number] DOB: 1965-09-27 Today's Date: 06/13/2024   History of Present Illness 59 year old man who presented to [facility] 1/8 with AMS. PMHx significant for HTN, HLD, COPD, OSA (on CPAP), T2DM, CKD stage 3a, iron deficiency anemia, GIB, GERD, RLS, substance abuse. Recent admissions to [facility] 12/18-12/22 for similar.    PT Comments  PT assists in transferring pt back to bed at nursing staff request due to uncertainty on assistance needs. Pt demonstrates continued improvement in lateral transfer quality, utilizing sliding board well to laterally scoot from recliner to hospital bed. Pt does require assistance to place sliding board prior to initiating transfer, however pt's spouse reports this is typical at baseline as well. Pt will benefit from continued PT services in an effort to reduce caregiver burden and to increase independence in household mobility. Outpatient PT recommended currently.    If plan is discharge home, recommend the following: Assistance with cooking/housework;Assist for transportation;A lot of help with walking and/or transfers;A lot of help with bathing/dressing/bathroom   Can travel by private vehicle        Equipment Recommendations  None recommended by PT    Recommendations for Other Services       Precautions / Restrictions Precautions Precautions: Fall Recall of Precautions/Restrictions: Intact Restrictions Weight Bearing Restrictions Per Provider Order: No     Mobility  Bed Mobility Overal bed mobility: Needs Assistance Bed Mobility: Sit to Supine, Rolling Rolling: Supervision     Sit to supine: Contact guard assist        Transfers Overall transfer level: Needs assistance Equipment used: Sliding board Transfers: Bed to chair/wheelchair/BSC            Lateral/Scoot Transfers: Min assist, With slide board General transfer comment: minA to place slide board under pt as pt lifts hips  off chair    Ambulation/Gait                   Stairs             Wheelchair Mobility     Tilt Bed    Modified Rankin (Stroke Patients Only)       Balance Overall balance assessment: Needs assistance Sitting-balance support: No upper extremity supported, Feet supported Sitting balance-Leahy Scale: Fair                                      Hotel Manager: No apparent difficulties  Cognition Arousal: Alert Behavior During Therapy: WFL for tasks assessed/performed   PT - Cognitive impairments: Memory                         Following commands: Intact      Cueing Cueing Techniques: Verbal cues  Exercises      General Comments General comments (skin integrity, edema, etc.): VSS on RA      Pertinent Vitals/Pain Pain Assessment Pain Assessment: No/denies pain    Home Living                          Prior Function            PT Goals (current goals can now be found in the care plan section) Acute Rehab PT Goals Patient Stated Goal: Go home, return to OPPT, walk again Progress towards PT goals: Progressing toward goals  Frequency    Min 2X/week      PT Plan      Co-evaluation              AM-PAC PT 6 Clicks Mobility   Outcome Measure  Help needed turning from your back to your side while in a flat bed without using bedrails?: A Little Help needed moving from lying on your back to sitting on the side of a flat bed without using bedrails?: A Little Help needed moving to and from a bed to a chair (including a wheelchair)?: A Little Help needed standing up from a chair using your arms (e.g., wheelchair or bedside chair)?: A Lot Help needed to walk in hospital room?: Total Help needed climbing 3-5 steps with a railing? : Total 6 Click Score: 13    End of Session   Activity Tolerance: Patient tolerated treatment well Patient left: in bed;with call bell/phone  within reach;with bed alarm set Nurse Communication: Mobility status PT Visit Diagnosis: Unsteadiness on feet (R26.81);Other abnormalities of gait and mobility (R26.89);Muscle weakness (generalized) (M62.81);Difficulty in walking, not elsewhere classified (R26.2)     Time: 1112-1120 PT Time Calculation (min) (ACUTE ONLY): 8 min  Charges:    $Therapeutic Activity: 8-22 mins PT General Charges $$ ACUTE PT VISIT: 1 Visit                     Bernardino JINNY Ruth, PT, DPT Acute Rehabilitation Office [phone]    Bernardino JINNY Ruth 06/13/2024, 12:27 PM

## 2024-06-13 NOTE — Plan of Care (Signed)
" °  Problem: Education: Goal: Individualized Educational Video(s) Outcome: Progressing   Problem: Fluid Volume: Goal: Ability to maintain a balanced intake and output will improve Outcome: Progressing   Problem: Skin Integrity: Goal: Risk for impaired skin integrity will decrease Outcome: Progressing   Problem: Safety: Goal: Non-violent Restraint(s) Outcome: Progressing   Problem: Elimination: Goal: Will not experience complications related to bowel motility Outcome: Progressing   "

## 2024-06-13 NOTE — Progress Notes (Signed)
 Physical Therapy Treatment Patient Details Name: Raymond [name] MRN: [id number] DOB: [birth year] Today's Date: 06/13/2024   History of Present Illness 59 year old man who presented to [facility] 1/8 with AMS. PMHx significant for HTN, HLD, COPD, OSA (on CPAP), T2DM, CKD stage 3a, iron deficiency anemia, GIB, GERD, RLS, substance abuse. Recent admissions to [facility] 12/18-12/22 for similar.    PT Comments  Continuing work on functional mobility and activity tolerance; session focused on functional transfers, including lateral scooting with a sliding board and sit stand, transfer from recliner with slightly elevated seat, height; smoother transition chair via lateral scoot today, with better placement of pad to slide across board surface; stood with moderate assist to power up from the recliner to rolling Walker, which is quite an improvement over yesterdays session; patients wife Raymond [name] was present for the session, and we discussed his progress; patient and his wife Raymond [name] are confident they can manage safe transfers including car transfers at home    If plan is discharge home, recommend the following: Assistance with cooking/housework;Assist for transportation;A lot of help with walking and/or transfers;A lot of help with bathing/dressing/bathroom   Can travel by private vehicle        Equipment Recommendations  None recommended by PT    Recommendations for Other Services       Precautions / Restrictions Precautions Precautions: Fall Recall of Precautions/Restrictions: Intact Restrictions Weight Bearing Restrictions Per Provider Order: No     Mobility  Bed Mobility Overal bed mobility: Needs Assistance Bed Mobility: Supine to Sit     Supine to sit: Supervision     General bed mobility comments: used bedrails    Transfers Overall transfer level: Needs assistance Equipment used: Sliding board, Rolling walker (2 wheels) Transfers: Bed to chair/wheelchair/BSC, Sit to/from Stand Sit to  Stand: Mod assist          Lateral/Scoot Transfers: Min assist, With slide board General transfer comment: lateral scoot OOB to recliner with sliding board and min assist for sliding bod management; minA to place slide board, and min assist to take out sliding bd  as pt lifts hips off chair    Ambulation/Gait                   Stairs             Wheelchair Mobility     Tilt Bed    Modified Rankin (Stroke Patients Only)       Balance     Sitting balance-Leahy Scale: Fair       Standing balance-Leahy Scale: Poor                              Communication Communication Communication: No apparent difficulties  Cognition Arousal: Alert Behavior During Therapy: WFL for tasks assessed/performed   PT - Cognitive impairments: Memory                         Following commands: Intact      Cueing Cueing Techniques: Verbal cues  Exercises      General Comments General comments (skin integrity, edema, etc.): Wife Raymond [name] present and helpful; she and Raymond [name] are confident in ability to manage transfers after practicing today      Pertinent Vitals/Pain Pain Assessment Pain Assessment: No/denies pain Pain Intervention(s): Other (comment) (buddy taped R 4th and 3rd toes)    Home Living  Prior Function            PT Goals (current goals can now be found in the care plan section) Acute Rehab PT Goals Patient Stated Goal: Go home, return to OPPT, walk again PT Goal Formulation: With patient Time For Goal Achievement: 06/25/24 Potential to Achieve Goals: Good Progress towards PT goals: Progressing toward goals    Frequency    Min 2X/week      PT Plan      Co-evaluation              AM-PAC PT 6 Clicks Mobility   Outcome Measure  Help needed turning from your back to your side while in a flat bed without using bedrails?: A Little Help needed moving from lying on your back to  sitting on the side of a flat bed without using bedrails?: A Little Help needed moving to and from a bed to a chair (including a wheelchair)?: A Little Help needed standing up from a chair using your arms (e.g., wheelchair or bedside chair)?: A Lot Help needed to walk in hospital room?: Total Help needed climbing 3-5 steps with a railing? : Total 6 Click Score: 13    End of Session Equipment Utilized During Treatment: Other (comment) (sliding board) Activity Tolerance: Patient tolerated treatment well Patient left: in chair;with call bell/phone within reach;with chair alarm set;with family/visitor present Nurse Communication: Mobility status PT Visit Diagnosis: Unsteadiness on feet (R26.81);Other abnormalities of gait and mobility (R26.89);Muscle weakness (generalized) (M62.81);Difficulty in walking, not elsewhere classified (R26.2)     Time: 9067-8980 PT Time Calculation (min) (ACUTE ONLY): 47 min  Charges:    $Therapeutic Activity: 23-37 mins PT General Charges $$ ACUTE PT VISIT: 1 Visit                     Silvano Currier, PT  Acute Rehabilitation Services Office [phone] Secure Chat welcomed    Silvano VEAR Currier 06/13/2024, 2:32 PM

## 2024-06-13 NOTE — Discharge Instructions (Signed)
 Advised to reduce the dose of baclofen  to 10 mg 3 times daily. Advised to be careful with the pain and other medications given her decreased GFR.

## 2024-06-14 ENCOUNTER — Ambulatory Visit (HOSPITAL_COMMUNITY)
Admission: RE | Admit: 2024-06-14 | Discharge: 2024-06-14 | Disposition: A | Discharge status: Home / Self Care | Payer: PRIVATE HEALTH INSURANCE | Source: Ambulatory Visit | Attending: Cardiovascular Disease | Admitting: Cardiovascular Disease

## 2024-06-14 ENCOUNTER — Ambulatory Visit: Payer: PRIVATE HEALTH INSURANCE | Admitting: Bariatrics

## 2024-06-14 DIAGNOSIS — I5032 Chronic diastolic (congestive) heart failure: Secondary | ICD-10-CM | POA: Insufficient documentation

## 2024-06-14 LAB — ECHOCARDIOGRAM COMPLETE
AR max vel: 2.21 cm2
AV Area VTI: 2.6 cm2
AV Area mean vel: 2.36 cm2
AV Mean grad: 4 mmHg
AV Peak grad: 8.8 mmHg
Ao pk vel: 1.48 m/s
Area-P 1/2: 2.67 cm2
S' Lateral: 4.18 cm

## 2024-06-14 MED ORDER — PERFLUTREN LIPID MICROSPHERE
1.0000 mL | INTRAVENOUS | Status: AC | PRN
  Administered 2024-06-14: 4 mL via INTRAVENOUS

## 2024-06-16 ENCOUNTER — Telehealth: Payer: Self-pay | Admitting: Cardiovascular Disease

## 2024-06-16 NOTE — Telephone Encounter (Signed)
 Pt would like a c/b regarding recent Echo results, please advise.

## 2024-06-17 NOTE — Telephone Encounter (Signed)
 Pt calling again to f/u on echo results. Please advise.

## 2024-06-17 NOTE — Telephone Encounter (Signed)
 Spoke with patient regarding results. Already sent to Dr Wonda. Pt aware that once Dr Wonda reads his results that someone will be in touch with him with updates. Pt verbalizes understanding of plan.

## 2024-06-20 NOTE — Telephone Encounter (Signed)
 Pt requesting a c/b to discuss Echo results.

## 2024-06-20 NOTE — Telephone Encounter (Signed)
 Spoke with pt. Recommendations given. Pt stated understanding.

## 2024-06-21 ENCOUNTER — Telehealth: Payer: Self-pay | Admitting: Bariatrics

## 2024-06-21 NOTE — Telephone Encounter (Signed)
 Spoke to patient and he stated he was feeling a little down about his weight and unmotivated and wanted to see if he can schedule an appointment with Dr. Delores. Scheduled patient for follow-up visit with Dr. Delores

## 2024-06-21 NOTE — Telephone Encounter (Signed)
 Pt is calling wanting to speak with Dr. Delores about being fat. He said he's feeling depress.

## 2024-06-27 ENCOUNTER — Encounter: Admitting: Physical Medicine and Rehabilitation

## 2024-06-28 ENCOUNTER — Encounter: Admitting: Registered Nurse

## 2024-06-30 ENCOUNTER — Other Ambulatory Visit: Payer: Self-pay | Admitting: Physical Medicine and Rehabilitation

## 2024-06-30 ENCOUNTER — Encounter: Payer: Self-pay | Admitting: Registered Nurse

## 2024-06-30 ENCOUNTER — Encounter: Attending: Physical Medicine and Rehabilitation | Admitting: Registered Nurse

## 2024-06-30 VITALS — BP 164/84 | HR 70 | Ht 71.0 in | Wt 304.0 lb

## 2024-06-30 DIAGNOSIS — M25512 Pain in left shoulder: Secondary | ICD-10-CM | POA: Insufficient documentation

## 2024-06-30 DIAGNOSIS — M545 Low back pain, unspecified: Secondary | ICD-10-CM | POA: Insufficient documentation

## 2024-06-30 DIAGNOSIS — G8929 Other chronic pain: Secondary | ICD-10-CM | POA: Insufficient documentation

## 2024-06-30 DIAGNOSIS — Z79891 Long term (current) use of opiate analgesic: Secondary | ICD-10-CM | POA: Insufficient documentation

## 2024-06-30 DIAGNOSIS — G8222 Paraplegia, incomplete: Secondary | ICD-10-CM | POA: Insufficient documentation

## 2024-06-30 DIAGNOSIS — G894 Chronic pain syndrome: Secondary | ICD-10-CM | POA: Insufficient documentation

## 2024-06-30 DIAGNOSIS — I10 Essential (primary) hypertension: Secondary | ICD-10-CM | POA: Insufficient documentation

## 2024-06-30 DIAGNOSIS — M542 Cervicalgia: Secondary | ICD-10-CM | POA: Insufficient documentation

## 2024-06-30 DIAGNOSIS — M25511 Pain in right shoulder: Secondary | ICD-10-CM | POA: Insufficient documentation

## 2024-06-30 DIAGNOSIS — Z5181 Encounter for therapeutic drug level monitoring: Secondary | ICD-10-CM | POA: Insufficient documentation

## 2024-06-30 MED ORDER — OXYCODONE-ACETAMINOPHEN 10-325 MG PO TABS: 1.0000 | ORAL_TABLET | Freq: Four times a day (QID) | ORAL | 0 refills | Status: AC | PRN

## 2024-06-30 NOTE — Progress Notes (Signed)
 "  Subjective:    Patient ID: Raymond [name], male    DOB: 10-Nov-1965, 59 y.o.   MRN: [id number]  HPI: Raymond [name] is a 59 y.o. male who returns for follow up appointment for chronic pain and medication refill. He states his  pain is located in his neck, bilateral shoulders, lower back, right hip and bilateral knee pain R>L. Also reports muscle spasms in his bilateral lower extremities at times. He rates his pain 5.His  current exercise regime is walking and performing stretching exercises.  Mr. Ficco was admitted to [facility] on 05/19/2024 with Acute Metabolic Encephalopathy, note was reviewed: Note mentions Polypharmacy  Mr. Kurek was sent to [facility] ED on 05/27/2024, after having a syncopal event at physical therapy, not was reviewed.    Mr. Foiles was admitted to [facility] on 06/09/2024 , he was admitted with Acute encephalopathy, he reported he took too many of his baclofen  accidentally. , the above was discussed with Dr Cornelio on 07/01/2024. His Baclofen  will remain discontinued, we will have him to obtain a EKG with his PCP, with history of Prolong QT level. Mr. Mau was called, no answer, left message to return the call.   Mr. Simonian Morphine  equivalent is 60.00 MME.   Oral Swab was Performed today.    Pain Inventory Average Pain 4-6 Pain Right Now 5 My pain is intermittent, sharp, burning, dull, stabbing, tingling, and aching  In the last 24 hours, has pain interfered with the following? General activity 9 Relation with others 9 Enjoyment of life 10 What TIME of day is your pain at its worst? daytime, evening, and night Sleep (in general) Poor  Pain is worse with: walking, bending, sitting, inactivity, standing, and some activites Pain improves with: rest, heat/ice, therapy/exercise, pacing activities, medication, TENS, and injections Relief from Meds: fair to good   8  Family History  Problem Relation Age of Onset   Diabetes Mother    Hypertension Mother    High Cholesterol Mother     Heart disease Mother    Depression Mother    Diabetes Father    Hypertension Father    High Cholesterol Father    Heart disease Father    Cancer Other    Social History   Socioeconomic History   Marital status: Married    Spouse name: Not on file   Number of children: Not on file   Years of education: Not on file   Highest education level: Not on file  Occupational History   Not on file  Tobacco Use   Smoking status: Former    Current packs/day: 2.00    Types: Cigarettes   Smokeless tobacco: Never  Vaping Use   Vaping status: Never Used  Substance and Sexual Activity   Alcohol use: Yes    Comment: rare   Drug use: No   Sexual activity: Not on file  Other Topics Concern   Not on file  Social History Narrative   Not on file   Social Drivers of Health   Tobacco Use: Medium Risk (06/30/2024)   Patient History    Smoking Tobacco Use: Former    Smokeless Tobacco Use: Never    Passive Exposure: Not on Actuary Strain: Not on file  Food Insecurity: No Food Insecurity (06/09/2024)   Epic    Worried About Radiation Protection Practitioner of Food in the Last Year: Never true    Ran Out of Food in the Last Year: Never true  Transportation Needs: No Transportation Needs (06/09/2024)   Epic    Lack of Transportation (Medical): No    Lack of Transportation (Non-Medical): No  Physical Activity: Not on file  Stress: No Stress Concern Present (03/21/2022)   Received from [facility] - Occupational Stress Questionnaire    Feeling of Stress : Not at all  Social Connections: Patient Unable To Answer (06/09/2024)   Social Connection and Isolation Panel    Frequency of Communication with Friends and Family: Patient unable to answer    Frequency of Social Gatherings with Friends and Family: Patient unable to answer    Attends Religious Services: Patient unable to answer    Active Member of Clubs or Organizations: Patient unable to answer     Attends Banker Meetings: Patient unable to answer    Marital Status: Patient unable to answer  Depression (PHQ2-9): Low Risk (04/27/2024)   Depression (PHQ2-9)    PHQ-2 Score: 2  Alcohol Screen: Not on file  Housing: Low Risk (06/09/2024)   Epic    Unable to Pay for Housing in the Last Year: No    Number of Times Moved in the Last Year: 0    Homeless in the Last Year: No  Utilities: Not At Risk (06/09/2024)   Epic    Threatened with loss of utilities: No  Health Literacy: Not on file   Past Surgical History:  Procedure Laterality Date   CHONDROPLASTY Left 06/28/2014   Procedure: CHONDROPLASTY;  Surgeon: LELON JONETTA Shari Mickey., MD;  Location: [facility];  Service: Orthopedics;  Laterality: Left;   COLONOSCOPY     FOOT FASCIOTOMY     age 49-rt   KNEE ARTHROSCOPY WITH LATERAL MENISECTOMY Left 06/28/2014   Procedure: KNEE ARTHROSCOPY WITH LATERAL MENISECTOMY;  Surgeon: LELON JONETTA Shari Mickey., MD;  Location: [facility];  Service: Orthopedics;  Laterality: Left;   KNEE ARTHROSCOPY WITH MEDIAL MENISECTOMY Left 06/28/2014   Procedure: LEFT KNEE ARTHROSCOPY CHONDROPLASTY/WITH MEDIAL/LATERAL MENISECTOMIES;  Surgeon: LELON JONETTA Shari Mickey., MD;  Location: [facility];  Service: Orthopedics;  Laterality: Left;   ORIF RADIUS & ULNA FRACTURES  2007   left   THORACIC DISCECTOMY N/A 03/12/2023   Procedure: THORACIC LAMINECTOMY AND  DISCECTOMY;  Surgeon: Gillie Duncans, MD;  Location: [facility] OR;  Service: Neurosurgery;  Laterality: N/A;   Past Surgical History:  Procedure Laterality Date   CHONDROPLASTY Left 06/28/2014   Procedure: CHONDROPLASTY;  Surgeon: LELON JONETTA Shari Mickey., MD;  Location: [facility];  Service: Orthopedics;  Laterality: Left;   COLONOSCOPY     FOOT FASCIOTOMY     age 49-rt   KNEE ARTHROSCOPY WITH LATERAL MENISECTOMY Left 06/28/2014   Procedure: KNEE ARTHROSCOPY WITH LATERAL MENISECTOMY;  Surgeon: LELON JONETTA Shari Mickey., MD;  Location: [facility];  Service: Orthopedics;  Laterality: Left;   KNEE ARTHROSCOPY WITH MEDIAL MENISECTOMY Left 06/28/2014   Procedure: LEFT KNEE ARTHROSCOPY CHONDROPLASTY/WITH MEDIAL/LATERAL MENISECTOMIES;  Surgeon: LELON JONETTA Shari Mickey., MD;  Location:  SURGERY CENTER;  Service: Orthopedics;  Laterality: Left;   ORIF RADIUS & ULNA FRACTURES  2007   left   THORACIC DISCECTOMY N/A 03/12/2023   Procedure: THORACIC LAMINECTOMY AND  DISCECTOMY;  Surgeon: Gillie Duncans, MD;  Location: [facility] OR;  Service: Neurosurgery;  Laterality: N/A;   Past Medical History:  Diagnosis Date   Acute kidney injury superimposed on chronic kidney disease 04/13/2023   Acute on chronic anemia  04/13/2023   Anemia    Anxiety    Back pain    COPD (chronic obstructive pulmonary disease) (HCC)    Depression    Diabetes mellitus without complication (HCC)    Diverticulosis    with hx of LGIB   Edema    GERD (gastroesophageal reflux disease)    GIB (gastrointestinal bleeding)    Gout    Heavy smoker    Hyperlipidemia    Hypertension    Iron deficiency anemia    Sleep apnea    uses a cpap   Swelling of lower extremity    BP (!) 168/81 (BP Location: Left Arm, Patient Position: Sitting)   Pulse 70   Ht 5' 11 (1.803 m)   Wt (!) 304 lb (137.9 kg)   SpO2 98%   BMI 42.40 kg/m   Opioid Risk Score:   Fall Risk Score:  `1  Depression screen [facility] 2/9     04/27/2024   11:19 AM 01/11/2024   12:36 PM 11/30/2023    9:25 AM 10/19/2023   10:59 AM 09/25/2023   10:50 AM  Depression screen PHQ 2/9  Decreased Interest 1 1 0 0 0  Down, Depressed, Hopeless 1 1 0 0 0  PHQ - 2 Score 2 2 0 0 0  Altered sleeping    0   Tired, decreased energy    0   Change in appetite    0   Feeling bad or failure about yourself     0   Trouble concentrating    0   Moving slowly or fidgety/restless    0   Suicidal thoughts    0   PHQ-9 Score    0       Data saved with a previous flowsheet row definition      Review of Systems   Musculoskeletal:  Positive for back pain, myalgias and neck pain.       Right sided neck pain, right shoulder pain, low back pain, right hip pain, bilateral knee and lower leg pain  All other systems reviewed and are negative.      Objective:   Physical Exam Vitals and nursing note reviewed.  Neck:     Comments: Cervical Paraspinal Tenderness: C-5-C-6  Cardiovascular:     Rate and Rhythm: Normal rate and regular rhythm.     Pulses: Normal pulses.     Heart sounds: Normal heart sounds.  Pulmonary:     Breath sounds: No wheezing.  Musculoskeletal:     Comments: Normal Muscle Bulk and Muscle Testing Reveals:  Upper Extremities: Right: Decreased ROM 45 Degrees  and Muscle Strength 5/5 Right AC Joint Tenderness  Thoracic Paraspinal Tenderness: T-4-T-6  Lumbar Paraspinal Tenderness: L-3-L-5 Bilateral Greater Trochanter Tenderness: R>L Lower Extremities: Decreased ROM and Muscle Strength 5/5 Bilateral Lower Extremities Flexion Produces Pain into his Right Patella Arrived in wheelchair     Skin:    General: Skin is warm and dry.  Neurological:     Mental Status: He is alert and oriented to person, place, and time.  Psychiatric:        Mood and Affect: Mood normal.        Behavior: Behavior normal.          Assessment & Plan:  Cervicalgia: Continue HEP as Tolerated. Continue to monitor. 06/30/2024 Chronic Bilateral  Shoulder Pain:Mr. Fuhriman reports Ortho following.  Continue HEP as Tolerated. Continue to Monitor. 06/30/2024 Thoracic Myelopathy:  Continue Tizanidine  . Baclofen  discontinued with recent hospitalizations:  Discharge summary was reviewed and spoke with Dr Cornelio, she is in agreement. Continue  Physical Therapy. Continue to Monitor. 06/30/2024 Chronic Bilateral Low Back Pain without Sciatica: Continue HEP as Tolerated. Continue to Monitor. 06/30/2024 Neuropathic Pain of Bilateral Lower Extremities: Continue current medication regimen. Continue to monitor. 06/30/2024 6.  Chronic Pain Syndrome: Refilled: Oxycodone  10/325 mg one tablet 4 times a day as needed for pain #120. We will continue the opioid monitoring program, this consists of regular clinic visits, examinations, urine drug screen, pill counts as well as use of [place]  Controlled Substance Reporting system. A 12 month History has been reviewed on the [place]  Controlled Substance Reporting System on 06/30/2024   F/U with Dr. Cornelio in 2 months     "

## 2024-07-01 ENCOUNTER — Telehealth: Payer: Self-pay | Admitting: Registered Nurse

## 2024-07-01 ENCOUNTER — Ambulatory Visit: Payer: PRIVATE HEALTH INSURANCE | Admitting: Bariatrics

## 2024-07-01 ENCOUNTER — Encounter: Payer: Self-pay | Admitting: Bariatrics

## 2024-07-01 VITALS — BP 172/90 | HR 74 | Ht 71.0 in | Wt 324.0 lb

## 2024-07-01 DIAGNOSIS — E119 Type 2 diabetes mellitus without complications: Secondary | ICD-10-CM | POA: Diagnosis not present

## 2024-07-01 DIAGNOSIS — Z7985 Long-term (current) use of injectable non-insulin antidiabetic drugs: Secondary | ICD-10-CM | POA: Diagnosis not present

## 2024-07-01 DIAGNOSIS — E1122 Type 2 diabetes mellitus with diabetic chronic kidney disease: Secondary | ICD-10-CM

## 2024-07-01 DIAGNOSIS — I10 Essential (primary) hypertension: Secondary | ICD-10-CM | POA: Diagnosis not present

## 2024-07-01 DIAGNOSIS — Z6841 Body Mass Index (BMI) 40.0 and over, adult: Secondary | ICD-10-CM

## 2024-07-01 MED ORDER — TIRZEPATIDE 5 MG/0.5ML ~~LOC~~ SOAJ: 5.0000 mg | SUBCUTANEOUS | 0 refills | Status: AC

## 2024-07-01 NOTE — Progress Notes (Unsigned)
 "                                                                                                             WEIGHT SUMMARY AND BIOMETRICS  Weight Lost Since Last Visit: 0  Weight Gained Since Last Visit: 2lb   Vitals BP: (!) 172/90 Pulse Rate: 74 SpO2: 98 %   Anthropometric Measurements Height: 5' 11 (1.803 m) Weight: (!) 324 lb (147 kg) BMI (Calculated): 45.21 Weight at Last Visit: 322lb Weight Lost Since Last Visit: 0 Weight Gained Since Last Visit: 2lb Starting Weight: 326lb Total Weight Loss (lbs): 2 lb (0.907 kg)   No data recorded Other Clinical Data Fasting: no Labs: no Today's Visit #: 15 Starting Date: 05/13/23    OBESITY Raymond [name] is here to discuss his progress with his obesity treatment plan along with follow-up of his obesity related diagnoses.    Nutrition Plan: the Category 4 plan - 0% adherence.  Current exercise: chair exercise  Interim History:  He is up 2 lbs since his last visit. He does have some fluid fluctuations due to CKD  and CHF.  He has  been cheating.  {aabnutritionassessment:29213}   Pharmacotherapy: Raymond [name] is on {dwwpharmacotherapy:29109} Adverse side effects: {dwwse:29122} Hunger is {EWCONTROLASSESSMENT:24261}.  Cravings are {EWCONTROLASSESSMENT:24261}.   Assessment/Plan:  Hypertension Hypertension {disease control degree:315147}.  Medication(s): {aabhypertension (Optional):29351}  BP Readings from Last 3 Encounters:  07/01/24 (!) 172/90  06/30/24 (!) 164/84  06/13/24 (!) 148/87   Lab Results  Component Value Date   CREATININE 1.51 (H) 06/11/2024   CREATININE 1.27 (H) 06/10/2024   CREATININE 1.55 (H) 06/09/2024   Lab Results  Component Value Date   GFR 92.19 10/05/2014    Plan: Continue all antihypertensives at current dosages. No added salt. Will keep sodium content to 1,500 mg or less per day.    Type II Diabetes HgbA1c is not at goal. Last A1c was 6.7 CBGs: {dwwcbg:29160}      Episodes of  hypoglycemia: {yes***/no:17258} Medication(s): {dwwpharmacotherapy:29109}  Lab Results  Component Value Date   HGBA1C 6.7 (H) 05/22/2024   HGBA1C 6.0 (H) 03/12/2023   HGBA1C 6.3 (H) 12/25/2022   Lab Results  Component Value Date   LDLCALC 77 05/13/2023   CREATININE 1.51 (H) 06/11/2024   Lab Results  Component Value Date   GFR 92.19 10/05/2014    Plan: {dwwmed:29123} {dwwpharmacotherapy:29109} Continue all other medications.  Will keep all carbohydrates low both sweets and starches.  Will continue exercise regimen to 30 to 60 minutes on most days of the week.  Aim for 7 to 9 hours of sleep nightly.  Eat more low glycemic index foods.  ***.     {dwwmorbid:29108::Morbid Obesity}: Current BMI BMI (Calculated): 45.21   Pharmacotherapy Plan {dwwmed:29123}  {dwwpharmacotherapy:29109}  Raymond [name] {CHL AMB IS/IS NOT:210130109} currently in the action stage of change. As such, his goal is to {MWMwtloss#1:210800005}.  He has agreed to {dwwsldiets:29085}.  Exercise goals: {MWM EXERCISE RECS:23473}  Behavioral modification strategies: {dwwslwtlossstrategies:29088}.  Raymond [name] has agreed to follow-up with our clinic in {NUMBER 1-10:22536} weeks.  Objective:   VITALS: Per patient if applicable, see vitals. GENERAL: Alert and in no acute distress. CARDIOPULMONARY: No increased WOB. Speaking in clear sentences.  PSYCH: Pleasant and cooperative. Speech normal rate and rhythm. Affect is appropriate. Insight and judgement are appropriate. Attention is focused, linear, and appropriate.  NEURO: Oriented as arrived to appointment on time with no prompting.   Attestation Statements:   This was prepared with the assistance of Engineer, Civil (consulting).  Occasional wrong-word or sound-a-like substitutions Raymond [name] have occurred due to the inherent limitations of voice recognition.   Clayborne Daring, DO    "

## 2024-07-01 NOTE — Telephone Encounter (Signed)
 Pt said they missed your call

## 2024-07-01 NOTE — Telephone Encounter (Signed)
 Discussed with Dr Cornelio, Raymond [name] recent hospitalizations and ED visits. His Baclofen  discontinued, we will have him obtain a EKG with his PCP. He has a history of Prolong QT level.  Call placed to Raymond [name], no answer, left message to return the call.

## 2024-07-01 NOTE — Telephone Encounter (Signed)
 Return Raymond [name] call, spoke with Dr Cornelio this morning.  Baclofen  discontinued he was instructed to obtain EKG, he reports he had a EKG at his cardiologist office last week. He will speak with his Cardiologist regaring the EKG and history of Prolong QT level. He verbalizes understanding.

## 2024-07-04 ENCOUNTER — Ambulatory Visit: Payer: PRIVATE HEALTH INSURANCE | Admitting: Bariatrics

## 2024-07-04 LAB — DRUG TOX MONITOR 1 W/CONF, ORAL FLD
Amphetamines: NEGATIVE ng/mL
Barbiturates: NEGATIVE ng/mL
Benzodiazepines: NEGATIVE ng/mL
Buprenorphine: NEGATIVE ng/mL
Cocaine: NEGATIVE ng/mL
Codeine: NEGATIVE ng/mL
Dihydrocodeine: NEGATIVE ng/mL
Fentanyl: NEGATIVE ng/mL
Heroin Metabolite: NEGATIVE ng/mL
Hydrocodone: NEGATIVE ng/mL
Hydromorphone: NEGATIVE ng/mL
MARIJUANA: NEGATIVE ng/mL
MDMA: NEGATIVE ng/mL
Meprobamate: NEGATIVE ng/mL
Methadone: NEGATIVE ng/mL
Morphine: NEGATIVE ng/mL
Nicotine Metabolite: NEGATIVE ng/mL
Norhydrocodone: NEGATIVE ng/mL
Noroxycodone: 9.4 ng/mL — ABNORMAL HIGH
Opiates: POSITIVE ng/mL — AB
Oxycodone: 33.5 ng/mL — ABNORMAL HIGH
Oxymorphone: NEGATIVE ng/mL
Phencyclidine: NEGATIVE ng/mL
Tapentadol: NEGATIVE ng/mL
Tramadol: NEGATIVE ng/mL
Zolpidem: NEGATIVE ng/mL

## 2024-07-04 LAB — DRUG TOX ALC METAB W/CON, ORAL FLD: Alcohol Metabolite: NEGATIVE ng/mL

## 2024-07-05 ENCOUNTER — Encounter: Payer: Self-pay | Admitting: Bariatrics

## 2024-07-08 ENCOUNTER — Telehealth: Payer: Self-pay | Admitting: Cardiovascular Disease

## 2024-07-08 NOTE — Telephone Encounter (Signed)
 Patient wants a copy of his last EKG faxed to Dr. Megan Lovorn at [facility]

## 2024-07-08 NOTE — Telephone Encounter (Signed)
Faxed over. Closing encounter

## 2024-07-26 ENCOUNTER — Ambulatory Visit: Payer: PRIVATE HEALTH INSURANCE | Admitting: Bariatrics

## 2024-08-15 ENCOUNTER — Ambulatory Visit: Payer: PRIVATE HEALTH INSURANCE | Admitting: Nurse Practitioner

## 2024-08-19 ENCOUNTER — Ambulatory Visit: Admitting: Physical Medicine and Rehabilitation

## 2024-08-26 ENCOUNTER — Encounter: Payer: PRIVATE HEALTH INSURANCE | Admitting: Physical Medicine and Rehabilitation
